# Patient Record
Sex: Male | Born: 1935
Health system: Southern US, Community
[De-identification: ages and names within clinical notes are randomized; demographics above are authoritative.]

## PROBLEM LIST (undated history)

## (undated) DIAGNOSIS — E119 Type 2 diabetes mellitus without complications: Secondary | ICD-10-CM

## (undated) DIAGNOSIS — E785 Hyperlipidemia, unspecified: Secondary | ICD-10-CM

## (undated) DIAGNOSIS — I10 Essential (primary) hypertension: Secondary | ICD-10-CM

## (undated) DIAGNOSIS — R600 Localized edema: Secondary | ICD-10-CM

## (undated) DIAGNOSIS — I2510 Atherosclerotic heart disease of native coronary artery without angina pectoris: Secondary | ICD-10-CM

## (undated) DIAGNOSIS — I5032 Chronic diastolic (congestive) heart failure: Secondary | ICD-10-CM

## (undated) DIAGNOSIS — I639 Cerebral infarction, unspecified: Secondary | ICD-10-CM

## (undated) DIAGNOSIS — M4800 Spinal stenosis, site unspecified: Secondary | ICD-10-CM

## (undated) DIAGNOSIS — E039 Hypothyroidism, unspecified: Secondary | ICD-10-CM

## (undated) HISTORY — PX: HEMORRHOID SURGERY: SHX153

## (undated) HISTORY — PX: APPENDECTOMY: SHX54

## (undated) HISTORY — DX: Hypothyroidism, unspecified: E03.9

## (undated) HISTORY — PX: TOE SURGERY: SHX1073

## (undated) HISTORY — DX: Chronic diastolic (congestive) heart failure: I50.32

## (undated) HISTORY — DX: Type 2 diabetes mellitus without complications: E11.9

## (undated) HISTORY — PX: CHOLECYSTECTOMY: SHX55

## (undated) HISTORY — DX: Essential (primary) hypertension: I10

## (undated) HISTORY — DX: Spinal stenosis, site unspecified: M48.00

## (undated) HISTORY — PX: PROSTATE BIOPSY: SHX241

## (undated) HISTORY — PX: CORNEAL TRANSPLANT: SHX108

## (undated) HISTORY — DX: Localized edema: R60.0

## (undated) HISTORY — DX: Hyperlipidemia, unspecified: E78.5

## (undated) HISTORY — DX: Atherosclerotic heart disease of native coronary artery without angina pectoris: I25.10

---

## 1992-11-21 HISTORY — PX: CORONARY ARTERY BYPASS GRAFT: SHX141

## 2004-10-20 ENCOUNTER — Ambulatory Visit: Payer: Self-pay | Admitting: *Deleted

## 2004-12-29 ENCOUNTER — Ambulatory Visit: Payer: Self-pay

## 2004-12-31 ENCOUNTER — Ambulatory Visit: Payer: Self-pay

## 2005-01-06 ENCOUNTER — Ambulatory Visit: Payer: Self-pay | Admitting: *Deleted

## 2005-03-08 ENCOUNTER — Ambulatory Visit: Payer: Self-pay | Admitting: Gastroenterology

## 2005-05-05 ENCOUNTER — Ambulatory Visit: Payer: Self-pay | Admitting: Gastroenterology

## 2005-10-19 ENCOUNTER — Ambulatory Visit: Payer: Self-pay | Admitting: *Deleted

## 2005-10-26 ENCOUNTER — Ambulatory Visit: Payer: Self-pay | Admitting: *Deleted

## 2005-11-03 ENCOUNTER — Ambulatory Visit: Payer: Self-pay

## 2007-03-05 ENCOUNTER — Ambulatory Visit: Payer: Self-pay | Admitting: Unknown Physician Specialty

## 2007-03-22 ENCOUNTER — Ambulatory Visit: Payer: Self-pay | Admitting: Unknown Physician Specialty

## 2007-04-22 ENCOUNTER — Ambulatory Visit: Payer: Self-pay | Admitting: Unknown Physician Specialty

## 2007-04-25 ENCOUNTER — Ambulatory Visit: Payer: Self-pay | Admitting: *Deleted

## 2007-05-24 ENCOUNTER — Ambulatory Visit: Payer: Self-pay | Admitting: Urology

## 2007-07-16 ENCOUNTER — Ambulatory Visit: Payer: Self-pay | Admitting: Cardiology

## 2007-07-25 ENCOUNTER — Ambulatory Visit: Payer: Self-pay | Admitting: Ophthalmology

## 2007-07-27 ENCOUNTER — Ambulatory Visit: Payer: Self-pay | Admitting: Ophthalmology

## 2007-11-08 ENCOUNTER — Ambulatory Visit: Payer: Self-pay

## 2007-12-24 ENCOUNTER — Ambulatory Visit: Payer: Self-pay | Admitting: Cardiology

## 2008-10-21 ENCOUNTER — Ambulatory Visit: Payer: Self-pay | Admitting: Orthopedic Surgery

## 2008-10-28 ENCOUNTER — Ambulatory Visit: Payer: Self-pay | Admitting: Orthopedic Surgery

## 2009-01-06 ENCOUNTER — Ambulatory Visit: Payer: Self-pay | Admitting: Cardiology

## 2009-04-02 ENCOUNTER — Encounter: Payer: Self-pay | Admitting: Cardiology

## 2009-05-05 ENCOUNTER — Ambulatory Visit: Payer: Self-pay | Admitting: Vascular Surgery

## 2009-05-21 ENCOUNTER — Ambulatory Visit: Payer: Self-pay | Admitting: Family Medicine

## 2009-06-07 DIAGNOSIS — I10 Essential (primary) hypertension: Secondary | ICD-10-CM

## 2009-06-07 DIAGNOSIS — I2510 Atherosclerotic heart disease of native coronary artery without angina pectoris: Secondary | ICD-10-CM | POA: Insufficient documentation

## 2009-06-07 DIAGNOSIS — E785 Hyperlipidemia, unspecified: Secondary | ICD-10-CM

## 2009-06-07 DIAGNOSIS — I25810 Atherosclerosis of coronary artery bypass graft(s) without angina pectoris: Secondary | ICD-10-CM

## 2009-06-09 ENCOUNTER — Ambulatory Visit: Payer: Self-pay | Admitting: Cardiology

## 2009-06-09 DIAGNOSIS — R0602 Shortness of breath: Secondary | ICD-10-CM

## 2009-06-17 ENCOUNTER — Encounter: Payer: Self-pay | Admitting: Cardiology

## 2009-06-17 ENCOUNTER — Ambulatory Visit: Payer: Self-pay | Admitting: Internal Medicine

## 2009-06-17 ENCOUNTER — Ambulatory Visit: Payer: Self-pay | Admitting: Cardiology

## 2009-06-23 ENCOUNTER — Telehealth: Payer: Self-pay | Admitting: Cardiology

## 2010-10-06 ENCOUNTER — Encounter: Payer: Self-pay | Admitting: Cardiology

## 2011-04-05 NOTE — Assessment & Plan Note (Signed)
[facility] OFFICE NOTE   NAME:Boudreau, BILLY TURVEY                    MRN:          [id number]  DATE:01/06/2009                            DOB:          06/19/1925    Mr. Hodgkin comes in today for followup.  He has no complaints  including no angina, ischemic equivalent symptoms, orthopnea, PND, tachy  palpitations, syncope, or presyncope.   PROBLEM LIST:  1. Coronary artery disease.  He is status post coronary artery bypass      grafting by Dr. Particia Lather in 1994.  He has a stress Myoview on      November 08, 2007, which showed a ejection fraction of 60% with no      ischemia.  2. Hypertension.  3. Hyperlipidemia.  4. Type 2 diabetes.  5. Hypothyroidism, followed by Dr. Sullivan Lone.   He has a history of asymptomatic sinus bradycardia.  His last heart rate  is being 48 in February 2009.   CURRENT MEDICATIONS:  1. Metoprolol tartrate 25 mg p.o. b.i.d.  2. Levothyroxine 25 mcg per day.  3. Vitamin D 50,000 units every week.  4. Lisinopril 40 mg a day.  5. Aspirin 81 mg a day.  6. Lantus insulin nightly.  7. Insulin NovoLog t.i.d.  8. Omeprazole 20 mg daily.  9. HCTZ 12.5 mg per day.  10.Zocor 40 mg nightly.   PHYSICAL EXAMINATION:  VITAL SIGNS:  His blood pressure is 162/70.  His  pulse is 53.  His weight is 210 up 10.  HEENT:  Essentially unremarkable.  NECK:  Supple.  Carotid upstrokes were equal bilateral without bruits.  No JVD.  Thyroid is not enlarged.  Trachea is midline.  LUNGS:  Clear to auscultation and percussion.  Thoracotomy is stable.  HEART:  Soft S1 and S2.  No murmur, rub, or gallop.  ABDOMEN:  Soft.  Good bowel sounds.  No midline bruit.  No pulsatile  mass.  EXTREMITIES:  No edema.  Pulses are present.  NEUROLOGIC:  Intact.   He is in a sinus brady.  He has a left anterior fascicular block and  LVH, which is unchanged.   ASSESSMENT AND PLAN:  Mr. Puskas is stable from my  standpoint.  We  have made no change in medical program.  He will need to watch his blood  pressure.  I will see him back in a year at which time we will probably  obtain objective assessment of his coronary artery disease and his  previous surgery.     Thomas C. Daleen Squibb, MD, [facility]  Electronically Signed    TCW/MedQ  DD: 01/06/2009  DT: 01/07/2009  Job #: [id number]   cc:   Julieanne Manson

## 2011-04-05 NOTE — Assessment & Plan Note (Signed)
[facility] OFFICE NOTE   NAME:Rundle, DEMETRICK EICHENBERGER                    MRN:          [id number]  DATE:07/16/2007                            DOB:          1925/08/02    Mr. Hehn comes today, referred by Dr. Sullivan Lone, for leg discomfort  and fatigue.   He has been a former patient of Dr. Glennon Hamilton.  His past medical  history is significant for coronary artery bypass grafting in 1994 by  Dr. Particia Lather.  At that time, he had eight grafts placed.  The  graft placements are outlined on the previous note of December 29, 2004  by [employer], PA-C.   He has type II diabetes and recently went on insulin.  He also has  hyperlipidemia and hypertension.  Blood work was recently checked by Dr.  Sullivan Lone.  He is on an excellent medical program.   His leg discomfort is described as just aching all over.  He has been  walking, which does not exacerbate it.  He recently had an evaluation by  Dr. Jenene Slicker, which did not show any significant circulation or PAD  issues.  Dr. Sullivan Lone has confirmed this by phone today.   He hurts mostly with a burning and aching in the middle part of his  thighs.   CURRENT MEDICATIONS:  1. Toprol XL 50 mg a day.  2. Zocor 40 mg nightly.  3. HCTZ 12.5 mg daily.  4. Omeprazole 20 mg a day.  5. Insulin NovoLog t.i.d.  6. Lantus insulin nightly.  7. Altace 10 mg a day.  8. Aspirin 81 mg a day.   PHYSICAL EXAMINATION:  His blood pressure is 142/60.  His pulse 56 and  regular.  His weight is 200 pounds, up 3.  HEENT:  Normocephalic and atraumatic.  PERRLA.  Extraocular movements  are intact.  Sclerae are clear.  Facial symmetry is normal.  NECK:  Carotid upstrokes are equal bilaterally without bruits.  No JVD.  Thyroid is not enlarged.  Trachea is midline.  HEART:  A nondisplaced PMI.  Normal S1 and S2.  LUNGS:  Clear.  ABDOMEN:  Protuberant with good bowel sounds.  There is no  hepatomegaly.  EXTREMITIES:  No clubbing, cyanosis or edema.  Pulses are present.  NEUROLOGIC:  Intact.   I went over carefully any ischemic symptoms he might be having.  I do  not get the feeling that he is having any of that at present.  His  stress Myoview last was November 03, 2005, which showed an EF of 58%  with no sign of scar or ischemia.   ASSESSMENT/PLAN:  1. Neuralgia paresthetica.  2. No history of or symptoms suggestive of peripheral vascular      disease.  3. Coronary artery disease, currently stable.  4. Insulin-dependent diabetes.  5. Hypertension.  6. Hyperlipidemia.   I have encouraged Mr. Gover to lose weight and to tightly control his  blood sugar.  I have also encouraged regular exercise.  I reassured him  that his peripheral vasculature is in good shape.  Will set  him up for a  stress Myoview late this year for a two-year followup.  I will see him  back in January, 2009.     Thomas C. Daleen Squibb, MD, [facility]  Electronically Signed    TCW/MedQ  DD: 07/16/2007  DT: 07/16/2007  Job #: [id number]   cc:   Julieanne Manson

## 2011-04-05 NOTE — Assessment & Plan Note (Signed)
[facility] OFFICE NOTE   NAME:Vancuren, WHITFIELD DULAY                    MRN:          [id number]  DATE:12/24/2007                            DOB:          Oct 11, 1925    Mr. Crossen returns today for further management of his coronary  disease.   Had a stress Myoview November 08, 2007, which showed an EF of 60% with  no ischemia.  His grafts are now nearly 75 years old.  At that time he  had 8 grafts placed by Dr. Andrey Campanile.   He is having no symptoms of ischemia or angina.  His blood pressure,  lipids, and blood sugar all been followed by Dr. Sullivan Lone.   His blood pressure has been good at home.  Today he is up to 166/70,  pulse is about 60 and regular.  HEENT:  Unchanged.  Carotids are full without bruits.  Thyroid is not enlarged.  Trachea is  midline.  Lungs were clear.  HEART:  Reveals a nondisplaced PMI.  Normal S1, S2.  Slow rate.  Abdominal exam is soft, good bowel sounds.  No midline bruit.  EXTREMITIES:  No edema.  Pulses are intact.   He has EKG showing marked sinus bradycardia, rate of 48 beats per  minute.  Left anterior fascicular block.  He has LVH with strain and  repolarization changes.  This is stable.   Mr. Statz is doing well.  He becomes more bradycardic we could cut  his Toprol back 25 mg a day.  At the present time will leave him where  he is since he is doing so well.  I will see him back again in December  2009.  With his grafts being as old as they are he should probably have  an annual surveillance nuclear study.  We will address this when he  returns.     Thomas C. Daleen Squibb, MD, [facility]  Electronically Signed    TCW/MedQ  DD: 12/24/2007  DT: 12/24/2007  Job #: [id number]   cc:   Julieanne Manson

## 2011-04-05 NOTE — Letter (Signed)
April 25, 2007    Julieanne Manson  [address], [address]  [place],  [place] [address]   RE:  DUELL, HOLDREN  MRN:  [id number]  /  DOB:  13-Nov-1925   Dear Louanne Skye:   It was a pleasure to see your nice patient, Joseph [name], for  followup on April 25, 2007.   As you know, he is a very pleasant 77 year old white male with coronary  artery disease 14 years post coronary artery bypass graft surgery by Dr.  Andrey Campanile. The patient has diabetes, hyperlipidemia, and hypertension. He  has been getting along quite well without cardiac symptoms. Most recent  stress Myoview was November 03, 2005, there was no evidence of scar or  ischemia. He had previous abdominal ultrasound revealing no abdominal  aneurysm.   His present medications include insulin, omeprazole 20, aspirin 325,  hydrochlorothiazide 12.5, Zocor 40, Toprol XL 50, Altace 10 and  metformin.   Blood pressure was 155/67, pulse 62 in normal sinus rhythm.  GENERAL APPEARANCE: Normal. JVP is not elevated. Carotid pulse palpable  and equal without bruits.  LUNGS:  Clear.  CARDIAC: Normal. No murmur, gallop.  ABDOMEN: Normal.  EXTREMITIES: Normal.   I have suggested increasing the Altace to 20 mg daily. We will plan to  follow a BMP and lipid and LFTs and I will have him followup with Dr.  Daleen Squibb in four months in the [place] office. I should note the EKG  reveals sinus bradycardia, left anterior fascicular block. Thank you for  the opportunity to share in this nice pleasant gentleman's care.    Sincerely,      E. Graceann Congress, MD, [facility]  Electronically Signed    EJL/MedQ  DD: 04/25/2007  DT: 04/25/2007  Job #: [id number]

## 2011-05-09 ENCOUNTER — Encounter: Payer: Self-pay | Admitting: Cardiology

## 2011-08-24 ENCOUNTER — Encounter: Payer: Self-pay | Admitting: Cardiovascular Disease

## 2011-09-05 ENCOUNTER — Encounter: Payer: Self-pay | Admitting: Cardiovascular Disease

## 2011-09-07 ENCOUNTER — Institutional Professional Consult (permissible substitution): Payer: Self-pay | Admitting: Cardiovascular Disease

## 2011-09-14 ENCOUNTER — Ambulatory Visit (INDEPENDENT_AMBULATORY_CARE_PROVIDER_SITE_OTHER): Payer: Medicare Other | Admitting: Cardiovascular Disease

## 2011-09-14 ENCOUNTER — Telehealth: Payer: Self-pay | Admitting: *Deleted

## 2011-09-14 ENCOUNTER — Encounter: Payer: Self-pay | Admitting: *Deleted

## 2011-09-14 ENCOUNTER — Encounter: Payer: Self-pay | Admitting: Cardiovascular Disease

## 2011-09-14 DIAGNOSIS — E119 Type 2 diabetes mellitus without complications: Secondary | ICD-10-CM

## 2011-09-14 DIAGNOSIS — I25810 Atherosclerosis of coronary artery bypass graft(s) without angina pectoris: Secondary | ICD-10-CM

## 2011-09-14 DIAGNOSIS — E785 Hyperlipidemia, unspecified: Secondary | ICD-10-CM

## 2011-09-14 DIAGNOSIS — R5383 Other fatigue: Secondary | ICD-10-CM

## 2011-09-14 DIAGNOSIS — I10 Essential (primary) hypertension: Secondary | ICD-10-CM

## 2011-09-14 DIAGNOSIS — R0602 Shortness of breath: Secondary | ICD-10-CM

## 2011-09-14 NOTE — Telephone Encounter (Signed)
Pt's wife stated that he has not been taking either Crestor or Simvastatin. They were told to call back to clarify, so that Dr. Mariah Milling could send in Rx that he recommended to [facility] pharmacy on [address].

## 2011-09-14 NOTE — Patient Instructions (Addendum)
Please decrease the metoprolol to 1/2 twice a day  Your physician has requested that you have a lexiscan myoview. For further information please visit [email or web address] Please follow instruction sheet, as given.  Please call us if you have new issues that need to be addressed before your next appt.  The office will contact you for a follow up Appt. In 1 months

## 2011-09-14 NOTE — Progress Notes (Signed)
   Patient ID: Joseph [name], male    DOB: 09-18-1925, 77 y.o.   MRN: [id number]  HPI Comments: Joseph [name] with a hx of CAD, CABG in 2006, PAD with reported stenosis in the past requiring intervention (details unavailable), obesity, hyperlipidemia, arthritis of the knees, who presents by referral from Dr. Sullivan Lone for increasing fatigue, SOB, leg weakness over the past 6 months.  He reports that he is unable to do anything without having to rest. He can only walk a short distance. Prior to hiss in 2006, he was having heaviness in the chest. He is not having this heaviness currently. His weight has significantly increased. His wife reports that he is unable to do much around the house. He does a small chore  "and then has to sit down". "My legs are very heavy" and breathing is short.  EKG shows sinus bradycardia rate 48 beats per minute with left anterior fascicular block, poor R-wave progression through the anterior precordial leads, nonspecific ST abnormality, possible LVH with repolarization abn     Review of Systems  Constitutional: Positive for fatigue.  HENT: Negative.   Eyes: Negative.   Respiratory: Positive for shortness of breath.   Cardiovascular: Negative.   Gastrointestinal: Negative.   Musculoskeletal: Negative.        Legs are heavy  Skin: Negative.   Neurological: Positive for weakness.  Hematological: Negative.   Psychiatric/Behavioral: Negative.   All other systems reviewed and are negative.      Physical Exam  Nursing note and vitals reviewed. Constitutional: He is oriented to person, place, and time. He appears well-developed and well-nourished.  HENT:  Head: Normocephalic.  Nose: Nose normal.  Mouth/Throat: Oropharynx is clear and moist.  Eyes: Conjunctivae are normal. Pupils are equal, round, and reactive to light.  Neck: Normal range of motion. Neck supple. No JVD present.  Cardiovascular: Normal rate, regular rhythm, S1 normal, S2 normal, normal heart  sounds and intact distal pulses.  Exam reveals decreased pulses. Exam reveals no gallop and no friction rub.   No murmur heard. Pulses:      Carotid pulses are 2+ on the right side, and 2+ on the left side.      Radial pulses are 2+ on the right side, and 2+ on the left side.       Dorsalis pedis pulses are 0 on the right side, and 0 on the left side.       Posterior tibial pulses are 0 on the right side, and 0 on the left side.  Pulmonary/Chest: Effort normal and breath sounds normal. No respiratory distress. He has no wheezes. He has no rales. He exhibits no tenderness.  Abdominal: Soft. Bowel sounds are normal. He exhibits no distension. There is no tenderness.  Musculoskeletal: Normal range of motion. He exhibits no edema and no tenderness.  Lymphadenopathy:    He has no cervical adenopathy.  Neurological: He is alert and oriented to person, place, and time. Coordination normal.  Skin: Skin is warm and dry. No rash noted. No erythema.  Psychiatric: He has a normal mood and affect. His behavior is normal. Judgment and thought content normal.           Assessment and Plan

## 2011-09-15 DIAGNOSIS — E1151 Type 2 diabetes mellitus with diabetic peripheral angiopathy without gangrene: Secondary | ICD-10-CM | POA: Insufficient documentation

## 2011-09-15 DIAGNOSIS — R5383 Other fatigue: Secondary | ICD-10-CM | POA: Insufficient documentation

## 2011-09-15 DIAGNOSIS — R531 Weakness: Secondary | ICD-10-CM | POA: Insufficient documentation

## 2011-09-15 NOTE — Assessment & Plan Note (Addendum)
Uncertain if his fatigue and shortness of breath his underlying cardiac issues. Other options would include his low testosterone, the increase in weight and general deconditioning, Depression or worsening claudication in his legs. We will discuss with him in followup whether he needs a lower extremity Doppler.

## 2011-09-15 NOTE — Telephone Encounter (Signed)
Do we have samples of vytorin 10/40 he could try? With a script

## 2011-09-15 NOTE — Assessment & Plan Note (Signed)
Blood pressure is well controlled on today's visit. No changes made to the medications. 

## 2011-09-15 NOTE — Assessment & Plan Note (Signed)
No recent evaluation. Stress test has been ordered. Would suggest continued aggressive medical management.

## 2011-09-15 NOTE — Assessment & Plan Note (Signed)
Hemoglobin A1c 7.1.  We have encouraged continued exercise, careful diet management in an effort to lose weight.

## 2011-09-15 NOTE — Assessment & Plan Note (Signed)
Given his history of coronary artery disease, I'm concerned about his shortness of breath and fatigue. We will schedule him for a Myoview at [facility]. If this is unrevealing, we could have him complete an echocardiogram to exclude elevated right ventricular systolic pressures and the need for diuresis.

## 2011-09-15 NOTE — Telephone Encounter (Signed)
Notified pt's wife of msg below. Pt will start Vytorin 10/40 samples and call us in about a week with update. If he tolerates, we will send in Rx.

## 2011-09-15 NOTE — Assessment & Plan Note (Signed)
Current cholesterol is elevated and he has stopped taking his statin. He was changed from simvastatin 80 mg daily secondary to knee pain to Crestor. He continued to have knee pain and now does not take any cholesterol medication. One option would be to try Vytorin 10/40 mg daily .

## 2011-09-22 ENCOUNTER — Ambulatory Visit: Payer: Self-pay | Admitting: Cardiovascular Disease

## 2011-09-22 DIAGNOSIS — R072 Precordial pain: Secondary | ICD-10-CM

## 2011-09-27 ENCOUNTER — Telehealth: Payer: Self-pay | Admitting: *Deleted

## 2011-09-27 DIAGNOSIS — I25810 Atherosclerosis of coronary artery bypass graft(s) without angina pectoris: Secondary | ICD-10-CM

## 2011-09-27 DIAGNOSIS — R0602 Shortness of breath: Secondary | ICD-10-CM

## 2011-09-27 NOTE — Telephone Encounter (Signed)
Error

## 2011-10-03 ENCOUNTER — Encounter: Payer: Self-pay | Admitting: Cardiovascular Disease

## 2011-10-06 ENCOUNTER — Encounter: Payer: Self-pay | Admitting: Cardiovascular Disease

## 2011-10-07 ENCOUNTER — Telehealth: Payer: Self-pay | Admitting: *Deleted

## 2011-10-07 NOTE — Telephone Encounter (Signed)
Pt notified of myoview results, low risk scan overall normal. Pt has f/u with Dr. Mariah Milling 10/17/11. After discussing w/ pt, he asks if that f/u is not needed he may want to move back b/c his breathing he thinks has improved. I told him that per note if myoview normal TG recc echo to r/o reason for dyspnea. He says if Dr. Mariah Milling does not think this is urgent since dysp improved, he would like to hold off. Dr. Sullivan Lone is going to supplement testosterone due to low findings, this may help with his fatigue. I told pt I will put him on 6 mo recall and will forward to TG to see if he has further rec's, otherwise pt will call with any changes.

## 2011-10-09 NOTE — Telephone Encounter (Signed)
Can delay echo if SOB improving. Routine follow up 6 months. Testosterone may help

## 2011-10-10 NOTE — Telephone Encounter (Signed)
Spoke to pt's wife, notified of below. Echo and f/u on recall for 6 months. If pt has no SOB, he will not choose to do echo, will still f/u in 6 months.

## 2011-10-17 ENCOUNTER — Ambulatory Visit: Payer: Medicare Other | Admitting: Cardiovascular Disease

## 2012-06-20 ENCOUNTER — Ambulatory Visit: Payer: Self-pay | Admitting: Family Medicine

## 2012-06-21 ENCOUNTER — Encounter: Payer: Self-pay | Admitting: Cardiovascular Disease

## 2012-06-21 ENCOUNTER — Ambulatory Visit (INDEPENDENT_AMBULATORY_CARE_PROVIDER_SITE_OTHER): Payer: Medicare Other | Admitting: Cardiovascular Disease

## 2012-06-21 VITALS — BP 152/68 | HR 49 | Ht 67.0 in | Wt 220.5 lb

## 2012-06-21 DIAGNOSIS — I10 Essential (primary) hypertension: Secondary | ICD-10-CM

## 2012-06-21 DIAGNOSIS — E119 Type 2 diabetes mellitus without complications: Secondary | ICD-10-CM

## 2012-06-21 DIAGNOSIS — R002 Palpitations: Secondary | ICD-10-CM

## 2012-06-21 DIAGNOSIS — R609 Edema, unspecified: Secondary | ICD-10-CM | POA: Insufficient documentation

## 2012-06-21 DIAGNOSIS — E785 Hyperlipidemia, unspecified: Secondary | ICD-10-CM

## 2012-06-21 DIAGNOSIS — R0602 Shortness of breath: Secondary | ICD-10-CM

## 2012-06-21 DIAGNOSIS — I25810 Atherosclerosis of coronary artery bypass graft(s) without angina pectoris: Secondary | ICD-10-CM

## 2012-06-21 DIAGNOSIS — R000 Tachycardia, unspecified: Secondary | ICD-10-CM

## 2012-06-21 MED ORDER — POTASSIUM CHLORIDE ER 10 MEQ PO TBCR: 10.0000 meq | EXTENDED_RELEASE_TABLET | Freq: Every day | ORAL | Status: DC | PRN

## 2012-06-21 NOTE — Patient Instructions (Addendum)
You are doing well.  Please start the lasix one a day Take a potassium pill with the lasix  We will schedule you an echocardiogram to look at the heart and reason for edema  Please call us if you have new issues that need to be addressed before your next appt.  Your physician wants you to follow-up in: 4 to 5 weeks

## 2012-06-21 NOTE — Assessment & Plan Note (Signed)
Pitting edema noted. We have suggested he have an echocardiogram. Uncertain if in the setting of new EKG changes, whether this is systolic or diastolic CHF or a combination of both. We have certainly recommended he continue with Dr. Wonda Olds advice and start Lasix daily. We will add potassium 10 mEq a day, though he could certainly substitute with a banana which he frequently does. We have suggested he decrease the frequency of the Lasix once his edema improves.

## 2012-06-21 NOTE — Assessment & Plan Note (Signed)
Recheck of his blood pressure today was improved. Diuresis should also help with blood pressure.

## 2012-06-21 NOTE — Progress Notes (Signed)
Patient ID: Joseph [name], male    DOB: July 12, 1925, 78 y.o.   MRN: [id number]  HPI Comments: Joseph [name] with a hx of CAD, CABG in 2006, PAD with reported stenosis in the past requiring intervention (details unavailable),  followed by Dr.  Wyn Quaker, obesity, hyperlipidemia, arthritis of the knees, who previously presented with increasing fatigue, SOB, leg weakness, now presenting with lower extremity edema.  He is a patient of Dr. Sullivan Lone.   He reports he was seen yesterday by Dr. Sullivan Lone and was started on Lasix. He has not filled the medication yet. He reports having worsening lower extremity edema over the past month. Chest x-ray did not show CHF or pneumonia. Lab work done showed normal renal function and LFTs. He does have hemoglobin A1c 7.8, total cholesterol 201 His wife reports that he has not been taking his cholesterol medication.  He has noticed it for the putting his shoes on. He has not been eating anything different, no salt or heavy fluid intake. In fact his wife reports that he does not drink very much, only half a glass of ice tea or coffee at a time. He has been having sputum, coughing at nighttime and is using 3 pillows to sleep. He denies any worsening shortness of breath or chest pain. His wife reports that he is not very active at baseline.  EKG shows sinus bradycardia rate 49 beats per minute with left anterior fascicular block, poor R-wave progression through the anterior precordial leads, nonspecific ST abnormality in leads V4 through V6 (more prominent than in October 2012)   Outpatient Encounter Prescriptions as of 06/21/2012  Medication Sig Dispense Refill  . aspirin (ASPIR-81) 81 MG EC tablet Take 81 mg by mouth daily.        . furosemide (LASIX) 20 MG tablet Take 20 mg by mouth daily.      . hydrochlorothiazide (HYDRODIURIL) 50 MG tablet Take 50 mg by mouth daily.        . insulin aspart (NOVOLOG) 100 UNIT/ML injection Inject into the skin 3 (three) times daily before  meals. 26-16-28       . insulin glargine (LANTUS) 100 UNIT/ML injection Inject 60 Units into the skin at bedtime.       . IRON CR PO Take 65 mg by mouth at bedtime.      . Levothyroxine Sodium 25 MCG CAPS Take by mouth daily.        Marland Kitchen lisinopril (PRINIVIL,ZESTRIL) 40 MG tablet Take 40 mg by mouth 2 (two) times daily.        . metoprolol tartrate (LOPRESSOR) 25 MG tablet Take 25 mg by mouth 2 (two) times daily.      Marland Kitchen omeprazole (PRILOSEC) 20 MG capsule Take 20 mg by mouth daily.        Marland Kitchen ezetimibe-simvastatin (VYTORIN) 10-40 MG per tablet Take 1 tablet by mouth at bedtime.  30 tablet  6  . potassium chloride (K-DUR) 10 MEQ tablet Take 1 tablet (10 mEq total) by mouth daily as needed. Take with lasix  30 tablet  6    Review of Systems  HENT: Negative.   Eyes: Negative.   Cardiovascular: Positive for leg swelling.  Gastrointestinal: Negative.   Musculoskeletal: Negative.        Legs are heavy  Skin: Negative.   Hematological: Negative.   Psychiatric/Behavioral: Negative.   All other systems reviewed and are negative.    BP 152/68  Pulse 49  Ht 5\' 7"  (1.702 m)  Wt  220 lb 8 oz (100.018 kg)  BMI 34.54 kg/m2  Physical Exam  Nursing note and vitals reviewed. Constitutional: He is oriented to person, place, and time. He appears well-developed and well-nourished.        obese  HENT:  Head: Normocephalic.  Nose: Nose normal.  Mouth/Throat: Oropharynx is clear and moist.  Eyes: Conjunctivae are normal. Pupils are equal, round, and reactive to light.  Neck: Normal range of motion. Neck supple. No JVD present.  Cardiovascular: Normal rate, regular rhythm, S1 normal, S2 normal, normal heart sounds and intact distal pulses.  Exam reveals decreased pulses. Exam reveals no gallop and no friction rub.   No murmur heard. Pulses:      Carotid pulses are 2+ on the right side, and 2+ on the left side.      Radial pulses are 2+ on the right side, and 2+ on the left side.       Dorsalis pedis  pulses are 0 on the right side, and 0 on the left side.       Posterior tibial pulses are 0 on the right side, and 0 on the left side.       1+ pitting edema to the mid shins bilaterally  Pulmonary/Chest: Effort normal and breath sounds normal. No respiratory distress. He has no wheezes. He has no rales. He exhibits no tenderness.  Abdominal: Soft. Bowel sounds are normal. He exhibits no distension. There is no tenderness.  Musculoskeletal: Normal range of motion. He exhibits no edema and no tenderness.  Lymphadenopathy:    He has no cervical adenopathy.  Neurological: He is alert and oriented to person, place, and time. Coordination normal.  Skin: Skin is warm and dry. No rash noted. No erythema.  Psychiatric: He has a normal mood and affect. His behavior is normal. Judgment and thought content normal.           Assessment and Plan

## 2012-06-21 NOTE — Assessment & Plan Note (Signed)
Hemoglobin A1c 7.8. We have suggested he work on weight loss, exercise. May need additional medication.

## 2012-06-21 NOTE — Assessment & Plan Note (Signed)
CABG in 1994. He reports having 8 bypasses. New EKG changes with lower extremity edema. He does not want a repeat stress test as he did have one at the end of 2012. Echocardiogram pending to look for wall motion abnormality, valvular disease, right ventricular systolic pressures to help guide diuresis.

## 2012-06-21 NOTE — Assessment & Plan Note (Signed)
He is currently not taking a cholesterol medication. He reports that Dr. Sullivan Lone was going to restart his statin.

## 2012-06-22 ENCOUNTER — Encounter: Payer: Self-pay | Admitting: Cardiovascular Disease

## 2012-06-25 ENCOUNTER — Other Ambulatory Visit (INDEPENDENT_AMBULATORY_CARE_PROVIDER_SITE_OTHER): Payer: Medicare Other

## 2012-06-25 ENCOUNTER — Other Ambulatory Visit: Payer: Self-pay

## 2012-06-25 DIAGNOSIS — R0602 Shortness of breath: Secondary | ICD-10-CM

## 2012-06-25 DIAGNOSIS — R000 Tachycardia, unspecified: Secondary | ICD-10-CM

## 2012-06-25 DIAGNOSIS — R0989 Other specified symptoms and signs involving the circulatory and respiratory systems: Secondary | ICD-10-CM

## 2012-06-25 DIAGNOSIS — R609 Edema, unspecified: Secondary | ICD-10-CM

## 2012-06-25 DIAGNOSIS — R002 Palpitations: Secondary | ICD-10-CM

## 2012-07-05 ENCOUNTER — Telehealth: Payer: Self-pay | Admitting: *Deleted

## 2012-07-05 NOTE — Telephone Encounter (Signed)
Anastasia from Dr. Elisabeth Cara office calls today for a sooner appt for pt. Pt is concerned that his rhythm changes were not addressed when he received the results of his ECHO. Discussed with her the complete findings of the ECHO and last office visit. Appt made for 07/13/12 with Dr. Mariah Milling. She will call pt with appt. Mylo Red RN

## 2012-07-12 ENCOUNTER — Telehealth: Payer: Self-pay

## 2012-07-12 NOTE — Telephone Encounter (Signed)
Pt called back. He says he is already taking metoprolol 12.5 mg BID.  His HR today is 46 BPM.  I advised him to go ahead and stop metoprolol and see if this helps bring HR. If not, he should give [place] a call. Otherwise he will keep appt for 9/4.  Understanding verb

## 2012-07-13 ENCOUNTER — Ambulatory Visit: Payer: Medicare Other | Admitting: Cardiovascular Disease

## 2012-07-22 HISTORY — PX: CARDIAC CATHETERIZATION: SHX172

## 2012-07-25 ENCOUNTER — Ambulatory Visit (INDEPENDENT_AMBULATORY_CARE_PROVIDER_SITE_OTHER): Payer: Medicare Other | Admitting: Cardiovascular Disease

## 2012-07-25 ENCOUNTER — Encounter: Payer: Self-pay | Admitting: Cardiovascular Disease

## 2012-07-25 VITALS — BP 140/60 | HR 69 | Ht 66.0 in | Wt 219.5 lb

## 2012-07-25 DIAGNOSIS — E785 Hyperlipidemia, unspecified: Secondary | ICD-10-CM

## 2012-07-25 DIAGNOSIS — E119 Type 2 diabetes mellitus without complications: Secondary | ICD-10-CM

## 2012-07-25 DIAGNOSIS — I10 Essential (primary) hypertension: Secondary | ICD-10-CM

## 2012-07-25 DIAGNOSIS — I25810 Atherosclerosis of coronary artery bypass graft(s) without angina pectoris: Secondary | ICD-10-CM

## 2012-07-25 MED ORDER — POTASSIUM CHLORIDE ER 10 MEQ PO TBCR: 10.0000 meq | EXTENDED_RELEASE_TABLET | Freq: Every day | ORAL | Status: DC | PRN

## 2012-07-25 MED ORDER — FUROSEMIDE 20 MG PO TABS: 20.0000 mg | ORAL_TABLET | Freq: Every day | ORAL | Status: DC

## 2012-07-25 NOTE — Assessment & Plan Note (Signed)
Etiology of his shortness of breath and fatigue is uncertain. Previous stress test again at 2012 did not show ischemia. Given his continued symptoms, we did offer cardiac catheterization. He would like to think about this and when he would like to do it. He is going to [place] in 2 weeks and uncertain if he would like to do this prior to [place] or possibly when he arrives home. His wife has urged him to schedule this ASAP as he has been symptomatic. We will wait to hear from him.

## 2012-07-25 NOTE — Assessment & Plan Note (Signed)
We have recommended he stay on his Vytorin.

## 2012-07-25 NOTE — Assessment & Plan Note (Signed)
Blood pressure is well controlled on today's visit. No changes made to the medications. 

## 2012-07-25 NOTE — Patient Instructions (Addendum)
You are doing well. No medication changes were made.  Call me when you would like to have the catheterization  Please call us if you have new issues that need to be addressed before your next appt.  Your physician wants you to follow-up in: 6 months.  You will receive a reminder letter in the mail two months in advance. If you don't receive a letter, please call our office to schedule the follow-up appointment.

## 2012-07-25 NOTE — Assessment & Plan Note (Signed)
We have encouraged continued exercise, careful diet management in an effort to lose weight. 

## 2012-07-25 NOTE — Progress Notes (Signed)
Patient ID: Joseph [name], male    DOB: Dec 25, 1924, 78 y.o.   MRN: [id number]  HPI Comments: Joseph [name] with a hx of CAD, CABG in 2006, PAD with reported stenosis in the past requiring intervention (details unavailable),  followed by Dr.  Wyn Quaker, obesity, hyperlipidemia, arthritis of the knees, who previously presented with increasing fatigue, SOB, leg weakness,  presenting previously with lower extremity edema, now presenting for routine followup.  He is a patient of Dr. Sullivan Lone.  He reports that his edema has improved with Lasix.  Previous  Chest x-ray did not show CHF or pneumonia. Lab work done showed normal renal function and LFTs. He does have hemoglobin A1c 7.8, total cholesterol 201. He was restarted on his cholesterol medication.  Echocardiogram did not show elevated right-sided pressures and ejection fraction was normal. He is concerned about his lack of energy, shortness of breath with exertion and needing to stop frequently to catch his breath. Also worried about his EKG changes. Previous episode of tachycardia in June 2013 that resolved without intervention  EKG today shows normal sinus rhythm with rate 69 beats per minute, no significant ST or T wave changes. Previous T wave abnormality has resolved in the anterolateral leads and inferior leads    Outpatient Encounter Prescriptions as of 06/21/2012  Medication Sig Dispense Refill  . aspirin (ASPIR-81) 81 MG EC tablet Take 81 mg by mouth daily.        . furosemide (LASIX) 20 MG tablet Take 20 mg by mouth daily.      . hydrochlorothiazide (HYDRODIURIL) 50 MG tablet Take 50 mg by mouth daily.        . insulin aspart (NOVOLOG) 100 UNIT/ML injection Inject into the skin 3 (three) times daily before meals. 26-16-28       . insulin glargine (LANTUS) 100 UNIT/ML injection Inject 60 Units into the skin at bedtime.       . IRON CR PO Take 65 mg by mouth at bedtime.      . Levothyroxine Sodium 25 MCG CAPS Take by mouth daily.        Marland Kitchen  lisinopril (PRINIVIL,ZESTRIL) 40 MG tablet Take 40 mg by mouth 2 (two) times daily.        . metoprolol tartrate (LOPRESSOR) 25 MG tablet Take 25 mg by mouth 2 (two) times daily.      Marland Kitchen omeprazole (PRILOSEC) 20 MG capsule Take 20 mg by mouth daily.        Marland Kitchen ezetimibe-simvastatin (VYTORIN) 10-40 MG per tablet Take 1 tablet by mouth at bedtime.  30 tablet  6  . potassium chloride (K-DUR) 10 MEQ tablet Take 1 tablet (10 mEq total) by mouth daily as needed. Take with lasix  30 tablet  6    Review of Systems  Constitutional: Positive for fatigue.  HENT: Negative.   Eyes: Negative.   Respiratory: Positive for shortness of breath.   Gastrointestinal: Negative.   Musculoskeletal: Negative.        Legs are heavy  Skin: Negative.   Neurological: Positive for weakness.  Hematological: Negative.   Psychiatric/Behavioral: Negative.   All other systems reviewed and are negative.    BP 140/60  Pulse 69  Ht 5\' 6"  (1.676 m)  Wt 219 lb 8 oz (99.565 kg)  BMI 35.43 kg/m2  Physical Exam  Nursing note and vitals reviewed. Constitutional: He is oriented to person, place, and time. He appears well-developed and well-nourished.        obese  HENT:  Head: Normocephalic.  Nose: Nose normal.  Mouth/Throat: Oropharynx is clear and moist.  Eyes: Conjunctivae are normal. Pupils are equal, round, and reactive to light.  Neck: Normal range of motion. Neck supple. No JVD present.  Cardiovascular: Normal rate, regular rhythm, S1 normal, S2 normal, normal heart sounds and intact distal pulses.  Exam reveals decreased pulses. Exam reveals no gallop and no friction rub.   No murmur heard. Pulses:      Carotid pulses are 2+ on the right side, and 2+ on the left side.      Radial pulses are 2+ on the right side, and 2+ on the left side.  Pulmonary/Chest: Effort normal and breath sounds normal. No respiratory distress. He has no wheezes. He has no rales. He exhibits no tenderness.  Abdominal: Soft. Bowel sounds  are normal. He exhibits no distension. There is no tenderness.  Musculoskeletal: Normal range of motion. He exhibits no edema and no tenderness.  Lymphadenopathy:    He has no cervical adenopathy.  Neurological: He is alert and oriented to person, place, and time. Coordination normal.  Skin: Skin is warm and dry. No rash noted. No erythema.  Psychiatric: He has a normal mood and affect. His behavior is normal. Judgment and thought content normal.           Assessment and Plan

## 2012-07-27 ENCOUNTER — Telehealth: Payer: Self-pay | Admitting: Cardiovascular Disease

## 2012-07-27 ENCOUNTER — Other Ambulatory Visit: Payer: Self-pay

## 2012-07-27 DIAGNOSIS — R079 Chest pain, unspecified: Secondary | ICD-10-CM

## 2012-07-27 DIAGNOSIS — I25810 Atherosclerosis of coronary artery bypass graft(s) without angina pectoris: Secondary | ICD-10-CM

## 2012-07-27 NOTE — Telephone Encounter (Signed)
Line busy

## 2012-07-27 NOTE — Telephone Encounter (Signed)
Pt has questions about the cath that [facility] wants pt to have done.

## 2012-07-27 NOTE — Telephone Encounter (Signed)
Pt wishes to proceed with heart cath. Pt requests 9/26, 9/27 or 9/30. I explained Dr. Mariah Milling would be available 08/16/12.  Pt is ok with this date.  We will arrange and call him back with details.  Understanding verb.

## 2012-07-27 NOTE — Telephone Encounter (Signed)
Verbal instructions given to pt re: LHC. Understanding verb. Also placed instructions at [facility] for him to pick up.

## 2012-08-13 ENCOUNTER — Other Ambulatory Visit: Payer: Medicare Other

## 2012-08-14 ENCOUNTER — Other Ambulatory Visit (INDEPENDENT_AMBULATORY_CARE_PROVIDER_SITE_OTHER): Payer: Medicare Other

## 2012-08-14 DIAGNOSIS — R079 Chest pain, unspecified: Secondary | ICD-10-CM

## 2012-08-14 DIAGNOSIS — I2510 Atherosclerotic heart disease of native coronary artery without angina pectoris: Secondary | ICD-10-CM

## 2012-08-14 DIAGNOSIS — I25810 Atherosclerosis of coronary artery bypass graft(s) without angina pectoris: Secondary | ICD-10-CM

## 2012-08-15 ENCOUNTER — Telehealth: Payer: Self-pay

## 2012-08-15 LAB — PROTIME-INR
INR: 1.1 (ref 0.8–1.2)
Prothrombin Time: 11.2 s (ref 9.1–12.0)

## 2012-08-15 LAB — BASIC METABOLIC PANEL
BUN: 21 mg/dL (ref 8–27)
Calcium: 9.2 mg/dL (ref 8.6–10.2)
Chloride: 98 mmol/L (ref 97–108)
GFR calc Af Amer: 80 mL/min/{1.73_m2} (ref >59)
GFR calc non Af Amer: 69 mL/min/{1.73_m2} (ref >59)
Glucose: 225 mg/dL — ABNORMAL HIGH (ref 65–99)

## 2012-08-15 NOTE — Telephone Encounter (Signed)
[facility] called back and says they have no record of CABG in 1994 in this pt. She says they do not have pt in [facility] system at all during that time. I called wife back to confirm, again, that pt had this done at [facility] She confirms [facility] was place of surgery I called pt's PCP, Dr. Sullivan Lone at [facility], who says chart is in storage but they will work on getting this for me and will fax ASAP.

## 2012-08-15 NOTE — Telephone Encounter (Signed)
I spoke with MR at [facility]. They say they will fax me cath from 1994

## 2012-08-15 NOTE — Telephone Encounter (Signed)
Wife says pt had CABG in 1994 at [facility] I will try to get records

## 2012-08-15 NOTE — Telephone Encounter (Signed)
LMTCB re: info I need re: CABG in 1994 and 2006 Need to get records for cath tomm.

## 2012-08-16 ENCOUNTER — Ambulatory Visit: Payer: Self-pay | Admitting: Cardiovascular Disease

## 2012-08-16 DIAGNOSIS — I2510 Atherosclerotic heart disease of native coronary artery without angina pectoris: Secondary | ICD-10-CM

## 2012-08-20 ENCOUNTER — Encounter: Payer: Self-pay | Admitting: *Deleted

## 2012-08-23 ENCOUNTER — Encounter: Payer: Self-pay | Admitting: Cardiovascular Disease

## 2012-08-23 ENCOUNTER — Ambulatory Visit (INDEPENDENT_AMBULATORY_CARE_PROVIDER_SITE_OTHER): Payer: Medicare Other | Admitting: Cardiovascular Disease

## 2012-08-23 VITALS — BP 120/62 | HR 57 | Ht 67.0 in | Wt 218.2 lb

## 2012-08-23 DIAGNOSIS — I10 Essential (primary) hypertension: Secondary | ICD-10-CM

## 2012-08-23 DIAGNOSIS — E119 Type 2 diabetes mellitus without complications: Secondary | ICD-10-CM

## 2012-08-23 DIAGNOSIS — I25810 Atherosclerosis of coronary artery bypass graft(s) without angina pectoris: Secondary | ICD-10-CM

## 2012-08-23 DIAGNOSIS — R5381 Other malaise: Secondary | ICD-10-CM

## 2012-08-23 DIAGNOSIS — E785 Hyperlipidemia, unspecified: Secondary | ICD-10-CM

## 2012-08-23 DIAGNOSIS — R5383 Other fatigue: Secondary | ICD-10-CM

## 2012-08-23 NOTE — Assessment & Plan Note (Signed)
We have encouraged continued exercise, careful diet management in an effort to lose weight. 

## 2012-08-23 NOTE — Assessment & Plan Note (Signed)
Blood pressure is well controlled on today's visit. No changes made to the medications. 

## 2012-08-23 NOTE — Assessment & Plan Note (Signed)
We have recommended he stay on his statin. Goal LDL less than 70. 

## 2012-08-23 NOTE — Assessment & Plan Note (Signed)
Fatigue, weakness, shortness of breath likely secondary to obesity, deconditioning. We have recommended weight loss and exercise.

## 2012-08-23 NOTE — Progress Notes (Signed)
Patient ID: Joseph [name], male    DOB: [birth year], 78 y.o.   MRN: [id number]  HPI Comments: Joseph [name] with a hx of CAD, CABG in 2006, PAD with reported stenosis in the past requiring intervention (details unavailable),  followed by Dr.  Wyn Quaker, obesity, hyperlipidemia, arthritis of the knees, who previously presented with increasing fatigue, SOB, leg weakness,  presenting previously with lower extremity edema, now presenting for routine followup.  He is a patient of Dr. Sullivan Lone.  On his last clinic visit he reported having significant fatigue. He was concerned about underlying coronary artery disease. Cardiac catheterization was performed at [facility] 08/16/2012 Catheterization showed severe three-vessel coronary artery disease with patent grafts x4. There was 80% disease of a small to moderate sized distal left circumflex. Ejection fraction 45-50%. No significant aortic valve stenosis or mitral valve regurgitation. The findings were discussed with interventional cardiology and medical management was recommended for the distal left circumflex disease.  Since his discharge, he reports that he feels well. He is trying to watch his diet. He is considering exercise for weight loss and to strengthen his legs.   previous Echocardiogram did not show elevated right-sided pressures and ejection fraction was normal.  Previous episode of tachycardia in June 2013 that resolved without intervention  EKG today shows normal sinus rhythm with rate 57 beats per minute, no significant ST or T wave changes. Left anterior fascicular block, nonspecific intraventricular conduction delay   Outpatient Encounter Prescriptions as of 08/23/2012  Medication Sig Dispense Refill  . aspirin (ASPIR-81) 81 MG EC tablet Take 81 mg by mouth daily.        Marland Kitchen ezetimibe-simvastatin (VYTORIN) 10-40 MG per tablet Take 1 tablet by mouth at bedtime.  30 tablet  6  . hydrochlorothiazide (HYDRODIURIL) 50 MG tablet Take 50 mg by mouth  daily.        . insulin aspart (NOVOLOG) 100 UNIT/ML injection Inject into the skin 3 (three) times daily before meals. 26-16-28       . insulin glargine (LANTUS) 100 UNIT/ML injection Inject 60 Units into the skin at bedtime.       . IRON CR PO Take 65 mg by mouth at bedtime.      . Levothyroxine Sodium 25 MCG CAPS Take by mouth daily.        Marland Kitchen lisinopril (PRINIVIL,ZESTRIL) 40 MG tablet Take 40 mg by mouth 2 (two) times daily.        Marland Kitchen omeprazole (PRILOSEC) 20 MG capsule Take 20 mg by mouth daily.          Review of Systems  Constitutional: Positive for fatigue.  HENT: Negative.   Eyes: Negative.   Respiratory: Positive for shortness of breath.   Cardiovascular: Negative.   Gastrointestinal: Negative.   Musculoskeletal: Negative.        Legs are heavy  Skin: Negative.   Hematological: Negative.   Psychiatric/Behavioral: Negative.   All other systems reviewed and are negative.    BP 120/62  Pulse 57  Ht 5\' 7"  (1.702 m)  Wt 218 lb 4 oz (98.998 kg)  BMI 34.18 kg/m2  Physical Exam  Nursing note and vitals reviewed. Constitutional: He is oriented to person, place, and time. He appears well-developed and well-nourished.        obese  HENT:  Head: Normocephalic.  Nose: Nose normal.  Mouth/Throat: Oropharynx is clear and moist.  Eyes: Conjunctivae normal are normal. Pupils are equal, round, and reactive to light.  Neck: Normal range of motion.  Neck supple. No JVD present.  Cardiovascular: Normal rate, regular rhythm, S1 normal, S2 normal, normal heart sounds and intact distal pulses.  Exam reveals decreased pulses. Exam reveals no gallop and no friction rub.   No murmur heard. Pulses:      Carotid pulses are 2+ on the right side, and 2+ on the left side.      Radial pulses are 2+ on the right side, and 2+ on the left side.  Pulmonary/Chest: Effort normal and breath sounds normal. No respiratory distress. He has no wheezes. He has no rales. He exhibits no tenderness.    Abdominal: Soft. Bowel sounds are normal. He exhibits no distension. There is no tenderness.  Musculoskeletal: Normal range of motion. He exhibits no edema and no tenderness.  Lymphadenopathy:    He has no cervical adenopathy.  Neurological: He is alert and oriented to person, place, and time. Coordination normal.  Skin: Skin is warm and dry. No rash noted. No erythema.  Psychiatric: He has a normal mood and affect. His behavior is normal. Judgment and thought content normal.           Assessment and Plan

## 2012-08-23 NOTE — Assessment & Plan Note (Signed)
Recent cardiac catheterizations showing patent grafts. Medical management recommended for small distal left circumflex disease. This is likely not the cause of his fatigue. We have recommended aggressive exercise program.

## 2012-08-23 NOTE — Patient Instructions (Addendum)
You are doing well. No medication changes were made.  Please call us if you have new issues that need to be addressed before your next appt.  Your physician wants you to follow-up in: 6 months.  You will receive a reminder letter in the mail two months in advance. If you don't receive a letter, please call our office to schedule the follow-up appointment.   

## 2013-02-14 ENCOUNTER — Encounter: Payer: Self-pay | Admitting: Cardiovascular Disease

## 2013-02-14 ENCOUNTER — Ambulatory Visit (INDEPENDENT_AMBULATORY_CARE_PROVIDER_SITE_OTHER): Payer: Medicare Other | Admitting: Cardiovascular Disease

## 2013-02-14 VITALS — BP 139/74 | HR 63 | Ht 66.5 in | Wt 223.0 lb

## 2013-02-14 DIAGNOSIS — E785 Hyperlipidemia, unspecified: Secondary | ICD-10-CM

## 2013-02-14 DIAGNOSIS — I10 Essential (primary) hypertension: Secondary | ICD-10-CM

## 2013-02-14 DIAGNOSIS — I25810 Atherosclerosis of coronary artery bypass graft(s) without angina pectoris: Secondary | ICD-10-CM

## 2013-02-14 DIAGNOSIS — R609 Edema, unspecified: Secondary | ICD-10-CM

## 2013-02-14 DIAGNOSIS — E119 Type 2 diabetes mellitus without complications: Secondary | ICD-10-CM

## 2013-02-14 DIAGNOSIS — R5381 Other malaise: Secondary | ICD-10-CM

## 2013-02-14 DIAGNOSIS — R5383 Other fatigue: Secondary | ICD-10-CM

## 2013-02-14 NOTE — Progress Notes (Signed)
Patient ID: Joseph [name], male    DOB: [birth year], 79 y.o.   MRN: [id number]  HPI Comments: Joseph [name] is a 79 year old gentleman with a hx of CAD, CABG in 2006, PAD with reported stenosis in the past requiring intervention (details unavailable),  followed by Dr.  Wyn Quaker, obesity, hyperlipidemia, arthritis of the knees, who previously presented with increasing fatigue, SOB, leg weakness,  Prior episodes of lower extremity edema, now presenting for routine followup.  He is a patient of Dr. Sullivan Lone.  Last Cardiac catheterization was performed at [facility] 08/16/2012 Catheterization showed severe three-vessel coronary artery disease with patent grafts x4. There was 80% disease of a small to moderate sized distal left circumflex. Ejection fraction 45-50%. No significant aortic valve stenosis or mitral valve regurgitation. The findings were discussed with interventional cardiology and medical management was recommended for the distal left circumflex disease.  He reports having recent wheezing sensation in his legs when he walks. He is uncertain if it is coming from his hips, lower back or legs. No dramatic change in shortness of breath, no significant chest pain. He is not exercising on a regular basis. Weight has been trending upwards. Hemoglobin A1c 7.8 in 2013, total cholesterol 201, LDL 128, HDL 41 He could not afford vytorin and was changed to generic Lipitor 10 mg daily. No recent lipid panel for review   previous Echocardiogram did not show elevated right-sided pressures and ejection fraction was normal.  Previous episode of tachycardia in June 2013 that resolved without intervention  EKG today shows normal sinus rhythm with rate 63 beats per minute, no significant ST or T wave changes. Left anterior fascicular block, nonspecific intraventricular conduction delay   Outpatient Encounter Prescriptions as of 02/14/2013  Medication Sig Dispense Refill  . aspirin (ASPIR-81) 81 MG EC tablet Take 81  mg by mouth daily.        Marland Kitchen atorvastatin (LIPITOR) 10 MG tablet Take 10 mg by mouth daily.       . hydrochlorothiazide (HYDRODIURIL) 50 MG tablet Take 50 mg by mouth daily.        . insulin aspart (NOVOLOG) 100 UNIT/ML injection Inject into the skin 3 (three) times daily before meals. 26-16-28       . insulin glargine (LANTUS) 100 UNIT/ML injection Inject 60 Units into the skin at bedtime.       . Levothyroxine Sodium 25 MCG CAPS Take by mouth daily.        Marland Kitchen lisinopril (PRINIVIL,ZESTRIL) 40 MG tablet Take 40 mg by mouth 2 (two) times daily.        Marland Kitchen omeprazole (PRILOSEC) 20 MG capsule Take 20 mg by mouth daily.          Review of Systems  HENT: Negative.   Eyes: Negative.   Cardiovascular: Negative.   Gastrointestinal: Negative.   Musculoskeletal: Positive for myalgias and gait problem.       Legs are heavy  Skin: Negative.   Neurological: Negative.   Psychiatric/Behavioral: Negative.   All other systems reviewed and are negative.    BP 139/74  Pulse 63  Ht 5' 6.5" (1.689 m)  Wt 223 lb (101.152 kg)  BMI 35.46 kg/m2  Physical Exam  Nursing note and vitals reviewed. Constitutional: He is oriented to person, place, and time. He appears well-developed and well-nourished.   obese  HENT:  Head: Normocephalic.  Nose: Nose normal.  Mouth/Throat: Oropharynx is clear and moist.  Eyes: Conjunctivae are normal. Pupils are equal, round, and reactive to light.  Neck: Normal range of motion. Neck supple. No JVD present.  Cardiovascular: Normal rate, regular rhythm, S1 normal, S2 normal, normal heart sounds and intact distal pulses.  Exam reveals decreased pulses. Exam reveals no gallop and no friction rub.   No murmur heard. Pulses:      Carotid pulses are 2+ on the right side, and 2+ on the left side.      Radial pulses are 2+ on the right side, and 2+ on the left side.  Pulmonary/Chest: Effort normal and breath sounds normal. No respiratory distress. He has no wheezes. He has no  rales. He exhibits no tenderness.  Abdominal: Soft. Bowel sounds are normal. He exhibits no distension. There is no tenderness.  Musculoskeletal: Normal range of motion. He exhibits no edema and no tenderness.  Lymphadenopathy:    He has no cervical adenopathy.  Neurological: He is alert and oriented to person, place, and time. Coordination normal.  Skin: Skin is warm and dry. No rash noted. No erythema.  Psychiatric: He has a normal mood and affect. His behavior is normal. Judgment and thought content normal.      Assessment and Plan

## 2013-02-14 NOTE — Assessment & Plan Note (Signed)
We have encouraged continued exercise, careful diet management in an effort to lose weight. 

## 2013-02-14 NOTE — Assessment & Plan Note (Signed)
Blood pressure is well controlled on today's visit. No changes made to the medications. 

## 2013-02-14 NOTE — Assessment & Plan Note (Signed)
Currently with no symptoms of angina. No further workup at this time. Continue current medication regimen. 

## 2013-02-14 NOTE — Assessment & Plan Note (Signed)
No significant edema on today's visit 

## 2013-02-14 NOTE — Assessment & Plan Note (Signed)
Leg fatigue, mild shortness of breath. Likely from deconditioning. Unable to rule out some lower back disease process. We have suggested he talk with Dr. Sullivan Lone and if symptoms get worse, consider x-ray of his lower back/lumbar spine. Unable to exclude spinal stenosis.

## 2013-02-14 NOTE — Assessment & Plan Note (Signed)
He will come in next week for fasting lipid panel. If not at goal with LDL less than 70, we'll increase his generic Lipitor to 20 mg daily and titrate up slowly

## 2013-02-14 NOTE — Patient Instructions (Addendum)
You are doing well. No medication changes were made.  Please come in next week fasting for a lab draw for cholesterol  Please call us if you have new issues that need to be addressed before your next appt.  Your physician wants you to follow-up in: 6 months.  You will receive a reminder letter in the mail two months in advance. If you don't receive a letter, please call our office to schedule the follow-up appointment.

## 2013-02-15 ENCOUNTER — Ambulatory Visit (INDEPENDENT_AMBULATORY_CARE_PROVIDER_SITE_OTHER): Payer: Medicare Other

## 2013-02-15 DIAGNOSIS — I25810 Atherosclerosis of coronary artery bypass graft(s) without angina pectoris: Secondary | ICD-10-CM

## 2013-02-15 DIAGNOSIS — E785 Hyperlipidemia, unspecified: Secondary | ICD-10-CM

## 2013-02-16 LAB — LIPID PANEL
Chol/HDL Ratio: 3.7 ratio units (ref 0.0–5.0)
Triglycerides: 109 mg/dL (ref 0–149)

## 2013-02-16 LAB — HEPATIC FUNCTION PANEL
ALT: 20 IU/L (ref 0–44)
Bilirubin, Direct: 0.12 mg/dL (ref 0.00–0.40)
Total Bilirubin: 0.4 mg/dL (ref 0.0–1.2)

## 2013-02-19 ENCOUNTER — Other Ambulatory Visit: Payer: Self-pay

## 2013-02-19 MED ORDER — ATORVASTATIN CALCIUM 10 MG PO TABS: ORAL_TABLET | ORAL | Status: DC

## 2013-02-26 ENCOUNTER — Telehealth: Payer: Self-pay

## 2013-02-26 NOTE — Telephone Encounter (Signed)
Pt states he had labs last month

## 2013-02-27 ENCOUNTER — Ambulatory Visit: Payer: Medicare Other | Admitting: Cardiovascular Disease

## 2013-03-04 ENCOUNTER — Ambulatory Visit: Payer: Medicare Other | Admitting: Cardiovascular Disease

## 2013-04-24 ENCOUNTER — Telehealth: Payer: Self-pay

## 2013-04-24 MED ORDER — ATORVASTATIN CALCIUM 10 MG PO TABS: ORAL_TABLET | ORAL | Status: DC

## 2013-04-24 NOTE — Telephone Encounter (Signed)
Pt has question about cholesterol medication -Lipitor-. Please call.

## 2013-04-24 NOTE — Telephone Encounter (Signed)
Pt just needed RX refill for atorvastatin RX sent to pharm

## 2013-04-25 ENCOUNTER — Telehealth: Payer: Self-pay | Admitting: *Deleted

## 2013-04-25 NOTE — Telephone Encounter (Signed)
Prior Authorization Form Faxed for Approval for Atorvastatin Calcium (Lipitor). 04/25/13 8:38 am Faxed to [facility] [phone]

## 2013-04-30 ENCOUNTER — Ambulatory Visit: Payer: Self-pay | Admitting: Family Medicine

## 2013-05-03 ENCOUNTER — Telehealth: Payer: Self-pay | Admitting: *Deleted

## 2013-05-03 NOTE — Telephone Encounter (Signed)
Pt has been approved for Atorvastatin from 04/25/13-04/25/2014.

## 2013-08-13 ENCOUNTER — Encounter: Payer: Self-pay | Admitting: Cardiovascular Disease

## 2013-08-13 ENCOUNTER — Ambulatory Visit (INDEPENDENT_AMBULATORY_CARE_PROVIDER_SITE_OTHER): Payer: Medicare Other | Admitting: Cardiovascular Disease

## 2013-08-13 VITALS — BP 120/72 | HR 47 | Ht 67.0 in | Wt 219.5 lb

## 2013-08-13 DIAGNOSIS — R5383 Other fatigue: Secondary | ICD-10-CM

## 2013-08-13 DIAGNOSIS — I498 Other specified cardiac arrhythmias: Secondary | ICD-10-CM

## 2013-08-13 DIAGNOSIS — I10 Essential (primary) hypertension: Secondary | ICD-10-CM

## 2013-08-13 DIAGNOSIS — R001 Bradycardia, unspecified: Secondary | ICD-10-CM | POA: Insufficient documentation

## 2013-08-13 DIAGNOSIS — R5381 Other malaise: Secondary | ICD-10-CM

## 2013-08-13 DIAGNOSIS — E785 Hyperlipidemia, unspecified: Secondary | ICD-10-CM

## 2013-08-13 DIAGNOSIS — I25810 Atherosclerosis of coronary artery bypass graft(s) without angina pectoris: Secondary | ICD-10-CM

## 2013-08-13 NOTE — Assessment & Plan Note (Signed)
Blood pressure is well controlled on today's visit. No changes made to the medications. 

## 2013-08-13 NOTE — Progress Notes (Signed)
Patient ID: Joseph [name], male    DOB: 03/01/1925, 79 y.o.   MRN: [id number]  HPI Comments: Joseph [name] is a 79 year old gentleman with a hx of CAD, CABG in 2006, PAD with reported stenosis in the past requiring intervention (details unavailable),  followed by Dr.  Wyn Quaker, obesity, hyperlipidemia, arthritis of the knees, who previously presented with increasing fatigue, SOB, leg weakness,  Prior episodes of lower extremity edema, now presenting for routine followup.  He is a patient of Dr. Sullivan Lone.  Last Cardiac catheterization was performed at [facility] 08/16/2012 Catheterization showed severe three-vessel coronary artery disease with patent grafts x4. There was 80% disease of a small to moderate sized distal left circumflex. Ejection fraction 45-50%. No significant aortic valve stenosis or mitral valve regurgitation. The findings were discussed with interventional cardiology and medical management was recommended for the distal left circumflex disease.  Overall he has been doing well. Reports having significant leg weakness when he walks. Denies having any chest pain, shortness of breath.  He is not exercising on a regular basis.  Recent lab work shows total cholesterol 143, LDL 80, HDL 42, hemoglobin A1c 7.2, normal renal function  He could not afford vytorin and was changed to generic Lipitor    previous Echocardiogram did not show elevated right-sided pressures and ejection fraction was normal.  Previous episode of tachycardia in June 2013 that resolved without intervention  EKG today shows sinus bradycardia with rate 47 beats per minute, left anterior fascicular block, poor R-wave progression through the anterior precordial leads    Outpatient Encounter Prescriptions as of 08/13/2013  Medication Sig Dispense Refill  . aspirin (ASPIR-81) 81 MG EC tablet Take 81 mg by mouth daily.        Marland Kitchen atorvastatin (LIPITOR) 10 MG tablet Take 1 tablet daily alternating with 2 tablets every other day   135 tablet  3  . hydrochlorothiazide (HYDRODIURIL) 50 MG tablet Take 50 mg by mouth daily.        . insulin aspart (NOVOLOG) 100 UNIT/ML injection Inject into the skin 3 (three) times daily before meals. 26-16-28       . insulin glargine (LANTUS) 100 UNIT/ML injection Inject 70 Units into the skin at bedtime.       . Levothyroxine Sodium 25 MCG CAPS Take by mouth daily.        Marland Kitchen lisinopril (PRINIVIL,ZESTRIL) 40 MG tablet Take 40 mg by mouth 2 (two) times daily.        Marland Kitchen omeprazole (PRILOSEC) 20 MG capsule Take 20 mg by mouth daily.        . prednisoLONE acetate (PRED FORTE) 1 % ophthalmic suspension Place 1 drop into the left eye daily.       No facility-administered encounter medications on file as of 08/13/2013.     Review of Systems  Constitutional: Negative.   HENT: Negative.   Eyes: Negative.   Respiratory: Negative.   Cardiovascular: Negative.   Gastrointestinal: Negative.   Endocrine: Negative.   Musculoskeletal: Positive for myalgias and gait problem.       Legs are heavy  Skin: Negative.   Allergic/Immunologic: Negative.   Neurological: Negative.   Hematological: Negative.   Psychiatric/Behavioral: Negative.   All other systems reviewed and are negative.    BP 120/72  Pulse 47  Ht 5\' 7"  (1.702 m)  Wt 219 lb 8 oz (99.565 kg)  BMI 34.37 kg/m2  Physical Exam  Nursing note and vitals reviewed. Constitutional: He is oriented to person, place, and  time. He appears well-developed and well-nourished.   obese  HENT:  Head: Normocephalic.  Nose: Nose normal.  Mouth/Throat: Oropharynx is clear and moist.  Eyes: Conjunctivae are normal. Pupils are equal, round, and reactive to light.  Neck: Normal range of motion. Neck supple. No JVD present.  Cardiovascular: Normal rate, regular rhythm, S1 normal, S2 normal, normal heart sounds and intact distal pulses.  Exam reveals no gallop, no friction rub and no decreased pulses.   No murmur heard. Pulses:      Carotid pulses are  2+ on the right side, and 2+ on the left side.      Radial pulses are 2+ on the right side, and 2+ on the left side.  Pulmonary/Chest: Effort normal and breath sounds normal. No respiratory distress. He has no wheezes. He has no rales. He exhibits no tenderness.  Abdominal: Soft. Bowel sounds are normal. He exhibits no distension. There is no tenderness.  Musculoskeletal: Normal range of motion. He exhibits no edema and no tenderness.  Lymphadenopathy:    He has no cervical adenopathy.  Neurological: He is alert and oriented to person, place, and time. Coordination normal.  Skin: Skin is warm and dry. No rash noted. No erythema.  Psychiatric: He has a normal mood and affect. His behavior is normal. Judgment and thought content normal.      Assessment and Plan

## 2013-08-13 NOTE — Assessment & Plan Note (Signed)
Cholesterol is at goal on the current lipid regimen. No changes to the medications were made.  

## 2013-08-13 NOTE — Patient Instructions (Addendum)
You are doing well. No medication changes were made.  Please call us if you have new issues that need to be addressed before your next appt.  Your physician wants you to follow-up in: 6 months.  You will receive a reminder letter in the mail two months in advance. If you don't receive a letter, please call our office to schedule the follow-up appointment.   

## 2013-08-13 NOTE — Assessment & Plan Note (Signed)
Currently with no symptoms of angina. No further workup at this time. Continue current medication regimen. 

## 2013-08-13 NOTE — Assessment & Plan Note (Signed)
Leg weakness, likely secondary to deconditioning. Encouraged him to start walking more.

## 2013-08-13 NOTE — Assessment & Plan Note (Signed)
Asymptomatic bradycardia. Heart rate previously in the 60s. We have asked him to monitor his pulse at home and call he office for symptoms of dizziness, lightheadedness  or near syncope

## 2013-09-20 ENCOUNTER — Ambulatory Visit (INDEPENDENT_AMBULATORY_CARE_PROVIDER_SITE_OTHER): Payer: Medicare Other | Admitting: Podiatry

## 2013-09-20 ENCOUNTER — Encounter: Payer: Self-pay | Admitting: Podiatry

## 2013-09-20 VITALS — BP 120/74 | HR 62 | Resp 18 | Ht 67.0 in | Wt 219.0 lb

## 2013-09-20 DIAGNOSIS — L03039 Cellulitis of unspecified toe: Secondary | ICD-10-CM

## 2013-09-20 DIAGNOSIS — M79609 Pain in unspecified limb: Secondary | ICD-10-CM

## 2013-09-20 DIAGNOSIS — B351 Tinea unguium: Secondary | ICD-10-CM

## 2013-09-20 NOTE — Patient Instructions (Signed)

## 2013-09-22 NOTE — Progress Notes (Signed)
Subjective:     Patient ID: Joseph [name], male   DOB: [birth year], 79 y.o.   MRN: [id number]  HPI patient is found to have pain in the left hallux medial border with distal redness and irritation of the localized nature and nail disease 1-5 both feet. States been present for several weeks and has not responded trimming or soaking   Review of Systems  All other systems reviewed and are negative.       Objective:   Physical Exam  Nursing note and vitals reviewed. Constitutional: He is oriented to person, place, and time.  Musculoskeletal: Normal range of motion.  Neurological: He is oriented to person, place, and time.  Skin: Skin is warm.   neurovascular status was found to be intact. Patient's left hallux medial border is incurvated and tender when pressed and hard for the patient to wear shoe gear. Remaining nails are thickened dystrophic and painful     Assessment:     Paronychia infection distal left hallux. Nail disease with pain 1-5 both feet    Plan:     Explained paronychia and infiltrated 60 mg Xylocaine Marcaine mixture after sterile prep removed the medial border proud flesh and a small amount of localized abscess tissue and applied sterile dressing. Explained soaks to patient and caregiver and debrided remaining nailbeds 1-5 both feet with no iatrogenic bleeding noted

## 2013-12-20 ENCOUNTER — Ambulatory Visit: Payer: Medicare Other | Admitting: Podiatry

## 2013-12-31 ENCOUNTER — Ambulatory Visit (INDEPENDENT_AMBULATORY_CARE_PROVIDER_SITE_OTHER): Payer: Medicare HMO | Admitting: Podiatry

## 2013-12-31 ENCOUNTER — Encounter: Payer: Self-pay | Admitting: Podiatry

## 2013-12-31 VITALS — BP 146/103 | HR 79 | Resp 16 | Ht 66.0 in | Wt 200.0 lb

## 2013-12-31 DIAGNOSIS — M79609 Pain in unspecified limb: Secondary | ICD-10-CM

## 2013-12-31 DIAGNOSIS — B351 Tinea unguium: Secondary | ICD-10-CM

## 2013-12-31 NOTE — Progress Notes (Signed)
Subjective:     Patient ID: Joseph [name], male   DOB: [birth year], 80 y.o.   MRN: [id number]  HPI patient presents with thick painful toenails 1-5 both feet and he cannot cut himself  Review of Systems     Objective:   Physical Exam Neurovascular status intact with thick painful nailbeds 1-5 both feet    Assessment:     Mycotic nail infection with pain 1-5 both feet    Plan:     Debridement painful nailbeds 1-5 both feet with no bleeding noted

## 2014-02-14 ENCOUNTER — Other Ambulatory Visit: Payer: Self-pay

## 2014-02-27 ENCOUNTER — Other Ambulatory Visit: Payer: Self-pay

## 2014-02-27 MED ORDER — ATORVASTATIN CALCIUM 10 MG PO TABS: ORAL_TABLET | ORAL | Status: DC

## 2014-02-27 NOTE — Telephone Encounter (Signed)
Refill sent for atorvastatin. 

## 2014-03-05 ENCOUNTER — Telehealth: Payer: Self-pay | Admitting: *Deleted

## 2014-03-05 NOTE — Telephone Encounter (Signed)
Patient requiring prior authorization for atorvastatin 10 mg. Pt is overdue for 6 month f/u and is due to schedule a future appointment. Pt insurance information/[employer] ID is not available to start prior authorization. LMTCB

## 2014-03-07 NOTE — Telephone Encounter (Signed)
lmtcb

## 2014-03-10 NOTE — Telephone Encounter (Signed)
Pt is aware that we do not have current insurance info. Available to proceed with Prior authorization for atorvastatin. Pt is aware that he is also due for a f/u appointment. Pt mentioned that he is now with [facility] and is required to go through his PCP for referral to Dr. Rockey Situ. He will contact pcp to schedule appointment and he will bring his insurance information to proceed with prior authorization 03/12/2014.

## 2014-03-21 NOTE — Telephone Encounter (Signed)
Lmtcb; Pt never came in to give insurance information to continue prior authorization. Pt is aware that information is needed for prior authorization due to member id#. Pt has not schedule a future appointment and is aware that he is due for F/u.

## 2014-04-01 ENCOUNTER — Ambulatory Visit (INDEPENDENT_AMBULATORY_CARE_PROVIDER_SITE_OTHER): Payer: Medicare HMO | Admitting: Podiatry

## 2014-04-01 VITALS — BP 148/68 | HR 76 | Resp 16

## 2014-04-01 DIAGNOSIS — B351 Tinea unguium: Secondary | ICD-10-CM

## 2014-04-01 DIAGNOSIS — M79609 Pain in unspecified limb: Secondary | ICD-10-CM

## 2014-04-01 DIAGNOSIS — L03039 Cellulitis of unspecified toe: Secondary | ICD-10-CM

## 2014-04-01 NOTE — Progress Notes (Signed)
Subjective:     Patient ID: Joseph [name], male   DOB: 03/23/1925, 80 y.o.   MRN: [id number]  HPI patient presents with painful medial border of the left big toe that is making it hard for him to sleep and also nail disease 1-5 both feet that are thick and he cannot cut. He is seeing his [place] doctor for possible circulation issues it is due to go back   Review of Systems Neurovascular status is diminished on both feet and I again told him the importance of keeping his appointment at the [place]. The left hallux medial border is incurvated in the corner with redness and localized inflammation of the tissue and all nailbeds are thick and painful with brittle-like disease    Objective:   Physical Exam     Assessment:     Paronychia infection left hallux nail medial border and mycotic painful nailbeds of all feet and also probable circulatory disease of the feet    Plan:     Infiltrated the left hallux 60 mg I can Marcaine mixture and remove the medial border carefully and clean the tissue and applied sterile dressing. Debridement all remaining nailbeds with no iatrogenic bleeding noted and reappoint her recheck

## 2014-04-15 ENCOUNTER — Encounter: Payer: Self-pay | Admitting: Cardiovascular Disease

## 2014-04-15 ENCOUNTER — Ambulatory Visit (INDEPENDENT_AMBULATORY_CARE_PROVIDER_SITE_OTHER): Payer: Medicare PPO | Admitting: Cardiovascular Disease

## 2014-04-15 VITALS — BP 140/62 | HR 64 | Ht 67.0 in | Wt 225.8 lb

## 2014-04-15 DIAGNOSIS — E785 Hyperlipidemia, unspecified: Secondary | ICD-10-CM

## 2014-04-15 DIAGNOSIS — R609 Edema, unspecified: Secondary | ICD-10-CM

## 2014-04-15 DIAGNOSIS — R5383 Other fatigue: Secondary | ICD-10-CM

## 2014-04-15 DIAGNOSIS — I25810 Atherosclerosis of coronary artery bypass graft(s) without angina pectoris: Secondary | ICD-10-CM

## 2014-04-15 DIAGNOSIS — R5381 Other malaise: Secondary | ICD-10-CM

## 2014-04-15 DIAGNOSIS — R600 Localized edema: Secondary | ICD-10-CM | POA: Insufficient documentation

## 2014-04-15 DIAGNOSIS — I10 Essential (primary) hypertension: Secondary | ICD-10-CM

## 2014-04-15 MED ORDER — DOXAZOSIN MESYLATE 4 MG PO TABS: 4.0000 mg | ORAL_TABLET | Freq: Two times a day (BID) | ORAL | Status: DC

## 2014-04-15 NOTE — Assessment & Plan Note (Signed)
Etiology of his fatigue is unclear. Recommended irregular walking program. Suspect he is very deconditioned

## 2014-04-15 NOTE — Assessment & Plan Note (Signed)
Hold his Lipitor for 2 weeks given lower extremity cramping. If no improvement in his symptoms, would restart the Lipitor

## 2014-04-15 NOTE — Assessment & Plan Note (Addendum)
Worsening edema in the past 3 weeks. Suspect this is secondary to amlodipine. Recommended he stop the amlodipine, leg elevation, use Ace wraps or compression hose.

## 2014-04-15 NOTE — Assessment & Plan Note (Signed)
Currently with no symptoms of angina. No further workup at this time. Continue current medication regimen. 

## 2014-04-15 NOTE — Progress Notes (Signed)
Patient ID: Joseph [name], male    DOB: [birth year], 80 y.o.   MRN: [id number]  HPI Comments: Joseph [name] is a 80 year old gentleman with a hx of CAD, CABG in 2006, PAD with reported stenosis in the past requiring intervention (details unavailable),  followed by Dr.  Lucky [name], obesity, hyperlipidemia, arthritis of the knees, who previously presented with increasing fatigue, SOB, leg weakness,  Prior episodes of lower extremity edema, now presenting for routine followup.  He is a patient of [name].  In followup today, he reports that for the past 3 weeks or so his legs have been swelling, skin is very tight, painful. It feels like a sunburn from his knees down. He is noted pitting edema. He has not been drinking excessively, in fact there has been no change to his fluid intake. He has had medication changes, and now on amlodipine 5 mg daily for blood pressure. This was started by the [name].  Reports having significant leg weakness when he walks. Denies having any chest pain, shortness of breath.  He is not exercising on a regular basis.   He does provide recent lab work and testing results. April 2015 total cholesterol 153, LDL 82, hemoglobin A1c improved now down to 7.0, creatinine 1.1 with BUN 15. Ultrasound of the legs showing patent right SFA stent, left side CFA and popliteal artery with good flow, waveform deterioration around the ankle on the left  Last Cardiac catheterization was performed at [name] 08/16/2012 Catheterization showed severe three-vessel coronary artery disease with patent grafts x4. There was 80% disease of a small to moderate sized distal left circumflex. Ejection fraction 45-50%. No significant aortic valve stenosis or mitral valve regurgitation. The findings were discussed with interventional cardiology and medical management was recommended for the distal left circumflex disease.   previous Echocardiogram did not show elevated right-sided pressures and ejection  fraction was normal.  Previous episode of tachycardia in June 2013 that resolved without intervention  EKG today shows normal sinus rhythm with rate 64 beats per minute, left anterior fascicular block, poor R-wave progression through the anterior precordial leads, PVC    Outpatient Encounter Prescriptions as of 04/15/2014  Medication Sig  . amLODipine (NORVASC) 5 MG tablet Take 5 mg by mouth daily.  Marland Kitchen aspirin (ASPIR-81) 81 MG EC tablet Take 81 mg by mouth daily.    Marland Kitchen atorvastatin (LIPITOR) 10 MG tablet Take 1 tablet daily alternating with 2 tablets every other day  . cholecalciferol (VITAMIN D) 1000 UNITS tablet Take 2,000 Units by mouth daily.  . hydrochlorothiazide (HYDRODIURIL) 50 MG tablet Take 50 mg by mouth daily.    . insulin aspart (NOVOLOG) 100 UNIT/ML injection Inject into the skin 3 (three) times daily before meals. 26-16-28  . insulin glargine (LANTUS) 100 UNIT/ML injection Inject 70 Units into the skin at bedtime.   . Levothyroxine Sodium 25 MCG CAPS Take by mouth daily.    Marland Kitchen lisinopril (PRINIVIL,ZESTRIL) 40 MG tablet Take 40 mg by mouth 2 (two) times daily.    Marland Kitchen omeprazole (PRILOSEC) 20 MG capsule Take 20 mg by mouth daily.    . prednisoLONE acetate (PRED FORTE) 1 % ophthalmic suspension Place 1 drop into the left eye daily.     Review of Systems  Constitutional: Negative.   HENT: Negative.   Eyes: Negative.   Respiratory: Negative.   Cardiovascular: Positive for leg swelling.  Gastrointestinal: Negative.   Endocrine: Negative.   Musculoskeletal: Positive for gait problem and myalgias.  Legs are heavy  Skin: Negative.   Allergic/Immunologic: Negative.   Neurological: Negative.   Hematological: Negative.   Psychiatric/Behavioral: Negative.   All other systems reviewed and are negative.   BP 140/62  Ht 5\' 7"  (1.702 m)  Wt 225 lb 12 oz (102.4 kg)  BMI 35.35 kg/m2  Physical Exam  Nursing note and vitals reviewed. Constitutional: He is oriented to person,  place, and time. He appears well-developed and well-nourished.   obese  HENT:  Head: Normocephalic.  Nose: Nose normal.  Mouth/Throat: Oropharynx is clear and moist.  Eyes: Conjunctivae are normal. Pupils are equal, round, and reactive to light.  Neck: Normal range of motion. Neck supple. No JVD present.  Cardiovascular: Normal rate, regular rhythm, S1 normal, S2 normal, normal heart sounds and intact distal pulses.  Exam reveals no gallop, no friction rub and no decreased pulses.   No murmur heard. Pulses:      Carotid pulses are 2+ on the right side, and 2+ on the left side.      Radial pulses are 2+ on the right side, and 2+ on the left side.  1+ pitting edema to below the knees bilaterally  Pulmonary/Chest: Effort normal and breath sounds normal. No respiratory distress. He has no wheezes. He has no rales. He exhibits no tenderness.  Abdominal: Soft. Bowel sounds are normal. He exhibits no distension. There is no tenderness.  Musculoskeletal: Normal range of motion. He exhibits edema. He exhibits no tenderness.  Lymphadenopathy:    He has no cervical adenopathy.  Neurological: He is alert and oriented to person, place, and time. Coordination normal.  Skin: Skin is warm and dry. No rash noted. No erythema.  Psychiatric: He has a normal mood and affect. His behavior is normal. Judgment and thought content normal.      Assessment and Plan

## 2014-04-15 NOTE — Assessment & Plan Note (Signed)
Recommended he stop the amlodipine. We'll start doxazosin 4 mg in the evening. Monitor her his blood pressure at home. Call he office if blood pressure continues to run high. We would change the doxazosin to 4 mg twice a day

## 2014-04-15 NOTE — Patient Instructions (Addendum)
Please stop the amlodipine Leg elevation, ace wrap on the legs to the knees  Please start cardura one at night for blood pressure Monitor your blood pressure If it runs high, call the office We might increase the cardura/doxazosin to two a day  Please call us if you have new issues that need to be addressed before your next appt.  Your physician wants you to follow-up in: 2 months.

## 2014-04-28 ENCOUNTER — Telehealth: Payer: Self-pay | Admitting: *Deleted

## 2014-04-28 NOTE — Telephone Encounter (Signed)
If he is having edema, be sure to have him weigh himself daily and to have a low threshold to call us back if either wt is going up of Ss worsening.  Based on his description of Ss to you, it sounds like he should be seen this week, or at least must sooner than the 23rd.  You could add him to my schedule one day this week (overbook ok) or have Izora Gala open up one slot for me on Thursday afternoon.

## 2014-04-28 NOTE — Telephone Encounter (Signed)
LVM 6/8

## 2014-04-28 NOTE — Telephone Encounter (Signed)
Patient called and his having sob and feeling tired/fatiqued No pain just extremely tired Also has swelling in feet, ankles and legs. He stated the happened before his bypass 20 years ago and is worried that he has more blockages  He requested to see Dr. Rockey Situ earlier than scheduled  Appt changed as requested  Patient stated that he did not feel that his situation was urgent in any way Instructed patient to notify EMS if he felt his situation becomes emergent

## 2014-04-28 NOTE — Telephone Encounter (Signed)
Patient called and his having sob and feeling tired/fatiqued.  No pain just extremely tired. Had bypass 20 years ago and feels the same. Also has swelling in feet, ankles and legs.

## 2014-04-29 ENCOUNTER — Other Ambulatory Visit (INDEPENDENT_AMBULATORY_CARE_PROVIDER_SITE_OTHER): Payer: Medicare HMO

## 2014-04-29 ENCOUNTER — Encounter: Payer: Self-pay | Admitting: Nurse Practitioner

## 2014-04-29 ENCOUNTER — Ambulatory Visit (INDEPENDENT_AMBULATORY_CARE_PROVIDER_SITE_OTHER): Payer: Commercial Managed Care - HMO | Admitting: Nurse Practitioner

## 2014-04-29 ENCOUNTER — Other Ambulatory Visit: Payer: Self-pay

## 2014-04-29 VITALS — BP 130/70 | HR 72 | Ht 67.0 in | Wt 230.2 lb

## 2014-04-29 DIAGNOSIS — R0602 Shortness of breath: Secondary | ICD-10-CM

## 2014-04-29 DIAGNOSIS — I509 Heart failure, unspecified: Secondary | ICD-10-CM

## 2014-04-29 DIAGNOSIS — E785 Hyperlipidemia, unspecified: Secondary | ICD-10-CM

## 2014-04-29 DIAGNOSIS — I10 Essential (primary) hypertension: Secondary | ICD-10-CM

## 2014-04-29 DIAGNOSIS — I2510 Atherosclerotic heart disease of native coronary artery without angina pectoris: Secondary | ICD-10-CM

## 2014-04-29 DIAGNOSIS — I5033 Acute on chronic diastolic (congestive) heart failure: Secondary | ICD-10-CM

## 2014-04-29 MED ORDER — NITROGLYCERIN 0.4 MG SL SUBL
0.4000 mg | SUBLINGUAL_TABLET | SUBLINGUAL | Status: DC | PRN
Start: 2014-04-29 — End: 2019-12-13

## 2014-04-29 MED ORDER — FUROSEMIDE 40 MG PO TABS: 40.0000 mg | ORAL_TABLET | Freq: Every day | ORAL | Status: DC

## 2014-04-29 NOTE — Patient Instructions (Signed)
Your physician has recommended you make the following change in your medication:  Stop Hydrochlorothiazide Start Lasix 80 mg (two 40 mg tablets) twice daily for 3 days  Continue Lasix 40 mg (one tablet) daily after three days   Nitro sublingual 0.4 mg one tablet every 5 minutes x 3 for chest pain   Your physician recommends that you have labs today: CBC  BMP INR  Your physician has requested that you have an echocardiogram before your follow up appt on 5/16. Echocardiography is a painless test that uses sound waves to create images of your heart. It provides your doctor with information about the size and shape of your heart and how well your heart's chambers and valves are working. This procedure takes approximately one hour. There are no restrictions for this procedure.   Your physician recommends that you schedule a follow-up appointment in:  Next Tuesday 5/16 with Ignacia Bayley NP

## 2014-04-29 NOTE — Telephone Encounter (Signed)
Patient scheduled at 1:15 today with C. Sharolyn Douglas

## 2014-04-29 NOTE — Progress Notes (Signed)
Patient Name: Joseph [name] Date of Encounter: 04/29/2014  Primary Care Provider:  Eulas Post, MD Primary Cardiologist:  Johnny Bridge, MD    Patient Profile  80 y/o male with a h/o CAD and diastolic dysfxn, who presents to clinic today as an add-on 2/2 progressive dyspnea and lower ext edema.  Problem List   Past Medical History  Diagnosis Date  . CAD (coronary artery disease)     a. s/p CABG;  b. 07/2012 Cath: 3VD including 80 dLCX (med Rx), 4/4 patent grafts.  . Diabetes mellitus, type 2   . Hyperlipidemia   . HTN (hypertension)   . Hypothyroidism   . Chronic diastolic CHF (congestive heart failure)     a. 07/2012 Echo: EF 55-60%, no rwma, Gr 1 DD, mild MR.  . Lower extremity edema     a. 03/2014 amlodipine d/c'd.   Past Surgical History  Procedure Laterality Date  . Prostate biopsy    . Hemorrhoid surgery    . Appendectomy    . Toe surgery      ingrown toe nail  . Coronary artery bypass graft  1994  . Cardiac catheterization  07/2012    [facility]/no stents  . Corneal transplant      Allergies  No Known Allergies  HPI  80 y/o male with the above problem list.  He was last seen in clinic on 5/26 @ which time he c/o fatigue, lower ext edema, and extremity cramping.  His amlodipine was discontinued as it was thought that it may be contributing to lower ext edema.  Lipitor was held (cramping) and doxasozin was started for BP control in the absence of amlodipine.  Unfortunately, he has had progressive DOE, increasing abd girth, and lower ext edema.  He has had not had c/p, pnd, n, v, dizziness, syncope, or orthopnea.  He called into the office yesterday hoping to move his appt with Dr. Rockey Situ up, and I recommended that he come in today.  His wt is up 5 lbs since his last admission.  Home Medications  Prior to Admission medications   Medication Sig Start Date End Date Taking? Authorizing Provider  aspirin (ASPIR-81) 81 MG EC tablet Take 81 mg by mouth daily.     Yes  Historical Provider, MD  cholecalciferol (VITAMIN D) 1000 UNITS tablet Take 2,000 Units by mouth daily.   Yes Historical Provider, MD  doxazosin (CARDURA) 4 MG tablet Take 1 tablet (4 mg total) by mouth 2 (two) times daily. 04/15/14  Yes Minna Merritts, MD  insulin aspart (NOVOLOG) 100 UNIT/ML injection Inject into the skin 3 (three) times daily before meals. 26-16-28   Yes Historical Provider, MD  insulin glargine (LANTUS) 100 UNIT/ML injection Inject 70 Units into the skin at bedtime.    Yes Historical Provider, MD  Levothyroxine Sodium 25 MCG CAPS Take by mouth daily.     Yes Historical Provider, MD  lisinopril (PRINIVIL,ZESTRIL) 40 MG tablet Take 40 mg by mouth 2 (two) times daily.     Yes Historical Provider, MD  omeprazole (PRILOSEC) 20 MG capsule Take 20 mg by mouth daily.     Yes Historical Provider, MD  prednisoLONE acetate (PRED FORTE) 1 % ophthalmic suspension Place 1 drop into the left eye daily.   Yes Historical Provider, MD  atorvastatin (LIPITOR) 10 MG tablet Take 1 tablet daily alternating with 2 tablets every other day 02/27/14   Minna Merritts, MD  furosemide (LASIX) 40 MG tablet Take 1 tablet (40 mg  total) by mouth daily. 04/29/14   Rogelia Mire, NP  nitroGLYCERIN (NITROSTAT) 0.4 MG SL tablet Place 1 tablet (0.4 mg total) under the tongue every 5 (five) minutes as needed for chest pain. 04/29/14   Rogelia Mire, NP    Review of Systems  As above, progressive DOE, LEE, increasing abd girth.  In that setting, he continues to have fatigue.  No chest pain.  All other systems reviewed and are otherwise negative except as noted above.  Physical Exam  Blood pressure 130/70, pulse 72, height 5\' 7"  (1.702 m), weight 230 lb 4 oz (104.441 kg).  General: Pleasant, NAD Psych: Normal affect. Neuro: Alert and oriented X 3. Moves all extremities spontaneously. HEENT: Normal  Neck: Supple without bruits.  JVP ~ 12. Lungs:  Resp regular and unlabored, CTA. Heart: RRR, soft syst  murmur LUSB.   Abdomen: Soft, non-tender, non-distended, BS + x 4.  Extremities: No clubbing, cyanosis.  1-2+ bilat LE edema to knees.  DP/PT/Radials 2+ and equal bilaterally.  Accessory Clinical Findings  ECG - RSR, pac's, ivcd, lad, lafb, lat st dep - no acute st/t changes.  Assessment & Plan  1.  Acute on chronic diastolic CHF:  Pt presents with a several wk h/o progressive DOE and LEE.  His wt is up 5 lbs from his last clinic visit on 5/26.  He does have evidence of volume overload on exam.  Last echo in 2013 showed nl EF with Gr 1 diastolic dysfxn.  HR/BP stable.  Admits to adding salt to food on a regular basis along with eating out often.  I recommended avoiding fast food and salting food.  Will change him from hctz to lasix 40mg  2 tabs bid x 3 days then 1 tab bid.  Advised that he weigh himself daily and report further wt gain or progression of Ss.  Will check cbc, bmet, Mg today and plan to repeat echo.  Will see him back in clinic in 1 wk to reassess Ss and volume status.  Hopefully echo will be done by then.  He feels that Ss are similar to prior anginal equivalent, with the exception of volume overload.  He may require cath if Ss do not improve with diuresis or if EF now down on echo.  2.  CAD:  See discussion above.  No c/p but progressive dyspnea in the setting of volume overload.  Will check echo to re-eval EF (prev nl).  Cont asa.  I've given him a Rx for sl ntg, as he does not have this @ home.  He may require cath if Ss do not improve with diuresis or if EF now down on echo.  3.  HTN:  Stable.  4.  HL:  Lipitor on hold.  With other Ss, difficult to tell @ this point to what extent statin may have been playing a role in fatigue and cramping.  5.  DM:  Insulin per primary care.  6.  Dispo:  Labs today.  Echo pending. F/U next Tuesday with me or sooner if necessary.    Rogelia Mire, NP 04/29/2014, 3:04 PM

## 2014-04-30 LAB — BASIC METABOLIC PANEL
BUN / CREAT RATIO: 18 (ref 10–22)
BUN: 18 mg/dL (ref 8–27)
CALCIUM: 9 mg/dL (ref 8.6–10.2)
CO2: 24 mmol/L (ref 18–29)
CREATININE: 1 mg/dL (ref 0.76–1.27)
Chloride: 105 mmol/L (ref 97–108)
GFR calc Af Amer: 78 mL/min/{1.73_m2} (ref >59)
GFR, EST NON AFRICAN AMERICAN: 68 mL/min/{1.73_m2} (ref >59)
Glucose: 132 mg/dL — ABNORMAL HIGH (ref 65–99)
Potassium: 4.2 mmol/L (ref 3.5–5.2)
SODIUM: 142 mmol/L (ref 134–144)

## 2014-04-30 LAB — CBC WITH DIFFERENTIAL
BASOS: 2 %
Basophils Absolute: 0.1 10*3/uL (ref 0.0–0.2)
EOS: 11 %
Eosinophils Absolute: 0.6 10*3/uL — ABNORMAL HIGH (ref 0.0–0.4)
HEMATOCRIT: 37.8 % (ref 37.5–51.0)
HEMOGLOBIN: 12.7 g/dL (ref 12.6–17.7)
Immature Grans (Abs): 0 10*3/uL (ref 0.0–0.1)
Immature Granulocytes: 0 %
LYMPHS ABS: 1.8 10*3/uL (ref 0.7–3.1)
Lymphs: 32 %
MCH: 30.8 pg (ref 26.6–33.0)
MCHC: 33.6 g/dL (ref 31.5–35.7)
MCV: 92 fL (ref 79–97)
MONOCYTES: 9 %
MONOS ABS: 0.5 10*3/uL (ref 0.1–0.9)
Neutrophils Absolute: 2.6 10*3/uL (ref 1.4–7.0)
Neutrophils Relative %: 46 %
PLATELETS: 184 10*3/uL (ref 150–379)
RBC: 4.12 x10E6/uL — ABNORMAL LOW (ref 4.14–5.80)
RDW: 13.6 % (ref 12.3–15.4)
WBC: 5.7 10*3/uL (ref 3.4–10.8)

## 2014-04-30 LAB — PROTIME-INR
INR: 1 (ref 0.8–1.2)
Prothrombin Time: 10.4 s (ref 9.1–12.0)

## 2014-05-02 ENCOUNTER — Telehealth: Payer: Self-pay | Admitting: *Deleted

## 2014-05-02 NOTE — Telephone Encounter (Signed)
I spoke with Joseph [name] about echo & lab results. He states that he still feels tired & fatigued but not as much.  States he has lost 12 pounds in the past week ( weight 218 today) He is staying away from salt & is trying to eat more heart healthy  He is aware of upcoming appts with Gerald Stabs & Dr. Rockey Situ   He was concerned that his insurance might not cover his visits & he may need a referral for future visits. Will forward a message to [place].  Horton Chin RN

## 2014-05-06 ENCOUNTER — Encounter: Payer: Self-pay | Admitting: Nurse Practitioner

## 2014-05-06 ENCOUNTER — Ambulatory Visit (INDEPENDENT_AMBULATORY_CARE_PROVIDER_SITE_OTHER): Payer: Commercial Managed Care - HMO | Admitting: Nurse Practitioner

## 2014-05-06 VITALS — BP 158/70 | HR 67 | Ht 66.5 in | Wt 222.8 lb

## 2014-05-06 DIAGNOSIS — I10 Essential (primary) hypertension: Secondary | ICD-10-CM

## 2014-05-06 DIAGNOSIS — I2510 Atherosclerotic heart disease of native coronary artery without angina pectoris: Secondary | ICD-10-CM | POA: Diagnosis not present

## 2014-05-06 DIAGNOSIS — I208 Other forms of angina pectoris: Secondary | ICD-10-CM

## 2014-05-06 DIAGNOSIS — E785 Hyperlipidemia, unspecified: Secondary | ICD-10-CM

## 2014-05-06 DIAGNOSIS — I209 Angina pectoris, unspecified: Secondary | ICD-10-CM | POA: Diagnosis not present

## 2014-05-06 DIAGNOSIS — I509 Heart failure, unspecified: Secondary | ICD-10-CM

## 2014-05-06 DIAGNOSIS — R0602 Shortness of breath: Secondary | ICD-10-CM

## 2014-05-06 DIAGNOSIS — I5033 Acute on chronic diastolic (congestive) heart failure: Secondary | ICD-10-CM

## 2014-05-06 MED ORDER — ISOSORBIDE MONONITRATE ER 30 MG PO TB24: 30.0000 mg | ORAL_TABLET | Freq: Every day | ORAL | Status: DC

## 2014-05-06 NOTE — Progress Notes (Signed)
Patient Name: Joseph [name] Date of Encounter: 05/06/2014  Primary Care Provider:  Eulas Post, MD Primary Cardiologist:  Johnny Bridge, MD   Patient Profile  80 y/o male with a h/o CAD and diast CHF who presents for f/u related to progressive dyspnea and lower ext edema.  Problem List   Past Medical History  Diagnosis Date  . CAD (coronary artery disease)     a. s/p CABG;  b. 07/2012 Cath: 3VD including 80 dLCX (med Rx), 4/4 patent grafts.  . Diabetes mellitus, type 2   . Hyperlipidemia   . HTN (hypertension)   . Hypothyroidism   . Chronic diastolic CHF (congestive heart failure)     a. 07/2012 Echo: EF 55-60%, no rwma, Gr 1 DD, mild MR;  04/2014 Echo: EF 55-60%, no rwma, mild MR, mildly dil LA.  Marland Kitchen Lower extremity edema     a. 03/2014 amlodipine d/c'd.   Past Surgical History  Procedure Laterality Date  . Prostate biopsy    . Hemorrhoid surgery    . Appendectomy    . Toe surgery      ingrown toe nail  . Coronary artery bypass graft  1994  . Cardiac catheterization  07/2012    [facility]/no stents  . Corneal transplant      Allergies  No Known Allergies  HPI  80 y/o male with the above problem list.  He was last seen in clinic on 6/9 @ which time he was c/o progressive DOE and lower ext edema over the course of several wks.  I switched him from HCTZ to lasix 40 bid x 3 days then 40mg  daily.  Since then, he is down 12 lbs and has significantly less LEE.  Echo showed nl LV fxn.  Despite these findings, he continues to have DOE with relatively minimal exertion.  He has not had chest pain.  He denies palpitations, pnd, orthopnea, n, v, dizziness, syncope, or early satiety.   Home Medications  Prior to Admission medications   Medication Sig Start Date End Date Taking? Authorizing Provider  aspirin (ASPIR-81) 81 MG EC tablet Take 81 mg by mouth daily.     Yes Historical Provider, MD  atorvastatin (LIPITOR) 10 MG tablet Take 10 mg by mouth daily.   Yes Historical Provider,  MD  cholecalciferol (VITAMIN D) 1000 UNITS tablet Take 2,000 Units by mouth daily.   Yes Historical Provider, MD  doxazosin (CARDURA) 4 MG tablet Take 1 tablet (4 mg total) by mouth 2 (two) times daily. 04/15/14  Yes Minna Merritts, MD  furosemide (LASIX) 40 MG tablet Take 1 tablet (40 mg total) by mouth daily. 04/29/14  Yes Rogelia Mire, NP  insulin aspart (NOVOLOG) 100 UNIT/ML injection Inject into the skin 3 (three) times daily before meals. 26-16-28   Yes Historical Provider, MD  insulin glargine (LANTUS) 100 UNIT/ML injection Inject 70 Units into the skin at bedtime.    Yes Historical Provider, MD  Levothyroxine Sodium 25 MCG CAPS Take by mouth daily.     Yes Historical Provider, MD  lisinopril (PRINIVIL,ZESTRIL) 40 MG tablet Take 40 mg by mouth daily.   Yes Historical Provider, MD  nitroGLYCERIN (NITROSTAT) 0.4 MG SL tablet Place 1 tablet (0.4 mg total) under the tongue every 5 (five) minutes as needed for chest pain. 04/29/14  Yes Rogelia Mire, NP  omeprazole (PRILOSEC) 20 MG capsule Take 20 mg by mouth daily.     Yes Historical Provider, MD  prednisoLONE acetate (PRED FORTE) 1 %  ophthalmic suspension Place 1 drop into the left eye daily.   Yes Historical Provider, MD  isosorbide mononitrate (IMDUR) 30 MG 24 hr tablet Take 1 tablet (30 mg total) by mouth daily. 05/06/14   Rogelia Mire, NP    Review of Systems  As above, he continues to experience DOE w/o chest pain. All other systems reviewed and are otherwise negative except as noted above.  Physical Exam  Blood pressure 158/70, pulse 67, height 5' 6.5" (1.689 m), weight 222 lb 12 oz (101.039 kg).  General: Pleasant, NAD Psych: Normal affect. Neuro: Alert and oriented X 3. Moves all extremities spontaneously. HEENT: Normal  Neck: Supple without bruits or JVD. Lungs:  Resp regular and unlabored, CTA. Heart: RRR no s3, s4.  2/6 sem @ usb bilat. Abdomen: Soft, non-tender, non-distended, BS + x 4.  Extremities: No  clubbing, cyanosis.  1+ bilat LEE. PT 1+ and equal bilaterally.  Accessory Clinical Findings  ECG - rsr, 67, ivcd, lad, poor r prog -no acute changes.  Assessment & Plan  1.  Acute on chronic diastolic CHF:  Since being placed on lasix, he is down 12 lbs with less edema and some improvement in abd girth.  HR stable.  BP up today.  Despite relative improvement in volume status and nl EF by echo, he continues to have DOE with mild LEE.  Cont current meds and will add imdur as I suspect his DOE is his anginal equivalent, however he would like to avoid cath at this time.  Cont current dose of lasix and f/u bmet today.  2.  Stable angina/CAD:  As above, he continues to have DOE.  He says that he had similar Ss prior to his CABG in the past.  I suspect that DOE is his anginal equivalent.  We discussed pursuing diagnostic catheterization to define his anatomy.  He had 3vd with an 80% LCX and 4/4 patent grafts on cath in 2013.  He would like to avoid cath if possible.  As a result, I have added Imdur 30mg  po daily today.  I asked him to call us on Friday or sooner to report how he is doing on long acting nitrate therapy so that we may adjust his dose if necessary.  I will see him back in clinic in 7-10 days @ which point we can reconsider cath if necessary.  Cont asa, statin.  3.  HTN: BP elevated today. Runs better @ home.  Adding imdur.  He will continue to follow.  4.  HL:  On statin.  5.  Dispo:  Insulin per PCP.  6.  Dispo:  Pt to call w/ Ss and BP's on Friday @ which time we will consider titrating imdur.  F/U 7-10 days or sooner if necessary.  BMET today.    Murray Hodgkins, NP 05/06/2014, 2:30 PM

## 2014-05-06 NOTE — Patient Instructions (Signed)
Your physician recommends that you have labs today:  BMP   Your physician has recommended you make the following change in your medication:  Start Imdur 30 mg daily   Please call us on Friday and let us know how you are feeling   Your physician recommends that you schedule a follow-up appointment in:  7-10 days with Dr. Rockey Situ or Ignacia Bayley NP (on a day Dr. Rockey Situ is here)

## 2014-05-07 LAB — BASIC METABOLIC PANEL
BUN/Creatinine Ratio: 24 — ABNORMAL HIGH (ref 10–22)
BUN: 27 mg/dL (ref 8–27)
CO2: 23 mmol/L (ref 18–29)
Calcium: 8.7 mg/dL (ref 8.6–10.2)
Chloride: 98 mmol/L (ref 97–108)
Creatinine, Ser: 1.13 mg/dL (ref 0.76–1.27)
GFR calc Af Amer: 68 mL/min/{1.73_m2} (ref >59)
GFR, EST NON AFRICAN AMERICAN: 59 mL/min/{1.73_m2} — AB (ref >59)
GLUCOSE: 184 mg/dL — AB (ref 65–99)
Potassium: 3.8 mmol/L (ref 3.5–5.2)
Sodium: 142 mmol/L (ref 134–144)

## 2014-05-09 ENCOUNTER — Telehealth: Payer: Self-pay | Admitting: *Deleted

## 2014-05-09 NOTE — Telephone Encounter (Signed)
Cont imdur.  He has f/u with Dr. Rockey Situ on the 23rd.  I suspect he will need a cath.

## 2014-05-09 NOTE — Telephone Encounter (Signed)
Patient called and doesn't noticed much of a difference in the new medicine (Isosorab 30mg )

## 2014-05-09 NOTE — Telephone Encounter (Signed)
Spoke w/ pt.  He reports that since starting Imdur on 6/16, he does not notice a difference in the way he feels.  Reports BP 140/52 on Wed and 138/57 yesterday.  States that he was told to call if he was still "running out of energy with exertion".  Pt reports that sx have not improved and he would like to know what to do next.  Please advise.   Thank you.

## 2014-05-12 NOTE — Telephone Encounter (Signed)
Spoke w/ pt.  Advised him of Chris's recommendation.  He will keep his appt tomorrow w/ Dr. Rockey Situ.

## 2014-05-13 ENCOUNTER — Encounter: Payer: Self-pay | Admitting: Cardiovascular Disease

## 2014-05-13 ENCOUNTER — Ambulatory Visit (INDEPENDENT_AMBULATORY_CARE_PROVIDER_SITE_OTHER): Payer: Commercial Managed Care - HMO | Admitting: Cardiovascular Disease

## 2014-05-13 VITALS — BP 140/62 | HR 62 | Ht 67.0 in | Wt 229.2 lb

## 2014-05-13 DIAGNOSIS — I25810 Atherosclerosis of coronary artery bypass graft(s) without angina pectoris: Secondary | ICD-10-CM

## 2014-05-13 DIAGNOSIS — I5043 Acute on chronic combined systolic (congestive) and diastolic (congestive) heart failure: Secondary | ICD-10-CM | POA: Insufficient documentation

## 2014-05-13 DIAGNOSIS — I10 Essential (primary) hypertension: Secondary | ICD-10-CM

## 2014-05-13 DIAGNOSIS — R609 Edema, unspecified: Secondary | ICD-10-CM

## 2014-05-13 DIAGNOSIS — E785 Hyperlipidemia, unspecified: Secondary | ICD-10-CM

## 2014-05-13 DIAGNOSIS — I5033 Acute on chronic diastolic (congestive) heart failure: Secondary | ICD-10-CM

## 2014-05-13 DIAGNOSIS — I509 Heart failure, unspecified: Secondary | ICD-10-CM

## 2014-05-13 DIAGNOSIS — I5041 Acute combined systolic (congestive) and diastolic (congestive) heart failure: Secondary | ICD-10-CM | POA: Insufficient documentation

## 2014-05-13 MED ORDER — FUROSEMIDE 40 MG PO TABS: 80.0000 mg | ORAL_TABLET | Freq: Two times a day (BID) | ORAL | Status: DC

## 2014-05-13 NOTE — Assessment & Plan Note (Signed)
Continues to have significant edema, worse from his prior clinic visit, up 7 pounds. CHF management as above

## 2014-05-13 NOTE — Assessment & Plan Note (Signed)
Blood pressure is well controlled on today's visit. No changes made to the medications. 

## 2014-05-13 NOTE — Assessment & Plan Note (Addendum)
We spent most of his visit today talking about his fluid intake. He is not monitoring his fluids, not checking his weight. We tried to do CHF education with him. He denies adding extra salt. He will try to decrease his fluid intake. We have recommended he buy a scale for home. Recommended he follow the algorithm below For weight >224, Please take lasix 80 mg twice a day For weight >219, please take lasix 80 mg in the AM and 40 mg in the afternoon For weight >215, please take lasix 40 mg twice a day

## 2014-05-13 NOTE — Patient Instructions (Addendum)
Please decrease your fluid intake  Please weight daily, you might need a new scale  For weight >224, Please take lasix 80 mg twice a day For weight >219, please take lasix 80 mg in the AM and 40 mg in the afternoon For weight >215, please take lasix 40 mg twice a day  Please call us if you have new issues that need to be addressed before your next appt.  Your physician wants you to follow-up in: 1 month.

## 2014-05-13 NOTE — Assessment & Plan Note (Signed)
Recommended that he stay on his Lipitor

## 2014-05-13 NOTE — Assessment & Plan Note (Signed)
We have encouraged continued exercise, careful diet management in an effort to lose weight. 

## 2014-05-13 NOTE — Assessment & Plan Note (Signed)
We will continue to work on his heart failure management at this time. If he achieves optimal fluid status and continues to have symptoms, should consider general deconditioning and the need for cardiac rehabilitation. Could consider cardiac catheterization to rule out ischemia

## 2014-05-13 NOTE — Progress Notes (Signed)
Patient ID: AMYR SLUDER, male    DOB: Apr 16, 1925, 80 y.o.   MRN: [id number]  HPI Comments: Mr. Silvers is a 80 year old gentleman with a hx of CAD, CABG in 2006, PAD with reported stenosis in the past requiring intervention (details unavailable),  followed by Dr.  Lucky Cowboy, obesity, hyperlipidemia, arthritis of the knees, who previously presented with increasing fatigue, SOB, leg weakness,  Prior episodes of lower extremity edema, now presenting for routine followup.  He is a patient of Dr. Rosanna Randy.  He presents today for followup of his diastolic CHF, acute on chronic Since his prior clinic visit, he is up 7 pounds. He is not weighing himself at home in fact only has an old scale. He is drinking significant fluids. He cannot understand why his legs were feeling tight, very sore. He has not felt any marked difference with the isosorbide 30 mg which she's been taking daily. Edema now extending up into his thighs Reports having significant leg weakness when he walks. Denies having any chest pain, shortness of breath.  He is not exercising on a regular basis.   April 2015 total cholesterol 153, LDL 82, hemoglobin A1c improved now down to 7.0, creatinine 1.1 with BUN 15. Ultrasound of the legs showing patent right SFA stent, left side CFA and popliteal artery with good flow, waveform deterioration around the ankle on the left  Last Cardiac catheterization was performed at [facility] 08/16/2012 Catheterization showed severe three-vessel coronary artery disease with patent grafts x4. There was 80% disease of a small to moderate sized distal left circumflex. Ejection fraction 45-50%. No significant aortic valve stenosis or mitral valve regurgitation. The findings were discussed with interventional cardiology and medical management was recommended for the distal left circumflex disease.   previous Echocardiogram did not show elevated right-sided pressures and ejection fraction was normal.  Previous episode of  tachycardia in June 2013 that resolved without intervention   Outpatient Encounter Prescriptions as of 05/13/2014  Medication Sig  . aspirin (ASPIR-81) 81 MG EC tablet Take 81 mg by mouth daily.    Marland Kitchen atorvastatin (LIPITOR) 10 MG tablet Take 10 mg by mouth daily.  . cholecalciferol (VITAMIN D) 1000 UNITS tablet Take 2,000 Units by mouth daily.  Marland Kitchen doxazosin (CARDURA) 4 MG tablet Take 1 tablet (4 mg total) by mouth 2 (two) times daily.  . furosemide (LASIX) 40 MG tablet Take 1 tablet (40 mg total) by mouth daily.  . insulin aspart (NOVOLOG) 100 UNIT/ML injection Inject into the skin 3 (three) times daily before meals. 26-16-28  . insulin glargine (LANTUS) 100 UNIT/ML injection Inject 70 Units into the skin at bedtime.   . isosorbide mononitrate (IMDUR) 30 MG 24 hr tablet Take 1 tablet (30 mg total) by mouth daily.  . Levothyroxine Sodium 25 MCG CAPS Take by mouth daily.    Marland Kitchen lisinopril (PRINIVIL,ZESTRIL) 40 MG tablet Take 40 mg by mouth daily.  . nitroGLYCERIN (NITROSTAT) 0.4 MG SL tablet Place 1 tablet (0.4 mg total) under the tongue every 5 (five) minutes as needed for chest pain.  Marland Kitchen omeprazole (PRILOSEC) 20 MG capsule Take 20 mg by mouth daily.    . prednisoLONE acetate (PRED FORTE) 1 % ophthalmic suspension Place 1 drop into the left eye daily.   Review of Systems  Constitutional: Negative.   HENT: Negative.   Eyes: Negative.   Respiratory: Negative.   Cardiovascular: Positive for leg swelling.  Gastrointestinal: Negative.   Endocrine: Negative.   Musculoskeletal: Positive for gait problem and myalgias.  Legs are heavy  Skin: Negative.   Allergic/Immunologic: Negative.   Neurological: Negative.   Hematological: Negative.   Psychiatric/Behavioral: Negative.   All other systems reviewed and are negative.   BP 140/62  Pulse 62  Ht 5\' 7"  (1.702 m)  Wt 229 lb 4 oz (103.987 kg)  BMI 35.90 kg/m2  Physical Exam  Nursing note and vitals reviewed. Constitutional: He is  oriented to person, place, and time. He appears well-developed and well-nourished.   obese  HENT:  Head: Normocephalic.  Nose: Nose normal.  Mouth/Throat: Oropharynx is clear and moist.  Eyes: Conjunctivae are normal. Pupils are equal, round, and reactive to light.  Neck: Normal range of motion. Neck supple. No JVD present.  Cardiovascular: Normal rate, regular rhythm, S1 normal, S2 normal, normal heart sounds and intact distal pulses.  Exam reveals no gallop, no friction rub and no decreased pulses.   No murmur heard. Pulses:      Carotid pulses are 2+ on the right side, and 2+ on the left side.      Radial pulses are 2+ on the right side, and 2+ on the left side.  1+ pitting edema to below the knees bilaterally  Pulmonary/Chest: Effort normal and breath sounds normal. No respiratory distress. He has no wheezes. He has no rales. He exhibits no tenderness.  Abdominal: Soft. Bowel sounds are normal. He exhibits no distension. There is no tenderness.  Musculoskeletal: Normal range of motion. He exhibits edema. He exhibits no tenderness.  Lymphadenopathy:    He has no cervical adenopathy.  Neurological: He is alert and oriented to person, place, and time. Coordination normal.  Skin: Skin is warm and dry. No rash noted. No erythema.  Psychiatric: He has a normal mood and affect. His behavior is normal. Judgment and thought content normal.      Assessment and Plan

## 2014-05-16 ENCOUNTER — Telehealth: Payer: Self-pay

## 2014-05-16 NOTE — Telephone Encounter (Signed)
Pt called, to let Dr. Rockey Situ know, states he has lost a couple of pounds, states he doesn't think he is going to the bathroom enough, states he went 8 times yesterday, and so far today went 4 times. Please call.

## 2014-05-16 NOTE — Telephone Encounter (Signed)
Spoke w/ pt.  He reports that he weighs 218 today on his new scales.  He is concerned that he has not been to the restroom as often today as yesterday, has decreased his fluids as instructed.  Advised pt to continue to monitor his wt and follow the directions regarding his lasix that were given to him at last ov and not to base this on the amount of trips he makes to restroom.  He verbalizes understanding and will call w/ further questions or concerns.

## 2014-06-09 ENCOUNTER — Encounter: Payer: Self-pay | Admitting: Cardiovascular Disease

## 2014-06-09 ENCOUNTER — Ambulatory Visit: Payer: Medicare PPO | Admitting: Cardiovascular Disease

## 2014-06-09 ENCOUNTER — Ambulatory Visit (INDEPENDENT_AMBULATORY_CARE_PROVIDER_SITE_OTHER): Payer: Commercial Managed Care - HMO | Admitting: Cardiovascular Disease

## 2014-06-09 VITALS — BP 176/70 | HR 60 | Ht 67.0 in | Wt 222.0 lb

## 2014-06-09 DIAGNOSIS — I509 Heart failure, unspecified: Secondary | ICD-10-CM

## 2014-06-09 DIAGNOSIS — E119 Type 2 diabetes mellitus without complications: Secondary | ICD-10-CM

## 2014-06-09 DIAGNOSIS — E785 Hyperlipidemia, unspecified: Secondary | ICD-10-CM

## 2014-06-09 DIAGNOSIS — R609 Edema, unspecified: Secondary | ICD-10-CM

## 2014-06-09 DIAGNOSIS — I25810 Atherosclerosis of coronary artery bypass graft(s) without angina pectoris: Secondary | ICD-10-CM

## 2014-06-09 DIAGNOSIS — I5033 Acute on chronic diastolic (congestive) heart failure: Secondary | ICD-10-CM

## 2014-06-09 DIAGNOSIS — R600 Localized edema: Secondary | ICD-10-CM

## 2014-06-09 NOTE — Assessment & Plan Note (Signed)
We have encouraged continued exercise, careful diet management in an effort to lose weight. 

## 2014-06-09 NOTE — Progress Notes (Signed)
Patient ID: DRAGO HAMMONDS, male    DOB: 11-20-1925, 80 y.o.   MRN: [id number]  HPI Comments: Mr. Nocera is a 80 year old gentleman with a hx of CAD, CABG in 2006, PAD with reported stenosis in the past requiring intervention (details unavailable),  followed by Dr.  Lucky Cowboy, obesity, hyperlipidemia, arthritis of the knees, who previously presented with increasing fatigue, SOB, leg weakness,  Prior episodes of lower extremity edema, now presenting for routine followup.  He is a patient of Dr. Rosanna Randy.  He presents today for followup of his diastolic CHF, acute on chronic Since his prior clinic visit, he is up 7 pounds. He is not weighing himself at home in fact only has an old scale. He is drinking significant fluids. He cannot understand why his legs were feeling tight, very sore. He has not felt any marked difference with the isosorbide 30 mg which she's been taking daily. Edema now extending up into his thighs Reports having significant leg weakness when he walks. Denies having any chest pain, shortness of breath.  He is not exercising on a regular basis.   April 2015 total cholesterol 153, LDL 82, hemoglobin A1c improved now down to 7.0, creatinine 1.1 with BUN 15. Ultrasound of the legs showing patent right SFA stent, left side CFA and popliteal artery with good flow, waveform deterioration around the ankle on the left  Last Cardiac catheterization was performed at [facility] 08/16/2012 Catheterization showed severe three-vessel coronary artery disease with patent grafts x4. There was 80% disease of a small to moderate sized distal left circumflex. Ejection fraction 45-50%. No significant aortic valve stenosis or mitral valve regurgitation. The findings were discussed with interventional cardiology and medical management was recommended for the distal left circumflex disease.   previous Echocardiogram did not show elevated right-sided pressures and ejection fraction was normal.  Previous episode of  tachycardia in June 2013 that resolved without intervention   Outpatient Encounter Prescriptions as of 06/09/2014  Medication Sig  . aspirin (ASPIR-81) 81 MG EC tablet Take 81 mg by mouth daily.    Marland Kitchen atorvastatin (LIPITOR) 10 MG tablet Take 10 mg by mouth daily.  . cholecalciferol (VITAMIN D) 1000 UNITS tablet Take 2,000 Units by mouth daily.  Marland Kitchen doxazosin (CARDURA) 4 MG tablet Take 1 tablet (4 mg total) by mouth 2 (two) times daily.  . furosemide (LASIX) 40 MG tablet Take 2 tablets (80 mg total) by mouth 2 (two) times daily.  . insulin aspart (NOVOLOG) 100 UNIT/ML injection Inject into the skin 3 (three) times daily before meals. 26-16-28  . insulin glargine (LANTUS) 100 UNIT/ML injection Inject 70 Units into the skin at bedtime.   . isosorbide mononitrate (IMDUR) 30 MG 24 hr tablet Take 1 tablet (30 mg total) by mouth daily.  . Levothyroxine Sodium 25 MCG CAPS Take by mouth daily.    Marland Kitchen lisinopril (PRINIVIL,ZESTRIL) 40 MG tablet Take 40 mg by mouth daily.  . nitroGLYCERIN (NITROSTAT) 0.4 MG SL tablet Place 1 tablet (0.4 mg total) under the tongue every 5 (five) minutes as needed for chest pain.  Marland Kitchen omeprazole (PRILOSEC) 20 MG capsule Take 20 mg by mouth daily.    . prednisoLONE acetate (PRED FORTE) 1 % ophthalmic suspension Place 1 drop into the left eye daily.   Review of Systems  Constitutional: Negative.   HENT: Negative.   Eyes: Negative.   Respiratory: Negative.   Cardiovascular: Positive for leg swelling.  Gastrointestinal: Negative.   Endocrine: Negative.   Musculoskeletal: Positive for gait problem  and myalgias.  Skin: Negative.   Allergic/Immunologic: Negative.   Neurological: Negative.   Hematological: Negative.   Psychiatric/Behavioral: Negative.   All other systems reviewed and are negative.   BP 176/70  Pulse 60  Ht 5\' 7"  (1.702 m)  Wt 222 lb (100.699 kg)  BMI 34.76 kg/m2 Improved on recheck to 921 systolic Physical Exam  Nursing note and vitals  reviewed. Constitutional: He is oriented to person, place, and time. He appears well-developed and well-nourished.   obese  HENT:  Head: Normocephalic.  Nose: Nose normal.  Mouth/Throat: Oropharynx is clear and moist.  Eyes: Conjunctivae are normal. Pupils are equal, round, and reactive to light.  Neck: Normal range of motion. Neck supple. No JVD present.  Cardiovascular: Normal rate, regular rhythm, S1 normal, S2 normal, normal heart sounds and intact distal pulses.  Exam reveals no gallop, no friction rub and no decreased pulses.   No murmur heard. Pulses:      Carotid pulses are 2+ on the right side, and 2+ on the left side.      Radial pulses are 2+ on the right side, and 2+ on the left side.  Trace+ pitting edema to the mid shin bilaterally  Pulmonary/Chest: Effort normal and breath sounds normal. No respiratory distress. He has no wheezes. He has no rales. He exhibits no tenderness.  Abdominal: Soft. Bowel sounds are normal. He exhibits no distension. There is no tenderness.  Musculoskeletal: Normal range of motion. He exhibits edema. He exhibits no tenderness.  Lymphadenopathy:    He has no cervical adenopathy.  Neurological: He is alert and oriented to person, place, and time. Coordination normal.  Skin: Skin is warm and dry. No rash noted. No erythema.  Psychiatric: He has a normal mood and affect. His behavior is normal. Judgment and thought content normal.      Assessment and Plan

## 2014-06-09 NOTE — Assessment & Plan Note (Signed)
Cholesterol is at goal on the current lipid regimen. No changes to the medications were made.  

## 2014-06-09 NOTE — Patient Instructions (Signed)
You are doing well. No medication changes were made.  For weight >222, Please take lasix 80 mg twice a day  For weight >220, please take lasix 80 mg in the AM and 40 mg in the afternoon  For weight >218, please take lasix 40 mg twice a day For weight less than 216, hold the lasix    Please call us if you have new issues that need to be addressed before your next appt.  Your physician wants you to follow-up in: 3 months.  You will receive a reminder letter in the mail two months in advance. If you don't receive a letter, please call our office to schedule the follow-up appointment.

## 2014-06-09 NOTE — Assessment & Plan Note (Addendum)
Weight is down 7 pounds from last month. Leg edema much improved.  still with trace edema. Suspect he still has an additional 3-4 pounds to go. He is still complaining of leg tightness. He does not appreciate a dramatic improvement compared to his prior clinic visit. We discussed his prior weights with him. In September 2014 his weight was 219. Weight was 229 pounds last month, now 222 pounds. We have recommended he follow the Lasix algorithm we have given him. Lasix 80 mg twice a day, down to 40 mg as his weight decreases, holding Lasix for weight close to 216 pounds.

## 2014-06-09 NOTE — Assessment & Plan Note (Signed)
Currently with no symptoms of angina. No further workup at this time. Continue current medication regimen. 

## 2014-06-09 NOTE — Assessment & Plan Note (Signed)
Significantly improved as detailed above

## 2014-06-12 ENCOUNTER — Ambulatory Visit: Payer: Commercial Managed Care - HMO | Admitting: Cardiovascular Disease

## 2014-06-13 ENCOUNTER — Ambulatory Visit (INDEPENDENT_AMBULATORY_CARE_PROVIDER_SITE_OTHER): Payer: Medicare HMO | Admitting: Podiatry

## 2014-06-13 ENCOUNTER — Encounter: Payer: Self-pay | Admitting: Podiatry

## 2014-06-13 DIAGNOSIS — M79676 Pain in unspecified toe(s): Secondary | ICD-10-CM

## 2014-06-13 DIAGNOSIS — B351 Tinea unguium: Secondary | ICD-10-CM

## 2014-06-13 DIAGNOSIS — M79609 Pain in unspecified limb: Secondary | ICD-10-CM

## 2014-06-14 NOTE — Progress Notes (Signed)
Subjective:     Patient ID: Joseph [name], male   DOB: 02-02-1925, 80 y.o.   MRN: [id number]  HPI patient presents with thick yellow nailbeds 1-5 both feet that are painful   Review of Systems     Objective:   Physical Exam Neurovascular status intact with mycotic thick yellow nailbeds 1-5 both feet that are painful    Assessment:     Mycotic nail infection is with pain 1-5 both feet    Plan:     Debris painful nailbeds 1-5 both feet with no iatrogenic bleeding noted

## 2014-06-16 ENCOUNTER — Ambulatory Visit: Payer: Medicare PPO | Admitting: Cardiovascular Disease

## 2014-06-24 ENCOUNTER — Ambulatory Visit: Payer: Medicare HMO | Admitting: Podiatry

## 2014-09-09 ENCOUNTER — Ambulatory Visit (INDEPENDENT_AMBULATORY_CARE_PROVIDER_SITE_OTHER): Payer: Commercial Managed Care - HMO | Admitting: Cardiovascular Disease

## 2014-09-09 ENCOUNTER — Encounter: Payer: Self-pay | Admitting: Cardiovascular Disease

## 2014-09-09 VITALS — BP 110/52 | HR 70 | Ht 67.0 in | Wt 225.8 lb

## 2014-09-09 DIAGNOSIS — R609 Edema, unspecified: Secondary | ICD-10-CM

## 2014-09-09 DIAGNOSIS — R0602 Shortness of breath: Secondary | ICD-10-CM

## 2014-09-09 DIAGNOSIS — E785 Hyperlipidemia, unspecified: Secondary | ICD-10-CM

## 2014-09-09 DIAGNOSIS — I25810 Atherosclerosis of coronary artery bypass graft(s) without angina pectoris: Secondary | ICD-10-CM

## 2014-09-09 DIAGNOSIS — R29898 Other symptoms and signs involving the musculoskeletal system: Secondary | ICD-10-CM

## 2014-09-09 DIAGNOSIS — I5033 Acute on chronic diastolic (congestive) heart failure: Secondary | ICD-10-CM

## 2014-09-09 DIAGNOSIS — E669 Obesity, unspecified: Secondary | ICD-10-CM

## 2014-09-09 DIAGNOSIS — I10 Essential (primary) hypertension: Secondary | ICD-10-CM

## 2014-09-09 NOTE — Patient Instructions (Addendum)
Your next appointment will be scheduled in our new office located at :  [facility]  [address] [place], [place] [address]  You are doing well. No medication changes were made.  Please call us if you have new issues that need to be addressed before your next appt.  Your physician wants you to follow-up in: 6 months.  You will receive a reminder letter in the mail two months in advance. If you don't receive a letter, please call our office to schedule the follow-up appointment.

## 2014-09-09 NOTE — Assessment & Plan Note (Signed)
Cholesterol close to goal. Recommended weight loss to achieve goal LDL less than 70

## 2014-09-09 NOTE — Assessment & Plan Note (Signed)
Blood pressure is well controlled on today's visit. No changes made to the medications. 

## 2014-09-09 NOTE — Assessment & Plan Note (Signed)
We have encouraged continued exercise, careful diet management in an effort to lose weight. 

## 2014-09-09 NOTE — Assessment & Plan Note (Signed)
Currently with no symptoms of angina. No further workup at this time. Continue current medication regimen. 

## 2014-09-09 NOTE — Assessment & Plan Note (Signed)
We spent a long time talking about his need to start a regular walking program. He is unable to walk as they feel tired. Suspect his leg fatigue could be secondary to inactivity

## 2014-09-09 NOTE — Assessment & Plan Note (Signed)
Weight is stable at 225 pounds. Encouraged him to closely monitor his weight, fluid intake, maintain weight at 225 pounds, continue Lasix 80 mg in the morning and 40 mg in the evening with extra Lasix as needed for weight gain. He will need compression hose

## 2014-09-09 NOTE — Assessment & Plan Note (Signed)
He is troubled by the trace edema around his ankles. I suspect he has mild chronic venous insufficiency secondary to vein harvesting for his bypass surgery. Recommended compression hose

## 2014-09-09 NOTE — Progress Notes (Signed)
Patient ID: Joseph [name], male    DOB: 1924-12-02, 80 y.o.   MRN: [id number]  HPI Comments: Joseph [name] is a 80 year old gentleman with a hx of CAD, CABG in 1994 per the wife, PAD with reported stenosis in the past requiring intervention (details unavailable),  followed by Dr.  Lucky Cowboy, obesity, hyperlipidemia, arthritis of the knees, who previously presented with increasing fatigue, SOB, leg weakness,  Prior episodes of lower extremity edema, now presenting for routine followup.  He is a patient of Dr. Rosanna Randy.  He presents today for followup of his diastolic CHF, acute on chronic Weight has been stable at 225 pounds. He is taking Lasix 80 mg in the morning, 40 mg in the evening. He reports that he has changed his diet, no longer uses salt.   He does not do any regular exercise, legs have been getting weaker. Wife has been trying to get him to do more. He complains that his legs get tired when he walks. Continues to complain that his ankles still feel tight. Minimal edema  Denies having any chest pain, shortness of breath.  April 2015 total cholesterol 153, LDL 82, hemoglobin A1c improved now down to 7.0, creatinine 1.1 with BUN 15. Ultrasound of the legs showing patent right SFA stent, left side CFA and popliteal artery with good flow, waveform deterioration around the ankle on the left  Last Cardiac catheterization was performed at [facility] 08/16/2012 Catheterization showed severe three-vessel coronary artery disease with patent grafts x4. There was 80% disease of a small to moderate sized distal left circumflex. Ejection fraction 45-50%. No significant aortic valve stenosis or mitral valve regurgitation. The findings were discussed with interventional cardiology and medical management was recommended for the distal left circumflex disease.   previous Echocardiogram did not show elevated right-sided pressures and ejection fraction was normal.  Previous episode of tachycardia in June 2013 that  resolved without intervention  EKG shows normal sinus rhythm with rate 70 beats per minute, intraventricular conduction delay, left axis deviation, APCs Total cholesterol 143, LDL 82   Outpatient Encounter Prescriptions as of 09/09/2014  Medication Sig  . amLODipine (NORVASC) 10 MG tablet Take 10 mg by mouth daily.  Marland Kitchen aspirin (ASPIR-81) 81 MG EC tablet Take 81 mg by mouth daily.    Marland Kitchen atorvastatin (LIPITOR) 10 MG tablet Take 10 mg by mouth daily.  . cholecalciferol (VITAMIN D) 1000 UNITS tablet Take 2,000 Units by mouth daily.  Marland Kitchen doxazosin (CARDURA) 4 MG tablet Take 1 tablet (4 mg total) by mouth 2 (two) times daily.  . furosemide (LASIX) 40 MG tablet Take two tablets in the am & one tablet in the pm.  . insulin aspart (NOVOLOG) 100 UNIT/ML injection Inject into the skin 3 (three) times daily before meals. 26-16-28  . insulin glargine (LANTUS) 100 UNIT/ML injection Inject 70 Units into the skin at bedtime.   . isosorbide mononitrate (IMDUR) 30 MG 24 hr tablet Take 1 tablet (30 mg total) by mouth daily.  Marland Kitchen levothyroxine (SYNTHROID, LEVOTHROID) 50 MCG tablet Take 50 mcg by mouth daily before breakfast.  . lisinopril (PRINIVIL,ZESTRIL) 40 MG tablet Take 40 mg by mouth daily.  . nitroGLYCERIN (NITROSTAT) 0.4 MG SL tablet Place 1 tablet (0.4 mg total) under the tongue every 5 (five) minutes as needed for chest pain.  Marland Kitchen omeprazole (PRILOSEC) 20 MG capsule Take 20 mg by mouth daily.    . prednisoLONE acetate (PRED FORTE) 1 % ophthalmic suspension Place 1 drop into the left eye daily.  Review of Systems  Constitutional: Negative.   HENT: Negative.   Eyes: Negative.   Respiratory: Negative.   Cardiovascular: Positive for leg swelling.  Gastrointestinal: Negative.   Endocrine: Negative.   Musculoskeletal: Positive for gait problem and myalgias.  Skin: Negative.   Allergic/Immunologic: Negative.   Neurological: Negative.   Hematological: Negative.   Psychiatric/Behavioral: Negative.   All  other systems reviewed and are negative.   BP 110/52  Pulse 70  Ht 5\' 7"  (1.702 m)  Wt 225 lb 12 oz (102.4 kg)  BMI 35.35 kg/m2  Physical Exam  Nursing note and vitals reviewed. Constitutional: He is oriented to person, place, and time. He appears well-developed and well-nourished.   obese  HENT:  Head: Normocephalic.  Nose: Nose normal.  Mouth/Throat: Oropharynx is clear and moist.  Eyes: Conjunctivae are normal. Pupils are equal, round, and reactive to light.  Neck: Normal range of motion. Neck supple. No JVD present.  Cardiovascular: Normal rate, regular rhythm, S1 normal, S2 normal, normal heart sounds and intact distal pulses.  Exam reveals no gallop, no friction rub and no decreased pulses.   No murmur heard. Pulses:      Carotid pulses are 2+ on the right side, and 2+ on the left side.      Radial pulses are 2+ on the right side, and 2+ on the left side.  Trace+ pitting edema around the ankles bilaterally  Pulmonary/Chest: Effort normal and breath sounds normal. No respiratory distress. He has no wheezes. He has no rales. He exhibits no tenderness.  Abdominal: Soft. Bowel sounds are normal. He exhibits no distension. There is no tenderness.  Musculoskeletal: Normal range of motion. He exhibits edema. He exhibits no tenderness.  Lymphadenopathy:    He has no cervical adenopathy.  Neurological: He is alert and oriented to person, place, and time. Coordination normal.  Skin: Skin is warm and dry. No rash noted. No erythema.  Psychiatric: He has a normal mood and affect. His behavior is normal. Judgment and thought content normal.      Assessment and Plan

## 2015-01-09 ENCOUNTER — Other Ambulatory Visit: Payer: Self-pay | Admitting: Cardiovascular Disease

## 2015-03-10 ENCOUNTER — Ambulatory Visit (INDEPENDENT_AMBULATORY_CARE_PROVIDER_SITE_OTHER): Payer: PPO | Admitting: Cardiovascular Disease

## 2015-03-10 ENCOUNTER — Encounter: Payer: Self-pay | Admitting: Cardiovascular Disease

## 2015-03-10 VITALS — BP 128/60 | HR 59 | Ht 67.0 in | Wt 228.0 lb

## 2015-03-10 DIAGNOSIS — R42 Dizziness and giddiness: Secondary | ICD-10-CM

## 2015-03-10 DIAGNOSIS — I5033 Acute on chronic diastolic (congestive) heart failure: Secondary | ICD-10-CM

## 2015-03-10 DIAGNOSIS — I10 Essential (primary) hypertension: Secondary | ICD-10-CM

## 2015-03-10 DIAGNOSIS — I25810 Atherosclerosis of coronary artery bypass graft(s) without angina pectoris: Secondary | ICD-10-CM | POA: Diagnosis not present

## 2015-03-10 DIAGNOSIS — E785 Hyperlipidemia, unspecified: Secondary | ICD-10-CM

## 2015-03-10 DIAGNOSIS — E669 Obesity, unspecified: Secondary | ICD-10-CM

## 2015-03-10 DIAGNOSIS — E119 Type 2 diabetes mellitus without complications: Secondary | ICD-10-CM

## 2015-03-10 DIAGNOSIS — R29898 Other symptoms and signs involving the musculoskeletal system: Secondary | ICD-10-CM

## 2015-03-10 NOTE — Assessment & Plan Note (Signed)
Currently with no symptoms of angina. No further workup at this time. Continue current medication regimen. 

## 2015-03-10 NOTE — Assessment & Plan Note (Signed)
Goal LDL less than 70. No recent lipid panel available, last was 2 years ago at that time LDL was 80. May benefit from Lipitor 20 mg daily

## 2015-03-10 NOTE — Assessment & Plan Note (Signed)
He appears relatively euvolemic on today's visit. Weight relatively stable. Recommended he stay on his Lasix 80 mg in the morning, 40 mg after lunch

## 2015-03-10 NOTE — Assessment & Plan Note (Signed)
Obesity continues to be a major issue. Suspect dietary indiscretion

## 2015-03-10 NOTE — Patient Instructions (Signed)
You are doing well. No medication changes were made.  Please call us if you have new issues that need to be addressed before your next appt.  Your physician wants you to follow-up in: 6 months.  You will receive a reminder letter in the mail two months in advance. If you don't receive a letter, please call our office to schedule the follow-up appointment.   

## 2015-03-10 NOTE — Assessment & Plan Note (Signed)
Again he continues to complain of leg weakness. Recommended a regular walking program

## 2015-03-10 NOTE — Assessment & Plan Note (Signed)
Blood pressure is well controlled on today's visit. No changes made to the medications. 

## 2015-03-10 NOTE — Assessment & Plan Note (Signed)
We have encouraged continued exercise, careful diet management in an effort to lose weight. 

## 2015-03-10 NOTE — Progress Notes (Signed)
Patient ID: Joseph [name], male    DOB: [birth year], 81 y.o.   MRN: [id number]  HPI Comments: Joseph [name] is a 81 year old gentleman with a hx of CAD, CABG in 1994 per the wife, PAD with reported stenosis in the past requiring intervention (details unavailable),  followed by Dr.  Lucky Cowboy, obesity, hyperlipidemia, arthritis of the knees, who previously presented with increasing fatigue, SOB, leg weakness,  Prior episodes of lower extremity edema, now presenting for routine followup of his coronary artery disease and history of acute on chronic diastolic CHF.  He is a patient of Dr. Rosanna Randy.  In follow-up, he reports that he is doing well. No regular exercise program. Very sedentary at home. Legs continue to feel very weak, tired when he walks. Often has to rest for 1 minute for is able to walk on further Unchanged from his prior clinic visit Reports his weight is about the same, continues to take Lasix 80 mg in the morning, 40 mg in the evening (same medication regimen as his last clinic visit) On today's visit, ankles and legs are less swollen, has minimal swelling  Denies having any chest pain, shortness of breath.  EKG on today's visit shows normal sinus rhythm with rate 59 bpm, intraventricular conduction delay, unable to exclude old anterior MI, left anterior fascicular block  Other past medical history April 2015 total cholesterol 153, LDL 82, hemoglobin A1c improved now down to 7.0, creatinine 1.1 with BUN 15. Ultrasound of the legs showing patent right SFA stent, left side CFA and popliteal artery with good flow, waveform deterioration around the ankle on the left  Last Cardiac catheterization was performed at [facility] 08/16/2012 Catheterization showed severe three-vessel coronary artery disease with patent grafts x4. There was 80% disease of a small to moderate sized distal left circumflex. Ejection fraction 45-50%. No significant aortic valve stenosis or mitral valve regurgitation. The  findings were discussed with interventional cardiology and medical management was recommended for the distal left circumflex disease.   previous Echocardiogram did not show elevated right-sided pressures and ejection fraction was normal.  Previous episode of tachycardia in June 2013 that resolved without intervention  Total cholesterol 143, LDL 82   No Known Allergies  Outpatient Encounter Prescriptions as of 03/10/2015  Medication Sig  . amLODipine (NORVASC) 10 MG tablet Take 10 mg by mouth daily.  Marland Kitchen atorvastatin (LIPITOR) 10 MG tablet Take 10 mg by mouth daily.  . cholecalciferol (VITAMIN D) 1000 UNITS tablet Take 2,000 Units by mouth daily.  Marland Kitchen doxazosin (CARDURA) 4 MG tablet TAKE ONE TABLET BY MOUTH TWICE DAILY  . furosemide (LASIX) 40 MG tablet Take two tablets in the am & one tablet in the pm.  . insulin aspart (NOVOLOG) 100 UNIT/ML injection Inject into the skin 3 (three) times daily before meals. 26-16-28  . insulin glargine (LANTUS) 100 UNIT/ML injection Inject 70 Units into the skin at bedtime.   . isosorbide mononitrate (IMDUR) 30 MG 24 hr tablet Take 1 tablet (30 mg total) by mouth daily.  Marland Kitchen levothyroxine (SYNTHROID, LEVOTHROID) 50 MCG tablet Take 50 mcg by mouth daily before breakfast.  . lisinopril (PRINIVIL,ZESTRIL) 40 MG tablet Take 40 mg by mouth daily.  . nitroGLYCERIN (NITROSTAT) 0.4 MG SL tablet Place 1 tablet (0.4 mg total) under the tongue every 5 (five) minutes as needed for chest pain.  Marland Kitchen omeprazole (PRILOSEC) 20 MG capsule Take 20 mg by mouth daily.    . prednisoLONE acetate (PRED FORTE) 1 % ophthalmic suspension Place 1 drop  into the left eye daily.  . [DISCONTINUED] aspirin (ASPIR-81) 81 MG EC tablet Take 81 mg by mouth daily.      Past Medical History  Diagnosis Date  . CAD (coronary artery disease)     a. s/p CABG;  b. 07/2012 Cath: 3VD including 80 dLCX (med Rx), 4/4 patent grafts.  . Diabetes mellitus, type 2   . Hyperlipidemia   . HTN (hypertension)   .  Hypothyroidism   . Chronic diastolic CHF (congestive heart failure)     a. 07/2012 Echo: EF 55-60%, no rwma, Gr 1 DD, mild MR;  04/2014 Echo: EF 55-60%, no rwma, mild MR, mildly dil LA.  Marland Kitchen Lower extremity edema     a. 03/2014 amlodipine d/c'd.    Past Surgical History  Procedure Laterality Date  . Prostate biopsy    . Hemorrhoid surgery    . Appendectomy    . Toe surgery      ingrown toe nail  . Coronary artery bypass graft  1994  . Cardiac catheterization  07/2012    [facility]/no stents  . Corneal transplant      Social History  reports that he quit smoking about 34 years ago. His smoking use included Cigarettes. He smoked 1.00 pack per day. He does not have any smokeless tobacco history on file. He reports that he does not drink alcohol or use illicit drugs.  Family History family history includes Heart attack in his father; Heart failure in his maternal grandfather, maternal uncle, and mother.       Review of Systems  Respiratory: Negative.   Cardiovascular: Negative.   Gastrointestinal: Negative.   Musculoskeletal: Positive for myalgias and gait problem.  Skin: Negative.   Neurological: Positive for weakness.  Hematological: Negative.   Psychiatric/Behavioral: Negative.   All other systems reviewed and are negative.   BP 128/60 mmHg  Pulse 59  Ht 5\' 7"  (1.702 m)  Wt 228 lb (103.42 kg)  BMI 35.70 kg/m2  Physical Exam  Constitutional: He is oriented to person, place, and time. He appears well-developed and well-nourished.   obese  HENT:  Head: Normocephalic.  Nose: Nose normal.  Mouth/Throat: Oropharynx is clear and moist.  Eyes: Conjunctivae are normal. Pupils are equal, round, and reactive to light.  Neck: Normal range of motion. Neck supple. No JVD present.  Cardiovascular: Normal rate, regular rhythm, S1 normal, S2 normal, normal heart sounds and intact distal pulses.  Exam reveals no gallop, no friction rub and no decreased pulses.   No murmur heard. Pulses:       Carotid pulses are 2+ on the right side, and 2+ on the left side.      Radial pulses are 2+ on the right side, and 2+ on the left side.  Trace+ pitting edema around the ankles bilaterally  Pulmonary/Chest: Effort normal and breath sounds normal. No respiratory distress. He has no wheezes. He has no rales. He exhibits no tenderness.  Abdominal: Soft. Bowel sounds are normal. He exhibits no distension. There is no tenderness.  Musculoskeletal: Normal range of motion. He exhibits edema. He exhibits no tenderness.  Lymphadenopathy:    He has no cervical adenopathy.  Neurological: He is alert and oriented to person, place, and time. Coordination normal.  Skin: Skin is warm and dry. No rash noted. No erythema.  Psychiatric: He has a normal mood and affect. His behavior is normal. Judgment and thought content normal.      Assessment and Plan   Nursing note and vitals  reviewed.

## 2015-03-24 ENCOUNTER — Other Ambulatory Visit: Payer: Self-pay | Admitting: Family Medicine

## 2015-03-24 ENCOUNTER — Ambulatory Visit
Admission: RE | Admit: 2015-03-24 | Discharge: 2015-03-24 | Disposition: A | Discharge status: Home / Self Care | Payer: PPO | Source: Ambulatory Visit | Attending: Family Medicine | Admitting: Family Medicine

## 2015-03-24 DIAGNOSIS — M47816 Spondylosis without myelopathy or radiculopathy, lumbar region: Secondary | ICD-10-CM | POA: Insufficient documentation

## 2015-03-24 DIAGNOSIS — M549 Dorsalgia, unspecified: Secondary | ICD-10-CM

## 2015-04-27 ENCOUNTER — Other Ambulatory Visit: Payer: Self-pay | Admitting: Family Medicine

## 2015-05-08 DIAGNOSIS — M47816 Spondylosis without myelopathy or radiculopathy, lumbar region: Secondary | ICD-10-CM | POA: Insufficient documentation

## 2015-05-18 ENCOUNTER — Other Ambulatory Visit: Payer: Self-pay

## 2015-07-06 LAB — BASIC METABOLIC PANEL
BUN: 16 mg/dL (ref 4–21)
CREATININE: 1 mg/dL (ref 0.6–1.3)
Glucose: 156 mg/dL
POTASSIUM: 4.4 mmol/L (ref 3.4–5.3)
SODIUM: 142 mmol/L (ref 137–147)

## 2015-07-06 LAB — CBC AND DIFFERENTIAL
HCT: 40 % — AB (ref 41–53)
Hemoglobin: 13.5 g/dL (ref 13.5–17.5)
Platelets: 176 10*3/uL (ref 150–399)
WBC: 7.9 10*3/mL

## 2015-07-06 LAB — LIPID PANEL
CHOLESTEROL: 148 mg/dL (ref 0–200)
HDL: 36 mg/dL (ref 35–70)
LDL Cholesterol: 91 mg/dL
Triglycerides: 191 mg/dL — AB (ref 40–160)

## 2015-07-06 LAB — HEMOGLOBIN A1C: Hgb A1c MFr Bld: 7.1 % — AB (ref 4.0–6.0)

## 2015-07-28 ENCOUNTER — Encounter: Payer: Self-pay | Admitting: Family Medicine

## 2015-07-28 ENCOUNTER — Ambulatory Visit (INDEPENDENT_AMBULATORY_CARE_PROVIDER_SITE_OTHER): Payer: PPO | Admitting: Family Medicine

## 2015-07-28 VITALS — BP 172/64 | HR 54 | Temp 97.8°F | Resp 14 | Wt 230.0 lb

## 2015-07-28 DIAGNOSIS — N400 Enlarged prostate without lower urinary tract symptoms: Secondary | ICD-10-CM | POA: Insufficient documentation

## 2015-07-28 DIAGNOSIS — I5033 Acute on chronic diastolic (congestive) heart failure: Secondary | ICD-10-CM | POA: Diagnosis not present

## 2015-07-28 DIAGNOSIS — L409 Psoriasis, unspecified: Secondary | ICD-10-CM | POA: Insufficient documentation

## 2015-07-28 DIAGNOSIS — M1990 Unspecified osteoarthritis, unspecified site: Secondary | ICD-10-CM | POA: Insufficient documentation

## 2015-07-28 DIAGNOSIS — I10 Essential (primary) hypertension: Secondary | ICD-10-CM

## 2015-07-28 DIAGNOSIS — M549 Dorsalgia, unspecified: Secondary | ICD-10-CM

## 2015-07-28 DIAGNOSIS — K219 Gastro-esophageal reflux disease without esophagitis: Secondary | ICD-10-CM | POA: Insufficient documentation

## 2015-07-28 DIAGNOSIS — E669 Obesity, unspecified: Secondary | ICD-10-CM

## 2015-07-28 DIAGNOSIS — E039 Hypothyroidism, unspecified: Secondary | ICD-10-CM | POA: Insufficient documentation

## 2015-07-28 DIAGNOSIS — G4452 New daily persistent headache (NDPH): Secondary | ICD-10-CM

## 2015-07-28 DIAGNOSIS — E1151 Type 2 diabetes mellitus with diabetic peripheral angiopathy without gangrene: Secondary | ICD-10-CM

## 2015-07-28 DIAGNOSIS — G629 Polyneuropathy, unspecified: Secondary | ICD-10-CM | POA: Insufficient documentation

## 2015-07-28 DIAGNOSIS — E291 Testicular hypofunction: Secondary | ICD-10-CM | POA: Insufficient documentation

## 2015-07-28 DIAGNOSIS — G8929 Other chronic pain: Secondary | ICD-10-CM | POA: Insufficient documentation

## 2015-07-28 MED ORDER — HYDRALAZINE HCL 25 MG PO TABS: 25.0000 mg | ORAL_TABLET | Freq: Two times a day (BID) | ORAL | Status: DC

## 2015-07-28 NOTE — Progress Notes (Signed)
Patient ID: Joseph [name], male   DOB: 1925-08-13, 81 y.o.   MRN: [id number]    Subjective:  HPI  Diabetes follow up: Patient was last seen in May. Last A1C was 7.1 when he had this checked with [facility] hospital. No changes were made on the last visit. Patient uses Novolog and Lantus   Insulin daily. Last eye exam: in August through [place]. Fasting sugars running around: 140-145. No hypoglycemic episodes  Routine labs with Lipids were done in August 2016 through [place].  Hypertension follow up: Patient is checking his B/P. Readings have been around: 170s/60s Patient is having headaches and gets a tingling sensation on top of his head, Gets SOB with exertion, patient states he does have CHF.  GERD follow up: Patient is taking Omeprazole daily. Symptoms are stable on the medication.  Prior to Admission medications   Medication Sig Start Date End Date Taking? Authorizing Provider  amLODipine (NORVASC) 10 MG tablet Take 10 mg by mouth daily.    Historical Provider, MD  atorvastatin (LIPITOR) 10 MG tablet Take 10 mg by mouth daily.    Historical Provider, MD  cholecalciferol (VITAMIN D) 1000 UNITS tablet Take 2,000 Units by mouth daily.    Historical Provider, MD  doxazosin (CARDURA) 4 MG tablet TAKE ONE TABLET BY MOUTH TWICE DAILY 01/12/15   Minna Merritts, MD  furosemide (LASIX) 40 MG tablet Take two tablets in the am & one tablet in the pm. 05/13/14   Minna Merritts, MD  insulin aspart (NOVOLOG) 100 UNIT/ML injection Inject into the skin 3 (three) times daily before meals. 26-16-28    Historical Provider, MD  insulin glargine (LANTUS) 100 UNIT/ML injection Inject 70 Units into the skin at bedtime.     Historical Provider, MD  isosorbide mononitrate (IMDUR) 30 MG 24 hr tablet Take 1 tablet (30 mg total) by mouth daily. 05/06/14   Rogelia Mire, NP  levothyroxine (SYNTHROID, LEVOTHROID) 50 MCG tablet Take 50 mcg by mouth daily before breakfast.    Historical Provider, MD  lisinopril  (PRINIVIL,ZESTRIL) 40 MG tablet Take 40 mg by mouth daily.    Historical Provider, MD  nitroGLYCERIN (NITROSTAT) 0.4 MG SL tablet Place 1 tablet (0.4 mg total) under the tongue every 5 (five) minutes as needed for chest pain. 04/29/14   Rogelia Mire, NP  omeprazole (PRILOSEC) 20 MG capsule TAKE ONE CAPSULE BY MOUTH ONCE DAILY 04/27/15   [name]., MD  prednisoLONE acetate (PRED FORTE) 1 % ophthalmic suspension Place 1 drop into the left eye daily.    Historical Provider, MD    Patient Active Problem List   Diagnosis Date Noted  . Obesity 09/09/2014  . Bilateral leg weakness 09/09/2014  . Diastolic CHF, acute on chronic 05/13/2014  . Bilateral leg edema 04/15/2014  . Bradycardia 08/13/2013  . Edema 06/21/2012  . Fatigue 09/15/2011  . Diabetes mellitus 09/15/2011  . DYSPNEA 06/09/2009  . Hyperlipidemia 06/07/2009  . HYPERTENSION, BENIGN 06/07/2009  . CAD, ARTERY BYPASS GRAFT 06/07/2009    Past Medical History  Diagnosis Date  . CAD (coronary artery disease)     a. s/p CABG;  b. 07/2012 Cath: 3VD including 80 dLCX (med Rx), 4/4 patent grafts.  . Diabetes mellitus, type 2   . Hyperlipidemia   . HTN (hypertension)   . Hypothyroidism   . Chronic diastolic CHF (congestive heart failure)     a. 07/2012 Echo: EF 55-60%, no rwma, Gr 1 DD, mild MR;  04/2014 Echo: EF  55-60%, no rwma, mild MR, mildly dil LA.  Marland Kitchen Lower extremity edema     a. 03/2014 amlodipine d/c'd.    Social History   Social History  . Marital Status: Married    Spouse Name: N/A  . Number of Children: N/A  . Years of Education: N/A   Occupational History  . Not on file.   Social History Main Topics  . Smoking status: Former Smoker -- 1.00 packs/day    Types: Cigarettes    Quit date: 12/23/1980  . Smokeless tobacco: Never Used  . Alcohol Use: No  . Drug Use: No  . Sexual Activity: No   Other Topics Concern  . Not on file   Social History Narrative   Retired.     No Known Allergies  Review  of Systems  Eyes: Negative.   Respiratory: Positive for shortness of breath.   Cardiovascular: Negative.   Gastrointestinal: Negative.   Musculoskeletal: Positive for myalgias.  Neurological: Negative.   Psychiatric/Behavioral: Negative.      There is no immunization history on file for this patient. Objective:  BP 172/64 mmHg  Pulse 54  Temp(Src) 97.8 F (36.6 C)  Resp 14  Wt 230 lb (104.327 kg)  SpO2 96%  Physical Exam  Constitutional: He is oriented to person, place, and time and well-developed, well-nourished, and in no distress.  HENT:  Head: Normocephalic and atraumatic.  Right Ear: External ear normal.  Left Ear: External ear normal.  Nose: Nose normal.  Eyes: Conjunctivae are normal. Pupils are equal, round, and reactive to light.  Neck: Normal range of motion. Neck supple.  Cardiovascular: Normal rate, regular rhythm, normal heart sounds and intact distal pulses.   No murmur heard. Pulmonary/Chest: Effort normal and breath sounds normal. No respiratory distress. He has no wheezes.  Abdominal: Soft. Bowel sounds are normal.  Musculoskeletal: He exhibits edema. He exhibits no tenderness.  Neurological: He is alert and oriented to person, place, and time. He has normal sensation, normal strength and intact cranial nerves.  Grossly nonfocal exam.  Skin: Skin is warm and dry.  Psychiatric: Mood, memory, affect and judgment normal.    Lab Results  Component Value Date   WBC 5.7 04/29/2014   HGB 12.7 04/29/2014   HCT 37.8 04/29/2014   PLT 184 04/29/2014   GLUCOSE 184* 05/06/2014   CHOL 141 02/15/2013   TRIG 109 02/15/2013   HDL 38* 02/15/2013   LDLCALC 81 02/15/2013   INR 1.0 04/29/2014    CMP     Component Value Date/Time   NA 142 05/06/2014 1430   K 3.8 05/06/2014 1430   CL 98 05/06/2014 1430   CO2 23 05/06/2014 1430   GLUCOSE 184* 05/06/2014 1430   BUN 27 05/06/2014 1430   CREATININE 1.13 05/06/2014 1430   CALCIUM 8.7 05/06/2014 1430   PROT 6.0  02/15/2013 0917   AST 23 02/15/2013 0917   ALT 20 02/15/2013 0917   ALKPHOS 45 02/15/2013 0917   BILITOT 0.4 02/15/2013 0917   GFRNONAA 59* 05/06/2014 1430   GFRAA 68 05/06/2014 1430    Assessment and Plan :  1. HYPERTENSION, BENIGN Elevated today. Will add Hydralazine and continue current medications also. - hydrALAZINE (APRESOLINE) 25 MG tablet; Take 1 tablet (25 mg total) by mouth 2 (two) times daily.  Dispense: 60 tablet; Refill: 12 Return to clinic 1 month.  2. Type 2 diabetes mellitus with cardiovascular complication [facility] Foot exam done today-monofilament normal today bilateral. No ulcers, no  lesions. Pulses are normal.  3. Obesity Work on habits.  4. Diastolic CHF, acute on chronic  5. New daily persistent headache Will refer to Dr. Melrose Nakayama for further plan of care. - Ambulatory referral to Neurology  I have done the exam and reviewed the above chart and it is accurate to the best of my knowledge.    Miguel Aschoff MD [facility] 07/28/2015 11:50 AM

## 2015-08-10 ENCOUNTER — Telehealth: Payer: Self-pay | Admitting: Family Medicine

## 2015-08-10 NOTE — Telephone Encounter (Signed)
Pt's wife Izora Gala called and rescheduled pt's appt and wanted a nurse to return his call. Thanks TNP

## 2015-08-11 NOTE — Telephone Encounter (Signed)
Left message to call back  

## 2015-08-12 MED ORDER — TORSEMIDE 20 MG PO TABS: 20.0000 mg | ORAL_TABLET | Freq: Every day | ORAL | Status: DC | PRN

## 2015-08-12 NOTE — Telephone Encounter (Signed)
Pt advised-aa 

## 2015-08-12 NOTE — Telephone Encounter (Signed)
Can try torsemide 20 mg every morning when necessary. No idea if this will cause cramps or not for this patient. Would use KCl 10 mEq, one by mouth daily each time he takes Torosemide

## 2015-08-12 NOTE — Telephone Encounter (Signed)
Patient's wife reports that patient has had really bad leg cramps while on Lasix. She reports that he stopped taking Lasix about 3 days ago and his legs are really swollen. She is wanting to know if there is another fluid pill that you recommend other than Lasix. Patient uses [place] Thanks!

## 2015-08-15 ENCOUNTER — Other Ambulatory Visit: Payer: Self-pay | Admitting: Family Medicine

## 2015-08-18 ENCOUNTER — Telehealth: Payer: Self-pay

## 2015-08-18 NOTE — Telephone Encounter (Signed)
LMTCB-we received forms for diabetic shoes, need to know if this is something patient requested and get some information. Form on desk [id number]

## 2015-08-19 NOTE — Telephone Encounter (Signed)
Pt did request diabetic shoes, he has had them for years. Form filled out-aa

## 2015-08-19 NOTE — Telephone Encounter (Signed)
Pt id returning call.  FD#[phone]/[id number]

## 2015-08-25 ENCOUNTER — Ambulatory Visit: Payer: PPO | Admitting: Family Medicine

## 2015-08-26 ENCOUNTER — Telehealth: Payer: Self-pay

## 2015-08-26 NOTE — Telephone Encounter (Signed)
Left message for Joseph [name] at [facility] in regards to the form for this patient-aa

## 2015-08-26 NOTE — Telephone Encounter (Signed)
Spoke with [employer] and they did not receive the form filled out the other week so will re fax it-aa

## 2015-09-01 ENCOUNTER — Ambulatory Visit (INDEPENDENT_AMBULATORY_CARE_PROVIDER_SITE_OTHER): Payer: PPO | Admitting: Family Medicine

## 2015-09-01 ENCOUNTER — Telehealth: Payer: Self-pay | Admitting: Family Medicine

## 2015-09-01 VITALS — BP 158/76 | HR 66 | Temp 97.7°F | Resp 16 | Wt 232.0 lb

## 2015-09-01 DIAGNOSIS — Z23 Encounter for immunization: Secondary | ICD-10-CM

## 2015-09-01 DIAGNOSIS — I10 Essential (primary) hypertension: Secondary | ICD-10-CM | POA: Diagnosis not present

## 2015-09-01 NOTE — Progress Notes (Addendum)
Patient ID: Joseph [name], male   DOB: 10-05-1925, 81 y.o.   MRN: [id number]   Neuropathy follow up Patient wears diabetic shoes that help him with his symptoms. Today on the exam he does not have ulcers, pulses are intact. Patient states he has history of ulceration on his feet. He would like to get a new set of diabetic shoes, he has been wearing these and they have helped a lot. His diabetes are stable. Follow  As scheduled.

## 2015-09-01 NOTE — Progress Notes (Signed)
Patient ID: Joseph [name], male   DOB: 11-Jan-1925, 81 y.o.   MRN: [id number]   HAGEN BOHORQUEZ  MRN: [id number] DOB: 1924/12/19  Subjective:  HPI   1. Essential hypertension Patient is an 81 year old male who presents for follow up of his hypertension.  He was last seen on 07/28/15.  During that visit it is noted that the patient was started on Hydralazine.  When discussing this with the patient he had no knowledge of this drug or of being started on anything new.  He did not have it listed on his home med list.  He did however call to the office on 08/10/15 to say he was having leg cramps on Lasix and this was changed to Torsemide.  He did have those changes on his list and was very aware of that information.  The patient also did not have Norvasc listed on his medicine list as we did ours and stated he did not know what that was.    Patient Active Problem List   Diagnosis Date Noted  . Back pain, chronic 07/28/2015  . Benign fibroma of prostate 07/28/2015  . Acid reflux 07/28/2015  . Adult hypothyroidism 07/28/2015  . Eunuchoidism 07/28/2015  . Neuropathy ([place]) 07/28/2015  . Arthritis, degenerative 07/28/2015  . Psoriasis 07/28/2015  . Obesity 09/09/2014  . Bilateral leg weakness 09/09/2014  . Diastolic CHF, acute on chronic ([place]) 05/13/2014  . Bilateral leg edema 04/15/2014  . Bradycardia 08/13/2013  . Edema 06/21/2012  . Fatigue 09/15/2011  . Diabetes mellitus with peripheral vascular disease ([place]) 09/15/2011  . DYSPNEA 06/09/2009  . Hyperlipidemia 06/07/2009  . HYPERTENSION, BENIGN 06/07/2009  . CAD, ARTERY BYPASS GRAFT 06/07/2009    Past Medical History  Diagnosis Date  . CAD (coronary artery disease)     a. s/p CABG;  b. 07/2012 Cath: 3VD including 80 dLCX (med Rx), 4/4 patent grafts.  . Diabetes mellitus, type 2   . Hyperlipidemia   . HTN (hypertension)   . Hypothyroidism   . Chronic diastolic CHF (congestive heart failure)     a. 07/2012 Echo: EF 55-60%, no rwma,  Gr 1 DD, mild MR;  04/2014 Echo: EF 55-60%, no rwma, mild MR, mildly dil LA.  Marland Kitchen Lower extremity edema     a. 03/2014 amlodipine d/c'd.    Social History   Social History  . Marital Status: Married    Spouse Name: N/A  . Number of Children: N/A  . Years of Education: N/A   Occupational History  . Not on file.   Social History Main Topics  . Smoking status: Former Smoker -- 1.00 packs/day    Types: Cigarettes    Quit date: 12/23/1980  . Smokeless tobacco: Never Used  . Alcohol Use: No  . Drug Use: No  . Sexual Activity: No   Other Topics Concern  . Not on file   Social History Narrative   Retired.     Outpatient Prescriptions Prior to Visit  Medication Sig Dispense Refill  . Ascorbic Acid (VITAMIN C) 100 MG tablet Take by mouth.    Marland Kitchen atorvastatin (LIPITOR) 10 MG tablet Take 10 mg by mouth daily.    . cholecalciferol (VITAMIN D) 1000 UNITS tablet Take 2,000 Units by mouth daily.    Marland Kitchen doxazosin (CARDURA) 4 MG tablet TAKE ONE TABLET BY MOUTH TWICE DAILY 60 tablet 3  . insulin aspart (NOVOLOG) 100 UNIT/ML injection Inject into the skin 3 (three) times daily before meals. 26-16-28    .  insulin glargine (LANTUS) 100 UNIT/ML injection Inject 70 Units into the skin at bedtime.     . isosorbide mononitrate (IMDUR) 30 MG 24 hr tablet Take 1 tablet (30 mg total) by mouth daily. 90 tablet 3  . levothyroxine (SYNTHROID, LEVOTHROID) 50 MCG tablet Take 50 mcg by mouth daily before breakfast.    . lisinopril (PRINIVIL,ZESTRIL) 40 MG tablet TAKE ONE TABLET BY MOUTH ONCE DAILY 90 tablet 3  . metolazone (ZAROXOLYN) 2.5 MG tablet Take by mouth.    . nitroGLYCERIN (NITROSTAT) 0.4 MG SL tablet Place 1 tablet (0.4 mg total) under the tongue every 5 (five) minutes as needed for chest pain. 25 tablet 3  . omeprazole (PRILOSEC) 20 MG capsule TAKE ONE CAPSULE BY MOUTH ONCE DAILY 30 capsule 5  . potassium chloride (KLOR-CON 10) 10 MEQ tablet Take by mouth.    . torsemide (DEMADEX) 20 MG tablet Take 1  tablet (20 mg total) by mouth daily as needed. 30 tablet 12  . amLODipine (NORVASC) 10 MG tablet Take 10 mg by mouth daily.    . ciclopirox (PENLAC) 8 % solution     . hydrALAZINE (APRESOLINE) 25 MG tablet Take 1 tablet (25 mg total) by mouth 2 (two) times daily. (Patient not taking: Reported on 09/01/2015) 60 tablet 12  . prednisoLONE acetate (PRED FORTE) 1 % ophthalmic suspension Place 1 drop into the left eye daily.     No facility-administered medications prior to visit.    No Known Allergies  Review of Systems  Constitutional: Negative for fever, chills and malaise/fatigue.  Eyes: Negative.   Respiratory: Negative for cough, shortness of breath and wheezing.   Cardiovascular: Positive for leg swelling (Chronic, improved recently). Negative for chest pain, palpitations and orthopnea.  Gastrointestinal: Negative.   Skin: Negative.   Neurological: Positive for headaches (Patient states he has headache every 2-3 days). Negative for dizziness and weakness.  Psychiatric/Behavioral: Negative.    Objective:  BP 158/76 mmHg  Pulse 66  Temp(Src) 97.7 F (36.5 C) (Oral)  Resp 16  Wt 232 lb (105.235 kg)  Physical Exam  Constitutional: He is oriented to person, place, and time and well-developed, well-nourished, and in no distress.  HENT:  Head: Normocephalic and atraumatic.  Right Ear: External ear normal.  Left Ear: External ear normal.  Nose: Nose normal.  Eyes: Conjunctivae are normal.  Neck: Neck supple.  Cardiovascular: Normal rate, regular rhythm and normal heart sounds.   Pulmonary/Chest: Effort normal and breath sounds normal.  Abdominal: Soft.  Neurological: He is alert and oriented to person, place, and time.  Skin: Skin is warm and dry.  Psychiatric: Mood, memory, affect and judgment normal.    Assessment and Plan :  Essential hypertension 1. Essential hypertension Patient to get new prescription and start hydralazine today. Return to clinic 1 month. Blood  pressure is improving somewhat. 2. Need for influenza vaccination  - Flu vaccine HIGH DOSE PF 3. CAD/ASCVD All risk factors treated I have done the exam and reviewed the above chart and it is accurate to the best of my knowledge.  Miguel Aschoff MD [facility] 09/01/2015 11:47 AM

## 2015-09-04 ENCOUNTER — Encounter: Payer: Self-pay | Admitting: Family Medicine

## 2015-09-07 ENCOUNTER — Ambulatory Visit: Payer: PPO | Admitting: Cardiovascular Disease

## 2015-09-28 ENCOUNTER — Other Ambulatory Visit: Payer: Self-pay | Admitting: Neurology

## 2015-09-28 DIAGNOSIS — R51 Headache: Principal | ICD-10-CM

## 2015-09-28 DIAGNOSIS — R519 Headache, unspecified: Secondary | ICD-10-CM

## 2015-09-29 ENCOUNTER — Encounter: Payer: Self-pay | Admitting: Family Medicine

## 2015-10-06 ENCOUNTER — Ambulatory Visit
Admission: RE | Admit: 2015-10-06 | Discharge: 2015-10-06 | Disposition: A | Discharge status: Home / Self Care | Payer: PPO | Source: Ambulatory Visit | Attending: Neurology | Admitting: Neurology

## 2015-10-06 DIAGNOSIS — R51 Headache: Secondary | ICD-10-CM | POA: Insufficient documentation

## 2015-10-06 DIAGNOSIS — R519 Headache, unspecified: Secondary | ICD-10-CM

## 2015-10-26 ENCOUNTER — Ambulatory Visit (INDEPENDENT_AMBULATORY_CARE_PROVIDER_SITE_OTHER): Payer: PPO | Admitting: Cardiovascular Disease

## 2015-10-26 ENCOUNTER — Encounter: Payer: Self-pay | Admitting: Cardiovascular Disease

## 2015-10-26 VITALS — BP 140/64 | HR 63 | Ht 67.0 in | Wt 230.8 lb

## 2015-10-26 DIAGNOSIS — I25810 Atherosclerosis of coronary artery bypass graft(s) without angina pectoris: Secondary | ICD-10-CM | POA: Diagnosis not present

## 2015-10-26 DIAGNOSIS — R29898 Other symptoms and signs involving the musculoskeletal system: Secondary | ICD-10-CM | POA: Diagnosis not present

## 2015-10-26 DIAGNOSIS — I5033 Acute on chronic diastolic (congestive) heart failure: Secondary | ICD-10-CM | POA: Diagnosis not present

## 2015-10-26 DIAGNOSIS — I10 Essential (primary) hypertension: Secondary | ICD-10-CM

## 2015-10-26 DIAGNOSIS — E669 Obesity, unspecified: Secondary | ICD-10-CM

## 2015-10-26 DIAGNOSIS — E1151 Type 2 diabetes mellitus with diabetic peripheral angiopathy without gangrene: Secondary | ICD-10-CM

## 2015-10-26 DIAGNOSIS — R600 Localized edema: Secondary | ICD-10-CM

## 2015-10-26 DIAGNOSIS — E785 Hyperlipidemia, unspecified: Secondary | ICD-10-CM

## 2015-10-26 MED ORDER — ATORVASTATIN CALCIUM 40 MG PO TABS: 40.0000 mg | ORAL_TABLET | Freq: Every day | ORAL | Status: DC

## 2015-10-26 NOTE — Assessment & Plan Note (Signed)
Recommended he increase the Lipitor up to 40 mg daily, goal LDL less than 70

## 2015-10-26 NOTE — Assessment & Plan Note (Signed)
Blood pressure is well controlled on today's visit. No changes made to the medications. 

## 2015-10-26 NOTE — Patient Instructions (Addendum)
You are doing well.  Please increase the atorvastatin up to 40 mg daily (whole pill)  Please call us if you have new issues that need to be addressed before your next appt.  Your physician wants you to follow-up in: 6 months.  You will receive a reminder letter in the mail two months in advance. If you don't receive a letter, please call our office to schedule the follow-up appointment.

## 2015-10-26 NOTE — Assessment & Plan Note (Signed)
Appears relatively euvolemic on his current regimen, no changes made, normal BMP

## 2015-10-26 NOTE — Progress Notes (Signed)
Patient ID: Joseph [name], male    DOB: [birth year], 81 y.o.   MRN: [id number]  HPI Comments: Joseph [name] is a 81 year old gentleman with a hx of CAD, CABG in 1994 per the wife, PAD with reported stenosis in the past requiring intervention (details unavailable),  followed by Dr.  Lucky Cowboy, obesity, hyperlipidemia, arthritis of the knees, who previously presented with increasing fatigue, SOB, leg weakness,  Prior episodes of lower extremity edema, now presenting for routine followup of his coronary artery disease and history of acute on chronic diastolic CHF.  He is a patient of Dr. Rosanna Randy.  In follow-up today, he reports that he is doing relatively well No regular exercise program,   continues to take diuretic, previously on Lasix, now on metolazone daily Recent lab work shows normal renal function and electrolytes  Taking Lipitor 20 g daily, LDL in the 90s  Denies any shortness of breath or angina on exertion   EKG on today's visit shows normal sinus rhythm , intraventricular conduction delay, unable to exclude old anterior MI, left anterior fascicular block, APCs  Other past medical history Ultrasound of the legs showing patent right SFA stent, left side CFA and popliteal artery with good flow, waveform deterioration around the ankle on the left  Last Cardiac catheterization was [facility] 08/16/2012 Catheterization showed severe three-vessel coronary artery disease with patent grafts x4. There was 80% disease of a small to moderate sized distal left circumflex. Ejection fraction 45-50%. No significant aortic valve stenosis or mitral valve regurgitation. The findings were discussed with interventional cardiology and medical management was recommended for the distal left circumflex disease.   previous Echocardiogram did not show elevated right-sided pressures and ejection fraction was normal.  Previous episode of tachycardia in June 2013 that resolved without intervention  Total  cholesterol 143, LDL 82   No Known Allergies  Outpatient Encounter Prescriptions as of 10/26/2015  Medication Sig  . amLODipine (NORVASC) 10 MG tablet Take 10 mg by mouth daily.  . Ascorbic Acid (VITAMIN C) 100 MG tablet Take by mouth.  Marland Kitchen atorvastatin (LIPITOR) 40 MG tablet Take 1 tablet (40 mg total) by mouth daily.  . cholecalciferol (VITAMIN D) 1000 UNITS tablet Take 2,000 Units by mouth daily.  . ciclopirox (PENLAC) 8 % solution   . doxazosin (CARDURA) 4 MG tablet TAKE ONE TABLET BY MOUTH TWICE DAILY  . hydrALAZINE (APRESOLINE) 25 MG tablet Take 1 tablet (25 mg total) by mouth 2 (two) times daily.  . insulin aspart (NOVOLOG) 100 UNIT/ML injection Inject into the skin 3 (three) times daily before meals. 26-16-28  . insulin glargine (LANTUS) 100 UNIT/ML injection Inject 70 Units into the skin at bedtime.   . isosorbide mononitrate (IMDUR) 30 MG 24 hr tablet Take 1 tablet (30 mg total) by mouth daily.  Marland Kitchen levothyroxine (SYNTHROID, LEVOTHROID) 50 MCG tablet Take 50 mcg by mouth daily before breakfast.  . lisinopril (PRINIVIL,ZESTRIL) 40 MG tablet TAKE ONE TABLET BY MOUTH ONCE DAILY  . metolazone (ZAROXOLYN) 2.5 MG tablet Take by mouth.  . nitroGLYCERIN (NITROSTAT) 0.4 MG SL tablet Place 1 tablet (0.4 mg total) under the tongue every 5 (five) minutes as needed for chest pain.  Marland Kitchen omeprazole (PRILOSEC) 20 MG capsule TAKE ONE CAPSULE BY MOUTH ONCE DAILY  . potassium chloride (KLOR-CON 10) 10 MEQ tablet Take by mouth.  . prednisoLONE acetate (PRED FORTE) 1 % ophthalmic suspension Place 1 drop into the left eye daily.  Marland Kitchen torsemide (DEMADEX) 20 MG tablet Take 1 tablet (  20 mg total) by mouth daily as needed.  . [DISCONTINUED] atorvastatin (LIPITOR) 40 MG tablet Take 20 mg by mouth daily.   . [DISCONTINUED] atorvastatin (LIPITOR) 10 MG tablet Take 10 mg by mouth daily.   No facility-administered encounter medications on file as of 10/26/2015.    Past Medical History  Diagnosis Date  . CAD  (coronary artery disease)     a. s/p CABG;  b. 07/2012 Cath: 3VD including 80 dLCX (med Rx), 4/4 patent grafts.  . Diabetes mellitus, type 2 ([place])   . Hyperlipidemia   . HTN (hypertension)   . Hypothyroidism   . Chronic diastolic CHF (congestive heart failure) ([place])     a. 07/2012 Echo: EF 55-60%, no rwma, Gr 1 DD, mild MR;  04/2014 Echo: EF 55-60%, no rwma, mild MR, mildly dil LA.  Marland Kitchen Lower extremity edema     a. 03/2014 amlodipine d/c'd.    Past Surgical History  Procedure Laterality Date  . Prostate biopsy    . Hemorrhoid surgery    . Appendectomy    . Toe surgery      ingrown toe nail  . Coronary artery bypass graft  1994  . Cardiac catheterization  07/2012    [facility]/no stents  . Corneal transplant      Social History  reports that he quit smoking about 34 years ago. His smoking use included Cigarettes. He smoked 1.00 pack per day. He has never used smokeless tobacco. He reports that he does not drink alcohol or use illicit drugs.  Family History family history includes Heart attack in his father; Heart failure in his maternal grandfather, maternal uncle, and mother.       Review of Systems  Respiratory: Negative.   Cardiovascular: Negative.   Gastrointestinal: Negative.   Musculoskeletal: Positive for gait problem.  Neurological: Positive for weakness.  Hematological: Negative.   Psychiatric/Behavioral: Negative.   All other systems reviewed and are negative.   BP 140/64 mmHg  Pulse 63  Ht 5\' 7"  (1.702 m)  Wt 230 lb 12 oz (104.668 kg)  BMI 36.13 kg/m2  Physical Exam  Constitutional: He is oriented to person, place, and time. He appears well-developed and well-nourished.   obese  HENT:  Head: Normocephalic.  Nose: Nose normal.  Mouth/Throat: Oropharynx is clear and moist.  Eyes: Conjunctivae are normal. Pupils are equal, round, and reactive to light.  Neck: Normal range of motion. Neck supple. No JVD present.  Cardiovascular: Normal rate, regular rhythm, S1  normal, S2 normal, normal heart sounds and intact distal pulses.  Exam reveals no gallop, no friction rub and no decreased pulses.   No murmur heard. Pulses:      Carotid pulses are 2+ on the right side, and 2+ on the left side.      Radial pulses are 2+ on the right side, and 2+ on the left side.  Trace+ pitting edema around the ankles bilaterally  Pulmonary/Chest: Effort normal and breath sounds normal. No respiratory distress. He has no wheezes. He has no rales. He exhibits no tenderness.  Abdominal: Soft. Bowel sounds are normal. He exhibits no distension. There is no tenderness.  Musculoskeletal: Normal range of motion. He exhibits no tenderness.  Lymphadenopathy:    He has no cervical adenopathy.  Neurological: He is alert and oriented to person, place, and time. Coordination normal.  Skin: Skin is warm and dry. No rash noted. No erythema.  Psychiatric: He has a normal mood and affect. His behavior is normal. Judgment and  thought content normal.      Assessment and Plan   Nursing note and vitals reviewed.

## 2015-10-26 NOTE — Assessment & Plan Note (Signed)
Recommended a regular exercise program.

## 2015-10-26 NOTE — Assessment & Plan Note (Signed)
Recommended a low carbohydrate diet for weight loss

## 2015-10-26 NOTE — Assessment & Plan Note (Signed)
We have encouraged continued exercise, careful diet management in an effort to lose weight. 

## 2015-10-26 NOTE — Assessment & Plan Note (Signed)
Currently with no symptoms of angina. No further workup at this time. Continue current medication regimen. 

## 2015-10-26 NOTE — Assessment & Plan Note (Signed)
Leg edema has resolved on his diuretic regimen. Stable lab work No medication changes made

## 2015-11-04 ENCOUNTER — Other Ambulatory Visit: Payer: Self-pay | Admitting: Family Medicine

## 2015-11-13 ENCOUNTER — Other Ambulatory Visit: Payer: Self-pay | Admitting: Family Medicine

## 2015-11-13 DIAGNOSIS — I5033 Acute on chronic diastolic (congestive) heart failure: Secondary | ICD-10-CM

## 2015-11-24 ENCOUNTER — Ambulatory Visit (INDEPENDENT_AMBULATORY_CARE_PROVIDER_SITE_OTHER): Payer: PPO | Admitting: Family Medicine

## 2015-11-24 ENCOUNTER — Encounter: Payer: Self-pay | Admitting: Family Medicine

## 2015-11-24 VITALS — BP 128/56 | HR 60 | Temp 97.9°F | Resp 12 | Wt 232.0 lb

## 2015-11-24 DIAGNOSIS — E1151 Type 2 diabetes mellitus with diabetic peripheral angiopathy without gangrene: Secondary | ICD-10-CM

## 2015-11-24 DIAGNOSIS — E785 Hyperlipidemia, unspecified: Secondary | ICD-10-CM

## 2015-11-24 DIAGNOSIS — R600 Localized edema: Secondary | ICD-10-CM

## 2015-11-24 DIAGNOSIS — L408 Other psoriasis: Secondary | ICD-10-CM | POA: Diagnosis not present

## 2015-11-24 DIAGNOSIS — I10 Essential (primary) hypertension: Secondary | ICD-10-CM | POA: Diagnosis not present

## 2015-11-24 DIAGNOSIS — E669 Obesity, unspecified: Secondary | ICD-10-CM

## 2015-11-24 NOTE — Progress Notes (Signed)
Patient ID: Joseph [name], male   DOB: 11-27-1924, 82 y.o.   MRN: [id number]    Subjective:  HPI  Patient is here for follow up. He was last seen in October and we started patient on Hydralazine 25 mg twice daily. He saw Dr. Rockey Situ in December and it was noted that B/P was good. His Lipitor was increased to 40 mg for better control.  Patient checks his B/P occasionally and states top number is around 160-130/50s. BP Readings from Last 3 Encounters:  11/24/15 128/56  10/26/15 140/64  09/01/15 158/76   He does have a concern today-he has had severe muscle cramps since taking Torsemide. He tries to stop taking that medication for 2 to 3 days and muscle cramps will stop. Cramps are severe.He says he must at times take torosemide for swelling. No recent CHF issues or angina.  Prior to Admission medications   Medication Sig Start Date End Date Taking? Authorizing Provider  amLODipine (NORVASC) 10 MG tablet Take 10 mg by mouth daily.   Yes Historical Provider, MD  Ascorbic Acid (VITAMIN C) 100 MG tablet Take by mouth.   Yes Historical Provider, MD  atorvastatin (LIPITOR) 40 MG tablet Take 1 tablet (40 mg total) by mouth daily. 10/26/15  Yes Minna Merritts, MD  cholecalciferol (VITAMIN D) 1000 UNITS tablet Take 2,000 Units by mouth daily.   Yes Historical Provider, MD  ciclopirox (PENLAC) 8 % solution    Yes Historical Provider, MD  doxazosin (CARDURA) 4 MG tablet TAKE ONE TABLET BY MOUTH TWICE DAILY 01/12/15  Yes Minna Merritts, MD  hydrALAZINE (APRESOLINE) 25 MG tablet Take 1 tablet (25 mg total) by mouth 2 (two) times daily. 07/28/15  Yes Richard Maceo Pro., MD  insulin aspart (NOVOLOG) 100 UNIT/ML injection Inject into the skin 3 (three) times daily before meals. 26-16-28   Yes Historical Provider, MD  insulin glargine (LANTUS) 100 UNIT/ML injection Inject 70 Units into the skin at bedtime.    Yes Historical Provider, MD  isosorbide mononitrate (IMDUR) 30 MG 24 hr tablet Take 1 tablet  (30 mg total) by mouth daily. 05/06/14  Yes Rogelia Mire, NP  levothyroxine (SYNTHROID, LEVOTHROID) 50 MCG tablet Take 50 mcg by mouth daily before breakfast.   Yes Historical Provider, MD  lisinopril (PRINIVIL,ZESTRIL) 40 MG tablet TAKE ONE TABLET BY MOUTH ONCE DAILY 08/17/15  Yes [name]., MD  Magnesium 250 MG TABS Take by mouth.   Yes Historical Provider, MD  metolazone (ZAROXOLYN) 2.5 MG tablet Take by mouth. 11/03/14  Yes Historical Provider, MD  nitroGLYCERIN (NITROSTAT) 0.4 MG SL tablet Place 1 tablet (0.4 mg total) under the tongue every 5 (five) minutes as needed for chest pain. 04/29/14  Yes Rogelia Mire, NP  omeprazole (PRILOSEC) 20 MG capsule TAKE ONE CAPSULE BY MOUTH ONCE DAILY 11/04/15  Yes [name]., MD  potassium chloride (K-DUR) 10 MEQ tablet TAKE TWO TABLETS BY MOUTH ONCE DAILY 11/18/15  Yes [name]., MD  prednisoLONE acetate (PRED FORTE) 1 % ophthalmic suspension Place 1 drop into the left eye daily.   Yes Historical Provider, MD  torsemide (DEMADEX) 20 MG tablet Take 1 tablet (20 mg total) by mouth daily as needed. 08/12/15  Yes Richard Maceo Pro., MD    Patient Active Problem List   Diagnosis Date Noted  . Back pain, chronic 07/28/2015  . Benign fibroma of prostate 07/28/2015  . Acid reflux 07/28/2015  . Adult hypothyroidism 07/28/2015  .  Eunuchoidism 07/28/2015  . Neuropathy ([place]) 07/28/2015  . Arthritis, degenerative 07/28/2015  . Psoriasis 07/28/2015  . Obesity 09/09/2014  . Bilateral leg weakness 09/09/2014  . Diastolic CHF, acute on chronic ([place]) 05/13/2014  . Bilateral leg edema 04/15/2014  . Bradycardia 08/13/2013  . Edema 06/21/2012  . Fatigue 09/15/2011  . Diabetes mellitus with peripheral vascular disease ([place]) 09/15/2011  . DYSPNEA 06/09/2009  . Hyperlipidemia 06/07/2009  . HYPERTENSION, BENIGN 06/07/2009  . CAD, ARTERY BYPASS GRAFT 06/07/2009    Past Medical History  Diagnosis Date  . CAD  (coronary artery disease)     a. s/p CABG;  b. 07/2012 Cath: 3VD including 80 dLCX (med Rx), 4/4 patent grafts.  . Diabetes mellitus, type 2 ([place])   . Hyperlipidemia   . HTN (hypertension)   . Hypothyroidism   . Chronic diastolic CHF (congestive heart failure) ([place])     a. 07/2012 Echo: EF 55-60%, no rwma, Gr 1 DD, mild MR;  04/2014 Echo: EF 55-60%, no rwma, mild MR, mildly dil LA.  Marland Kitchen Lower extremity edema     a. 03/2014 amlodipine d/c'd.    Social History   Social History  . Marital Status: Married    Spouse Name: N/A  . Number of Children: N/A  . Years of Education: N/A   Occupational History  . Not on file.   Social History Main Topics  . Smoking status: Former Smoker -- 1.00 packs/day    Types: Cigarettes    Quit date: 12/23/1980  . Smokeless tobacco: Never Used  . Alcohol Use: No  . Drug Use: No  . Sexual Activity: No   Other Topics Concern  . Not on file   Social History Narrative   Retired.     No Known Allergies  Review of Systems  Constitutional: Negative.   Eyes: Negative.   Respiratory: Negative.   Cardiovascular: Negative.   Gastrointestinal: Negative.   Musculoskeletal: Positive for myalgias and joint pain.  Skin: Negative.   Neurological: Negative.   Psychiatric/Behavioral: Negative.     Immunization History  Administered Date(s) Administered  . Influenza, High Dose Seasonal PF 09/01/2015   Objective:  BP 128/56 mmHg  Pulse 60  Temp(Src) 97.9 F (36.6 C)  Resp 12  Wt 232 lb (105.235 kg)  Physical Exam  Constitutional: He is oriented to person, place, and time and well-developed, well-nourished, and in no distress.  HENT:  Head: Normocephalic and atraumatic.  Right Ear: External ear normal.  Left Ear: External ear normal.  Nose: Nose normal.  Eyes: Conjunctivae are normal. Pupils are equal, round, and reactive to light.  Neck: Neck supple.  Cardiovascular: Normal rate, regular rhythm, normal heart sounds and intact distal pulses.     No murmur heard. Pulmonary/Chest: Effort normal and breath sounds normal. No respiratory distress. He has no wheezes.  Abdominal: Soft.  Musculoskeletal: He exhibits no edema or tenderness.  Neurological: He is alert and oriented to person, place, and time.  Skin: Skin is warm and dry.  Psychiatric: Mood, memory, affect and judgment normal.    Lab Results  Component Value Date   WBC 7.9 07/06/2015   HGB 13.5 07/06/2015   HCT 40* 07/06/2015   PLT 176 07/06/2015   GLUCOSE 184* 05/06/2014   CHOL 148 07/06/2015   TRIG 191* 07/06/2015   HDL 36 07/06/2015   LDLCALC 91 07/06/2015   INR 1.0 04/29/2014   HGBA1C 7.1* 07/06/2015    CMP     Component Value Date/Time   NA 142  07/06/2015   K 4.4 07/06/2015   CL 98 05/06/2014 1430   CO2 23 05/06/2014 1430   GLUCOSE 184* 05/06/2014 1430   BUN 16 07/06/2015   CREATININE 1.0 07/06/2015   CREATININE 1.13 05/06/2014 1430   CALCIUM 8.7 05/06/2014 1430   PROT 6.0 02/15/2013 0917   ALBUMIN 3.6 02/15/2013 0917   AST 23 02/15/2013 0917   ALT 20 02/15/2013 0917   ALKPHOS 45 02/15/2013 0917   BILITOT 0.4 02/15/2013 0917   GFRNONAA 59* 05/06/2014 1430   GFRAA 68 05/06/2014 1430    Assessment and Plan :  1. HYPERTENSION, BENIGN Stable. Continue current medication.  2. Diabetes mellitus with peripheral vascular disease ([place]) [facility]  3. Bilateral leg edema Stable today. Dicussed with patient that he can stay off Torsemide if swelling in his legs is controlled. Patient discussed this issue with Dr. Rockey Situ in December and he was advised to take medication still. Advised patient to take Magnesium and Potassium twice daily instead of 1 tablets daily.   4. Hyperlipidemia Dr. Rockey Situ increased Lipitor to 40 mg recently. Follow as needed.  5. Obesity 6.CAD All risk factors treated. 7.Myalgias Secondary to diuretic--pt on Magnesium and K supplementation. Advised pt to try to avoid diuretics if possible.  I have done the exam and  reviewed the above chart and it is accurate to the best of my knowledge.  Patient was seen and examined by Dr. Eulas Post and note was scribed by Theressa Millard, RMA.    Miguel Aschoff MD [facility] 11/24/2015 11:38 AM

## 2015-12-02 ENCOUNTER — Other Ambulatory Visit: Payer: Self-pay | Admitting: Cardiovascular Disease

## 2015-12-09 DIAGNOSIS — R040 Epistaxis: Secondary | ICD-10-CM | POA: Diagnosis not present

## 2015-12-09 DIAGNOSIS — H6063 Unspecified chronic otitis externa, bilateral: Secondary | ICD-10-CM | POA: Diagnosis not present

## 2016-01-15 ENCOUNTER — Other Ambulatory Visit: Payer: Self-pay | Admitting: Family Medicine

## 2016-02-16 DIAGNOSIS — B351 Tinea unguium: Secondary | ICD-10-CM | POA: Diagnosis not present

## 2016-02-16 DIAGNOSIS — M79671 Pain in right foot: Secondary | ICD-10-CM | POA: Diagnosis not present

## 2016-02-16 DIAGNOSIS — Z794 Long term (current) use of insulin: Secondary | ICD-10-CM | POA: Diagnosis not present

## 2016-02-16 DIAGNOSIS — E119 Type 2 diabetes mellitus without complications: Secondary | ICD-10-CM | POA: Diagnosis not present

## 2016-02-16 DIAGNOSIS — M79672 Pain in left foot: Secondary | ICD-10-CM | POA: Diagnosis not present

## 2016-03-17 ENCOUNTER — Encounter: Payer: Self-pay | Admitting: Physician Assistant

## 2016-03-17 ENCOUNTER — Telehealth: Payer: Self-pay | Admitting: Cardiovascular Disease

## 2016-03-17 ENCOUNTER — Ambulatory Visit (INDEPENDENT_AMBULATORY_CARE_PROVIDER_SITE_OTHER): Payer: PPO | Admitting: Physician Assistant

## 2016-03-17 VITALS — BP 120/50 | HR 86 | Ht 67.0 in | Wt 233.4 lb

## 2016-03-17 DIAGNOSIS — Z01818 Encounter for other preprocedural examination: Secondary | ICD-10-CM | POA: Diagnosis not present

## 2016-03-17 DIAGNOSIS — I10 Essential (primary) hypertension: Secondary | ICD-10-CM

## 2016-03-17 DIAGNOSIS — I5032 Chronic diastolic (congestive) heart failure: Secondary | ICD-10-CM

## 2016-03-17 DIAGNOSIS — R0602 Shortness of breath: Secondary | ICD-10-CM | POA: Diagnosis not present

## 2016-03-17 DIAGNOSIS — I2510 Atherosclerotic heart disease of native coronary artery without angina pectoris: Secondary | ICD-10-CM | POA: Diagnosis not present

## 2016-03-17 DIAGNOSIS — E785 Hyperlipidemia, unspecified: Secondary | ICD-10-CM

## 2016-03-17 MED ORDER — ASPIRIN EC 81 MG PO TBEC: 81.0000 mg | DELAYED_RELEASE_TABLET | Freq: Every day | ORAL | Status: DC

## 2016-03-17 NOTE — Patient Instructions (Addendum)
Medication Instructions:  Please increase your torsemide to 20 mg twice daily for 3 days,  Then go back to your regular dose of 20 mg once daily  Labwork: BMET, CBC, PT/INR  Testing/Procedures: Chest x-ray  Follow-Up: 3-4 weeks  Date & time: __________________  If you need a refill on your cardiac medications before your next appointment, please call your pharmacy.   [facility] Cardiac Cath Instructions   You are scheduled for a Cardiac Cath on:___Thursday, May 11_____  Please arrive at 7:30 am on the day of your procedure  You will need to pre-register prior to the day of your procedure.  Enter through the [employer] at [facility].  Registration is the first desk on your right.  Please take the procedure order we have given you in order to be registered appropriately  Do not eat/drink anything after midnight  Someone will need to drive you home  It is recommended someone be with you for the first 24 hours after your procedure  Wear clothes that are easy to get on/off and wear slip on shoes if possible  Medications bring a current list of all medications with you  _X_ Do not take these medications the morning of your procedure -NO diabetes meds -NO torsemide  -NO lisinopril   Day of your procedure: Arrive at the [place] entrance.  Free valet service is available.  After entering the [facility] please check-in at the registration desk (1st desk on your right) to receive your armband. After receiving your armband someone will escort you to the cardiac cath/special procedures waiting area.  The usual length of stay after your procedure is about 2 to 3 hours.  This can vary.  If you have any questions, please call our office at [phone], or you may call the cardiac cath lab at [facility] directly at [phone]  Angiogram An angiogram, also called angiography, is a procedure used to look at the blood vessels. In this procedure, dye is injected through a long, thin tube  (catheter) into an artery. X-rays are then taken. The X-rays will show if there is a blockage or problem in a blood vessel.  LET [facility] CARE PROVIDER KNOW ABOUT: 8. Any allergies you have, including allergies to shellfish or contrast dye.  9. All medicines you are taking, including vitamins, herbs, eye drops, creams, and over-the-counter medicines.  10. Previous problems you or members of your family have had with the use of anesthetics.  11. Any blood disorders you have.  12. Previous surgeries you have had. 13. Any previous kidney problems or failure you have had. 14. Medical conditions you have.  15. Possibility of pregnancy, if this applies. RISKS AND COMPLICATIONS Generally, an angiogram is a safe procedure. However, as with any procedure, problems can occur. Possible problems include:  Injury to the blood vessels, including rupture or bleeding.  Infection or bruising at the catheter site.  Allergic reaction to the dye or contrast used.  Kidney damage from the dye or contrast used.  Blood clots that can lead to a stroke or heart attack. BEFORE THE PROCEDURE  Do not eat or drink after midnight on the night before the procedure, or as directed by your health care provider.   Ask your health care provider if you may drink enough water to take any needed medicines the morning of the procedure.  PROCEDURE  You may be given a medicine to help you relax (sedative) before and during the procedure. This medicine is given through an  IV access tube that is inserted into one of your veins.   The area where the catheter will be inserted will be washed and shaved. This is usually done in the groin but may be done in the fold of your arm (near your elbow) or in the wrist.  A medicine will be given to numb the area where the catheter will be inserted (local anesthetic).  The catheter will be inserted with a guide wire into an artery. The catheter is guided by using a type of  X-ray (fluoroscopy) to the blood vessel being examined.   Dye is then injected into the catheter, and X-rays are taken. The dye helps to show where any narrowing or blockages are located.  AFTER THE PROCEDURE   If the procedure is done through the leg, you will be kept in bed lying flat for several hours. You will be instructed to not bend or cross your legs.  The insertion site will be checked frequently.  The pulse in your feet or wrist will be checked frequently.  Additional blood tests, X-rays, and electrocardiography may be done.   You may need to stay in the hospital overnight for observation.    This information is not intended to replace advice given to you by your health care provider. Make sure you discuss any questions you have with your health care provider.   Document Released: 08/17/2005 Document Revised: 11/28/2014 Document Reviewed: 04/10/2013 Elsevier Interactive Patient Education 2016 [place] After Refer to this sheet in the next few weeks. These instructions provide you with information about caring for yourself after your procedure. Your health care provider may also give you more specific instructions. Your treatment has been planned according to current medical practices, but problems sometimes occur. Call your health care provider if you have any problems or questions after your procedure. WHAT TO EXPECT AFTER THE PROCEDURE After your procedure, it is typical to have the following: 16. Bruising at the catheter insertion site that usually fades within 1-2 weeks. 17. Blood collecting in the tissue (hematoma) that may be painful to the touch. It should usually decrease in size and tenderness within 1-2 weeks. HOME CARE INSTRUCTIONS  Take medicines only as directed by your health care provider.  You may shower 24-48 hours after the procedure or as directed by your health care provider. Remove the bandage (dressing) and gently wash the site  with plain soap and water. Pat the area dry with a clean towel. Do not rub the site, because this may cause bleeding.  Do not take baths, swim, or use a hot tub until your health care provider approves.  Check your insertion site every day for redness, swelling, or drainage.  Do not apply powder or lotion to the site.  Do not lift over 10 lb (4.5 kg) for 5 days after your procedure or as directed by your health care provider.  Ask your health care provider when it is okay to:  Return to work or school.  Resume usual physical activities or sports.  Resume sexual activity.  Do not drive home if you are discharged the same day as the procedure. Have someone else drive you.  You may drive 24 hours after the procedure unless otherwise instructed by your health care provider.  Do not operate machinery or power tools for 24 hours after the procedure or as directed by your health care provider.  If your procedure was done as an outpatient procedure, which means that you went  home the same day as your procedure, a responsible adult should be with you for the first 24 hours after you arrive home.  Keep all follow-up visits as directed by your health care provider. This is important. SEEK MEDICAL CARE IF:  You have a fever.  You have chills.  You have increased bleeding from the catheter insertion site. Hold pressure on the site. SEEK IMMEDIATE MEDICAL CARE IF:  You have unusual pain at the catheter insertion site.  You have redness, warmth, or swelling at the catheter insertion site.  You have drainage (other than a small amount of blood on the dressing) from the catheter insertion site.  The catheter insertion site is bleeding, and the bleeding does not stop after 30 minutes of holding steady pressure on the site.  The area near or just beyond the catheter insertion site becomes pale, cool, tingly, or numb.   This information is not intended to replace advice given to you by your  health care provider. Make sure you discuss any questions you have with your health care provider.   Document Released: 05/26/2005 Document Revised: 11/28/2014 Document Reviewed: 04/10/2013 Elsevier Interactive Patient Education [employer].

## 2016-03-17 NOTE — Telephone Encounter (Signed)
Pt wife calling stating pt is having more SOB  Not having it at the moment  Would like for [place] to take a look at him Pt is coming today at 130 to see Christell Faith,

## 2016-03-17 NOTE — Progress Notes (Signed)
Cardiology Office Note Date:  03/17/2016  Patient ID:  [name], [name] [birth year], MRN [id number] PCP:  Wilhemena Durie, MD  Cardiologist:  Dr. Rockey Situ, MD    Chief Complaint: SOB  History of Present Illness: Joseph [name] is a 82 y.o. male with history of CAD s/p 4 vessel CABG in 1994 per wife, chronic combined CHF, PAD with reported right SFA stenting, HLD, poorly controlled DM2, history of tobacco abuse via pipe smoking for 15-20 years quitting in 1982, arthritis of the knees, history of fatigue, SOB, and generalized weakness, which have been felt to be his anginal equivalent who presents today for evaluation of increased SOB.   Last cardiac catheterization from 08/16/2012, in the setting of progressive SOB and fatigue felt to possibly be his anginal equivalant, showed severe 3 vesel disease with patent grafts x 4. There was 80% disease of a small to moderate sized distal left circumflex. Ejection fraction 45-50%. No significant aortic valve stenosis or mitral valve regurgitation. The findings were discussed with interventional cardiology and medical management was recommended for the distal left circumflex disease. Echo from 06/2012 showed EF 55-60%, normal WM, GR1DD, mild MR. Patient was seen in June 2015 with progressive DOE. He was changed from HCTZ to Lasix. He diuresed 12 pounds. Echo from 04/2014 showed EF 55-60%, no RWMA, LV diastolci function normal, mild MR, LA mildly dilated, PASP normal. Despite the above diuresis and echo he continued to be SOB. It was suspected this may be his anginal equalivent, however the patient did not want to move forward with cardiac cath at that time. Imdur was added. In subsequent follow ups he was doing well with a goal weight of 225 pounds.  Over the past several months he has been noticing a steady downward decline in his functional capacity including unable to walk > 100 feet without having to stop and take a break, unable to take the trash  out, unable to climb stairs without having to stop for breaks. His weight has been approximately stable with him weight 230 pounds in December 2016 with no medication changes at that time and him weighing 233 pounds today. He was seen in January by his PCP for LE cramps felt to be in the setting of his diuretic and electrolyte abnormalities. Unfortunately, there were no labs checked at that time to assess for such. He had been skipping his torsemide dose every coupe of days at that time in the setting of his cramps. His PCP wanted him to avoid diuretics, though cardiology has advised him this could not be done given his persistent volume overload issues 2/2 poor diet/CHF. Since January he has been taking torsemide 20 mg daily without missing any doses or having any cramps. Also, upon reviewing his medications it was seen that metolazone was on his medication list today, though not on his medication list at his last cardiology or PCP visits. He was last prescribed metolazone in 2015. He reports not taking this medication. He has stable 3-pillow orthopnea in the setting of GERD per his PCP. He denies any chest pain, early satiety, diaphoresis, nausea, vomiting, or worsening LE edema. He was previously off Norvasc given LE swelling, at some point this was restarted. Since restarting Norvasc he does note trace LE swelling.    Past Medical History  Diagnosis Date  . CAD (coronary artery disease)     a. s/p CABG;  b. 07/2012 Cath: 3VD including 80 dLCX (med Rx), 4/4 patent grafts.  . Diabetes  mellitus, type 2 ([place])   . Hyperlipidemia   . HTN (hypertension)   . Hypothyroidism   . Chronic diastolic CHF (congestive heart failure) ([place])     a. 07/2012 Echo: EF 55-60%, no rwma, Gr 1 DD, mild MR;  04/2014 Echo: EF 55-60%, no rwma, mild MR, mildly dil LA.  Marland Kitchen Lower extremity edema     a. 03/2014 amlodipine d/c'd.    Past Surgical History  Procedure Laterality Date  . Prostate biopsy    . Hemorrhoid surgery    .  Appendectomy    . Toe surgery      ingrown toe nail  . Coronary artery bypass graft  1994  . Cardiac catheterization  07/2012    [facility]/no stents  . Corneal transplant      Current Outpatient Prescriptions  Medication Sig Dispense Refill  . amLODipine (NORVASC) 10 MG tablet Take 10 mg by mouth daily.    . Ascorbic Acid (VITAMIN C) 100 MG tablet Take by mouth.    Marland Kitchen atorvastatin (LIPITOR) 40 MG tablet Take 1 tablet (40 mg total) by mouth daily. 90 tablet 3  . cholecalciferol (VITAMIN D) 1000 UNITS tablet Take 2,000 Units by mouth daily.    . ciclopirox (PENLAC) 8 % solution     . doxazosin (CARDURA) 4 MG tablet TAKE ONE TABLET BY MOUTH TWICE DAILY 60 tablet 3  . doxazosin (CARDURA) 4 MG tablet TAKE ONE TABLET BY MOUTH TWICE DAILY 60 tablet 6  . hydrALAZINE (APRESOLINE) 25 MG tablet Take 1 tablet (25 mg total) by mouth 2 (two) times daily. 60 tablet 12  . insulin aspart (NOVOLOG) 100 UNIT/ML injection Inject into the skin 3 (three) times daily before meals. 26-16-28    . insulin glargine (LANTUS) 100 UNIT/ML injection Inject 70 Units into the skin at bedtime.     . isosorbide mononitrate (IMDUR) 30 MG 24 hr tablet Take 1 tablet (30 mg total) by mouth daily. 90 tablet 3  . levothyroxine (SYNTHROID, LEVOTHROID) 50 MCG tablet Take 50 mcg by mouth daily before breakfast.    . lisinopril (PRINIVIL,ZESTRIL) 40 MG tablet TAKE ONE TABLET BY MOUTH ONCE DAILY 90 tablet 3  . Magnesium 250 MG TABS Take by mouth.    . nitroGLYCERIN (NITROSTAT) 0.4 MG SL tablet Place 1 tablet (0.4 mg total) under the tongue every 5 (five) minutes as needed for chest pain. 25 tablet 3  . omeprazole (PRILOSEC) 20 MG capsule TAKE ONE CAPSULE BY MOUTH ONCE DAILY 30 capsule 0  . potassium chloride (K-DUR) 10 MEQ tablet TAKE TWO TABLETS BY MOUTH ONCE DAILY 60 tablet 5  . prednisoLONE acetate (PRED FORTE) 1 % ophthalmic suspension Place 1 drop into the left eye daily.    Marland Kitchen torsemide (DEMADEX) 20 MG tablet Take 1 tablet (20 mg  total) by mouth daily as needed. 30 tablet 12  . aspirin EC 81 MG tablet Take 1 tablet (81 mg total) by mouth daily. 90 tablet 3   No current facility-administered medications for this visit.    Allergies:   Review of patient's allergies indicates no known allergies.   Social History:  The patient  reports that he quit smoking about 35 years ago. His smoking use included Cigarettes. He smoked 1.00 pack per day. He has never used smokeless tobacco. He reports that he does not drink alcohol or use illicit drugs.   Family History:  The patient's family history includes Heart attack in his father; Heart failure in his maternal grandfather, maternal uncle, and  mother.  ROS:   Review of Systems  Constitutional: Positive for malaise/fatigue. Negative for fever, chills, weight loss and diaphoresis.  HENT: Negative for congestion.   Eyes: Negative for discharge and redness.  Respiratory: Positive for shortness of breath. Negative for cough, hemoptysis, sputum production and wheezing.   Cardiovascular: Positive for orthopnea and leg swelling. Negative for chest pain, palpitations, claudication and PND.  Gastrointestinal: Positive for heartburn. Negative for nausea, vomiting and abdominal pain.  Musculoskeletal: Negative for myalgias and falls.  Skin: Negative for rash.  Neurological: Positive for weakness. Negative for dizziness, tingling, tremors, sensory change, speech change, focal weakness and loss of consciousness.  Endo/Heme/Allergies: Does not bruise/bleed easily.  Psychiatric/Behavioral: Negative for substance abuse. The patient is not nervous/anxious.       PHYSICAL EXAM:  VS:  BP 120/50 mmHg  Pulse 86  Ht 5\' 7"  (1.702 m)  Wt 233 lb 6.4 oz (105.87 kg)  BMI 36.55 kg/m2  SpO2 95% BMI: Body mass index is 36.55 kg/(m^2). Well nourished, well developed, in no acute distress HEENT: normocephalic, atraumatic Neck: no JVD, carotid bruits or masses Cardiac:  normal S1, S2; RRR; no  murmurs, rubs, or gallops Lungs:  clear to auscultation bilaterally, no wheezing, rhonchi or rales Abd: obese, soft, nontender, no hepatomegaly, + BS MS: no deformity or atrophy Ext: trace bilateral pre-tibial edema Skin: warm and dry, no rash Neuro:  moves all extremities spontaneously, no focal abnormalities noted, follows commands Psych: euthymic mood, full affect   EKG:  Was ordered today. Shows NSR, 76 bpm, left axis deviation, delayed R wave progression, low voltage QRS, no acute st/t changes  Recent Labs: 07/06/2015: BUN 16; Creatinine 1.0; Hemoglobin 13.5; Platelets 176; Potassium 4.4; Sodium 142  07/06/2015: Cholesterol 148; HDL 36; LDL Cholesterol 91; Triglycerides 191*   CrCl cannot be calculated (Patient has no serum creatinine result on file.).   Wt Readings from Last 3 Encounters:  03/17/16 233 lb 6.4 oz (105.87 kg)  11/24/15 232 lb (105.235 kg)  10/26/15 230 lb 12 oz (104.668 kg)     Other studies reviewed: Additional studies/records reviewed today include: summarized above  ASSESSMENT AND PLAN:  1. SOB/generalized fatigue/maliase: Concern this is his anginal equivalent as below. Weight has been stable for the most pasrt as above and he does not appear to be volume overloaded at this time. Goal weight of 225 pounds. Will have him double his torsemide to 20 mg bid x 3 days with added KCl repletion then go back down to his usual dosing. Schedule cardiac cath as below as he wishes to avoid nuclear stress testing at this time. Metolazone was on his medication list today, which was not on his medication list at his last PCP or cardiology visit nor was this medication added back to his regimen during those visits. It was last prescribed in 2015. He has not been taking this medication. I have taken it off his medication list.   2. CAD s/p CABG as above: See above. No chest pain, but progressive dyspnea which is similar to his symptoms prior to his CABG. There is concern this  progressive DOE may be his anginal equivalent. Cardiac catheterization was previously recommended in 04/2014; however the patient declined and there was no further mention as his symptoms improved. At this time he is concern for possible ischemia. He would like to proceed with cardiac cath over nuclear stress testing at this time. He will be scheduled for cardiac cath 03/31/2016 with Dr. Rockey Situ, MD. Risks and benefits  of cardiac catheterization have been discussed with the patient including risks of bleeding, bruising, infection, kidney damage, stroke, heart attack, and death. The patient understands these risks and is willing to proceed with the procedure. All questions have been answered and concerns listened to. Continue aspirin 81 mg and Imdur 30 mg daily. Would consider changing out Norvasc for more heart healthy medication such as Coreg or Toprol XL. Continue Lipitor 40 mg, defer FLP/LFTs to PCP.   3. Chronic diastolic CHF: As above. Would not hold diuretics at this time unless dictated by patient's volume status/renal function. Regarding history of cramps, would treat electrolytes. CHF education provided. Lifestyle changes encouraged.   4. HTN: Well controlled. Continue current medications as above.   5. HLD: Lipitor.  6. IDDM: Insulin per PCP.  Disposition: F/u with Dr. Rockey Situ, MD s/p cardiac cath.  Current medicines are reviewed at length with the patient today.  The patient did not have any concerns regarding medicines.  Melvern Banker PA-C 03/17/2016 3:12 PM     [address], [place] [address] [phone]

## 2016-03-18 ENCOUNTER — Other Ambulatory Visit
Admission: RE | Admit: 2016-03-18 | Discharge: 2016-03-18 | Disposition: A | Discharge status: Home / Self Care | Payer: PPO | Source: Ambulatory Visit | Attending: Physician Assistant | Admitting: Physician Assistant

## 2016-03-18 ENCOUNTER — Ambulatory Visit
Admission: RE | Admit: 2016-03-18 | Discharge: 2016-03-18 | Disposition: A | Discharge status: Home / Self Care | Payer: PPO | Source: Ambulatory Visit | Attending: Physician Assistant | Admitting: Physician Assistant

## 2016-03-18 ENCOUNTER — Other Ambulatory Visit: Payer: Self-pay

## 2016-03-18 DIAGNOSIS — R001 Bradycardia, unspecified: Secondary | ICD-10-CM | POA: Diagnosis not present

## 2016-03-18 DIAGNOSIS — Z01818 Encounter for other preprocedural examination: Secondary | ICD-10-CM | POA: Insufficient documentation

## 2016-03-18 DIAGNOSIS — Z8249 Family history of ischemic heart disease and other diseases of the circulatory system: Secondary | ICD-10-CM | POA: Diagnosis not present

## 2016-03-18 DIAGNOSIS — K5732 Diverticulitis of large intestine without perforation or abscess without bleeding: Secondary | ICD-10-CM | POA: Diagnosis not present

## 2016-03-18 DIAGNOSIS — I2510 Atherosclerotic heart disease of native coronary artery without angina pectoris: Secondary | ICD-10-CM

## 2016-03-18 DIAGNOSIS — Z7982 Long term (current) use of aspirin: Secondary | ICD-10-CM

## 2016-03-18 DIAGNOSIS — I5022 Chronic systolic (congestive) heart failure: Secondary | ICD-10-CM | POA: Diagnosis not present

## 2016-03-18 DIAGNOSIS — R0600 Dyspnea, unspecified: Secondary | ICD-10-CM | POA: Diagnosis not present

## 2016-03-18 DIAGNOSIS — E038 Other specified hypothyroidism: Secondary | ICD-10-CM | POA: Diagnosis present

## 2016-03-18 DIAGNOSIS — K219 Gastro-esophageal reflux disease without esophagitis: Secondary | ICD-10-CM | POA: Diagnosis present

## 2016-03-18 DIAGNOSIS — I5032 Chronic diastolic (congestive) heart failure: Secondary | ICD-10-CM | POA: Diagnosis present

## 2016-03-18 DIAGNOSIS — K810 Acute cholecystitis: Secondary | ICD-10-CM | POA: Diagnosis not present

## 2016-03-18 DIAGNOSIS — Z794 Long term (current) use of insulin: Secondary | ICD-10-CM | POA: Diagnosis not present

## 2016-03-18 DIAGNOSIS — K8000 Calculus of gallbladder with acute cholecystitis without obstruction: Principal | ICD-10-CM | POA: Diagnosis present

## 2016-03-18 DIAGNOSIS — R0602 Shortness of breath: Secondary | ICD-10-CM | POA: Insufficient documentation

## 2016-03-18 DIAGNOSIS — R1010 Upper abdominal pain, unspecified: Secondary | ICD-10-CM | POA: Diagnosis not present

## 2016-03-18 DIAGNOSIS — N182 Chronic kidney disease, stage 2 (mild): Secondary | ICD-10-CM | POA: Diagnosis present

## 2016-03-18 DIAGNOSIS — I130 Hypertensive heart and chronic kidney disease with heart failure and stage 1 through stage 4 chronic kidney disease, or unspecified chronic kidney disease: Secondary | ICD-10-CM | POA: Diagnosis present

## 2016-03-18 DIAGNOSIS — J9601 Acute respiratory failure with hypoxia: Secondary | ICD-10-CM | POA: Diagnosis not present

## 2016-03-18 DIAGNOSIS — R0789 Other chest pain: Secondary | ICD-10-CM | POA: Diagnosis not present

## 2016-03-18 DIAGNOSIS — R7989 Other specified abnormal findings of blood chemistry: Secondary | ICD-10-CM

## 2016-03-18 DIAGNOSIS — Z6835 Body mass index (BMI) 35.0-35.9, adult: Secondary | ICD-10-CM

## 2016-03-18 DIAGNOSIS — I25709 Atherosclerosis of coronary artery bypass graft(s), unspecified, with unspecified angina pectoris: Secondary | ICD-10-CM | POA: Diagnosis not present

## 2016-03-18 DIAGNOSIS — Z87891 Personal history of nicotine dependence: Secondary | ICD-10-CM

## 2016-03-18 DIAGNOSIS — K8020 Calculus of gallbladder without cholecystitis without obstruction: Secondary | ICD-10-CM | POA: Diagnosis not present

## 2016-03-18 DIAGNOSIS — E1122 Type 2 diabetes mellitus with diabetic chronic kidney disease: Secondary | ICD-10-CM | POA: Diagnosis present

## 2016-03-18 DIAGNOSIS — Z951 Presence of aortocoronary bypass graft: Secondary | ICD-10-CM | POA: Diagnosis not present

## 2016-03-18 DIAGNOSIS — N170 Acute kidney failure with tubular necrosis: Secondary | ICD-10-CM | POA: Diagnosis present

## 2016-03-18 DIAGNOSIS — R1012 Left upper quadrant pain: Secondary | ICD-10-CM | POA: Diagnosis not present

## 2016-03-18 DIAGNOSIS — E785 Hyperlipidemia, unspecified: Secondary | ICD-10-CM | POA: Diagnosis present

## 2016-03-18 DIAGNOSIS — R079 Chest pain, unspecified: Secondary | ICD-10-CM | POA: Diagnosis not present

## 2016-03-18 LAB — CBC WITH DIFFERENTIAL/PLATELET
BAND NEUTROPHILS: 0 %
BLASTS: 0 %
Basophils Absolute: 0.1 10*3/uL (ref 0–0.1)
Basophils Relative: 1 %
EOS ABS: 0.3 10*3/uL (ref 0–0.7)
Eosinophils Relative: 4 %
HEMATOCRIT: 35.8 % — AB (ref 40.0–52.0)
HEMOGLOBIN: 12.1 g/dL — AB (ref 13.0–18.0)
LYMPHS PCT: 22 %
Lymphs Abs: 1.9 10*3/uL (ref 1.0–3.6)
MCH: 30.6 pg (ref 26.0–34.0)
MCHC: 33.7 g/dL (ref 32.0–36.0)
MCV: 90.8 fL (ref 80.0–100.0)
MYELOCYTES: 0 %
Metamyelocytes Relative: 0 %
Monocytes Absolute: 0.1 10*3/uL — ABNORMAL LOW (ref 0.2–1.0)
Monocytes Relative: 1 %
NEUTROS PCT: 72 %
Neutro Abs: 6.2 10*3/uL (ref 1.4–6.5)
OTHER: 0 %
PROMYELOCYTES ABS: 0 %
Platelets: 187 10*3/uL (ref 150–440)
RBC: 3.94 MIL/uL — ABNORMAL LOW (ref 4.40–5.90)
RDW: 14.2 % (ref 11.5–14.5)
WBC: 8.6 10*3/uL (ref 3.8–10.6)
nRBC: 0 /100 WBC

## 2016-03-18 LAB — BASIC METABOLIC PANEL
ANION GAP: 8 (ref 5–15)
BUN: 29 mg/dL — ABNORMAL HIGH (ref 6–20)
CALCIUM: 8.8 mg/dL — AB (ref 8.9–10.3)
CO2: 26 mmol/L (ref 22–32)
Chloride: 105 mmol/L (ref 101–111)
Creatinine, Ser: 1.41 mg/dL — ABNORMAL HIGH (ref 0.61–1.24)
GFR, EST AFRICAN AMERICAN: 50 mL/min — AB (ref >60)
GFR, EST NON AFRICAN AMERICAN: 43 mL/min — AB (ref >60)
GLUCOSE: 92 mg/dL (ref 65–99)
POTASSIUM: 3.7 mmol/L (ref 3.5–5.1)
SODIUM: 139 mmol/L (ref 135–145)

## 2016-03-18 LAB — PROTIME-INR
INR: 1.09
Prothrombin Time: 14.3 seconds (ref 11.4–15.0)

## 2016-03-21 ENCOUNTER — Inpatient Hospital Stay
Admission: EM | Admit: 2016-03-21 | Discharge: 2016-03-28 | DRG: 444 | Disposition: A | Discharge status: Home Health | Payer: PPO | Attending: Internal Medicine | Admitting: Internal Medicine

## 2016-03-21 ENCOUNTER — Emergency Department: Payer: PPO

## 2016-03-21 ENCOUNTER — Encounter: Payer: Self-pay | Admitting: Emergency Medicine

## 2016-03-21 DIAGNOSIS — Z01818 Encounter for other preprocedural examination: Secondary | ICD-10-CM | POA: Diagnosis not present

## 2016-03-21 DIAGNOSIS — K8020 Calculus of gallbladder without cholecystitis without obstruction: Secondary | ICD-10-CM | POA: Diagnosis not present

## 2016-03-21 DIAGNOSIS — R0602 Shortness of breath: Secondary | ICD-10-CM | POA: Insufficient documentation

## 2016-03-21 DIAGNOSIS — J189 Pneumonia, unspecified organism: Secondary | ICD-10-CM

## 2016-03-21 DIAGNOSIS — K810 Acute cholecystitis: Secondary | ICD-10-CM | POA: Diagnosis not present

## 2016-03-21 DIAGNOSIS — E1122 Type 2 diabetes mellitus with diabetic chronic kidney disease: Secondary | ICD-10-CM | POA: Diagnosis not present

## 2016-03-21 DIAGNOSIS — E785 Hyperlipidemia, unspecified: Secondary | ICD-10-CM | POA: Diagnosis not present

## 2016-03-21 DIAGNOSIS — R0600 Dyspnea, unspecified: Secondary | ICD-10-CM | POA: Diagnosis not present

## 2016-03-21 DIAGNOSIS — N182 Chronic kidney disease, stage 2 (mild): Secondary | ICD-10-CM | POA: Diagnosis not present

## 2016-03-21 DIAGNOSIS — K819 Cholecystitis, unspecified: Secondary | ICD-10-CM

## 2016-03-21 DIAGNOSIS — N170 Acute kidney failure with tubular necrosis: Secondary | ICD-10-CM | POA: Diagnosis not present

## 2016-03-21 DIAGNOSIS — Z951 Presence of aortocoronary bypass graft: Secondary | ICD-10-CM | POA: Diagnosis not present

## 2016-03-21 DIAGNOSIS — R001 Bradycardia, unspecified: Secondary | ICD-10-CM | POA: Diagnosis not present

## 2016-03-21 DIAGNOSIS — Z8249 Family history of ischemic heart disease and other diseases of the circulatory system: Secondary | ICD-10-CM | POA: Diagnosis not present

## 2016-03-21 DIAGNOSIS — R0789 Other chest pain: Secondary | ICD-10-CM | POA: Diagnosis not present

## 2016-03-21 DIAGNOSIS — J9601 Acute respiratory failure with hypoxia: Secondary | ICD-10-CM | POA: Diagnosis not present

## 2016-03-21 DIAGNOSIS — R1010 Upper abdominal pain, unspecified: Secondary | ICD-10-CM | POA: Diagnosis not present

## 2016-03-21 DIAGNOSIS — I5022 Chronic systolic (congestive) heart failure: Secondary | ICD-10-CM | POA: Diagnosis not present

## 2016-03-21 DIAGNOSIS — R079 Chest pain, unspecified: Secondary | ICD-10-CM | POA: Diagnosis not present

## 2016-03-21 DIAGNOSIS — R109 Unspecified abdominal pain: Secondary | ICD-10-CM | POA: Insufficient documentation

## 2016-03-21 DIAGNOSIS — K8000 Calculus of gallbladder with acute cholecystitis without obstruction: Secondary | ICD-10-CM | POA: Diagnosis not present

## 2016-03-21 DIAGNOSIS — K5732 Diverticulitis of large intestine without perforation or abscess without bleeding: Secondary | ICD-10-CM | POA: Diagnosis not present

## 2016-03-21 DIAGNOSIS — Z7982 Long term (current) use of aspirin: Secondary | ICD-10-CM | POA: Diagnosis not present

## 2016-03-21 DIAGNOSIS — I2510 Atherosclerotic heart disease of native coronary artery without angina pectoris: Secondary | ICD-10-CM | POA: Diagnosis not present

## 2016-03-21 DIAGNOSIS — Z6835 Body mass index (BMI) 35.0-35.9, adult: Secondary | ICD-10-CM | POA: Diagnosis not present

## 2016-03-21 DIAGNOSIS — R1012 Left upper quadrant pain: Secondary | ICD-10-CM | POA: Diagnosis not present

## 2016-03-21 DIAGNOSIS — K219 Gastro-esophageal reflux disease without esophagitis: Secondary | ICD-10-CM | POA: Diagnosis not present

## 2016-03-21 DIAGNOSIS — E038 Other specified hypothyroidism: Secondary | ICD-10-CM | POA: Diagnosis not present

## 2016-03-21 DIAGNOSIS — Z87891 Personal history of nicotine dependence: Secondary | ICD-10-CM | POA: Diagnosis not present

## 2016-03-21 DIAGNOSIS — R1084 Generalized abdominal pain: Secondary | ICD-10-CM

## 2016-03-21 DIAGNOSIS — I130 Hypertensive heart and chronic kidney disease with heart failure and stage 1 through stage 4 chronic kidney disease, or unspecified chronic kidney disease: Secondary | ICD-10-CM | POA: Diagnosis not present

## 2016-03-21 DIAGNOSIS — I25709 Atherosclerosis of coronary artery bypass graft(s), unspecified, with unspecified angina pectoris: Secondary | ICD-10-CM | POA: Diagnosis not present

## 2016-03-21 DIAGNOSIS — Z794 Long term (current) use of insulin: Secondary | ICD-10-CM | POA: Diagnosis not present

## 2016-03-21 DIAGNOSIS — I5032 Chronic diastolic (congestive) heart failure: Secondary | ICD-10-CM | POA: Diagnosis not present

## 2016-03-21 HISTORY — DX: Acute respiratory failure with hypoxia: J96.01

## 2016-03-21 LAB — LIPASE, BLOOD: LIPASE: 21 U/L (ref 11–51)

## 2016-03-21 LAB — GLUCOSE, CAPILLARY
GLUCOSE-CAPILLARY: 155 mg/dL — AB (ref 65–99)
GLUCOSE-CAPILLARY: 233 mg/dL — AB (ref 65–99)
Glucose-Capillary: 185 mg/dL — ABNORMAL HIGH (ref 65–99)
Glucose-Capillary: 180 mg/dL — ABNORMAL HIGH (ref 65–99)

## 2016-03-21 LAB — BASIC METABOLIC PANEL
ANION GAP: 8 (ref 5–15)
BUN: 32 mg/dL — ABNORMAL HIGH (ref 6–20)
CALCIUM: 8.8 mg/dL — AB (ref 8.9–10.3)
CO2: 26 mmol/L (ref 22–32)
Chloride: 105 mmol/L (ref 101–111)
Creatinine, Ser: 1.63 mg/dL — ABNORMAL HIGH (ref 0.61–1.24)
GFR, EST AFRICAN AMERICAN: 42 mL/min — AB (ref >60)
GFR, EST NON AFRICAN AMERICAN: 36 mL/min — AB (ref >60)
GLUCOSE: 199 mg/dL — AB (ref 65–99)
POTASSIUM: 4.5 mmol/L (ref 3.5–5.1)
SODIUM: 139 mmol/L (ref 135–145)

## 2016-03-21 LAB — CBC
HCT: 37.5 % — ABNORMAL LOW (ref 40.0–52.0)
HEMOGLOBIN: 12.5 g/dL — AB (ref 13.0–18.0)
MCH: 30.9 pg (ref 26.0–34.0)
MCHC: 33.3 g/dL (ref 32.0–36.0)
MCV: 92.6 fL (ref 80.0–100.0)
Platelets: 197 10*3/uL (ref 150–440)
RBC: 4.05 MIL/uL — AB (ref 4.40–5.90)
RDW: 13.8 % (ref 11.5–14.5)
WBC: 7.4 10*3/uL (ref 3.8–10.6)

## 2016-03-21 LAB — HEPATIC FUNCTION PANEL
ALBUMIN: 3.3 g/dL — AB (ref 3.5–5.0)
ALT: 25 U/L (ref 17–63)
AST: 38 U/L (ref 15–41)
Alkaline Phosphatase: 43 U/L (ref 38–126)
Bilirubin, Direct: <0.1 mg/dL — ABNORMAL LOW (ref 0.1–0.5)
TOTAL PROTEIN: 7.1 g/dL (ref 6.5–8.1)
Total Bilirubin: 0.4 mg/dL (ref 0.3–1.2)

## 2016-03-21 LAB — TROPONIN I

## 2016-03-21 MED ORDER — ASPIRIN EC 81 MG PO TBEC: 81.0000 mg | DELAYED_RELEASE_TABLET | Freq: Every day | ORAL | Status: DC

## 2016-03-21 MED ORDER — HYDROCODONE-ACETAMINOPHEN 5-325 MG PO TABS
1.0000 | ORAL_TABLET | ORAL | Status: DC | PRN
  Administered 2016-03-22 – 2016-03-23 (×4): 2 via ORAL
  Administered 2016-03-25 – 2016-03-27 (×7): 1 via ORAL
  Administered 2016-03-28 (×2): 2 via ORAL
  Filled 2016-03-21 (×3): qty 1
  Filled 2016-03-21: qty 2
  Filled 2016-03-21: qty 1
  Filled 2016-03-21 (×2): qty 2
  Filled 2016-03-21: qty 1
  Filled 2016-03-21 (×3): qty 2
  Filled 2016-03-21 (×2): qty 1

## 2016-03-21 MED ORDER — METRONIDAZOLE IN NACL 5-0.79 MG/ML-% IV SOLN
500.0000 mg | Freq: Two times a day (BID) | INTRAVENOUS | Status: DC
  Administered 2016-03-21 – 2016-03-23 (×4): 500 mg via INTRAVENOUS
  Filled 2016-03-21 (×5): qty 100

## 2016-03-21 MED ORDER — HYDROMORPHONE HCL 1 MG/ML IJ SOLN
1.0000 mg | Freq: Once | INTRAMUSCULAR | Status: AC
  Administered 2016-03-21: 1 mg via INTRAVENOUS
  Filled 2016-03-21: qty 1

## 2016-03-21 MED ORDER — CARVEDILOL 3.125 MG PO TABS
3.1250 mg | ORAL_TABLET | Freq: Two times a day (BID) | ORAL | Status: DC
  Administered 2016-03-21 – 2016-03-28 (×14): 3.125 mg via ORAL
  Filled 2016-03-21 (×13): qty 1

## 2016-03-21 MED ORDER — ASPIRIN EC 81 MG PO TBEC
81.0000 mg | DELAYED_RELEASE_TABLET | Freq: Every day | ORAL | Status: DC
  Administered 2016-03-22 – 2016-03-28 (×7): 81 mg via ORAL
  Filled 2016-03-21 (×7): qty 1

## 2016-03-21 MED ORDER — PANTOPRAZOLE SODIUM 40 MG PO TBEC: 40.0000 mg | DELAYED_RELEASE_TABLET | Freq: Every day | ORAL | Status: DC

## 2016-03-21 MED ORDER — ONDANSETRON HCL 4 MG/2ML IJ SOLN
4.0000 mg | Freq: Once | INTRAMUSCULAR | Status: AC
  Administered 2016-03-21: 4 mg via INTRAVENOUS
  Filled 2016-03-21: qty 2

## 2016-03-21 MED ORDER — DOXAZOSIN MESYLATE 4 MG PO TABS
4.0000 mg | ORAL_TABLET | Freq: Two times a day (BID) | ORAL | Status: DC
  Administered 2016-03-21 – 2016-03-28 (×13): 4 mg via ORAL
  Filled 2016-03-21 (×13): qty 1

## 2016-03-21 MED ORDER — PIPERACILLIN-TAZOBACTAM 3.375 G IVPB 30 MIN
3.3750 g | Freq: Once | INTRAVENOUS | Status: AC
  Administered 2016-03-21: 3.375 g via INTRAVENOUS
  Filled 2016-03-21: qty 50

## 2016-03-21 MED ORDER — ACETAMINOPHEN 325 MG PO TABS
650.0000 mg | ORAL_TABLET | Freq: Four times a day (QID) | ORAL | Status: DC | PRN
  Administered 2016-03-23 – 2016-03-24 (×3): 650 mg via ORAL
  Filled 2016-03-21 (×3): qty 2

## 2016-03-21 MED ORDER — INSULIN GLARGINE 100 UNIT/ML ~~LOC~~ SOLN
70.0000 [IU] | Freq: Every day | SUBCUTANEOUS | Status: DC
  Administered 2016-03-21 – 2016-03-27 (×7): 70 [IU] via SUBCUTANEOUS
  Filled 2016-03-21 (×8): qty 0.7

## 2016-03-21 MED ORDER — DOCUSATE SODIUM 100 MG PO CAPS
100.0000 mg | ORAL_CAPSULE | Freq: Two times a day (BID) | ORAL | Status: DC
  Administered 2016-03-21 – 2016-03-23 (×5): 100 mg via ORAL
  Filled 2016-03-21 (×5): qty 1

## 2016-03-21 MED ORDER — METRONIDAZOLE IN NACL 5-0.79 MG/ML-% IV SOLN
500.0000 mg | Freq: Two times a day (BID) | INTRAVENOUS | Status: DC
  Filled 2016-03-21: qty 100

## 2016-03-21 MED ORDER — HYDROMORPHONE HCL 1 MG/ML IJ SOLN
1.0000 mg | INTRAMUSCULAR | Status: DC | PRN
  Administered 2016-03-22: 1 mg via INTRAVENOUS
  Filled 2016-03-21 (×2): qty 1

## 2016-03-21 MED ORDER — ACETAMINOPHEN 650 MG RE SUPP: 650.0000 mg | Freq: Four times a day (QID) | RECTAL | Status: DC | PRN

## 2016-03-21 MED ORDER — TORSEMIDE 20 MG PO TABS
20.0000 mg | ORAL_TABLET | Freq: Every day | ORAL | Status: DC
  Administered 2016-03-21 – 2016-03-23 (×3): 20 mg via ORAL
  Filled 2016-03-21 (×4): qty 1

## 2016-03-21 MED ORDER — ATORVASTATIN CALCIUM 20 MG PO TABS: 40.0000 mg | ORAL_TABLET | Freq: Every day | ORAL | Status: DC

## 2016-03-21 MED ORDER — MORPHINE SULFATE (PF) 4 MG/ML IV SOLN
4.0000 mg | Freq: Once | INTRAVENOUS | Status: AC
  Administered 2016-03-21: 4 mg via INTRAVENOUS
  Filled 2016-03-21: qty 1

## 2016-03-21 MED ORDER — SODIUM CHLORIDE 0.9 % IV BOLUS (SEPSIS)
500.0000 mL | Freq: Once | INTRAVENOUS | Status: AC
  Administered 2016-03-21: 500 mL via INTRAVENOUS

## 2016-03-21 MED ORDER — POTASSIUM CHLORIDE CRYS ER 20 MEQ PO TBCR
20.0000 meq | EXTENDED_RELEASE_TABLET | Freq: Every day | ORAL | Status: DC
  Administered 2016-03-21 – 2016-03-23 (×3): 20 meq via ORAL
  Filled 2016-03-21 (×3): qty 1

## 2016-03-21 MED ORDER — PENTAFLUOROPROP-TETRAFLUOROETH EX AERO
INHALATION_SPRAY | CUTANEOUS | Status: AC
  Filled 2016-03-21: qty 30

## 2016-03-21 MED ORDER — NITROGLYCERIN 0.4 MG SL SUBL: 0.4000 mg | SUBLINGUAL_TABLET | SUBLINGUAL | Status: DC | PRN

## 2016-03-21 MED ORDER — SODIUM CHLORIDE 0.9 % IV BOLUS (SEPSIS): 1000.0000 mL | Freq: Once | INTRAVENOUS | Status: DC

## 2016-03-21 MED ORDER — INSULIN ASPART 100 UNIT/ML ~~LOC~~ SOLN
7.0000 [IU] | Freq: Once | SUBCUTANEOUS | Status: AC
  Administered 2016-03-21: 7 [IU] via SUBCUTANEOUS
  Filled 2016-03-21: qty 7

## 2016-03-21 MED ORDER — LEVOTHYROXINE SODIUM 50 MCG PO TABS
50.0000 ug | ORAL_TABLET | Freq: Every day | ORAL | Status: DC
  Administered 2016-03-22 – 2016-03-28 (×7): 50 ug via ORAL
  Filled 2016-03-21 (×7): qty 1

## 2016-03-21 MED ORDER — VITAMIN D 1000 UNITS PO TABS
2000.0000 [IU] | ORAL_TABLET | Freq: Every day | ORAL | Status: DC
  Administered 2016-03-21 – 2016-03-28 (×8): 2000 [IU] via ORAL
  Filled 2016-03-21 (×9): qty 2

## 2016-03-21 MED ORDER — CIPROFLOXACIN IN D5W 400 MG/200ML IV SOLN
400.0000 mg | Freq: Two times a day (BID) | INTRAVENOUS | Status: DC
  Administered 2016-03-21 – 2016-03-23 (×4): 400 mg via INTRAVENOUS
  Filled 2016-03-21 (×6): qty 200

## 2016-03-21 MED ORDER — ONDANSETRON HCL 4 MG/2ML IJ SOLN: 4.0000 mg | Freq: Four times a day (QID) | INTRAMUSCULAR | Status: DC | PRN

## 2016-03-21 MED ORDER — IOPAMIDOL (ISOVUE-370) INJECTION 76%
100.0000 mL | Freq: Once | INTRAVENOUS | Status: AC | PRN
  Administered 2016-03-21: 100 mL via INTRAVENOUS

## 2016-03-21 MED ORDER — ONDANSETRON HCL 4 MG PO TABS: 4.0000 mg | ORAL_TABLET | Freq: Four times a day (QID) | ORAL | Status: DC | PRN

## 2016-03-21 MED ORDER — HYDRALAZINE HCL 25 MG PO TABS
25.0000 mg | ORAL_TABLET | Freq: Two times a day (BID) | ORAL | Status: DC
  Administered 2016-03-21 – 2016-03-28 (×13): 25 mg via ORAL
  Filled 2016-03-21 (×13): qty 1

## 2016-03-21 MED ORDER — BISACODYL 5 MG PO TBEC: 5.0000 mg | DELAYED_RELEASE_TABLET | Freq: Every day | ORAL | Status: DC | PRN

## 2016-03-21 MED ORDER — ATORVASTATIN CALCIUM 20 MG PO TABS
40.0000 mg | ORAL_TABLET | Freq: Every day | ORAL | Status: DC
  Administered 2016-03-21 – 2016-03-28 (×8): 40 mg via ORAL
  Filled 2016-03-21 (×8): qty 2

## 2016-03-21 MED ORDER — PANTOPRAZOLE SODIUM 40 MG PO TBEC
40.0000 mg | DELAYED_RELEASE_TABLET | Freq: Every day | ORAL | Status: DC
  Administered 2016-03-22 – 2016-03-28 (×7): 40 mg via ORAL
  Filled 2016-03-21 (×7): qty 1

## 2016-03-21 MED ORDER — CIPROFLOXACIN IN D5W 400 MG/200ML IV SOLN
400.0000 mg | Freq: Two times a day (BID) | INTRAVENOUS | Status: DC
  Filled 2016-03-21: qty 200

## 2016-03-21 NOTE — Progress Notes (Signed)
Patient admitted to unit from ED, alert and oriented, no c/o pain at this time. Family at  Bedside, fall precaution in place, pt oriented to room and call light.Marland Kitchen

## 2016-03-21 NOTE — ED Notes (Signed)
Pt here with centralized chest pain that radiates down into abdomen and around both flanks; reports pain started 2 hours PTA. Pt reports burning sensation and pressure; states "I feel like I need to belch". Pt denies shob; reports he's had 8 bypasses and a cardiac cath scheduled with Dr Rockey Situ on may 11.

## 2016-03-21 NOTE — ED Provider Notes (Signed)
[facility] Emergency Department Provider Note  ____________________________________________  Time seen: 7:25 AM  I have reviewed the triage vital signs and the nursing notes.   HISTORY  Chief Complaint Chest Pain    HPI Joseph [name] is a 82 y.o. male who complains of central chest pain radiating down to the abdomen and into his bilateral flanks. It started last night, with pressure. It's been constant since then. He feels very full and occasionally nauseated. No exertional symptoms. Not pleuritic. Denies shortness of breath or cough or fever. No vomiting or diarrhea. Pain is severe in intensity, waxing and waning but constant. No identifiable aggravating or alleviating factors. Not positional. Not orthostatic.     Past Medical History  Diagnosis Date  . CAD (coronary artery disease)     a. s/p CABG;  b. 07/2012 Cath: 3VD including 80 dLCX (med Rx), 4/4 patent grafts.  . Diabetes mellitus, type 2 ([place])   . Hyperlipidemia   . HTN (hypertension)   . Hypothyroidism   . Chronic diastolic CHF (congestive heart failure) ([place])     a. 07/2012 Echo: EF 55-60%, no rwma, Gr 1 DD, mild MR;  04/2014 Echo: EF 55-60%, no rwma, mild MR, mildly dil LA.  Marland Kitchen Lower extremity edema     a. 03/2014 amlodipine d/c'd.     Patient Active Problem List   Diagnosis Date Noted  . Back pain, chronic 07/28/2015  . Benign fibroma of prostate 07/28/2015  . Acid reflux 07/28/2015  . Adult hypothyroidism 07/28/2015  . Eunuchoidism 07/28/2015  . Neuropathy ([place]) 07/28/2015  . Arthritis, degenerative 07/28/2015  . Psoriasis 07/28/2015  . Obesity 09/09/2014  . Bilateral leg weakness 09/09/2014  . Diastolic CHF, acute on chronic ([place]) 05/13/2014  . Bilateral leg edema 04/15/2014  . Bradycardia 08/13/2013  . Edema 06/21/2012  . Fatigue 09/15/2011  . Diabetes mellitus with peripheral vascular disease ([place]) 09/15/2011  . DYSPNEA 06/09/2009  . Hyperlipidemia 06/07/2009  .  HYPERTENSION, BENIGN 06/07/2009  . CAD, ARTERY BYPASS GRAFT 06/07/2009     Past Surgical History  Procedure Laterality Date  . Prostate biopsy    . Hemorrhoid surgery    . Appendectomy    . Toe surgery      ingrown toe nail  . Coronary artery bypass graft  1994  . Cardiac catheterization  07/2012    [facility]/no stents  . Corneal transplant       Current Outpatient Rx  Name  Route  Sig  Dispense  Refill  . Ascorbic Acid (VITAMIN C) 100 MG tablet   Oral   Take by mouth.         Marland Kitchen aspirin EC 81 MG tablet   Oral   Take 1 tablet (81 mg total) by mouth daily.   90 tablet   3   . atorvastatin (LIPITOR) 40 MG tablet   Oral   Take 1 tablet (40 mg total) by mouth daily.   90 tablet   3   . cholecalciferol (VITAMIN D) 1000 UNITS tablet   Oral   Take 2,000 Units by mouth daily.         . ciclopirox (PENLAC) 8 % solution               . doxazosin (CARDURA) 4 MG tablet   Oral   Take 4 mg by mouth 2 (two) times daily.         . hydrALAZINE (APRESOLINE) 25 MG tablet   Oral   Take 1 tablet (  25 mg total) by mouth 2 (two) times daily.   60 tablet   12   . insulin aspart (NOVOLOG) 100 UNIT/ML injection   Subcutaneous   Inject into the skin 3 (three) times daily before meals. 26-16-28         . insulin glargine (LANTUS) 100 UNIT/ML injection   Subcutaneous   Inject 70 Units into the skin at bedtime.          Marland Kitchen levothyroxine (SYNTHROID, LEVOTHROID) 50 MCG tablet   Oral   Take 50 mcg by mouth daily before breakfast.         . lisinopril (PRINIVIL,ZESTRIL) 40 MG tablet   Oral   Take 40 mg by mouth daily.         . Magnesium 250 MG TABS   Oral   Take by mouth.         . nitroGLYCERIN (NITROSTAT) 0.4 MG SL tablet   Sublingual   Place 1 tablet (0.4 mg total) under the tongue every 5 (five) minutes as needed for chest pain.   25 tablet   3   . omeprazole (PRILOSEC) 20 MG capsule   Oral   Take 20 mg by mouth daily.         . potassium chloride  (K-DUR) 10 MEQ tablet   Oral   Take 20 mEq by mouth daily.         Marland Kitchen torsemide (DEMADEX) 20 MG tablet   Oral   Take 1 tablet (20 mg total) by mouth daily as needed.   30 tablet   12      Allergies Review of patient's allergies indicates no known allergies.   Family History  Problem Relation Age of Onset  . Heart failure Mother   . Heart attack Father   . Heart failure Maternal Uncle   . Heart failure Maternal Grandfather     Social History Social History  Substance Use Topics  . Smoking status: Former Smoker -- 1.00 packs/day    Types: Cigarettes    Quit date: 12/23/1980  . Smokeless tobacco: Never Used  . Alcohol Use: No    Review of Systems  Constitutional:   No fever or chills.  Eyes:   No vision changes.  ENT:   No sore throat. No rhinorrhea. Cardiovascular:   Positive as above chest pain. Respiratory:   No dyspnea or cough. Gastrointestinal:   Positive as above abdominal pain without vomiting and diarrhea.  Genitourinary:   Negative for dysuria or difficulty urinating. Musculoskeletal:   Negative for focal pain or swelling Neurological:   Negative for headaches 10-point ROS otherwise negative.  ____________________________________________   PHYSICAL EXAM:  VITAL SIGNS: ED Triage Vitals  Enc Vitals Group     BP 03/21/16 0707 173/54 mmHg     Pulse Rate 03/21/16 0707 55     Resp 03/21/16 0707 20     Temp 03/21/16 0707 97.7 F (36.5 C)     Temp Source 03/21/16 0707 Oral     SpO2 03/21/16 0707 94 %     Weight 03/21/16 0707 225 lb (102.059 kg)     Height 03/21/16 0707 5\' 7"  (1.702 m)     Head Cir --      Peak Flow --      Pain Score 03/21/16 0708 6     Pain Loc --      Pain Edu? --      Excl. in [place]? --     Vital signs reviewed, nursing  assessments reviewed.   Constitutional:   Alert and oriented. Uncomfortable but not in distress. Eyes:   No scleral icterus. No conjunctival pallor. PERRL. EOMI.  No nystagmus. ENT   Head:    Normocephalic and atraumatic.   Nose:   No congestion/rhinnorhea. No septal hematoma   Mouth/Throat:   MMM, no pharyngeal erythema. No peritonsillar mass.    Neck:   No stridor. No SubQ emphysema. No meningismus. Hematological/Lymphatic/Immunilogical:   No cervical lymphadenopathy. Cardiovascular:   RRR. Symmetric bilateral radial and DP pulses.  No murmurs.  Respiratory:   Normal respiratory effort without tachypnea nor retractions. Breath sounds are clear and equal bilaterally. No wheezes/rales/rhonchi. Gastrointestinal:   Soft with right upper quadrant epigastric and left lower quadrant tenderness.. Non distended. There is no CVA tenderness.  No rebound, rigidity, or guarding. Genitourinary:   deferred Musculoskeletal:   Nontender with normal range of motion in all extremities. No joint effusions.  No lower extremity tenderness.  No edema. Neurologic:   Normal speech and language.  CN 2-10 normal. Motor grossly intact. No gross focal neurologic deficits are appreciated.  Skin:    Skin is warm, dry and intact. No rash noted.  No petechiae, purpura, or bullae.  ____________________________________________    LABS (pertinent positives/negatives) (all labs ordered are listed, but only abnormal results are displayed) Labs Reviewed  BASIC METABOLIC PANEL - Abnormal; Notable for the following:    Glucose, Bld 199 (*)    BUN 32 (*)    Creatinine, Ser 1.63 (*)    Calcium 8.8 (*)    GFR calc non Af Amer 36 (*)    GFR calc Af Amer 42 (*)    All other components within normal limits  CBC - Abnormal; Notable for the following:    RBC 4.05 (*)    Hemoglobin 12.5 (*)    HCT 37.5 (*)    All other components within normal limits  HEPATIC FUNCTION PANEL - Abnormal; Notable for the following:    Albumin 3.3 (*)    Bilirubin, Direct <0.1 (*)    All other components within normal limits  GLUCOSE, CAPILLARY - Abnormal; Notable for the following:    Glucose-Capillary 185 (*)    All  other components within normal limits  GLUCOSE, CAPILLARY - Abnormal; Notable for the following:    Glucose-Capillary 155 (*)    All other components within normal limits  TROPONIN I  LIPASE, BLOOD   ____________________________________________   EKG  Interpreted by me Sinus bradycardia rate of 55, left axis, normal intervals. Poor R-wave progression in anterior precordial leads. Normal ST segments and T waves.  ____________________________________________    RADIOLOGY  CT angiogram of the chest abdomen pelvis reveals cholelithiasis without cholecystitis and sigmoid diverticulitis without perforation or abscess. No aortic dissection or aneurysm. Ultrasound abdomen reveals cholelithiasis with multiple gallstones, no cholecystitis.   ____________________________________________   PROCEDURES   ____________________________________________   INITIAL IMPRESSION / ASSESSMENT AND PLAN / ED COURSE  Pertinent labs & imaging results that were available during my care of the patient were reviewed by me and considered in my medical decision making (see chart for details).  Patient presents with chest abdomen pain concerning for possible dissection. However CT was negative for this but does reveal diverticulitis and cholelithiasis which does correspond to his areas of tenderness. He is given repeat doses of Dilaudid to attempt pain control. Ultrasound of the abdomen was performed to further evaluate for cholecystitis, which was unremarkable other than cholelithiasis.  On reassessment 3:00 PM,  the patient has persistent abdominal tenderness. He now has chills and is shaking with what appears to be Reiger's. I rechecked the temperature myself which was 98.1. Vital signs are otherwise unremarkable, but on room air his oxygen saturation still 85%, which increases into the 90s on 2 L nasal cannula. At this point clinically he is not improving and in fact appears be getting a little bit worse. I'm  unsure how to relate the hypoxia to his diverticulitis. He does not appear to have a surgical abdomen at this time. I discussed the case with the hospitalist for admission. He is given IV Zosyn for the intra-abdominal process.     ____________________________________________   FINAL CLINICAL IMPRESSION(S) / ED DIAGNOSES  Final diagnoses:  Diverticulitis of large intestine without perforation or abscess without bleeding  Acute respiratory failure with hypoxia ([place])  Pain of upper abdomen       Portions of this note were generated with dragon dictation software. Dictation errors may occur despite best attempts at proofreading.   Carrie Mew, MD 03/21/16 [phone]

## 2016-03-21 NOTE — H&P (Addendum)
[place] [facility] [place] NAME: Joseph [name]    MR#:  [id number]  DATE OF BIRTH:  1925/02/27  DATE OF ADMISSION:  03/21/2016  PRIMARY CARE PHYSICIAN: Wilhemena Durie, MD   REQUESTING/REFERRING PHYSICIAN: Dr. Joni Fears  CHIEF COMPLAINT: Pain, abdominal pain    Chief Complaint  Patient presents with  . Chest Pain    HISTORY OF PRESENT ILLNESS:  Joseph [name]  is a 82 y.o. male with a known history of Chronic diastolic heart failure came in because of chest pain radiating down the abdomen and also to the flanks bilaterally. This describes the pain as pressure-like pain mainly in the epigastric area and going down the abdomen. Had some nausea this morning but no vomiting, no diarrhea. No chest pain. Has some cough. Patient is hypoxic on room air with saturations 85% here. Now on 2 L saturations up to 95%. Patient had a CT and U of chest negative for pulmonary emboli, no pneumonia. Abdominal CAT scan showed mild diverticulitis. Patient also has some chills. Patient was seen by cardiology Dr. Darlin Drop  recently,, has been having sob and had history of CABG  so he is scheduled to have a coronary  angiogram on 11 to May. And was supposed to take double dose of torsemide for 3 days at 20 mg twice a day but it was not started because of renal insufficiency. Past Medical History  Diagnosis Date  . CAD (coronary artery disease)     a. s/p CABG;  b. 07/2012 Cath: 3VD including 80 dLCX (med Rx), 4/4 patent grafts.  . Diabetes mellitus, type 2 ([place])   . Hyperlipidemia   . HTN (hypertension)   . Hypothyroidism   . Chronic diastolic CHF (congestive heart failure) ([place])     a. 07/2012 Echo: EF 55-60%, no rwma, Gr 1 DD, mild MR;  04/2014 Echo: EF 55-60%, no rwma, mild MR, mildly dil LA.  Marland Kitchen Lower extremity edema     a. 03/2014 amlodipine d/c'd.    PAST SURGICAL HISTOIRY:   Past Surgical History  Procedure Laterality Date  . Prostate biopsy    .  Hemorrhoid surgery    . Appendectomy    . Toe surgery      ingrown toe nail  . Coronary artery bypass graft  1994  . Cardiac catheterization  07/2012    [facility]/no stents  . Corneal transplant      SOCIAL HISTORY:   Social History  Substance Use Topics  . Smoking status: Former Smoker -- 1.00 packs/day    Types: Cigarettes    Quit date: 12/23/1980  . Smokeless tobacco: Never Used  . Alcohol Use: No    FAMILY HISTORY:   Family History  Problem Relation Age of Onset  . Heart failure Mother   . Heart attack Father   . Heart failure Maternal Uncle   . Heart failure Maternal Grandfather     DRUG ALLERGIES:  No Known Allergies  REVIEW OF SYSTEMS:  CONSTITUTIONAL: No fever, fatigue or weakness.  EYES: No blurred or double vision.  EARS, NOSE, AND THROAT: No tinnitus or ear pain.  RESPIRATORY:has some cough/sob. CARDIOVASCULAR: No chest pain, orthopnea, edema.  GASTROINTESTINAL: Nausea, mild abdominal pain.  GENITOURINARY: No dysuria, hematuria.  ENDOCRINE: No polyuria, nocturia,  HEMATOLOGY: No anemia, easy bruising or bleeding SKIN: No rash or lesion. MUSCULOSKELETAL: No joint pain or arthritis.   NEUROLOGIC: No tingling, numbness, weakness.  PSYCHIATRY: No anxiety or depression.   MEDICATIONS  AT HOME:   Prior to Admission medications   Medication Sig Start Date End Date Taking? Authorizing Provider  Ascorbic Acid (VITAMIN C) 100 MG tablet Take by mouth.   Yes Historical Provider, MD  aspirin EC 81 MG tablet Take 1 tablet (81 mg total) by mouth daily. 03/17/16  Yes Ryan M Dunn, PA-C  atorvastatin (LIPITOR) 40 MG tablet Take 1 tablet (40 mg total) by mouth daily. 10/26/15  Yes Minna Merritts, MD  cholecalciferol (VITAMIN D) 1000 UNITS tablet Take 2,000 Units by mouth daily.   Yes Historical Provider, MD  ciclopirox (PENLAC) 8 % solution    Yes Historical Provider, MD  doxazosin (CARDURA) 4 MG tablet Take 4 mg by mouth 2 (two) times daily.   Yes Historical Provider, MD   hydrALAZINE (APRESOLINE) 25 MG tablet Take 1 tablet (25 mg total) by mouth 2 (two) times daily. 07/28/15  Yes Richard Maceo Pro., MD  insulin aspart (NOVOLOG) 100 UNIT/ML injection Inject into the skin 3 (three) times daily before meals. 26-16-28   Yes Historical Provider, MD  insulin glargine (LANTUS) 100 UNIT/ML injection Inject 70 Units into the skin at bedtime.    Yes Historical Provider, MD  levothyroxine (SYNTHROID, LEVOTHROID) 50 MCG tablet Take 50 mcg by mouth daily before breakfast.   Yes Historical Provider, MD  lisinopril (PRINIVIL,ZESTRIL) 40 MG tablet Take 40 mg by mouth daily.   Yes Historical Provider, MD  Magnesium 250 MG TABS Take by mouth.   Yes Historical Provider, MD  nitroGLYCERIN (NITROSTAT) 0.4 MG SL tablet Place 1 tablet (0.4 mg total) under the tongue every 5 (five) minutes as needed for chest pain. 04/29/14  Yes Rogelia Mire, NP  omeprazole (PRILOSEC) 20 MG capsule Take 20 mg by mouth daily.   Yes Historical Provider, MD  potassium chloride (K-DUR) 10 MEQ tablet Take 20 mEq by mouth daily.   Yes Historical Provider, MD  torsemide (DEMADEX) 20 MG tablet Take 1 tablet (20 mg total) by mouth daily as needed. 08/12/15  Yes Richard Maceo Pro., MD      VITAL SIGNS:  Blood pressure 132/46, pulse 88, temperature 97.7 F (36.5 C), temperature source Oral, resp. rate 20, height 5\' 7"  (1.702 m), weight 102.059 kg (225 lb), SpO2 92 %.  PHYSICAL EXAMINATION:  GENERAL:  82 y.o.-year-old patient lying in the bed with no acute distress. Obese  Male not in distress,  EYES; Pupils equal, round, reactive to light and accommodation. No scleral icterus. Extraocular muscles intact.  HEENT: Head atraumatic, normocephalic. Oropharynx and nasopharynx clear.  NECK:  Supple, no jugular venous distention. No thyroid enlargement, no tenderness.  LUNGS: Normal breath sounds bilaterally, no wheezing, rales,rhonchi or crepitation. No use of accessory muscles of respiration.   CARDIOVASCULAR: S1, S2 normal. No murmurs, rubs, or gallops.  ABDOMEN: Soft, nontender, nondistended. Bowel sounds present. No organomegaly or mass.  EXTREMITIES: No pedal edema, cyanosis, or clubbing.  NEUROLOGIC: Cranial nerves II through XII are intact. Muscle strength 5/5 in all extremities. Sensation intact. Gait not checked.  PSYCHIATRIC: The patient is alert and oriented x 3.  SKIN: No obvious rash, lesion, or ulcer.   LABORATORY PANEL:   CBC  Recent Labs Lab 03/21/16 0730  WBC 7.4  HGB 12.5*  HCT 37.5*  PLT 197   ------------------------------------------------------------------------------------------------------------------  Chemistries   Recent Labs Lab 03/21/16 0730  NA 139  K 4.5  CL 105  CO2 26  GLUCOSE 199*  BUN 32*  CREATININE 1.63*  CALCIUM 8.8*  AST 38  ALT 25  ALKPHOS 43  BILITOT 0.4   ------------------------------------------------------------------------------------------------------------------  Cardiac Enzymes  Recent Labs Lab 03/21/16 0730  TROPONINI <0.03   ------------------------------------------------------------------------------------------------------------------  RADIOLOGY:  Dg Chest 2 View  03/21/2016  CLINICAL DATA:  Chest pain radiating into the abdomen and both flanks beginning 2 hours ago. Initial encounter. EXAM: CHEST  2 VIEW COMPARISON:  PA and lateral chest 03/18/2016 and 06/20/2012. FINDINGS: The lungs are clear. Heart size is normal. The patient is status post CABG. No pneumothorax or pleural effusion. Spondylosis is noted. IMPRESSION: No acute disease. Electronically Signed   By: Inge Rise M.D.   On: 03/21/2016 07:24   Ct Angio Chest Aorta W/cm &/or Wo/cm  03/21/2016  CLINICAL DATA:  Centralized chest pain radiating into the abdomen in both flanks beginning at 4:30 a.m. Chest pressure and burning. No known injury. Initial encounter. EXAM: CT ANGIOGRAPHY CHEST, ABDOMEN AND PELVIS TECHNIQUE: Multidetector CT  imaging through the chest, abdomen and pelvis was performed using the standard protocol during bolus administration of intravenous contrast. Multiplanar reconstructed images and MIPs were obtained and reviewed to evaluate the vascular anatomy. CONTRAST:  100 cc Isovue 370. COMPARISON:  None. FINDINGS: CTA CHEST FINDINGS There is no aortic dissection or aneurysm. No pulmonary embolus is seen. The patient has calcific aortic and coronary atherosclerosis. Postoperative change of CABG is noted. There is no pleural or pericardial effusion. No axillary, hilar or mediastinal lymphadenopathy. The lungs demonstrate some dependent atelectasis. No focal bony abnormality is identified. Review of the MIP images confirms the above findings. CTA ABDOMEN AND PELVIS FINDINGS No aortic dissection or aneurysm is identified with extensive calcific atherosclerotic vascular disease. No significant branch vessel stenosis is seen. Multiple small stones are seen layering dependently in the gallbladder. No evidence of cholecystitis is identified. The liver, kidneys, spleen, adrenal glands and pancreas appear normal. There is sigmoid diverticular disease with mild stranding seen about the mid sigmoid colon. The colon is otherwise unremarkable. The stomach, small bowel and appendix appear normal. Small fat containing umbilical hernia is noted. No focal bony abnormalities identified. Review of the MIP images confirms the above findings. IMPRESSION: Negative for aortic dissection or aneurysm. Sigmoid diverticulosis with stranding about the mid sigmoid colon compatible with mild diverticulitis without abscess or perforation. Extensive atherosclerotic vascular disease without aneurysm. The patient is status post CABG. Gallstones without evidence of cholecystitis. Small fat containing umbilical hernia. Electronically Signed   By: Inge Rise M.D.   On: 03/21/2016 09:54   Ct Cta Abd/pel W/cm &/or W/o Cm  03/21/2016  CLINICAL DATA:   Centralized chest pain radiating into the abdomen in both flanks beginning at 4:30 a.m. Chest pressure and burning. No known injury. Initial encounter. EXAM: CT ANGIOGRAPHY CHEST, ABDOMEN AND PELVIS TECHNIQUE: Multidetector CT imaging through the chest, abdomen and pelvis was performed using the standard protocol during bolus administration of intravenous contrast. Multiplanar reconstructed images and MIPs were obtained and reviewed to evaluate the vascular anatomy. CONTRAST:  100 cc Isovue 370. COMPARISON:  None. FINDINGS: CTA CHEST FINDINGS There is no aortic dissection or aneurysm. No pulmonary embolus is seen. The patient has calcific aortic and coronary atherosclerosis. Postoperative change of CABG is noted. There is no pleural or pericardial effusion. No axillary, hilar or mediastinal lymphadenopathy. The lungs demonstrate some dependent atelectasis. No focal bony abnormality is identified. Review of the MIP images confirms the above findings. CTA ABDOMEN AND PELVIS FINDINGS No aortic dissection or aneurysm is identified with extensive calcific atherosclerotic vascular disease. No  significant branch vessel stenosis is seen. Multiple small stones are seen layering dependently in the gallbladder. No evidence of cholecystitis is identified. The liver, kidneys, spleen, adrenal glands and pancreas appear normal. There is sigmoid diverticular disease with mild stranding seen about the mid sigmoid colon. The colon is otherwise unremarkable. The stomach, small bowel and appendix appear normal. Small fat containing umbilical hernia is noted. No focal bony abnormalities identified. Review of the MIP images confirms the above findings. IMPRESSION: Negative for aortic dissection or aneurysm. Sigmoid diverticulosis with stranding about the mid sigmoid colon compatible with mild diverticulitis without abscess or perforation. Extensive atherosclerotic vascular disease without aneurysm. The patient is status post CABG.  Gallstones without evidence of cholecystitis. Small fat containing umbilical hernia. Electronically Signed   By: Inge Rise M.D.   On: 03/21/2016 09:54   US Abdomen Limited Ruq  03/21/2016  CLINICAL DATA:  Right upper quadrant tenderness for 1 day. EXAM: US ABDOMEN LIMITED - RIGHT UPPER QUADRANT COMPARISON:  None. FINDINGS: Gallbladder: Innumerable small stones are identified measuring up to 0.6 cm. There is no gallbladder wall thickening or pericholecystic fluid. Sonographer reports negative Murphy's sign. Common bile duct: Diameter: 0.4 cm. Liver: No focal lesion identified. Within normal limits in parenchymal echogenicity. IMPRESSION: Multiple small gallstones without evidence of cholecystitis. Electronically Signed   By: Inge Rise M.D.   On: 03/21/2016 13:35    EKG:   Orders placed or performed during the hospital encounter of 03/21/16  . EKG 12-Lead  . EKG 12-Lead  . ED EKG within 10 minutes  . ED EKG within 10 minutes  Sinus bradycardia 55 bpm  IMPRESSION AND PLAN:   #1 abdominal pain likely secondary to mild sigmoid diverticulitis: Started on Cipro, Flagyl. No white count. Monitor closely and de-escalate antibiotics. #2, shortness of breath, generalized fatigue, malaise; seen by cardiology and according to them patient is scheduled to have angiogram this month and concern for angina equivalent./Patient is on diuretics, KCl   Continue aspirin, Imdur, start the patient on Coreg, discontinue Norvasc, continue Lipitor. #4 chronic diastolic heart failure: Stable at this time. Consult cardiology with Dr. Esmond Plants, #5. acute respiratory failure with hypoxia likely secondary to obesity and hypoventilation patient has no pneumonia or CHF, no wheezing also on the examination. We will use the incentive spirometry, continue the oxygen for now to keep sats more than 90%. #6. Insulin Dependent diabetes mellitus: Continue the patient on Levemir, start the patient on insulin sliding scale  coverage as well.  7 acute on chronic renal failure::monitor kidney function closley while on diuretics.  All the records are reviewed and case discussed with ED provider. Management plans discussed with the patient, family and they are in agreement.  CODE STATUS:full  TOTAL TIME TAKING CARE OF THIS PATIENT: 77minutes.    Epifanio Lesches M.D on 03/21/2016 at 3:54 PM  Between 7am to 6pm - Pager - [phone]  After 6pm go to [email or web address] - password [email or web address] [facility]  Office  [phone]  CC: Primary care physician; Wilhemena Durie, MD  Note: This dictation was prepared with Dragon dictation along with smaller phrase technology. Any transcriptional errors that result from this process are unintentional.

## 2016-03-22 ENCOUNTER — Inpatient Hospital Stay (HOSPITAL_COMMUNITY)
Admit: 2016-03-22 | Discharge: 2016-03-22 | Disposition: A | Discharge status: Home / Self Care | Payer: PPO | Attending: Physician Assistant | Admitting: Physician Assistant

## 2016-03-22 ENCOUNTER — Inpatient Hospital Stay: Payer: PPO

## 2016-03-22 ENCOUNTER — Telehealth: Payer: Self-pay | Admitting: Family Medicine

## 2016-03-22 DIAGNOSIS — K5732 Diverticulitis of large intestine without perforation or abscess without bleeding: Secondary | ICD-10-CM | POA: Insufficient documentation

## 2016-03-22 DIAGNOSIS — R1010 Upper abdominal pain, unspecified: Secondary | ICD-10-CM | POA: Insufficient documentation

## 2016-03-22 DIAGNOSIS — R0602 Shortness of breath: Secondary | ICD-10-CM | POA: Insufficient documentation

## 2016-03-22 DIAGNOSIS — Z951 Presence of aortocoronary bypass graft: Secondary | ICD-10-CM | POA: Insufficient documentation

## 2016-03-22 DIAGNOSIS — R0600 Dyspnea, unspecified: Secondary | ICD-10-CM

## 2016-03-22 LAB — BASIC METABOLIC PANEL
Anion gap: 8 (ref 5–15)
BUN: 33 mg/dL — AB (ref 6–20)
CALCIUM: 8.6 mg/dL — AB (ref 8.9–10.3)
CO2: 28 mmol/L (ref 22–32)
CREATININE: 1.6 mg/dL — AB (ref 0.61–1.24)
Chloride: 105 mmol/L (ref 101–111)
GFR calc non Af Amer: 37 mL/min — ABNORMAL LOW (ref >60)
GFR, EST AFRICAN AMERICAN: 43 mL/min — AB (ref >60)
GLUCOSE: 106 mg/dL — AB (ref 65–99)
Potassium: 5 mmol/L (ref 3.5–5.1)
Sodium: 141 mmol/L (ref 135–145)

## 2016-03-22 LAB — ECHOCARDIOGRAM COMPLETE
Height: 67 in
Weight: 3651.2 oz

## 2016-03-22 LAB — GLUCOSE, CAPILLARY
GLUCOSE-CAPILLARY: 90 mg/dL (ref 65–99)
GLUCOSE-CAPILLARY: 140 mg/dL — AB (ref 65–99)
GLUCOSE-CAPILLARY: 180 mg/dL — AB (ref 65–99)
GLUCOSE-CAPILLARY: 173 mg/dL — AB (ref 65–99)

## 2016-03-22 LAB — CBC
HCT: 34.7 % — ABNORMAL LOW (ref 40.0–52.0)
Hemoglobin: 11.5 g/dL — ABNORMAL LOW (ref 13.0–18.0)
MCH: 30.3 pg (ref 26.0–34.0)
MCHC: 33.1 g/dL (ref 32.0–36.0)
MCV: 91.5 fL (ref 80.0–100.0)
PLATELETS: 187 10*3/uL (ref 150–440)
RBC: 3.79 MIL/uL — ABNORMAL LOW (ref 4.40–5.90)
RDW: 14.2 % (ref 11.5–14.5)
WBC: 15.3 10*3/uL — ABNORMAL HIGH (ref 3.8–10.6)

## 2016-03-22 MED ORDER — METRONIDAZOLE 500 MG PO TABS: 500.0000 mg | ORAL_TABLET | Freq: Three times a day (TID) | ORAL | Status: DC

## 2016-03-22 MED ORDER — POLYETHYLENE GLYCOL 3350 17 G PO PACK
17.0000 g | PACK | Freq: Every day | ORAL | Status: DC
  Administered 2016-03-22 – 2016-03-26 (×4): 17 g via ORAL
  Filled 2016-03-22 (×5): qty 1

## 2016-03-22 MED ORDER — INSULIN ASPART 100 UNIT/ML ~~LOC~~ SOLN
0.0000 [IU] | Freq: Three times a day (TID) | SUBCUTANEOUS | Status: DC
  Administered 2016-03-22: 1 [IU] via SUBCUTANEOUS
  Administered 2016-03-22: 2 [IU] via SUBCUTANEOUS
  Administered 2016-03-23: 3 [IU] via SUBCUTANEOUS
  Administered 2016-03-23 – 2016-03-25 (×6): 1 [IU] via SUBCUTANEOUS
  Administered 2016-03-25: 5 [IU] via SUBCUTANEOUS
  Administered 2016-03-25: 3 [IU] via SUBCUTANEOUS
  Administered 2016-03-26: 5 [IU] via SUBCUTANEOUS
  Administered 2016-03-26: 1 [IU] via SUBCUTANEOUS
  Administered 2016-03-26: 3 [IU] via SUBCUTANEOUS
  Administered 2016-03-27: 2 [IU] via SUBCUTANEOUS
  Administered 2016-03-27: 5 [IU] via SUBCUTANEOUS
  Administered 2016-03-28: 1 [IU] via SUBCUTANEOUS
  Filled 2016-03-22 (×2): qty 5
  Filled 2016-03-22: qty 1
  Filled 2016-03-22: qty 2
  Filled 2016-03-22 (×2): qty 1
  Filled 2016-03-22: qty 5
  Filled 2016-03-22: qty 1
  Filled 2016-03-22: qty 3
  Filled 2016-03-22: qty 1
  Filled 2016-03-22: qty 3
  Filled 2016-03-22: qty 1
  Filled 2016-03-22: qty 2
  Filled 2016-03-22: qty 3
  Filled 2016-03-22: qty 1
  Filled 2016-03-22: qty 2

## 2016-03-22 MED ORDER — CIPROFLOXACIN HCL 500 MG PO TABS: 500.0000 mg | ORAL_TABLET | Freq: Two times a day (BID) | ORAL | Status: DC

## 2016-03-22 MED ORDER — INSULIN ASPART 100 UNIT/ML ~~LOC~~ SOLN
0.0000 [IU] | Freq: Every day | SUBCUTANEOUS | Status: DC
  Administered 2016-03-24 – 2016-03-27 (×3): 2 [IU] via SUBCUTANEOUS
  Filled 2016-03-22 (×3): qty 2

## 2016-03-22 MED ORDER — CARVEDILOL 3.125 MG PO TABS: 3.1250 mg | ORAL_TABLET | Freq: Two times a day (BID) | ORAL | Status: DC

## 2016-03-22 NOTE — Telephone Encounter (Signed)
Please call tomorrow for transition care call.

## 2016-03-22 NOTE — Progress Notes (Signed)
SATURATION QUALIFICATIONS: (This note is used to comply with regulatory documentation for home oxygen)  Patient Saturations on Room Air at Rest = 95%  Patient Saturations on Room Air while Ambulating = 93%  Patient Saturations on 0 Liters of oxygen while Ambulating = 93%  Please briefly explain why patient needs home oxygen: 

## 2016-03-22 NOTE — Care Management Note (Signed)
Case Management Note  Patient Details  Name: Joseph [name] MRN: [id number] Date of Birth: 11-12-1925  Subjective/Objective:   CM assessment for discharge planning. Met with patient at bedside. He is agreeable to home health with no agency preference. Spoke with wife by phone and she is also agreeable with no agency preference.  [facility]erral to [facility] for [facility]. Wife requesting a walker with a seat. Ordered for delivery to room from [facility]. He lives at home with his wife and at baseline does not use a cane or walker. PCP is Dr. Rosanna Randy.                 Action/Plan: [facility] for Sn and PT. Rolling walker  Expected Discharge Date:   03/22/2016               Expected Discharge Plan:  Joseph [name]  In-House Referral:     Discharge planning Services  CM Consult  Post Acute Care Choice:  Home Health Choice offered to:  Patient, Spouse  DME Arranged:  Walker rolling with seat DME Agency:  Joseph [name] Arranged:  RN, PT [facility] Agency:  Joseph [name]  Status of Service:  Completed, signed off  Medicare Important Message Given:    Date Medicare IM Given:    Medicare IM give by:    Date Additional Medicare IM Given:    Additional Medicare Important Message give by:     If discussed at [place] of Stay Meetings, dates discussed:    Additional Comments:  Joseph Mango, RN 03/22/2016, 9:46 AM

## 2016-03-22 NOTE — Progress Notes (Signed)
Pt remains alert and oriented, complaints of right abd. Pain, controlled with prn pain medication. Pt kept another night 2/2 to complaints of pain, possible discharge tomorrow.

## 2016-03-22 NOTE — Discharge Summary (Deleted)
[facility] NAME: Joseph [name]    MR#:  [id number]  DATE OF BIRTH:  10/29/1925  DATE OF ADMISSION:  03/21/2016 ADMITTING PHYSICIAN: Epifanio Lesches, MD  DATE OF DISCHARGE: 03/22/2016  PRIMARY CARE PHYSICIAN: Wilhemena Durie, MD    ADMISSION DIAGNOSIS:  Acute respiratory failure with hypoxia ([place]) [J96.01] Diverticulitis of large intestine without perforation or abscess without bleeding [K57.32] Pain of upper abdomen [R10.10]  DISCHARGE DIAGNOSIS:  Active Problems:   Acute respiratory failure with hypoxia (H[facility])   Diverticulitis of large intestine without perforation or abscess without bleeding   Pain of upper abdomen   SOB (shortness of breath)   S/P CABG (coronary artery bypass graft)   Coronary artery disease involving coronary bypass graft of native heart with unspecified angina pectoris   Morbid obesity due to excess calories ([place])   SECONDARY DIAGNOSIS:   Past Medical History  Diagnosis Date  . CAD (coronary artery disease)     a. s/p CABG;  b. 07/2012 Cath: 3VD including 80 dLCX (med Rx), 4/4 patent grafts.  . Diabetes mellitus, type 2 ([place])   . Hyperlipidemia   . HTN (hypertension)   . Hypothyroidism   . Chronic diastolic CHF (congestive heart failure) ([place])     a. 07/2012 Echo: EF 55-60%, no rwma, Gr 1 DD, mild MR;  04/2014 Echo: EF 55-60%, no rwma, mild MR, mildly dil LA.  Marland Kitchen Lower extremity edema     a. 03/2014 amlodipine d/c'd.    HOSPITAL COURSE:   82 year old male with a history of CAD/4 vessel CABG, diastolic heart failure and hyperlipidemia who presented with chest pain and abdominal pain and found to have diverticulitis. For further details please refer the H&P.  1. Acute sigmoid diverticulitis: Patient was treated with ciprofloxacin and Flagyl. Blood cell count was elevated however this is likely leukemoid reaction from renal failure/dehydration. Patient was afebrile. Abdominal pain and  improved. Patient will continue treatment for total 4 days.  2. Acute respiratory distress with hypoxia: Repeat chest x-ray showed no evidence of pneumonia or CHF. Oxygen has been weaned off.  3. CAD status post CABG: EKG was negative. Patient will continue aspirin, Coreg and Lipitor. I's and approval and torsemide were discontinued due to acute kidney injury. Next line patient was seen and evaluated by cardiology. Patient will follow-up with cardiology with planned cardiac catheterization in early May.  4. Chronic diastolic heart failure: Torsemide has been discontinued due to acute kidney injury. Continue Coreg. Echocardiogram will be followed up as an outpatient by cardiology.  5. Acute on chronic kidney disease stage II: Creatinine has improved with gentle fluid and holding nephrotoxic agents. Next line patient's torsemide and lisinopril have been discontinued for now. Patient will follow-up with cardiology for repeat BMP.  6. Diabetes: Patient will continue outpatient regimen and follow up with his PCP. DISCHARGE CONDITIONS AND DIET:  Patient is stable for discharge on diabetic diet  CONSULTS OBTAINED:  Treatment Team:  Minna Merritts, MD  DRUG ALLERGIES:  No Known Allergies  DISCHARGE MEDICATIONS:   Current Discharge Medication List    START taking these medications   Details  carvedilol (COREG) 3.125 MG tablet Take 1 tablet (3.125 mg total) by mouth 2 (two) times daily with a meal. Qty: 60 tablet, Refills: 0    ciprofloxacin (CIPRO) 500 MG tablet Take 1 tablet (500 mg total) by mouth 2 (two) times daily. Qty: 20 tablet, Refills: 0    metroNIDAZOLE (FLAGYL) 500 MG  tablet Take 1 tablet (500 mg total) by mouth 3 (three) times daily. Qty: 30 tablet, Refills: 0      CONTINUE these medications which have NOT CHANGED   Details  Ascorbic Acid (VITAMIN C) 100 MG tablet Take by mouth.    aspirin EC 81 MG tablet Take 1 tablet (81 mg total) by mouth daily. Qty: 90  tablet, Refills: 3    atorvastatin (LIPITOR) 40 MG tablet Take 1 tablet (40 mg total) by mouth daily. Qty: 90 tablet, Refills: 3    cholecalciferol (VITAMIN D) 1000 UNITS tablet Take 2,000 Units by mouth daily.    ciclopirox (PENLAC) 8 % solution     doxazosin (CARDURA) 4 MG tablet Take 4 mg by mouth 2 (two) times daily.    hydrALAZINE (APRESOLINE) 25 MG tablet Take 1 tablet (25 mg total) by mouth 2 (two) times daily. Qty: 60 tablet, Refills: 12   Associated Diagnoses: Essential hypertension, benign    insulin aspart (NOVOLOG) 100 UNIT/ML injection Inject into the skin 3 (three) times daily before meals. 26-16-28    insulin glargine (LANTUS) 100 UNIT/ML injection Inject 70 Units into the skin at bedtime.     levothyroxine (SYNTHROID, LEVOTHROID) 50 MCG tablet Take 50 mcg by mouth daily before breakfast.    Magnesium 250 MG TABS Take by mouth.    nitroGLYCERIN (NITROSTAT) 0.4 MG SL tablet Place 1 tablet (0.4 mg total) under the tongue every 5 (five) minutes as needed for chest pain. Qty: 25 tablet, Refills: 3    omeprazole (PRILOSEC) 20 MG capsule Take 20 mg by mouth daily.      STOP taking these medications     lisinopril (PRINIVIL,ZESTRIL) 40 MG tablet      potassium chloride (K-DUR) 10 MEQ tablet      torsemide (DEMADEX) 20 MG tablet               Today   CHIEF COMPLAINT:  Doing well this point. No abdominal pain. No fevers overnight. Patient wants to go home. Patient denies chest pain or shortness of breath.   VITAL SIGNS:  Blood pressure 127/62, pulse 75, temperature 99.2 F (37.3 C), temperature source Oral, resp. rate 20, height 5\' 7"  (1.702 m), weight 103.511 kg (228 lb 3.2 oz), SpO2 94 %.   REVIEW OF SYSTEMS:  Review of Systems  Constitutional: Negative for fever, chills and malaise/fatigue.  HENT: Negative for ear discharge, ear pain, hearing loss, nosebleeds and sore throat.   Eyes: Negative for blurred vision and pain.  Respiratory: Negative  for cough, hemoptysis, shortness of breath and wheezing.   Cardiovascular: Negative for chest pain, palpitations and leg swelling.  Gastrointestinal: Negative for nausea, vomiting, abdominal pain, diarrhea and blood in stool.  Genitourinary: Negative for dysuria.  Musculoskeletal: Negative for back pain.  Neurological: Negative for dizziness, tremors, speech change, focal weakness, seizures and headaches.  Endo/Heme/Allergies: Does not bruise/bleed easily.  Psychiatric/Behavioral: Negative for depression, suicidal ideas and hallucinations.     PHYSICAL EXAMINATION:  GENERAL:  82 y.o.-year-old patient lying in the bed with no acute distress.  NECK:  Supple, no jugular venous distention. No thyroid enlargement, no tenderness.  LUNGS: Normal breath sounds bilaterally, no wheezing, rales,rhonchi  No use of accessory muscles of respiration.  CARDIOVASCULAR: S1, S2 normal. No murmurs, rubs, or gallops.  ABDOMEN: Soft, non-tender, non-distended. Bowel sounds present. No organomegaly or mass.  EXTREMITIES: No pedal edema, cyanosis, or clubbing.  PSYCHIATRIC: The patient is alert and oriented x 3.  SKIN: No obvious  rash, lesion, or ulcer.   DATA REVIEW:   CBC  Recent Labs Lab 03/22/16 0426  WBC 15.3*  HGB 11.5*  HCT 34.7*  PLT 187    Chemistries   Recent Labs Lab 03/21/16 0730 03/22/16 0426  NA 139 141  K 4.5 5.0  CL 105 105  CO2 26 28  GLUCOSE 199* 106*  BUN 32* 33*  CREATININE 1.63* 1.60*  CALCIUM 8.8* 8.6*  AST 38  --   ALT 25  --   ALKPHOS 43  --   BILITOT 0.4  --     Cardiac Enzymes  Recent Labs Lab 03/21/16 0730  TROPONINI <0.03    Microbiology Results  @[email or web address] @  RADIOLOGY:  Dg Chest 1 View  03/22/2016  CLINICAL DATA:  Chronic diastolic heart failure EXAM: CHEST 1 VIEW COMPARISON:  03/21/2016 FINDINGS: Previous median sternotomy and CABG procedure. Mild cardiac enlargement. No pleural effusion or edema. The visualized osseous structures appear  intact. IMPRESSION: 1. No evidence for CHF. Electronically Signed   By: Kerby Moors M.D.   On: 03/22/2016 09:06   Dg Chest 2 View  03/21/2016  CLINICAL DATA:  Chest pain radiating into the abdomen and both flanks beginning 2 hours ago. Initial encounter. EXAM: CHEST  2 VIEW COMPARISON:  PA and lateral chest 03/18/2016 and 06/20/2012. FINDINGS: The lungs are clear. Heart size is normal. The patient is status post CABG. No pneumothorax or pleural effusion. Spondylosis is noted. IMPRESSION: No acute disease. Electronically Signed   By: Inge Rise M.D.   On: 03/21/2016 07:24   Ct Angio Chest Aorta W/cm &/or Wo/cm  03/21/2016  CLINICAL DATA:  Centralized chest pain radiating into the abdomen in both flanks beginning at 4:30 a.m. Chest pressure and burning. No known injury. Initial encounter. EXAM: CT ANGIOGRAPHY CHEST, ABDOMEN AND PELVIS TECHNIQUE: Multidetector CT imaging through the chest, abdomen and pelvis was performed using the standard protocol during bolus administration of intravenous contrast. Multiplanar reconstructed images and MIPs were obtained and reviewed to evaluate the vascular anatomy. CONTRAST:  100 cc Isovue 370. COMPARISON:  None. FINDINGS: CTA CHEST FINDINGS There is no aortic dissection or aneurysm. No pulmonary embolus is seen. The patient has calcific aortic and coronary atherosclerosis. Postoperative change of CABG is noted. There is no pleural or pericardial effusion. No axillary, hilar or mediastinal lymphadenopathy. The lungs demonstrate some dependent atelectasis. No focal bony abnormality is identified. Review of the MIP images confirms the above findings. CTA ABDOMEN AND PELVIS FINDINGS No aortic dissection or aneurysm is identified with extensive calcific atherosclerotic vascular disease. No significant branch vessel stenosis is seen. Multiple small stones are seen layering dependently in the gallbladder. No evidence of cholecystitis is identified. The liver, kidneys,  spleen, adrenal glands and pancreas appear normal. There is sigmoid diverticular disease with mild stranding seen about the mid sigmoid colon. The colon is otherwise unremarkable. The stomach, small bowel and appendix appear normal. Small fat containing umbilical hernia is noted. No focal bony abnormalities identified. Review of the MIP images confirms the above findings. IMPRESSION: Negative for aortic dissection or aneurysm. Sigmoid diverticulosis with stranding about the mid sigmoid colon compatible with mild diverticulitis without abscess or perforation. Extensive atherosclerotic vascular disease without aneurysm. The patient is status post CABG. Gallstones without evidence of cholecystitis. Small fat containing umbilical hernia. Electronically Signed   By: Inge Rise M.D.   On: 03/21/2016 09:54   Ct Cta Abd/pel W/cm &/or W/o Cm  03/21/2016  CLINICAL DATA:  Centralized chest pain radiating  into the abdomen in both flanks beginning at 4:30 a.m. Chest pressure and burning. No known injury. Initial encounter. EXAM: CT ANGIOGRAPHY CHEST, ABDOMEN AND PELVIS TECHNIQUE: Multidetector CT imaging through the chest, abdomen and pelvis was performed using the standard protocol during bolus administration of intravenous contrast. Multiplanar reconstructed images and MIPs were obtained and reviewed to evaluate the vascular anatomy. CONTRAST:  100 cc Isovue 370. COMPARISON:  None. FINDINGS: CTA CHEST FINDINGS There is no aortic dissection or aneurysm. No pulmonary embolus is seen. The patient has calcific aortic and coronary atherosclerosis. Postoperative change of CABG is noted. There is no pleural or pericardial effusion. No axillary, hilar or mediastinal lymphadenopathy. The lungs demonstrate some dependent atelectasis. No focal bony abnormality is identified. Review of the MIP images confirms the above findings. CTA ABDOMEN AND PELVIS FINDINGS No aortic dissection or aneurysm is identified with extensive calcific  atherosclerotic vascular disease. No significant branch vessel stenosis is seen. Multiple small stones are seen layering dependently in the gallbladder. No evidence of cholecystitis is identified. The liver, kidneys, spleen, adrenal glands and pancreas appear normal. There is sigmoid diverticular disease with mild stranding seen about the mid sigmoid colon. The colon is otherwise unremarkable. The stomach, small bowel and appendix appear normal. Small fat containing umbilical hernia is noted. No focal bony abnormalities identified. Review of the MIP images confirms the above findings. IMPRESSION: Negative for aortic dissection or aneurysm. Sigmoid diverticulosis with stranding about the mid sigmoid colon compatible with mild diverticulitis without abscess or perforation. Extensive atherosclerotic vascular disease without aneurysm. The patient is status post CABG. Gallstones without evidence of cholecystitis. Small fat containing umbilical hernia. Electronically Signed   By: Inge Rise M.D.   On: 03/21/2016 09:54   US Abdomen Limited Ruq  03/21/2016  CLINICAL DATA:  Right upper quadrant tenderness for 1 day. EXAM: US ABDOMEN LIMITED - RIGHT UPPER QUADRANT COMPARISON:  None. FINDINGS: Gallbladder: Innumerable small stones are identified measuring up to 0.6 cm. There is no gallbladder wall thickening or pericholecystic fluid. Sonographer reports negative Murphy's sign. Common bile duct: Diameter: 0.4 cm. Liver: No focal lesion identified. Within normal limits in parenchymal echogenicity. IMPRESSION: Multiple small gallstones without evidence of cholecystitis. Electronically Signed   By: Inge Rise M.D.   On: 03/21/2016 13:35      Management plans discussed with the patient and he is in agreement. Stable for discharge home  Patient should follow up with PCP in 3-4 days for CBC and BMP And Dr Rockey Situ  CODE STATUS:     Code Status Orders        Start     Ordered   03/21/16 1542  Full code    Continuous     03/21/16 1547    Code Status History    Date Active Date Inactive Code Status Order ID Comments User Context   This patient has a current code status but no historical code status.    Advance Directive Documentation        Most Recent Value   Type of Advance Directive  Healthcare Power of Attorney   Pre-existing out of facility DNR order (yellow form or pink MOST form)     "MOST" Form in Place?        TOTAL TIME TAKING CARE OF THIS PATIENT: 36 minutes.    Note: This dictation was prepared with Dragon dictation along with smaller phrase technology. Any transcriptional errors that result from this process are unintentional.  Stachia Slutsky M.D on 03/22/2016 at 10:57  AM  Between 7am to 6pm - Pager - [phone] After 6pm go to [email or web address] - password [email or web address]  Office  [phone]  CC: Primary care physician; Wilhemena Durie, MD

## 2016-03-22 NOTE — Telephone Encounter (Signed)
See below-aa 

## 2016-03-22 NOTE — Telephone Encounter (Signed)
Pt is being discharged from [facility] today for Acute respiratory failure .  I have scheduled a follow up appointment/MW

## 2016-03-22 NOTE — Consult Note (Signed)
Cardiology Consultation Note  Patient ID: Joseph [name], MRN: [id number], DOB/AGE: 1925-09-24 82 y.o. Admit date: 03/21/2016   Date of Consult: 03/22/2016 Primary Physician: Wilhemena Durie, MD Primary Cardiologist: Dr. Rockey Situ, MD Requesting Physician: Governor Specking, MD  Chief Complaint: Chest pain Reason for Consult: SOB  HPI: 82 y.o. male with h/o CAD s/p 4 vessel CABG in 1994 per wife, chronic combined CHF, PAD with reported right SFA stenting, HLD, poorly controlled DM2, history of tobacco abuse via pipe smoking for 15-20 years quitting in 1982, arthritis of the knees, history of fatigue, SOB, and generalized weakness, which have been felt to be his anginal equivalent who was recently seen in the office on 4/27 with increased SOB presented to [facility] on 5/1 with chest pain, acute respiratory distress with hypoxia requiring nasal cannula, and acute sigmoid diverticulitis.  Last cardiac catheterization from 08/16/2012, in the setting of progressive SOB and fatigue felt to possibly be his anginal equivalant, showed severe 3 vesel disease with patent grafts x 4. There was 80% disease of a small to moderate sized distal left circumflex. Ejection fraction 45-50%. No significant aortic valve stenosis or mitral valve regurgitation. The findings were discussed with interventional cardiology and medical management was recommended for the distal left circumflex disease. Echo from 06/2012 showed EF 55-60%, normal WM, GR1DD, mild MR. Patient was seen in June 2015 with progressive DOE. He was changed from HCTZ to Lasix. He diuresed 12 pounds. Echo from 04/2014 showed EF 55-60%, no RWMA, LV diastolic function normal, mild MR, LA mildly dilated, PASP normal. Despite the above diuresis and echo he continued to be SOB. It was suspected this may be his anginal equalivent, however the patient did not want to move forward with cardiac cath at that time. Imdur was added. In subsequent follow ups he was doing well with a  goal weight of 225 pounds.  He was seen 03/17/16 with complaints of over the past several months he has been noticing a steady downward decline in his functional capacity including unable to walk > 100 feet without having to stop and take a break, unable to take the trash out, unable to climb stairs without having to stop for breaks. His weight has been approximately stable with him weight 230 pounds. He has stable 3-pillow orthopnea in the setting of GERD per his PCP. Given his decline and SOB he preferred to be evaluated with a cardiac cath, which was scheduled for 5/11.   He presented to [facility] on 5/1 with epigastric pain that radiated into his lower abdomen and bilateral flanks. He also noted some RUQ pain. He denies any chest pain. Some nausea. No exertional symptoms. He was concerned he was having an MI, thus he presented to the ED. No diarrhea.   Upon the patient's arrival to [facility] they were found to have troponin negative x 1, WBC 7.4-->15.3, SCr 1.63-->1.61, lipase 21, albumin 3.3. ECG showed sinus bradycardia with sinus arrhythmia, 55 bpm, left axis, nonspecific IVCD, no acute st/t changes, CXR showed no acute disease. CTA chest PE protocol/CTA abdomen/pelvis was negative for PE, aortic dissection or aneurysm. There was sigmoid diverticulitis with stranding along the mid sigmoid colon c/w mild diverticulitis without abscess or perforation. RUQ ultrasound showed gallstones without cholecystitis. He has been weaned down to 1 L via nasal cannula, though continues to get hypoxia on room air with exertion.     Past Medical History  Diagnosis Date  . CAD (coronary artery disease)     a. s/p  CABG;  b. 07/2012 Cath: 3VD including 80 dLCX (med Rx), 4/4 patent grafts.  . Diabetes mellitus, type 2 ([place])   . Hyperlipidemia   . HTN (hypertension)   . Hypothyroidism   . Chronic diastolic CHF (congestive heart failure) ([place])     a. 07/2012 Echo: EF 55-60%, no rwma, Gr 1 DD, mild MR;  04/2014 Echo: EF 55-60%,  no rwma, mild MR, mildly dil LA.  Marland Kitchen Lower extremity edema     a. 03/2014 amlodipine d/c'd.      Most Recent Cardiac Studies: As above.   Surgical History:  Past Surgical History  Procedure Laterality Date  . Prostate biopsy    . Hemorrhoid surgery    . Appendectomy    . Toe surgery      ingrown toe nail  . Coronary artery bypass graft  1994  . Cardiac catheterization  07/2012    [facility]/no stents  . Corneal transplant       Home Meds: Prior to Admission medications   Medication Sig Start Date End Date Taking? Authorizing Provider  Ascorbic Acid (VITAMIN C) 100 MG tablet Take by mouth.   Yes Historical Provider, MD  aspirin EC 81 MG tablet Take 1 tablet (81 mg total) by mouth daily. 03/17/16  Yes Jaskirat Zertuche M Rolene Andrades, PA-C  atorvastatin (LIPITOR) 40 MG tablet Take 1 tablet (40 mg total) by mouth daily. 10/26/15  Yes Minna Merritts, MD  cholecalciferol (VITAMIN D) 1000 UNITS tablet Take 2,000 Units by mouth daily.   Yes Historical Provider, MD  ciclopirox (PENLAC) 8 % solution    Yes Historical Provider, MD  doxazosin (CARDURA) 4 MG tablet Take 4 mg by mouth 2 (two) times daily.   Yes Historical Provider, MD  hydrALAZINE (APRESOLINE) 25 MG tablet Take 1 tablet (25 mg total) by mouth 2 (two) times daily. 07/28/15  Yes Richard Maceo Pro., MD  insulin aspart (NOVOLOG) 100 UNIT/ML injection Inject into the skin 3 (three) times daily before meals. 26-16-28   Yes Historical Provider, MD  insulin glargine (LANTUS) 100 UNIT/ML injection Inject 70 Units into the skin at bedtime.    Yes Historical Provider, MD  levothyroxine (SYNTHROID, LEVOTHROID) 50 MCG tablet Take 50 mcg by mouth daily before breakfast.   Yes Historical Provider, MD  lisinopril (PRINIVIL,ZESTRIL) 40 MG tablet Take 40 mg by mouth daily.   Yes Historical Provider, MD  Magnesium 250 MG TABS Take by mouth.   Yes Historical Provider, MD  nitroGLYCERIN (NITROSTAT) 0.4 MG SL tablet Place 1 tablet (0.4 mg total) under the tongue every 5  (five) minutes as needed for chest pain. 04/29/14  Yes Rogelia Mire, NP  omeprazole (PRILOSEC) 20 MG capsule Take 20 mg by mouth daily.   Yes Historical Provider, MD  potassium chloride (K-DUR) 10 MEQ tablet Take 20 mEq by mouth daily.   Yes Historical Provider, MD  torsemide (DEMADEX) 20 MG tablet Take 1 tablet (20 mg total) by mouth daily as needed. 08/12/15  Yes Richard Maceo Pro., MD    Inpatient Medications:  . aspirin EC  81 mg Oral Daily  . atorvastatin  40 mg Oral Daily  . carvedilol  3.125 mg Oral BID WC  . cholecalciferol  2,000 Units Oral Daily  . ciprofloxacin  400 mg Intravenous Q12H  . docusate sodium  100 mg Oral BID  . doxazosin  4 mg Oral BID  . hydrALAZINE  25 mg Oral BID  . insulin aspart  0-5 Units Subcutaneous QHS  .  insulin aspart  0-9 Units Subcutaneous TID WC  . insulin glargine  70 Units Subcutaneous QHS  . levothyroxine  50 mcg Oral Q0600  . metronidazole  500 mg Intravenous Q12H  . pantoprazole  40 mg Oral Daily  . potassium chloride SA  20 mEq Oral Daily  . torsemide  20 mg Oral Daily      Allergies: No Known Allergies  Social History   Social History  . Marital Status: Married    Spouse Name: N/A  . Number of Children: N/A  . Years of Education: N/A   Occupational History  . Not on file.   Social History Main Topics  . Smoking status: Former Smoker -- 1.00 packs/day    Types: Cigarettes    Quit date: 12/23/1980  . Smokeless tobacco: Never Used  . Alcohol Use: No  . Drug Use: No  . Sexual Activity: No   Other Topics Concern  . Not on file   Social History Narrative   Retired.      Family History  Problem Relation Age of Onset  . Heart failure Mother   . Heart attack Father   . Heart failure Maternal Uncle   . Heart failure Maternal Grandfather      Review of Systems: Review of Systems  Constitutional: Positive for malaise/fatigue. Negative for fever, chills, weight loss and diaphoresis.  HENT: Negative for  congestion.   Eyes: Negative for discharge and redness.  Respiratory: Positive for cough and shortness of breath. Negative for hemoptysis, sputum production and wheezing.   Cardiovascular: Negative for chest pain, palpitations, orthopnea, claudication, leg swelling and PND.  Gastrointestinal: Positive for abdominal pain. Negative for heartburn, nausea, vomiting, diarrhea and constipation.  Musculoskeletal: Negative for myalgias and falls.  Skin: Negative for rash.  Neurological: Positive for weakness. Negative for dizziness, tingling, tremors, sensory change, speech change, focal weakness and loss of consciousness.  Endo/Heme/Allergies: Does not bruise/bleed easily.  Psychiatric/Behavioral: The patient is not nervous/anxious.   All other systems reviewed and are negative.   Labs:  Recent Labs  03/21/16 0730  TROPONINI <0.03   Lab Results  Component Value Date   WBC 15.3* 03/22/2016   HGB 11.5* 03/22/2016   HCT 34.7* 03/22/2016   MCV 91.5 03/22/2016   PLT 187 03/22/2016    Recent Labs Lab 03/21/16 0730 03/22/16 0426  NA 139 141  K 4.5 5.0  CL 105 105  CO2 26 28  BUN 32* 33*  CREATININE 1.63* 1.60*  CALCIUM 8.8* 8.6*  PROT 7.1  --   BILITOT 0.4  --   ALKPHOS 43  --   ALT 25  --   AST 38  --   GLUCOSE 199* 106*   Lab Results  Component Value Date   CHOL 148 07/06/2015   HDL 36 07/06/2015   LDLCALC 91 07/06/2015   TRIG 191* 07/06/2015   No results found for: DDIMER  Radiology/Studies:  Dg Chest 2 View  03/21/2016  CLINICAL DATA:  Chest pain radiating into the abdomen and both flanks beginning 2 hours ago. Initial encounter. EXAM: CHEST  2 VIEW COMPARISON:  PA and lateral chest 03/18/2016 and 06/20/2012. FINDINGS: The lungs are clear. Heart size is normal. The patient is status post CABG. No pneumothorax or pleural effusion. Spondylosis is noted. IMPRESSION: No acute disease. Electronically Signed   By: Inge Rise M.D.   On: 03/21/2016 07:24   Dg Chest 2  View  03/18/2016  CLINICAL DATA:  Preoperative evaluation for upcoming cardiac catheterization  EXAM: CHEST  2 VIEW COMPARISON:  06/20/2012 FINDINGS: Cardiac shadow is within normal limits. Postsurgical changes are again seen. Lungs are well aerated bilaterally without focal infiltrate or sizable effusion. Degenerative changes of the thoracic spine are noted. IMPRESSION: No active cardiopulmonary disease. Electronically Signed   By: Inez Catalina M.D.   On: 03/18/2016 11:21   Ct Angio Chest Aorta W/cm &/or Wo/cm  03/21/2016  CLINICAL DATA:  Centralized chest pain radiating into the abdomen in both flanks beginning at 4:30 a.m. Chest pressure and burning. No known injury. Initial encounter. EXAM: CT ANGIOGRAPHY CHEST, ABDOMEN AND PELVIS TECHNIQUE: Multidetector CT imaging through the chest, abdomen and pelvis was performed using the standard protocol during bolus administration of intravenous contrast. Multiplanar reconstructed images and MIPs were obtained and reviewed to evaluate the vascular anatomy. CONTRAST:  100 cc Isovue 370. COMPARISON:  None. FINDINGS: CTA CHEST FINDINGS There is no aortic dissection or aneurysm. No pulmonary embolus is seen. The patient has calcific aortic and coronary atherosclerosis. Postoperative change of CABG is noted. There is no pleural or pericardial effusion. No axillary, hilar or mediastinal lymphadenopathy. The lungs demonstrate some dependent atelectasis. No focal bony abnormality is identified. Review of the MIP images confirms the above findings. CTA ABDOMEN AND PELVIS FINDINGS No aortic dissection or aneurysm is identified with extensive calcific atherosclerotic vascular disease. No significant branch vessel stenosis is seen. Multiple small stones are seen layering dependently in the gallbladder. No evidence of cholecystitis is identified. The liver, kidneys, spleen, adrenal glands and pancreas appear normal. There is sigmoid diverticular disease with mild stranding seen  about the mid sigmoid colon. The colon is otherwise unremarkable. The stomach, small bowel and appendix appear normal. Small fat containing umbilical hernia is noted. No focal bony abnormalities identified. Review of the MIP images confirms the above findings. IMPRESSION: Negative for aortic dissection or aneurysm. Sigmoid diverticulosis with stranding about the mid sigmoid colon compatible with mild diverticulitis without abscess or perforation. Extensive atherosclerotic vascular disease without aneurysm. The patient is status post CABG. Gallstones without evidence of cholecystitis. Small fat containing umbilical hernia. Electronically Signed   By: Inge Rise M.D.   On: 03/21/2016 09:54   Ct Cta Abd/pel W/cm &/or W/o Cm  03/21/2016  CLINICAL DATA:  Centralized chest pain radiating into the abdomen in both flanks beginning at 4:30 a.m. Chest pressure and burning. No known injury. Initial encounter. EXAM: CT ANGIOGRAPHY CHEST, ABDOMEN AND PELVIS TECHNIQUE: Multidetector CT imaging through the chest, abdomen and pelvis was performed using the standard protocol during bolus administration of intravenous contrast. Multiplanar reconstructed images and MIPs were obtained and reviewed to evaluate the vascular anatomy. CONTRAST:  100 cc Isovue 370. COMPARISON:  None. FINDINGS: CTA CHEST FINDINGS There is no aortic dissection or aneurysm. No pulmonary embolus is seen. The patient has calcific aortic and coronary atherosclerosis. Postoperative change of CABG is noted. There is no pleural or pericardial effusion. No axillary, hilar or mediastinal lymphadenopathy. The lungs demonstrate some dependent atelectasis. No focal bony abnormality is identified. Review of the MIP images confirms the above findings. CTA ABDOMEN AND PELVIS FINDINGS No aortic dissection or aneurysm is identified with extensive calcific atherosclerotic vascular disease. No significant branch vessel stenosis is seen. Multiple small stones are seen  layering dependently in the gallbladder. No evidence of cholecystitis is identified. The liver, kidneys, spleen, adrenal glands and pancreas appear normal. There is sigmoid diverticular disease with mild stranding seen about the mid sigmoid colon. The colon is otherwise unremarkable. The stomach, small  bowel and appendix appear normal. Small fat containing umbilical hernia is noted. No focal bony abnormalities identified. Review of the MIP images confirms the above findings. IMPRESSION: Negative for aortic dissection or aneurysm. Sigmoid diverticulosis with stranding about the mid sigmoid colon compatible with mild diverticulitis without abscess or perforation. Extensive atherosclerotic vascular disease without aneurysm. The patient is status post CABG. Gallstones without evidence of cholecystitis. Small fat containing umbilical hernia. Electronically Signed   By: Inge Rise M.D.   On: 03/21/2016 09:54   US Abdomen Limited Ruq  03/21/2016  CLINICAL DATA:  Right upper quadrant tenderness for 1 day. EXAM: US ABDOMEN LIMITED - RIGHT UPPER QUADRANT COMPARISON:  None. FINDINGS: Gallbladder: Innumerable small stones are identified measuring up to 0.6 cm. There is no gallbladder wall thickening or pericholecystic fluid. Sonographer reports negative Murphy's sign. Common bile duct: Diameter: 0.4 cm. Liver: No focal lesion identified. Within normal limits in parenchymal echogenicity. IMPRESSION: Multiple small gallstones without evidence of cholecystitis. Electronically Signed   By: Inge Rise M.D.   On: 03/21/2016 13:35    EKG: sinus bradycardia with sinus arrhythmia, 55 bpm, left axis, nonspecific IVCD, no acute st/t changes  Weights: Filed Weights   03/21/16 0707 03/21/16 1737 03/22/16 0414  Weight: 225 lb (102.059 kg) 228 lb 4.8 oz (103.556 kg) 228 lb 3.2 oz (103.511 kg)     Physical Exam: Blood pressure 127/62, pulse 75, temperature 99.2 F (37.3 C), temperature source Oral, resp. rate 20,  height 5\' 7"  (1.702 m), weight 228 lb 3.2 oz (103.511 kg), SpO2 96 %. Body mass index is 35.73 kg/(m^2). General: Well developed, well nourished, in no mild distress 2/2 RUQ pain. Head: Normocephalic, atraumatic, sclera non-icteric, no xanthomas, nares are without discharge.  Neck: Negative for carotid bruits. JVD not elevated. Lungs: Decreased breath sounds bilaterally with course breath sounds. Breathing is unlabored on 2 L via nasal cannula. Heart: RRR with S1 S2. No murmurs, rubs, or gallops appreciated. Abdomen: Obese, soft, non-tender, non-distended with normoactive bowel sounds. No hepatomegaly. No rebound/guarding. No obvious abdominal masses. Msk:  Strength and tone appear normal for age. Extremities: No clubbing or cyanosis. No edema.  Distal pedal pulses are 2+ and equal bilaterally. Neuro: Alert and oriented X 3. No facial asymmetry. No focal deficit. Moves all extremities spontaneously. Psych:  Responds to questions appropriately with a normal affect.    Assessment and Plan:   1. Acute respiratory distress with hypoxia: -Repeat CXR is pending for this morning -Oxygen has been weaned to 2 L via nasal cannula, continue to wean as able -CTA negative for PE -Repeat CXR scheduled for today showed no CHF or infiltrate  Possibly in the setting of infection   2. Acute sigmoid diverticulitis: -WBC has trended up to 15,000 this morning -On Cipro/Flagyl per IM -Perhaps this was playing a role in the patient's presenting symptoms/complaints of abdominal pain radiating to the flanks  3. CAD s/p CABG as above: -Patient never with chest pain -Troponin negative -EKG nonacute -Continue aspirin, Coreg, Lipitor -Continue with outpatient cardiac work up  4. Chronic diastolic CHF: -Check echo to evaluate LV systolic function, wall motion, and right-sided pressure -Continue Coreg at this time -Hold torsemide given his acute on CKD as below  5. IDDM: -Per IM  6. Acute on CKD stage  II: -Limit nephrotoxic agents   Signed, Christell Faith, PA-C Pager: [phone] 03/22/2016, 8:29 AM

## 2016-03-22 NOTE — Evaluation (Signed)
Physical Therapy Evaluation Patient Details Name: Joseph [name] MRN: [id number] DOB: [birth year] Today's Date: 03/22/2016   History of Present Illness  Pt admitted for acute respiratory failure with hypoxia. Pt with history of DM, HTN, CAD and has cardiac cath schedule for 03/31/16. Pt with complaints of chest pain upon admission and today complains of abdominal pain, rating 10/10 with movement.   Clinical Impression  Pt is a pleasant 82 year old male who was admitted for acute respiratory failure with hypoxia. Pt slightly drowsy upon entering room secondary to just receiving pain meds. Pt easily aroused and agreeable to participate in PT. Pt performs transfers and ambulation with rw and cga. Safe technique performed with no LOB. O2 sats WNL on room air, slightly complaints of SOB symptoms with exertion. Pt demonstrates deficits with pain/endurance/strength/mobility. Would benefit from skilled PT to address above deficits and promote optimal return to PLOF. Recommend transition to [place] upon discharge from acute hospitalization.       Follow Up Recommendations Home health PT    Equipment Recommendations   (rollater)    Recommendations for Other Services       Precautions / Restrictions Precautions Precautions: Fall Restrictions Weight Bearing Restrictions: No      Mobility  Bed Mobility               General bed mobility comments: not performed as pt received in recliner   Transfers Overall transfer level: Needs assistance Equipment used: Rolling walker (2 wheeled) Transfers: Sit to/from Stand Sit to Stand: Min guard         General transfer comment: Pt required momentum prior to performing transfer. Once standing, pt with good balance holding onto rw. Safe technique performed  Ambulation/Gait Ambulation/Gait assistance: Min guard Ambulation Distance (Feet): 50 Feet Assistive device: Rolling walker (2 wheeled) Gait Pattern/deviations: Step-through pattern    Gait velocity interpretation: at or above normal speed for age/gender General Gait Details: ambulated using rw on room air with reciprocal gait pattern. Safe technique performed. Pt with slight complaints of SOB, however abdominal pain limited further distance. Once seated, O2 sats WNL.   Stairs            Wheelchair Mobility    Modified Rankin (Stroke Patients Only)       Balance Overall balance assessment: Modified Independent                                           Pertinent Vitals/Pain Pain Assessment: 0-10 Pain Score: 10-Worst pain ever Pain Location: abodminal area Pain Descriptors / Indicators: Constant Pain Intervention(s): Limited activity within patient's tolerance;Premedicated before session    Home Living Family/patient expects to be discharged to:: Private residence Living Arrangements: Spouse/significant other Available Help at Discharge: Family Type of Home: House Home Access: Stairs to enter Entrance Stairs-Rails: Right Entrance Stairs-Number of Steps: 2 Home Layout: One level Home Equipment: Cane - single point      Prior Function Level of Independence: Independent         Comments: pt comments he walked short distances prior to fatigue and would sit down     Hand Dominance        Extremity/Trunk Assessment   Upper Extremity Assessment: Overall WFL for tasks assessed           Lower Extremity Assessment: Generalized weakness (grossly 4/5)         Communication  Communication: No difficulties  Cognition Arousal/Alertness: Awake/alert Behavior During Therapy:  (sleepy) Overall Cognitive Status: Within Functional Limits for tasks assessed                      General Comments      Exercises Other Exercises Other Exercises: Seated ther-ex performed x 10 reps on B LE including ankle pumps, LAQ, and hip add squeezes. All ther-ex performed with supervision and cues for correct technique.       Assessment/Plan    PT Assessment Patient needs continued PT services  PT Diagnosis Difficulty walking;Generalized weakness   PT Problem List Decreased strength;Decreased mobility;Cardiopulmonary status limiting activity;Pain  PT Treatment Interventions Gait training;DME instruction;Therapeutic exercise   PT Goals (Current goals can be found in the Care Plan section) Acute Rehab PT Goals Patient Stated Goal: to get stronger PT Goal Formulation: With patient Time For Goal Achievement: 04/05/16 Potential to Achieve Goals: Good    Frequency Min 2X/week   Barriers to discharge        Co-evaluation               End of Session Equipment Utilized During Treatment: Gait belt Activity Tolerance: Patient tolerated treatment well Patient left: in chair;with chair alarm set;with family/visitor present Nurse Communication: Mobility status         Time: [id number] PT Time Calculation (min) (ACUTE ONLY): 18 min   Charges:   PT Evaluation $PT Eval Moderate Complexity: 1 Procedure PT Treatments $Therapeutic Exercise: 8-22 mins   PT G Codes:        Taeya Theall 2016-03-26, 11:54 AM Greggory Stallion, PT, DPT [phone]

## 2016-03-22 NOTE — Progress Notes (Signed)
A&O. Up with one assist, very unsteady. No complaints through the night. Slept well. IV antibiotics given. On O2 at 2L.

## 2016-03-22 NOTE — Progress Notes (Signed)
*  PRELIMINARY RESULTS* Echocardiogram 2D Echocardiogram has been performed.  Joseph [name] 03/22/2016, 2:30 PM

## 2016-03-22 NOTE — Progress Notes (Signed)
Pt is complaining of pain 10/10 in RUQ that goes around to his back. Pt has been treated x2 this shift for pain. MD notified. Orders to discontinue discharge and monitor patient one more day. I will continue to monitor.

## 2016-03-23 ENCOUNTER — Inpatient Hospital Stay: Payer: PPO

## 2016-03-23 DIAGNOSIS — J9601 Acute respiratory failure with hypoxia: Secondary | ICD-10-CM

## 2016-03-23 DIAGNOSIS — R109 Unspecified abdominal pain: Secondary | ICD-10-CM | POA: Insufficient documentation

## 2016-03-23 DIAGNOSIS — K5732 Diverticulitis of large intestine without perforation or abscess without bleeding: Secondary | ICD-10-CM

## 2016-03-23 DIAGNOSIS — I25709 Atherosclerosis of coronary artery bypass graft(s), unspecified, with unspecified angina pectoris: Secondary | ICD-10-CM

## 2016-03-23 DIAGNOSIS — K8000 Calculus of gallbladder with acute cholecystitis without obstruction: Secondary | ICD-10-CM | POA: Insufficient documentation

## 2016-03-23 DIAGNOSIS — R0602 Shortness of breath: Secondary | ICD-10-CM

## 2016-03-23 DIAGNOSIS — R1012 Left upper quadrant pain: Secondary | ICD-10-CM

## 2016-03-23 LAB — COMPREHENSIVE METABOLIC PANEL
ALK PHOS: 47 U/L (ref 38–126)
ALT: 22 U/L (ref 17–63)
AST: 28 U/L (ref 15–41)
Albumin: 2.7 g/dL — ABNORMAL LOW (ref 3.5–5.0)
Anion gap: 8 (ref 5–15)
BUN: 44 mg/dL — ABNORMAL HIGH (ref 6–20)
CALCIUM: 8.3 mg/dL — AB (ref 8.9–10.3)
CHLORIDE: 100 mmol/L — AB (ref 101–111)
CO2: 25 mmol/L (ref 22–32)
Creatinine, Ser: 2.02 mg/dL — ABNORMAL HIGH (ref 0.61–1.24)
GFR calc non Af Amer: 28 mL/min — ABNORMAL LOW (ref >60)
GFR, EST AFRICAN AMERICAN: 32 mL/min — AB (ref >60)
GLUCOSE: 140 mg/dL — AB (ref 65–99)
POTASSIUM: 5.3 mmol/L — AB (ref 3.5–5.1)
SODIUM: 133 mmol/L — AB (ref 135–145)
Total Bilirubin: 1 mg/dL (ref 0.3–1.2)
Total Protein: 5.6 g/dL — ABNORMAL LOW (ref 6.5–8.1)

## 2016-03-23 LAB — CBC
HCT: 32.7 % — ABNORMAL LOW (ref 40.0–52.0)
HEMOGLOBIN: 11.1 g/dL — AB (ref 13.0–18.0)
MCH: 30.8 pg (ref 26.0–34.0)
MCHC: 33.8 g/dL (ref 32.0–36.0)
MCV: 91.2 fL (ref 80.0–100.0)
Platelets: 188 10*3/uL (ref 150–440)
RBC: 3.58 MIL/uL — AB (ref 4.40–5.90)
RDW: 14.2 % (ref 11.5–14.5)
WBC: 25.5 10*3/uL — ABNORMAL HIGH (ref 3.8–10.6)

## 2016-03-23 LAB — GLUCOSE, CAPILLARY
GLUCOSE-CAPILLARY: 149 mg/dL — AB (ref 65–99)
GLUCOSE-CAPILLARY: 140 mg/dL — AB (ref 65–99)
GLUCOSE-CAPILLARY: 156 mg/dL — AB (ref 65–99)
Glucose-Capillary: 203 mg/dL — ABNORMAL HIGH (ref 65–99)

## 2016-03-23 MED ORDER — TECHNETIUM TC 99M MEBROFENIN IV KIT
5.0000 | PACK | Freq: Once | INTRAVENOUS | Status: AC | PRN
  Administered 2016-03-23: 5.34 via INTRAVENOUS

## 2016-03-23 MED ORDER — METRONIDAZOLE IN NACL 5-0.79 MG/ML-% IV SOLN
500.0000 mg | Freq: Three times a day (TID) | INTRAVENOUS | Status: DC
  Administered 2016-03-23: 500 mg via INTRAVENOUS
  Filled 2016-03-23 (×4): qty 100

## 2016-03-23 MED ORDER — MORPHINE SULFATE (PF) 4 MG/ML IV SOLN
4.0000 mg | Freq: Once | INTRAVENOUS | Status: AC
  Administered 2016-03-23: 4 mg via INTRAVENOUS

## 2016-03-23 MED ORDER — ENOXAPARIN SODIUM 40 MG/0.4ML ~~LOC~~ SOLN: 40.0000 mg | SUBCUTANEOUS | Status: DC

## 2016-03-23 MED ORDER — ENOXAPARIN SODIUM 30 MG/0.3ML ~~LOC~~ SOLN
30.0000 mg | SUBCUTANEOUS | Status: DC
  Administered 2016-03-23: 30 mg via SUBCUTANEOUS
  Filled 2016-03-23: qty 0.3

## 2016-03-23 MED ORDER — TECHNETIUM TC 99M MEBROFENIN IV KIT
1.0000 | PACK | Freq: Once | INTRAVENOUS | Status: AC | PRN
  Administered 2016-03-23: 1.25 via INTRAVENOUS

## 2016-03-23 MED ORDER — FUROSEMIDE 10 MG/ML IJ SOLN
INTRAMUSCULAR | Status: AC
  Administered 2016-03-23: 20 mg
  Filled 2016-03-23: qty 2

## 2016-03-23 MED ORDER — PENTAFLUOROPROP-TETRAFLUOROETH EX AERO
INHALATION_SPRAY | CUTANEOUS | Status: DC | PRN
  Administered 2016-03-24: 30 via TOPICAL
  Filled 2016-03-23: qty 103.5

## 2016-03-23 MED ORDER — PIPERACILLIN-TAZOBACTAM 3.375 G IVPB
3.3750 g | Freq: Two times a day (BID) | INTRAVENOUS | Status: DC
  Filled 2016-03-23 (×2): qty 50

## 2016-03-23 MED ORDER — FUROSEMIDE 10 MG/ML IJ SOLN: 20.0000 mg | Freq: Once | INTRAMUSCULAR | Status: AC

## 2016-03-23 MED ORDER — MORPHINE SULFATE (PF) 4 MG/ML IV SOLN
INTRAVENOUS | Status: AC
  Filled 2016-03-23: qty 1

## 2016-03-23 MED ORDER — PIPERACILLIN-TAZOBACTAM 3.375 G IVPB
3.3750 g | Freq: Two times a day (BID) | INTRAVENOUS | Status: DC
Start: 2016-03-23 — End: 2016-03-24
  Administered 2016-03-23 – 2016-03-24 (×2): 3.375 g via INTRAVENOUS
  Filled 2016-03-23 (×3): qty 50

## 2016-03-23 MED ORDER — SODIUM CHLORIDE 0.9 % IV SOLN
INTRAVENOUS | Status: DC
  Administered 2016-03-23: 12:00:00 via INTRAVENOUS

## 2016-03-23 NOTE — Progress Notes (Signed)
[place] at [place] NAME: Joseph [name]    MR#:  [id number]  DATE OF BIRTH:  [birth year]  SUBJECTIVE:  Patient with some abdominal pain  REVIEW OF SYSTEMS:    Review of Systems  Constitutional: Negative for fever, chills and malaise/fatigue.  HENT: Negative for ear discharge, ear pain, hearing loss, nosebleeds and sore throat.   Eyes: Negative for blurred vision and pain.  Respiratory: Negative for cough, hemoptysis, shortness of breath and wheezing.   Cardiovascular: Negative for chest pain, palpitations and leg swelling.  Gastrointestinal: Positive for abdominal pain. Negative for nausea, vomiting, diarrhea and blood in stool.  Genitourinary: Negative for dysuria.  Musculoskeletal: Negative for back pain.  Neurological: Negative for dizziness, tremors, speech change, focal weakness, seizures and headaches.  Endo/Heme/Allergies: Does not bruise/bleed easily.  Psychiatric/Behavioral: Negative for depression, suicidal ideas and hallucinations.    Tolerating Diet:yes      DRUG ALLERGIES:  No Known Allergies  VITALS:  Blood pressure 153/56, pulse 77, temperature 97.8 F (36.6 C), temperature source Oral, resp. rate 20, height 5\' 7"  (1.702 m), weight 104.463 kg (230 lb 4.8 oz), SpO2 91 %.  PHYSICAL EXAMINATION:   Physical Exam  Constitutional: He is oriented to person, place, and time and well-developed, well-nourished, and in no distress. No distress.  HENT:  Head: Normocephalic.  Eyes: No scleral icterus.  Neck: Normal range of motion. Neck supple. No JVD present. No tracheal deviation present.  Cardiovascular: Normal rate, regular rhythm and normal heart sounds.  Exam reveals no gallop and no friction rub.   No murmur heard. Pulmonary/Chest: Effort normal and breath sounds normal. No respiratory distress. He has no wheezes. He has no rales. He exhibits no tenderness.  Abdominal: Soft. Bowel sounds are normal. He exhibits no  distension and no mass. There is tenderness. There is no rebound and no guarding.  Musculoskeletal: Normal range of motion. He exhibits no edema.  Neurological: He is alert and oriented to person, place, and time.  Skin: Skin is warm. No rash noted. No erythema.  Psychiatric: Affect and judgment normal.      LABORATORY PANEL:   CBC  Recent Labs Lab 03/23/16 0824  WBC 25.5*  HGB 11.1*  HCT 32.7*  PLT 188   ------------------------------------------------------------------------------------------------------------------  Chemistries   Recent Labs Lab 03/23/16 0824  NA 133*  K 5.3*  CL 100*  CO2 25  GLUCOSE 140*  BUN 44*  CREATININE 2.02*  CALCIUM 8.3*  AST 28  ALT 22  ALKPHOS 47  BILITOT 1.0   ------------------------------------------------------------------------------------------------------------------  Cardiac Enzymes  Recent Labs Lab 03/21/16 0730  TROPONINI <0.03   ------------------------------------------------------------------------------------------------------------------  RADIOLOGY:  Dg Chest 1 View  03/22/2016  CLINICAL DATA:  Chronic diastolic heart failure EXAM: CHEST 1 VIEW COMPARISON:  03/21/2016 FINDINGS: Previous median sternotomy and CABG procedure. Mild cardiac enlargement. No pleural effusion or edema. The visualized osseous structures appear intact. IMPRESSION: 1. No evidence for CHF. Electronically Signed   By: Kerby Moors M.D.   On: 03/22/2016 09:06   US Abdomen Limited Ruq  03/21/2016  CLINICAL DATA:  Right upper quadrant tenderness for 1 day. EXAM: US ABDOMEN LIMITED - RIGHT UPPER QUADRANT COMPARISON:  None. FINDINGS: Gallbladder: Innumerable small stones are identified measuring up to 0.6 cm. There is no gallbladder wall thickening or pericholecystic fluid. Sonographer reports negative Murphy's sign. Common bile duct: Diameter: 0.4 cm. Liver: No focal lesion identified. Within normal limits in parenchymal echogenicity. IMPRESSION:  Multiple small gallstones without evidence  of cholecystitis. Electronically Signed   By: Inge Rise M.D.   On: 03/21/2016 13:35     ASSESSMENT AND PLAN:   82 year old male with a history of CAD/4 vessel CABG, diastolic heart failure and hyperlipidemia who presented with chest pain and abdominal pain and found to have diverticulitis. For further details please refer the H&P.  1. Acute sigmoid diverticulitis:Continue Cipro and Flagyl.   2. Acute respiratory distress with hypoxia: Repeat chest x-ray showed no evidence of pneumonia or CHF. Oxygen has been weaned off.  3. CAD status post CABG: EKG was negative. Patient will continue aspirin, Coreg and Lipitor.  torsemide is discontinued due to acute kidney injury.  Patient will follow-up with cardiology with planned cardiac catheterization in early May.  4. Chronic diastolic heart failure: Torsemide has been discontinued due to acute kidney injury. Continue Coreg.  5. Acute on chronic kidney disease stage II: Creatinine has improved with gentle fluid and holding nephrotoxic agents.   patient's torsemide and lisinopril have been discontinued for now.  6. Diabetes: Patient will continue outpatient regimen and ADA diet    Management plans discussed with the patient and he is in agreement.  CODE STATUS: FULL   TOTAL TIME TAKING CARE OF THIS PATIENT: 30 minutes.     POSSIBLE D/C 1-2 days, DEPENDING ON CLINICAL CONDITION.   Chabely Norby M.D on 03/22/2016 at 11:46 AM  Between 7am to 6pm - Pager - [phone] After 6pm go to [email or web address] - password [email or web address] [facility]  Office  [phone]  CC: Primary care physician; Wilhemena Durie, MD  Note: This dictation was prepared with Dragon dictation along with smaller phrase technology. Any transcriptional errors that result from this process are unintentional.

## 2016-03-23 NOTE — Progress Notes (Signed)
Notified by Dr. Leonia Reeves that patient has Acute Cholecystitis Dr. Benjie Karvonen notified.

## 2016-03-23 NOTE — Progress Notes (Signed)
ANTIBIOTIC CONSULT NOTE - INITIAL  Pharmacy Consult for Zosyn  Indication: acute sigmoid diverticulitis  No Known Allergies  Patient Measurements: Height: 5\' 7"  (170.2 cm) Weight: 230 lb 4.8 oz (104.463 kg) IBW/kg (Calculated) : 66.1 Adjusted Body Weight:   Vital Signs: Temp: 97.5 F (36.4 C) (05/03 1154) BP: 128/43 mmHg (05/03 1154) Pulse Rate: 68 (05/03 1154) Intake/Output from previous day: 05/02 0701 - 05/03 0700 In: 1640 [P.O.:840; IV Piggyback:800] Out: -  Intake/Output from this shift: Total I/O In: 120 [P.O.:120] Out: -   Labs:  Recent Labs  03/21/16 0730 03/22/16 0426 03/23/16 0824  WBC 7.4 15.3* 25.5*  HGB 12.5* 11.5* 11.1*  PLT 197 187 188  CREATININE 1.63* 1.60* 2.02*   Estimated Creatinine Clearance: 29.1 mL/min (by C-G formula based on Cr of 2.02). No results for input(s): VANCOTROUGH, VANCOPEAK, VANCORANDOM, GENTTROUGH, GENTPEAK, GENTRANDOM, TOBRATROUGH, TOBRAPEAK, TOBRARND, AMIKACINPEAK, AMIKACINTROU, AMIKACIN in the last 72 hours.   Microbiology: No results found for this or any previous visit (from the past 720 hour(s)).  Medical History: Past Medical History  Diagnosis Date  . CAD (coronary artery disease)     a. s/p CABG;  b. 07/2012 Cath: 3VD including 80 dLCX (med Rx), 4/4 patent grafts.  . Diabetes mellitus, type 2 ([place])   . Hyperlipidemia   . HTN (hypertension)   . Hypothyroidism   . Chronic diastolic CHF (congestive heart failure) ([place])     a. 07/2012 Echo: EF 55-60%, no rwma, Gr 1 DD, mild MR;  04/2014 Echo: EF 55-60%, no rwma, mild MR, mildly dil LA.  Marland Kitchen Lower extremity edema     a. 03/2014 amlodipine d/c'd.    Medications:  Prescriptions prior to admission  Medication Sig Dispense Refill Last Dose  . Ascorbic Acid (VITAMIN C) 100 MG tablet Take by mouth.   unknown at unknown  . aspirin EC 81 MG tablet Take 1 tablet (81 mg total) by mouth daily. 90 tablet 3 03/21/2016 at 0600  . atorvastatin (LIPITOR) 40 MG tablet Take 1 tablet (40  mg total) by mouth daily. 90 tablet 3 03/20/2016 at Unknown time  . cholecalciferol (VITAMIN D) 1000 UNITS tablet Take 2,000 Units by mouth daily.   unknown at unknown  . ciclopirox (PENLAC) 8 % solution    prn at prn  . doxazosin (CARDURA) 4 MG tablet Take 4 mg by mouth 2 (two) times daily.   unknown at unknown  . hydrALAZINE (APRESOLINE) 25 MG tablet Take 1 tablet (25 mg total) by mouth 2 (two) times daily. 60 tablet 12 03/20/2016 at Unknown time  . insulin aspart (NOVOLOG) 100 UNIT/ML injection Inject into the skin 3 (three) times daily before meals. 26-16-28   unknown at unknown  . insulin glargine (LANTUS) 100 UNIT/ML injection Inject 70 Units into the skin at bedtime.    unknown at unknown  . levothyroxine (SYNTHROID, LEVOTHROID) 50 MCG tablet Take 50 mcg by mouth daily before breakfast.   unknown at unknown  . lisinopril (PRINIVIL,ZESTRIL) 40 MG tablet Take 40 mg by mouth daily.   03/20/2016 at Unknown time  . Magnesium 250 MG TABS Take by mouth.   unknown at unknown  . nitroGLYCERIN (NITROSTAT) 0.4 MG SL tablet Place 1 tablet (0.4 mg total) under the tongue every 5 (five) minutes as needed for chest pain. 25 tablet 3 prn at prn  . omeprazole (PRILOSEC) 20 MG capsule Take 20 mg by mouth daily.   unknown at unknown  . potassium chloride (K-DUR) 10 MEQ tablet Take 20  mEq by mouth daily.   unknown at unknown  . torsemide (DEMADEX) 20 MG tablet Take 1 tablet (20 mg total) by mouth daily as needed. 30 tablet 12 unknown at unknown   Assessment: CrCl = 29.1 ml/min   Goal of Therapy:  resolution of infection   Plan:  Expected duration 7 days with resolution of temperature and/or normalization of WBC   Zosyn 3.375 gm IV Q12H ordered to start 5/3 @ 17:30.   Olanrewaju Osborn D 03/23/2016,5:30 PM

## 2016-03-23 NOTE — Progress Notes (Signed)
Physical Therapy Treatment Patient Details Name: Joseph [name] MRN: [id number] DOB: [birth year] Today's Date: 03/23/2016    History of Present Illness Pt admitted for acute respiratory failure with hypoxia. Pt with history of DM, HTN, CAD and has cardiac cath schedule for 03/31/16. Pt with complaints of chest pain upon admission and today complains of abdominal pain, rating 10/10 with movement.     PT Comments    Pt is making limited progression towards goals due to abdominal pain. Pt required 2+ mod assist to perform bed mobility due to increase in pain. Pt performed transfer from EOB with RW and min assist. Pt ambulated approx 3 ft with RW and min assist. Ambulation limited by pts pain and O2 sats. Pt performed seated and supine there-ex on B LE. Pts O2 sat taken during there-ex at 88% on room air. O2 sat at 90% after ambulation on room air. Pt stated unable to breathe deeply d/t abdominal pain. Pt demonstrates deficits in strength and mobility. Pt would benefit from further skilled PT to address deficits; recommend pt receive home health PT after discharge from acute hospitalization.   Follow Up Recommendations  Home health PT     Equipment Recommendations       Recommendations for Other Services       Precautions / Restrictions Precautions Precautions: Fall Restrictions Weight Bearing Restrictions: No    Mobility  Bed Mobility Overal bed mobility: Needs Assistance;+2 for physical assistance Bed Mobility: Supine to Sit     Supine to sit: Mod assist;+2 for physical assistance     General bed mobility comments: Pt required 2+ mod assist and use of bed rails for bed mobility. Pt unable to lift trunk w/o assist d/t abdominal pain. Once sitting, pt required assist with scooting to EOB.   Transfers Overall transfer level: Needs assistance Equipment used: Rolling walker (2 wheeled) Transfers: Sit to/from Stand Sit to Stand: Min assist         General transfer comment: Pt  able to transfer from EOB with RW and min assist. Pt required increased time to rise from EOB and upright posture. Pt demonstrated good balance while standing w/RW.   Ambulation/Gait Ambulation/Gait assistance: Min assist Ambulation Distance (Feet): 3 Feet Assistive device: Rolling walker (2 wheeled) Gait Pattern/deviations: Step-to pattern Gait velocity: slow   General Gait Details: Pt able to ambulate using RW and min assist with step-to gait slow gait pattern approx 3 ft. Pt provided cues regarding foot and walker placement. Ambulation limited by pts abdominal pain.    Stairs            Wheelchair Mobility    Modified Rankin (Stroke Patients Only)       Balance                                    Cognition Arousal/Alertness: Awake/alert Behavior During Therapy: WFL for tasks assessed/performed Overall Cognitive Status: Within Functional Limits for tasks assessed                      Exercises Other Exercises Other Exercises: Pt performed supine ther-ex on B LE including ankle pumps, SAQ, and quad sets with no assist. Pt performed 4 SLR on L LE and had to stop d/t abdominal pain. Pt performed seated ther-ex including LAQ and marching with no assist. All ther-ex performed x12 reps.     General Comments  Pertinent Vitals/Pain Pain Assessment: 0-10 Pain Score: 10-Worst pain ever Pain Location: abdominal area Pain Descriptors / Indicators: Constant Pain Intervention(s): Limited activity within patient's tolerance    Home Living                      Prior Function            PT Goals (current goals can now be found in the care plan section) Acute Rehab PT Goals Patient Stated Goal: to get stronger PT Goal Formulation: With patient Time For Goal Achievement: 04/05/16 Potential to Achieve Goals: Good Progress towards PT goals: Not progressing toward goals - comment (Limited by abdominal pain)    Frequency  Min  2X/week    PT Plan Current plan remains appropriate    Co-evaluation             End of Session Equipment Utilized During Treatment: Gait belt Activity Tolerance: Patient limited by pain Patient left: in chair;with call bell/phone within reach;with family/visitor present;with chair alarm set     Time: [id number] PT Time Calculation (min) (ACUTE ONLY): 23 min  Charges:                       G Codes:      Sherral Hammers Mar 26, 2016, 12:47 PM M. Barnett Abu, SPT

## 2016-03-23 NOTE — Progress Notes (Signed)
Patient states he does not feel as short of breath. Respirations are now 20. SPO2 95% on 2 LNC. He is resting quietly with bed in lowest position and bed alarm on with call light in reach. Will continue to monitor

## 2016-03-23 NOTE — Progress Notes (Signed)
Revisited patient with wife present to discuss findings of HIDA scan.  Patient has been placed on oxygen for elevated respiratory rate he appears to be somewhat more comfortable but remains tachypneic  HIDA scan shows obstructed cystic duct suggestive of acute cholecystitis. This with the findings of elevated white blood cell count is likely confirmatory of acute cholecystitis in the presence of a tender right upper quadrant.  This patient with acute cholecystitis and significant comorbidities including CHF and respiratory distress. This patient would not be a candidate for general anesthetic today but may require cholecystectomy at some point. To temporize this I would suggest a cholecystostomy tube placed under ultrasound guidance and I will place that order. I discussed this with the patient his wife and Dr. Bridgett Larsson who is in agreement.

## 2016-03-23 NOTE — Progress Notes (Signed)
Patient respirations 28 hr 82. Tachypnea and shortness of breath noted. Lung sounds bilateral wheezing noted. SPO2 87 on room air 2 LNC applied SPO2 up to 94. Patient complains of pain at an 8. Dr. Benjie Karvonen notified stated that she would come to see the patient.

## 2016-03-23 NOTE — Progress Notes (Signed)
[place] at [place] NAME: Joseph [name]    MR#:  [id number]  DATE OF BIRTH:  1925/01/02  SUBJECTIVE:  Patient Still complaining of right upper quadrant abdominal pain. Patient is tolerating diet. REVIEW OF SYSTEMS:    Review of Systems  Constitutional: Negative for fever, chills and malaise/fatigue.  HENT: Negative for ear discharge, ear pain, hearing loss, nosebleeds and sore throat.   Eyes: Negative for blurred vision and pain.  Respiratory: Negative for cough, hemoptysis, shortness of breath and wheezing.   Cardiovascular: Negative for chest pain, palpitations and leg swelling.  Gastrointestinal: Positive for abdominal pain. Negative for nausea, vomiting, diarrhea and blood in stool.  Genitourinary: Negative for dysuria.  Musculoskeletal: Negative for back pain.  Neurological: Negative for dizziness, tremors, speech change, focal weakness, seizures and headaches.  Endo/Heme/Allergies: Does not bruise/bleed easily.  Psychiatric/Behavioral: Negative for depression, suicidal ideas and hallucinations.    Tolerating Diet:yes      DRUG ALLERGIES:  No Known Allergies  VITALS:  Blood pressure 153/56, pulse 77, temperature 97.8 F (36.6 C), temperature source Oral, resp. rate 20, height 5\' 7"  (1.702 m), weight 104.463 kg (230 lb 4.8 oz), SpO2 91 %.  PHYSICAL EXAMINATION:   Physical Exam  Constitutional: He is oriented to person, place, and time and well-developed, well-nourished, and in no distress. No distress.  HENT:  Head: Normocephalic.  Eyes: No scleral icterus.  Neck: Normal range of motion. Neck supple. No JVD present. No tracheal deviation present.  Cardiovascular: Normal rate, regular rhythm and normal heart sounds.  Exam reveals no gallop and no friction rub.   No murmur heard. Pulmonary/Chest: Effort normal and breath sounds normal. No respiratory distress. He has no wheezes. He has no rales. He exhibits no tenderness.   Abdominal: Soft. Bowel sounds are normal. He exhibits no distension and no mass. There is tenderness. There is no rebound and no guarding.  Musculoskeletal: Normal range of motion. He exhibits no edema.  Neurological: He is alert and oriented to person, place, and time.  Skin: Skin is warm. No rash noted. No erythema.  Psychiatric: Affect and judgment normal.      LABORATORY PANEL:   CBC  Recent Labs Lab 03/23/16 0824  WBC 25.5*  HGB 11.1*  HCT 32.7*  PLT 188   ------------------------------------------------------------------------------------------------------------------  Chemistries   Recent Labs Lab 03/23/16 0824  NA 133*  K 5.3*  CL 100*  CO2 25  GLUCOSE 140*  BUN 44*  CREATININE 2.02*  CALCIUM 8.3*  AST 28  ALT 22  ALKPHOS 47  BILITOT 1.0   ------------------------------------------------------------------------------------------------------------------  Cardiac Enzymes  Recent Labs Lab 03/21/16 0730  TROPONINI <0.03   ------------------------------------------------------------------------------------------------------------------  RADIOLOGY:  Dg Chest 1 View  03/22/2016  CLINICAL DATA:  Chronic diastolic heart failure EXAM: CHEST 1 VIEW COMPARISON:  03/21/2016 FINDINGS: Previous median sternotomy and CABG procedure. Mild cardiac enlargement. No pleural effusion or edema. The visualized osseous structures appear intact. IMPRESSION: 1. No evidence for CHF. Electronically Signed   By: Kerby Moors M.D.   On: 03/22/2016 09:06   US Abdomen Limited Ruq  03/21/2016  CLINICAL DATA:  Right upper quadrant tenderness for 1 day. EXAM: US ABDOMEN LIMITED - RIGHT UPPER QUADRANT COMPARISON:  None. FINDINGS: Gallbladder: Innumerable small stones are identified measuring up to 0.6 cm. There is no gallbladder wall thickening or pericholecystic fluid. Sonographer reports negative Murphy's sign. Common bile duct: Diameter: 0.4 cm. Liver: No focal lesion identified.  Within normal limits in parenchymal  echogenicity. IMPRESSION: Multiple small gallstones without evidence of cholecystitis. Electronically Signed   By: Inge Rise M.D.   On: 03/21/2016 13:35     ASSESSMENT AND PLAN:   82 year old male with a history of CAD/4 vessel CABG, diastolic heart failure and hyperlipidemia who presented with chest pain and abdominal pain and found to have diverticulitis. For further details please refer the H&P.  1. Acute sigmoid diverticulitis: Now with right upper quadrant abdominal pain: Initial CT shows mild acute sigmoid diverticulitis with right upper chronic ultrasound showing gallstones but no acute cholecystitis. Continue Cipro and Flagyl for now. HIDA scan to evaluate for acute cholecystitis. White blood cell count is elevated which may be due to infection or leukemoid reaction. Follow CBC in a.m.Marland Kitchen Case discussed with Dr. Burt Knack who will see patient in consultation. Keep patient nothing by mouth Consider changing antibiotics to Zosyn. Discussed with Dr. Burt Knack who states that at this time continue current antibiotics and if HIDA scan shows acute cholecystitis then consider changing antibiotics.   2. Acute respiratory distress with hypoxia: Repeat chest x-ray showed no evidence of pneumonia or CHF. Oxygen has been weaned off.  3. CAD status post CABG: EKG was negative for acute change. Patient will continue aspirin, Coreg and Lipitor.  torsemide is discontinued due to acute kidney injury.  Patient will follow-up with cardiology with planned cardiac catheterization in early May.  4. Chronic diastolic heart failure: Torsemide has been discontinued due to acute kidney injury. Continue Coreg.  5. Acute on chronic kidney disease stage II: Creatinine initially improved and now has worsened due to ATN from problem #1. Restart IV fluids. Hold nephrotoxic agents. Repeat BMP in a.m. Nephrology consult if needed   6. Diabetes: Patient will continue  outpatient regimen and ADA diet. Appreciate diabetes coordinator consultation    Management plans discussed with the patient and he is in agreement.  CODE STATUS: FULL   TOTAL TIME TAKING CARE OF THIS PATIENT: 33 minutes.  D/w dr cooper   POSSIBLE D/C 1-2 days, DEPENDING ON CLINICAL CONDITION.   Airlie Blumenberg M.D on 03/22/2016 at 11:48 AM  Between 7am to 6pm - Pager - [phone] After 6pm go to [email or web address] - password [email or web address] [facility]  Office  [phone]  CC: Primary care physician; Wilhemena Durie, MD  Note: This dictation was prepared with Dragon dictation along with smaller phrase technology. Any transcriptional errors that result from this process are unintentional.

## 2016-03-23 NOTE — Progress Notes (Signed)
Inpatient Diabetes Program Recommendations  AACE/ADA: New Consensus Statement on Inpatient Glycemic Control (2015)  Target Ranges:  Prepandial:   less than 140 mg/dL      Peak postprandial:   less than 180 mg/dL (1-2 hours)      Critically ill patients:  140 - 180 mg/dL   Review of Glycemic Control:  Results for ORBAN, MCCRUDDEN (MRN [id number]) as of 03/23/2016 10:17  Ref. Range 03/22/2016 07:37 03/22/2016 11:34 03/22/2016 16:31 03/22/2016 20:48 03/23/2016 07:26  Glucose-Capillary Latest Ref Range: 65-99 mg/dL 90 140 (H) 180 (H) 173 (H) 149 (H)   Diabetes history: Type 2 diabetes-insulin dependent Outpatient Diabetes medications:  Lantus 70 units q HS, Novolog 26 units breakfast/16 units lunch/28 units supper  Current orders for Inpatient glycemic control:  Lantus 70 units q HS, Novolog sensitive tid with meals and HS  Inpatient Diabetes Program Recommendations:    Referral received for education.  Saw patient who is currently having significant abdominal pain.  He states that he thinks everything is "fine" with his diabetes and he doesn't have any questions at this time. No family in the room.  Will follow.  Thanks, Adah Perl, RN, BC-ADM Inpatient Diabetes Coordinator Pager [phone] (8a-5p)

## 2016-03-23 NOTE — Progress Notes (Signed)
Dr. Bridgett Larsson in room to assess patient. Ordered to stop NS and give 20 mg IV furosemide. Will continue to monitor patient.

## 2016-03-23 NOTE — Care Management Important Message (Signed)
Important Message  Patient Details  Name: Joseph [name] MRN: [id number] Date of Birth: 06-14-1925   Medicare Important Message Given:  Yes    [name] 03/23/2016, 2:15 PM

## 2016-03-23 NOTE — Consult Note (Signed)
Surgical Consultation  03/23/2016  Joseph [name] is an 82 y.o. male.   CC: Upper abdominal pain  HPI: Is patient with several days of upper abdominal pain which was constant and is now episodic he points to both upper quadrants and epigastrium and somewhat describes a belt-like sensation. He's had  Nausea but no recent emesis and no fevers or chills.Marland Kitchen He was admitted with a diagnosis of diverticulitis seen on CT scan and a recent workup and shown gallstones. I was asked to see the patient for gallbladder disease in a patient admitted for diverticulitis with significant medical problems including CAD and CHF.  Past Medical History  Diagnosis Date  . CAD (coronary artery disease)     a. s/p CABG;  b. 07/2012 Cath: 3VD including 80 dLCX (med Rx), 4/4 patent grafts.  . Diabetes mellitus, type 2 ([place])   . Hyperlipidemia   . HTN (hypertension)   . Hypothyroidism   . Chronic diastolic CHF (congestive heart failure) ([place])     a. 07/2012 Echo: EF 55-60%, no rwma, Gr 1 DD, mild MR;  04/2014 Echo: EF 55-60%, no rwma, mild MR, mildly dil LA.  Marland Kitchen Lower extremity edema     a. 03/2014 amlodipine d/c'd.    Past Surgical History  Procedure Laterality Date  . Prostate biopsy    . Hemorrhoid surgery    . Appendectomy    . Toe surgery      ingrown toe nail  . Coronary artery bypass graft  1994  . Cardiac catheterization  07/2012    [facility]/no stents  . Corneal transplant      Family History  Problem Relation Age of Onset  . Heart failure Mother   . Heart attack Father   . Heart failure Maternal Uncle   . Heart failure Maternal Grandfather     Social History:  reports that he quit smoking about 35 years ago. His smoking use included Cigarettes. He smoked 1.00 pack per day. He has never used smokeless tobacco. He reports that he does not drink alcohol or use illicit drugs.  Allergies: No Known Allergies  Medications reviewed.   Review of Systems:   Review of Systems  Constitutional:  Negative for fever and chills.  HENT: Negative.   Eyes: Negative.   Respiratory: Positive for shortness of breath. Negative for cough and hemoptysis.   Cardiovascular: Positive for chest pain, orthopnea and leg swelling. Negative for palpitations and claudication.  Gastrointestinal: Positive for nausea and abdominal pain. Negative for heartburn, vomiting, diarrhea, constipation, blood in stool and melena.  Genitourinary: Negative.   Musculoskeletal: Negative.   Skin: Negative.   Neurological: Negative.   Endo/Heme/Allergies: Negative.   Psychiatric/Behavioral: Negative.      Physical Exam:  BP 128/43 mmHg  Pulse 68  Temp(Src) 97.5 F (36.4 C) (Oral)  Resp 22  Ht 5' 7"  (1.702 m)  Wt 230 lb 4.8 oz (104.463 kg)  BMI 36.06 kg/m2  SpO2 91%  Physical Exam  Constitutional: He is oriented to person, place, and time. He appears distressed.  Patient appears clinically short of breath and unable to complete sentences without taking a breath.  HENT:  Head: Normocephalic and atraumatic.  Eyes: Pupils are equal, round, and reactive to light. Right eye exhibits no discharge. Left eye exhibits no discharge. No scleral icterus.  Neck: Normal range of motion. JVD present.  Cardiovascular: Normal rate and regular rhythm.   Pulmonary/Chest: Effort normal. No respiratory distress. He has no wheezes. He has rales.  Abdominal: Soft.  He exhibits no distension. There is tenderness. There is no rebound and no guarding.  Tender in both upper quadrants negative Murphy sign  Musculoskeletal: He exhibits edema. He exhibits no tenderness.  Lymphadenopathy:    He has no cervical adenopathy.  Neurological: He is alert and oriented to person, place, and time.  Skin: Skin is warm and dry. He is not diaphoretic. No erythema.  Psychiatric: Mood and affect normal.  Vitals reviewed.     Results for orders placed or performed during the hospital encounter of 03/21/16 (from the past 48 hour(s))  Glucose,  capillary     Status: Abnormal   Collection Time: 03/21/16  5:47 PM  Result Value Ref Range   Glucose-Capillary 180 (H) 65 - 99 mg/dL   Comment 1 Notify RN   Glucose, capillary     Status: Abnormal   Collection Time: 03/21/16  8:34 PM  Result Value Ref Range   Glucose-Capillary 233 (H) 65 - 99 mg/dL   Comment 1 Notify RN   Basic metabolic panel     Status: Abnormal   Collection Time: 03/22/16  4:26 AM  Result Value Ref Range   Sodium 141 135 - 145 mmol/L   Potassium 5.0 3.5 - 5.1 mmol/L   Chloride 105 101 - 111 mmol/L   CO2 28 22 - 32 mmol/L   Glucose, Bld 106 (H) 65 - 99 mg/dL   BUN 33 (H) 6 - 20 mg/dL   Creatinine, Ser 1.60 (H) 0.61 - 1.24 mg/dL   Calcium 8.6 (L) 8.9 - 10.3 mg/dL   GFR calc non Af Amer 37 (L) >60 mL/min   GFR calc Af Amer 43 (L) >60 mL/min    Comment: (NOTE) The eGFR has been calculated using the CKD EPI equation. This calculation has not been validated in all clinical situations. eGFR's persistently <60 mL/min signify possible Chronic Kidney Disease.    Anion gap 8 5 - 15  CBC     Status: Abnormal   Collection Time: 03/22/16  4:26 AM  Result Value Ref Range   WBC 15.3 (H) 3.8 - 10.6 K/uL   RBC 3.79 (L) 4.40 - 5.90 MIL/uL   Hemoglobin 11.5 (L) 13.0 - 18.0 g/dL   HCT 34.7 (L) 40.0 - 52.0 %   MCV 91.5 80.0 - 100.0 fL   MCH 30.3 26.0 - 34.0 pg   MCHC 33.1 32.0 - 36.0 g/dL   RDW 14.2 11.5 - 14.5 %   Platelets 187 150 - 440 K/uL  Glucose, capillary     Status: None   Collection Time: 03/22/16  7:37 AM  Result Value Ref Range   Glucose-Capillary 90 65 - 99 mg/dL   Comment 1 Notify RN   Glucose, capillary     Status: Abnormal   Collection Time: 03/22/16 11:34 AM  Result Value Ref Range   Glucose-Capillary 140 (H) 65 - 99 mg/dL   Comment 1 Notify RN   Glucose, capillary     Status: Abnormal   Collection Time: 03/22/16  4:31 PM  Result Value Ref Range   Glucose-Capillary 180 (H) 65 - 99 mg/dL   Comment 1 Notify RN   Glucose, capillary     Status:  Abnormal   Collection Time: 03/22/16  8:48 PM  Result Value Ref Range   Glucose-Capillary 173 (H) 65 - 99 mg/dL   Comment 1 Notify RN   Glucose, capillary     Status: Abnormal   Collection Time: 03/23/16  7:26 AM  Result Value Ref  Range   Glucose-Capillary 149 (H) 65 - 99 mg/dL   Comment 1 Notify RN   CBC     Status: Abnormal   Collection Time: 03/23/16  8:24 AM  Result Value Ref Range   WBC 25.5 (H) 3.8 - 10.6 K/uL   RBC 3.58 (L) 4.40 - 5.90 MIL/uL   Hemoglobin 11.1 (L) 13.0 - 18.0 g/dL   HCT 32.7 (L) 40.0 - 52.0 %   MCV 91.2 80.0 - 100.0 fL   MCH 30.8 26.0 - 34.0 pg   MCHC 33.8 32.0 - 36.0 g/dL   RDW 14.2 11.5 - 14.5 %   Platelets 188 150 - 440 K/uL    Comment: COUNT MAY BE INACCURATE DUE TO FIBRIN CLUMPS.  Comprehensive metabolic panel     Status: Abnormal   Collection Time: 03/23/16  8:24 AM  Result Value Ref Range   Sodium 133 (L) 135 - 145 mmol/L   Potassium 5.3 (H) 3.5 - 5.1 mmol/L   Chloride 100 (L) 101 - 111 mmol/L   CO2 25 22 - 32 mmol/L   Glucose, Bld 140 (H) 65 - 99 mg/dL   BUN 44 (H) 6 - 20 mg/dL   Creatinine, Ser 2.02 (H) 0.61 - 1.24 mg/dL   Calcium 8.3 (L) 8.9 - 10.3 mg/dL   Total Protein 5.6 (L) 6.5 - 8.1 g/dL   Albumin 2.7 (L) 3.5 - 5.0 g/dL   AST 28 15 - 41 U/L   ALT 22 17 - 63 U/L   Alkaline Phosphatase 47 38 - 126 U/L   Total Bilirubin 1.0 0.3 - 1.2 mg/dL   GFR calc non Af Amer 28 (L) >60 mL/min   GFR calc Af Amer 32 (L) >60 mL/min    Comment: (NOTE) The eGFR has been calculated using the CKD EPI equation. This calculation has not been validated in all clinical situations. eGFR's persistently <60 mL/min signify possible Chronic Kidney Disease.    Anion gap 8 5 - 15  Glucose, capillary     Status: Abnormal   Collection Time: 03/23/16 11:53 AM  Result Value Ref Range   Glucose-Capillary 203 (H) 65 - 99 mg/dL   Comment 1 Notify RN   Glucose, capillary     Status: Abnormal   Collection Time: 03/23/16  4:35 PM  Result Value Ref Range    Glucose-Capillary 140 (H) 65 - 99 mg/dL   Dg Chest 1 View  03/22/2016  CLINICAL DATA:  Chronic diastolic heart failure EXAM: CHEST 1 VIEW COMPARISON:  03/21/2016 FINDINGS: Previous median sternotomy and CABG procedure. Mild cardiac enlargement. No pleural effusion or edema. The visualized osseous structures appear intact. IMPRESSION: 1. No evidence for CHF. Electronically Signed   By: Kerby Moors M.D.   On: 03/22/2016 09:06   Nm Hepatobiliary Liver Func  03/23/2016  CLINICAL DATA:  Abdominal pain with gallstones. Elevated white blood cell count. EXAM: NUCLEAR MEDICINE HEPATOBILIARY IMAGING TECHNIQUE: Sequential images of the abdomen were obtained out to 60 minutes following intravenous administration of radiopharmaceutical. 4.0 mg IV morphine was administered IV after 60 minutes of imaging. RADIOPHARMACEUTICALS:  5.4 mCi Tc-69mCholetec IV additional 1.3 millicuries Choletec given as booster dose. COMPARISON:  CT 03/21/2016, ultrasound 03/21/2016 FINDINGS: There is prompt clearance of radiotracer from the blood pool and homogeneous uptake in the liver. Counts are evident insmall bowel by 20 minutes. The gallbladder does not fill by 1 hour. IV morphine was administered to constricts the sphincter of Odi. No filling defect of the gallbladder was evident following  IV morphine administration. IMPRESSION: 1. Non filling of the gallbladder after 90 minutes of imaging and administration of IV morphine is consistent with obstruction of the cystic duct and ACUTE CHOLECYSTITIS. 2. Patent common bile duct. Findings conveyed [name], RN on 03/23/2016  at16:50. Electronically Signed   By: [name] M.D.   On: 03/23/2016 16:50    Assessment/Plan:  HIDA scan is personally reviewed suggestive of acute cholecystitis. The patient appears short of breath and his respiratory rate is elevated. I am not sure this patient would tolerate a general anesthetic at this point with his history of CHF and CAD. With  that in mind: The gallbladder could be drained percutaneously as this may improve his overall performance prior to offering surgery. I will try to discuss this with Dr. Genia Harold and proceed with cholecystostomy tube. Family members were present but I did not have the results of the HIDA scan at that time to discuss this with them and I will discuss later.  Florene Glen, MD, FACS

## 2016-03-23 NOTE — Telephone Encounter (Signed)
Tried to call pt. Per pt wife, pt is still in the hospital. They did not discharge him yesterday as planned because he was still in pain. Will call when he does get discharged.

## 2016-03-24 ENCOUNTER — Inpatient Hospital Stay: Payer: PPO

## 2016-03-24 ENCOUNTER — Inpatient Hospital Stay: Payer: PPO | Admitting: Family Medicine

## 2016-03-24 ENCOUNTER — Telehealth: Payer: Self-pay | Admitting: Cardiovascular Disease

## 2016-03-24 DIAGNOSIS — K810 Acute cholecystitis: Secondary | ICD-10-CM | POA: Insufficient documentation

## 2016-03-24 LAB — COMPREHENSIVE METABOLIC PANEL
ALBUMIN: 2.8 g/dL — AB (ref 3.5–5.0)
ALT: 20 U/L (ref 17–63)
AST: 24 U/L (ref 15–41)
Alkaline Phosphatase: 56 U/L (ref 38–126)
Anion gap: 7 (ref 5–15)
BUN: 46 mg/dL — AB (ref 6–20)
CHLORIDE: 101 mmol/L (ref 101–111)
CO2: 26 mmol/L (ref 22–32)
Calcium: 8.3 mg/dL — ABNORMAL LOW (ref 8.9–10.3)
Creatinine, Ser: 1.96 mg/dL — ABNORMAL HIGH (ref 0.61–1.24)
GFR calc Af Amer: 33 mL/min — ABNORMAL LOW (ref >60)
GFR calc non Af Amer: 29 mL/min — ABNORMAL LOW (ref >60)
GLUCOSE: 136 mg/dL — AB (ref 65–99)
POTASSIUM: 4.9 mmol/L (ref 3.5–5.1)
SODIUM: 134 mmol/L — AB (ref 135–145)
Total Bilirubin: 1 mg/dL (ref 0.3–1.2)
Total Protein: 6.4 g/dL — ABNORMAL LOW (ref 6.5–8.1)

## 2016-03-24 LAB — GLUCOSE, CAPILLARY
GLUCOSE-CAPILLARY: 122 mg/dL — AB (ref 65–99)
GLUCOSE-CAPILLARY: 144 mg/dL — AB (ref 65–99)
Glucose-Capillary: 124 mg/dL — ABNORMAL HIGH (ref 65–99)
Glucose-Capillary: 125 mg/dL — ABNORMAL HIGH (ref 65–99)
Glucose-Capillary: 211 mg/dL — ABNORMAL HIGH (ref 65–99)

## 2016-03-24 LAB — PROTIME-INR
INR: 1.37
PROTHROMBIN TIME: 17 s — AB (ref 11.4–15.0)

## 2016-03-24 LAB — CBC
HEMATOCRIT: 32.7 % — AB (ref 40.0–52.0)
Hemoglobin: 11 g/dL — ABNORMAL LOW (ref 13.0–18.0)
MCH: 30.7 pg (ref 26.0–34.0)
MCHC: 33.6 g/dL (ref 32.0–36.0)
MCV: 91.3 fL (ref 80.0–100.0)
PLATELETS: 176 10*3/uL (ref 150–440)
RBC: 3.58 MIL/uL — ABNORMAL LOW (ref 4.40–5.90)
RDW: 14.4 % (ref 11.5–14.5)
WBC: 19.8 10*3/uL — AB (ref 3.8–10.6)

## 2016-03-24 LAB — APTT: aPTT: 32 seconds (ref 24–36)

## 2016-03-24 MED ORDER — IOPAMIDOL (ISOVUE-300) INJECTION 61%
30.0000 mL | Freq: Once | INTRAVENOUS | Status: AC | PRN
  Administered 2016-03-24: 10 mL

## 2016-03-24 MED ORDER — ONDANSETRON HCL 4 MG/2ML IJ SOLN
INTRAMUSCULAR | Status: AC | PRN
  Administered 2016-03-24: 4 mg via INTRAVENOUS

## 2016-03-24 MED ORDER — LEVOFLOXACIN IN D5W 250 MG/50ML IV SOLN
250.0000 mg | Freq: Once | INTRAVENOUS | Status: AC
  Administered 2016-03-24: 250 mg via INTRAVENOUS
  Filled 2016-03-24: qty 50

## 2016-03-24 MED ORDER — MEPERIDINE HCL 25 MG/ML IJ SOLN
25.0000 mg | Freq: Once | INTRAMUSCULAR | Status: AC
  Administered 2016-03-24: 25 mg via INTRAVENOUS

## 2016-03-24 MED ORDER — FENTANYL CITRATE (PF) 100 MCG/2ML IJ SOLN
INTRAMUSCULAR | Status: AC
  Filled 2016-03-24: qty 2

## 2016-03-24 MED ORDER — LIDOCAINE HCL (PF) 1 % IJ SOLN
INTRAMUSCULAR | Status: AC
  Filled 2016-03-24: qty 30

## 2016-03-24 MED ORDER — FENTANYL CITRATE (PF) 100 MCG/2ML IJ SOLN
INTRAMUSCULAR | Status: AC | PRN
  Administered 2016-03-24 (×2): 50 ug via INTRAVENOUS

## 2016-03-24 MED ORDER — HEPARIN (PORCINE) IN NACL 2-0.9 UNIT/ML-% IJ SOLN
INTRAMUSCULAR | Status: AC
  Filled 2016-03-24: qty 1000

## 2016-03-24 MED ORDER — SENNOSIDES-DOCUSATE SODIUM 8.6-50 MG PO TABS
1.0000 | ORAL_TABLET | Freq: Two times a day (BID) | ORAL | Status: DC
  Administered 2016-03-24 – 2016-03-26 (×4): 1 via ORAL
  Filled 2016-03-24 (×4): qty 1

## 2016-03-24 MED ORDER — PIPERACILLIN-TAZOBACTAM 3.375 G IVPB
3.3750 g | Freq: Three times a day (TID) | INTRAVENOUS | Status: DC
  Administered 2016-03-24 – 2016-03-28 (×12): 3.375 g via INTRAVENOUS
  Filled 2016-03-24 (×15): qty 50

## 2016-03-24 MED ORDER — MIDAZOLAM HCL 2 MG/2ML IJ SOLN
INTRAMUSCULAR | Status: AC | PRN
  Administered 2016-03-24 (×2): 1 mg via INTRAVENOUS

## 2016-03-24 MED ORDER — IPRATROPIUM-ALBUTEROL 0.5-2.5 (3) MG/3ML IN SOLN
RESPIRATORY_TRACT | Status: AC
  Administered 2016-03-24: 3 mL via RESPIRATORY_TRACT
  Filled 2016-03-24: qty 3

## 2016-03-24 MED ORDER — MEPERIDINE HCL 25 MG/ML IJ SOLN
INTRAMUSCULAR | Status: AC
  Administered 2016-03-24: 25 mg via INTRAVENOUS
  Filled 2016-03-24: qty 1

## 2016-03-24 MED ORDER — ENOXAPARIN SODIUM 40 MG/0.4ML ~~LOC~~ SOLN
40.0000 mg | SUBCUTANEOUS | Status: DC
  Administered 2016-03-24 – 2016-03-27 (×4): 40 mg via SUBCUTANEOUS
  Filled 2016-03-24 (×4): qty 0.4

## 2016-03-24 MED ORDER — BISACODYL 10 MG RE SUPP
10.0000 mg | Freq: Once | RECTAL | Status: DC
  Filled 2016-03-24: qty 1

## 2016-03-24 MED ORDER — MEPERIDINE HCL 25 MG/ML IJ SOLN
INTRAMUSCULAR | Status: AC
  Filled 2016-03-24: qty 1

## 2016-03-24 MED ORDER — MIDAZOLAM HCL 5 MG/5ML IJ SOLN
INTRAMUSCULAR | Status: AC
  Filled 2016-03-24: qty 5

## 2016-03-24 MED ORDER — IPRATROPIUM-ALBUTEROL 0.5-2.5 (3) MG/3ML IN SOLN
3.0000 mL | RESPIRATORY_TRACT | Status: DC | PRN
  Administered 2016-03-24: 3 mL via RESPIRATORY_TRACT

## 2016-03-24 NOTE — Telephone Encounter (Signed)
Pt wife calling stating patient is in hospital and she is not sure if patient can do cath  or do any upcoming appointments  Please advise

## 2016-03-24 NOTE — Progress Notes (Signed)
Unable to administer am insulin at appropriate time.  FSBS at this time is 122, am insulin given

## 2016-03-24 NOTE — OR Nursing (Signed)
poct 145

## 2016-03-24 NOTE — Progress Notes (Signed)
To Specials via bed 

## 2016-03-24 NOTE — Plan of Care (Signed)
Problem: Safety: Goal: Ability to remain free from injury will improve Outcome: Progressing Fall precautions in place  Problem: Tissue Perfusion: Goal: Risk factors for ineffective tissue perfusion will decrease Outcome: Progressing SQ Lovenox

## 2016-03-24 NOTE — Progress Notes (Signed)
Pt more restful post iv fluids as well as gall bladder drg bag, demoral iv, still st per monitor, somewhat confused post procedure with md aware of Dr Jacqualyn Posey has seen pt with Dr Posey Pronto called for consult as well, no visible resp. Distress at this time with sats at 97%, family at bedside, Dr Jacqualyn Posey out to speak with family regarding pt's status.

## 2016-03-24 NOTE — Progress Notes (Signed)
Anticoagulation Monitoring:  Patient is an 82 yo male with orders for Lovenox 30 mg subq q24h for DVT prophylaxis. Renal function is improving and est CrCl today is ~30 mL/min.  Therefore, given patient's weight and CrCl, will transition to Lovenox 40 mg subq q24h per anticoagulation policy.  Pharmacy will continue to follow per policy.  Murrell Converse, PharmD Clinical Pharmacist 03/24/2016

## 2016-03-24 NOTE — Progress Notes (Signed)
Spoke with Dr Posey Pronto regarding pt's improving status, ok'd pt to go to 241, thus called report to [facility] with details of past 4 hours discussed, family with pt, and aware of pt's status.

## 2016-03-24 NOTE — OR Nursing (Signed)
Pt groaning, get to answer questions, respirations 40's/ Dr Shanda Bumps to bedside to assess pt.

## 2016-03-24 NOTE — Procedures (Signed)
Interventional Radiology Procedure Note  Procedure: Image guided perc cholecystostomy.  Pigtail drain placed to gravity drain. Sample sent to lab.   Complications: None Recommendations:  - Maintain to gravity - Do not aspirate drain. - Record daily output - Do not submerge  - Routine drain care   Signed,  Dulcy Fanny. Earleen Newport, DO

## 2016-03-24 NOTE — Progress Notes (Signed)
Patient ID: Joseph [name], male   DOB: 06-10-1925, 82 y.o.   MRN: [id number] [facility] NAME: Joseph [name]    MR#:  [id number]  DATE OF BIRTH:  21-Aug-1925  SUBJECTIVE:  Complains of right upper quadrant pain. No fever.  REVIEW OF SYSTEMS:   Review of Systems  Constitutional: Negative for fever, chills and weight loss.  HENT: Negative for ear discharge, ear pain and nosebleeds.   Eyes: Negative for blurred vision, pain and discharge.  Respiratory: Negative for sputum production, shortness of breath, wheezing and stridor.   Cardiovascular: Negative for chest pain, palpitations, orthopnea and PND.  Gastrointestinal: Positive for abdominal pain. Negative for nausea, vomiting and diarrhea.  Genitourinary: Negative for urgency and frequency.  Musculoskeletal: Negative for back pain and joint pain.  Neurological: Negative for sensory change, speech change, focal weakness and weakness.  Psychiatric/Behavioral: Negative for depression and hallucinations. The patient is not nervous/anxious.   All other systems reviewed and are negative.  Tolerating Diet: Nothing by mouth Tolerating PT: Pending  DRUG ALLERGIES:  No Known Allergies  VITALS:  Blood pressure 138/60, pulse 85, temperature 99.7 F (37.6 C), temperature source Oral, resp. rate 20, height 5\' 7"  (1.702 m), weight 103.964 kg (229 lb 3.2 oz), SpO2 95 %.  PHYSICAL EXAMINATION:   Physical Exam  GENERAL:  82 y.o.-year-old patient lying in the bed with no acute distress.  EYES: Pupils equal, round, reactive to light and accommodation. No scleral icterus. Extraocular muscles intact.  HEENT: Head atraumatic, normocephalic. Oropharynx and nasopharynx clear.  NECK:  Supple, no jugular venous distention. No thyroid enlargement, no tenderness.  LUNGS: Normal breath sounds bilaterally, no wheezing, rales, rhonchi. No use of accessory muscles of respiration.   CARDIOVASCULAR: S1, S2 normal. No murmurs, rubs, or gallops.  ABDOMEN: Soft, Right upper quadrant tender, nondistended. Bowel sounds present. No organomegaly or mass.  EXTREMITIES: No cyanosis, clubbing or edema b/l.    NEUROLOGIC: Cranial nerves II through XII are intact. No focal Motor or sensory deficits b/l.   PSYCHIATRIC:  patient is alert and oriented x 3.  SKIN: No obvious rash, lesion, or ulcer.   LABORATORY PANEL:  CBC  Recent Labs Lab 03/24/16 0424  WBC 19.8*  HGB 11.0*  HCT 32.7*  PLT 176    Chemistries   Recent Labs Lab 03/24/16 0424  NA 134*  K 4.9  CL 101  CO2 26  GLUCOSE 136*  BUN 46*  CREATININE 1.96*  CALCIUM 8.3*  AST 24  ALT 20  ALKPHOS 56  BILITOT 1.0   Cardiac Enzymes  Recent Labs Lab 03/21/16 0730  TROPONINI <0.03   RADIOLOGY:  Nm Hepatobiliary Liver Func  03/23/2016  CLINICAL DATA:  Abdominal pain with gallstones. Elevated white blood cell count. EXAM: NUCLEAR MEDICINE HEPATOBILIARY IMAGING TECHNIQUE: Sequential images of the abdomen were obtained out to 60 minutes following intravenous administration of radiopharmaceutical. 4.0 mg IV morphine was administered IV after 60 minutes of imaging. RADIOPHARMACEUTICALS:  5.4 mCi Tc-72m Choletec IV additional 1.3 millicuries Choletec given as booster dose. COMPARISON:  CT 03/21/2016, ultrasound 03/21/2016 FINDINGS: There is prompt clearance of radiotracer from the blood pool and homogeneous uptake in the liver. Counts are evident insmall bowel by 20 minutes. The gallbladder does not fill by 1 hour. IV morphine was administered to constricts the sphincter of Odi. No filling defect of the gallbladder was evident following IV morphine administration. IMPRESSION: 1. Non filling of the gallbladder after 90 minutes  of imaging and administration of IV morphine is consistent with obstruction of the cystic duct and ACUTE CHOLECYSTITIS. 2. Patent common bile duct. Findings conveyed Joseph Savoy, RN on 03/23/2016   at16:50. Electronically Signed   By: [name] M.D.   On: 03/23/2016 16:50   ASSESSMENT AND PLAN:  82 year old male with a history of CAD/4 vessel CABG, diastolic heart failure and hyperlipidemia who presented with chest pain and abdominal pain and found to have diverticulitis. For further details please refer the H&P.  1. Acute sigmoid diverticulitis: And acute cholecystitis Initial CT shows mild acute sigmoid diverticulitis with right upper chronic ultrasound showing gallstones but no acute cholecystitis. Continue IV Zosyn  HIDA scan to evaluate for acute cholecystitis. Case discussed with Dr. Burt Knack who will see patient in consultation. Patient to get cholecystostomy tube placed in through interventional radiology today he is not a candidate for general anesthesia and surgery  2. Acute respiratory distress with hypoxia: Repeat chest x-ray showed no evidence of pneumonia or CHF. Oxygen has been weaned off.  3. CAD status post CABG: EKG was negative for acute change. Patient will continue aspirin, Coreg and Lipitor. torsemide is discontinued due to acute kidney injury.  Patient will follow-up with cardiology with planned cardiac catheterization in early May.  4. Chronic diastolic heart failure: Torsemide has been discontinued due to acute kidney injury. Continue Coreg.  5. Acute on chronic kidney disease stage II: Creatinine initially improved and now has worsened due to ATN from problem #1. Restart IV fluids. Hold nephrotoxic agents.  6. Diabetes: Patient will continue outpatient regimen and ADA diet. Appreciate diabetes coordinator consultation   Case discussed with Care Management/Social Worker. Management plans discussed with the patient, family and they are in agreement.  CODE STATUS: Full  DVT Prophylaxis: Lovenox TOTAL TIME TAKING CARE OF THIS PATIENT 30 minutes.  >50% time spent on counselling and coordination of care patient and family  POSSIBLE D/C IN 2-3  DAYS, DEPENDING ON CLINICAL CONDITION.  Note: This dictation was prepared with Dragon dictation along with smaller phrase technology. Any transcriptional errors that result from this process are unintentional.  [name] M.D on 03/24/2016 at 3:03 PM  Between 7am to 6pm - Pager - [phone]  After 6pm go to [email or web address] - password [email or web address] [facility]  Office  [phone]  CC: Primary care physician; Wilhemena Durie, MD

## 2016-03-24 NOTE — Telephone Encounter (Signed)
Pt is on our consult list.

## 2016-03-24 NOTE — Progress Notes (Signed)
Inpatient Diabetes Program Recommendations  AACE/ADA: New Consensus Statement on Inpatient Glycemic Control (2015)  Target Ranges:  Prepandial:   less than 140 mg/dL      Peak postprandial:   less than 180 mg/dL (1-2 hours)      Critically ill patients:  140 - 180 mg/dL   Diabetes history: Type 2 diabetes-insulin dependent Outpatient Diabetes medications: Lantus 70 units q HS, Novolog 26 units breakfast/16 units lunch/28 units supper  Current orders for Inpatient glycemic control: Lantus 70 units q HS, Novolog sensitive 0-9 units tid with meals and 0-5 units QHS  Inpatient Diabetes Program Recommendations:  If the patient is going to remain NPO for an extended period of time, please consider changing the Novolog correction to q4h.   Gentry Fitz, RN, BA, MHA, CDE Diabetes Coordinator Inpatient Diabetes Program  [phone] (Team Pager) [phone] ([place]) 03/24/2016 9:38 AM

## 2016-03-24 NOTE — Progress Notes (Signed)
CC: Acute cholecystitis Subjective:  With acute cholecystitis CHF CAD and respiratory failure who seems to be improved today but is still very tender. He is scheduled for ultrasound-guided cholecystostomy tube placement today. I discussed the patient's care with the radiologist.  Objective: Vital signs in last 24 hours: Temp:  [97.5 F (36.4 C)-101.1 F (38.4 C)] 99.6 F (37.6 C) (05/04 1122) Pulse Rate:  [64-82] 81 (05/04 1122) Resp:  [17-28] 18 (05/04 1122) BP: (123-156)/(43-56) 156/56 mmHg (05/04 1122) SpO2:  [91 %-95 %] 95 % (05/04 1122) Weight:  [229 lb 3.2 oz (103.964 kg)] 229 lb 3.2 oz (103.964 kg) (05/04 0344) Last BM Date: 03/21/16  Intake/Output from previous day: 05/03 0701 - 05/04 0700 In: 120 [P.O.:120] Out: -  Intake/Output this shift:    Physical exam:  Abdomen is tender in the right upper quadrant with a positive Murphy sign otherwise distended and slightly tympanitic area calves are nontender. Respiratory rate is still elevated but appears more comfortable today  Lab Results: CBC   Recent Labs  03/23/16 0824 03/24/16 0424  WBC 25.5* 19.8*  HGB 11.1* 11.0*  HCT 32.7* 32.7*  PLT 188 176   BMET  Recent Labs  03/23/16 0824 03/24/16 0424  NA 133* 134*  K 5.3* 4.9  CL 100* 101  CO2 25 26  GLUCOSE 140* 136*  BUN 44* 46*  CREATININE 2.02* 1.96*  CALCIUM 8.3* 8.3*   PT/INR  Recent Labs  03/24/16 1015  LABPROT 17.0*  INR 1.37   ABG No results for input(s): PHART, HCO3 in the last 72 hours.  Invalid input(s): PCO2, PO2  Studies/Results: Nm Hepatobiliary Liver Func  03/23/2016  CLINICAL DATA:  Abdominal pain with gallstones. Elevated white blood cell count. EXAM: NUCLEAR MEDICINE HEPATOBILIARY IMAGING TECHNIQUE: Sequential images of the abdomen were obtained out to 60 minutes following intravenous administration of radiopharmaceutical. 4.0 mg IV morphine was administered IV after 60 minutes of imaging. RADIOPHARMACEUTICALS:  5.4 mCi Tc-61m  Choletec IV additional 1.3 millicuries Choletec given as booster dose. COMPARISON:  CT 03/21/2016, ultrasound 03/21/2016 FINDINGS: There is prompt clearance of radiotracer from the blood pool and homogeneous uptake in the liver. Counts are evident insmall bowel by 20 minutes. The gallbladder does not fill by 1 hour. IV morphine was administered to constricts the sphincter of Odi. No filling defect of the gallbladder was evident following IV morphine administration. IMPRESSION: 1. Non filling of the gallbladder after 90 minutes of imaging and administration of IV morphine is consistent with obstruction of the cystic duct and ACUTE CHOLECYSTITIS. 2. Patent common bile duct. Findings conveyed [name], RN on 03/23/2016  at16:50. Electronically Signed   By: [name] M.D.   On: 03/23/2016 16:50    Anti-infectives: Anti-infectives    Start     Dose/Rate Route Frequency Ordered Stop   03/23/16 1730  piperacillin-tazobactam (ZOSYN) IVPB 3.375 g  Status:  Discontinued     3.375 g 12.5 mL/hr over 240 Minutes Intravenous Every 12 hours 03/23/16 1725 03/23/16 1727   03/23/16 1730  piperacillin-tazobactam (ZOSYN) IVPB 3.375 g     3.375 g 12.5 mL/hr over 240 Minutes Intravenous Every 12 hours 03/23/16 1727     03/23/16 1421  metroNIDAZOLE (FLAGYL) IVPB 500 mg  Status:  Discontinued     500 mg 100 mL/hr over 60 Minutes Intravenous Every 8 hours 03/23/16 0951 03/23/16 1723   03/22/16 0000  ciprofloxacin (CIPRO) 500 MG tablet     500 mg Oral 2 times daily 03/22/16 1056  03/22/16 0000  metroNIDAZOLE (FLAGYL) 500 MG tablet     500 mg Oral 3 times daily 03/22/16 1056     03/21/16 2000  ciprofloxacin (CIPRO) IVPB 400 mg  Status:  Discontinued     400 mg 200 mL/hr over 60 Minutes Intravenous Every 12 hours 03/21/16 1745 03/23/16 1723   03/21/16 1830  metroNIDAZOLE (FLAGYL) IVPB 500 mg  Status:  Discontinued     500 mg 100 mL/hr over 60 Minutes Intravenous Every 12 hours 03/21/16 1747 03/23/16  0951   03/21/16 1600  ciprofloxacin (CIPRO) IVPB 400 mg  Status:  Discontinued     400 mg 200 mL/hr over 60 Minutes Intravenous Every 12 hours 03/21/16 1547 03/21/16 1745   03/21/16 1600  metroNIDAZOLE (FLAGYL) IVPB 500 mg  Status:  Discontinued     500 mg 100 mL/hr over 60 Minutes Intravenous Every 12 hours 03/21/16 1547 03/21/16 1746   03/21/16 1315  piperacillin-tazobactam (ZOSYN) IVPB 3.375 g     3.375 g 100 mL/hr over 30 Minutes Intravenous  Once 03/21/16 1303 03/21/16 1435      Assessment/Plan:  choleCystostomy tube today. Continue IV antibiotics afterwards and advance diet and likely will be able to go home in the next 3 or 4 days.  Florene Glen, MD, FACS  03/24/2016

## 2016-03-24 NOTE — OR Nursing (Signed)
Dr patel notified of change of status by Dr Shanda Bumps, continue to monitor in specials, and assess bed status one hour after antibiotic and time.

## 2016-03-24 NOTE — Progress Notes (Signed)
Pt more stablilized post demoral/fluid bolus, along with levaquin iv given,earilier, Dr Jacqualyn Posey back to recheck  Who has improved and now is talking  With family members. Also paged and spoke with Dr Posey Pronto regarding pt status, ok'd to go back to room , report called to care nurse with plan reviewed, st per monitor, stable vitals at this time.

## 2016-03-24 NOTE — Progress Notes (Signed)
Pharmacy Antibiotic Note  Joseph [name] is a 82 y.o. male admitted on 03/21/2016 with cholecystitis.  Pharmacy has been consulted for Zosyn dosing.  Plan: The dose of Zosyn will be adjusted to 3.375 gm IV q8h based on renal function.  Height: 5\' 7"  (170.2 cm) Weight: 229 lb 3.2 oz (103.964 kg) IBW/kg (Calculated) : 66.1  Temp (24hrs), Avg:99.7 F (37.6 C), Min:98.8 F (37.1 C), Max:101.1 F (38.4 C)   Recent Labs Lab 03/18/16 1051 03/21/16 0730 03/22/16 0426 03/23/16 0824 03/24/16 0424  WBC 8.6 7.4 15.3* 25.5* 19.8*  CREATININE 1.41* 1.63* 1.60* 2.02* 1.96*    Estimated Creatinine Clearance: 30 mL/min (by C-G formula based on Cr of 1.96).    No Known Allergies  Antimicrobials this admission: Anti-infectives    Start     Dose/Rate Route Frequency Ordered Stop   03/24/16 2200  piperacillin-tazobactam (ZOSYN) IVPB 3.375 g     3.375 g 12.5 mL/hr over 240 Minutes Intravenous Every 8 hours 03/24/16 1305     03/23/16 1730  piperacillin-tazobactam (ZOSYN) IVPB 3.375 g  Status:  Discontinued     3.375 g 12.5 mL/hr over 240 Minutes Intravenous Every 12 hours 03/23/16 1725 03/23/16 1727   03/23/16 1730  piperacillin-tazobactam (ZOSYN) IVPB 3.375 g  Status:  Discontinued     3.375 g 12.5 mL/hr over 240 Minutes Intravenous Every 12 hours 03/23/16 1727 03/24/16 1305   03/23/16 1421  metroNIDAZOLE (FLAGYL) IVPB 500 mg  Status:  Discontinued     500 mg 100 mL/hr over 60 Minutes Intravenous Every 8 hours 03/23/16 0951 03/23/16 1723   03/22/16 0000  ciprofloxacin (CIPRO) 500 MG tablet     500 mg Oral 2 times daily 03/22/16 1056     03/22/16 0000  metroNIDAZOLE (FLAGYL) 500 MG tablet     500 mg Oral 3 times daily 03/22/16 1056     03/21/16 2000  ciprofloxacin (CIPRO) IVPB 400 mg  Status:  Discontinued     400 mg 200 mL/hr over 60 Minutes Intravenous Every 12 hours 03/21/16 1745 03/23/16 1723   03/21/16 1830  metroNIDAZOLE (FLAGYL) IVPB 500 mg  Status:  Discontinued     500  mg 100 mL/hr over 60 Minutes Intravenous Every 12 hours 03/21/16 1747 03/23/16 0951   03/21/16 1600  ciprofloxacin (CIPRO) IVPB 400 mg  Status:  Discontinued     400 mg 200 mL/hr over 60 Minutes Intravenous Every 12 hours 03/21/16 1547 03/21/16 1745   03/21/16 1600  metroNIDAZOLE (FLAGYL) IVPB 500 mg  Status:  Discontinued     500 mg 100 mL/hr over 60 Minutes Intravenous Every 12 hours 03/21/16 1547 03/21/16 1746   03/21/16 1315  piperacillin-tazobactam (ZOSYN) IVPB 3.375 g     3.375 g 100 mL/hr over 30 Minutes Intravenous  Once 03/21/16 1303 03/21/16 1435       Dose adjustments this admission: Est CrCl~30 mL/min.  Will transition patient to Zosyn 3.375 gm IV q8h per EI protocol  Microbiology results: No results found for this or any previous visit.  Thank you for allowing pharmacy to be a part of this patient's care.  Deolinda Frid G 03/24/2016 1:36 PM

## 2016-03-25 ENCOUNTER — Other Ambulatory Visit: Payer: Self-pay | Admitting: Diagnostic Radiology

## 2016-03-25 DIAGNOSIS — K8020 Calculus of gallbladder without cholecystitis without obstruction: Secondary | ICD-10-CM

## 2016-03-25 LAB — GLUCOSE, CAPILLARY
GLUCOSE-CAPILLARY: 277 mg/dL — AB (ref 65–99)
GLUCOSE-CAPILLARY: 188 mg/dL — AB (ref 65–99)
Glucose-Capillary: 135 mg/dL — ABNORMAL HIGH (ref 65–99)
Glucose-Capillary: 159 mg/dL — ABNORMAL HIGH (ref 65–99)
Glucose-Capillary: 134 mg/dL — ABNORMAL HIGH (ref 65–99)
Glucose-Capillary: 232 mg/dL — ABNORMAL HIGH (ref 65–99)

## 2016-03-25 MED ORDER — SODIUM CHLORIDE FLUSH 0.9 % IV SOLN
INTRAVENOUS | Status: AC
  Administered 2016-03-25: 16:00:00
  Filled 2016-03-25: qty 20

## 2016-03-25 NOTE — Progress Notes (Signed)
Patient ID: Joseph [name], male   DOB: November 21, 1925, 82 y.o.   MRN: [id number] [facility] NAME: Joseph [name]    MR#:  [id number]  DATE OF BIRTH:  1925/05/30  SUBJECTIVE:  S/p cholecystostomy drain placement POD#1 Doing well Fever last nite Wants to eat  REVIEW OF SYSTEMS:   Review of Systems  Constitutional: Negative for fever, chills and weight loss.  HENT: Negative for ear discharge, ear pain and nosebleeds.   Eyes: Negative for blurred vision, pain and discharge.  Respiratory: Negative for sputum production, shortness of breath, wheezing and stridor.   Cardiovascular: Negative for chest pain, palpitations, orthopnea and PND.  Gastrointestinal: Positive for abdominal pain. Negative for nausea, vomiting and diarrhea.  Genitourinary: Negative for urgency and frequency.  Musculoskeletal: Negative for back pain and joint pain.  Neurological: Negative for sensory change, speech change, focal weakness and weakness.  Psychiatric/Behavioral: Negative for depression and hallucinations. The patient is not nervous/anxious.   All other systems reviewed and are negative.  Tolerating Diet:soft diet Tolerating PT: Pending  DRUG ALLERGIES:  No Known Allergies  VITALS:  Blood pressure 122/46, pulse 66, temperature 98.3 F (36.8 C), temperature source Oral, resp. rate 18, height 5\' 7"  (1.702 m), weight 103.329 kg (227 lb 12.8 oz), SpO2 97 %.  PHYSICAL EXAMINATION:   Physical Exam  GENERAL:  82 y.o.-year-old patient lying in the bed with no acute distress.  EYES: Pupils equal, round, reactive to light and accommodation. No scleral icterus. Extraocular muscles intact.  HEENT: Head atraumatic, normocephalic. Oropharynx and nasopharynx clear.  NECK:  Supple, no jugular venous distention. No thyroid enlargement, no tenderness.  LUNGS: Normal breath sounds bilaterally, no wheezing, rales, rhonchi. No use of accessory muscles of  respiration.  CARDIOVASCULAR: S1, S2 normal. No murmurs, rubs, or gallops.  ABDOMEN: Soft, Right upper quadrant tender, nondistended. Bowel sounds present. No organomegaly or mass. Drain + EXTREMITIES: No cyanosis, clubbing or edema b/l.    NEUROLOGIC: Cranial nerves II through XII are intact. No focal Motor or sensory deficits b/l.   PSYCHIATRIC:  patient is alert and oriented x 3.  SKIN: No obvious rash, lesion, or ulcer.   LABORATORY PANEL:  CBC  Recent Labs Lab 03/24/16 0424  WBC 19.8*  HGB 11.0*  HCT 32.7*  PLT 176    Chemistries   Recent Labs Lab 03/24/16 0424  NA 134*  K 4.9  CL 101  CO2 26  GLUCOSE 136*  BUN 46*  CREATININE 1.96*  CALCIUM 8.3*  AST 24  ALT 20  ALKPHOS 56  BILITOT 1.0   Cardiac Enzymes  Recent Labs Lab 03/21/16 0730  TROPONINI <0.03   RADIOLOGY:  Nm Hepatobiliary Liver Func  03/23/2016  CLINICAL DATA:  Abdominal pain with gallstones. Elevated white blood cell count. EXAM: NUCLEAR MEDICINE HEPATOBILIARY IMAGING TECHNIQUE: Sequential images of the abdomen were obtained out to 60 minutes following intravenous administration of radiopharmaceutical. 4.0 mg IV morphine was administered IV after 60 minutes of imaging. RADIOPHARMACEUTICALS:  5.4 mCi Tc-50m Choletec IV additional 1.3 millicuries Choletec given as booster dose. COMPARISON:  CT 03/21/2016, ultrasound 03/21/2016 FINDINGS: There is prompt clearance of radiotracer from the blood pool and homogeneous uptake in the liver. Counts are evident insmall bowel by 20 minutes. The gallbladder does not fill by 1 hour. IV morphine was administered to constricts the sphincter of Odi. No filling defect of the gallbladder was evident following IV morphine administration. IMPRESSION: 1. Non filling of the  gallbladder after 90 minutes of imaging and administration of IV morphine is consistent with obstruction of the cystic duct and ACUTE CHOLECYSTITIS. 2. Patent common bile duct. Findings conveyed [name], RN on 03/23/2016  at16:50. Electronically Signed   By: [name] M.D.   On: 03/23/2016 16:50   [place] Intraoperative  03/24/2016  CLINICAL DATA:  Ultrasound was provided for use by the ordering physician, and a technical charge was applied by the performing facility.  No radiologist interpretation/professional services rendered.   Ir Perc Cholecystostomy  03/24/2016  INDICATION: 82 year old male with a history of abdominal pain, developing acute cholecystitis during admission. He has severe lung disease and cardiac disease. He has been referred for evaluation for percutaneous cholecystostomy. Before our procedure I have had a lengthy discussion with the family, including the possibility of indefinite drainage catheter. Percutaneous cholecystostomy is indicated EXAM: CHOLECYSTOSTOMY MEDICATIONS: Zosyn; The antibiotic was administered within an appropriate time frame prior to the initiation of the procedure. 4 mg IV Zofran. 25 mg IV Demerol ANESTHESIA/SEDATION: Moderate (conscious) sedation was employed during this procedure. A total of Versed 2.0 mg and Fentanyl 100 mcg was administered intravenously. Moderate Sedation Time: 25 minutes. The patient's level of consciousness and vital signs were monitored continuously by radiology nursing throughout the procedure under my direct supervision. FLUOROSCOPY TIME:  Fluoroscopy Time: 0 minutes 24 seconds COMPLICATIONS: None PROCEDURE: Informed written consent was obtained from the patient after a discussion of the risks, benefits and alternatives to treatment. Questions regarding the procedure were encouraged and answered. A timeout was performed prior to the initiation of the procedure. The right upper abdominal quadrant was prepped and draped in the usual sterile fashion, and a sterile drape was applied covering the operative field. Maximum barrier sterile technique with sterile gowns and gloves were used for the procedure. A timeout was performed prior to  the initiation of the procedure. Local anesthesia was provided with 1% lidocaine with epinephrine. Ultrasound scanning of the right upper quadrant demonstrates a markedly dilated gallbladder. Of note, the patient reported pain with ultrasound imaging over the gallbladder. Utilizing a transhepatic approach, a 22 gauge needle was advanced into the gallbladder under direct ultrasound guidance. An ultrasound image was saved for documentation purposes. Appropriate intraluminal puncture was confirmed with the efflux of bile and advancement of an 0.018 wire into the gallbladder lumen. The needle was exchanged for an [place] set. A small amount of contrast was injected to confirm appropriate intraluminal positioning. Over a Amplatz wire, a 10.2-French Cook cholecystomy tube was advanced into the gallbladder fossa, coiled and locked. Bile was aspirated and a small amount of contrast was injected as several post procedural spot radiographic images were obtained. The catheter was secured to the skin with suture, connected to a drainage bag and a dressing was placed. The patient tolerated the procedure well without immediate post procedural complication. No significant blood loss. IMPRESSION: Status post percutaneous cholecystostomy. Signed, Dulcy Fanny. Earleen Newport, DO [facility] Electronically Signed   By: Corrie Mckusick D.O.   On: 03/24/2016 15:32   ASSESSMENT AND PLAN:  82 year old male with a history of CAD/4 vessel CABG, diastolic heart failure and hyperlipidemia who presented with chest pain and abdominal pain and found to have diverticulitis. For further details please refer the H&P.  1. Acute cholecystitis Initial CT shows mild acute sigmoid diverticulitis with right upper chronic ultrasound showing gallstones but no acute cholecystitis. Continue IV Zosyn  HIDA scan positive for acute cholecystitis. s/p cholecystostomy tube  placed in through interventional  radiology 03/24/16 -he is not a candidate for general anesthesia and surgery -GB fluid culture pending -start po diet  2. Acute respiratory distress with hypoxia: Repeat chest x-ray showed no evidence of pneumonia or CHF. Oxygen has been weaned off.  3. CAD status post CABG: EKG was negative for acute change. Patient will continue aspirin, Coreg and Lipitor. torsemide is discontinued due to acute kidney injury.  Patient will follow-up with cardiology with planned cardiac catheterization in early May.  4. Chronic diastolic heart failure: - Torsemide has been discontinued due to acute kidney injury. -Continue Coreg.  5. Acute on chronic kidney disease stage II: - Creatinine initially improved and now has worsened due to ATN from problem #1. Restart IV fluids. Hold nephrotoxic agents.  6. Diabetes: Patient will continue outpatient regimen and ADA diet. Appreciate diabetes coordinator consultation  PT to see pt Case discussed with Care Management/Social Worker. Management plans discussed with the patient, family and they are in agreement.  CODE STATUS: Full  DVT Prophylaxis: Lovenox TOTAL TIME TAKING CARE OF THIS PATIENT 30 minutes.  >50% time spent on counselling and coordination of care patient and family  POSSIBLE D/C IN 2-3 DAYS, DEPENDING ON CLINICAL CONDITION.  Note: This dictation was prepared with Dragon dictation along with smaller phrase technology. Any transcriptional errors that result from this process are unintentional.  Abenezer Odonell M.D on 03/25/2016 at 8:36 AM  Between 7am to 6pm - Pager - [phone]  After 6pm go to [email or web address] - password [email or web address] [facility]  Office  [phone]  CC: Primary care physician; Wilhemena Durie, MD

## 2016-03-25 NOTE — Care Management Important Message (Signed)
Important Message  Patient Details  Name: Joseph [name] MRN: [id number] Date of Birth: September 06, 1925   Medicare Important Message Given:  Yes    Jolly Mango, RN 03/25/2016, 3:20 PM

## 2016-03-25 NOTE — Progress Notes (Signed)
Subjective: Complains of mild abdominal pain and pain at drain site.  Objective: Vital signs in last 24 hours: Temp:  [98.2 F (36.8 C)-101.1 F (38.4 C)] 98.4 F (36.9 C) (05/05 1243) Pulse Rate:  [66-121] 82 (05/05 1243) Resp:  [18-38] 18 (05/05 1243) BP: (105-175)/(46-120) 141/55 mmHg (05/05 1243) SpO2:  [91 %-99 %] 91 % (05/05 1243) Weight:  [227 lb 12.8 oz (103.329 kg)] 227 lb 12.8 oz (103.329 kg) (05/05 0509) Last BM Date: 03/21/16  Intake/Output from previous day: 05/04 0701 - 05/05 0700 In: -  Out: 415 [Urine:325; Drains:90] Intake/Output this shift:   PE: Abdomen: soft , mild tenderness. Drain:  Intact with bloody drainage.  Lab Results:   Recent Labs  03/23/16 0824 03/24/16 0424  WBC 25.5* 19.8*  HGB 11.1* 11.0*  HCT 32.7* 32.7*  PLT 188 176   BMET  Recent Labs  03/23/16 0824 03/24/16 0424  NA 133* 134*  K 5.3* 4.9  CL 100* 101  CO2 25 26  GLUCOSE 140* 136*  BUN 44* 46*  CREATININE 2.02* 1.96*  CALCIUM 8.3* 8.3*   PT/INR  Recent Labs  03/24/16 1015  LABPROT 17.0*  INR 1.37   ABG No results for input(s): PHART, HCO3 in the last 72 hours.  Invalid input(s): PCO2, PO2  Studies/Results: Nm Hepatobiliary Liver Func  03/23/2016  CLINICAL DATA:  Abdominal pain with gallstones. Elevated white blood cell count. EXAM: NUCLEAR MEDICINE HEPATOBILIARY IMAGING TECHNIQUE: Sequential images of the abdomen were obtained out to 60 minutes following intravenous administration of radiopharmaceutical. 4.0 mg IV morphine was administered IV after 60 minutes of imaging. RADIOPHARMACEUTICALS:  5.4 mCi Tc-31m Choletec IV additional 1.3 millicuries Choletec given as booster dose. COMPARISON:  CT 03/21/2016, ultrasound 03/21/2016 FINDINGS: There is prompt clearance of radiotracer from the blood pool and homogeneous uptake in the liver. Counts are evident insmall bowel by 20 minutes. The gallbladder does not fill by 1 hour. IV morphine was administered to  constricts the sphincter of Odi. No filling defect of the gallbladder was evident following IV morphine administration. IMPRESSION: 1. Non filling of the gallbladder after 90 minutes of imaging and administration of IV morphine is consistent with obstruction of the cystic duct and ACUTE CHOLECYSTITIS. 2. Patent common bile duct. Findings conveyed [name], RN on 03/23/2016  at16:50. Electronically Signed   By: [name] M.D.   On: 03/23/2016 16:50   [place] Intraoperative  03/24/2016  CLINICAL DATA:  Ultrasound was provided for use by the ordering physician, and a technical charge was applied by the performing facility.  No radiologist interpretation/professional services rendered.   Ir Perc Cholecystostomy  03/24/2016  INDICATION: 82 year old male with a history of abdominal pain, developing acute cholecystitis during admission. He has severe lung disease and cardiac disease. He has been referred for evaluation for percutaneous cholecystostomy. Before our procedure I have had a lengthy discussion with the family, including the possibility of indefinite drainage catheter. Percutaneous cholecystostomy is indicated EXAM: CHOLECYSTOSTOMY MEDICATIONS: Zosyn; The antibiotic was administered within an appropriate time frame prior to the initiation of the procedure. 4 mg IV Zofran. 25 mg IV Demerol ANESTHESIA/SEDATION: Moderate (conscious) sedation was employed during this procedure. A total of Versed 2.0 mg and Fentanyl 100 mcg was administered intravenously. Moderate Sedation Time: 25 minutes. The patient's level of consciousness and vital signs were monitored continuously by radiology nursing throughout the procedure under my direct supervision. FLUOROSCOPY TIME:  Fluoroscopy Time: 0 minutes 24 seconds COMPLICATIONS: None PROCEDURE: Informed written consent was obtained from  the patient after a discussion of the risks, benefits and alternatives to treatment. Questions regarding the procedure were encouraged  and answered. A timeout was performed prior to the initiation of the procedure. The right upper abdominal quadrant was prepped and draped in the usual sterile fashion, and a sterile drape was applied covering the operative field. Maximum barrier sterile technique with sterile gowns and gloves were used for the procedure. A timeout was performed prior to the initiation of the procedure. Local anesthesia was provided with 1% lidocaine with epinephrine. Ultrasound scanning of the right upper quadrant demonstrates a markedly dilated gallbladder. Of note, the patient reported pain with ultrasound imaging over the gallbladder. Utilizing a transhepatic approach, a 22 gauge needle was advanced into the gallbladder under direct ultrasound guidance. An ultrasound image was saved for documentation purposes. Appropriate intraluminal puncture was confirmed with the efflux of bile and advancement of an 0.018 wire into the gallbladder lumen. The needle was exchanged for an [place] set. A small amount of contrast was injected to confirm appropriate intraluminal positioning. Over a Amplatz wire, a 10.2-French Cook cholecystomy tube was advanced into the gallbladder fossa, coiled and locked. Bile was aspirated and a small amount of contrast was injected as several post procedural spot radiographic images were obtained. The catheter was secured to the skin with suture, connected to a drainage bag and a dressing was placed. The patient tolerated the procedure well without immediate post procedural complication. No significant blood loss. IMPRESSION: Status post percutaneous cholecystostomy. Signed, Dulcy Fanny. Earleen Newport, DO [facility] Electronically Signed   By: Corrie Mckusick D.O.   On: 03/24/2016 15:32    Anti-infectives: Anti-infectives    Start     Dose/Rate Route Frequency Ordered Stop   03/24/16 2200  piperacillin-tazobactam (ZOSYN) IVPB 3.375 g     3.375 g 12.5 mL/hr  over 240 Minutes Intravenous Every 8 hours 03/24/16 1305     03/24/16 1545  Levofloxacin (LEVAQUIN) IVPB 250 mg     250 mg 50 mL/hr over 60 Minutes Intravenous  Once 03/24/16 1543 03/24/16 1659   03/23/16 1730  piperacillin-tazobactam (ZOSYN) IVPB 3.375 g  Status:  Discontinued     3.375 g 12.5 mL/hr over 240 Minutes Intravenous Every 12 hours 03/23/16 1725 03/23/16 1727   03/23/16 1730  piperacillin-tazobactam (ZOSYN) IVPB 3.375 g  Status:  Discontinued     3.375 g 12.5 mL/hr over 240 Minutes Intravenous Every 12 hours 03/23/16 1727 03/24/16 1305   03/23/16 1421  metroNIDAZOLE (FLAGYL) IVPB 500 mg  Status:  Discontinued     500 mg 100 mL/hr over 60 Minutes Intravenous Every 8 hours 03/23/16 0951 03/23/16 1723   03/22/16 0000  ciprofloxacin (CIPRO) 500 MG tablet     500 mg Oral 2 times daily 03/22/16 1056     03/22/16 0000  metroNIDAZOLE (FLAGYL) 500 MG tablet     500 mg Oral 3 times daily 03/22/16 1056     03/21/16 2000  ciprofloxacin (CIPRO) IVPB 400 mg  Status:  Discontinued     400 mg 200 mL/hr over 60 Minutes Intravenous Every 12 hours 03/21/16 1745 03/23/16 1723   03/21/16 1830  metroNIDAZOLE (FLAGYL) IVPB 500 mg  Status:  Discontinued     500 mg 100 mL/hr over 60 Minutes Intravenous Every 12 hours 03/21/16 1747 03/23/16 0951   03/21/16 1600  ciprofloxacin (CIPRO) IVPB 400 mg  Status:  Discontinued     400 mg 200 mL/hr over 60 Minutes Intravenous Every 12  hours 03/21/16 1547 03/21/16 1745   03/21/16 1600  metroNIDAZOLE (FLAGYL) IVPB 500 mg  Status:  Discontinued     500 mg 100 mL/hr over 60 Minutes Intravenous Every 12 hours 03/21/16 1547 03/21/16 1746   03/21/16 1315  piperacillin-tazobactam (ZOSYN) IVPB 3.375 g     3.375 g 100 mL/hr over 30 Minutes Intravenous  Once 03/21/16 1303 03/21/16 1435      Assessment/Plan: s/p Percutaneous cholecystostomy tube placement.  Had a fever after tube placement which has resolved.  Bloody drainage from chole tube.  No growth on cultured  bile. Patient will need a drain injection in 6 weeks.  Will decide as outpatient if patient will keep a chole tube indefinitely or try to remove at a later date.  Had long discussion with family about the pros and cons of removing a chole tube with known gallstones. Will flush tube today due to bloody output.   LOS: 4 days    Joseph [name] RYAN 03/25/2016

## 2016-03-25 NOTE — Progress Notes (Signed)
CC: Acute calculus cholecystitis Subjective: Issue and feels a little bit better but he still has persistent weakness. Some abdominal pain. 90 cc out from the cholecystostomy tube and he did have 101.1 temperature last night  Objective: Vital signs in last 24 hours: Temp:  [98.2 F (36.8 C)-101.1 F (38.4 C)] 98.4 F (36.9 C) (05/05 1243) Pulse Rate:  [66-121] 82 (05/05 1243) Resp:  [18-38] 18 (05/05 1243) BP: (105-175)/(46-120) 141/55 mmHg (05/05 1243) SpO2:  [91 %-99 %] 91 % (05/05 1243) Weight:  [103.329 kg (227 lb 12.8 oz)] 103.329 kg (227 lb 12.8 oz) (05/05 0509) Last BM Date: 03/21/16  Intake/Output from previous day: 05/04 0701 - 05/05 0700 In: -  Out: 415 [Urine:325; Drains:90] Intake/Output this shift:    Physical exam: No acute distress, laying in bed. Abd: soft, mild TTP RUP, no peritonitis, drain in place w bile and serosanguineous dc  Ext: edema, well perfused  lab Results: CBC   Recent Labs  03/23/16 0824 03/24/16 0424  WBC 25.5* 19.8*  HGB 11.1* 11.0*  HCT 32.7* 32.7*  PLT 188 176   BMET  Recent Labs  03/23/16 0824 03/24/16 0424  NA 133* 134*  K 5.3* 4.9  CL 100* 101  CO2 25 26  GLUCOSE 140* 136*  BUN 44* 46*  CREATININE 2.02* 1.96*  CALCIUM 8.3* 8.3*   PT/INR  Recent Labs  03/24/16 1015  LABPROT 17.0*  INR 1.37   ABG No results for input(s): PHART, HCO3 in the last 72 hours.  Invalid input(s): PCO2, PO2  Studies/Results: Nm Hepatobiliary Liver Func  03/23/2016  CLINICAL DATA:  Abdominal pain with gallstones. Elevated white blood cell count. EXAM: NUCLEAR MEDICINE HEPATOBILIARY IMAGING TECHNIQUE: Sequential images of the abdomen were obtained out to 60 minutes following intravenous administration of radiopharmaceutical. 4.0 mg IV morphine was administered IV after 60 minutes of imaging. RADIOPHARMACEUTICALS:  5.4 mCi Tc-77m Choletec IV additional 1.3 millicuries Choletec given as booster dose. COMPARISON:  CT 03/21/2016, ultrasound  03/21/2016 FINDINGS: There is prompt clearance of radiotracer from the blood pool and homogeneous uptake in the liver. Counts are evident insmall bowel by 20 minutes. The gallbladder does not fill by 1 hour. IV morphine was administered to constricts the sphincter of Odi. No filling defect of the gallbladder was evident following IV morphine administration. IMPRESSION: 1. Non filling of the gallbladder after 90 minutes of imaging and administration of IV morphine is consistent with obstruction of the cystic duct and ACUTE CHOLECYSTITIS. 2. Patent common bile duct. Findings conveyed [name], RN on 03/23/2016  at16:50. Electronically Signed   By: [name] M.D.   On: 03/23/2016 16:50   [place] Intraoperative  03/24/2016  CLINICAL DATA:  Ultrasound was provided for use by the ordering physician, and a technical charge was applied by the performing facility.  No radiologist interpretation/professional services rendered.   Ir Perc Cholecystostomy  03/24/2016  INDICATION: 82 year old male with a history of abdominal pain, developing acute cholecystitis during admission. He has severe lung disease and cardiac disease. He has been referred for evaluation for percutaneous cholecystostomy. Before our procedure I have had a lengthy discussion with the family, including the possibility of indefinite drainage catheter. Percutaneous cholecystostomy is indicated EXAM: CHOLECYSTOSTOMY MEDICATIONS: Zosyn; The antibiotic was administered within an appropriate time frame prior to the initiation of the procedure. 4 mg IV Zofran. 25 mg IV Demerol ANESTHESIA/SEDATION: Moderate (conscious) sedation was employed during this procedure. A total of Versed 2.0 mg and Fentanyl 100 mcg was administered intravenously. Moderate  Sedation Time: 25 minutes. The patient's level of consciousness and vital signs were monitored continuously by radiology nursing throughout the procedure under my direct supervision. FLUOROSCOPY TIME:   Fluoroscopy Time: 0 minutes 24 seconds COMPLICATIONS: None PROCEDURE: Informed written consent was obtained from the patient after a discussion of the risks, benefits and alternatives to treatment. Questions regarding the procedure were encouraged and answered. A timeout was performed prior to the initiation of the procedure. The right upper abdominal quadrant was prepped and draped in the usual sterile fashion, and a sterile drape was applied covering the operative field. Maximum barrier sterile technique with sterile gowns and gloves were used for the procedure. A timeout was performed prior to the initiation of the procedure. Local anesthesia was provided with 1% lidocaine with epinephrine. Ultrasound scanning of the right upper quadrant demonstrates a markedly dilated gallbladder. Of note, the patient reported pain with ultrasound imaging over the gallbladder. Utilizing a transhepatic approach, a 22 gauge needle was advanced into the gallbladder under direct ultrasound guidance. An ultrasound image was saved for documentation purposes. Appropriate intraluminal puncture was confirmed with the efflux of bile and advancement of an 0.018 wire into the gallbladder lumen. The needle was exchanged for an [place] set. A small amount of contrast was injected to confirm appropriate intraluminal positioning. Over a Amplatz wire, a 10.2-French Cook cholecystomy tube was advanced into the gallbladder fossa, coiled and locked. Bile was aspirated and a small amount of contrast was injected as several post procedural spot radiographic images were obtained. The catheter was secured to the skin with suture, connected to a drainage bag and a dressing was placed. The patient tolerated the procedure well without immediate post procedural complication. No significant blood loss. IMPRESSION: Status post percutaneous cholecystostomy. Signed, Dulcy Fanny. Earleen Newport, DO [facility]  Electronically Signed   By: Corrie Mckusick D.O.   On: 03/24/2016 15:32    Anti-infectives: Anti-infectives    Start     Dose/Rate Route Frequency Ordered Stop   03/24/16 2200  piperacillin-tazobactam (ZOSYN) IVPB 3.375 g     3.375 g 12.5 mL/hr over 240 Minutes Intravenous Every 8 hours 03/24/16 1305     03/24/16 1545  Levofloxacin (LEVAQUIN) IVPB 250 mg     250 mg 50 mL/hr over 60 Minutes Intravenous  Once 03/24/16 1543 03/24/16 1659   03/23/16 1730  piperacillin-tazobactam (ZOSYN) IVPB 3.375 g  Status:  Discontinued     3.375 g 12.5 mL/hr over 240 Minutes Intravenous Every 12 hours 03/23/16 1725 03/23/16 1727   03/23/16 1730  piperacillin-tazobactam (ZOSYN) IVPB 3.375 g  Status:  Discontinued     3.375 g 12.5 mL/hr over 240 Minutes Intravenous Every 12 hours 03/23/16 1727 03/24/16 1305   03/23/16 1421  metroNIDAZOLE (FLAGYL) IVPB 500 mg  Status:  Discontinued     500 mg 100 mL/hr over 60 Minutes Intravenous Every 8 hours 03/23/16 0951 03/23/16 1723   03/22/16 0000  ciprofloxacin (CIPRO) 500 MG tablet     500 mg Oral 2 times daily 03/22/16 1056     03/22/16 0000  metroNIDAZOLE (FLAGYL) 500 MG tablet     500 mg Oral 3 times daily 03/22/16 1056     03/21/16 2000  ciprofloxacin (CIPRO) IVPB 400 mg  Status:  Discontinued     400 mg 200 mL/hr over 60 Minutes Intravenous Every 12 hours 03/21/16 1745 03/23/16 1723   03/21/16 1830  metroNIDAZOLE (FLAGYL) IVPB 500 mg  Status:  Discontinued  500 mg 100 mL/hr over 60 Minutes Intravenous Every 12 hours 03/21/16 1747 03/23/16 0951   03/21/16 1600  ciprofloxacin (CIPRO) IVPB 400 mg  Status:  Discontinued     400 mg 200 mL/hr over 60 Minutes Intravenous Every 12 hours 03/21/16 1547 03/21/16 1745   03/21/16 1600  metroNIDAZOLE (FLAGYL) IVPB 500 mg  Status:  Discontinued     500 mg 100 mL/hr over 60 Minutes Intravenous Every 12 hours 03/21/16 1547 03/21/16 1746   03/21/16 1315  piperacillin-tazobactam (ZOSYN) IVPB 3.375 g     3.375 g 100  mL/hr over 30 Minutes Intravenous  Once 03/21/16 1303 03/21/16 1435      Assessment/Plan:Cholecystitis and an 82 year old with multiple comorbidities including coronary artery disease, heart failure, morbid obesity now status post cholecystostomy tube. Recommend to continue antibiotics, continue drain placement and advance diet. I Surgical indication at this time. I do not think that he might be appropriate surgical candidate at any time. Discussed this with the family and they do understand. We'll continue to follow him  Caroleen Hamman, MD, [facility]  03/25/2016

## 2016-03-25 NOTE — Progress Notes (Signed)
Physical Therapy Treatment Patient Details Name: GIACOMO ILSLEY MRN: [id number] DOB: 02/14/1925 Today's Date: 03/25/2016    History of Present Illness Pt admitted for acute respiratory failure with hypoxia. While in hospital, pt received a cholecystotomy drain placement on 5/4. Pt with history of DM, HTN, CAD and has cardiac cath schedule for 03/31/16.    PT Comments    Pt is progressing towards goals. Since last PT session, pt received cholecystostomy drain placement on 5/4. Pt stated experiencing no pain, just soreness from the surgery. Pt able to perform bed mobility with mod assist and use of bed rails. Pt able to transfer from EOB using RW and mod assist. Pt able to ambulate approx 350 ft using RW and CGA. After ambulation, pts O2 sat at 86% on room air. O2 went up to 91% after 1 min rest break. Pt performed supine there-ex on B LE with no assist. Pt demonstrates deficits in strength, mobility and endurance. Pt would benefit from further PT to address deficits; recommend pt receive home health PT after discharge from acute hospitalization.   Follow Up Recommendations  Home health PT     Equipment Recommendations       Recommendations for Other Services       Precautions / Restrictions Precautions Precautions: Fall Restrictions Weight Bearing Restrictions: No    Mobility  Bed Mobility Overal bed mobility: Needs Assistance Bed Mobility: Supine to Sit     Supine to sit: Mod assist     General bed mobility comments: Pt required max assist and use of bed rails to sit on EOB. Pt required increased time and assist with B LE and to upright trunk. Pt required cues regarding hand placement during transfer.   Transfers Overall transfer level: Needs assistance Equipment used: Rolling walker (2 wheeled) Transfers: Sit to/from Stand Sit to Stand: Mod assist         General transfer comment: Pt able to transfer from EOB using RW and mod assist. Pt provided cues regarding hand  placement during transfer. Pt required increased time to upright posture. Pt able to maintain good standing balance once upright.   Ambulation/Gait Ambulation/Gait assistance: Min guard Ambulation Distance (Feet): 350 Feet Assistive device: Rolling walker (2 wheeled) Gait Pattern/deviations: Step-through pattern;Trunk flexed Gait velocity: improved   General Gait Details: Pt able to ambulate approx 360 ft using RW and CGA for safety. Pt demonstrated fairly good gait speed with step-through gait pattern. Pt cued on walker placement during ambulation. Pt fatigued after ambulation. O2 sat measured at 86% on room air after ambulation, increased to 91% after 1 min of rest. RN notified about O2 sats.    Stairs            Wheelchair Mobility    Modified Rankin (Stroke Patients Only)       Balance                                    Cognition Arousal/Alertness: Awake/alert Behavior During Therapy: WFL for tasks assessed/performed Overall Cognitive Status: Within Functional Limits for tasks assessed                      Exercises Other Exercises Other Exercises: Pt performed supine ther-ex on B LE including ankle pumps, SAQ, quad sets, and hip ab/ad with no assist. All ther-ex performed x12 reps.     General Comments  Pertinent Vitals/Pain Pain Assessment: No/denies pain Pain Descriptors / Indicators: Sore    Home Living                      Prior Function            PT Goals (current goals can now be found in the care plan section) Acute Rehab PT Goals Patient Stated Goal: to get stronger PT Goal Formulation: With patient Time For Goal Achievement: 04/05/16 Potential to Achieve Goals: Good Progress towards PT goals: Progressing toward goals    Frequency  Min 2X/week    PT Plan Current plan remains appropriate    Co-evaluation             End of Session Equipment Utilized During Treatment: Gait belt Activity  Tolerance: Patient tolerated treatment well Patient left: in chair;with call bell/phone within reach;with chair alarm set;with family/visitor present     Time: 1407-1430 PT Time Calculation (min) (ACUTE ONLY): 23 min  Charges:                       G Codes:      Sherral Hammers Apr 07, 2016, 3:40 PM M. Barnett Abu, SPT

## 2016-03-25 NOTE — Progress Notes (Signed)
Pharmacy Antibiotic Note  Joseph [name] is a 82 y.o. male admitted on 03/21/2016 with cholecystitis.  Pharmacy has been consulted for Zosyn dosing.  Plan: Continue Zosyn 3.375 gm IV q8h per EI protocol based on est CrCl~29.8 mL/min  Height: 5\' 7"  (170.2 cm) Weight: 227 lb 12.8 oz (103.329 kg) IBW/kg (Calculated) : 66.1  Temp (24hrs), Avg:99.3 F (37.4 C), Min:98.2 F (36.8 C), Max:101.1 F (38.4 C)   Recent Labs Lab 03/18/16 1051 03/21/16 0730 03/22/16 0426 03/23/16 0824 03/24/16 0424  WBC 8.6 7.4 15.3* 25.5* 19.8*  CREATININE 1.41* 1.63* 1.60* 2.02* 1.96*    Estimated Creatinine Clearance: 29.8 mL/min (by C-G formula based on Cr of 1.96).    No Known Allergies  Antimicrobials this admission: Anti-infectives    Start     Dose/Rate Route Frequency Ordered Stop   03/24/16 2200  piperacillin-tazobactam (ZOSYN) IVPB 3.375 g     3.375 g 12.5 mL/hr over 240 Minutes Intravenous Every 8 hours 03/24/16 1305     03/24/16 1545  Levofloxacin (LEVAQUIN) IVPB 250 mg     250 mg 50 mL/hr over 60 Minutes Intravenous  Once 03/24/16 1543 03/24/16 1659   03/23/16 1730  piperacillin-tazobactam (ZOSYN) IVPB 3.375 g  Status:  Discontinued     3.375 g 12.5 mL/hr over 240 Minutes Intravenous Every 12 hours 03/23/16 1725 03/23/16 1727   03/23/16 1730  piperacillin-tazobactam (ZOSYN) IVPB 3.375 g  Status:  Discontinued     3.375 g 12.5 mL/hr over 240 Minutes Intravenous Every 12 hours 03/23/16 1727 03/24/16 1305   03/23/16 1421  metroNIDAZOLE (FLAGYL) IVPB 500 mg  Status:  Discontinued     500 mg 100 mL/hr over 60 Minutes Intravenous Every 8 hours 03/23/16 0951 03/23/16 1723   03/22/16 0000  ciprofloxacin (CIPRO) 500 MG tablet     500 mg Oral 2 times daily 03/22/16 1056     03/22/16 0000  metroNIDAZOLE (FLAGYL) 500 MG tablet     500 mg Oral 3 times daily 03/22/16 1056     03/21/16 2000  ciprofloxacin (CIPRO) IVPB 400 mg  Status:  Discontinued     400 mg 200 mL/hr over 60 Minutes  Intravenous Every 12 hours 03/21/16 1745 03/23/16 1723   03/21/16 1830  metroNIDAZOLE (FLAGYL) IVPB 500 mg  Status:  Discontinued     500 mg 100 mL/hr over 60 Minutes Intravenous Every 12 hours 03/21/16 1747 03/23/16 0951   03/21/16 1600  ciprofloxacin (CIPRO) IVPB 400 mg  Status:  Discontinued     400 mg 200 mL/hr over 60 Minutes Intravenous Every 12 hours 03/21/16 1547 03/21/16 1745   03/21/16 1600  metroNIDAZOLE (FLAGYL) IVPB 500 mg  Status:  Discontinued     500 mg 100 mL/hr over 60 Minutes Intravenous Every 12 hours 03/21/16 1547 03/21/16 1746   03/21/16 1315  piperacillin-tazobactam (ZOSYN) IVPB 3.375 g     3.375 g 100 mL/hr over 30 Minutes Intravenous  Once 03/21/16 1303 03/21/16 1435       Microbiology results: Results for orders placed or performed during the hospital encounter of 03/21/16  Culture, body fluid-bottle     Status: None (Preliminary result)   Collection Time: 03/24/16  3:12 PM  Result Value Ref Range Status   Specimen Description DRAINAGE CHOLE  Final   Special Requests NONE  Final   Culture NO GROWTH < 24 HOURS  Final   Report Status PENDING  Incomplete    Thank you for allowing pharmacy to be a part of this patient's  care.  Adyn Hoes G 03/25/2016 10:15 AM

## 2016-03-26 LAB — MAGNESIUM: Magnesium: 2.5 mg/dL — ABNORMAL HIGH (ref 1.7–2.4)

## 2016-03-26 LAB — BASIC METABOLIC PANEL
ANION GAP: 10 (ref 5–15)
BUN: 57 mg/dL — ABNORMAL HIGH (ref 6–20)
CO2: 25 mmol/L (ref 22–32)
Calcium: 8.3 mg/dL — ABNORMAL LOW (ref 8.9–10.3)
Chloride: 103 mmol/L (ref 101–111)
Creatinine, Ser: 1.56 mg/dL — ABNORMAL HIGH (ref 0.61–1.24)
GFR calc Af Amer: 44 mL/min — ABNORMAL LOW (ref >60)
GFR, EST NON AFRICAN AMERICAN: 38 mL/min — AB (ref >60)
Glucose, Bld: 143 mg/dL — ABNORMAL HIGH (ref 65–99)
POTASSIUM: 4.1 mmol/L (ref 3.5–5.1)
SODIUM: 138 mmol/L (ref 135–145)

## 2016-03-26 LAB — GLUCOSE, CAPILLARY
GLUCOSE-CAPILLARY: 260 mg/dL — AB (ref 65–99)
Glucose-Capillary: 131 mg/dL — ABNORMAL HIGH (ref 65–99)
Glucose-Capillary: 220 mg/dL — ABNORMAL HIGH (ref 65–99)
Glucose-Capillary: 233 mg/dL — ABNORMAL HIGH (ref 65–99)

## 2016-03-26 LAB — CBC
HCT: 31.8 % — ABNORMAL LOW (ref 40.0–52.0)
Hemoglobin: 10.6 g/dL — ABNORMAL LOW (ref 13.0–18.0)
MCH: 30.4 pg (ref 26.0–34.0)
MCHC: 33.2 g/dL (ref 32.0–36.0)
MCV: 91.3 fL (ref 80.0–100.0)
PLATELETS: 181 10*3/uL (ref 150–440)
RBC: 3.49 MIL/uL — AB (ref 4.40–5.90)
RDW: 14.4 % (ref 11.5–14.5)
WBC: 12.1 10*3/uL — AB (ref 3.8–10.6)

## 2016-03-26 NOTE — Plan of Care (Signed)
Problem: Activity: Goal: Risk for activity intolerance will decrease Outcome: Progressing Patient up in chair

## 2016-03-26 NOTE — Progress Notes (Signed)
CC: Acute calculus cholecystitis Subjective: Issue and feels a little bit better but he still has persistent weakness. Some abdominal pain around drain site, improving.  Denies N/V, denies F/C. 80 cc bilious output out from the cholecystostomy tube  Objective: Vital signs in last 24 hours: Temp:  [98.4 F (36.9 C)-98.6 F (37 C)] 98.6 F (37 C) (05/06 0430) Pulse Rate:  [70-82] 70 (05/06 0758) Resp:  [18] 18 (05/06 0430) BP: (132-141)/(54-60) 134/58 mmHg (05/06 0758) SpO2:  [90 %-91 %] 90 % (05/06 0430) Weight:  [230 lb 6.4 oz (104.509 kg)] 230 lb 6.4 oz (104.509 kg) (05/06 0500) Last BM Date: 03/25/16  Intake/Output from previous day: 05/05 0701 - 05/06 0700 In: 440 [P.O.:340; IV Piggyback:100] Out: 80 [Drains:80] Intake/Output this shift:    Physical exam: No acute distress, laying in bed. Abd: soft, mild TTP RUP, no peritonitis, drain in place w bile and serosanguineous dc  Ext: edema, well perfused  lab Results: CBC   Recent Labs  03/24/16 0424 03/26/16 0453  WBC 19.8* 12.1*  HGB 11.0* 10.6*  HCT 32.7* 31.8*  PLT 176 181   BMET  Recent Labs  03/24/16 0424 03/26/16 0453  NA 134* 138  K 4.9 4.1  CL 101 103  CO2 26 25  GLUCOSE 136* 143*  BUN 46* 57*  CREATININE 1.96* 1.56*  CALCIUM 8.3* 8.3*   PT/INR  Recent Labs  03/24/16 1015  LABPROT 17.0*  INR 1.37   ABG No results for input(s): PHART, HCO3 in the last 72 hours.  Invalid input(s): PCO2, PO2  Studies/Results: [place] Intraoperative  03/24/2016  CLINICAL DATA:  Ultrasound was provided for use by the ordering physician, and a technical charge was applied by the performing facility.  No radiologist interpretation/professional services rendered.   Ir Perc Cholecystostomy  03/24/2016  INDICATION: 82 year old male with a history of abdominal pain, developing acute cholecystitis during admission. He has severe lung disease and cardiac disease. He has been referred for evaluation for percutaneous  cholecystostomy. Before our procedure I have had a lengthy discussion with the family, including the possibility of indefinite drainage catheter. Percutaneous cholecystostomy is indicated EXAM: CHOLECYSTOSTOMY MEDICATIONS: Zosyn; The antibiotic was administered within an appropriate time frame prior to the initiation of the procedure. 4 mg IV Zofran. 25 mg IV Demerol ANESTHESIA/SEDATION: Moderate (conscious) sedation was employed during this procedure. A total of Versed 2.0 mg and Fentanyl 100 mcg was administered intravenously. Moderate Sedation Time: 25 minutes. The patient's level of consciousness and vital signs were monitored continuously by radiology nursing throughout the procedure under my direct supervision. FLUOROSCOPY TIME:  Fluoroscopy Time: 0 minutes 24 seconds COMPLICATIONS: None PROCEDURE: Informed written consent was obtained from the patient after a discussion of the risks, benefits and alternatives to treatment. Questions regarding the procedure were encouraged and answered. A timeout was performed prior to the initiation of the procedure. The right upper abdominal quadrant was prepped and draped in the usual sterile fashion, and a sterile drape was applied covering the operative field. Maximum barrier sterile technique with sterile gowns and gloves were used for the procedure. A timeout was performed prior to the initiation of the procedure. Local anesthesia was provided with 1% lidocaine with epinephrine. Ultrasound scanning of the right upper quadrant demonstrates a markedly dilated gallbladder. Of note, the patient reported pain with ultrasound imaging over the gallbladder. Utilizing a transhepatic approach, a 22 gauge needle was advanced into the gallbladder under direct ultrasound guidance. An ultrasound image was saved for documentation purposes. Appropriate  intraluminal puncture was confirmed with the efflux of bile and advancement of an 0.018 wire into the gallbladder lumen. The needle was  exchanged for an [place] set. A small amount of contrast was injected to confirm appropriate intraluminal positioning. Over a Amplatz wire, a 10.2-French Cook cholecystomy tube was advanced into the gallbladder fossa, coiled and locked. Bile was aspirated and a small amount of contrast was injected as several post procedural spot radiographic images were obtained. The catheter was secured to the skin with suture, connected to a drainage bag and a dressing was placed. The patient tolerated the procedure well without immediate post procedural complication. No significant blood loss. IMPRESSION: Status post percutaneous cholecystostomy. Signed, Dulcy Fanny. Earleen Newport, DO [facility] Radiology Electronically Signed   By: Corrie Mckusick D.O.   On: 03/24/2016 15:32    Anti-infectives: Anti-infectives    Start     Dose/Rate Route Frequency Ordered Stop   03/24/16 2200  piperacillin-tazobactam (ZOSYN) IVPB 3.375 g     3.375 g 12.5 mL/hr over 240 Minutes Intravenous Every 8 hours 03/24/16 1305     03/24/16 1545  Levofloxacin (LEVAQUIN) IVPB 250 mg     250 mg 50 mL/hr over 60 Minutes Intravenous  Once 03/24/16 1543 03/24/16 1659   03/23/16 1730  piperacillin-tazobactam (ZOSYN) IVPB 3.375 g  Status:  Discontinued     3.375 g 12.5 mL/hr over 240 Minutes Intravenous Every 12 hours 03/23/16 1725 03/23/16 1727   03/23/16 1730  piperacillin-tazobactam (ZOSYN) IVPB 3.375 g  Status:  Discontinued     3.375 g 12.5 mL/hr over 240 Minutes Intravenous Every 12 hours 03/23/16 1727 03/24/16 1305   03/23/16 1421  metroNIDAZOLE (FLAGYL) IVPB 500 mg  Status:  Discontinued     500 mg 100 mL/hr over 60 Minutes Intravenous Every 8 hours 03/23/16 0951 03/23/16 1723   03/22/16 0000  ciprofloxacin (CIPRO) 500 MG tablet     500 mg Oral 2 times daily 03/22/16 1056     03/22/16 0000  metroNIDAZOLE (FLAGYL) 500 MG tablet     500 mg Oral 3 times daily 03/22/16 1056     03/21/16 2000   ciprofloxacin (CIPRO) IVPB 400 mg  Status:  Discontinued     400 mg 200 mL/hr over 60 Minutes Intravenous Every 12 hours 03/21/16 1745 03/23/16 1723   03/21/16 1830  metroNIDAZOLE (FLAGYL) IVPB 500 mg  Status:  Discontinued     500 mg 100 mL/hr over 60 Minutes Intravenous Every 12 hours 03/21/16 1747 03/23/16 0951   03/21/16 1600  ciprofloxacin (CIPRO) IVPB 400 mg  Status:  Discontinued     400 mg 200 mL/hr over 60 Minutes Intravenous Every 12 hours 03/21/16 1547 03/21/16 1745   03/21/16 1600  metroNIDAZOLE (FLAGYL) IVPB 500 mg  Status:  Discontinued     500 mg 100 mL/hr over 60 Minutes Intravenous Every 12 hours 03/21/16 1547 03/21/16 1746   03/21/16 1315  piperacillin-tazobactam (ZOSYN) IVPB 3.375 g     3.375 g 100 mL/hr over 30 Minutes Intravenous  Once 03/21/16 1303 03/21/16 1435      Assessment/Plan:Cholecystitis and an 82 year old with multiple comorbidities including coronary artery disease, heart failure, morbid obesity now status post cholecystostomy tube.   Recommend to continue antibiotics, continue drain placement and diet. Ok to d/c home with drain in place once tolerating diet, pain controlled, WBC resolved    03/26/2016

## 2016-03-26 NOTE — Progress Notes (Signed)
Patient ID: OWAIS ARMADA, male   DOB: 03-Jun-1925, 82 y.o.   MRN: [id number] [facility] NAME: Joseph [name]    MR#:  [id number]  DATE OF BIRTH:  22-Oct-1925  SUBJECTIVE:  S/p cholecystostomy drain placement POD#2 He tolerated diet. No complaint.  REVIEW OF SYSTEMS:   Review of Systems  Constitutional: Negative for fever, chills and weight loss.  HENT: Negative for ear discharge, ear pain and nosebleeds.   Eyes: Negative for blurred vision, pain and discharge.  Respiratory: Negative for sputum production, shortness of breath, wheezing and stridor.   Cardiovascular: Negative for chest pain, palpitations, orthopnea and PND.  Gastrointestinal: No abdominal pain. Negative for nausea, vomiting and diarrhea.  Genitourinary: Negative for urgency and frequency.  Musculoskeletal: Negative for back pain and joint pain.  Neurological: Negative for sensory change, speech change, focal weakness and weakness.  Psychiatric/Behavioral: Negative for depression and hallucinations. The patient is not nervous/anxious.   All other systems reviewed and are negative.  Tolerating Diet:soft diet Tolerating PT: Pending  DRUG ALLERGIES:  No Known Allergies  VITALS:  Blood pressure 121/50, pulse 66, temperature 98.4 F (36.9 C), temperature source Oral, resp. rate 18, height 5\' 7"  (1.702 m), weight 104.509 kg (230 lb 6.4 oz), SpO2 94 %.  PHYSICAL EXAMINATION:   Physical Exam  GENERAL:  82 y.o.-year-old patient lying in the bed with no acute distress.  EYES: Pupils equal, round, reactive to light and accommodation. No scleral icterus. Extraocular muscles intact.  HEENT: Head atraumatic, normocephalic. Oropharynx and nasopharynx clear.  NECK:  Supple, no jugular venous distention. No thyroid enlargement, no tenderness.  LUNGS: Normal breath sounds bilaterally, no wheezing, rales, rhonchi. No use of accessory muscles of respiration.   CARDIOVASCULAR: S1, S2 normal. No murmurs, rubs, or gallops.  ABDOMEN: Soft, no tenderness, nondistended. Bowel sounds present. No organomegaly or mass. Drain + EXTREMITIES: No cyanosis, clubbing or edema b/l.    NEUROLOGIC: Cranial nerves II through XII are intact. No focal Motor or sensory deficits b/l.   PSYCHIATRIC:  patient is alert and oriented x 3.  SKIN: No obvious rash, lesion, or ulcer.   LABORATORY PANEL:  CBC  Recent Labs Lab 03/26/16 0453  WBC 12.1*  HGB 10.6*  HCT 31.8*  PLT 181    Chemistries   Recent Labs Lab 03/24/16 0424 03/26/16 0453  NA 134* 138  K 4.9 4.1  CL 101 103  CO2 26 25  GLUCOSE 136* 143*  BUN 46* 57*  CREATININE 1.96* 1.56*  CALCIUM 8.3* 8.3*  MG  --  2.5*  AST 24  --   ALT 20  --   ALKPHOS 56  --   BILITOT 1.0  --    Cardiac Enzymes  Recent Labs Lab 03/21/16 0730  TROPONINI <0.03   RADIOLOGY:  [place] Intraoperative  03/24/2016  CLINICAL DATA:  Ultrasound was provided for use by the ordering physician, and a technical charge was applied by the performing facility.  No radiologist interpretation/professional services rendered.   Ir Perc Cholecystostomy  03/24/2016  INDICATION: 82 year old male with a history of abdominal pain, developing acute cholecystitis during admission. He has severe lung disease and cardiac disease. He has been referred for evaluation for percutaneous cholecystostomy. Before our procedure I have had a lengthy discussion with the family, including the possibility of indefinite drainage catheter. Percutaneous cholecystostomy is indicated EXAM: CHOLECYSTOSTOMY MEDICATIONS: Zosyn; The antibiotic was administered within an appropriate time frame prior to the initiation of  the procedure. 4 mg IV Zofran. 25 mg IV Demerol ANESTHESIA/SEDATION: Moderate (conscious) sedation was employed during this procedure. A total of Versed 2.0 mg and Fentanyl 100 mcg was administered intravenously. Moderate Sedation Time: 25 minutes. The  patient's level of consciousness and vital signs were monitored continuously by radiology nursing throughout the procedure under my direct supervision. FLUOROSCOPY TIME:  Fluoroscopy Time: 0 minutes 24 seconds COMPLICATIONS: None PROCEDURE: Informed written consent was obtained from the patient after a discussion of the risks, benefits and alternatives to treatment. Questions regarding the procedure were encouraged and answered. A timeout was performed prior to the initiation of the procedure. The right upper abdominal quadrant was prepped and draped in the usual sterile fashion, and a sterile drape was applied covering the operative field. Maximum barrier sterile technique with sterile gowns and gloves were used for the procedure. A timeout was performed prior to the initiation of the procedure. Local anesthesia was provided with 1% lidocaine with epinephrine. Ultrasound scanning of the right upper quadrant demonstrates a markedly dilated gallbladder. Of note, the patient reported pain with ultrasound imaging over the gallbladder. Utilizing a transhepatic approach, a 22 gauge needle was advanced into the gallbladder under direct ultrasound guidance. An ultrasound image was saved for documentation purposes. Appropriate intraluminal puncture was confirmed with the efflux of bile and advancement of an 0.018 wire into the gallbladder lumen. The needle was exchanged for an [place] set. A small amount of contrast was injected to confirm appropriate intraluminal positioning. Over a Amplatz wire, a 10.2-French Cook cholecystomy tube was advanced into the gallbladder fossa, coiled and locked. Bile was aspirated and a small amount of contrast was injected as several post procedural spot radiographic images were obtained. The catheter was secured to the skin with suture, connected to a drainage bag and a dressing was placed. The patient tolerated the procedure well without immediate post procedural complication. No  significant blood loss. IMPRESSION: Status post percutaneous cholecystostomy. Signed, Dulcy Fanny. Earleen Newport, DO [facility] Radiology Electronically Signed   By: Corrie Mckusick D.O.   On: 03/24/2016 15:32   ASSESSMENT AND PLAN:  82 year old male with a history of CAD/4 vessel CABG, diastolic heart failure and hyperlipidemia who presented with chest pain and abdominal pain and found to have diverticulitis. For further details please refer the H&P.  1. Acute cholecystitis Initial CT shows mild acute sigmoid diverticulitis with right upper chronic ultrasound showing gallstones but no acute cholecystitis. Continue IV Zosyn  HIDA scan positive for acute cholecystitis. s/p cholecystostomy tube placed in through interventional radiology 03/24/16 -he is not a candidate for general anesthesia and surgery -GB fluid culture pending. Leukocytosis is improving. Follow-up CBC. Tolerated diet.  2. Acute respiratory distress with hypoxia: Repeat chest x-ray showed no evidence of pneumonia or CHF. Oxygen was weaned off.  3. CAD status post CABG: EKG was negative for acute change. Patient will continue aspirin, Coreg and Lipitor. torsemide is discontinued due to acute kidney injury.  Patient will follow-up with cardiology with planned cardiac catheterization in early May.  4. Chronic diastolic heart failure: - Torsemide has been discontinued due to acute kidney injury. -Continue Coreg.  5. Acute on chronic kidney disease stage II: - Creatinine initially improved and now has worsened due to ATN from problem #1. Improving with IV fluids. Follow up BMP. Hold nephrotoxic agents.  6. Diabetes: Patient will continue outpatient regimen and ADA diet. Appreciate diabetes coordinator consultation  Her PT evaluation, the patient need home health and PT.  Case discussed with Care Management/Social Worker. Management plans discussed with the patient, his wife and  daughter and they are in agreement.  CODE STATUS: Full code  DVT Prophylaxis: Lovenox TOTAL TIME TAKING CARE OF THIS PATIENT 36 minutes.  >50% time spent on counselling and coordination of care patient and family  POSSIBLE D/C IN 2 DAYS, DEPENDING ON CLINICAL CONDITION.  Note: This dictation was prepared with Dragon dictation along with smaller phrase technology. Any transcriptional errors that result from this process are unintentional.  Demetrios Loll M.D on 03/26/2016 at 2:08 PM  Between 7am to 6pm - Pager - [phone]  After 6pm go to [email or web address] - password [email or web address] [facility]  Office  [phone]  CC: Primary care physician; Wilhemena Durie, MD

## 2016-03-27 LAB — BASIC METABOLIC PANEL
Anion gap: 9 (ref 5–15)
BUN: 42 mg/dL — AB (ref 6–20)
CALCIUM: 8.2 mg/dL — AB (ref 8.9–10.3)
CHLORIDE: 102 mmol/L (ref 101–111)
CO2: 27 mmol/L (ref 22–32)
Creatinine, Ser: 1.3 mg/dL — ABNORMAL HIGH (ref 0.61–1.24)
GFR calc Af Amer: 55 mL/min — ABNORMAL LOW (ref >60)
GFR calc non Af Amer: 47 mL/min — ABNORMAL LOW (ref >60)
Glucose, Bld: 80 mg/dL (ref 65–99)
POTASSIUM: 3.8 mmol/L (ref 3.5–5.1)
SODIUM: 138 mmol/L (ref 135–145)

## 2016-03-27 LAB — CBC
HEMATOCRIT: 32 % — AB (ref 40.0–52.0)
HEMOGLOBIN: 10.9 g/dL — AB (ref 13.0–18.0)
MCH: 30.6 pg (ref 26.0–34.0)
MCHC: 34.2 g/dL (ref 32.0–36.0)
MCV: 89.3 fL (ref 80.0–100.0)
Platelets: 202 10*3/uL (ref 150–440)
RBC: 3.58 MIL/uL — ABNORMAL LOW (ref 4.40–5.90)
RDW: 14.1 % (ref 11.5–14.5)
WBC: 11 10*3/uL — ABNORMAL HIGH (ref 3.8–10.6)

## 2016-03-27 LAB — GLUCOSE, CAPILLARY
GLUCOSE-CAPILLARY: 72 mg/dL (ref 65–99)
GLUCOSE-CAPILLARY: 188 mg/dL — AB (ref 65–99)
GLUCOSE-CAPILLARY: 203 mg/dL — AB (ref 65–99)
Glucose-Capillary: 269 mg/dL — ABNORMAL HIGH (ref 65–99)
Glucose-Capillary: 273 mg/dL — ABNORMAL HIGH (ref 65–99)

## 2016-03-27 NOTE — Progress Notes (Signed)
CC: Cholecystitis Subjective: Cholecystostomy drain in place and functioning well. Patient tolerating a diet and only complains of soreness. No complaints of abdominal pain. He is having bowel function.  Objective: Vital signs in last 24 hours: Temp:  [97.4 F (36.3 C)-98.3 F (36.8 C)] 97.4 F (36.3 C) (05/07 1110) Pulse Rate:  [66-85] 81 (05/07 1110) Resp:  [18-20] 18 (05/07 1110) BP: (139-158)/(52-81) 142/52 mmHg (05/07 1110) SpO2:  [92 %-95 %] 93 % (05/07 1110) Weight:  [103.619 kg (228 lb 7 oz)] 103.619 kg (228 lb 7 oz) (05/07 0439) Last BM Date: 03/27/16  Intake/Output from previous day: 05/06 0701 - 05/07 0700 In: 1010 [P.O.:960; IV Piggyback:50] Out: 91 [Urine:1; Drains:90] Intake/Output this shift: Total I/O In: 360 [P.O.:360] Out: -   Physical exam:  Gen.: No acute distress Chest: Clear to auscultation Heart: Regular rate and rhythm Abdomen: Soft, nontender, nondistended. Cholecystostomy drain in place in the right upper quadrant draining bilious fluid.  Lab Results: CBC   Recent Labs  03/26/16 0453 03/27/16 0454  WBC 12.1* 11.0*  HGB 10.6* 10.9*  HCT 31.8* 32.0*  PLT 181 202   BMET  Recent Labs  03/26/16 0453 03/27/16 0454  NA 138 138  K 4.1 3.8  CL 103 102  CO2 25 27  GLUCOSE 143* 80  BUN 57* 42*  CREATININE 1.56* 1.30*  CALCIUM 8.3* 8.2*   PT/INR No results for input(s): LABPROT, INR in the last 72 hours. ABG No results for input(s): PHART, HCO3 in the last 72 hours.  Invalid input(s): PCO2, PO2  Studies/Results: No results found.  Anti-infectives: Anti-infectives    Start     Dose/Rate Route Frequency Ordered Stop   03/24/16 2200  piperacillin-tazobactam (ZOSYN) IVPB 3.375 g     3.375 g 12.5 mL/hr over 240 Minutes Intravenous Every 8 hours 03/24/16 1305     03/24/16 1545  Levofloxacin (LEVAQUIN) IVPB 250 mg     250 mg 50 mL/hr over 60 Minutes Intravenous  Once 03/24/16 1543 03/24/16 1659   03/23/16 1730   piperacillin-tazobactam (ZOSYN) IVPB 3.375 g  Status:  Discontinued     3.375 g 12.5 mL/hr over 240 Minutes Intravenous Every 12 hours 03/23/16 1725 03/23/16 1727   03/23/16 1730  piperacillin-tazobactam (ZOSYN) IVPB 3.375 g  Status:  Discontinued     3.375 g 12.5 mL/hr over 240 Minutes Intravenous Every 12 hours 03/23/16 1727 03/24/16 1305   03/23/16 1421  metroNIDAZOLE (FLAGYL) IVPB 500 mg  Status:  Discontinued     500 mg 100 mL/hr over 60 Minutes Intravenous Every 8 hours 03/23/16 0951 03/23/16 1723   03/22/16 0000  ciprofloxacin (CIPRO) 500 MG tablet     500 mg Oral 2 times daily 03/22/16 1056     03/22/16 0000  metroNIDAZOLE (FLAGYL) 500 MG tablet     500 mg Oral 3 times daily 03/22/16 1056     03/21/16 2000  ciprofloxacin (CIPRO) IVPB 400 mg  Status:  Discontinued     400 mg 200 mL/hr over 60 Minutes Intravenous Every 12 hours 03/21/16 1745 03/23/16 1723   03/21/16 1830  metroNIDAZOLE (FLAGYL) IVPB 500 mg  Status:  Discontinued     500 mg 100 mL/hr over 60 Minutes Intravenous Every 12 hours 03/21/16 1747 03/23/16 0951   03/21/16 1600  ciprofloxacin (CIPRO) IVPB 400 mg  Status:  Discontinued     400 mg 200 mL/hr over 60 Minutes Intravenous Every 12 hours 03/21/16 1547 03/21/16 1745   03/21/16 1600  metroNIDAZOLE (FLAGYL) IVPB 500  mg  Status:  Discontinued     500 mg 100 mL/hr over 60 Minutes Intravenous Every 12 hours 03/21/16 1547 03/21/16 1746   03/21/16 1315  piperacillin-tazobactam (ZOSYN) IVPB 3.375 g     3.375 g 100 mL/hr over 30 Minutes Intravenous  Once 03/21/16 1303 03/21/16 1435      Assessment/Plan:  82 year old male status post percutaneous cholecystostomy drain placed for acute cholecystitis. He will need to continue with the straight for multiple weeks. No plans for surgical intervention at this time. Patient may be discharged with the drain in place per his primary team at their discretion.  Ogle Hoeffner T. Adonis Huguenin, MD, FACS  03/27/2016

## 2016-03-27 NOTE — Progress Notes (Signed)
Pt. Walked to elevator and back with front wheeled walker, gait belt and stand by assist. Pt. Tolerated well, denied pain when asked. No SOB or acute distress noted. Will continue to monitor pt.

## 2016-03-27 NOTE — Progress Notes (Signed)
Patient ID: Joseph [name], male   DOB: [birth year], 82 y.o.   MRN: [id number] [place] [facility] [place] NAME: Joseph [name]    MR#:  [id number]  DATE OF BIRTH:  1925/04/08  SUBJECTIVE:  S/p cholecystostomy drain placement POD#3 He tolerated diet. No complaint except weakness and body sore.  REVIEW OF SYSTEMS:   Review of Systems  Constitutional: Negative for fever, chills and weight loss.  HENT: Negative for ear discharge, ear pain and nosebleeds.   Eyes: Negative for blurred vision, pain and discharge.  Respiratory: Negative for sputum production, shortness of breath, wheezing and stridor.   Cardiovascular: Negative for chest pain, palpitations, orthopnea and PND.  Gastrointestinal: No abdominal pain. Negative for nausea, vomiting and diarrhea.  Genitourinary: Negative for urgency and frequency.  Musculoskeletal: Negative for back pain and joint pain.  Neurological: Negative for sensory change, speech change, focal weakness and weakness.  Psychiatric/Behavioral: Negative for depression and hallucinations. The patient is not nervous/anxious.   All other systems reviewed and are negative.  Tolerating Diet:soft diet Tolerating PT: Pending  DRUG ALLERGIES:  No Known Allergies  VITALS:  Blood pressure 142/52, pulse 81, temperature 97.4 F (36.3 C), temperature source Oral, resp. rate 18, height 5\' 7"  (1.702 m), weight 103.619 kg (228 lb 7 oz), SpO2 93 %.  PHYSICAL EXAMINATION:   Physical Exam  GENERAL:  82 y.o.-year-old patient lying in the bed with no acute distress.  EYES: Pupils equal, round, reactive to light and accommodation. No scleral icterus. Extraocular muscles intact.  HEENT: Head atraumatic, normocephalic. Oropharynx and nasopharynx clear.  NECK:  Supple, no jugular venous distention. No thyroid enlargement, no tenderness.  LUNGS: Normal breath sounds bilaterally, no wheezing, rales, rhonchi. No use of accessory  muscles of respiration.  CARDIOVASCULAR: S1, S2 normal. No murmurs, rubs, or gallops.  ABDOMEN: Soft, no tenderness, nondistended. Bowel sounds present. No organomegaly or mass. Drain + EXTREMITIES: No cyanosis, clubbing or edema b/l.    NEUROLOGIC: Cranial nerves II through XII are intact. No focal Motor or sensory deficits b/l.   PSYCHIATRIC:  patient is alert and oriented x 3.  SKIN: No obvious rash, lesion, or ulcer.   LABORATORY PANEL:  CBC  Recent Labs Lab 03/27/16 0454  WBC 11.0*  HGB 10.9*  HCT 32.0*  PLT 202    Chemistries   Recent Labs Lab 03/24/16 0424 03/26/16 0453 03/27/16 0454  NA 134* 138 138  K 4.9 4.1 3.8  CL 101 103 102  CO2 26 25 27   GLUCOSE 136* 143* 80  BUN 46* 57* 42*  CREATININE 1.96* 1.56* 1.30*  CALCIUM 8.3* 8.3* 8.2*  MG  --  2.5*  --   AST 24  --   --   ALT 20  --   --   ALKPHOS 56  --   --   BILITOT 1.0  --   --    Cardiac Enzymes  Recent Labs Lab 03/21/16 0730  TROPONINI <0.03   RADIOLOGY:  No results found. ASSESSMENT AND PLAN:  82 year old male with a history of CAD/4 vessel CABG, diastolic heart failure and hyperlipidemia who presented with chest pain and abdominal pain and found to have diverticulitis. For further details please refer the H&P.  1. Acute cholecystitis Initial CT shows mild acute sigmoid diverticulitis with right upper chronic ultrasound showing gallstones but no acute cholecystitis. Continue IV Zosyn  HIDA scan positive for acute cholecystitis. s/p cholecystostomy tube placed in through interventional radiology 03/24/16 -he  is not a candidate for general anesthesia and surgery -GB fluid culture pending. Leukocytosis is improved. Tolerated diet.  2. Acute respiratory distress with hypoxia: Repeat chest x-ray showed no evidence of pneumonia or CHF. Oxygen was weaned off.  3. CAD status post CABG: EKG was negative for acute change. Patient will continue aspirin, Coreg and Lipitor. torsemide is discontinued  due to acute kidney injury.  Patient will follow-up with cardiology with planned cardiac catheterization in early May.  4. Chronic diastolic heart failure: - Torsemide has been discontinued due to acute kidney injury. -Continue Coreg.  5. Acute on chronic kidney disease stage II: - Creatinine initially improved and now has worsened due to ATN from problem #1. Improved with IV fluids. Discontinue IVF. Hold nephrotoxic agents.  6. Diabetes: Patient will continue outpatient regimen and ADA diet. Waiting for diabetes coordinator education.  Her PT evaluation, the patient need home health and PT. Case discussed with Care Management/Social Worker. Management plans discussed with the patient, his wife and daughter for a long time, they have concerns about diabetes education and home health, all questions were answered, and they are in agreement.  CODE STATUS: Full code  DVT Prophylaxis: Lovenox TOTAL TIME TAKING CARE OF THIS PATIENT 46 minutes.  >50% time spent on counselling and coordination of care patient and family  POSSIBLE D/C IN 1 DAYS, DEPENDING ON CLINICAL CONDITION.  Note: This dictation was prepared with Dragon dictation along with smaller phrase technology. Any transcriptional errors that result from this process are unintentional.  Demetrios Loll M.D on 03/27/2016 at 12:12 PM  Between 7am to 6pm - Pager - [phone]  After 6pm go to [email or web address] - password [email or web address] [facility]  Office  [phone]  CC: Primary care physician; Wilhemena Durie, MD

## 2016-03-27 NOTE — Progress Notes (Signed)
Patient is c/o of right sided pain with breathing. O2 sats 93% on room air. Lung sounds are diminished in bilateral bases. Notified Dr. Bridgett Larsson, no new orders at this time. Vicodin 1 tab given. Will continue to monitor Horton Finer

## 2016-03-27 NOTE — Discharge Instructions (Signed)
Heart healthy and ADA diet. °Activity as tolerated. °Fall precaution. °HHPT. °

## 2016-03-28 ENCOUNTER — Other Ambulatory Visit: Payer: PPO

## 2016-03-28 LAB — GLUCOSE, CAPILLARY
GLUCOSE-CAPILLARY: 148 mg/dL — AB (ref 65–99)
Glucose-Capillary: 147 mg/dL — ABNORMAL HIGH (ref 65–99)
Glucose-Capillary: 64 mg/dL — ABNORMAL LOW (ref 65–99)
Glucose-Capillary: 110 mg/dL — ABNORMAL HIGH (ref 65–99)

## 2016-03-28 LAB — CULTURE, BODY FLUID-BOTTLE: GRAM STAIN: NONE SEEN

## 2016-03-28 LAB — CULTURE, BODY FLUID W GRAM STAIN -BOTTLE: Culture: NO GROWTH

## 2016-03-28 MED ORDER — BLOOD GLUCOSE MONITOR KIT: PACK | Status: DC

## 2016-03-28 MED ORDER — CEPHALEXIN 500 MG PO CAPS: 500.0000 mg | ORAL_CAPSULE | Freq: Two times a day (BID) | ORAL | Status: DC

## 2016-03-28 MED ORDER — INSULIN ASPART 100 UNIT/ML ~~LOC~~ SOLN
0.0000 [IU] | Freq: Three times a day (TID) | SUBCUTANEOUS | Status: DC
Start: 2016-03-28 — End: 2020-06-08

## 2016-03-28 MED ORDER — INSULIN GLARGINE 100 UNIT/ML ~~LOC~~ SOLN: 68.0000 [IU] | Freq: Every day | SUBCUTANEOUS | Status: DC

## 2016-03-28 MED ORDER — HYDROCODONE-ACETAMINOPHEN 5-325 MG PO TABS: 1.0000 | ORAL_TABLET | Freq: Four times a day (QID) | ORAL | Status: DC | PRN

## 2016-03-28 MED ORDER — INSULIN ASPART 100 UNIT/ML ~~LOC~~ SOLN: 4.0000 [IU] | Freq: Three times a day (TID) | SUBCUTANEOUS | Status: DC

## 2016-03-28 NOTE — Care Management Note (Signed)
Case Management Note  Patient Details  Name: Joseph [name] MRN: [id number] Date of Birth: [birth year]  Subjective/Objective:        [facility]-PT re-evaluated [name] this afternoon per family insistence. PT is now recommending SNF. CSW was updated per PT recommendation.             Action/Plan:   Expected Discharge Date:                  Expected Discharge Plan:  [place]  In-House Referral:     Discharge planning Services  CM Consult  Post Acute Care Choice:  Home Health Choice offered to:  Patient, Spouse  DME Arranged:  Walker rolling with seat DME Agency:  [place] Arranged:  RN, PT [facility] Agency:  [place]  Status of Service:  Completed, signed off  Medicare Important Message Given:  Yes Date Medicare IM Given:    Medicare IM give by:    Date Additional Medicare IM Given:    Additional Medicare Important Message give by:     If discussed at [place] of Stay Meetings, dates discussed:    Additional Comments:  Betsie Peckman A, RN 03/28/2016, 3:27 PM

## 2016-03-28 NOTE — Progress Notes (Signed)
Physical Therapy Treatment Patient Details Name: Joseph [name] MRN: [id number] DOB: 29-Apr-1925 Today's Date: 03/28/2016    History of Present Illness Pt admitted for acute respiratory failure with hypoxia. While in hospital, pt received a cholecystotomy drain placement on 5/4. Pt with history of DM, HTN, CAD and has cardiac cath schedule for 03/31/16.    PT Comments    Pt demonstrated great difficulty with mobility and dyspnea with exertion. He required mod A for supine to sit and scooting to EOB. Pt needs mod A to perform STS from an elevated surface with multiple attempts required. He ambulated 125 ft with FWW and min A and frequent cues for staying close to [facility] and postural correction for safety. After ambulation SpO2 93% on RA. Stairs no attempted for safety reasons. Due to significant difficulty with mobility, discharge recommendation has been updated to SNF to address deficits of strength, balance, endurance, transfers and gait. Pt would be unsafe to return home with elderly/frail wife at this time. Pt will benefit from continued skilled PT services to increase functional I and mobility for safe discharge.   Follow Up Recommendations  SNF     Equipment Recommendations  Rolling walker with 5" wheels    Recommendations for Other Services       Precautions / Restrictions Precautions Precautions: Fall Restrictions Weight Bearing Restrictions: No    Mobility  Bed Mobility Overal bed mobility: Needs Assistance Bed Mobility: Supine to Sit     Supine to sit: Mod assist     General bed mobility comments: Extreme difficulty getting legs off EOB and getting trunk upright. Uses rail. Mod A to scoot to EOB also. Fatigues quickly.  Transfers Overall transfer level: Needs assistance Equipment used: Rolling walker (2 wheeled) Transfers: Sit to/from [facility] Sit to Stand: Mod assist Stand pivot transfers: Mod assist       General transfer comment: Pt  demonstrated extreme difficulty going from sit to stand with difficulty straightening knees and back for upright position. Cues for hand placement, anterior weight shifting and postural correction.  Ambulation/Gait Ambulation/Gait assistance: Min assist Ambulation Distance (Feet): 125 Feet Assistive device: Rolling walker (2 wheeled) Gait Pattern/deviations: Decreased stride length;Drifts right/left;Trunk flexed;Narrow base of support Gait velocity: reduced Gait velocity interpretation: <1.8 ft/sec, indicative of risk for recurrent falls General Gait Details: Pt demonstrated poor posture with moderate forward flexed trunk, causing him to push the FWW away from him. Cues for upright posture and staying close to the walker. Pt fatigues quickly and becomes SOB. Cues for pursed lip breathing. Pt demonstrated unsteadiness and wouldn't be safe walking without physical assistance.   Stairs            Wheelchair Mobility    Modified Rankin (Stroke Patients Only)       Balance Overall balance assessment: Needs assistance Sitting-balance support: Bilateral upper extremity supported;Feet supported Sitting balance-Leahy Scale: Fair   Postural control: Posterior lean Standing balance support: Bilateral upper extremity supported Standing balance-Leahy Scale: Fair Standing balance comment: poor posture                    Cognition Arousal/Alertness: Awake/alert Behavior During Therapy: WFL for tasks assessed/performed Overall Cognitive Status: Within Functional Limits for tasks assessed                      Exercises Other Exercises Other Exercises: Ambulated up to 125 ft. See ambulation section for details.    General Comments General comments (skin integrity, edema,  etc.): RUQ billiary tube      Pertinent Vitals/Pain Pain Assessment: 0-10 Pain Score: 8  Pain Location: abdominal Pain Descriptors / Indicators: Aching Pain Intervention(s): Limited activity  within patient's tolerance;Monitored during session;Patient requesting pain meds-RN notified    Home Living                      Prior Function            PT Goals (current goals can now be found in the care plan section) Acute Rehab PT Goals Patient Stated Goal: to get stronger PT Goal Formulation: With patient Time For Goal Achievement: 04/05/16 Potential to Achieve Goals: Good Progress towards PT goals: Not progressing toward goals - comment    Frequency  Min 2X/week    PT Plan Discharge plan needs to be updated    Co-evaluation             End of Session Equipment Utilized During Treatment: Gait belt Activity Tolerance: Patient limited by fatigue Patient left: in chair;with call bell/phone within reach;with chair alarm set;with family/visitor present     Time: 1345-1413 PT Time Calculation (min) (ACUTE ONLY): 28 min  Charges:  $Gait Training: 8-22 mins $Therapeutic Activity: 8-22 mins                    G Codes:      Neoma Laming, PT, DPT  03/28/2016, 2:34 PM [phone]

## 2016-03-28 NOTE — Care Management Note (Signed)
Case Management Note  Patient Details  Name: Joseph [name] MRN: [id number] Date of Birth: 16-May-1925  Subjective/Objective:        Referral for home health RN, PT, Aid called to Floydene Flock at [facility].  Corene Cornea was asked to discuss other DME supplies requested by family such as Chux, shower chair, etc.with daughter. Daughter has a prescription in hand for a glucometer and supplies.  Corene Cornea delivered a rollator and a RW to Mr [place] room. Wife does not want a BSC.           Action/Plan:   Expected Discharge Date:                  Expected Discharge Plan:  [place]  In-House Referral:     Discharge planning Services  CM Consult  Post Acute Care Choice:  Home Health Choice offered to:  Patient, Spouse  DME Arranged:  Walker rolling with seat DME Agency:  [place] Arranged:  RN, PT [facility] Agency:  [place]  Status of Service:  Completed, signed off  Medicare Important Message Given:  Yes Date Medicare IM Given:    Medicare IM give by:    Date Additional Medicare IM Given:    Additional Medicare Important Message give by:     If discussed at [place] of Stay Meetings, dates discussed:    Additional Comments:  Kasper Mudrick A, RN 03/28/2016, 10:53 AM

## 2016-03-28 NOTE — Care Management Important Message (Signed)
Important Message  Patient Details  Name: Joseph [name] MRN: [id number] Date of Birth: 19-Aug-1925   Medicare Important Message Given:  Yes    Manar Smalling A, RN 03/28/2016, 7:12 AM

## 2016-03-28 NOTE — Clinical Social Work Note (Signed)
Clinical Social Work Assessment  Patient Details  Name: Joseph [name] MRN: [id number] Date of Birth: [birth year]  Date of referral:  03/28/16               Reason for consult:  Facility Placement                Permission sought to share information with:  Facility Sport and exercise psychologist, Family Supports Permission granted to share information::  Yes, Verbal Permission Granted  Name::        Agency::     Relationship::     Contact Information:     Housing/Transportation Living arrangements for the past 2 months:  Single Family Home Source of Information:  Spouse, Adult Children Patient Interpreter Needed:  None Criminal Activity/Legal Involvement Pertinent to Current Situation/Hospitalization:  No - Comment as needed Significant Relationships:  Adult Children, Spouse Lives with:  Adult Children, Spouse Do you feel safe going back to the place where you live?  No Need for family participation in patient care:  Yes (Comment)  Care giving concerns:  Patient resides with his wife alone.   Social Worker assessment / plan:  Patient's daughter and wife were insistent this morning that patient go to STR and that PT reassess patient. Patient's wife and daughter were expressing concern over patient's ability to stand from a seated position and in ambulation. PT reassessed patient this afternoon and they did change their recommendation. Patient and family chose [facility] as the facility for patient to go to. CSW contacted Ailene Ravel at [facility] and was told that because patient declined from just 3 days ago and his sats dropped, that she knew her medical director would question the discharge from the hospital and so she was going to have to send the request to her Market researcher. Ailene Ravel stated that she would not hear back from her medical director until in the morning. After much advocating, Ailene Ravel was able to get in touch with her Medical Director and a decision was made  at 4:55 to deny patient STR. CSW has relayed this to the family. The family was very appreciative for my efforts and will take patient home with home health this evening.  Employment status:  Retired Nurse, adult PT Recommendations:  [place] / Referral to community resources:  [place]  Patient/Family's Response to care:  Patient and family very appreciative for CSW assistance.  Patient/Family's Understanding of and Emotional Response to Diagnosis, Current Treatment, and Prognosis:  Patient and family discouraged at outcome but were very thankful for CSW efforts.  Emotional Assessment Appearance:  Appears stated age Attitude/Demeanor/Rapport:  Apprehensive Affect (typically observed):  Frustrated Orientation:  Oriented to Self, Oriented to Situation, Oriented to  Time Alcohol / Substance use:  Not Applicable Psych involvement (Current and /or in the community):  No (Comment)  Discharge Needs  Concerns to be addressed:  Care Coordination Readmission within the last 30 days:  No Current discharge risk:  None Barriers to Discharge:  No Barriers Identified   Shela Leff, LCSW 03/28/2016, 3:16 PM

## 2016-03-28 NOTE — Progress Notes (Signed)
CC: cholecystitis s/p per drain Subjective: Feeling weak, apparently some abdominal pain but this am feels better. + BM, taking po. WBC and creat improving. AVSS No output recorded per nurses notes  Objective: Vital signs in last 24 hours: Temp:  [97.4 F (36.3 C)-98.6 F (37 C)] 98.4 F (36.9 C) (05/08 0420) Pulse Rate:  [57-81] 64 (05/08 0725) Resp:  [18-20] 20 (05/08 0725) BP: (138-151)/(51-55) 151/55 mmHg (05/08 0725) SpO2:  [93 %-97 %] 97 % (05/08 0725) Weight:  [104.463 kg (230 lb 4.8 oz)] 104.463 kg (230 lb 4.8 oz) (05/08 0420) Last BM Date: 03/27/16  Intake/Output from previous day: 05/07 0701 - 05/08 0700 In: 940 [P.O.:840; IV Piggyback:100] Out: 300 [Urine:300] Intake/Output this shift: Total I/O In: -  Out: 100 [Drains:100]  Physical exam: NAD, Obese, awake alert Abd: soft, minimal discomofrt around drain, drain w bile. No peritonitis  Lab Results: CBC   Recent Labs  03/26/16 0453 03/27/16 0454  WBC 12.1* 11.0*  HGB 10.6* 10.9*  HCT 31.8* 32.0*  PLT 181 202   BMET  Recent Labs  03/26/16 0453 03/27/16 0454  NA 138 138  K 4.1 3.8  CL 103 102  CO2 25 27  GLUCOSE 143* 80  BUN 57* 42*  CREATININE 1.56* 1.30*  CALCIUM 8.3* 8.2*   PT/INR No results for input(s): LABPROT, INR in the last 72 hours. ABG No results for input(s): PHART, HCO3 in the last 72 hours.  Invalid input(s): PCO2, PO2  Studies/Results: No results found.  Anti-infectives: Anti-infectives    Start     Dose/Rate Route Frequency Ordered Stop   03/28/16 0000  cephALEXin (KEFLEX) 500 MG capsule     500 mg Oral 2 times daily 03/28/16 0847     03/24/16 2200  piperacillin-tazobactam (ZOSYN) IVPB 3.375 g     3.375 g 12.5 mL/hr over 240 Minutes Intravenous Every 8 hours 03/24/16 1305     03/24/16 1545  Levofloxacin (LEVAQUIN) IVPB 250 mg     250 mg 50 mL/hr over 60 Minutes Intravenous  Once 03/24/16 1543 03/24/16 1659   03/23/16 1730  piperacillin-tazobactam (ZOSYN) IVPB 3.375  g  Status:  Discontinued     3.375 g 12.5 mL/hr over 240 Minutes Intravenous Every 12 hours 03/23/16 1725 03/23/16 1727   03/23/16 1730  piperacillin-tazobactam (ZOSYN) IVPB 3.375 g  Status:  Discontinued     3.375 g 12.5 mL/hr over 240 Minutes Intravenous Every 12 hours 03/23/16 1727 03/24/16 1305   03/23/16 1421  metroNIDAZOLE (FLAGYL) IVPB 500 mg  Status:  Discontinued     500 mg 100 mL/hr over 60 Minutes Intravenous Every 8 hours 03/23/16 0951 03/23/16 1723   03/22/16 0000  ciprofloxacin (CIPRO) 500 MG tablet  Status:  Discontinued     500 mg Oral 2 times daily 03/22/16 1056 03/28/16    03/22/16 0000  metroNIDAZOLE (FLAGYL) 500 MG tablet     500 mg Oral 3 times daily 03/22/16 1056     03/21/16 2000  ciprofloxacin (CIPRO) IVPB 400 mg  Status:  Discontinued     400 mg 200 mL/hr over 60 Minutes Intravenous Every 12 hours 03/21/16 1745 03/23/16 1723   03/21/16 1830  metroNIDAZOLE (FLAGYL) IVPB 500 mg  Status:  Discontinued     500 mg 100 mL/hr over 60 Minutes Intravenous Every 12 hours 03/21/16 1747 03/23/16 0951   03/21/16 1600  ciprofloxacin (CIPRO) IVPB 400 mg  Status:  Discontinued     400 mg 200 mL/hr over 60 Minutes Intravenous Every  12 hours 03/21/16 1547 03/21/16 1745   03/21/16 1600  metroNIDAZOLE (FLAGYL) IVPB 500 mg  Status:  Discontinued     500 mg 100 mL/hr over 60 Minutes Intravenous Every 12 hours 03/21/16 1547 03/21/16 1746   03/21/16 1315  piperacillin-tazobactam (ZOSYN) IVPB 3.375 g     3.375 g 100 mL/hr over 30 Minutes Intravenous  Once 03/21/16 1303 03/21/16 1435      Assessment/Plan: 82 year old male status post percutaneous cholecystostomy drain placed for acute cholecystitis. He will need to continue with the drain for multiple weeks. No  surgical intervention at this time.  Rest of issues per hospitalist.   Caroleen Hamman, MD, [facility]  03/28/2016

## 2016-03-28 NOTE — Progress Notes (Signed)
Discharge instructions explained in detail to pt/ pts wife/ and pts daughter/ verbalized an understanding/ educated on care of drain/ pts daughter demonstrated how to drain tube / iv and tele removed/ rx given to pt/ home health set up/ will transport off unit via wheelchair.

## 2016-03-28 NOTE — NC FL2 (Signed)
[place] LEVEL OF CARE SCREENING TOOL     IDENTIFICATION  Patient Name: Joseph [name] Birthdate: [birth year] Sex: male Admission Date (Current Location): 03/21/2016  [facility] and [place] Number:  Engineering geologist and Address:  [facility], [address] [place], [place] [address]      Provider Number: [id number]  Attending Physician Name and Address:  Demetrios Loll, MD  Relative Name and Phone Number:       Current Level of Care: Hospital Recommended Level of Care: [place] Prior Approval Number:    Date Approved/Denied:   PASRR Number:    Discharge Plan: SNF    Current Diagnoses: Patient Active Problem List   Diagnosis Date Noted  . Acute cholecystitis   . Abdominal pain   . Calculus of gallbladder with acute cholecystitis without obstruction   . Diverticulitis of large intestine without perforation or abscess without bleeding   . Pain of upper abdomen   . SOB (shortness of breath)   . S/P CABG (coronary artery bypass graft)   . Coronary artery disease involving coronary bypass graft of native heart with unspecified angina pectoris   . Morbid obesity due to excess calories ([place])   . Acute respiratory failure with hypoxia ([place]) 03/21/2016  . Back pain, chronic 07/28/2015  . Benign fibroma of prostate 07/28/2015  . Acid reflux 07/28/2015  . Adult hypothyroidism 07/28/2015  . Eunuchoidism 07/28/2015  . Neuropathy ([place]) 07/28/2015  . Arthritis, degenerative 07/28/2015  . Psoriasis 07/28/2015  . Obesity 09/09/2014  . Bilateral leg weakness 09/09/2014  . Diastolic CHF, acute on chronic ([place]) 05/13/2014  . Bilateral leg edema 04/15/2014  . Bradycardia 08/13/2013  . Edema 06/21/2012  . Fatigue 09/15/2011  . Diabetes mellitus with peripheral vascular disease ([place]) 09/15/2011  . DYSPNEA 06/09/2009  . Hyperlipidemia 06/07/2009  . HYPERTENSION, BENIGN 06/07/2009  . CAD, ARTERY BYPASS GRAFT 06/07/2009     Orientation RESPIRATION BLADDER Height & Weight     Self, Situation, Place  Normal Continent Weight: 230 lb 4.8 oz (104.463 kg) Height:  5\' 7"  (170.2 cm)  BEHAVIORAL SYMPTOMS/MOOD NEUROLOGICAL BOWEL NUTRITION STATUS   (none)  (none) Continent Diet (heart healthy/carb modified)  AMBULATORY STATUS COMMUNICATION OF NEEDS Skin   Limited Assist Verbally Normal                       Personal Care Assistance Level of Assistance  Bathing, Dressing Bathing Assistance: Limited assistance   Dressing Assistance: Limited assistance     Functional Limitations Info  Hearing   Hearing Info: Impaired      SPECIAL CARE FACTORS FREQUENCY  PT (By licensed PT)                    Contractures Contractures Info: Not present    Additional Factors Info  Code Status, Allergies Code Status Info: Full Allergies Info: nka           Current Medications (03/28/2016):  This is the current hospital active medication list Current Facility-Administered Medications  Medication Dose Route Frequency Provider Last Rate Last Dose  . acetaminophen (TYLENOL) tablet 650 mg  650 mg Oral Q6H PRN Epifanio Lesches, MD   650 mg at 03/24/16 1747   Or  . acetaminophen (TYLENOL) suppository 650 mg  650 mg Rectal Q6H PRN Epifanio Lesches, MD      . aspirin EC tablet 81 mg  81 mg Oral Daily Lenis Noon, [facility]  81 mg at 03/28/16 0852  . atorvastatin (LIPITOR) tablet 40 mg  40 mg Oral Daily Lenis Noon, RPH   40 mg at 03/28/16 [id number]  . bisacodyl (DULCOLAX) EC tablet 5 mg  5 mg Oral Daily PRN Epifanio Lesches, MD      . bisacodyl (DULCOLAX) suppository 10 mg  10 mg Rectal Once Florene Glen, MD   10 mg at 03/24/16 1600  . carvedilol (COREG) tablet 3.125 mg  3.125 mg Oral BID WC Epifanio Lesches, MD   3.125 mg at 03/28/16 0852  . cholecalciferol (VITAMIN D) tablet 2,000 Units  2,000 Units Oral Daily Epifanio Lesches, MD   2,000 Units at 03/28/16 [phone]  . doxazosin (CARDURA) tablet 4 mg   4 mg Oral BID Epifanio Lesches, MD   4 mg at 03/28/16 0852  . enoxaparin (LOVENOX) injection 40 mg  40 mg Subcutaneous Q24H Crystal Jennefer Bravo, RPH   40 mg at 03/27/16 2053  . hydrALAZINE (APRESOLINE) tablet 25 mg  25 mg Oral BID Epifanio Lesches, MD   25 mg at 03/28/16 0851  . HYDROcodone-acetaminophen (NORCO/VICODIN) 5-325 MG per tablet 1-2 tablet  1-2 tablet Oral Q4H PRN [name], MD   2 tablet at 03/28/16 [id number]  . insulin aspart (novoLOG) injection 0-5 Units  0-5 Units Subcutaneous QHS Bettey Costa, MD   2 Units at 03/27/16 2055  . insulin aspart (novoLOG) injection 0-9 Units  0-9 Units Subcutaneous TID WC Bettey Costa, MD   2 Units at 03/27/16 1709  . insulin glargine (LANTUS) injection 70 Units  70 Units Subcutaneous QHS Epifanio Lesches, MD   70 Units at 03/27/16 2053  . ipratropium-albuterol (DUONEB) 0.5-2.5 (3) MG/3ML nebulizer solution 3 mL  3 mL Nebulization Q4H PRN [name], MD   3 mL at 03/24/16 1559  . levothyroxine (SYNTHROID, LEVOTHROID) tablet 50 mcg  50 mcg Oral Q0600 Epifanio Lesches, MD   50 mcg at 03/28/16 0424  . nitroGLYCERIN (NITROSTAT) SL tablet 0.4 mg  0.4 mg Sublingual Q5 min PRN Epifanio Lesches, MD      . ondansetron (ZOFRAN) tablet 4 mg  4 mg Oral Q6H PRN Epifanio Lesches, MD       Or  . ondansetron (ZOFRAN) injection 4 mg  4 mg Intravenous Q6H PRN Epifanio Lesches, MD      . pantoprazole (PROTONIX) EC tablet 40 mg  40 mg Oral Daily Lenis Noon, RPH   40 mg at 03/28/16 [id number]  . pentafluoroprop-tetrafluoroeth (GEBAUERS) aerosol   Topical PRN Bettey Costa, MD   30 application at [id number]  . piperacillin-tazobactam (ZOSYN) IVPB 3.375 g  3.375 g Intravenous Q8H Crystal Jennefer Bravo, RPH   3.375 g at 03/28/16 0424     Discharge Medications: Please see discharge summary for a list of discharge medications.  Relevant Imaging Results:  Relevant Lab Results:   Additional Information SS: [id number]  Shela Leff, LCSW

## 2016-03-28 NOTE — Progress Notes (Signed)
Dr. Bridgett Larsson paged / family and pt requesting rx for pain medications/ Dr. Bridgett Larsson states " he is driving and its too late..call the oncall MD"/ on call MD paged.

## 2016-03-28 NOTE — Care Management Note (Addendum)
Case Management Note  Patient Details  Name: Joseph [name] MRN: [id number] Date of Birth: [birth year]  Subjective/Objective:       4 family members in [name] room and they are continuing to insist that [name] be discharged to Rehab instead of to home. Discussed option of private pay nursing assistants with daughter who declined offer of a list of local providers. This Probation officer called [name], Acting PT Supervisor at 3600 to request a PT re-eval today in front of family per PT is recommending home health PT.  [name] walked 84' with assistance and a rolling walker with PT.  [name] stated that she would have a PT therapist re-evaluate [name] today sometime after lunch.            Action/Plan:   Expected Discharge Date:                  Expected Discharge Plan:  [place]  In-House Referral:     Discharge planning Services  CM Consult  Post Acute Care Choice:  Home Health Choice offered to:  Patient, Spouse  DME Arranged:  Walker rolling with seat DME Agency:  [place] Arranged:  RN, PT [facility] Agency:  [place]  Status of Service:  Completed, signed off  Medicare Important Message Given:  Yes Date Medicare IM Given:    Medicare IM give by:    Date Additional Medicare IM Given:    Additional Medicare Important Message give by:     If discussed at [place] of Stay Meetings, dates discussed:    Additional Comments:  [name] A, RN 03/28/2016, 11:51 AM

## 2016-03-28 NOTE — Progress Notes (Signed)
Pharmacy Antibiotic Note  Joseph [name] is a 82 y.o. male admitted on 03/21/2016 with cholecystitis.  Pharmacy has been consulted for Zosyn dosing.  Patient currently on Day 5 of Zosyn therapy.  Plan: Continue Zosyn 3.375 gm IV q8h per EI protocol based on est CrCl~45.3 mL/min  Height: 5\' 7"  (170.2 cm) Weight: 230 lb 4.8 oz (104.463 kg) IBW/kg (Calculated) : 66.1  Temp (24hrs), Avg:98.1 F (36.7 C), Min:97.4 F (36.3 C), Max:98.6 F (37 C)   Recent Labs Lab 03/22/16 0426 03/23/16 0824 03/24/16 0424 03/26/16 0453 03/27/16 0454  WBC 15.3* 25.5* 19.8* 12.1* 11.0*  CREATININE 1.60* 2.02* 1.96* 1.56* 1.30*    Estimated Creatinine Clearance: 45.3 mL/min (by C-G formula based on Cr of 1.3).    No Known Allergies  Antimicrobials this admission: Anti-infectives    Start     Dose/Rate Route Frequency Ordered Stop   03/28/16 0000  cephALEXin (KEFLEX) 500 MG capsule     500 mg Oral 2 times daily 03/28/16 0847     03/24/16 2200  piperacillin-tazobactam (ZOSYN) IVPB 3.375 g     3.375 g 12.5 mL/hr over 240 Minutes Intravenous Every 8 hours 03/24/16 1305     03/24/16 1545  Levofloxacin (LEVAQUIN) IVPB 250 mg     250 mg 50 mL/hr over 60 Minutes Intravenous  Once 03/24/16 1543 03/24/16 1659   03/23/16 1730  piperacillin-tazobactam (ZOSYN) IVPB 3.375 g  Status:  Discontinued     3.375 g 12.5 mL/hr over 240 Minutes Intravenous Every 12 hours 03/23/16 1725 03/23/16 1727   03/23/16 1730  piperacillin-tazobactam (ZOSYN) IVPB 3.375 g  Status:  Discontinued     3.375 g 12.5 mL/hr over 240 Minutes Intravenous Every 12 hours 03/23/16 1727 03/24/16 1305   03/23/16 1421  metroNIDAZOLE (FLAGYL) IVPB 500 mg  Status:  Discontinued     500 mg 100 mL/hr over 60 Minutes Intravenous Every 8 hours 03/23/16 0951 03/23/16 1723   03/22/16 0000  ciprofloxacin (CIPRO) 500 MG tablet  Status:  Discontinued     500 mg Oral 2 times daily 03/22/16 1056 03/28/16    03/22/16 0000  metroNIDAZOLE (FLAGYL) 500 MG  tablet     500 mg Oral 3 times daily 03/22/16 1056     03/21/16 2000  ciprofloxacin (CIPRO) IVPB 400 mg  Status:  Discontinued     400 mg 200 mL/hr over 60 Minutes Intravenous Every 12 hours 03/21/16 1745 03/23/16 1723   03/21/16 1830  metroNIDAZOLE (FLAGYL) IVPB 500 mg  Status:  Discontinued     500 mg 100 mL/hr over 60 Minutes Intravenous Every 12 hours 03/21/16 1747 03/23/16 0951   03/21/16 1600  ciprofloxacin (CIPRO) IVPB 400 mg  Status:  Discontinued     400 mg 200 mL/hr over 60 Minutes Intravenous Every 12 hours 03/21/16 1547 03/21/16 1745   03/21/16 1600  metroNIDAZOLE (FLAGYL) IVPB 500 mg  Status:  Discontinued     500 mg 100 mL/hr over 60 Minutes Intravenous Every 12 hours 03/21/16 1547 03/21/16 1746   03/21/16 1315  piperacillin-tazobactam (ZOSYN) IVPB 3.375 g     3.375 g 100 mL/hr over 30 Minutes Intravenous  Once 03/21/16 1303 03/21/16 1435       Microbiology results: Results for orders placed or performed during the hospital encounter of 03/21/16  Culture, body fluid-bottle     Status: None (Preliminary result)   Collection Time: 03/24/16  3:12 PM  Result Value Ref Range Status   Specimen Description DRAINAGE CHOLE  Final  Special Requests NONE  Final   Gram Stain NO ORGANISMS SEEN NO WBC SEEN   Final   Culture NO GROWTH 3 DAYS  Final   Report Status PENDING  Incomplete    Thank you for allowing pharmacy to be a part of this patient's care.  Ved Martos G 03/28/2016 10:01 AM

## 2016-03-28 NOTE — Progress Notes (Signed)
Note Consult for Diabetes Coordinator regarding DM education. Patient was seen by Adah Perl, RN, BC-ADM on 03/23/16 (please see note for details). Called unit and spoke with Janett Billow, RN and she reports that patient's wife and daughter have questions and request to speak with Diabetes Coordinator. Spoke with patient, his wife, and his daughter about diabetes and home regimen for diabetes control. Patient reports that he is followed by [facility] for diabetes management and currently he takes Lantus 15 units QAM, Lantus 70 units QHS, Novolog 26 units with breakfast, Novolog 16 units with lunch, and Novolog 28 units with supper as an outpatient for diabetes control. Patient reports that he is taking insulin as prescribed. Patient states that the last visit he had at the [place] he was instructed to start taking Lantus 15 units in the morning with breakfast. Patient states that he is not checking his glucose at all at home and that he having frequent symptoms of hypoglycemia. In looking at glucose trends over the past 2 days and noted fasting glucose 72 mg/dl on 03/27/16 and 64 mg/dl this morning. Asked patient if he felt his glucose was low either time and patient states that he did not feel any symptoms of hypoglycemia in the past 2 mornings.  Inquired about why his doctor at the [place] added the morning dose of Lantus and he states that the [place] doctor said his Lantus may not be lasting long enough. However, patient did not provide any data on glucose trends since he is not checking his glucose at home.  Inquired about prior A1C and patient reports that he does not recall his last A1C value. Patient's family reports that patient has frequent episodes of hypoglycemia (by the way he feels indicating hypoglycemia).  Discussed glucose and A1C goals. Discussed importance of checking CBGs and maintaining good CBG control to prevent long-term and short-term complications. Explained how hyperglycemia leads to damage within blood vessels which  lead to the common complications seen with uncontrolled diabetes. Also discussed acute complications of hypoglycemia and stressed to the patient the importance of improving glycemic control to prevent further complications from uncontrolled diabetes. Discussed impact of nutrition, exercise, stress, sickness, and medications on diabetes control.Discussed proper hypoglycemia treatment with 15 grams of carbohydrates. Patient's family has already been provided with a prescription for a glucometer and testing supplies. Encouraged patient to check his glucose 4 times per day (before meals and at bedtime) and any other time he feels like his blood glucose may be low or high. Asked that he keep a log book of glucose readings and insulin taken which he will need to take to doctor appointments. Explained how the doctor he follows up with can use the log book to continue to make insulin adjustments if needed.Talked with patient and his family about current insulin regimen being used as an inpatient and patient is willing to use basal, meal coverage, and correction insulin as an outpatient.  Encouraged patient to talk with his PCP about being referred to a local Endocrinologist in order to see a specialist to assist him with improving glycemic control. Patient verbalized understanding of information discussed and he states that he has no further questions at this time related to diabetes.  Anticipate patient is taking too much insulin as an outpatient especially in reviewing glucose trends as an inpatient and also since patient is reporting frequent hypoglycemia episodes as an outpatient. Recommend patient being discharged on Lantus 68 units QHS, Novolog 4 units TID with meals for meal coverage, and  Novolog 0-9 units TID with meals (correction scale).   Thanks, Barnie Alderman, RN, MSN, CDE Diabetes Coordinator Inpatient Diabetes Program [phone] (Team Pager) [phone] ([place] office) [phone] [facility]  office) [phone] [facility] office)

## 2016-03-28 NOTE — Discharge Summary (Signed)
[facility] [place] NAME: Joseph [name]    MR#:  [id number]  DATE OF BIRTH:  [birth year]  DATE OF ADMISSION:  03/21/2016 ADMITTING PHYSICIAN: Epifanio Lesches, MD  DATE OF DISCHARGE: 03/28/2016  PRIMARY CARE PHYSICIAN: Wilhemena Durie, MD    ADMISSION DIAGNOSIS:  Acute respiratory failure with hypoxia ([place]) [J96.01] Diverticulitis of large intestine without perforation or abscess without bleeding [K57.32] Pain of upper abdomen [R10.10]   DISCHARGE DIAGNOSIS:  Acute cholecystitis Acute respiratory distress with hypoxia Acute on chronic kidney disease stage II:  SECONDARY DIAGNOSIS:   Past Medical History  Diagnosis Date  . CAD (coronary artery disease)     a. s/p CABG;  b. 07/2012 Cath: 3VD including 80 dLCX (med Rx), 4/4 patent grafts.  . Diabetes mellitus, type 2 ([place])   . Hyperlipidemia   . HTN (hypertension)   . Hypothyroidism   . Chronic diastolic CHF (congestive heart failure) ([place])     a. 07/2012 Echo: EF 55-60%, no rwma, Gr 1 DD, mild MR;  04/2014 Echo: EF 55-60%, no rwma, mild MR, mildly dil LA.  Marland Kitchen Lower extremity edema     a. 03/2014 amlodipine d/c'd.    HOSPITAL COURSE:  82 year old male with a history of CAD/4 vessel CABG, diastolic heart failure and hyperlipidemia who presented with chest pain and abdominal pain and found to have diverticulitis. For further details please refer the H&P.  1. Acute cholecystitis Initial CT shows mild acute sigmoid diverticulitis with right upper chronic ultrasound showing gallstones but no acute cholecystitis. He has been treated with IV Zosyn, changed to by mouth Keflex. HIDA scan positive for acute cholecystitis. s/p cholecystostomy tube placed in through interventional radiology 03/24/16 -he is not a candidate for general anesthesia and surgery -GB fluid culture: No growth. Leukocytosis is improved. Tolerated diet.   2. Acute respiratory distress with hypoxia: Repeat  chest x-ray showed no evidence of pneumonia or CHF. Oxygen was weaned off.  3. CAD status post CABG: EKG was negative for acute change. Patient will continue aspirin, Coreg and Lipitor. torsemide is discontinued due to acute kidney injury.  Patient will follow-up with cardiology with planned cardiac catheterization in early May.  4. Chronic diastolic heart failure: - Torsemide was discontinued due to acute kidney injury. -Continue Coreg.  5. Acute on chronic kidney disease stage II: - Creatinine initially improved and now has worsened due to ATN from problem #1. Improved with IV fluids. Discontinued IVF. Hold nephrotoxic agents.  6. Diabetes:  lantus 68 units HS and novolog 4 unit AC tid with [facility] perdiabetes coordinator.  Per PT reevaluation, the patient needs SNF.   DISCHARGE CONDITIONS:   Stable, discharge to skilled nursing facility today.  CONSULTS OBTAINED:  Treatment Team:  Maryruth Hancock, MD Florene Glen, MD  DRUG ALLERGIES:  No Known Allergies  DISCHARGE MEDICATIONS:   Current Discharge Medication List    START taking these medications   Details  carvedilol (COREG) 3.125 MG tablet Take 1 tablet (3.125 mg total) by mouth 2 (two) times daily with a meal. Qty: 60 tablet, Refills: 0    cephALEXin (KEFLEX) 500 MG capsule Take 1 capsule (500 mg total) by mouth 2 (two) times daily. Qty: 14 capsule, Refills: 0      CONTINUE these medications which have CHANGED   Details  !! insulin aspart (NOVOLOG) 100 UNIT/ML injection Inject 4 Units into the skin 3 (three) times daily before meals. Qty: 10 mL    !! insulin  aspart (NOVOLOG) 100 UNIT/ML injection Inject 0-9 Units into the skin 3 (three) times daily with meals. Qty: 10 mL, Refills: 0    insulin glargine (LANTUS) 100 UNIT/ML injection Inject 0.68 mLs (68 Units total) into the skin at bedtime. Qty: 10 mL, Refills: 0     !! - Potential duplicate medications found. Please discuss with provider.    CONTINUE  these medications which have NOT CHANGED   Details  Ascorbic Acid (VITAMIN C) 100 MG tablet Take by mouth.    aspirin EC 81 MG tablet Take 1 tablet (81 mg total) by mouth daily. Qty: 90 tablet, Refills: 3    atorvastatin (LIPITOR) 40 MG tablet Take 1 tablet (40 mg total) by mouth daily. Qty: 90 tablet, Refills: 3    cholecalciferol (VITAMIN D) 1000 UNITS tablet Take 2,000 Units by mouth daily.    ciclopirox (PENLAC) 8 % solution     doxazosin (CARDURA) 4 MG tablet Take 4 mg by mouth 2 (two) times daily.    hydrALAZINE (APRESOLINE) 25 MG tablet Take 1 tablet (25 mg total) by mouth 2 (two) times daily. Qty: 60 tablet, Refills: 12   Associated Diagnoses: Essential hypertension, benign    levothyroxine (SYNTHROID, LEVOTHROID) 50 MCG tablet Take 50 mcg by mouth daily before breakfast.    Magnesium 250 MG TABS Take by mouth.    nitroGLYCERIN (NITROSTAT) 0.4 MG SL tablet Place 1 tablet (0.4 mg total) under the tongue every 5 (five) minutes as needed for chest pain. Qty: 25 tablet, Refills: 3    omeprazole (PRILOSEC) 20 MG capsule Take 20 mg by mouth daily.      STOP taking these medications     lisinopril (PRINIVIL,ZESTRIL) 40 MG tablet      potassium chloride (K-DUR) 10 MEQ tablet      torsemide (DEMADEX) 20 MG tablet          DISCHARGE INSTRUCTIONS:   If you experience worsening of your admission symptoms, develop shortness of breath, life threatening emergency, suicidal or homicidal thoughts you must seek medical attention immediately by calling 911 or calling your MD immediately  if symptoms less severe.  You Must read complete instructions/literature along with all the possible adverse reactions/side effects for all the Medicines you take and that have been prescribed to you. Take any new Medicines after you have completely understood and accept all the possible adverse reactions/side effects.   Please note  You were cared for by a hospitalist during your hospital  stay. If you have any questions about your discharge medications or the care you received while you were in the hospital after you are discharged, you can call the unit and asked to speak with the hospitalist on call if the hospitalist that took care of you is not available. Once you are discharged, your primary care physician will handle any further medical issues. Please note that NO REFILLS for any discharge medications will be authorized once you are discharged, as it is imperative that you return to your primary care physician (or establish a relationship with a primary care physician if you do not have one) for your aftercare needs so that they can reassess your need for medications and monitor your lab values.    Today   SUBJECTIVE   No complaint.   VITAL SIGNS:  Blood pressure 135/52, pulse 59, temperature 98.4 F (36.9 C), temperature source Oral, resp. rate 16, height 5\' 7"  (1.702 m), weight 104.463 kg (230 lb 4.8 oz), SpO2 95 %.  I/O:  Intake/Output Summary (Last 24 hours) at 03/28/16 1415 Last data filed at 03/28/16 1355  Gross per 24 hour  Intake    340 ml  Output    400 ml  Net    -60 ml    PHYSICAL EXAMINATION:  GENERAL:  82 y.o.-year-old patient lying in the bed with no acute distress. Morbidly obese. EYES: Pupils equal, round, reactive to light and accommodation. No scleral icterus. Extraocular muscles intact.  HEENT: Head atraumatic, normocephalic. Oropharynx and nasopharynx clear.  NECK:  Supple, no jugular venous distention. No thyroid enlargement, no tenderness.  LUNGS: Normal breath sounds bilaterally, no wheezing, rales,rhonchi or crepitation. No use of accessory muscles of respiration.  CARDIOVASCULAR: S1, S2 normal. No murmurs, rubs, or gallops.  ABDOMEN: Soft, non-tender, non-distended. Bowel sounds present. No organomegaly or mass. Cholecystectomy drainage to in situ. EXTREMITIES: No pedal edema, cyanosis, or clubbing.  NEUROLOGIC: Cranial nerves II  through XII are intact. Muscle strength 5/5 in all extremities. Sensation intact. Gait not checked.  PSYCHIATRIC: The patient is alert and oriented x 3.  SKIN: No obvious rash, lesion, or ulcer.   DATA REVIEW:   CBC  Recent Labs Lab 03/27/16 0454  WBC 11.0*  HGB 10.9*  HCT 32.0*  PLT 202    Chemistries   Recent Labs Lab 03/24/16 0424 03/26/16 0453 03/27/16 0454  NA 134* 138 138  K 4.9 4.1 3.8  CL 101 103 102  CO2 26 25 27   GLUCOSE 136* 143* 80  BUN 46* 57* 42*  CREATININE 1.96* 1.56* 1.30*  CALCIUM 8.3* 8.3* 8.2*  MG  --  2.5*  --   AST 24  --   --   ALT 20  --   --   ALKPHOS 56  --   --   BILITOT 1.0  --   --     Cardiac Enzymes No results for input(s): TROPONINI in the last 168 hours.  Microbiology Results  Results for orders placed or performed during the hospital encounter of 03/21/16  Culture, body fluid-bottle     Status: None   Collection Time: 03/24/16  3:12 PM  Result Value Ref Range Status   Specimen Description DRAINAGE CHOLE  Final   Special Requests NONE  Final   Gram Stain NO ORGANISMS SEEN NO WBC SEEN   Final   Culture NO GROWTH 4 DAYS  Final   Report Status 03/28/2016 FINAL  Final    RADIOLOGY:  No results found.      Management plans discussed with the patient,His wife and daughter and they are in agreement.  CODE STATUS:     Code Status Orders        Start     Ordered   03/21/16 1542  Full code   Continuous     03/21/16 1547    Code Status History    Date Active Date Inactive Code Status Order ID Comments User Context   This patient has a current code status but no historical code status.    Advance Directive Documentation        Most Recent Value   Type of Advance Directive  Healthcare Power of Attorney   Pre-existing out of facility DNR order (yellow form or pink MOST form)     "MOST" Form in Place?        TOTAL TIME TAKING CARE OF THIS PATIENT: 46 minutes.    Demetrios Loll M.D on 03/28/2016 at 2:15  PM  Between 7am to 6pm - Pager - [phone]  After 6pm go to [email or web address] - password [email or web address] [place] [facility]  Office  [phone]  CC: Primary care physician; Wilhemena Durie, MD

## 2016-03-28 NOTE — Care Management Note (Signed)
Case Management Note  Patient Details  Name: Joseph [name] MRN: [id number] Date of Birth: October 26, 1925  Subjective/Objective:      [facility]-PT recommended SNF but [name] insurance denied. [name] and family are now being discharged home with home health. Jason at [facility] was updated. Daughter verbalized understanding of how to empty [name] biliary drain.               Action/Plan:   Expected Discharge Date:                  Expected Discharge Plan:  [place]  In-House Referral:     Discharge planning Services  CM Consult  Post Acute Care Choice:  Home Health Choice offered to:  Patient, Spouse  DME Arranged:  Walker rolling with seat DME Agency:  [place] Arranged:  RN, PT [facility] Agency:  [place]  Status of Service:  Completed, signed off  Medicare Important Message Given:  Yes Date Medicare IM Given:    Medicare IM give by:    Date Additional Medicare IM Given:    Additional Medicare Important Message give by:     If discussed at [place] of Stay Meetings, dates discussed:    Additional Comments:  Gareth Fitzner A, RN 03/28/2016, 5:03 PM

## 2016-03-29 ENCOUNTER — Telehealth: Payer: Self-pay | Admitting: Family Medicine

## 2016-03-29 DIAGNOSIS — Z951 Presence of aortocoronary bypass graft: Secondary | ICD-10-CM | POA: Diagnosis not present

## 2016-03-29 DIAGNOSIS — K810 Acute cholecystitis: Secondary | ICD-10-CM | POA: Diagnosis not present

## 2016-03-29 DIAGNOSIS — L409 Psoriasis, unspecified: Secondary | ICD-10-CM | POA: Diagnosis not present

## 2016-03-29 DIAGNOSIS — Z87891 Personal history of nicotine dependence: Secondary | ICD-10-CM | POA: Diagnosis not present

## 2016-03-29 DIAGNOSIS — Z7982 Long term (current) use of aspirin: Secondary | ICD-10-CM | POA: Diagnosis not present

## 2016-03-29 DIAGNOSIS — I110 Hypertensive heart disease with heart failure: Secondary | ICD-10-CM | POA: Diagnosis not present

## 2016-03-29 DIAGNOSIS — R21 Rash and other nonspecific skin eruption: Secondary | ICD-10-CM | POA: Diagnosis not present

## 2016-03-29 DIAGNOSIS — K819 Cholecystitis, unspecified: Secondary | ICD-10-CM | POA: Diagnosis not present

## 2016-03-29 DIAGNOSIS — J9811 Atelectasis: Secondary | ICD-10-CM | POA: Diagnosis not present

## 2016-03-29 DIAGNOSIS — E785 Hyperlipidemia, unspecified: Secondary | ICD-10-CM | POA: Diagnosis not present

## 2016-03-29 DIAGNOSIS — I2510 Atherosclerotic heart disease of native coronary artery without angina pectoris: Secondary | ICD-10-CM | POA: Diagnosis not present

## 2016-03-29 DIAGNOSIS — J90 Pleural effusion, not elsewhere classified: Secondary | ICD-10-CM | POA: Diagnosis not present

## 2016-03-29 DIAGNOSIS — I5033 Acute on chronic diastolic (congestive) heart failure: Secondary | ICD-10-CM | POA: Diagnosis not present

## 2016-03-29 DIAGNOSIS — R918 Other nonspecific abnormal finding of lung field: Secondary | ICD-10-CM | POA: Diagnosis not present

## 2016-03-29 DIAGNOSIS — Z794 Long term (current) use of insulin: Secondary | ICD-10-CM | POA: Diagnosis not present

## 2016-03-29 DIAGNOSIS — D72829 Elevated white blood cell count, unspecified: Secondary | ICD-10-CM | POA: Diagnosis not present

## 2016-03-29 DIAGNOSIS — I10 Essential (primary) hypertension: Secondary | ICD-10-CM | POA: Diagnosis not present

## 2016-03-29 DIAGNOSIS — R5383 Other fatigue: Secondary | ICD-10-CM | POA: Diagnosis not present

## 2016-03-29 DIAGNOSIS — E119 Type 2 diabetes mellitus without complications: Secondary | ICD-10-CM | POA: Diagnosis not present

## 2016-03-29 LAB — HEMOGLOBIN A1C: Hemoglobin A1C: 6.4

## 2016-03-29 MED ORDER — CARVEDILOL 3.125 MG PO TABS: 3.1250 mg | ORAL_TABLET | Freq: Two times a day (BID) | ORAL | Status: DC

## 2016-03-29 NOTE — Progress Notes (Addendum)
03/29/2016 1524  Family calling about prescription for carvedilol which they report they did not receive. When RN looked it up, found that it was sent to pharmacy and it could have been sent to [facility] or [facility]. Family member updated and reports understanding, states she will call these pharmacies to find carvedilol.

## 2016-03-29 NOTE — Telephone Encounter (Signed)
Pt has follow up appointment with [place] on 5/16 for hospital follow up. Per notes from hospital does he need to take all the medications as per their list for now?-aa

## 2016-03-29 NOTE — Telephone Encounter (Signed)
Daughter called needing to claify if needs needs to be taking two of his medications that they did not have on his list from when he was discahrged from [facility] yesterday.  They are

## 2016-03-29 NOTE — Progress Notes (Addendum)
03/29/2016 1530 Per family, neither pharmacy has carvedilol prescription. Dr Bridgett Larsson wrote d/c summary, paged and asked to re-send prescription to [facility] on [address].

## 2016-03-29 NOTE — Telephone Encounter (Signed)
The medication they are questioning is Carvedilol.  Please advise. [phone]  Daughter is at their home.  Please ask for her Olegario Shearer)  Thanks Con Memos

## 2016-03-30 ENCOUNTER — Telehealth: Payer: Self-pay

## 2016-03-30 ENCOUNTER — Other Ambulatory Visit: Payer: Self-pay | Admitting: Cardiovascular Disease

## 2016-03-30 DIAGNOSIS — I10 Essential (primary) hypertension: Secondary | ICD-10-CM | POA: Diagnosis not present

## 2016-03-30 DIAGNOSIS — I2510 Atherosclerotic heart disease of native coronary artery without angina pectoris: Secondary | ICD-10-CM | POA: Diagnosis not present

## 2016-03-30 DIAGNOSIS — D72829 Elevated white blood cell count, unspecified: Secondary | ICD-10-CM | POA: Diagnosis not present

## 2016-03-30 DIAGNOSIS — I209 Angina pectoris, unspecified: Secondary | ICD-10-CM

## 2016-03-30 DIAGNOSIS — K810 Acute cholecystitis: Secondary | ICD-10-CM | POA: Diagnosis not present

## 2016-03-30 NOTE — Telephone Encounter (Signed)
Left detailed message on pt's vm reminding him of cardiac cath tomorrow am.  Left all instructions and asked him to call back w/ any questions or concerns.

## 2016-03-30 NOTE — Telephone Encounter (Signed)
Yes to twice a day carvedilol

## 2016-03-30 NOTE — Telephone Encounter (Signed)
lmtcb for Vicky to call back-aa

## 2016-03-31 ENCOUNTER — Telehealth: Payer: Self-pay | Admitting: Cardiovascular Disease

## 2016-03-31 ENCOUNTER — Ambulatory Visit: Admission: RE | Admit: 2016-03-31 | Payer: PPO | Source: Ambulatory Visit | Admitting: Cardiovascular Disease

## 2016-03-31 ENCOUNTER — Encounter: Admission: RE | Payer: Self-pay | Source: Ambulatory Visit

## 2016-03-31 DIAGNOSIS — I509 Heart failure, unspecified: Secondary | ICD-10-CM | POA: Diagnosis not present

## 2016-03-31 DIAGNOSIS — Z9889 Other specified postprocedural states: Secondary | ICD-10-CM | POA: Diagnosis not present

## 2016-03-31 DIAGNOSIS — K810 Acute cholecystitis: Secondary | ICD-10-CM | POA: Diagnosis not present

## 2016-03-31 DIAGNOSIS — I2510 Atherosclerotic heart disease of native coronary artery without angina pectoris: Secondary | ICD-10-CM | POA: Diagnosis not present

## 2016-03-31 DIAGNOSIS — E039 Hypothyroidism, unspecified: Secondary | ICD-10-CM | POA: Diagnosis not present

## 2016-03-31 DIAGNOSIS — K819 Cholecystitis, unspecified: Secondary | ICD-10-CM | POA: Diagnosis not present

## 2016-03-31 DIAGNOSIS — R1011 Right upper quadrant pain: Secondary | ICD-10-CM | POA: Diagnosis not present

## 2016-03-31 DIAGNOSIS — E785 Hyperlipidemia, unspecified: Secondary | ICD-10-CM | POA: Diagnosis not present

## 2016-03-31 DIAGNOSIS — I110 Hypertensive heart disease with heart failure: Secondary | ICD-10-CM | POA: Diagnosis not present

## 2016-03-31 DIAGNOSIS — I10 Essential (primary) hypertension: Secondary | ICD-10-CM | POA: Diagnosis not present

## 2016-03-31 DIAGNOSIS — Z794 Long term (current) use of insulin: Secondary | ICD-10-CM | POA: Diagnosis not present

## 2016-03-31 DIAGNOSIS — Z951 Presence of aortocoronary bypass graft: Secondary | ICD-10-CM | POA: Diagnosis not present

## 2016-03-31 DIAGNOSIS — Z7982 Long term (current) use of aspirin: Secondary | ICD-10-CM | POA: Diagnosis not present

## 2016-03-31 DIAGNOSIS — Z87891 Personal history of nicotine dependence: Secondary | ICD-10-CM | POA: Diagnosis not present

## 2016-03-31 DIAGNOSIS — K219 Gastro-esophageal reflux disease without esophagitis: Secondary | ICD-10-CM | POA: Diagnosis not present

## 2016-03-31 DIAGNOSIS — E119 Type 2 diabetes mellitus without complications: Secondary | ICD-10-CM | POA: Diagnosis not present

## 2016-03-31 DIAGNOSIS — L409 Psoriasis, unspecified: Secondary | ICD-10-CM | POA: Diagnosis not present

## 2016-03-31 SURGERY — LEFT HEART CATH AND CORONARY ANGIOGRAPHY
Anesthesia: Moderate Sedation | Laterality: Left

## 2016-03-31 NOTE — Telephone Encounter (Signed)
Pt did not show for cardiac cath today. S/w pt wife, Izora Gala, (on [place]) who reports pt currently admitted to [facility] for gallbladder issues. Izora Gala states she thinks "it was his gallbladder all along and not his heart". She asks to cancel all upcoming appointments with Dr. Rockey Situ and states they will call back if pt would like to reschedule. Dr. Rockey Situ is aware.

## 2016-03-31 NOTE — Telephone Encounter (Signed)
Joseph [name] name is not on DPR to release information, patient needs to add her, if daughter calls back we can listen to what she has to say but not able to give out information without patient's consent. Thanks-aa

## 2016-03-31 NOTE — Telephone Encounter (Signed)
Noted-aa 

## 2016-03-31 NOTE — Telephone Encounter (Signed)
Daughter called back saying her dad is back in the hospital at [facility].  I told her about the DPR situation.  She said she will try to get a DPR signed and witnessed while he is [facility] and fax it to [place] so she can talk to [place].  She ask to cancel his appt for FU on 5/16 with Dr. Rosanna Randy.  I will cancel that appt.  Thanks, [employer]

## 2016-04-04 ENCOUNTER — Telehealth: Payer: Self-pay | Admitting: Family Medicine

## 2016-04-04 DIAGNOSIS — Z7982 Long term (current) use of aspirin: Secondary | ICD-10-CM | POA: Diagnosis not present

## 2016-04-04 DIAGNOSIS — Z9889 Other specified postprocedural states: Secondary | ICD-10-CM | POA: Diagnosis not present

## 2016-04-04 DIAGNOSIS — I110 Hypertensive heart disease with heart failure: Secondary | ICD-10-CM | POA: Diagnosis not present

## 2016-04-04 DIAGNOSIS — K810 Acute cholecystitis: Secondary | ICD-10-CM | POA: Diagnosis not present

## 2016-04-04 DIAGNOSIS — Z951 Presence of aortocoronary bypass graft: Secondary | ICD-10-CM | POA: Diagnosis not present

## 2016-04-04 DIAGNOSIS — Z87891 Personal history of nicotine dependence: Secondary | ICD-10-CM | POA: Diagnosis not present

## 2016-04-04 DIAGNOSIS — R1011 Right upper quadrant pain: Secondary | ICD-10-CM | POA: Diagnosis not present

## 2016-04-04 DIAGNOSIS — E785 Hyperlipidemia, unspecified: Secondary | ICD-10-CM | POA: Diagnosis not present

## 2016-04-04 DIAGNOSIS — R0603 Acute respiratory distress: Secondary | ICD-10-CM

## 2016-04-04 DIAGNOSIS — I2510 Atherosclerotic heart disease of native coronary artery without angina pectoris: Secondary | ICD-10-CM | POA: Diagnosis not present

## 2016-04-04 DIAGNOSIS — Z794 Long term (current) use of insulin: Secondary | ICD-10-CM | POA: Diagnosis not present

## 2016-04-04 DIAGNOSIS — E119 Type 2 diabetes mellitus without complications: Secondary | ICD-10-CM | POA: Diagnosis not present

## 2016-04-04 DIAGNOSIS — N189 Chronic kidney disease, unspecified: Secondary | ICD-10-CM

## 2016-04-04 DIAGNOSIS — I509 Heart failure, unspecified: Secondary | ICD-10-CM | POA: Diagnosis not present

## 2016-04-04 NOTE — Telephone Encounter (Signed)
Is it ok to get this process started for the patient? And what symptoms would be for the referral-aa

## 2016-04-04 NOTE — Telephone Encounter (Signed)
i pulled down the order for this in meds and orders but I need you fill out the red spots in order to be able to sign off on the order please-aa

## 2016-04-04 NOTE — Telephone Encounter (Signed)
Pt's daughter Loletha Carrow, stated that she brought pt to [facility] ER today b/c pt's gallbladder tube was stopped up. Vickie doesn't think that pt is getting the care he needs at [facility]. She would like to get [place] back involved to receive home health care and get him out of [facility]. Vickie stated that they were in the process of this before but pt was admitted to the hospital and then to [facility]. Vickie stated that she isn't sure if pt is going to be admitted to [facility] today but she really doesn't want pt to go back to [facility] and she needs help to care for pt at home. Please advise. Thanks TNP

## 2016-04-04 NOTE — Telephone Encounter (Signed)
I am willing to put in referral but round-the-clock care is very complicated and involved in this unlikely that this can be done at home.

## 2016-04-05 ENCOUNTER — Inpatient Hospital Stay: Payer: PPO | Admitting: Family Medicine

## 2016-04-05 ENCOUNTER — Ambulatory Visit: Payer: PPO | Admitting: Nurse Practitioner

## 2016-04-05 DIAGNOSIS — L0390 Cellulitis, unspecified: Secondary | ICD-10-CM | POA: Diagnosis not present

## 2016-04-05 DIAGNOSIS — I509 Heart failure, unspecified: Secondary | ICD-10-CM | POA: Diagnosis not present

## 2016-04-05 DIAGNOSIS — Z9049 Acquired absence of other specified parts of digestive tract: Secondary | ICD-10-CM | POA: Diagnosis not present

## 2016-04-05 DIAGNOSIS — R1084 Generalized abdominal pain: Secondary | ICD-10-CM | POA: Diagnosis not present

## 2016-04-05 DIAGNOSIS — E119 Type 2 diabetes mellitus without complications: Secondary | ICD-10-CM | POA: Diagnosis not present

## 2016-04-05 DIAGNOSIS — G4733 Obstructive sleep apnea (adult) (pediatric): Secondary | ICD-10-CM | POA: Diagnosis not present

## 2016-04-06 ENCOUNTER — Other Ambulatory Visit: Payer: Self-pay | Admitting: Family Medicine

## 2016-04-06 MED ORDER — ONETOUCH DELICA LANCETS 33G MISC: Status: DC

## 2016-04-06 MED ORDER — ONETOUCH ULTRASOFT LANCETS MISC: Status: DC

## 2016-04-06 NOTE — Telephone Encounter (Signed)
Done-aa 

## 2016-04-06 NOTE — Telephone Encounter (Signed)
Patient's daughter, Jocelyn Lamer, states that patient needs a refill on One Touch Delica lancets 33 gauge, four times a day as directed and One Touch Ultra Blue test strips, 4 times daily with meals and bedtime.  Patient uses [facility].  He was prescribed these while in the hospital and they are not sure that he has enough to last until he sees Dr. Rosanna Randy.

## 2016-04-07 ENCOUNTER — Encounter: Payer: Self-pay | Admitting: Internal Medicine

## 2016-04-07 ENCOUNTER — Telehealth: Payer: Self-pay | Admitting: Internal Medicine

## 2016-04-07 DIAGNOSIS — Z9049 Acquired absence of other specified parts of digestive tract: Secondary | ICD-10-CM | POA: Diagnosis not present

## 2016-04-07 DIAGNOSIS — Z4682 Encounter for fitting and adjustment of non-vascular catheter: Secondary | ICD-10-CM | POA: Diagnosis not present

## 2016-04-07 DIAGNOSIS — E119 Type 2 diabetes mellitus without complications: Secondary | ICD-10-CM | POA: Diagnosis not present

## 2016-04-07 DIAGNOSIS — Z7982 Long term (current) use of aspirin: Secondary | ICD-10-CM | POA: Diagnosis not present

## 2016-04-07 DIAGNOSIS — D649 Anemia, unspecified: Secondary | ICD-10-CM | POA: Diagnosis not present

## 2016-04-07 DIAGNOSIS — L308 Other specified dermatitis: Secondary | ICD-10-CM | POA: Diagnosis not present

## 2016-04-07 DIAGNOSIS — I2510 Atherosclerotic heart disease of native coronary artery without angina pectoris: Secondary | ICD-10-CM | POA: Diagnosis not present

## 2016-04-07 DIAGNOSIS — E785 Hyperlipidemia, unspecified: Secondary | ICD-10-CM | POA: Diagnosis not present

## 2016-04-07 DIAGNOSIS — Z794 Long term (current) use of insulin: Secondary | ICD-10-CM | POA: Diagnosis not present

## 2016-04-07 DIAGNOSIS — I509 Heart failure, unspecified: Secondary | ICD-10-CM | POA: Diagnosis not present

## 2016-04-07 DIAGNOSIS — L408 Other psoriasis: Secondary | ICD-10-CM | POA: Diagnosis not present

## 2016-04-07 DIAGNOSIS — Z48815 Encounter for surgical aftercare following surgery on the digestive system: Secondary | ICD-10-CM | POA: Diagnosis not present

## 2016-04-07 DIAGNOSIS — I110 Hypertensive heart disease with heart failure: Secondary | ICD-10-CM | POA: Diagnosis not present

## 2016-04-07 NOTE — Telephone Encounter (Signed)
Spoke with Wife. She is aware .

## 2016-04-07 NOTE — Telephone Encounter (Signed)
Patient having trouble breathing and wants sooner appt wants Dr. Juanell Fairly per Daughter as patient may have sleep apnea.  Is there anywhere we can add them into the schedule ?

## 2016-04-07 NOTE — Telephone Encounter (Signed)
There is nothing sooner that Tuesday. If pt gets in distress or if daughter gets concerned he can go to ER or call PCP. Sorry.

## 2016-04-07 NOTE — Telephone Encounter (Signed)
err

## 2016-04-08 ENCOUNTER — Telehealth: Payer: Self-pay | Admitting: Family Medicine

## 2016-04-08 ENCOUNTER — Inpatient Hospital Stay: Payer: Self-pay | Admitting: Surgery

## 2016-04-08 NOTE — Telephone Encounter (Signed)
lmtcb for Joseph [name] to see what we need to do to get this done for the patient-aa

## 2016-04-08 NOTE — Telephone Encounter (Signed)
Please review, ok for order?-aa

## 2016-04-08 NOTE — Telephone Encounter (Signed)
Ok to send order to restart oxygen. Thanks.

## 2016-04-08 NOTE — Telephone Encounter (Signed)
Sharee Pimple with [facility] called back regards to message below.  Please call back [phone]  Rozelle Logan

## 2016-04-08 NOTE — Telephone Encounter (Signed)
Amy from [facility] called saying she is taking care of Mr. Joseph [name] whom just left [facility].    She said he has been on oxygen at night sent home with out any.  She wants to get an order to restart oxygen.  His stats are 88 standing , sitting 94  Her number is [phone] ask for cindy, Amy is out today.  This is a Surveyor, quantity pt.  Thanks, [employer]

## 2016-04-11 ENCOUNTER — Telehealth: Payer: Self-pay | Admitting: Family Medicine

## 2016-04-11 ENCOUNTER — Ambulatory Visit: Payer: PPO | Admitting: Physician Assistant

## 2016-04-11 MED ORDER — GLUCOSE BLOOD VI STRP: ORAL_STRIP | Status: DC

## 2016-04-11 MED ORDER — ONETOUCH DELICA LANCETS 33G MISC: Status: DC

## 2016-04-11 NOTE — Telephone Encounter (Signed)
Pt's daughter Jocelyn Lamer stated [facility] advised her that we needed to send in RX for both [facility] DELICA LANCETS [id number] MISC & One Touch Ultra Strips. She stated she was told they got the RX for lancets but it didn't have One Touch Delica on the RX so they couldn't fill it and they didn't receive an RX for testing strips. Vickie would like [place] to contact [facility] on [address] to see about get both items. RX for both was sent to [facility] on [address] on 04/06/16 but they were both listed as lancets. Please advise. Thanks TNP

## 2016-04-11 NOTE — Telephone Encounter (Signed)
Re sent RX-one touch delica lancets were sent in the right way first time not sure what happened, sent in strips also. Hopefully this will go through this time, per our records they received lancets last time the right ones-aa

## 2016-04-12 ENCOUNTER — Ambulatory Visit (INDEPENDENT_AMBULATORY_CARE_PROVIDER_SITE_OTHER): Payer: PPO | Admitting: Internal Medicine

## 2016-04-12 ENCOUNTER — Encounter: Payer: Self-pay | Admitting: Internal Medicine

## 2016-04-12 VITALS — BP 158/62 | HR 95 | Ht 67.0 in | Wt 219.0 lb

## 2016-04-12 DIAGNOSIS — I110 Hypertensive heart disease with heart failure: Secondary | ICD-10-CM | POA: Diagnosis not present

## 2016-04-12 DIAGNOSIS — I2510 Atherosclerotic heart disease of native coronary artery without angina pectoris: Secondary | ICD-10-CM | POA: Diagnosis not present

## 2016-04-12 DIAGNOSIS — Z7982 Long term (current) use of aspirin: Secondary | ICD-10-CM | POA: Diagnosis not present

## 2016-04-12 DIAGNOSIS — L308 Other specified dermatitis: Secondary | ICD-10-CM | POA: Diagnosis not present

## 2016-04-12 DIAGNOSIS — E119 Type 2 diabetes mellitus without complications: Secondary | ICD-10-CM | POA: Diagnosis not present

## 2016-04-12 DIAGNOSIS — D649 Anemia, unspecified: Secondary | ICD-10-CM | POA: Diagnosis not present

## 2016-04-12 DIAGNOSIS — Z4682 Encounter for fitting and adjustment of non-vascular catheter: Secondary | ICD-10-CM | POA: Diagnosis not present

## 2016-04-12 DIAGNOSIS — G4719 Other hypersomnia: Secondary | ICD-10-CM | POA: Diagnosis not present

## 2016-04-12 DIAGNOSIS — Z794 Long term (current) use of insulin: Secondary | ICD-10-CM | POA: Diagnosis not present

## 2016-04-12 DIAGNOSIS — L408 Other psoriasis: Secondary | ICD-10-CM | POA: Diagnosis not present

## 2016-04-12 DIAGNOSIS — I509 Heart failure, unspecified: Secondary | ICD-10-CM | POA: Diagnosis not present

## 2016-04-12 DIAGNOSIS — E785 Hyperlipidemia, unspecified: Secondary | ICD-10-CM | POA: Diagnosis not present

## 2016-04-12 DIAGNOSIS — Z48815 Encounter for surgical aftercare following surgery on the digestive system: Secondary | ICD-10-CM | POA: Diagnosis not present

## 2016-04-12 DIAGNOSIS — Z9049 Acquired absence of other specified parts of digestive tract: Secondary | ICD-10-CM | POA: Diagnosis not present

## 2016-04-12 NOTE — Patient Instructions (Addendum)
Will send for a sleep study-- in lab--split night.    Sleep Apnea Sleep apnea is disorder that affects a person's sleep. A person with sleep apnea has abnormal pauses in their breathing when they sleep. It is hard for them to get a good sleep. This makes a person tired during the day. It also can lead to other physical problems. There are three types of sleep apnea. One type is when breathing stops for a short time because your airway is blocked (obstructive sleep apnea). Another type is when the brain sometimes fails to give the normal signal to breathe to the muscles that control your breathing (central sleep apnea). The third type is a combination of the other two types. HOME CARE   Take all medicine as told by your doctor.  Avoid alcohol, calming medicines (sedatives), and depressant drugs.  Try to lose weight if you are overweight. Talk to your doctor about a healthy weight goal.  Your doctor may have you use a device that helps to open your airway. It can help you get the air that you need. It is called a positive airway pressure (PAP) device.   MAKE SURE YOU:   Understand these instructions.  Will watch your condition.  Will get help right away if you are not doing well or get worse.  It may take approximately 1 month for you to get used to wearing her CPAP every night.

## 2016-04-12 NOTE — Progress Notes (Addendum)
[facility] Consultation      Assessment and Plan:   Dyspnea. -We'll likely multifactorial due to deconditioning/debility, peripheral vascular disease, obesity. -The patient is currently enrolled in home physical therapy, recommended that he continue. -I also recommended that patient try to increase his activity level, try to get in 20-30 minutes of paced walking every day.  Congestive heart failure. -History of systolic congestive heart failure, currently compensated, may be contributing to dyspnea and respiratory failure.  Excessive daytime sleepiness. -Symptoms and signs of obstructive sleep apnea, including snoring and witnessed apneas. -Given the presence of hypnagogic jerks, possible restless leg syndrome, possible periodic limb movements in sleep, we will request an in lab study.  Hypnagogic jerks. -Frequent, may be related to sleep apnea and excessive daytime sleepiness.  Diabetes mellitus. -OSA can worsen diabetes control, therefore would be important to treat OSA to help with diabetic control.   Peripheral arterial disease, coronary artery disease. -OSA may be contributory to arterial disease.    Date: 04/12/2016  MRN# [id number] ADRION [name] 25-Feb-1925  Referring Physician: self referred.   Joseph [name] is a 82 y.o. old male seen in consultation for chief complaint of:    Chief Complaint  Patient presents with  . sleep consult    self ref     HPI:   Patient is a 82 year old male with a history significant for congestive heart failure, PAD, diabetes mellitus. She has severe triple-vessel coronary artery disease which is being managed medically, EF 45%. He had 4V CABG in '94.  He was admitted to [facility] with gallstones, he had a chole tube placed on 5/4 because his breathing was shallow and his oxygen was in the 80's. He was discharged after 5 days, then got worse within 1 day, he was taken back to [facility] because he could not  breathe well. He was discharged after 2 days to rehab. They took him out of there early after 5 days because they did not feel that he was being taken care of.   Since then he has been doing better, he has been sleeping with 3 pillows.  They note that he has been having jerking in his legs and his body as he is nodding off to sleep. Wife also notes that he stops breathing in his sleep. He is sleepy during the day, naps often during the day. After waking from bed, he walks to his recliner and falls asleep.   He has been winded with activity, before this happened he could go to [employer] but would get a cart because his legs would get heavy and he would get winded. He was a smoker, last smoked in 1982. He could last walk a [employer] around 2014. He was diagnosed with childhood asthma.  His wife and daughter are present and note that he is getting home PT.   His oxygen level has been checked at home and he ranges from 90 to 93%. Review of CXR 03/22/16 showed baseline atelectasis; there is elevated right hemidiaphragm.   Patient usually goes to be around 11 pm, falls asleep quickly. Usually gets out of bed around 8 am. Epworth score is 17.   Baseline sat on RA was 99% and HR 58. He walked at a moderate pace with moderate dyspnea; his sat was 97% and HR 96. He had a long recovery time.   Most recent chest x-ray film was reviewed today, this showed elevated right-sided diaphragm, mild bibasilar atelectasis, otherwise unremarkable.  PMHX:  Past Medical History  Diagnosis Date  . CAD (coronary artery disease)     a. s/p CABG;  b. 07/2012 Cath: 3VD including 80 dLCX (med Rx), 4/4 patent grafts.  . Diabetes mellitus, type 2 ([place])   . Hyperlipidemia   . HTN (hypertension)   . Hypothyroidism   . Chronic diastolic CHF (congestive heart failure) ([place])     a. 07/2012 Echo: EF 55-60%, no rwma, Gr 1 DD, mild MR;  04/2014 Echo: EF 55-60%, no rwma, mild MR, mildly dil LA.  Marland Kitchen Lower extremity edema     a. 03/2014  amlodipine d/c'd.   Surgical Hx:  Past Surgical History  Procedure Laterality Date  . Prostate biopsy    . Hemorrhoid surgery    . Appendectomy    . Toe surgery      ingrown toe nail  . Coronary artery bypass graft  1994  . Cardiac catheterization  07/2012    [facility]/no stents  . Corneal transplant     Family Hx:  Family History  Problem Relation Age of Onset  . Heart failure Mother   . Heart attack Father   . Heart failure Maternal Uncle   . Heart failure Maternal Grandfather    Social Hx:   Social History  Substance Use Topics  . Smoking status: Former Smoker -- 1.00 packs/day    Types: Cigarettes    Quit date: 12/23/1980  . Smokeless tobacco: Never Used  . Alcohol Use: No   Medication:   Current Outpatient Rx  Name  Route  Sig  Dispense  Refill  . Ascorbic Acid (VITAMIN C) 100 MG tablet   Oral   Take by mouth.         Marland Kitchen aspirin EC 81 MG tablet   Oral   Take 1 tablet (81 mg total) by mouth daily.   90 tablet   3   . atorvastatin (LIPITOR) 40 MG tablet   Oral   Take 1 tablet (40 mg total) by mouth daily.   90 tablet   3   . blood glucose meter kit and supplies KIT      Dispense Accuchek Aviva glucometer with 120 test strips.  Test glucose level 3 times per day with meals and at bedtime.  Dispense based on patient and insurance preference. Use up to four times daily as directed. (FOR ICD-9 250.00, 250.01, ICD 10 E11.9).   1 each   0   . carvedilol (COREG) 3.125 MG tablet   Oral   Take 1 tablet (3.125 mg total) by mouth 2 (two) times daily with a meal.   60 tablet   0   . cephALEXin (KEFLEX) 500 MG capsule   Oral   Take 1 capsule (500 mg total) by mouth 2 (two) times daily.   14 capsule   0   . cholecalciferol (VITAMIN D) 1000 UNITS tablet   Oral   Take 2,000 Units by mouth daily.         . ciclopirox (PENLAC) 8 % solution               . doxazosin (CARDURA) 4 MG tablet   Oral   Take 4 mg by mouth 2 (two) times daily.         Marland Kitchen  glucose blood (ONE TOUCH ULTRA TEST) test strip      Check sugar 4 times daily DX:E 11.51   100 each   12   . hydrALAZINE (APRESOLINE) 25 MG tablet   Oral  Take 1 tablet (25 mg total) by mouth 2 (two) times daily.   60 tablet   12   . HYDROcodone-acetaminophen (NORCO) 5-325 MG tablet   Oral   Take 1-2 tablets by mouth every 6 (six) hours as needed for moderate pain or severe pain.   30 tablet   0   . insulin aspart (NOVOLOG) 100 UNIT/ML injection   Subcutaneous   Inject 4 Units into the skin 3 (three) times daily before meals.   10 mL      . insulin aspart (NOVOLOG) 100 UNIT/ML injection   Subcutaneous   Inject 0-9 Units into the skin 3 (three) times daily with meals.   10 mL   0   . insulin glargine (LANTUS) 100 UNIT/ML injection   Subcutaneous   Inject 0.68 mLs (68 Units total) into the skin at bedtime.   10 mL   0   . levothyroxine (SYNTHROID, LEVOTHROID) 50 MCG tablet   Oral   Take 50 mcg by mouth daily before breakfast.         . Magnesium 250 MG TABS   Oral   Take by mouth.         . nitroGLYCERIN (NITROSTAT) 0.4 MG SL tablet   Sublingual   Place 1 tablet (0.4 mg total) under the tongue every 5 (five) minutes as needed for chest pain.   25 tablet   3   . omeprazole (PRILOSEC) 20 MG capsule   Oral   Take 20 mg by mouth daily.         Glory Rosebush DELICA LANCETS 09U MISC      Check sugar 4 times daily. DX E11.51   100 each   12       Allergies:  Review of patient's allergies indicates no known allergies.  Review of Systems: Gen:  Denies  fever, sweats, chills HEENT: Denies blurred vision, double vision.  Cvc:  No dizziness, chest pain. Resp:   Denies cough or sputum production,  Gi: Denies swallowing difficulty, stomach pain. Gu:  Denies bladder incontinence, burning urine Ext:   No Joint pain, stiffness. Skin: No skin rash,  hives  Endoc:  No polyuria, polydipsia. Psych: No depression, insomnia. Other:  All other systems were  reviewed with the patient and were negative other that what is mentioned in the HPI.   Physical Examination:   VS: BP 158/62 mmHg  Pulse 95  Ht 5' 7"  (1.702 m)  Wt 219 lb (99.338 kg)  BMI 34.29 kg/m2  SpO2 94%  General Appearance: No distress  Neuro:without focal findings,  speech normal,  HEENT: PERRLA, EOM intact.  Mallampati 2. Pulmonary: normal breath sounds, No wheezing.  CardiovascularNormal S1,S2.  No m/r/g.   Abdomen: Benign, Soft, non-tender.Cholecystostomy tube in place. Renal:  No costovertebral tenderness  GU:  No performed at this time. Endoc: No evident thyromegaly, no signs of acromegaly. Skin:   warm, no rashes, no ecchymosis  Extremities: normal, no cyanosis, clubbing.  Other findings:    LABORATORY PANEL:   CBC No results for input(s): WBC, HGB, HCT, PLT in the last 168 hours. ------------------------------------------------------------------------------------------------------------------  Chemistries  No results for input(s): NA, K, CL, CO2, GLUCOSE, BUN, CREATININE, CALCIUM, MG, AST, ALT, ALKPHOS, BILITOT in the last 168 hours.  Invalid input(s): GFRCGP ------------------------------------------------------------------------------------------------------------------  Cardiac Enzymes No results for input(s): TROPONINI in the last 168 hours. ------------------------------------------------------------  RADIOLOGY:  No results found.     Thank  you for the consultation and for allowing [facility] to assist in the care of your patient. Our recommendations are noted above.  Please contact us if we can be of further service.   Marda Stalker, MD.  Board Certified in Internal Medicine, Pulmonary Medicine, [place], and Sleep Medicine.  [facility] Office Number: [phone]  Patricia Pesa, M.D.  Vilinda Boehringer, M.D.  Merton Border, M.D  04/12/2016

## 2016-04-14 DIAGNOSIS — I110 Hypertensive heart disease with heart failure: Secondary | ICD-10-CM | POA: Diagnosis not present

## 2016-04-14 DIAGNOSIS — I509 Heart failure, unspecified: Secondary | ICD-10-CM | POA: Diagnosis not present

## 2016-04-14 DIAGNOSIS — I2510 Atherosclerotic heart disease of native coronary artery without angina pectoris: Secondary | ICD-10-CM | POA: Diagnosis not present

## 2016-04-14 DIAGNOSIS — D649 Anemia, unspecified: Secondary | ICD-10-CM | POA: Diagnosis not present

## 2016-04-14 DIAGNOSIS — L408 Other psoriasis: Secondary | ICD-10-CM | POA: Diagnosis not present

## 2016-04-14 DIAGNOSIS — L308 Other specified dermatitis: Secondary | ICD-10-CM | POA: Diagnosis not present

## 2016-04-14 DIAGNOSIS — Z7982 Long term (current) use of aspirin: Secondary | ICD-10-CM | POA: Diagnosis not present

## 2016-04-14 DIAGNOSIS — Z794 Long term (current) use of insulin: Secondary | ICD-10-CM | POA: Diagnosis not present

## 2016-04-14 DIAGNOSIS — E785 Hyperlipidemia, unspecified: Secondary | ICD-10-CM | POA: Diagnosis not present

## 2016-04-14 DIAGNOSIS — E119 Type 2 diabetes mellitus without complications: Secondary | ICD-10-CM | POA: Diagnosis not present

## 2016-04-14 DIAGNOSIS — Z4682 Encounter for fitting and adjustment of non-vascular catheter: Secondary | ICD-10-CM | POA: Diagnosis not present

## 2016-04-14 DIAGNOSIS — Z48815 Encounter for surgical aftercare following surgery on the digestive system: Secondary | ICD-10-CM | POA: Diagnosis not present

## 2016-04-14 DIAGNOSIS — Z9049 Acquired absence of other specified parts of digestive tract: Secondary | ICD-10-CM | POA: Diagnosis not present

## 2016-04-15 ENCOUNTER — Other Ambulatory Visit: Payer: Self-pay | Admitting: Family Medicine

## 2016-04-15 ENCOUNTER — Telehealth: Payer: Self-pay | Admitting: Family Medicine

## 2016-04-15 DIAGNOSIS — B354 Tinea corporis: Secondary | ICD-10-CM | POA: Diagnosis not present

## 2016-04-15 DIAGNOSIS — E1151 Type 2 diabetes mellitus with diabetic peripheral angiopathy without gangrene: Secondary | ICD-10-CM

## 2016-04-15 DIAGNOSIS — L400 Psoriasis vulgaris: Secondary | ICD-10-CM | POA: Diagnosis not present

## 2016-04-15 DIAGNOSIS — L538 Other specified erythematous conditions: Secondary | ICD-10-CM | POA: Diagnosis not present

## 2016-04-15 DIAGNOSIS — L820 Inflamed seborrheic keratosis: Secondary | ICD-10-CM | POA: Diagnosis not present

## 2016-04-15 DIAGNOSIS — L298 Other pruritus: Secondary | ICD-10-CM | POA: Diagnosis not present

## 2016-04-15 MED ORDER — ONETOUCH DELICA LANCETS 33G MISC: Status: DC

## 2016-04-15 MED ORDER — GLUCOSE BLOOD VI STRP: ORAL_STRIP | Status: DC

## 2016-04-15 NOTE — Telephone Encounter (Signed)
Pt's daughter Joseph [name] called requesting refills on lancets and testing strips sent into [facility] on [address].Pt is completing out  of testing strips and having to guess how much insulin to give.Vickie's call back is [phone]

## 2016-04-19 ENCOUNTER — Telehealth: Payer: Self-pay | Admitting: *Deleted

## 2016-04-19 DIAGNOSIS — L408 Other psoriasis: Secondary | ICD-10-CM | POA: Diagnosis not present

## 2016-04-19 DIAGNOSIS — E785 Hyperlipidemia, unspecified: Secondary | ICD-10-CM | POA: Diagnosis not present

## 2016-04-19 DIAGNOSIS — L308 Other specified dermatitis: Secondary | ICD-10-CM | POA: Diagnosis not present

## 2016-04-19 DIAGNOSIS — I509 Heart failure, unspecified: Secondary | ICD-10-CM | POA: Diagnosis not present

## 2016-04-19 DIAGNOSIS — Z9049 Acquired absence of other specified parts of digestive tract: Secondary | ICD-10-CM | POA: Diagnosis not present

## 2016-04-19 DIAGNOSIS — Z7982 Long term (current) use of aspirin: Secondary | ICD-10-CM | POA: Diagnosis not present

## 2016-04-19 DIAGNOSIS — I110 Hypertensive heart disease with heart failure: Secondary | ICD-10-CM | POA: Diagnosis not present

## 2016-04-19 DIAGNOSIS — Z4682 Encounter for fitting and adjustment of non-vascular catheter: Secondary | ICD-10-CM | POA: Diagnosis not present

## 2016-04-19 DIAGNOSIS — Z48815 Encounter for surgical aftercare following surgery on the digestive system: Secondary | ICD-10-CM | POA: Diagnosis not present

## 2016-04-19 DIAGNOSIS — Z794 Long term (current) use of insulin: Secondary | ICD-10-CM | POA: Diagnosis not present

## 2016-04-19 DIAGNOSIS — I2510 Atherosclerotic heart disease of native coronary artery without angina pectoris: Secondary | ICD-10-CM | POA: Diagnosis not present

## 2016-04-19 DIAGNOSIS — E119 Type 2 diabetes mellitus without complications: Secondary | ICD-10-CM | POA: Diagnosis not present

## 2016-04-19 DIAGNOSIS — D649 Anemia, unspecified: Secondary | ICD-10-CM | POA: Diagnosis not present

## 2016-04-20 ENCOUNTER — Telehealth: Payer: Self-pay | Admitting: Family Medicine

## 2016-04-20 NOTE — Telephone Encounter (Signed)
Esther with [facility] states pt wife declined an OT evaluation, stating pt did not need this service.  [id number]

## 2016-04-20 NOTE — Telephone Encounter (Signed)
FYI

## 2016-04-21 ENCOUNTER — Telehealth: Payer: Self-pay | Admitting: Family Medicine

## 2016-04-21 ENCOUNTER — Ambulatory Visit (INDEPENDENT_AMBULATORY_CARE_PROVIDER_SITE_OTHER): Payer: PPO | Admitting: Family Medicine

## 2016-04-21 ENCOUNTER — Institutional Professional Consult (permissible substitution): Payer: PPO | Admitting: Pulmonary Disease

## 2016-04-21 VITALS — BP 102/50 | HR 64 | Temp 98.3°F | Resp 14 | Wt 221.0 lb

## 2016-04-21 DIAGNOSIS — I110 Hypertensive heart disease with heart failure: Secondary | ICD-10-CM | POA: Diagnosis not present

## 2016-04-21 DIAGNOSIS — L409 Psoriasis, unspecified: Secondary | ICD-10-CM | POA: Diagnosis not present

## 2016-04-21 DIAGNOSIS — N189 Chronic kidney disease, unspecified: Secondary | ICD-10-CM | POA: Diagnosis not present

## 2016-04-21 DIAGNOSIS — I509 Heart failure, unspecified: Secondary | ICD-10-CM | POA: Diagnosis not present

## 2016-04-21 DIAGNOSIS — Z794 Long term (current) use of insulin: Secondary | ICD-10-CM | POA: Diagnosis not present

## 2016-04-21 DIAGNOSIS — Z9049 Acquired absence of other specified parts of digestive tract: Secondary | ICD-10-CM | POA: Diagnosis not present

## 2016-04-21 DIAGNOSIS — L308 Other specified dermatitis: Secondary | ICD-10-CM | POA: Diagnosis not present

## 2016-04-21 DIAGNOSIS — E669 Obesity, unspecified: Secondary | ICD-10-CM

## 2016-04-21 DIAGNOSIS — L408 Other psoriasis: Secondary | ICD-10-CM | POA: Diagnosis not present

## 2016-04-21 DIAGNOSIS — K810 Acute cholecystitis: Secondary | ICD-10-CM

## 2016-04-21 DIAGNOSIS — Z09 Encounter for follow-up examination after completed treatment for conditions other than malignant neoplasm: Secondary | ICD-10-CM

## 2016-04-21 DIAGNOSIS — Z4682 Encounter for fitting and adjustment of non-vascular catheter: Secondary | ICD-10-CM | POA: Diagnosis not present

## 2016-04-21 DIAGNOSIS — I10 Essential (primary) hypertension: Secondary | ICD-10-CM

## 2016-04-21 DIAGNOSIS — K5732 Diverticulitis of large intestine without perforation or abscess without bleeding: Secondary | ICD-10-CM

## 2016-04-21 DIAGNOSIS — I2510 Atherosclerotic heart disease of native coronary artery without angina pectoris: Secondary | ICD-10-CM | POA: Diagnosis not present

## 2016-04-21 DIAGNOSIS — E1151 Type 2 diabetes mellitus with diabetic peripheral angiopathy without gangrene: Secondary | ICD-10-CM

## 2016-04-21 DIAGNOSIS — E119 Type 2 diabetes mellitus without complications: Secondary | ICD-10-CM | POA: Diagnosis not present

## 2016-04-21 DIAGNOSIS — Z7982 Long term (current) use of aspirin: Secondary | ICD-10-CM | POA: Diagnosis not present

## 2016-04-21 DIAGNOSIS — E785 Hyperlipidemia, unspecified: Secondary | ICD-10-CM | POA: Diagnosis not present

## 2016-04-21 DIAGNOSIS — D649 Anemia, unspecified: Secondary | ICD-10-CM | POA: Diagnosis not present

## 2016-04-21 DIAGNOSIS — Z48815 Encounter for surgical aftercare following surgery on the digestive system: Secondary | ICD-10-CM | POA: Diagnosis not present

## 2016-04-21 NOTE — Telephone Encounter (Signed)
Please review-aa 

## 2016-04-21 NOTE — Progress Notes (Signed)
Patient ID: Joseph [name], male   DOB: 07-08-1925, 82 y.o.   MRN: [id number]    Subjective:  HPI  Patient is here for hospital follow up. Patient stayed at [facility] dates are 5/1-03/28/16. Diagnoses at discharge:  Acute cholecystitis. Did CT showed mild acute sigmoid diverticulitis, he was treated with IV Zosyn and changed to by mouth Keflex, HIDA scan was positive for acute cholecystitis.     Acute respiratory distress with hypoxia-repeat chest xray showed no evidence of pneumonia or CHF, oxygen was weaned off.     Acute on chronic kidney disease stage II His Torsemide was stopped due to acute kidney injury. Also stopped Lisinopril and Potassium chloride per ER notes but patient and his daughter state they were not told to do this. Per ER notes patient presented with chest and abdominal pain and found to have diverticulitis, he had cholecystomy done there too.  Patient ended up going back to the hospital but in [facility] and dates of been there are 5/9-5/11. He hd a beginning f pneumonia and was treated for that. From there he was discharged to [facility] where he stayed for 4 nights and had terrible service there per patient and daughter. His draining tube was not checked and dressings were not replaced. Patient ended up going to [facility] after 4 nights due to his draining tube was not draining. This was taking care of there.  Patient feels better now. He is no longer on any antibiotics. He has an appointment to get tube taking out on June 12th with specialist.  Patient's daughter has questions about his diabetes and management for that.  Prior to Admission medications   Medication Sig Start Date End Date Taking? Authorizing Provider  Ascorbic Acid (VITAMIN C) 100 MG tablet Take by mouth.    Historical Provider, MD  aspirin EC 81 MG tablet Take 1 tablet (81 mg total) by mouth daily. 03/17/16   Areta Haber Dunn, PA-C  atorvastatin (LIPITOR) 40 MG tablet Take 1 tablet (40 mg total) by  mouth daily. 10/26/15   Minna Merritts, MD  blood glucose meter kit and supplies KIT Dispense Accuchek Aviva glucometer with 120 test strips.  Test glucose level 3 times per day with meals and at bedtime.  Dispense based on patient and insurance preference. Use up to four times daily as directed. (FOR ICD-9 250.00, 250.01, ICD 10 E11.9). 03/28/16   Lance Coon, MD  carvedilol (COREG) 3.125 MG tablet Take 1 tablet (3.125 mg total) by mouth 2 (two) times daily with a meal. 03/29/16   Demetrios Loll, MD  cholecalciferol (VITAMIN D) 1000 UNITS tablet Take 2,000 Units by mouth daily.    Historical Provider, MD  ciclopirox (PENLAC) 8 % solution     Historical Provider, MD  doxazosin (CARDURA) 4 MG tablet Take 4 mg by mouth 2 (two) times daily.    Historical Provider, MD  glucose blood (ONE TOUCH ULTRA TEST) test strip Check sugar 4 times daily DX:E 11.51 04/15/16   Margarita Rana, MD  hydrALAZINE (APRESOLINE) 25 MG tablet Take 1 tablet (25 mg total) by mouth 2 (two) times daily. 07/28/15   Senaya Dicenso Maceo Pro., MD  HYDROcodone-acetaminophen (NORCO) 5-325 MG tablet Take 1-2 tablets by mouth every 6 (six) hours as needed for moderate pain or severe pain. 03/28/16   Gladstone Lighter, MD  insulin aspart (NOVOLOG) 100 UNIT/ML injection Inject 4 Units into the skin 3 (three) times daily before meals. 03/28/16   Demetrios Loll, MD  insulin aspart (NOVOLOG) 100 UNIT/ML injection Inject 0-9 Units into the skin 3 (three) times daily with meals. 03/28/16   Demetrios Loll, MD  insulin glargine (LANTUS) 100 UNIT/ML injection Inject 0.68 mLs (68 Units total) into the skin at bedtime. 03/28/16   Demetrios Loll, MD  levothyroxine (SYNTHROID, LEVOTHROID) 50 MCG tablet Take 25 mcg by mouth daily before breakfast.     Historical Provider, MD  Magnesium 250 MG TABS Take by mouth.    Historical Provider, MD  nitroGLYCERIN (NITROSTAT) 0.4 MG SL tablet Place 1 tablet (0.4 mg total) under the tongue every 5 (five) minutes as needed for chest pain. 04/29/14    Rogelia Mire, NP  omeprazole (PRILOSEC) 20 MG capsule Take 20 mg by mouth daily.    Historical Provider, MD  [facility] DELICA LANCETS 94W MISC Check sugar 4 times daily. DX E11.51 04/15/16   Margarita Rana, MD    Patient Active Problem List   Diagnosis Date Noted  . Acute cholecystitis   . Abdominal pain   . Calculus of gallbladder with acute cholecystitis without obstruction   . Diverticulitis of large intestine without perforation or abscess without bleeding   . Pain of upper abdomen   . SOB (shortness of breath)   . S/P CABG (coronary artery bypass graft)   . Coronary artery disease involving coronary bypass graft of native heart with unspecified angina pectoris   . Morbid obesity due to excess calories ([place])   . Acute respiratory failure with hypoxia ([place]) 03/21/2016  . Back pain, chronic 07/28/2015  . Benign fibroma of prostate 07/28/2015  . Acid reflux 07/28/2015  . Adult hypothyroidism 07/28/2015  . Eunuchoidism 07/28/2015  . Neuropathy ([place]) 07/28/2015  . Arthritis, degenerative 07/28/2015  . Psoriasis 07/28/2015  . Obesity 09/09/2014  . Bilateral leg weakness 09/09/2014  . Diastolic CHF, acute on chronic ([place]) 05/13/2014  . Bilateral leg edema 04/15/2014  . Bradycardia 08/13/2013  . Edema 06/21/2012  . Fatigue 09/15/2011  . Diabetes mellitus with peripheral vascular disease ([place]) 09/15/2011  . DYSPNEA 06/09/2009  . Hyperlipidemia 06/07/2009  . HYPERTENSION, BENIGN 06/07/2009  . CAD, ARTERY BYPASS GRAFT 06/07/2009    Past Medical History  Diagnosis Date  . CAD (coronary artery disease)     a. s/p CABG;  b. 07/2012 Cath: 3VD including 80 dLCX (med Rx), 4/4 patent grafts.  . Diabetes mellitus, type 2 ([place])   . Hyperlipidemia   . HTN (hypertension)   . Hypothyroidism   . Chronic diastolic CHF (congestive heart failure) ([place])     a. 07/2012 Echo: EF 55-60%, no rwma, Gr 1 DD, mild MR;  04/2014 Echo: EF 55-60%, no rwma, mild MR, mildly dil LA.  Marland Kitchen Lower extremity  edema     a. 03/2014 amlodipine d/c'd.    Social History   Social History  . Marital Status: Married    Spouse Name: N/A  . Number of Children: N/A  . Years of Education: N/A   Occupational History  . Not on file.   Social History Main Topics  . Smoking status: Former Smoker -- 0.00 packs/day for 6 years    Types: Pipe, Cigars    Quit date: 12/23/1980  . Smokeless tobacco: Never Used  . Alcohol Use: No  . Drug Use: No  . Sexual Activity: No   Other Topics Concern  . Not on file   Social History Narrative   Retired.     No Known Allergies  Review of Systems  Constitutional: Positive for malaise/fatigue.  Negative for fever and chills.  Respiratory: Negative.   Cardiovascular: Negative.   Gastrointestinal: Negative.        Has draining tube placed in the right abdomin area, incision present  Musculoskeletal: Negative.   Skin: Negative.   Endo/Heme/Allergies:       Cold intolerance    Immunization History  Administered Date(s) Administered  . Influenza, High Dose Seasonal PF 09/01/2015   Objective:  BP 102/50 mmHg  Pulse 64  Temp(Src) 98.3 F (36.8 C)  Resp 14  Wt 221 lb (100.245 kg)  SpO2 97%  Physical Exam  Constitutional: He is oriented to person, place, and time and well-developed, well-nourished, and in no distress.  Eyes: Conjunctivae are normal. Pupils are equal, round, and reactive to light.  Neck: Normal range of motion. Neck supple.  Cardiovascular: Normal rate, regular rhythm, normal heart sounds and intact distal pulses.   No murmur heard. Pulmonary/Chest: Effort normal and breath sounds normal. No respiratory distress. He has no wheezes.  Abdominal: There is no tenderness.  incision looks good where the tube is placed.  Neurological: He is alert and oriented to person, place, and time.  Skin: No rash noted. No erythema.  Psychiatric: Mood, memory, affect and judgment normal.    Lab Results  Component Value Date   WBC 11.0* 03/27/2016     HGB 10.9* 03/27/2016   HCT 32.0* 03/27/2016   PLT 202 03/27/2016   GLUCOSE 80 03/27/2016   CHOL 148 07/06/2015   TRIG 191* 07/06/2015   HDL 36 07/06/2015   LDLCALC 91 07/06/2015   INR 1.37 03/24/2016   HGBA1C 7.1* 07/06/2015    CMP     Component Value Date/Time   NA 138 03/27/2016 0454   NA 142 07/06/2015   K 3.8 03/27/2016 0454   CL 102 03/27/2016 0454   CO2 27 03/27/2016 0454   GLUCOSE 80 03/27/2016 0454   GLUCOSE 184* 05/06/2014 1430   BUN 42* 03/27/2016 0454   BUN 16 07/06/2015   CREATININE 1.30* 03/27/2016 0454   CREATININE 1.0 07/06/2015   CALCIUM 8.2* 03/27/2016 0454   PROT 6.4* 03/24/2016 0424   PROT 6.0 02/15/2013 0917   ALBUMIN 2.8* 03/24/2016 0424   ALBUMIN 3.6 02/15/2013 0917   AST 24 03/24/2016 0424   ALT 20 03/24/2016 0424   ALKPHOS 56 03/24/2016 0424   BILITOT 1.0 03/24/2016 0424   GFRNONAA 47* 03/27/2016 0454   GFRAA 55* 03/27/2016 0454    Assessment and Plan :  1. Hospital discharge follow-up Reviewed records.  2. Diverticulitis of large intestine without perforation or abscess without bleeding  3. Acute cholecystitis Had cholecystomy.  4. Psoriasis  5. Obesity  6. Status post cholecystectomy Seen specialist on June 12th and is having draining tube taking out.  7. Diabetes mellitus with peripheral vascular disease ([place]) Patient is having hypoglycemic episodes-advised patient and daughter to decrease Lantus in the evening to 60 units. Will refer to endocrinologist.  8. CKD (chronic kidney disease), unspecified stage Will check labs and may need to stop Torsemide-advised patient and daughter patient should only be taking it if has edema but will check labs and decide from there. 9.CAD Per cardiology. Patient was seen and examined by Dr. Eulas Post and note was scribed by Theressa Millard, RMA.    Miguel Aschoff MD [facility] 04/21/2016 2:14 PM

## 2016-04-21 NOTE — Telephone Encounter (Signed)
Pt's daughter called in saying that they were just in the office and Dr. Rosanna Randy told Mr. Spilsbury to stop taking the Demadex table.  They want to know does he need to stop taking the potassium and magnesium since he was taking these because of side effects from the Demadex.  Please advise.  [phone].  Thanks [employer]

## 2016-04-22 ENCOUNTER — Other Ambulatory Visit: Payer: Self-pay

## 2016-04-22 LAB — COMPREHENSIVE METABOLIC PANEL
A/G RATIO: 1.1 — AB (ref 1.2–2.2)
ALBUMIN: 3.5 g/dL (ref 3.5–4.7)
ALT: 15 IU/L (ref 0–44)
AST: 16 IU/L (ref 0–40)
Alkaline Phosphatase: 62 IU/L (ref 39–117)
BUN / CREAT RATIO: 19 (ref 10–24)
BUN: 19 mg/dL (ref 8–27)
Bilirubin Total: 0.3 mg/dL (ref 0.0–1.2)
CALCIUM: 9.1 mg/dL (ref 8.6–10.2)
CO2: 24 mmol/L (ref 18–29)
CREATININE: 1.01 mg/dL (ref 0.76–1.27)
Chloride: 101 mmol/L (ref 96–106)
GFR, EST AFRICAN AMERICAN: 76 mL/min/{1.73_m2} (ref >59)
GFR, EST NON AFRICAN AMERICAN: 66 mL/min/{1.73_m2} (ref >59)
GLOBULIN, TOTAL: 3.1 g/dL (ref 1.5–4.5)
Glucose: 121 mg/dL — ABNORMAL HIGH (ref 65–99)
POTASSIUM: 4.5 mmol/L (ref 3.5–5.2)
SODIUM: 140 mmol/L (ref 134–144)
TOTAL PROTEIN: 6.6 g/dL (ref 6.0–8.5)

## 2016-04-22 LAB — TSH: TSH: 1.92 u[IU]/mL (ref 0.450–4.500)

## 2016-04-22 LAB — CBC WITH DIFFERENTIAL/PLATELET
BASOS: 1 %
Basophils Absolute: 0.1 10*3/uL (ref 0.0–0.2)
EOS (ABSOLUTE): 0.4 10*3/uL (ref 0.0–0.4)
EOS: 7 %
HEMATOCRIT: 35.7 % — AB (ref 37.5–51.0)
HEMOGLOBIN: 11.3 g/dL — AB (ref 12.6–17.7)
IMMATURE GRANS (ABS): 0 10*3/uL (ref 0.0–0.1)
IMMATURE GRANULOCYTES: 0 %
Lymphocytes Absolute: 2.5 10*3/uL (ref 0.7–3.1)
Lymphs: 38 %
MCH: 29.3 pg (ref 26.6–33.0)
MCHC: 31.7 g/dL (ref 31.5–35.7)
MCV: 93 fL (ref 79–97)
MONOCYTES: 10 %
MONOS ABS: 0.7 10*3/uL (ref 0.1–0.9)
NEUTROS PCT: 44 %
Neutrophils Absolute: 2.8 10*3/uL (ref 1.4–7.0)
Platelets: 174 10*3/uL (ref 150–379)
RBC: 3.86 x10E6/uL — AB (ref 4.14–5.80)
RDW: 14.3 % (ref 12.3–15.4)
WBC: 6.5 10*3/uL (ref 3.4–10.8)

## 2016-04-22 NOTE — Telephone Encounter (Signed)
Patient's daughter called again regarding below. She also reports that the patient is no longer taking metolazone. Should he be taking this? Please advise. Thanks!

## 2016-04-22 NOTE — Telephone Encounter (Signed)
Joseph [name] advised and medication list updated-aa

## 2016-04-22 NOTE — Telephone Encounter (Signed)
Stop potassium. Continue magnesium.

## 2016-04-22 NOTE — Telephone Encounter (Signed)
Do not take metolazone.

## 2016-04-22 NOTE — Telephone Encounter (Signed)
What about potassium and magnesium?

## 2016-04-26 DIAGNOSIS — E119 Type 2 diabetes mellitus without complications: Secondary | ICD-10-CM | POA: Diagnosis not present

## 2016-04-26 DIAGNOSIS — Z794 Long term (current) use of insulin: Secondary | ICD-10-CM | POA: Diagnosis not present

## 2016-04-28 ENCOUNTER — Telehealth: Payer: Self-pay | Admitting: Family Medicine

## 2016-04-28 NOTE — Telephone Encounter (Signed)
Juanita with [facility] request a call back.  CB#[phone]/MW

## 2016-05-02 DIAGNOSIS — K819 Cholecystitis, unspecified: Secondary | ICD-10-CM | POA: Diagnosis not present

## 2016-05-02 DIAGNOSIS — Z434 Encounter for attention to other artificial openings of digestive tract: Secondary | ICD-10-CM | POA: Diagnosis not present

## 2016-05-03 ENCOUNTER — Other Ambulatory Visit: Payer: Self-pay | Admitting: Family Medicine

## 2016-05-04 ENCOUNTER — Ambulatory Visit: Payer: PPO | Admitting: Cardiovascular Disease

## 2016-05-06 ENCOUNTER — Ambulatory Visit: Payer: PPO

## 2016-05-09 DIAGNOSIS — K8020 Calculus of gallbladder without cholecystitis without obstruction: Secondary | ICD-10-CM | POA: Diagnosis not present

## 2016-05-10 ENCOUNTER — Telehealth: Payer: Self-pay | Admitting: Family Medicine

## 2016-05-10 DIAGNOSIS — E039 Hypothyroidism, unspecified: Secondary | ICD-10-CM | POA: Diagnosis not present

## 2016-05-10 DIAGNOSIS — I25810 Atherosclerosis of coronary artery bypass graft(s) without angina pectoris: Secondary | ICD-10-CM | POA: Diagnosis not present

## 2016-05-10 DIAGNOSIS — Z794 Long term (current) use of insulin: Secondary | ICD-10-CM | POA: Diagnosis not present

## 2016-05-10 DIAGNOSIS — I10 Essential (primary) hypertension: Secondary | ICD-10-CM | POA: Diagnosis not present

## 2016-05-10 DIAGNOSIS — E118 Type 2 diabetes mellitus with unspecified complications: Secondary | ICD-10-CM | POA: Diagnosis not present

## 2016-05-10 DIAGNOSIS — Z01818 Encounter for other preprocedural examination: Secondary | ICD-10-CM | POA: Diagnosis not present

## 2016-05-10 MED ORDER — CARVEDILOL 3.125 MG PO TABS: 3.1250 mg | ORAL_TABLET | Freq: Two times a day (BID) | ORAL | Status: DC

## 2016-05-10 NOTE — Telephone Encounter (Signed)
Medication sent to pharmacy. Pt wife informed.

## 2016-05-10 NOTE — Telephone Encounter (Signed)
Refill medication?-aa

## 2016-05-10 NOTE — Telephone Encounter (Signed)
Ok to rf for 1 year. 

## 2016-05-10 NOTE — Telephone Encounter (Signed)
Daughter called saying Joseph [name] is having gallbladder surgery this Friday the 23rd at [facility].  She also stated he needs a refill carvedilol (COREG) 3.125 MG tablet if you feel he needs to continue taking this medication.  He uses [facility]  Daughters call back is [phone]  Please let her know if you want him to continue on this medication.  Thanks [employer]

## 2016-05-11 ENCOUNTER — Ambulatory Visit (INDEPENDENT_AMBULATORY_CARE_PROVIDER_SITE_OTHER): Payer: PPO | Admitting: Family Medicine

## 2016-05-11 ENCOUNTER — Encounter: Payer: Self-pay | Admitting: Family Medicine

## 2016-05-11 ENCOUNTER — Telehealth: Payer: Self-pay | Admitting: Family Medicine

## 2016-05-11 VITALS — BP 122/58 | Temp 98.1°F | Wt 219.0 lb

## 2016-05-11 DIAGNOSIS — I2510 Atherosclerotic heart disease of native coronary artery without angina pectoris: Secondary | ICD-10-CM

## 2016-05-11 DIAGNOSIS — Z794 Long term (current) use of insulin: Secondary | ICD-10-CM | POA: Diagnosis not present

## 2016-05-11 DIAGNOSIS — F419 Anxiety disorder, unspecified: Secondary | ICD-10-CM | POA: Diagnosis not present

## 2016-05-11 DIAGNOSIS — E1122 Type 2 diabetes mellitus with diabetic chronic kidney disease: Secondary | ICD-10-CM | POA: Diagnosis not present

## 2016-05-11 DIAGNOSIS — L918 Other hypertrophic disorders of the skin: Secondary | ICD-10-CM | POA: Diagnosis not present

## 2016-05-11 DIAGNOSIS — I5032 Chronic diastolic (congestive) heart failure: Secondary | ICD-10-CM | POA: Diagnosis not present

## 2016-05-11 NOTE — Patient Instructions (Addendum)
May take Torsemide as needed for swelling.

## 2016-05-11 NOTE — Progress Notes (Signed)
Patient ID: Joseph [name], male   DOB: [birth year], 82 y.o.   MRN: [id number]       Patient: Joseph [name] Male    DOB: [birth year]   82 y.o.   MRN: [id number] Visit Date: 05/11/2016  Today's Provider: Wilhemena Durie, MD   Chief Complaint  Patient presents with  . Tick Removal   Subjective:    HPI Patient is here for evaluation of a tick bite. He reports that he first noticed it last night, and it is located in his groin area. Patient denies any rash, fever, or joint pain.     No Known Allergies No outpatient prescriptions have been marked as taking for the 05/11/16 encounter (Office Visit) with [name]., MD.    Review of Systems  Constitutional: Negative for fever, chills, diaphoresis, activity change, appetite change and unexpected weight change.  Eyes: Negative.   Respiratory: Negative.   Cardiovascular: Negative.   Endocrine: Negative.   Musculoskeletal: Negative for myalgias, back pain, joint swelling, arthralgias, gait problem, neck pain and neck stiffness.  Allergic/Immunologic: Negative.   Hematological: Negative.   Psychiatric/Behavioral: Negative.     Social History  Substance Use Topics  . Smoking status: Former Smoker -- 0.00 packs/day for 6 years    Types: Pipe, Cigars    Quit date: 12/23/1980  . Smokeless tobacco: Never Used  . Alcohol Use: No   Objective:   BP 122/58 mmHg  Temp(Src) 98.1 F (36.7 C)  Wt 219 lb (99.338 kg)  Physical Exam  Constitutional: He is oriented to person, place, and time. He appears well-developed and well-nourished.  HENT:  Head: Normocephalic and atraumatic.  Cardiovascular: Normal rate, regular rhythm and normal heart sounds.   Pulmonary/Chest: Effort normal and breath sounds normal.  Abdominal: Soft.  Neurological: He is alert and oriented to person, place, and time. Abnormal muscle tone: Skin TAG.  Skin: Skin is warm and dry.  Visual inspection of groin penis and scrotal sac reveals normal  exam. No tic, bug, or bug bite noted. Hemangioma on left side of scrotal sac with small cysts noted. Skin tag of the right upper inner thigh.  Psychiatric: He has a normal mood and affect. Thought content normal.        Assessment & Plan:     Skin Tag CAD Patient for surgical clearance by Dr. Rockey Situ  later today from cardiology [place] [facility] Patient for cholecystectomy in [facility] when cleared by cardiology. TIIDM   Per Endocrinology. I have done the exam and reviewed the above chart and it is accurate to the best of my knowledge.    Richard Cranford Mon, MD  [facility]

## 2016-05-12 DIAGNOSIS — Z87891 Personal history of nicotine dependence: Secondary | ICD-10-CM | POA: Diagnosis not present

## 2016-05-12 DIAGNOSIS — Y838 Other surgical procedures as the cause of abnormal reaction of the patient, or of later complication, without mention of misadventure at the time of the procedure: Secondary | ICD-10-CM | POA: Diagnosis not present

## 2016-05-12 DIAGNOSIS — T85898A Other specified complication of other internal prosthetic devices, implants and grafts, initial encounter: Secondary | ICD-10-CM | POA: Diagnosis not present

## 2016-05-12 DIAGNOSIS — E785 Hyperlipidemia, unspecified: Secondary | ICD-10-CM | POA: Diagnosis not present

## 2016-05-12 DIAGNOSIS — E119 Type 2 diabetes mellitus without complications: Secondary | ICD-10-CM | POA: Diagnosis not present

## 2016-05-12 DIAGNOSIS — Z951 Presence of aortocoronary bypass graft: Secondary | ICD-10-CM | POA: Diagnosis not present

## 2016-05-12 DIAGNOSIS — I110 Hypertensive heart disease with heart failure: Secondary | ICD-10-CM | POA: Diagnosis not present

## 2016-05-12 DIAGNOSIS — I509 Heart failure, unspecified: Secondary | ICD-10-CM | POA: Diagnosis not present

## 2016-05-12 DIAGNOSIS — I2510 Atherosclerotic heart disease of native coronary artery without angina pectoris: Secondary | ICD-10-CM | POA: Diagnosis not present

## 2016-05-12 DIAGNOSIS — L539 Erythematous condition, unspecified: Secondary | ICD-10-CM | POA: Diagnosis not present

## 2016-05-12 DIAGNOSIS — T85510A Breakdown (mechanical) of bile duct prosthesis, initial encounter: Secondary | ICD-10-CM | POA: Diagnosis not present

## 2016-05-12 DIAGNOSIS — R109 Unspecified abdominal pain: Secondary | ICD-10-CM | POA: Diagnosis not present

## 2016-05-16 ENCOUNTER — Ambulatory Visit (INDEPENDENT_AMBULATORY_CARE_PROVIDER_SITE_OTHER): Payer: PPO | Admitting: Cardiovascular Disease

## 2016-05-16 ENCOUNTER — Encounter: Payer: Self-pay | Admitting: Cardiovascular Disease

## 2016-05-16 VITALS — BP 116/58 | HR 53 | Ht 67.0 in | Wt 221.2 lb

## 2016-05-16 DIAGNOSIS — I5033 Acute on chronic diastolic (congestive) heart failure: Secondary | ICD-10-CM

## 2016-05-16 DIAGNOSIS — I10 Essential (primary) hypertension: Secondary | ICD-10-CM

## 2016-05-16 DIAGNOSIS — I25810 Atherosclerosis of coronary artery bypass graft(s) without angina pectoris: Secondary | ICD-10-CM

## 2016-05-16 DIAGNOSIS — E1151 Type 2 diabetes mellitus with diabetic peripheral angiopathy without gangrene: Secondary | ICD-10-CM

## 2016-05-16 DIAGNOSIS — R600 Localized edema: Secondary | ICD-10-CM

## 2016-05-16 DIAGNOSIS — K810 Acute cholecystitis: Secondary | ICD-10-CM

## 2016-05-16 MED ORDER — POTASSIUM CHLORIDE ER 10 MEQ PO TBCR: 10.0000 meq | EXTENDED_RELEASE_TABLET | Freq: Two times a day (BID) | ORAL | Status: DC

## 2016-05-16 MED ORDER — TORSEMIDE 20 MG PO TABS: 20.0000 mg | ORAL_TABLET | Freq: Every day | ORAL | Status: DC

## 2016-05-16 NOTE — Progress Notes (Signed)
Patient ID: Joseph [name], male   DOB: Feb 15, 1925, 82 y.o.   MRN: [id number] Cardiology Office Note  Date:  05/16/2016   ID:  Perley, Arthurs [birth year], MRN [id number]  PCP:  Wilhemena Durie, MD   Chief Complaint  Patient presents with  . other    Cardiac clearance. Meds reviewed verbally with pt.    HPI:  Joseph [name] is a 82 year old gentleman with a hx of CAD, CABG in 1994 per the wife, PAD with reported stenosis in the past requiring intervention (details unavailable), followed by Dr. Lucky Cowboy, obesity, hyperlipidemia, arthritis of the knees, who previously presented with increasing fatigue, SOB, leg weakness, Prior episodes of lower extremity edema, now presenting for routine followup of his coronary artery disease and history of acute on chronic diastolic CHF. He is a patient of Dr. Rosanna Randy.  Recent hospital admission May 2017 for acute cholecystitis, developing acute respiratory distress with hypoxia and acute renal disease, peak creatinine of 2. Notes indicating HIDA scan positive for acute cholecystitis. s/p cholecystostomy tube placed in through interventional radiology 03/24/16 Diuretic was discontinued in the setting of acute renal failure He does report greater than 20 pound weight loss  In follow-up today, reports that he feels better, has difficulty with his biliary drain, continues to get clogged Been to the emergency room twice in the past several days, family member has learned how to flush the drain Renal function back to normal 11/22/2015, restarted on ACE inhibitor at that time by primary care Family reports he has worsening lower extremity edema, currently not taking his torsemide He reports diabetes numbers are dramatically improved, managed by Dr. Gabriel Carina  Denies any significant chest pain concerning for angina. Reports shortness of breath back at the end of April 2017 has resolved. He feels this was secondary to fluid retention. Currently ambulating  without difficulty breathing  Echocardiogram 03/22/2016 reviewed with him, ejection fraction 50-55% Chest x-ray 03/29/2016 with small right pleural effusion  EKG on today's visit shows normal sinus rhythm rate 53 bpm, rare PVC , intraventricular conduction delay,  left anterior fascicular block  Other past medical history Ultrasound of the legs showing patent right SFA stent, left side CFA and popliteal artery with good flow, waveform deterioration around the ankle on the left  Last Cardiac catheterization was performed at [facility] 08/16/2012 Catheterization showed severe three-vessel coronary artery disease with patent grafts x4. There was 80% disease of a small to moderate sized distal left circumflex. Ejection fraction 45-50%. No significant aortic valve stenosis or mitral valve regurgitation. The findings were discussed with interventional cardiology and medical management was recommended for the distal left circumflex disease.  previous Echocardiogram did not show elevated right-sided pressures and ejection fraction was normal. Previous episode of tachycardia in June 2013 that resolved without intervention  Total cholesterol 143, LDL 82  PMH:   has a past medical history of CAD (coronary artery disease); Diabetes mellitus, type 2 ([place]); Hyperlipidemia; HTN (hypertension); Hypothyroidism; Chronic diastolic CHF (congestive heart failure) ([place]); and Lower extremity edema.  PSH:    Past Surgical History  Procedure Laterality Date  . Prostate biopsy    . Hemorrhoid surgery    . Appendectomy    . Toe surgery      ingrown toe nail  . Coronary artery bypass graft  1994  . Cardiac catheterization  07/2012    [facility]/no stents  . Corneal transplant      Current Outpatient Prescriptions  Medication Sig Dispense Refill  . aspirin EC 81  MG tablet Take 1 tablet (81 mg total) by mouth daily. 90 tablet 3  . atorvastatin (LIPITOR) 40 MG tablet Take 1 tablet (40 mg total) by mouth daily. 90 tablet  3  . blood glucose meter kit and supplies KIT Dispense Accuchek Aviva glucometer with 120 test strips.  Test glucose level 3 times per day with meals and at bedtime.  Dispense based on patient and insurance preference. Use up to four times daily as directed. (FOR ICD-9 250.00, 250.01, ICD 10 E11.9). 1 each 0  . carvedilol (COREG) 3.125 MG tablet Take 1 tablet (3.125 mg total) by mouth 2 (two) times daily with a meal. 60 tablet 12  . doxazosin (CARDURA) 4 MG tablet Take 4 mg by mouth 2 (two) times daily.    Marland Kitchen glucose blood (ONE TOUCH ULTRA TEST) test strip Check sugar 4 times daily DX:E 11.51 100 each 12  . insulin aspart (NOVOLOG) 100 UNIT/ML injection Inject 0-9 Units into the skin 3 (three) times daily with meals. (Patient taking differently: Inject 0-9 Units into the skin 3 (three) times daily with meals. 26 am, 16 lunch, 28 pm) 10 mL 0  . insulin glargine (LANTUS) 100 UNIT/ML injection Inject 0.68 mLs (68 Units total) into the skin at bedtime. (Patient taking differently: Inject 68 Units into the skin at bedtime. 16 am and 65 at bedtime) 10 mL 0  . levothyroxine (SYNTHROID, LEVOTHROID) 50 MCG tablet Take 25 mcg by mouth daily before breakfast.     . lisinopril (PRINIVIL,ZESTRIL) 40 MG tablet Take 40 mg by mouth daily.    . Magnesium 250 MG TABS Take by mouth.    . nitroGLYCERIN (NITROSTAT) 0.4 MG SL tablet Place 1 tablet (0.4 mg total) under the tongue every 5 (five) minutes as needed for chest pain. 25 tablet 3  . ONETOUCH DELICA LANCETS 40N MISC Check sugar 4 times daily. DX E11.51 100 each 12  . vitamin C (ASCORBIC ACID) 500 MG tablet Take 500 mg by mouth daily.    . potassium chloride (K-DUR) 10 MEQ tablet Take 1 tablet (10 mEq total) by mouth 2 (two) times daily. 180 tablet 3  . torsemide (DEMADEX) 20 MG tablet Take 1 tablet (20 mg total) by mouth daily. 90 tablet 4   No current facility-administered medications for this visit.     Allergies:   Review of patient's allergies indicates no  known allergies.   Social History:  The patient  reports that he quit smoking about 35 years ago. His smoking use included Pipe and Cigars. He has never used smokeless tobacco. He reports that he does not drink alcohol or use illicit drugs.   Family History:   family history includes Heart attack in his father; Heart failure in his maternal grandfather, maternal uncle, and mother.    Review of Systems: Review of Systems  Constitutional: Negative.   Respiratory: Negative.   Cardiovascular: Positive for leg swelling.  Gastrointestinal:       Mild abdominal bloating  Musculoskeletal: Negative.   Neurological: Negative.   Psychiatric/Behavioral: Negative.   All other systems reviewed and are negative.    PHYSICAL EXAM: VS:  BP 116/58 mmHg  Pulse 53  Ht 5' 7"  (1.702 m)  Wt 221 lb 4 oz (100.358 kg)  BMI 34.64 kg/m2 , BMI Body mass index is 34.64 kg/(m^2). GEN: Well nourished, well developed, in no acute distress, obese HEENT: normal Neck: no JVD, carotid bruits, or masses Cardiac: RRR; no murmurs, rubs, or gallops, trace pitting edema to  the mid shins  Respiratory:  clear to auscultation bilaterally, normal work of breathing GI: soft, nontender, nondistended, + BS MS: no deformity or atrophy Skin: warm and dry, no rash Neuro:  Strength and sensation are intact Psych: euthymic mood, full affect    Recent Labs: 03/26/2016: Magnesium 2.5* 03/27/2016: Hemoglobin 10.9* 04/21/2016: ALT 15; BUN 19; Creatinine, Ser 1.01; Platelets 174; Potassium 4.5; Sodium 140; TSH 1.920    Lipid Panel Lab Results  Component Value Date   CHOL 148 07/06/2015   HDL 36 07/06/2015   LDLCALC 91 07/06/2015   TRIG 191* 07/06/2015      Wt Readings from Last 3 Encounters:  05/16/16 221 lb 4 oz (100.358 kg)  05/11/16 219 lb (99.338 kg)  04/21/16 221 lb (100.245 kg)      ASSESSMENT AND PLAN:  Atherosclerosis of coronary artery bypass graft of native heart without angina pectoris - Plan: EKG  12-Lead Currently with no symptoms of angina. No further workup at this time. Continue current medication regimen. Previously seen by our office and of May 2017, prior symptoms of shortness of breath resolved. Likely secondary to acute on chronic diastolic CHF.  Currently with no plan for catheterization given no anginal symptoms. Catheterization leading to possible stent placement would dramatically delay laparoscopic cholecystectomy possibly for 6 months or more. This will likely not be prudent given the urgent nature of his surgery  Diastolic CHF, acute on chronic ([place]) - Plan: EKG 12-Lead Recommended he he restart torsemide 20 mg daily given lower extremity edema We'll also restart potassium supplement Wife reports he is not drinking much, recommended if leg edema resolves that he change torsemide to every other day  Diabetes mellitus with peripheral vascular disease ([place]) Family reports diabetes control is dramatically improved Recent 20 pound weight loss in the hospital  HYPERTENSION, BENIGN Blood pressure is well controlled on today's visit. No changes made to the medications. Recently started back on ACE inhibitor  Acute cholecystitis Acceptable risk for upcoming laparoscopic cholecystectomy,  No further testing needed Would recommend minimizing IV fluids given high risk of acute on chronic diastolic CHF  Bilateral leg edema As detailed above, recommended he restart his torsemide daily with potassium supplement  Acute renal insufficiency/ATN In the setting of sepsis May 2017, now renal function back to normal    Total encounter time more than 25 minutes  Greater than 50% was spent in counseling and coordination of care with the patient   Disposition:   F/U  3 months   Orders Placed This Encounter  Procedures  . EKG 12-Lead     Signed, Esmond Plants, M.D., Ph.D. 05/16/2016  [place], [place]

## 2016-05-16 NOTE — Patient Instructions (Addendum)
Medication Instructions:   Please stop hydralazine Monitor blood pressure at home  Please start torsemide 20 mg daily Take with potassium 2 pills a day Ok to restart magnesium  Hold the torsemide if no leg swelling Take two torsemide for severe leg swelling   Follow-Up: It was a pleasure seeing you in the office today. Please call us if you have new issues that need to be addressed before your next appt.  [phone]  Your physician wants you to follow-up in: 3 months.  You will receive a reminder letter in the mail two months in advance. If you don't receive a letter, please call our office to schedule the follow-up appointment.  If you need a refill on your cardiac medications before your next appointment, please call your pharmacy.

## 2016-05-20 DIAGNOSIS — E669 Obesity, unspecified: Secondary | ICD-10-CM | POA: Diagnosis not present

## 2016-05-20 DIAGNOSIS — E785 Hyperlipidemia, unspecified: Secondary | ICD-10-CM | POA: Diagnosis not present

## 2016-05-20 DIAGNOSIS — I493 Ventricular premature depolarization: Secondary | ICD-10-CM | POA: Diagnosis not present

## 2016-05-20 DIAGNOSIS — K660 Peritoneal adhesions (postprocedural) (postinfection): Secondary | ICD-10-CM | POA: Diagnosis not present

## 2016-05-20 DIAGNOSIS — I2510 Atherosclerotic heart disease of native coronary artery without angina pectoris: Secondary | ICD-10-CM | POA: Diagnosis not present

## 2016-05-20 DIAGNOSIS — K811 Chronic cholecystitis: Secondary | ICD-10-CM | POA: Diagnosis not present

## 2016-05-20 DIAGNOSIS — Z6835 Body mass index (BMI) 35.0-35.9, adult: Secondary | ICD-10-CM | POA: Diagnosis not present

## 2016-05-20 DIAGNOSIS — K810 Acute cholecystitis: Secondary | ICD-10-CM | POA: Diagnosis not present

## 2016-05-20 DIAGNOSIS — K8000 Calculus of gallbladder with acute cholecystitis without obstruction: Secondary | ICD-10-CM | POA: Diagnosis not present

## 2016-05-20 DIAGNOSIS — Z9049 Acquired absence of other specified parts of digestive tract: Secondary | ICD-10-CM | POA: Diagnosis not present

## 2016-05-20 DIAGNOSIS — K219 Gastro-esophageal reflux disease without esophagitis: Secondary | ICD-10-CM | POA: Diagnosis not present

## 2016-05-20 DIAGNOSIS — Z79899 Other long term (current) drug therapy: Secondary | ICD-10-CM | POA: Diagnosis not present

## 2016-05-20 DIAGNOSIS — I110 Hypertensive heart disease with heart failure: Secondary | ICD-10-CM | POA: Diagnosis not present

## 2016-05-20 DIAGNOSIS — Z7982 Long term (current) use of aspirin: Secondary | ICD-10-CM | POA: Diagnosis not present

## 2016-05-20 DIAGNOSIS — E039 Hypothyroidism, unspecified: Secondary | ICD-10-CM | POA: Diagnosis not present

## 2016-05-20 DIAGNOSIS — Z87891 Personal history of nicotine dependence: Secondary | ICD-10-CM | POA: Diagnosis not present

## 2016-05-20 DIAGNOSIS — D649 Anemia, unspecified: Secondary | ICD-10-CM | POA: Diagnosis not present

## 2016-05-20 DIAGNOSIS — Z794 Long term (current) use of insulin: Secondary | ICD-10-CM | POA: Diagnosis not present

## 2016-05-20 DIAGNOSIS — Z9889 Other specified postprocedural states: Secondary | ICD-10-CM | POA: Diagnosis not present

## 2016-05-20 DIAGNOSIS — Z951 Presence of aortocoronary bypass graft: Secondary | ICD-10-CM | POA: Diagnosis not present

## 2016-05-20 DIAGNOSIS — I5032 Chronic diastolic (congestive) heart failure: Secondary | ICD-10-CM | POA: Diagnosis not present

## 2016-05-20 DIAGNOSIS — K8010 Calculus of gallbladder with chronic cholecystitis without obstruction: Secondary | ICD-10-CM | POA: Diagnosis not present

## 2016-05-20 DIAGNOSIS — R339 Retention of urine, unspecified: Secondary | ICD-10-CM | POA: Diagnosis not present

## 2016-05-20 DIAGNOSIS — E1151 Type 2 diabetes mellitus with diabetic peripheral angiopathy without gangrene: Secondary | ICD-10-CM | POA: Diagnosis not present

## 2016-05-21 DIAGNOSIS — I10 Essential (primary) hypertension: Secondary | ICD-10-CM | POA: Diagnosis not present

## 2016-05-21 DIAGNOSIS — E119 Type 2 diabetes mellitus without complications: Secondary | ICD-10-CM | POA: Diagnosis not present

## 2016-05-21 DIAGNOSIS — I5032 Chronic diastolic (congestive) heart failure: Secondary | ICD-10-CM | POA: Diagnosis not present

## 2016-05-21 DIAGNOSIS — I2510 Atherosclerotic heart disease of native coronary artery without angina pectoris: Secondary | ICD-10-CM | POA: Diagnosis not present

## 2016-05-22 DIAGNOSIS — I2510 Atherosclerotic heart disease of native coronary artery without angina pectoris: Secondary | ICD-10-CM | POA: Diagnosis not present

## 2016-05-22 DIAGNOSIS — I10 Essential (primary) hypertension: Secondary | ICD-10-CM | POA: Diagnosis not present

## 2016-05-22 DIAGNOSIS — E119 Type 2 diabetes mellitus without complications: Secondary | ICD-10-CM | POA: Diagnosis not present

## 2016-05-22 DIAGNOSIS — I5032 Chronic diastolic (congestive) heart failure: Secondary | ICD-10-CM | POA: Diagnosis not present

## 2016-05-23 ENCOUNTER — Ambulatory Visit: Payer: PPO | Admitting: Family Medicine

## 2016-05-23 ENCOUNTER — Telehealth: Payer: Self-pay | Admitting: Cardiovascular Disease

## 2016-05-23 NOTE — Telephone Encounter (Signed)
Daughter Olegario Shearer calling in regards of her father's surgery. He did fine with his gallbladder surgery but that it was a difficult procedure.

## 2016-06-01 ENCOUNTER — Other Ambulatory Visit: Payer: Self-pay | Admitting: Emergency Medicine

## 2016-06-01 ENCOUNTER — Telehealth: Payer: Self-pay | Admitting: Family Medicine

## 2016-06-01 DIAGNOSIS — Z794 Long term (current) use of insulin: Secondary | ICD-10-CM | POA: Diagnosis not present

## 2016-06-01 DIAGNOSIS — E119 Type 2 diabetes mellitus without complications: Secondary | ICD-10-CM | POA: Diagnosis not present

## 2016-06-01 MED ORDER — LEVOTHYROXINE SODIUM 50 MCG PO TABS: 50.0000 ug | ORAL_TABLET | Freq: Every day | ORAL | Status: DC

## 2016-06-01 NOTE — Telephone Encounter (Signed)
error 

## 2016-06-10 ENCOUNTER — Telehealth: Payer: Self-pay | Admitting: Cardiovascular Disease

## 2016-06-10 NOTE — Telephone Encounter (Signed)
Spoke w/ pt.  He reports that dizziness is new sx, been going on for little over a week.  He recently has cholecystectomy.   Reports that he is dizzy after changing positions and is concerned that his HR is so low; usually runs consistently in the 60s. Advised pt to change positions slowly and to monitor his BP while sitting and then standing up.  He reports that he can drive w/ no problem, but becomes dizzy after getting out of the car.  Pt would like to stop one of his meds, if possible due to sx. Advised him that I will make Dr. Rockey Situ aware of his concerns. He is appreciative and will record readings until I call him back.

## 2016-06-10 NOTE — Telephone Encounter (Signed)
Spoke w/ pt's wife.  Advised her of Dr. Donivan Scull recommendation. She is agreeable and will call back if sx do not resolve.

## 2016-06-10 NOTE — Telephone Encounter (Signed)
Pt is on 3 medications and pt daughter thinks that he may be on to many. Would like some advise on that.. They won't let patient drive for he gets dizzy easy. Usually happens when he gets out of a car not during the ride but as he tries to get out.   06/03/16:  HR 61  139/55  06/04/16  HR49  159/63 06/05/16: HR 47  146/59 06/06/16 HR 52    162/57 06/07/16: HR 62  156/49 06/08/16: HR 58  140/61  06/09/16: HR 48 152/62

## 2016-06-10 NOTE — Telephone Encounter (Signed)
Would stop the carvedilol for now This can slow heart rate Give it 5 days to wash out Call back if he continues to have symptoms

## 2016-06-14 ENCOUNTER — Telehealth: Payer: Self-pay | Admitting: Cardiovascular Disease

## 2016-06-14 DIAGNOSIS — B354 Tinea corporis: Secondary | ICD-10-CM | POA: Diagnosis not present

## 2016-06-14 DIAGNOSIS — L400 Psoriasis vulgaris: Secondary | ICD-10-CM | POA: Diagnosis not present

## 2016-06-14 NOTE — Telephone Encounter (Signed)
Pt wife calling stating he is having dizzy spells Pt is not drinking water like he is suppose to She would like [place] to just tell him to drink his water.  Medication is causing some issues.  Not sure which one could be causing these spells  Please advise.

## 2016-06-14 NOTE — Telephone Encounter (Signed)
Spoke w/ pt's wife.  She states that pt is still having dizzy spells, yesterday was a bad day.  Pt's carvedilol was d/c'd on 7/21 2/2 low HR.  Date BP   HR SpO2 7/23 152/57   53 94%  152/57  56 94%  157/56  53 7/24 161/60   56 92% 7/25 160/64  60 92%  Pt's wife requests that Dr. Rockey Situ review pt's chart and see if any other meds can be adjusted.   Pt has another doctor appt this afternoon and his wife states that she would like a call back before 3:30.   Advised her that Dr. Rockey Situ is in clinic and I will most likely call her after we finish seeing pts.

## 2016-06-15 NOTE — Telephone Encounter (Signed)
Pt's daughter, Jocelyn Lamer, (on [place]) presented to the office.  I was advised by the front office staff that she was quite upset and requesting a "face-to-face meeting w/ Dr. Rockey Situ" before Friday.  Spoke w/ her and advised that Dr. Rockey Situ reviewed pt's phone note from yesterday and advised that pt's BP #s are high and would most likely not be the cause of pt's dizziness. She states that pt and his wife feel that sx are being caused by one of his meds and that we need to figure out which one it is.  Pt decided to "experiment", only took his synthroid this am, held all other meds and feels "better than he has in ages". Advised her that if pt is going to hold his BP meds, he needs to closely monitor his BP. She does not have any readings to report in addition to those provided yesterday.  She reports that pt becomes dizzy on standing (has not checked orthostatic BPs) and that the room spins.  Pt has not spoken w/ PCP regarding this, unsure if pt has vertigo.  Jocelyn Lamer lives in [place], is only visiting the area for 3 days and is trying to get pt seen before she leaves this weekend.  Advised her that pt is on our waiting list in the event of a cancellation, but recommended that she contact pt's PCP, as they can test for a broader range of possible causes of his sx.  She is appreciative and will proceed to Dr. Alben Spittle office now to see if he will speak w/ her (pt is not present - she is trying to handle is issues w/o taking him out of his home in the heat).

## 2016-06-16 ENCOUNTER — Ambulatory Visit (INDEPENDENT_AMBULATORY_CARE_PROVIDER_SITE_OTHER): Payer: PPO | Admitting: Family Medicine

## 2016-06-16 VITALS — BP 142/54 | HR 58 | Temp 98.5°F | Resp 14 | Wt 213.0 lb

## 2016-06-16 DIAGNOSIS — E1122 Type 2 diabetes mellitus with diabetic chronic kidney disease: Secondary | ICD-10-CM

## 2016-06-16 DIAGNOSIS — Z794 Long term (current) use of insulin: Secondary | ICD-10-CM

## 2016-06-16 DIAGNOSIS — I10 Essential (primary) hypertension: Secondary | ICD-10-CM

## 2016-06-16 DIAGNOSIS — M2550 Pain in unspecified joint: Secondary | ICD-10-CM | POA: Diagnosis not present

## 2016-06-16 DIAGNOSIS — Z9049 Acquired absence of other specified parts of digestive tract: Secondary | ICD-10-CM | POA: Diagnosis not present

## 2016-06-16 DIAGNOSIS — R42 Dizziness and giddiness: Secondary | ICD-10-CM | POA: Diagnosis not present

## 2016-06-16 DIAGNOSIS — E669 Obesity, unspecified: Secondary | ICD-10-CM | POA: Diagnosis not present

## 2016-06-16 DIAGNOSIS — R001 Bradycardia, unspecified: Secondary | ICD-10-CM

## 2016-06-16 NOTE — Progress Notes (Signed)
Subjective:  HPI  Patient states he has had issues with dizziness since July 12th. Then he woke up 1 day he noticed and did a few things and then took LIsinopril and Cardura and became very dizzy after that. Cardiologist stopped Carvedilol but patient did not feel any better so the past 3 days he did not take Cardura and Lisinopril and feels great now. He was concerned if he is dehydrated but has been drinking a lot water during the day.  He has been taking his b/p since not taking Lisinopril 148/64 at 8:15 am, at 2:15 158/59. Before making those changed b/p was 150s-160s/50s and pulse has been in 79s and cardiologist thought maybe his symptoms are coming from heart rate and to follow up with his PCP. He does have a complaint of cut chronic weakness in his lower back and upper legs when he walks. This is been going on for years. Prior to Admission medications   Medication Sig Start Date End Date Taking? Authorizing Provider  aspirin EC 81 MG tablet Take 1 tablet (81 mg total) by mouth daily. 03/17/16   Areta Haber Dunn, PA-C  atorvastatin (LIPITOR) 40 MG tablet Take 1 tablet (40 mg total) by mouth daily. 10/26/15   Minna Merritts, MD  blood glucose meter kit and supplies KIT Dispense Accuchek Aviva glucometer with 120 test strips.  Test glucose level 3 times per day with meals and at bedtime.  Dispense based on patient and insurance preference. Use up to four times daily as directed. (FOR ICD-9 250.00, 250.01, ICD 10 E11.9). 03/28/16   Lance Coon, MD  carvedilol (COREG) 3.125 MG tablet Take 1 tablet (3.125 mg total) by mouth 2 (two) times daily with a meal. Patient not taking: Reported on 06/10/2016 05/10/16   [name]., MD  doxazosin (CARDURA) 4 MG tablet Take 4 mg by mouth 2 (two) times daily.    Historical Provider, MD  glucose blood (ONE TOUCH ULTRA TEST) test strip Check sugar 4 times daily DX:E 11.51 04/15/16   Margarita Rana, MD  insulin aspart (NOVOLOG) 100 UNIT/ML injection Inject  0-9 Units into the skin 3 (three) times daily with meals. Patient taking differently: Inject 0-9 Units into the skin 3 (three) times daily with meals. 26 am, 16 lunch, 28 pm 03/28/16   Demetrios Loll, MD  insulin glargine (LANTUS) 100 UNIT/ML injection Inject 0.68 mLs (68 Units total) into the skin at bedtime. Patient taking differently: Inject 68 Units into the skin at bedtime. 16 am and 65 at bedtime 03/28/16   Demetrios Loll, MD  levothyroxine (SYNTHROID, LEVOTHROID) 50 MCG tablet Take 1 tablet (50 mcg total) by mouth daily before breakfast. 06/01/16   [name]., MD  lisinopril (PRINIVIL,ZESTRIL) 40 MG tablet Take 40 mg by mouth daily.    Historical Provider, MD  Magnesium 250 MG TABS Take by mouth.    Historical Provider, MD  nitroGLYCERIN (NITROSTAT) 0.4 MG SL tablet Place 1 tablet (0.4 mg total) under the tongue every 5 (five) minutes as needed for chest pain. 04/29/14   Rogelia Mire, NP  [facility] DELICA LANCETS 11B MISC Check sugar 4 times daily. DX E11.51 04/15/16   Margarita Rana, MD  potassium chloride (K-DUR) 10 MEQ tablet Take 1 tablet (10 mEq total) by mouth 2 (two) times daily. 05/16/16   Minna Merritts, MD  torsemide (DEMADEX) 20 MG tablet Take 1 tablet (20 mg total) by mouth daily. 05/16/16   Minna Merritts, MD  vitamin C (ASCORBIC ACID) 500 MG tablet Take 500 mg by mouth daily.    Historical Provider, MD    Patient Active Problem List   Diagnosis Date Noted  . Acute cholecystitis   . Abdominal pain   . Calculus of gallbladder with acute cholecystitis without obstruction   . Diverticulitis of large intestine without perforation or abscess without bleeding   . Pain of upper abdomen   . SOB (shortness of breath)   . S/P CABG (coronary artery bypass graft)   . Coronary artery disease involving coronary bypass graft of native heart with unspecified angina pectoris   . Morbid obesity due to excess calories ([place])   . Acute respiratory failure with hypoxia ([place]) 03/21/2016  .  Back pain, chronic 07/28/2015  . Benign fibroma of prostate 07/28/2015  . Acid reflux 07/28/2015  . Adult hypothyroidism 07/28/2015  . Eunuchoidism 07/28/2015  . Neuropathy ([place]) 07/28/2015  . Arthritis, degenerative 07/28/2015  . Psoriasis 07/28/2015  . Obesity 09/09/2014  . Bilateral leg weakness 09/09/2014  . Diastolic CHF, acute on chronic ([place]) 05/13/2014  . Bilateral leg edema 04/15/2014  . Bradycardia 08/13/2013  . Edema 06/21/2012  . Fatigue 09/15/2011  . Diabetes mellitus with peripheral vascular disease ([place]) 09/15/2011  . DYSPNEA 06/09/2009  . Hyperlipidemia 06/07/2009  . HYPERTENSION, BENIGN 06/07/2009  . CAD, ARTERY BYPASS GRAFT 06/07/2009    Past Medical History:  Diagnosis Date  . CAD (coronary artery disease)    a. s/p CABG;  b. 07/2012 Cath: 3VD including 80 dLCX (med Rx), 4/4 patent grafts.  . Chronic diastolic CHF (congestive heart failure) ([place])    a. 07/2012 Echo: EF 55-60%, no rwma, Gr 1 DD, mild MR;  04/2014 Echo: EF 55-60%, no rwma, mild MR, mildly dil LA.  . Diabetes mellitus, type 2 ([place])   . HTN (hypertension)   . Hyperlipidemia   . Hypothyroidism   . Lower extremity edema    a. 03/2014 amlodipine d/c'd.    Social History   Social History  . Marital status: Married    Spouse name: N/A  . Number of children: N/A  . Years of education: N/A   Occupational History  . Not on file.   Social History Main Topics  . Smoking status: Former Smoker    Packs/day: 0.00    Years: 6.00    Types: Pipe, Cigars    Quit date: 12/23/1980  . Smokeless tobacco: Never Used  . Alcohol use No  . Drug use: No  . Sexual activity: No   Other Topics Concern  . Not on file   Social History Narrative   Retired.     No Known Allergies  Review of Systems  Constitutional: Negative.   HENT: Negative.   Eyes: Negative.   Respiratory: Negative.   Cardiovascular: Negative.        Bradycardia  Gastrointestinal: Negative.   Musculoskeletal: Positive for joint  pain and myalgias.  Skin: Negative.   Neurological: Negative.   Endo/Heme/Allergies: Negative.   Psychiatric/Behavioral: Negative.     Immunization History  Administered Date(s) Administered  . Influenza, High Dose Seasonal PF 09/01/2015   Objective:  BP (!) 142/54   Pulse (!) 58   Temp 98.5 F (36.9 C)   Resp 14   Wt 213 lb (96.6 kg)   BMI 33.36 kg/m   Physical Exam  Constitutional: He is oriented to person, place, and time and well-developed, well-nourished, and in no distress. No distress.  HENT:  Head: Normocephalic and atraumatic.  Right Ear: External ear normal.  Left Ear: External ear normal.  Nose: Nose normal.  Eyes: Conjunctivae are normal. Pupils are equal, round, and reactive to light.  Neck: Normal range of motion. Neck supple.  Cardiovascular: Normal rate, regular rhythm, normal heart sounds and intact distal pulses.   No murmur heard. Pulmonary/Chest: Effort normal and breath sounds normal. No respiratory distress. He has no wheezes.  Abdominal: Soft.  Musculoskeletal: He exhibits no edema or tenderness.  Neurological: He is alert and oriented to person, place, and time.  Skin: Skin is warm and dry.  Psychiatric: Mood, memory, affect and judgment normal.    Lab Results  Component Value Date   WBC 6.5 04/21/2016   HGB 10.9 (L) 03/27/2016   HCT 35.7 (L) 04/21/2016   PLT 174 04/21/2016   GLUCOSE 121 (H) 04/21/2016   CHOL 148 07/06/2015   TRIG 191 (A) 07/06/2015   HDL 36 07/06/2015   LDLCALC 91 07/06/2015   TSH 1.920 04/21/2016   INR 1.37 03/24/2016   HGBA1C 6.4 03/29/2016    CMP     Component Value Date/Time   NA 140 04/21/2016 1521   K 4.5 04/21/2016 1521   CL 101 04/21/2016 1521   CO2 24 04/21/2016 1521   GLUCOSE 121 (H) 04/21/2016 1521   GLUCOSE 80 03/27/2016 0454   BUN 19 04/21/2016 1521   CREATININE 1.01 04/21/2016 1521   CALCIUM 9.1 04/21/2016 1521   PROT 6.6 04/21/2016 1521   ALBUMIN 3.5 04/21/2016 1521   AST 16 04/21/2016 1521     ALT 15 04/21/2016 1521   ALKPHOS 62 04/21/2016 1521   BILITOT 0.3 04/21/2016 1521   GFRNONAA 66 04/21/2016 1521   GFRAA 76 04/21/2016 1521    Assessment and Plan :  1. Essential hypertension Stable. Can stay off Carvedilol and Lisinopril and we will follow. More than 50% of visit is spent in counseling regarding these multiple issues. 2. Type 2 diabetes mellitus with chronic kidney disease, with long-term current use of insulin, unspecified CKD stage ([place]) Per Dr. Gabriel Carina 3. Obesity Has lost weight, encouraged patient to continue keeping weight off.  4. Status post cholecystectomy  5. Dizziness EKG stable/unchanged. Advised patient not to take Torsemide unless edema comes back. - EKG 12-Lead Return to clinic 1 month. 6. Bradycardia May need to see Dr Rockey Situ to re address this. Follow for now. - EKG 12-Lead 7. Chronic low back weakness This could represent deconditioning versus degenerative disc disease versus possible polymyalgia rheumatica. Obtain sedimentation rate today. No x-rays at this time is he is not describing pain. 8.CAD Patient was seen and examined by Dr. Eulas Post and note was scribed by Theressa Millard, RMA.  Miguel Aschoff MD [facility] 06/16/2016 2:22 PM

## 2016-06-16 NOTE — Patient Instructions (Signed)
Stay off Lisinopril, Cardura, Torsemide (unless needed) and Carvedilol.

## 2016-06-17 LAB — CBC WITH DIFFERENTIAL/PLATELET
BASOS ABS: 0.1 10*3/uL (ref 0.0–0.2)
Basos: 1 %
EOS (ABSOLUTE): 0.6 10*3/uL — ABNORMAL HIGH (ref 0.0–0.4)
Eos: 8 %
HEMOGLOBIN: 10.7 g/dL — AB (ref 12.6–17.7)
Hematocrit: 33.2 % — ABNORMAL LOW (ref 37.5–51.0)
IMMATURE GRANS (ABS): 0 10*3/uL (ref 0.0–0.1)
IMMATURE GRANULOCYTES: 0 %
LYMPHS: 48 %
Lymphocytes Absolute: 3.3 10*3/uL — ABNORMAL HIGH (ref 0.7–3.1)
MCH: 29.1 pg (ref 26.6–33.0)
MCHC: 32.2 g/dL (ref 31.5–35.7)
MCV: 90 fL (ref 79–97)
Monocytes Absolute: 0.5 10*3/uL (ref 0.1–0.9)
Monocytes: 8 %
Neutrophils Absolute: 2.4 10*3/uL (ref 1.4–7.0)
Neutrophils: 35 %
Platelets: 211 10*3/uL (ref 150–379)
RBC: 3.68 x10E6/uL — AB (ref 4.14–5.80)
RDW: 14.9 % (ref 12.3–15.4)
WBC: 6.8 10*3/uL (ref 3.4–10.8)

## 2016-06-17 LAB — COMPREHENSIVE METABOLIC PANEL
ALBUMIN: 3.4 g/dL — AB (ref 3.5–4.7)
ALT: 11 IU/L (ref 0–44)
AST: 19 IU/L (ref 0–40)
Albumin/Globulin Ratio: 1.1 — ABNORMAL LOW (ref 1.2–2.2)
Alkaline Phosphatase: 62 IU/L (ref 39–117)
BUN / CREAT RATIO: 16 (ref 10–24)
BUN: 15 mg/dL (ref 8–27)
Bilirubin Total: 0.3 mg/dL (ref 0.0–1.2)
CALCIUM: 8.9 mg/dL (ref 8.6–10.2)
CO2: 23 mmol/L (ref 18–29)
CREATININE: 0.93 mg/dL (ref 0.76–1.27)
Chloride: 102 mmol/L (ref 96–106)
GFR calc non Af Amer: 73 mL/min/{1.73_m2} (ref >59)
GFR, EST AFRICAN AMERICAN: 84 mL/min/{1.73_m2} (ref >59)
GLUCOSE: 113 mg/dL — AB (ref 65–99)
Globulin, Total: 3.1 g/dL (ref 1.5–4.5)
Potassium: 4.6 mmol/L (ref 3.5–5.2)
Sodium: 140 mmol/L (ref 134–144)
TOTAL PROTEIN: 6.5 g/dL (ref 6.0–8.5)

## 2016-06-17 LAB — SEDIMENTATION RATE: Sed Rate: 41 mm/hr — ABNORMAL HIGH (ref 0–30)

## 2016-06-17 LAB — TSH: TSH: 1.63 u[IU]/mL (ref 0.450–4.500)

## 2016-06-20 ENCOUNTER — Telehealth: Payer: Self-pay

## 2016-06-20 NOTE — Telephone Encounter (Signed)
Advised patient as below. Patient was scheduled for an appt this week to discuss.   Notes Recorded by [name]., MD on 06/17/2016 at 2:04 PM EDT Labs okay except for mildly elevated sedimentation rate. Appt next Tuesday or Wednesday to discuss what this might mean. I do not think that this is a bad issue but the patient might have something called polymyalgia rheumatica and he might benefit from treatment but there are side effects to the treatment. We need to discuss with patient if he wishes to pursue this.

## 2016-06-22 ENCOUNTER — Ambulatory Visit (INDEPENDENT_AMBULATORY_CARE_PROVIDER_SITE_OTHER): Payer: PPO | Admitting: Family Medicine

## 2016-06-22 VITALS — BP 148/60 | HR 54 | Temp 98.4°F | Resp 12 | Wt 212.0 lb

## 2016-06-22 DIAGNOSIS — R700 Elevated erythrocyte sedimentation rate: Secondary | ICD-10-CM | POA: Diagnosis not present

## 2016-06-22 DIAGNOSIS — E1122 Type 2 diabetes mellitus with diabetic chronic kidney disease: Secondary | ICD-10-CM | POA: Diagnosis not present

## 2016-06-22 DIAGNOSIS — M2550 Pain in unspecified joint: Secondary | ICD-10-CM | POA: Diagnosis not present

## 2016-06-22 DIAGNOSIS — Z794 Long term (current) use of insulin: Secondary | ICD-10-CM | POA: Diagnosis not present

## 2016-06-22 DIAGNOSIS — M545 Low back pain: Secondary | ICD-10-CM | POA: Diagnosis not present

## 2016-06-22 NOTE — Progress Notes (Signed)
Subjective:  HPI  Patient is here to follow up on Sed Rate level which was abnormal and discuss further plan. Overall he feels about the same, maybe a little better. Chronic back weakness continues to be the same Prior to Admission medications   Medication Sig Start Date End Date Taking? Authorizing Provider  aspirin EC 81 MG tablet Take 1 tablet (81 mg total) by mouth daily. 03/17/16   Areta Haber Dunn, PA-C  atorvastatin (LIPITOR) 40 MG tablet Take 1 tablet (40 mg total) by mouth daily. 10/26/15   Minna Merritts, MD  blood glucose meter kit and supplies KIT Dispense Accuchek Aviva glucometer with 120 test strips.  Test glucose level 3 times per day with meals and at bedtime.  Dispense based on patient and insurance preference. Use up to four times daily as directed. (FOR ICD-9 250.00, 250.01, ICD 10 E11.9). 03/28/16   Lance Coon, MD  glucose blood (ONE TOUCH ULTRA TEST) test strip Check sugar 4 times daily DX:E 11.51 04/15/16   Margarita Rana, MD  insulin aspart (NOVOLOG) 100 UNIT/ML injection Inject 0-9 Units into the skin 3 (three) times daily with meals. Patient taking differently: Inject 0-9 Units into the skin 3 (three) times daily with meals. 26 am, 16 lunch, 28 pm 03/28/16   Demetrios Loll, MD  insulin glargine (LANTUS) 100 UNIT/ML injection Inject 0.68 mLs (68 Units total) into the skin at bedtime. Patient taking differently: Inject 68 Units into the skin at bedtime. 16 am and 65 at bedtime 03/28/16   Demetrios Loll, MD  levothyroxine (SYNTHROID, LEVOTHROID) 50 MCG tablet Take 1 tablet (50 mcg total) by mouth daily before breakfast. 06/01/16   [name]., MD  Magnesium 250 MG TABS Take by mouth.    Historical Provider, MD  nitroGLYCERIN (NITROSTAT) 0.4 MG SL tablet Place 1 tablet (0.4 mg total) under the tongue every 5 (five) minutes as needed for chest pain. 04/29/14   Rogelia Mire, NP  [facility] DELICA LANCETS 76H MISC Check sugar 4 times daily. DX E11.51 04/15/16   Margarita Rana, MD    potassium chloride (K-DUR) 10 MEQ tablet Take 1 tablet (10 mEq total) by mouth 2 (two) times daily. 05/16/16   Minna Merritts, MD  vitamin C (ASCORBIC ACID) 500 MG tablet Take 500 mg by mouth daily.    Historical Provider, MD    Patient Active Problem List   Diagnosis Date Noted  . Acute cholecystitis   . Abdominal pain   . Calculus of gallbladder with acute cholecystitis without obstruction   . Diverticulitis of large intestine without perforation or abscess without bleeding   . Pain of upper abdomen   . SOB (shortness of breath)   . S/P CABG (coronary artery bypass graft)   . Coronary artery disease involving coronary bypass graft of native heart with unspecified angina pectoris   . Morbid obesity due to excess calories ([place])   . Acute respiratory failure with hypoxia ([place]) 03/21/2016  . Back pain, chronic 07/28/2015  . Benign fibroma of prostate 07/28/2015  . Acid reflux 07/28/2015  . Adult hypothyroidism 07/28/2015  . Eunuchoidism 07/28/2015  . Neuropathy ([place]) 07/28/2015  . Arthritis, degenerative 07/28/2015  . Psoriasis 07/28/2015  . Obesity 09/09/2014  . Bilateral leg weakness 09/09/2014  . Diastolic CHF, acute on chronic ([place]) 05/13/2014  . Bilateral leg edema 04/15/2014  . Bradycardia 08/13/2013  . Edema 06/21/2012  . Fatigue 09/15/2011  . Diabetes mellitus with peripheral vascular disease ([place]) 09/15/2011  .  DYSPNEA 06/09/2009  . Hyperlipidemia 06/07/2009  . HYPERTENSION, BENIGN 06/07/2009  . CAD, ARTERY BYPASS GRAFT 06/07/2009    Past Medical History:  Diagnosis Date  . CAD (coronary artery disease)    a. s/p CABG;  b. 07/2012 Cath: 3VD including 80 dLCX (med Rx), 4/4 patent grafts.  . Chronic diastolic CHF (congestive heart failure) ([place])    a. 07/2012 Echo: EF 55-60%, no rwma, Gr 1 DD, mild MR;  04/2014 Echo: EF 55-60%, no rwma, mild MR, mildly dil LA.  . Diabetes mellitus, type 2 ([place])   . HTN (hypertension)   . Hyperlipidemia   . Hypothyroidism   . Lower  extremity edema    a. 03/2014 amlodipine d/c'd.    Social History   Social History  . Marital status: Married    Spouse name: N/A  . Number of children: N/A  . Years of education: N/A   Occupational History  . Not on file.   Social History Main Topics  . Smoking status: Former Smoker    Packs/day: 0.00    Years: 6.00    Types: Pipe, Cigars    Quit date: 12/23/1980  . Smokeless tobacco: Never Used  . Alcohol use No  . Drug use: No  . Sexual activity: No   Other Topics Concern  . Not on file   Social History Narrative   Retired.     No Known Allergies  Review of Systems  Constitutional: Negative.   Cardiovascular: Negative.   Musculoskeletal: Positive for joint pain (hip pain).    Immunization History  Administered Date(s) Administered  . Influenza, High Dose Seasonal PF 09/01/2015   Objective:  BP (!) 148/60   Pulse (!) 54   Temp 98.4 F (36.9 C)   Resp 12   Wt 212 lb (96.2 kg)   BMI 33.20 kg/m   Physical Exam  Constitutional: He is oriented to person, place, and time and well-developed, well-nourished, and in no distress.  HENT:  Head: Normocephalic and atraumatic.  Eyes: Conjunctivae are normal. Pupils are equal, round, and reactive to light.  Neck: Normal range of motion. Neck supple.  Cardiovascular: Normal rate, regular rhythm, normal heart sounds and intact distal pulses.   No murmur heard. Pulmonary/Chest: Effort normal and breath sounds normal. No respiratory distress. He has no wheezes.  Musculoskeletal: He exhibits no edema or tenderness.  Neurological: He is alert and oriented to person, place, and time.  Psychiatric: Mood, memory, affect and judgment normal.    Lab Results  Component Value Date   WBC 6.8 06/16/2016   HGB 10.9 (L) 03/27/2016   HCT 33.2 (L) 06/16/2016   PLT 211 06/16/2016   GLUCOSE 113 (H) 06/16/2016   CHOL 148 07/06/2015   TRIG 191 (A) 07/06/2015   HDL 36 07/06/2015   LDLCALC 91 07/06/2015   TSH 1.630 06/16/2016     INR 1.37 03/24/2016   HGBA1C 6.4 03/29/2016    CMP     Component Value Date/Time   NA 140 06/16/2016 1514   K 4.6 06/16/2016 1514   CL 102 06/16/2016 1514   CO2 23 06/16/2016 1514   GLUCOSE 113 (H) 06/16/2016 1514   GLUCOSE 80 03/27/2016 0454   BUN 15 06/16/2016 1514   CREATININE 0.93 06/16/2016 1514   CALCIUM 8.9 06/16/2016 1514   PROT 6.5 06/16/2016 1514   ALBUMIN 3.4 (L) 06/16/2016 1514   AST 19 06/16/2016 1514   ALT 11 06/16/2016 1514   ALKPHOS 62 06/16/2016 1514   BILITOT 0.3  06/16/2016 1514   GFRNONAA 73 06/16/2016 1514   GFRAA 84 06/16/2016 1514    Assessment and Plan :  1. Elevated sed rate Possible polymyalgia rheumatica. Discussed options with patient including referral to specialist or taking Prednisone or following for now with no changes. Patient does not want to take Prednisone.  Will refer to orthopedic for the back pain.  2. Type 2 diabetes mellitus with chronic kidney disease, with long-term current use of insulin, unspecified CKD stage ([facility])  3. Joint pain  4. Bilateral low back pain, with sciatica presence unspecified Chronic, could be a nerve issue or auto immune issue with sed rate level. - Ambulatory referral to Orthopedic Surgery/rheumatology 5. CAD 6. Morbid obesity 7. Status post cholecystectomy Patient recovering nicely. Patient was seen and examined by Dr. Eulas Post and note was scribed by Theressa Millard, RMA.    Miguel Aschoff MD [facility] 06/22/2016 3:02 PM

## 2016-06-23 DIAGNOSIS — Z794 Long term (current) use of insulin: Secondary | ICD-10-CM | POA: Diagnosis not present

## 2016-06-23 DIAGNOSIS — B351 Tinea unguium: Secondary | ICD-10-CM | POA: Diagnosis not present

## 2016-06-23 DIAGNOSIS — M79671 Pain in right foot: Secondary | ICD-10-CM | POA: Diagnosis not present

## 2016-06-23 DIAGNOSIS — M79672 Pain in left foot: Secondary | ICD-10-CM | POA: Diagnosis not present

## 2016-06-23 DIAGNOSIS — E119 Type 2 diabetes mellitus without complications: Secondary | ICD-10-CM | POA: Diagnosis not present

## 2016-06-27 NOTE — Telephone Encounter (Signed)
error 

## 2016-06-28 ENCOUNTER — Telehealth: Payer: Self-pay | Admitting: Family Medicine

## 2016-06-28 MED ORDER — AMLODIPINE BESYLATE 2.5 MG PO TABS: 2.5000 mg | ORAL_TABLET | Freq: Every day | ORAL | 5 refills | Status: DC

## 2016-06-28 NOTE — Telephone Encounter (Signed)
Unlikely but we can Try a baby dose of amlodipine at 2.5 mg daily

## 2016-06-28 NOTE — Telephone Encounter (Signed)
Advised patient. Sent in amlodipine 2.5mg  into the pharmacy.

## 2016-06-28 NOTE — Telephone Encounter (Signed)
Please review. Thanks!  

## 2016-06-28 NOTE — Telephone Encounter (Signed)
Pt states that his blood pressure has been running in the 160's sometimes up to 170.He wants to know if there is a medication he can take for his blood pressure that will not make him dizzy

## 2016-07-04 ENCOUNTER — Ambulatory Visit: Payer: PPO | Admitting: Internal Medicine

## 2016-07-18 ENCOUNTER — Ambulatory Visit: Payer: PPO | Admitting: Family Medicine

## 2016-07-19 DIAGNOSIS — D1801 Hemangioma of skin and subcutaneous tissue: Secondary | ICD-10-CM | POA: Diagnosis not present

## 2016-07-19 DIAGNOSIS — L400 Psoriasis vulgaris: Secondary | ICD-10-CM | POA: Diagnosis not present

## 2016-07-21 DIAGNOSIS — M545 Low back pain: Secondary | ICD-10-CM | POA: Diagnosis not present

## 2016-07-21 DIAGNOSIS — M4806 Spinal stenosis, lumbar region: Secondary | ICD-10-CM | POA: Diagnosis not present

## 2016-07-22 DIAGNOSIS — E119 Type 2 diabetes mellitus without complications: Secondary | ICD-10-CM | POA: Diagnosis not present

## 2016-07-22 DIAGNOSIS — Z794 Long term (current) use of insulin: Secondary | ICD-10-CM | POA: Diagnosis not present

## 2016-08-18 ENCOUNTER — Ambulatory Visit: Payer: PPO | Admitting: Cardiovascular Disease

## 2016-08-18 ENCOUNTER — Encounter: Payer: Self-pay | Admitting: Cardiovascular Disease

## 2016-08-18 ENCOUNTER — Ambulatory Visit (INDEPENDENT_AMBULATORY_CARE_PROVIDER_SITE_OTHER): Payer: PPO | Admitting: Cardiovascular Disease

## 2016-08-18 VITALS — BP 160/58 | HR 57 | Ht 67.0 in | Wt 208.0 lb

## 2016-08-18 DIAGNOSIS — I25810 Atherosclerosis of coronary artery bypass graft(s) without angina pectoris: Secondary | ICD-10-CM

## 2016-08-18 DIAGNOSIS — E1151 Type 2 diabetes mellitus with diabetic peripheral angiopathy without gangrene: Secondary | ICD-10-CM | POA: Diagnosis not present

## 2016-08-18 DIAGNOSIS — Z23 Encounter for immunization: Secondary | ICD-10-CM

## 2016-08-18 DIAGNOSIS — I5033 Acute on chronic diastolic (congestive) heart failure: Secondary | ICD-10-CM

## 2016-08-18 DIAGNOSIS — I10 Essential (primary) hypertension: Secondary | ICD-10-CM

## 2016-08-18 DIAGNOSIS — Z951 Presence of aortocoronary bypass graft: Secondary | ICD-10-CM

## 2016-08-18 DIAGNOSIS — R0602 Shortness of breath: Secondary | ICD-10-CM

## 2016-08-18 DIAGNOSIS — E785 Hyperlipidemia, unspecified: Secondary | ICD-10-CM

## 2016-08-18 MED ORDER — LOSARTAN POTASSIUM 50 MG PO TABS: 50.0000 mg | ORAL_TABLET | Freq: Every day | ORAL | 3 refills | Status: DC

## 2016-08-18 NOTE — Progress Notes (Signed)
Cardiology Office Note  Date:  08/18/2016   ID:  Joseph [name], DOB 1925/01/13, MRN [id number]  PCP:  Joseph Durie, MD   Chief Complaint  Patient presents with  . other    3 month f/u pt wants to discuss shots for spinal stenosis. Meds reviewed verbally with pt.    HPI:  Mr. Joseph [name] is a 82 year old gentleman with a hx of CAD, CABG in 1994 per the wife, PAD with reported stenosis in the past requiring intervention (details unavailable), followed by Dr. Lucky Cowboy, obesity, hyperlipidemia, arthritis of the knees, who previously presented with increasing fatigue, SOB, leg weakness, Prior episodes of lower extremity edema, now presenting for routine followup of his coronary artery disease and history of acute on chronic diastolic CHF. He is a patient of Dr. Rosanna Randy.  In follow-up today he reports that he feels well Joseph [name] out 05/20/2016 Since then has felt better  Also reports that Dr. Gabriel Carina "took me off some pills" Weight dropped, 202 Trying to watch his diet Denies any chest pain concerning for angina  Lab work reviewed with him HCT 30, creatinine 0.8 Climb in LFTs AST 104, ALT 83  On 05/21/2016 HBA1C 6.4 in 03/29/2016  Was told by orthopedics he had Spinal stenosis? Recommended to have cortisone injections Legs get "heavy" with walking, sits 2 minutes  Does not feel that he needs injections  Home blood pressure: 140s at home  Other past medical history admission May 2017 for acute cholecystitis, developing acute respiratory distress with hypoxia and acute renal disease, peak creatinine of 2. Notes indicating HIDA scan positive for acute cholecystitis. s/p cholecystostomy tube placed in through interventional radiology 03/24/16 Diuretic was discontinued in the setting of acute renal failure He does report greater than 20 pound weight loss  Echocardiogram 03/22/2016 reviewed with him, ejection fraction 50-55% Chest x-ray 03/29/2016 with small right pleural  effusion  Ultrasound of the legs showing patent right SFA stent, left side CFA and popliteal artery with good flow, waveform deterioration around the ankle on the left  Last Cardiac catheterization was performed at [facility] 08/16/2012 Catheterization showed severe three-vessel coronary artery disease with patent grafts x4. There was 80% disease of a small to moderate sized distal left circumflex. Ejection fraction 45-50%. No significant aortic valve stenosis or mitral valve regurgitation. The findings were discussed with interventional cardiology and medical management was recommended for the distal left circumflex disease.  previous Echocardiogram did not show elevated right-sided pressures and ejection fraction was normal. Previous episode of tachycardia in June 2013 that resolved without intervention  Total cholesterol 143, LDL 82  PMH:   has a past medical history of CAD (coronary artery disease); Chronic diastolic CHF (congestive heart failure) ([place]); Diabetes mellitus, type 2 ([place]); HTN (hypertension); Hyperlipidemia; Hypothyroidism; Lower extremity edema; and Spinal stenosis.  PSH:    Past Surgical History:  Procedure Laterality Date  . APPENDECTOMY    . CARDIAC CATHETERIZATION  07/2012   [facility]/no stents  . CHOLECYSTECTOMY    . CORNEAL TRANSPLANT    . CORONARY ARTERY BYPASS GRAFT  1994  . HEMORRHOID SURGERY    . PROSTATE BIOPSY    . TOE SURGERY     ingrown toe nail    Current Outpatient Prescriptions  Medication Sig Dispense Refill  . amLODipine (NORVASC) 2.5 MG tablet Take 1 tablet (2.5 mg total) by mouth daily. 30 tablet 5  . aspirin EC 81 MG tablet Take 1 tablet (81 mg total) by mouth daily. 90 tablet 3  .  atorvastatin (LIPITOR) 40 MG tablet Take 1 tablet (40 mg total) by mouth daily. 90 tablet 3  . blood glucose meter kit and supplies KIT Dispense Accuchek Aviva glucometer with 120 test strips.  Test glucose level 3 times per day with meals and at bedtime.  Dispense based  on patient and insurance preference. Use up to four times daily as directed. (FOR ICD-9 250.00, 250.01, ICD 10 E11.9). 1 each 0  . cholecalciferol (VITAMIN D) 1000 units tablet Take 1,000 Units by mouth daily.    Marland Kitchen glucose blood (ONE TOUCH ULTRA TEST) test strip Check sugar 4 times daily DX:E 11.51 100 each 12  . insulin aspart (NOVOLOG) 100 UNIT/ML injection Inject 0-9 Units into the skin 3 (three) times daily with meals. (Patient taking differently: Inject 0-9 Units into the skin as needed. 26 am, 16 lunch, 28 pm) 10 mL 0  . insulin glargine (LANTUS) 100 UNIT/ML injection Inject 0.68 mLs (68 Units total) into the skin at bedtime. (Patient taking differently: Inject 68 Units into the skin at bedtime. 16 am and 65 at bedtime) 10 mL 0  . levothyroxine (SYNTHROID, LEVOTHROID) 50 MCG tablet Take 1 tablet (50 mcg total) by mouth daily before breakfast. 30 tablet 12  . Magnesium 250 MG TABS Take by mouth daily as needed.    . nitroGLYCERIN (NITROSTAT) 0.4 MG SL tablet Place 1 tablet (0.4 mg total) under the tongue every 5 (five) minutes as needed for chest pain. 25 tablet 3  . omeprazole (PRILOSEC) 20 MG capsule Take 20 mg by mouth daily.    Glory Rosebush DELICA LANCETS 44Y MISC Check sugar 4 times daily. DX E11.51 100 each 12  . potassium chloride (K-DUR,KLOR-CON) 10 MEQ tablet Take 10 mEq by mouth daily as needed.    . torsemide (DEMADEX) 20 MG tablet Take 20 mg by mouth daily as needed.    . vitamin C (ASCORBIC ACID) 500 MG tablet Take 500 mg by mouth daily.    Marland Kitchen losartan (COZAAR) 50 MG tablet Take 1 tablet (50 mg total) by mouth daily. 90 tablet 3   No current facility-administered medications for this visit.      Allergies:   Problem with lisinopril   Social History:  The patient  reports that he quit smoking about 35 years ago. His smoking use included Pipe and Cigars. He smoked 0.00 packs per day for 6.00 years. He has never used smokeless tobacco. He reports that he does not drink alcohol or  use drugs.   Family History:   family history includes Heart attack in his father; Heart failure in his maternal grandfather, maternal uncle, and mother.    Review of Systems: Review of Systems  Constitutional: Negative.   Respiratory: Negative.   Cardiovascular: Positive for leg swelling.  Gastrointestinal: Negative.   Musculoskeletal: Negative.   Neurological: Negative.   Psychiatric/Behavioral: Negative.   All other systems reviewed and are negative.    PHYSICAL EXAM: VS:  BP (!) 160/58 (BP Location: Left Arm, Patient Position: Sitting, Cuff Size: Normal)   Pulse (!) 57   Ht 5' 7"  (1.702 m)   Wt 208 lb (94.3 kg)   BMI 32.58 kg/m  , BMI Body mass index is 32.58 kg/m. GEN: Well nourished, well developed, in no acute distress , obese HEENT: normal  Neck: no JVD, carotid bruits, or masses Cardiac: RRR; no murmurs, rubs, or gallops, trace edema on the left LE Respiratory:  clear to auscultation bilaterally, normal work of breathing GI: soft, nontender, nondistended, +  BS MS: no deformity or atrophy  Skin: warm and dry, no rash Neuro:  Strength and sensation are intact Psych: euthymic mood, full affect    Recent Labs: 03/26/2016: Magnesium 2.5 03/27/2016: Hemoglobin 10.9 06/16/2016: ALT 11; BUN 15; Creatinine, Ser 0.93; Platelets 211; Potassium 4.6; Sodium 140; TSH 1.630    Lipid Panel Lab Results  Component Value Date   CHOL 148 07/06/2015   HDL 36 07/06/2015   LDLCALC 91 07/06/2015   TRIG 191 (A) 07/06/2015      Wt Readings from Last 3 Encounters:  08/18/16 208 lb (94.3 kg)  06/22/16 212 lb (96.2 kg)  06/16/16 213 lb (96.6 kg)       ASSESSMENT AND PLAN:  HYPERTENSION, BENIGN Blood pressure elevated on today's visit. We'll not increase amlodipine given leg swelling Recommended he start losartan 50 mg daily Dose could be increased if needed  Atherosclerosis of coronary artery bypass graft of native heart without angina pectoris Currently with no  symptoms of angina. No further workup at this time. Continue current medication regimen.  Encounter for immunization - Plan: Flu Vaccine QUAD 36+ mos IM Had flu shot today  Diabetes mellitus with peripheral vascular disease ([place]) We have encouraged continued exercise, careful diet management in an effort to lose weight.  Hyperlipidemia Cholesterol close to goal, recommended continued weight loss  Diastolic CHF, acute on chronic (HCC) Appears relatively euvolemic. Trace leg edema Recommended he take extra torsemide for any worsening swelling  S/P CABG (coronary artery bypass graft) Currently with no symptoms of angina, no plan for further testing at this time  SOB (shortness of breath) Mild shortness of breath, recommended exercise program, continued weight loss   Total encounter time more than 25 minutes  Greater than 50% was spent in counseling and coordination of care with the patient   Disposition:   F/U  6 months   Orders Placed This Encounter  Procedures  . Flu Vaccine QUAD 36+ mos IM     Signed, Esmond Plants, M.D., Ph.D. 08/18/2016  [place], [place]

## 2016-08-18 NOTE — Patient Instructions (Signed)
Medication Instructions:   Please start losartan one a day for blood rpessure Helps kidney and heart  Labwork:  No new labs needed  Testing/Procedures:  No further testing at this time   Follow-Up: It was a pleasure seeing you in the office today. Please call us if you have new issues that need to be addressed before your next appt.  [phone]  Your physician wants you to follow-up in: 6 months.  You will receive a reminder letter in the mail two months in advance. If you don't receive a letter, please call our office to schedule the follow-up appointment.  If you need a refill on your cardiac medications before your next appointment, please call your pharmacy.

## 2016-09-28 ENCOUNTER — Ambulatory Visit (INDEPENDENT_AMBULATORY_CARE_PROVIDER_SITE_OTHER): Payer: PPO | Admitting: Family Medicine

## 2016-09-28 ENCOUNTER — Encounter: Payer: Self-pay | Admitting: Family Medicine

## 2016-09-28 VITALS — BP 136/60 | HR 58 | Temp 97.5°F | Resp 16 | Wt 206.0 lb

## 2016-09-28 DIAGNOSIS — E785 Hyperlipidemia, unspecified: Secondary | ICD-10-CM

## 2016-09-28 DIAGNOSIS — Z794 Long term (current) use of insulin: Secondary | ICD-10-CM | POA: Diagnosis not present

## 2016-09-28 DIAGNOSIS — I10 Essential (primary) hypertension: Secondary | ICD-10-CM

## 2016-09-28 DIAGNOSIS — E1122 Type 2 diabetes mellitus with diabetic chronic kidney disease: Secondary | ICD-10-CM | POA: Diagnosis not present

## 2016-09-28 NOTE — Progress Notes (Signed)
Subjective:  HPI  Hypertension, follow-up:  BP Readings from Last 3 Encounters:  09/28/16 136/60  08/18/16 (!) 160/58  06/22/16 (!) 148/60    He was last seen for hypertension 3 months ago.  BP at that visit was 160/58. Management since that visit includes none. He reports good compliance with treatment. He is not having side effects.  He is not exercising. He is adherent to low salt diet.   Outside blood pressures are "normal" per pt . He is experiencing none.  Patient denies chest pain, chest pressure/discomfort, claudication, dyspnea, exertional chest pressure/discomfort, fatigue, irregular heart beat, lower extremity edema, near-syncope, orthopnea, palpitations, paroxysmal nocturnal dyspnea, syncope and tachypnea.   Cardiovascular risk factors include advanced age (older than 20 for men, 45 for women), diabetes mellitus, dyslipidemia, hypertension and male gender.   Wt Readings from Last 3 Encounters:  09/28/16 206 lb (93.4 kg)  08/18/16 208 lb (94.3 kg)  06/22/16 212 lb (96.2 kg)   ------------------------------------------------------------------------   Diabetes Mellitus Type II, Follow-up:  Followed by Dr. Gabriel Carina  Lab Results  Component Value Date   HGBA1C 6.4 03/29/2016   HGBA1C 7.1 (A) 07/06/2015    Last seen for diabetes 3 months ago.  Management since then includes none. He reports good compliance with treatment. He is not having side effects.  Home blood sugar records: 110-140 about 175 at night  Episodes of hypoglycemia? no   Current Insulin Regimen: sliding scale  Pertinent Labs:    Component Value Date/Time   CHOL 148 07/06/2015   CHOL 141 02/15/2013 0917   TRIG 191 (A) 07/06/2015   HDL 36 07/06/2015   HDL 38 (L) 02/15/2013 0917   LDLCALC 91 07/06/2015   LDLCALC 81 02/15/2013 0917   CREATININE 0.93 06/16/2016 1514    Wt Readings from Last 3 Encounters:  09/28/16 206 lb (93.4 kg)  08/18/16 208 lb (94.3 kg)  06/22/16 212 lb (96.2 kg)     ------------------------------------------------------------------------   Lipid/Cholesterol, Follow-up:   Last seen for this3 months ago.  Management changes since that visit include none. . Last Lipid Panel:    Component Value Date/Time   CHOL 148 07/06/2015   CHOL 141 02/15/2013 0917   TRIG 191 (A) 07/06/2015   HDL 36 07/06/2015   HDL 38 (L) 02/15/2013 0917   CHOLHDL 3.7 02/15/2013 0917   LDLCALC 91 07/06/2015   LDLCALC 81 02/15/2013 0917    He is having side effects.   Wt Readings from Last 3 Encounters:  09/28/16 206 lb (93.4 kg)  08/18/16 208 lb (94.3 kg)  06/22/16 212 lb (96.2 kg)    -------------------------------------------------------------------    Prior to Admission medications   Medication Sig Start Date End Date Taking? Authorizing Provider  amLODipine (NORVASC) 2.5 MG tablet Take 1 tablet (2.5 mg total) by mouth daily. 06/28/16   [name]., MD  aspirin EC 81 MG tablet Take 1 tablet (81 mg total) by mouth daily. 03/17/16   Areta Haber Dunn, PA-C  atorvastatin (LIPITOR) 40 MG tablet Take 1 tablet (40 mg total) by mouth daily. 10/26/15   Minna Merritts, MD  blood glucose meter kit and supplies KIT Dispense Accuchek Aviva glucometer with 120 test strips.  Test glucose level 3 times per day with meals and at bedtime.  Dispense based on patient and insurance preference. Use up to four times daily as directed. (FOR ICD-9 250.00, 250.01, ICD 10 E11.9). 03/28/16   Lance Coon, MD  cholecalciferol (VITAMIN D) 1000 units tablet  Take 1,000 Units by mouth daily.    Historical Provider, MD  glucose blood (ONE TOUCH ULTRA TEST) test strip Check sugar 4 times daily DX:E 11.51 04/15/16   Margarita Rana, MD  insulin aspart (NOVOLOG) 100 UNIT/ML injection Inject 0-9 Units into the skin 3 (three) times daily with meals. Patient taking differently: Inject 0-9 Units into the skin as needed. 26 am, 16 lunch, 28 pm 03/28/16   Demetrios Loll, MD  insulin glargine (LANTUS) 100  UNIT/ML injection Inject 0.68 mLs (68 Units total) into the skin at bedtime. Patient taking differently: Inject 68 Units into the skin at bedtime. 16 am and 65 at bedtime 03/28/16   Demetrios Loll, MD  levothyroxine (SYNTHROID, LEVOTHROID) 50 MCG tablet Take 1 tablet (50 mcg total) by mouth daily before breakfast. 06/01/16   [name]., MD  losartan (COZAAR) 50 MG tablet Take 1 tablet (50 mg total) by mouth daily. 08/18/16 08/18/17  Minna Merritts, MD  Magnesium 250 MG TABS Take by mouth daily as needed.    Historical Provider, MD  nitroGLYCERIN (NITROSTAT) 0.4 MG SL tablet Place 1 tablet (0.4 mg total) under the tongue every 5 (five) minutes as needed for chest pain. 04/29/14   Rogelia Mire, NP  omeprazole (PRILOSEC) 20 MG capsule Take 20 mg by mouth daily.    Historical Provider, MD  [facility] DELICA LANCETS 70Y MISC Check sugar 4 times daily. DX E11.51 04/15/16   Margarita Rana, MD  potassium chloride (K-DUR,KLOR-CON) 10 MEQ tablet Take 10 mEq by mouth daily as needed.    Historical Provider, MD  torsemide (DEMADEX) 20 MG tablet Take 20 mg by mouth daily as needed.    Historical Provider, MD  vitamin C (ASCORBIC ACID) 500 MG tablet Take 500 mg by mouth daily.    Historical Provider, MD    Patient Active Problem List   Diagnosis Date Noted  . Acute cholecystitis   . Abdominal pain   . Calculus of gallbladder with acute cholecystitis without obstruction   . Diverticulitis of large intestine without perforation or abscess without bleeding   . Pain of upper abdomen   . SOB (shortness of breath)   . S/P CABG (coronary artery bypass graft)   . Coronary artery disease involving coronary bypass graft of native heart with unspecified angina pectoris   . Morbid obesity due to excess calories ([place])   . Acute respiratory failure with hypoxia ([place]) 03/21/2016  . Back pain, chronic 07/28/2015  . Benign fibroma of prostate 07/28/2015  . Acid reflux 07/28/2015  . Adult hypothyroidism 07/28/2015    . Eunuchoidism 07/28/2015  . Neuropathy ([place]) 07/28/2015  . Arthritis, degenerative 07/28/2015  . Psoriasis 07/28/2015  . Obesity 09/09/2014  . Bilateral leg weakness 09/09/2014  . Diastolic CHF, acute on chronic ([place]) 05/13/2014  . Bilateral leg edema 04/15/2014  . Bradycardia 08/13/2013  . Edema 06/21/2012  . Fatigue 09/15/2011  . Diabetes mellitus with peripheral vascular disease ([place]) 09/15/2011  . DYSPNEA 06/09/2009  . Hyperlipidemia 06/07/2009  . HYPERTENSION, BENIGN 06/07/2009  . CAD, ARTERY BYPASS GRAFT 06/07/2009    Past Medical History:  Diagnosis Date  . CAD (coronary artery disease)    a. s/p CABG;  b. 07/2012 Cath: 3VD including 80 dLCX (med Rx), 4/4 patent grafts.  . Chronic diastolic CHF (congestive heart failure) ([place])    a. 07/2012 Echo: EF 55-60%, no rwma, Gr 1 DD, mild MR;  04/2014 Echo: EF 55-60%, no rwma, mild MR, mildly dil LA.  Marland Kitchen  Diabetes mellitus, type 2 ([place])   . HTN (hypertension)   . Hyperlipidemia   . Hypothyroidism   . Lower extremity edema    a. 03/2014 amlodipine d/c'd.  Marland Kitchen Spinal stenosis     Social History   Social History  . Marital status: Married    Spouse name: N/A  . Number of children: N/A  . Years of education: N/A   Occupational History  . Not on file.   Social History Main Topics  . Smoking status: Former Smoker    Packs/day: 0.00    Years: 6.00    Types: Pipe, Cigars    Quit date: 12/23/1980  . Smokeless tobacco: Never Used  . Alcohol use No  . Drug use: No  . Sexual activity: No   Other Topics Concern  . Not on file   Social History Narrative   Retired.     No Known Allergies  Review of Systems  Constitutional: Negative.   HENT: Negative.   Eyes: Negative.   Respiratory: Negative.   Cardiovascular: Negative.   Gastrointestinal: Negative.   Genitourinary: Negative.   Musculoskeletal: Negative.   Skin: Negative.   Neurological: Negative.   Endo/Heme/Allergies: Negative.   Psychiatric/Behavioral: Negative.      Immunization History  Administered Date(s) Administered  . Influenza, High Dose Seasonal PF 09/01/2015  . Influenza,inj,Quad PF,36+ Mos 08/18/2016    Objective:  BP 136/60 (BP Location: Left Arm, Patient Position: Sitting, Cuff Size: Normal)   Pulse (!) 58   Temp 97.5 F (36.4 C) (Oral)   Resp 16   Wt 206 lb (93.4 kg)   BMI 32.26 kg/m   Physical Exam  Constitutional: He is oriented to person, place, and time and well-developed, well-nourished, and in no distress.  Pleasant, morbidly obese white male in no acute distress  HENT:  Head: Normocephalic and atraumatic.  Right Ear: External ear normal.  Left Ear: External ear normal.  Nose: Nose normal.  Eyes: Conjunctivae and EOM are normal. Pupils are equal, round, and reactive to light.  Neck: Normal range of motion. Neck supple.  Cardiovascular: Normal rate, regular rhythm, normal heart sounds and intact distal pulses.   Pulmonary/Chest: Effort normal and breath sounds normal.  Abdominal: Soft.  Musculoskeletal: Normal range of motion.  Neurological: He is alert and oriented to person, place, and time. He has normal reflexes. Gait normal. GCS score is 15.  Skin: Skin is warm and dry.  Psychiatric: Mood, memory, affect and judgment normal.    Lab Results  Component Value Date   WBC 6.8 06/16/2016   HGB 10.9 (L) 03/27/2016   HCT 33.2 (L) 06/16/2016   PLT 211 06/16/2016   GLUCOSE 113 (H) 06/16/2016   CHOL 148 07/06/2015   TRIG 191 (A) 07/06/2015   HDL 36 07/06/2015   LDLCALC 91 07/06/2015   TSH 1.630 06/16/2016   INR 1.37 03/24/2016   HGBA1C 6.4 03/29/2016    CMP     Component Value Date/Time   NA 140 06/16/2016 1514   K 4.6 06/16/2016 1514   CL 102 06/16/2016 1514   CO2 23 06/16/2016 1514   GLUCOSE 113 (H) 06/16/2016 1514   GLUCOSE 80 03/27/2016 0454   BUN 15 06/16/2016 1514   CREATININE 0.93 06/16/2016 1514   CALCIUM 8.9 06/16/2016 1514   PROT 6.5 06/16/2016 1514   ALBUMIN 3.4 (L) 06/16/2016 1514    AST 19 06/16/2016 1514   ALT 11 06/16/2016 1514   ALKPHOS 62 06/16/2016 1514   BILITOT 0.3 06/16/2016  [place] 06/16/2016 1514   GFRAA 84 06/16/2016 1514    Assessment and Plan :  1. Essential hypertension Stable.   2. Hyperlipidemia, unspecified hyperlipidemia type  - Lipid Panel With LDL/HDL Ratio - Comprehensive metabolic panel  3. Type 2 diabetes mellitus with chronic kidney disease, with long-term current use of insulin, unspecified CKD stage ([place]) Followed by Dr. Gabriel Carina 4.CAD All risk factors treated. 5. Chronic fatigue/chronic back pain/stiffness HPI, Exam, and A&P Transcribed under the direction and in the presence of Amaan Meyer L. Cranford Mon, MD  Electronically Signed: Webb Laws, CMA I have done the exam and reviewed the above chart and it is accurate to the best of my knowledge. Development worker, community has been used in this note in any air is in the dictation or transcription are unintentional.  [facility] 09/28/2016 1:57 PM

## 2016-09-29 DIAGNOSIS — B351 Tinea unguium: Secondary | ICD-10-CM | POA: Diagnosis not present

## 2016-09-29 DIAGNOSIS — Z794 Long term (current) use of insulin: Secondary | ICD-10-CM | POA: Diagnosis not present

## 2016-09-29 DIAGNOSIS — E119 Type 2 diabetes mellitus without complications: Secondary | ICD-10-CM | POA: Diagnosis not present

## 2016-09-29 DIAGNOSIS — M722 Plantar fascial fibromatosis: Secondary | ICD-10-CM | POA: Diagnosis not present

## 2016-10-07 ENCOUNTER — Telehealth: Payer: Self-pay | Admitting: Family Medicine

## 2016-10-07 NOTE — Telephone Encounter (Signed)
Called Pt to schedule AWV with [facility] 5/8- knb

## 2016-10-21 DIAGNOSIS — Z794 Long term (current) use of insulin: Secondary | ICD-10-CM | POA: Diagnosis not present

## 2016-10-21 DIAGNOSIS — E119 Type 2 diabetes mellitus without complications: Secondary | ICD-10-CM | POA: Diagnosis not present

## 2016-12-01 ENCOUNTER — Other Ambulatory Visit: Payer: Self-pay | Admitting: *Deleted

## 2016-12-01 MED ORDER — ATORVASTATIN CALCIUM 40 MG PO TABS: 40.0000 mg | ORAL_TABLET | Freq: Every day | ORAL | 3 refills | Status: DC

## 2016-12-06 DIAGNOSIS — E785 Hyperlipidemia, unspecified: Secondary | ICD-10-CM | POA: Diagnosis not present

## 2016-12-07 LAB — COMPREHENSIVE METABOLIC PANEL
ALBUMIN: 3.4 g/dL — AB (ref 3.5–4.7)
ALT: 15 IU/L (ref 0–44)
AST: 14 IU/L (ref 0–40)
Albumin/Globulin Ratio: 1.3 (ref 1.2–2.2)
Alkaline Phosphatase: 58 IU/L (ref 39–117)
BUN / CREAT RATIO: 15 (ref 10–24)
BUN: 13 mg/dL (ref 8–27)
Bilirubin Total: 0.6 mg/dL (ref 0.0–1.2)
CALCIUM: 9.1 mg/dL (ref 8.6–10.2)
CO2: 29 mmol/L (ref 18–29)
CREATININE: 0.85 mg/dL (ref 0.76–1.27)
Chloride: 105 mmol/L (ref 96–106)
GFR calc Af Amer: 89 mL/min/{1.73_m2} (ref >59)
GFR, EST NON AFRICAN AMERICAN: 77 mL/min/{1.73_m2} (ref >59)
GLOBULIN, TOTAL: 2.7 g/dL (ref 1.5–4.5)
Glucose: 109 mg/dL — ABNORMAL HIGH (ref 65–99)
Potassium: 4.7 mmol/L (ref 3.5–5.2)
SODIUM: 145 mmol/L — AB (ref 134–144)
Total Protein: 6.1 g/dL (ref 6.0–8.5)

## 2016-12-07 LAB — LIPID PANEL WITH LDL/HDL RATIO
CHOLESTEROL TOTAL: 138 mg/dL (ref 100–199)
HDL: 44 mg/dL (ref >39)
LDL Calculated: 77 mg/dL (ref 0–99)
LDl/HDL Ratio: 1.8 ratio units (ref 0.0–3.6)
Triglycerides: 85 mg/dL (ref 0–149)
VLDL Cholesterol Cal: 17 mg/dL (ref 5–40)

## 2016-12-09 ENCOUNTER — Ambulatory Visit: Payer: PPO

## 2016-12-09 NOTE — Progress Notes (Signed)
Patient advised  ED 

## 2016-12-23 ENCOUNTER — Ambulatory Visit (INDEPENDENT_AMBULATORY_CARE_PROVIDER_SITE_OTHER): Payer: PPO

## 2016-12-23 VITALS — BP 138/70 | HR 60 | Temp 97.8°F | Ht 67.0 in | Wt 207.2 lb

## 2016-12-23 DIAGNOSIS — Z0000 Encounter for general adult medical examination without abnormal findings: Secondary | ICD-10-CM | POA: Diagnosis not present

## 2016-12-23 NOTE — Progress Notes (Signed)
Subjective:   Joseph [name] is a 83 y.o. male who presents for Medicare Annual/Subsequent preventive examination.  Review of Systems:  N/A  Cardiac Risk Factors include: advanced age (>63mn, >>81women);diabetes mellitus;dyslipidemia;hypertension;male gender;obesity (BMI >30kg/m2)     Objective:    Vitals: BP 138/70 (BP Location: Left Arm)   Pulse 60   Temp 97.8 F (36.6 C) (Oral)   Ht 5' 7"  (1.702 m)   Wt 207 lb 3.2 oz (94 kg)   BMI 32.45 kg/m   Body mass index is 32.45 kg/m.  Tobacco History  Smoking Status  . Former Smoker  . Packs/day: 0.00  . Years: 6.00  . Types: Pipe, Cigars  . Quit date: 12/23/1980  Smokeless Tobacco  . Never Used     Counseling given: Not Answered   Past Medical History:  Diagnosis Date  . CAD (coronary artery disease)    a. s/p CABG;  b. 07/2012 Cath: 3VD including 80 dLCX (med Rx), 4/4 patent grafts.  . Chronic diastolic CHF (congestive [place]) [place]    a. 07/2012 Echo: EF 55-60%, no rwma, Gr 1 DD, mild MR;  04/2014 Echo: EF 55-60%, no rwma, mild MR, mildly dil LA.  . Diabetes mellitus, type 2 ([place]   . HTN (hypertension)   . Hyperlipidemia   . Hypothyroidism   . Lower extremity edema    a. 03/2014 amlodipine d/c'd.  .Marland KitchenSpinal stenosis    Past Surgical History:  Procedure Laterality Date  . APPENDECTOMY    . CARDIAC CATHETERIZATION  07/2012   [facility]/no stents  . CHOLECYSTECTOMY    . CORNEAL TRANSPLANT    . CORONARY ARTERY BYPASS GRAFT  1994  . HEMORRHOID SURGERY    . PROSTATE BIOPSY    . TOE SURGERY     ingrown toe nail   Family History  Problem Relation Age of Onset  . Heart failure Mother   . Heart attack Father   . Stroke Brother   . Heart failure Maternal Uncle   . Heart failure Maternal Grandfather    History  Sexual Activity  . Sexual activity: No    Outpatient Encounter Prescriptions as of 12/23/2016  Medication Sig  . aspirin EC 81 MG tablet Take 1 tablet (81 mg total) by mouth daily.  .Marland Kitchenatorvastatin  (LIPITOR) 40 MG tablet Take 1 tablet (40 mg total) by mouth daily.  . blood glucose meter kit and supplies KIT Dispense Accuchek Aviva glucometer with 120 test strips.  Test glucose level 3 times per day with meals and at bedtime.  Dispense based on patient and insurance preference. Use up to four times daily as directed. (FOR ICD-9 250.00, 250.01, ICD 10 E11.9).  . calcipotriene (DOVONOX) 0.005 % cream Apply topically 2 (two) times daily.  . Cholecalciferol (VITAMIN D3) 2000 units TABS Take 1 capsule by mouth daily.  . clobetasol cream (TEMOVATE) 07.51% Apply 1 application topically 2 (two) times daily.  .Marland Kitchenglucose blood (ONE TOUCH ULTRA TEST) test strip Check sugar 4 times daily DX:E 11.51  . hydrocortisone 2.5 % cream Apply topically 2 (two) times daily.  . insulin aspart (NOVOLOG) 100 UNIT/ML injection Inject 0-9 Units into the skin 3 (three) times daily with meals. (Patient taking differently: Inject 0-9 Units into the skin as needed. 26 am, 16 lunch, 28 pm)  . insulin glargine (LANTUS) 100 UNIT/ML injection Inject 0.68 mLs (68 Units total) into the skin at bedtime. (Patient taking differently: Inject 55 Units into the skin at bedtime. 1[place]  am and 65 at bedtime)  . ketoconazole (NIZORAL) 2 % cream Apply 1 application topically 2 (two) times daily as needed for irritation.  Marland Kitchen levothyroxine (SYNTHROID, LEVOTHROID) 50 MCG tablet Take 1 tablet (50 mcg total) by mouth daily before breakfast. (Patient taking differently: Take 50 mcg by mouth daily before breakfast. )  . losartan (COZAAR) 50 MG tablet Take 1 tablet (50 mg total) by mouth daily.  . Magnesium 250 MG TABS Take by mouth daily as needed.  . mometasone (ELOCON) 0.1 % ointment Apply topically daily.  . nitroGLYCERIN (NITROSTAT) 0.4 MG SL tablet Place 1 tablet (0.4 mg total) under the tongue every 5 (five) minutes as needed for chest pain.  Marland Kitchen omeprazole (PRILOSEC) 20 MG capsule Take 20 mg by mouth daily.  Glory Rosebush DELICA LANCETS 12R MISC Check  sugar 4 times daily. DX E11.51  . potassium chloride (K-DUR,KLOR-CON) 10 MEQ tablet Take 10 mEq by mouth daily as needed.  . torsemide (DEMADEX) 20 MG tablet Take 20 mg by mouth daily as needed.  . vitamin C (ASCORBIC ACID) 500 MG tablet Take 500 mg by mouth daily.  Marland Kitchen amLODipine (NORVASC) 2.5 MG tablet Take 1 tablet (2.5 mg total) by mouth daily. (Patient not taking: Reported on 12/23/2016)  . cholecalciferol (VITAMIN D) 1000 units tablet Take 1,000 Units by mouth daily.   No facility-administered encounter medications on file as of 12/23/2016.     Activities of Daily Living In your present state of health, do you have any difficulty performing the following activities: 12/23/2016 03/21/2016  Hearing? N N  Vision? N N  Difficulty concentrating or making decisions? Y N  Walking or climbing stairs? N N  Dressing or bathing? N N  Doing errands, shopping? N N  Preparing Food and eating ? N -  Using the Toilet? N -  In the past six months, have you accidently leaked urine? N -  Do you have problems with loss of bowel control? N -  Managing your Medications? N -  Managing your Finances? N -  Housekeeping or managing your Housekeeping? N -  Some recent data might be hidden    Patient Care Team: [name]., MD as PCP - General (Unknown Physician Specialty) Minna Merritts, MD as Consulting Physician (Cardiology) Samara Deist, DPM as Referring Physician (Podiatry) Judi Cong, MD as Physician Assistant (Endocrinology)   Assessment:     Exercise Activities and Dietary recommendations Current Exercise Habits: Structured exercise class, Type of exercise: stretching;strength training/weights, Time (Minutes): 60, Frequency (Times/Week): 2, Weekly Exercise (Minutes/Week): 120, Intensity: Mild, Exercise limited by: None identified  Goals    . Increase water intake          Starting 12/23/16, I will starting drinking 4 glasses of water a day.      Fall Risk Fall Risk  12/23/2016  09/28/2016 07/28/2015  Falls in the past year? No No No   Depression Screen PHQ 2/9 Scores 12/23/2016 09/28/2016 07/28/2015  PHQ - 2 Score 0 0 0    Cognitive Function     6CIT Screen 12/23/2016  What Year? 0 points  What month? 0 points  What time? 0 points  Count back from 20 0 points  Months in reverse 0 points  Repeat phrase 2 points  Total Score 2    Immunization History  Administered Date(s) Administered  . Influenza, High Dose Seasonal PF 09/01/2015  . Influenza,inj,Quad PF,36+ Mos 08/18/2016  . Pneumococcal Conjugate-13 03/24/2015  . Pneumococcal Polysaccharide-23 10/18/2014  .  Tdap 08/04/2014  . Zoster 08/04/2014   Screening Tests Health Maintenance  Topic Date Due  . HEMOGLOBIN A1C  03/21/2017  . OPHTHALMOLOGY EXAM  09/21/2017  . FOOT EXAM  09/29/2017  . TETANUS/TDAP  08/04/2024  . INFLUENZA VACCINE  Completed  . ZOSTAVAX  Completed  . PNA vac Low Risk Adult  Completed      Plan:  I have personally reviewed and addressed the Medicare Annual Wellness questionnaire and have noted the following in the patient's chart:  A. Medical and social history B. Use of alcohol, tobacco or illicit drugs  C. Current medications and supplements D. Functional ability and status E.  Nutritional status F.  Physical activity G. Advance directives H. List of other physicians I.  Hospitalizations, surgeries, and ER visits in previous 12 months [place] such as hearing and vision if needed, cognitive and depression L. Referrals and appointments - none  In addition, I have reviewed and discussed with patient certain preventive protocols, quality metrics, and best practice recommendations. A written personalized care plan for preventive services as well as general preventive health recommendations were provided to patient.  See attached scanned questionnaire for additional information.   Signed,  Fabio Neighbors, LPN Nurse Health Advisor   MD Recommendations:  None. I have reviewed the health advisors note, was  available for consultation and I agree with documentation and plan. Miguel Aschoff MD [facility]

## 2016-12-23 NOTE — Patient Instructions (Signed)

## 2016-12-29 ENCOUNTER — Encounter: Payer: Self-pay | Admitting: Family Medicine

## 2016-12-30 IMAGING — US US ABDOMEN LIMITED
1 series · 14 of 25 positions shown · non-contrast
Comparison: None.

CLINICAL DATA: Right upper quadrant tenderness for 1 day.

EXAM:
US ABDOMEN LIMITED - RIGHT UPPER QUADRANT

[Series 1: us abdomen limited · 0.22mm/px · 14 of 38 slices shown]
[im 1/38]
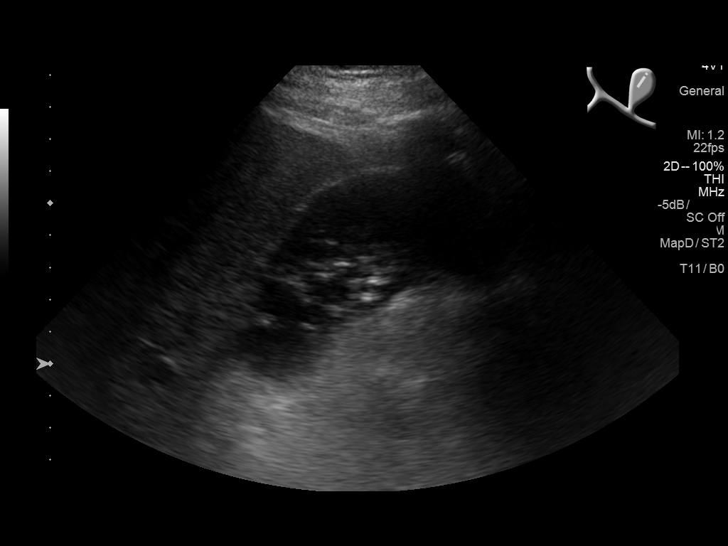
[im 4/38]
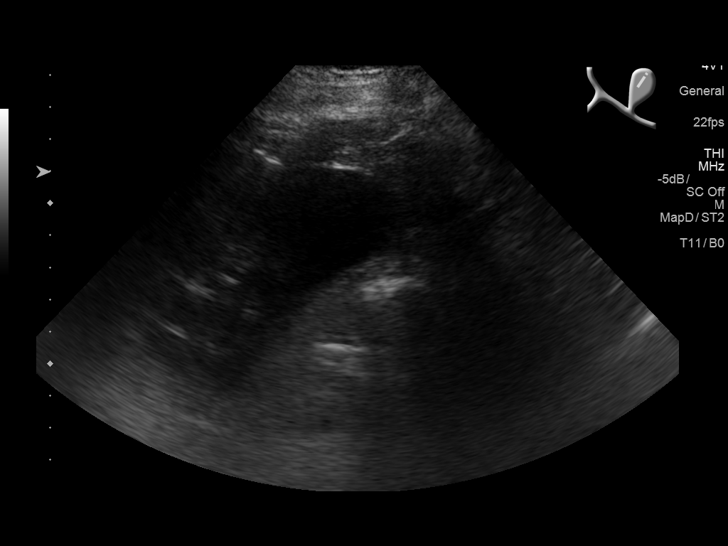
[im 7/38]
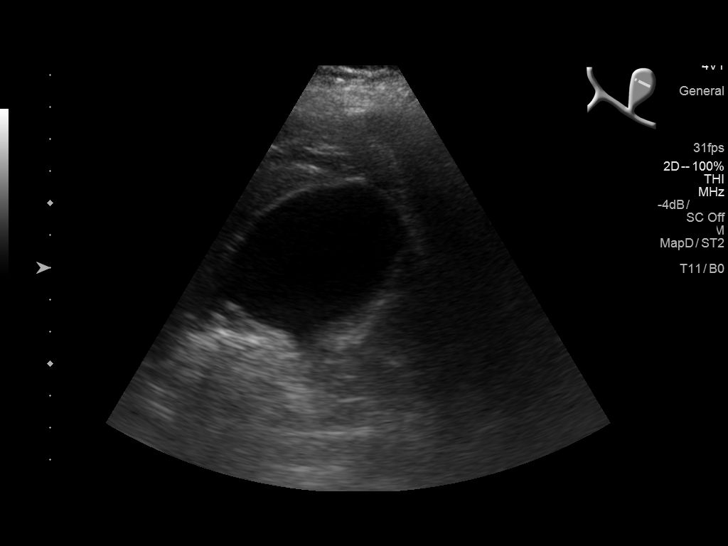
[im 10/38]
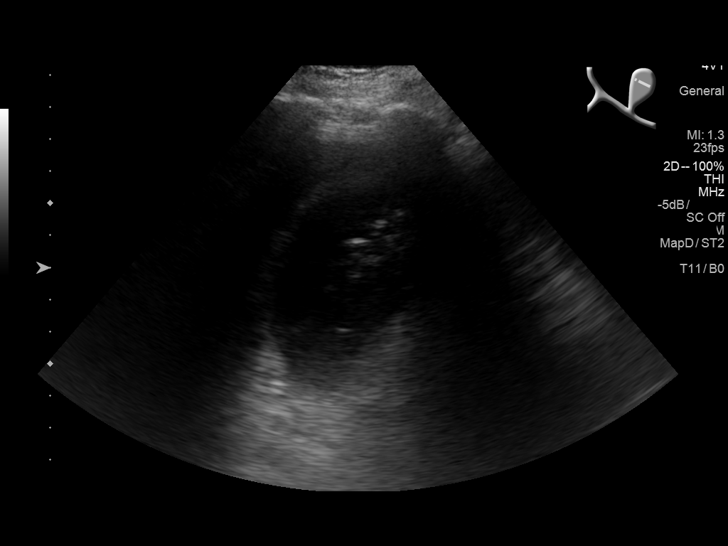
[im 13/38]
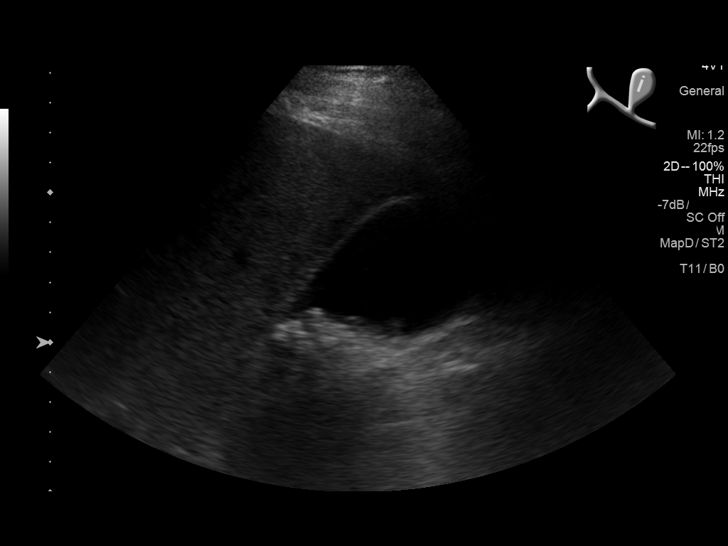
[im 14/38]
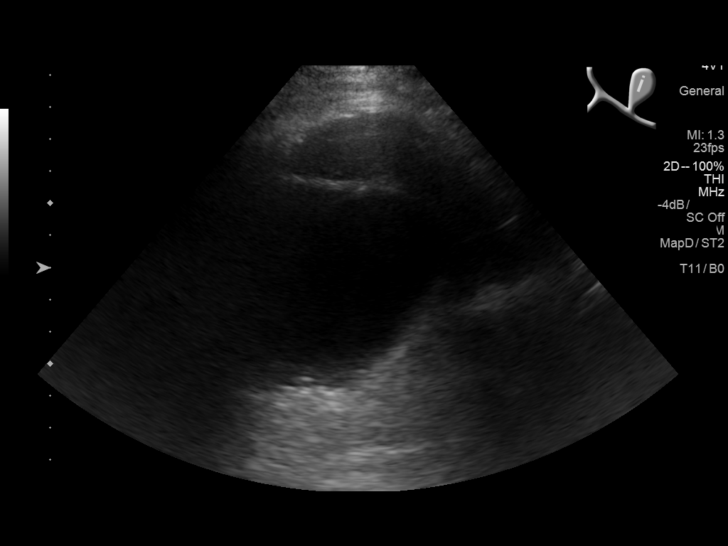
[im 17/38]
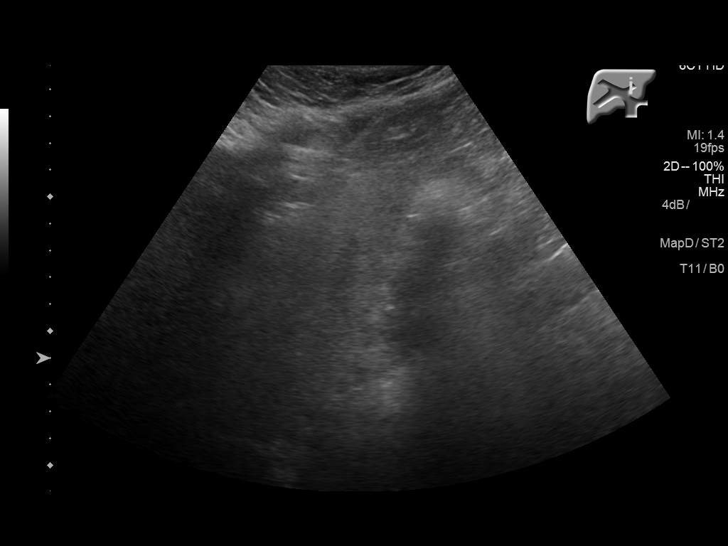
[im 21/38]
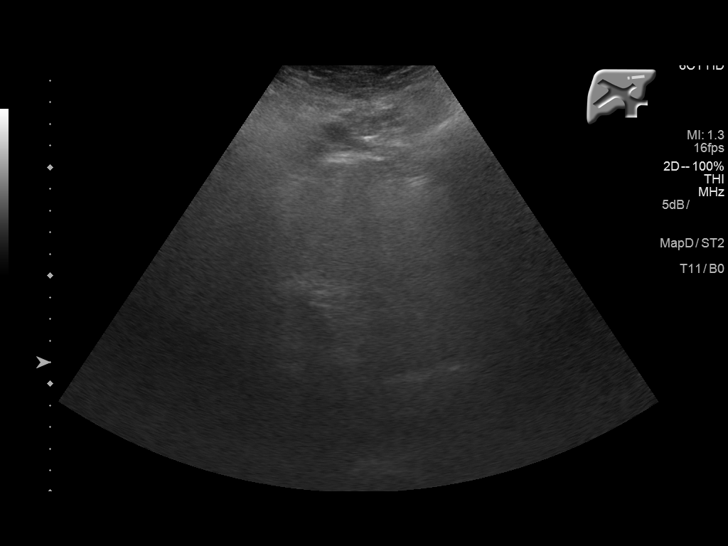
[im 24/38]
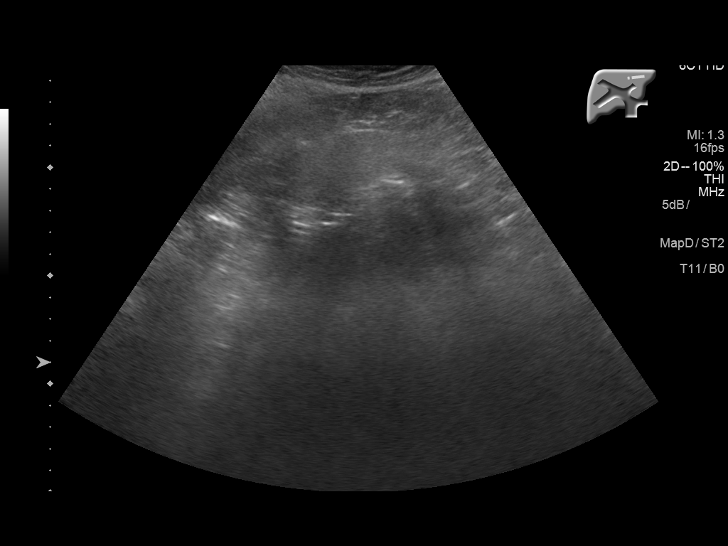
[im 25/38]
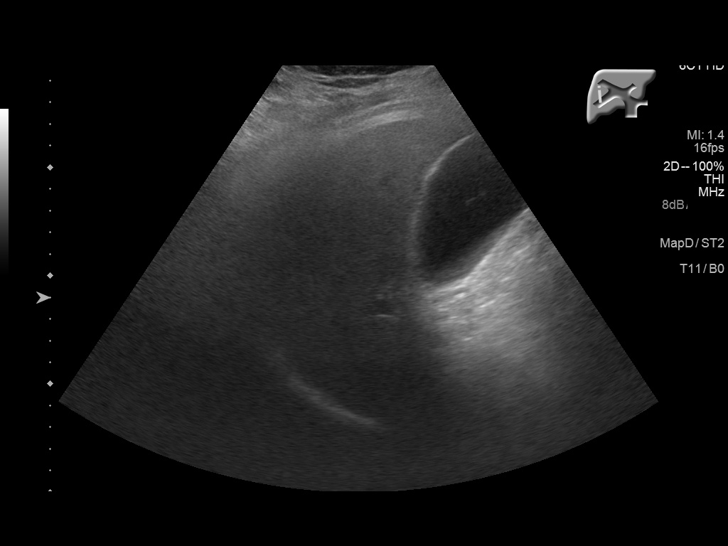
[im 28/38]
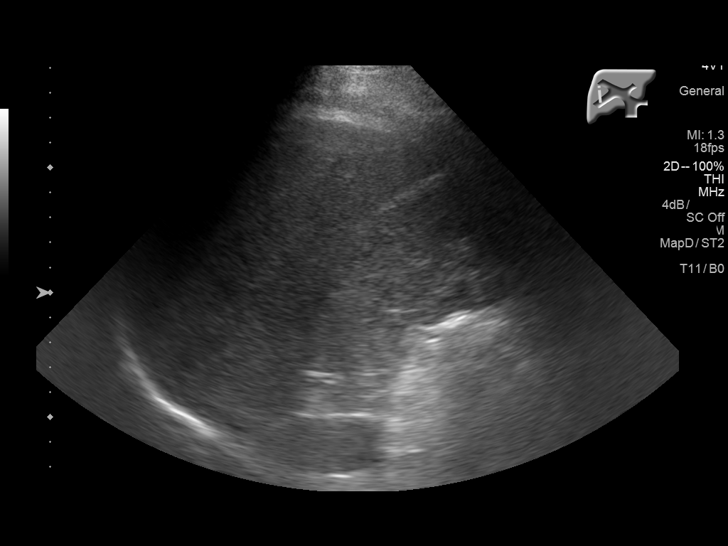
[im 31/38]
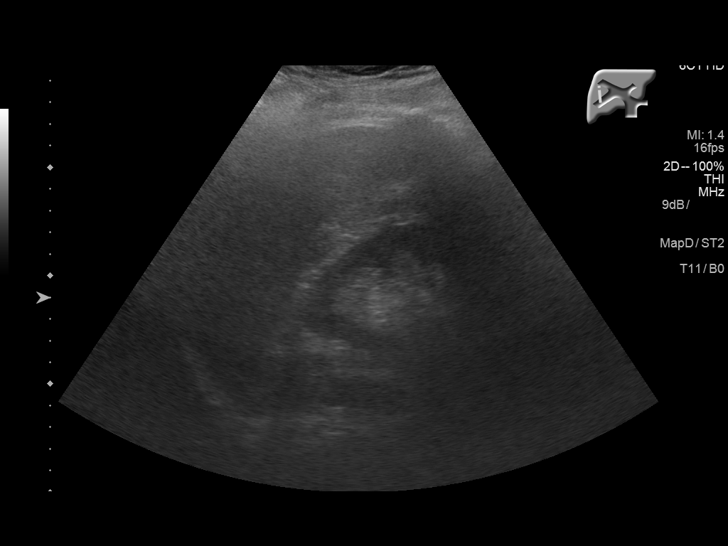
[im 34/38]
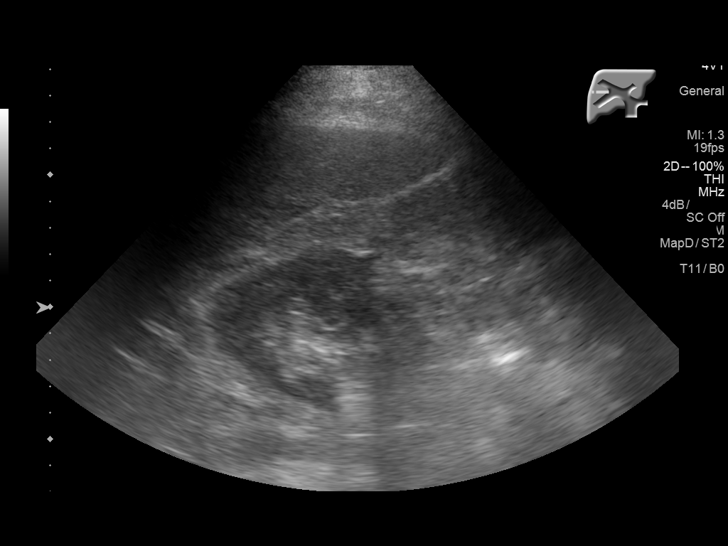
[im 38/38]
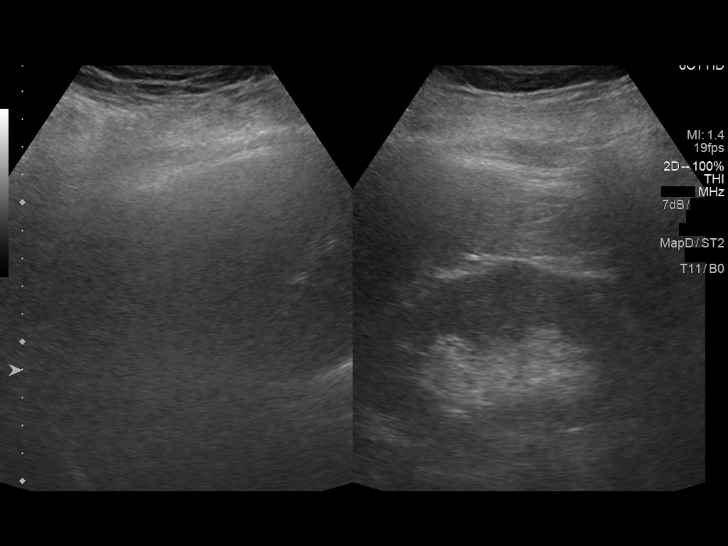

[14 of 25 positions shown; findings below may reference images not displayed]

FINDINGS: Gallbladder:

Innumerable small stones are identified measuring up to 0.6 cm.
There is no gallbladder wall thickening or pericholecystic fluid.
Sonographer reports negative Murphy's sign.

Common bile duct:

Diameter: 0.4 cm.

Liver:

No focal lesion identified. Within normal limits in parenchymal
echogenicity.
IMPRESSION: Multiple small gallstones without evidence of cholecystitis.

## 2017-01-03 DIAGNOSIS — B351 Tinea unguium: Secondary | ICD-10-CM | POA: Diagnosis not present

## 2017-01-03 DIAGNOSIS — E119 Type 2 diabetes mellitus without complications: Secondary | ICD-10-CM | POA: Diagnosis not present

## 2017-01-03 DIAGNOSIS — Z794 Long term (current) use of insulin: Secondary | ICD-10-CM | POA: Diagnosis not present

## 2017-01-20 DIAGNOSIS — E119 Type 2 diabetes mellitus without complications: Secondary | ICD-10-CM | POA: Diagnosis not present

## 2017-01-20 DIAGNOSIS — Z794 Long term (current) use of insulin: Secondary | ICD-10-CM | POA: Diagnosis not present

## 2017-01-22 LAB — HEMOGLOBIN A1C: HEMOGLOBIN A1C: 7

## 2017-02-03 ENCOUNTER — Emergency Department
Admission: EM | Admit: 2017-02-03 | Discharge: 2017-02-03 | Disposition: A | Discharge status: Home / Self Care | Payer: PPO | Attending: Emergency Medicine | Admitting: Emergency Medicine

## 2017-02-03 ENCOUNTER — Encounter: Payer: Self-pay | Admitting: Emergency Medicine

## 2017-02-03 ENCOUNTER — Emergency Department: Payer: PPO

## 2017-02-03 DIAGNOSIS — Z87891 Personal history of nicotine dependence: Secondary | ICD-10-CM | POA: Diagnosis not present

## 2017-02-03 DIAGNOSIS — R519 Headache, unspecified: Secondary | ICD-10-CM

## 2017-02-03 DIAGNOSIS — I110 Hypertensive heart disease with heart failure: Secondary | ICD-10-CM | POA: Diagnosis not present

## 2017-02-03 DIAGNOSIS — Z79899 Other long term (current) drug therapy: Secondary | ICD-10-CM | POA: Insufficient documentation

## 2017-02-03 DIAGNOSIS — E039 Hypothyroidism, unspecified: Secondary | ICD-10-CM | POA: Insufficient documentation

## 2017-02-03 DIAGNOSIS — R413 Other amnesia: Secondary | ICD-10-CM | POA: Insufficient documentation

## 2017-02-03 DIAGNOSIS — E119 Type 2 diabetes mellitus without complications: Secondary | ICD-10-CM | POA: Diagnosis not present

## 2017-02-03 DIAGNOSIS — Z7982 Long term (current) use of aspirin: Secondary | ICD-10-CM | POA: Diagnosis not present

## 2017-02-03 DIAGNOSIS — I2510 Atherosclerotic heart disease of native coronary artery without angina pectoris: Secondary | ICD-10-CM | POA: Insufficient documentation

## 2017-02-03 DIAGNOSIS — F05 Delirium due to known physiological condition: Secondary | ICD-10-CM | POA: Diagnosis not present

## 2017-02-03 DIAGNOSIS — R4182 Altered mental status, unspecified: Secondary | ICD-10-CM | POA: Diagnosis not present

## 2017-02-03 DIAGNOSIS — R51 Headache: Secondary | ICD-10-CM | POA: Diagnosis not present

## 2017-02-03 DIAGNOSIS — Z794 Long term (current) use of insulin: Secondary | ICD-10-CM | POA: Insufficient documentation

## 2017-02-03 DIAGNOSIS — I5033 Acute on chronic diastolic (congestive) heart failure: Secondary | ICD-10-CM | POA: Diagnosis not present

## 2017-02-03 LAB — CBC WITH DIFFERENTIAL/PLATELET
Basophils Absolute: 0.1 10*3/uL (ref 0–0.1)
Basophils Relative: 1 %
EOS ABS: 0 10*3/uL (ref 0–0.7)
EOS PCT: 0 %
HEMATOCRIT: 39.2 % — AB (ref 40.0–52.0)
Hemoglobin: 13.6 g/dL (ref 13.0–18.0)
Lymphocytes Relative: 24 %
Lymphs Abs: 2.2 10*3/uL (ref 1.0–3.6)
MCH: 31.6 pg (ref 26.0–34.0)
MCHC: 34.8 g/dL (ref 32.0–36.0)
MCV: 90.8 fL (ref 80.0–100.0)
MONO ABS: 0.6 10*3/uL (ref 0.2–1.0)
MONOS PCT: 6 %
NEUTROS ABS: 6.3 10*3/uL (ref 1.4–6.5)
Neutrophils Relative %: 69 %
PLATELETS: 180 10*3/uL (ref 150–440)
RBC: 4.31 MIL/uL — ABNORMAL LOW (ref 4.40–5.90)
RDW: 14.1 % (ref 11.5–14.5)
WBC: 9.1 10*3/uL (ref 3.8–10.6)

## 2017-02-03 LAB — URINALYSIS, COMPLETE (UACMP) WITH MICROSCOPIC
BILIRUBIN URINE: NEGATIVE
Bacteria, UA: NONE SEEN
Glucose, UA: NEGATIVE mg/dL
HGB URINE DIPSTICK: NEGATIVE
Ketones, ur: NEGATIVE mg/dL
LEUKOCYTES UA: NEGATIVE
NITRITE: NEGATIVE
PH: 7 (ref 5.0–8.0)
Protein, ur: 100 mg/dL — AB
SPECIFIC GRAVITY, URINE: 1.025 (ref 1.005–1.030)

## 2017-02-03 LAB — COMPREHENSIVE METABOLIC PANEL
ALBUMIN: 3.2 g/dL — AB (ref 3.5–5.0)
ALT: 18 U/L (ref 17–63)
ANION GAP: 5 (ref 5–15)
AST: 25 U/L (ref 15–41)
Alkaline Phosphatase: 47 U/L (ref 38–126)
BILIRUBIN TOTAL: 1 mg/dL (ref 0.3–1.2)
BUN: 14 mg/dL (ref 6–20)
CHLORIDE: 104 mmol/L (ref 101–111)
CO2: 25 mmol/L (ref 22–32)
Calcium: 8.5 mg/dL — ABNORMAL LOW (ref 8.9–10.3)
Creatinine, Ser: 0.73 mg/dL (ref 0.61–1.24)
GFR calc Af Amer: >60 mL/min (ref >60)
GFR calc non Af Amer: >60 mL/min (ref >60)
Glucose, Bld: 107 mg/dL — ABNORMAL HIGH (ref 65–99)
POTASSIUM: 4.1 mmol/L (ref 3.5–5.1)
Sodium: 134 mmol/L — ABNORMAL LOW (ref 135–145)
TOTAL PROTEIN: 6.5 g/dL (ref 6.5–8.1)

## 2017-02-03 LAB — TROPONIN I

## 2017-02-03 MED ORDER — SODIUM CHLORIDE 0.9 % IV SOLN
1000.0000 mL | Freq: Once | INTRAVENOUS | Status: AC
  Administered 2017-02-03: 1000 mL via INTRAVENOUS

## 2017-02-03 MED ORDER — KETOROLAC TROMETHAMINE 30 MG/ML IJ SOLN
15.0000 mg | Freq: Once | INTRAMUSCULAR | Status: AC
  Administered 2017-02-03: 15 mg via INTRAVENOUS
  Filled 2017-02-03: qty 1

## 2017-02-03 MED ORDER — METOCLOPRAMIDE HCL 5 MG/ML IJ SOLN
10.0000 mg | Freq: Once | INTRAMUSCULAR | Status: AC
  Administered 2017-02-03: 10 mg via INTRAVENOUS
  Filled 2017-02-03: qty 2

## 2017-02-03 NOTE — ED Triage Notes (Signed)
Pt to ED from home via EMS c/o headache and confusion since last night.  Patient states headache to forehead as well as trouble remembering names.  Presents A&Ox4, denies chest pain or SOB, denies fevers or cough at home.  Patient having trouble finding some words.  EMS vitals 163 CBG, 160/68 BP.

## 2017-02-03 NOTE — ED Provider Notes (Signed)
[facility] Emergency Department Provider Note        Time seen: ----------------------------------------- 12:45 PM on 02/03/2017 -----------------------------------------    I have reviewed the triage vital signs and the nursing notes.   HISTORY  Chief Complaint No chief complaint on file.    HPI Joseph [name] is a 83 y.o. male who presents to the ER with headache and memory trouble. Patient reports he went to bed last night with a headache and woke up with a headache. Pain is in the frontal part of his scalp. Patient states he also has not been able to remember the names of things. He states this is new for him, he has generalized weakness nothing focal. He denies fevers, chills, chest pain, shortness of breath, vomiting or other complaints.   Past Medical History:  Diagnosis Date  . CAD (coronary artery disease)    a. s/p CABG;  b. 07/2012 Cath: 3VD including 80 dLCX (med Rx), 4/4 patent grafts.  . Chronic diastolic CHF (congestive heart failure) ([place])    a. 07/2012 Echo: EF 55-60%, no rwma, Gr 1 DD, mild MR;  04/2014 Echo: EF 55-60%, no rwma, mild MR, mildly dil LA.  . Diabetes mellitus, type 2 ([place])   . HTN (hypertension)   . Hyperlipidemia   . Hypothyroidism   . Lower extremity edema    a. 03/2014 amlodipine d/c'd.  Marland Kitchen Spinal stenosis     Patient Active Problem List   Diagnosis Date Noted  . Acute cholecystitis   . Abdominal pain   . Calculus of gallbladder with acute cholecystitis without obstruction   . Diverticulitis of large intestine without perforation or abscess without bleeding   . Pain of upper abdomen   . SOB (shortness of breath)   . S/P CABG (coronary artery bypass graft)   . Coronary artery disease involving coronary bypass graft of native heart with unspecified angina pectoris   . Morbid obesity due to excess calories ([place])   . Acute respiratory failure with hypoxia ([place]) 03/21/2016  . Back pain, chronic 07/28/2015  .  Benign fibroma of prostate 07/28/2015  . Acid reflux 07/28/2015  . Adult hypothyroidism 07/28/2015  . Eunuchoidism 07/28/2015  . Neuropathy ([place] 07/28/2015  . Arthritis, degenerative 07/28/2015  . Psoriasis 07/28/2015  . Obesity 09/09/2014  . Bilateral leg weakness 09/09/2014  . Diastolic CHF, acute on chronic ([place]) 05/13/2014  . Bilateral leg edema 04/15/2014  . Bradycardia 08/13/2013  . Edema 06/21/2012  . Fatigue 09/15/2011  . Diabetes mellitus with peripheral vascular disease ([place]) 09/15/2011  . DYSPNEA 06/09/2009  . Hyperlipidemia 06/07/2009  . HYPERTENSION, BENIGN 06/07/2009  . CAD, ARTERY BYPASS GRAFT 06/07/2009    Past Surgical History:  Procedure Laterality Date  . APPENDECTOMY    . CARDIAC CATHETERIZATION  07/2012   [facility]/no stents  . CHOLECYSTECTOMY    . CORNEAL TRANSPLANT    . CORONARY ARTERY BYPASS GRAFT  1994  . HEMORRHOID SURGERY    . PROSTATE BIOPSY    . TOE SURGERY     ingrown toe nail    Allergies Patient has no known allergies.  Social History Social History  Substance Use Topics  . Smoking status: Former Smoker    Packs/day: 0.00    Years: 6.00    Types: Pipe, Cigars    Quit date: 12/23/1980  . Smokeless tobacco: Never Used  . Alcohol use No    Review of Systems Constitutional: Negative for fever. Cardiovascular: Negative for chest pain. Respiratory: Negative for shortness  of breath. Gastrointestinal: Negative for abdominal pain, vomiting and diarrhea. Genitourinary: Negative for dysuria. Musculoskeletal: Negative for back pain. Skin: Negative for rash. Neurological: Positive for headache, memory disturbance  10-point ROS otherwise negative.  ____________________________________________   PHYSICAL EXAM:  VITAL SIGNS: ED Triage Vitals  Enc Vitals Group     BP      Pulse      Resp      Temp      Temp src      SpO2      Weight      Height      Head Circumference      Peak Flow      Pain Score      Pain Loc      Pain Edu?       Excl. in [place]?     Constitutional: Alert and oriented. Well appearing and in no distress. Eyes: Conjunctivae are normal. PERRL. Normal extraocular movements. ENT   Head: Normocephalic and atraumatic.   Nose: No congestion/rhinnorhea.   Mouth/Throat: Mucous membranes are moist.   Neck: No stridor. Cardiovascular: Normal rate, regular rhythm. No murmurs, rubs, or gallops. Respiratory: Normal respiratory effort without tachypnea nor retractions. Breath sounds are clear and equal bilaterally. No wheezes/rales/rhonchi. Gastrointestinal: Soft and nontender. Normal bowel sounds Musculoskeletal: Nontender with normal range of motion in all extremities. No lower extremity tenderness nor edema. Neurologic:  Normal speech and language. No gross focal neurologic deficits are appreciated.  Skin:  Skin is warm, dry and intact. No rash noted. Psychiatric: Mood and affect are normal. Speech and behavior are normal.  ____________________________________________  EKG: Interpreted by me. Sinus bradycardia with a rate of 56 bpm, normal PR interval, wide QRS, normal QT, left anterior fascicular block, LVH with repolarization abnormality  ____________________________________________  ED COURSE:  Pertinent labs & imaging results that were available during my care of the patient were reviewed by me and considered in my medical decision making (see chart for details). Patient presents to ER with headache and memory disturbance. We will assess with labs and CT imaging. Clinical Course as of Feb 04 1552  Fri Feb 03, 2017  1413 Patient states he does not want to be admitted and wants to go home.  [JW]    Clinical Course User Index [JW] Earleen Newport, MD   Procedures ____________________________________________   LABS (pertinent positives/negatives)  Labs Reviewed  CBC WITH DIFFERENTIAL/PLATELET - Abnormal; Notable for the following:       Result Value   RBC 4.31 (*)    HCT 39.2  (*)    All other components within normal limits  COMPREHENSIVE METABOLIC PANEL - Abnormal; Notable for the following:    Sodium 134 (*)    Glucose, Bld 107 (*)    Calcium 8.5 (*)    Albumin 3.2 (*)    All other components within normal limits  URINALYSIS, COMPLETE (UACMP) WITH MICROSCOPIC - Abnormal; Notable for the following:    Color, Urine YELLOW (*)    APPearance CLEAR (*)    Protein, ur 100 (*)    Squamous Epithelial / LPF 0-5 (*)    All other components within normal limits  TROPONIN I  CBG MONITORING, ED    RADIOLOGY Images were viewed by me  CT head IMPRESSION: 1. No acute intracranial findings. 2. There is atherosclerotic calcification of the cavernous carotid arteries bilaterally. 3. Minimal chronic left ethmoid sinusitis. ____________________________________________  FINAL ASSESSMENT AND PLAN  Headache, memory disturbance  Plan: Patient with labs  and imaging as dictated above. No clear etiology for his symptoms. Currently he is neuro intact and his NIH stroke scale of 0. He is currently taking a baby aspirin and I'll encourage same. I have encouraged him to be hospitalized but he has declined. He is encouraged to see his doctor as soon as possible for recheck. Currently headache is gone.   Earleen Newport, MD   Note: This note was generated in part or whole with voice recognition software. Voice recognition is usually quite accurate but there are transcription errors that can and very often do occur. I apologize for any typographical errors that were not detected and corrected.     Earleen Newport, MD 02/03/17 [phone]

## 2017-02-03 NOTE — ED Notes (Signed)
Patient ambulated down hall per MD request without difficulty.

## 2017-02-03 NOTE — ED Notes (Signed)
Patient transported to CT 

## 2017-02-06 ENCOUNTER — Telehealth: Payer: Self-pay

## 2017-02-06 NOTE — Telephone Encounter (Signed)
Patient scheduled Wednesday afternoon.

## 2017-02-06 NOTE — Telephone Encounter (Signed)
Spoke to the patient. Advised him of below. patient refuses to see any other doctor. Appointment made for 02/08/17. Pt advised if he starts to feel bad or need to be seen he needs to call and see another provider if Dr Rosanna Randy can not. Patient understood and states he feels better now-aa

## 2017-02-06 NOTE — Telephone Encounter (Signed)
Patient calling that want to come in to see Dr. Rosanna Randy today for headache.He reports his headache is mild today. Patient was seen at the ED on 03/16 for a headache and having memory problem-per patient he was having slurred speech and thought that he was having a stroke. He reports he is feeling better from when he went to the ED and is drinking a lot of fluids. He denies SOB, chest pain or any discomfort, discomfort in either arm, neck, jaw,lightheadedness, weakness or numbness in face,trouble speaking, confusion, or dizziness. Only a slight headache and he wants to see the Dr. Today. Patient BP this morning is 182/68.  Patient was offered and appointment Wednesday but didn't take it.  Please Review.

## 2017-02-06 NOTE — Telephone Encounter (Signed)
Ok to work him in today?-aa

## 2017-02-06 NOTE — Telephone Encounter (Signed)
I have no appointments available. Almira Coaster, and Adriana all have appointments. I am available to consult if they need me.

## 2017-02-08 ENCOUNTER — Ambulatory Visit (INDEPENDENT_AMBULATORY_CARE_PROVIDER_SITE_OTHER): Payer: PPO | Admitting: Family Medicine

## 2017-02-08 ENCOUNTER — Encounter: Payer: Self-pay | Admitting: Family Medicine

## 2017-02-08 VITALS — BP 184/70 | HR 60 | Temp 97.8°F | Resp 16 | Wt 203.0 lb

## 2017-02-08 DIAGNOSIS — I10 Essential (primary) hypertension: Secondary | ICD-10-CM

## 2017-02-08 DIAGNOSIS — R51 Headache: Secondary | ICD-10-CM | POA: Diagnosis not present

## 2017-02-08 DIAGNOSIS — R519 Headache, unspecified: Secondary | ICD-10-CM

## 2017-02-08 MED ORDER — LOSARTAN POTASSIUM 100 MG PO TABS: 100.0000 mg | ORAL_TABLET | Freq: Every day | ORAL | 3 refills | Status: DC

## 2017-02-08 MED ORDER — AMLODIPINE BESYLATE 2.5 MG PO TABS: 2.5000 mg | ORAL_TABLET | Freq: Every day | ORAL | 3 refills | Status: DC

## 2017-02-08 NOTE — Progress Notes (Signed)
Patient: Joseph [name] Male    DOB: February 17, 1925   83 y.o.   MRN: [id number] Visit Date: 02/08/2017  Today's Provider: Wilhemena Durie, MD   Chief Complaint  Patient presents with  . ER Follow Up   Subjective:    HPI     Follow up ER visit  Patient was seen in ER for headache and memory loss on 02/03/2017. He was treated for headache and memory disturbance. Treatment for this included advising hospitalization, but pt declined. Head CT was negative. Stroke scale was 0.  He reports this condition is Improved. Headache is currently a 4/10, where it was an 8-9/10 prior to ER visit. Pt "disorientation" is improved.  ------------------------------------------------------------------------------------ Hypertension Pt's BP is elevated today. 184/70 right arm, 170/78 left arm. Pt is taking Losartan 50 mg this problem. Pt does have amlodipine 2.5 mg on his medications list, but pt reports he is not taking this. He believes the amlodipine was D/C by either Dr. Rockey Situ or Dr. Rosanna Randy.   No Known Allergies   Current Outpatient Prescriptions:  .  aspirin EC 81 MG tablet, Take 1 tablet (81 mg total) by mouth daily., Disp: 90 tablet, Rfl: 3 .  atorvastatin (LIPITOR) 40 MG tablet, Take 1 tablet (40 mg total) by mouth daily., Disp: 90 tablet, Rfl: 3 .  blood glucose meter kit and supplies KIT, Dispense Accuchek Aviva glucometer with 120 test strips.  Test glucose level 3 times per day with meals and at bedtime.  Dispense based on patient and insurance preference. Use up to four times daily as directed. (FOR ICD-9 250.00, 250.01, ICD 10 E11.9)., Disp: 1 each, Rfl: 0 .  calcipotriene (DOVONOX) 0.005 % cream, Apply topically 2 (two) times daily., Disp: , Rfl:  .  Cholecalciferol (VITAMIN D3) 2000 units TABS, Take 1 capsule by mouth daily., Disp: , Rfl:  .  clobetasol cream (TEMOVATE) 4.38 %, Apply 1 application topically 2 (two) times daily., Disp: , Rfl:  .  glucose blood (ONE TOUCH  ULTRA TEST) test strip, Check sugar 4 times daily DX:E 11.51, Disp: 100 each, Rfl: 12 .  hydrocortisone 2.5 % cream, Apply topically 2 (two) times daily., Disp: , Rfl:  .  insulin aspart (NOVOLOG) 100 UNIT/ML injection, Inject 0-9 Units into the skin 3 (three) times daily with meals. (Patient taking differently: Inject 0-9 Units into the skin as needed. 26 am, 16 lunch, 28 pm), Disp: 10 mL, Rfl: 0 .  insulin glargine (LANTUS) 100 UNIT/ML injection, Inject 0.68 mLs (68 Units total) into the skin at bedtime. (Patient taking differently: Inject 55 Units into the skin at bedtime. 16 am and 65 at bedtime), Disp: 10 mL, Rfl: 0 .  ketoconazole (NIZORAL) 2 % cream, Apply 1 application topically 2 (two) times daily as needed for irritation., Disp: , Rfl:  .  levothyroxine (SYNTHROID, LEVOTHROID) 50 MCG tablet, Take 1 tablet (50 mcg total) by mouth daily before breakfast., Disp: 30 tablet, Rfl: 12 .  losartan (COZAAR) 50 MG tablet, Take 1 tablet (50 mg total) by mouth daily., Disp: 90 tablet, Rfl: 3 .  Magnesium 250 MG TABS, Take by mouth daily as needed., Disp: , Rfl:  .  mometasone (ELOCON) 0.1 % ointment, Apply topically daily., Disp: , Rfl:  .  omeprazole (PRILOSEC) 20 MG capsule, Take 20 mg by mouth daily., Disp: , Rfl:  .  ONETOUCH DELICA LANCETS 38F MISC, Check sugar 4 times daily. DX E11.51, Disp: 100 each, Rfl: 12 .  potassium chloride (K-DUR,KLOR-CON) 10 MEQ tablet, Take 10 mEq by mouth daily as needed., Disp: , Rfl:  .  torsemide (DEMADEX) 20 MG tablet, Take 20 mg by mouth daily as needed., Disp: , Rfl:  .  vitamin C (ASCORBIC ACID) 500 MG tablet, Take 500 mg by mouth daily., Disp: , Rfl:  .  amLODipine (NORVASC) 2.5 MG tablet, Take 1 tablet (2.5 mg total) by mouth daily. (Patient not taking: Reported on 12/23/2016), Disp: 30 tablet, Rfl: 5 .  nitroGLYCERIN (NITROSTAT) 0.4 MG SL tablet, Place 1 tablet (0.4 mg total) under the tongue every 5 (five) minutes as needed for chest pain. (Patient not taking:  Reported on 02/08/2017), Disp: 25 tablet, Rfl: 3  Review of Systems  Constitutional: Negative for activity change, appetite change, chills, diaphoresis, fatigue, fever and unexpected weight change.  HENT: Negative.   Eyes: Negative.   Respiratory: Negative.   Cardiovascular: Negative for chest pain, palpitations and leg swelling.  Gastrointestinal: Negative.   Endocrine: Negative.   Allergic/Immunologic: Negative.   Neurological: Positive for headaches. Negative for facial asymmetry, speech difficulty and weakness.  Hematological: Negative.   Psychiatric/Behavioral: Negative.  Negative for confusion.    Social History  Substance Use Topics  . Smoking status: Former Smoker    Packs/day: 0.00    Years: 6.00    Types: Pipe, Cigars    Quit date: 12/23/1980  . Smokeless tobacco: Never Used  . Alcohol use No   Objective:   BP (!) 184/70 (BP Location: Right Arm, Patient Position: Sitting, Cuff Size: Large)   Pulse 60   Temp 97.8 F (36.6 C) (Oral)   Resp 16   Wt 203 lb (92.1 kg)   BMI 31.79 kg/m  Vitals:   02/08/17 1602  BP: (!) 184/70  Pulse: 60  Resp: 16  Temp: 97.8 F (36.6 C)  TempSrc: Oral  Weight: 203 lb (92.1 kg)     Physical Exam  Constitutional: He is oriented to person, place, and time. He appears well-developed and well-nourished.  HENT:  Head: Normocephalic and atraumatic.  Right Ear: External ear normal.  Left Ear: External ear normal.  Nose: Nose normal.  Eyes: No scleral icterus.  Cardiovascular: Normal rate, regular rhythm and normal heart sounds.   Pulmonary/Chest: Effort normal and breath sounds normal. No respiratory distress.  Musculoskeletal: He exhibits no edema.  Neurological: He is alert and oriented to person, place, and time. He has normal reflexes.  Grossly nonfocal.  Skin: Skin is warm and dry.  Psychiatric: He has a normal mood and affect. His behavior is normal. Judgment and thought content normal.        Assessment & Plan:       1. HYPERTENSION, BENIGN Not to goal. Restart amlodipine as below. Advised pt this can cause some swelling. Increase losartan to 100 mg. Recheck 4 weeks. - amLODipine (NORVASC) 2.5 MG tablet; Take 1 tablet (2.5 mg total) by mouth daily.  Dispense: 90 tablet; Refill: 3 - losartan (COZAAR) 100 MG tablet; Take 1 tablet (100 mg total) by mouth daily.  Dispense: 90 tablet; Refill: 3  2. Acute nonintractable headache, unspecified headache type Unclear etiology. Question HTN cause vs TIA. Other sx improving. Pt reports he is still feeling "sluggish". Will make changes to HTN medications as above. Hopefully pt will feel better. Recheck 4 weeks.   3.CAD/s/p CABG times 8 Patient seen and examined by Miguel Aschoff, MD, and note scribed by Renaldo Fiddler, CMA. I have done the exam and reviewed the above chart  and it is accurate to the best of my knowledge. Development worker, community has been used in this note in any air is in the dictation or transcription are unintentional.  Wilhemena Durie, MD  [place]

## 2017-02-10 ENCOUNTER — Telehealth: Payer: Self-pay | Admitting: Cardiovascular Disease

## 2017-02-10 NOTE — Telephone Encounter (Signed)
Called patient and he states that he had a TIA and Dr. Rosanna Randy made some medication changes yesterday to help with his blood pressure. Instructed him to continue monitoring his blood pressure and keep a log of those readings. He states that he feels good today and not having any symptoms at this time. He wanted to come in and see Dr. Rockey Situ to see if there is something we can do for him. Had him take his blood pressure while he was on the phone with me and it was 184/68 and he took medications about an hour before. Instructed him to continue monitoring his blood pressures and if after an hour if they remain elevated to call back. Scheduled him to come in on 02/24/17 at 10:20AM to see Dr. Rockey Situ and instructed him on symptoms to watch for potential stroke and importance of going to the ED if those happen. He verbalized understanding of our conversation, agreement with plan, and had no further questions.

## 2017-02-10 NOTE — Telephone Encounter (Signed)
Would recommend if blood pressure continues to run more than 150 that he double his amlodipine

## 2017-02-10 NOTE — Telephone Encounter (Signed)
Pt calling stating that last friday he was told by PCP (Dr Rosanna Randy)  that he had a TIA  And was sent to ED they did not seen anything wrong That morning he had a headache and was not able to really talk  He then called PCP and that's where he was told that he may have had a TIA   PCP changed around his BP medication yesterday for it was a bit high yesterday  He doubled the dose, he is now taking  Losartan 100 mg one a day  Along with that he Placed patient on  Amlodipine 2.5 mg 1 a day He just took them a little bit ago.    02/10/17 182/72 about an hour ago  02/09/17 186/69  Last Friday was 199/89 (he thinks)   States he is doing really well right now But would like to know if Dr Rockey Situ need to see him or change anything  Please advise.

## 2017-02-13 NOTE — Telephone Encounter (Signed)
Spoke w/ pt.   Advised him of Dr. Gollan's recommendation.  He is agreeable and will call back w/ any further questions or concerns.  

## 2017-02-23 NOTE — Progress Notes (Signed)
Cardiology Office Note  Date:  02/24/2017   ID:  Joseph [name], DOB 1925-03-15, MRN [id number]  PCP:  Joseph Durie, MD   Chief Complaint  Patient presents with  . other    patient c/o high blood pressure, nose bleeds, headaches and  would like discuss the TIA that Joseph [name] said he had. Meds reviewed verbally with patient.     HPI:  Joseph [name] is a 83 year old gentleman with a hx of  CAD, CABG in 1994,  PAD with reported stenosis in the past requiring intervention (details unavailable),followed by Joseph [name],  obesity,  hyperlipidemia,  arthritis of the knees,   fatigue, SOB, leg weakness,  lower extremity edema, presenting for routine followup of his coronary artery disease and history of acute on chronic diastolic CHF. He is a patient of Joseph [name].  In follow-up today he presents with family They're concerned about recent headache, concerned he may have had a TIA Seen in th ER for H/A 02/03/17 Confused,  In the emergency room had CT head, Showing atherosclerotic calcification of the cavernous carotid arteries bilaterally. Lab work within normal limits No other focal neurologic deficits  At home no confusion, been having headaches afternoon Reports having arthritis in his neck  Baseline weight typically 193 up to 195 Weight today 198 at home In the office is 205. Prior office visit was 200 Does not take torsemide, only for leg swelling (He does have 1+ pitting edema today but does not realize it)  Lab work reviewed with him HBA1C 7.0 Total chol 138, LDL 77  Denies any chest pain concerning for angina  Was told by orthopedics he had Spinal stenosis? Recommended to have cortisone injections Legs get "heavy" with walking, sits 2 minutes  Does not feel that he needs injections  EKG Personally reviewed by myself on today's visit Shows sinus bradycardia rate 48 bpm left anterior fascicular block, intraventricular conduction delay, no change from previous  EKG 1 year ago  Other past medical history admission May 2017 for acute cholecystitis, developing acute respiratory distress with hypoxia and acute renal disease, peak creatinine of 2. Notes indicating HIDA scan positive for acute cholecystitis. s/p cholecystostomy tube placed in through interventional radiology 03/24/16 Diuretic was discontinued in the setting of acute renal failure He does report greater than 20 pound weight loss  Echocardiogram 03/22/2016 reviewed with him, ejection fraction 50-55% Chest x-ray 03/29/2016 with small right pleural effusion  Ultrasound of the legs showing patent right SFA stent, left side CFA and popliteal artery with good flow, waveform deterioration around the ankle on the left  Last Cardiac catheterization was performed at [facility] 08/16/2012 Catheterization showed severe three-vessel coronary artery disease with patent grafts x4. There was 80% disease of a small to moderate sized distal left circumflex. Ejection fraction 45-50%. No significant aortic valve stenosis or mitral valve regurgitation. The findings were discussed with interventional cardiology and medical management was recommended for the distal left circumflex disease.  previous Echocardiogram did not show elevated right-sided pressures and ejection fraction was normal. Previous episode of tachycardia in June 2013 that resolved without intervention  Total cholesterol 143, LDL 82  PMH:   has a past medical history of CAD (coronary artery disease); Chronic diastolic CHF (congestive heart failure) ([place]); Diabetes mellitus, type 2 ([place]); HTN (hypertension); Hyperlipidemia; Hypothyroidism; Lower extremity edema; and Spinal stenosis.  PSH:    Past Surgical History:  Procedure Laterality Date  . APPENDECTOMY    . CARDIAC CATHETERIZATION  07/2012   [facility]/no  stents  . CHOLECYSTECTOMY    . CORNEAL TRANSPLANT    . CORONARY ARTERY BYPASS GRAFT  1994  . HEMORRHOID SURGERY    . PROSTATE BIOPSY     . TOE SURGERY     ingrown toe nail    Current Outpatient Prescriptions  Medication Sig Dispense Refill  . amLODipine (NORVASC) 5 MG tablet Take 1 tablet (5 mg total) by mouth daily. 90 tablet 3  . aspirin EC 81 MG tablet Take 1 tablet (81 mg total) by mouth daily. 90 tablet 3  . atorvastatin (LIPITOR) 40 MG tablet Take 1 tablet (40 mg total) by mouth daily. 90 tablet 3  . blood glucose meter kit and supplies KIT Dispense Accuchek Aviva glucometer with 120 test strips.  Test glucose level 3 times per day with meals and at bedtime.  Dispense based on patient and insurance preference. Use up to four times daily as directed. (FOR ICD-9 250.00, 250.01, ICD 10 E11.9). 1 each 0  . calcipotriene (DOVONOX) 0.005 % cream Apply topically 2 (two) times daily.    . Cholecalciferol (VITAMIN D3) 2000 units TABS Take 1 capsule by mouth daily.    . clobetasol cream (TEMOVATE) 2.83 % Apply 1 application topically 2 (two) times daily.    Marland Kitchen glucose blood (ONE TOUCH ULTRA TEST) test strip Check sugar 4 times daily DX:E 11.51 100 each 12  . hydrocortisone 2.5 % cream Apply topically 2 (two) times daily.    . insulin aspart (NOVOLOG) 100 UNIT/ML injection Inject 0-9 Units into the skin 3 (three) times daily with meals. (Patient taking differently: Inject 0-9 Units into the skin as needed. 26 am, 16 lunch, 28 pm) 10 mL 0  . insulin glargine (LANTUS) 100 UNIT/ML injection Inject 0.68 mLs (68 Units total) into the skin at bedtime. (Patient taking differently: Inject 55 Units into the skin at bedtime. 16 am and 65 at bedtime) 10 mL 0  . ketoconazole (NIZORAL) 2 % cream Apply 1 application topically 2 (two) times daily as needed for irritation.    Marland Kitchen levothyroxine (SYNTHROID, LEVOTHROID) 50 MCG tablet Take 1 tablet (50 mcg total) by mouth daily before breakfast. 30 tablet 12  . losartan (COZAAR) 100 MG tablet Take 1 tablet (100 mg total) by mouth daily. 90 tablet 3  . Magnesium 250 MG TABS Take by mouth daily as needed.     . mometasone (ELOCON) 0.1 % ointment Apply topically daily.    . nitroGLYCERIN (NITROSTAT) 0.4 MG SL tablet Place 1 tablet (0.4 mg total) under the tongue every 5 (five) minutes as needed for chest pain. 25 tablet 3  . omeprazole (PRILOSEC) 20 MG capsule Take 20 mg by mouth daily.    Glory Rosebush DELICA LANCETS 15V MISC Check sugar 4 times daily. DX E11.51 100 each 12  . potassium chloride (K-DUR,KLOR-CON) 10 MEQ tablet Take 10 mEq by mouth daily as needed.    . torsemide (DEMADEX) 20 MG tablet Take 20 mg by mouth daily as needed.    . vitamin C (ASCORBIC ACID) 500 MG tablet Take 500 mg by mouth daily.    . isosorbide mononitrate (IMDUR) 30 MG 24 hr tablet Take 1 tablet (30 mg total) by mouth daily. 30 tablet 6   No current facility-administered medications for this visit.      Allergies:   Patient has no known allergies.   Social History:  The patient  reports that he quit smoking about 36 years ago. His smoking use included Pipe and Cigars. He  smoked 0.00 packs per day for 6.00 years. He has never used smokeless tobacco. He reports that he does not drink alcohol or use drugs.   Family History:   family history includes Heart attack in his father; Heart failure in his maternal grandfather, maternal uncle, and mother; Stroke in his brother.    Review of Systems: Review of Systems  Constitutional: Negative.        Previous episode of headache with confusion, confusion now resolved  Respiratory: Negative.   Cardiovascular: Negative.   Gastrointestinal: Negative.   Musculoskeletal: Negative.   Neurological: Positive for headaches.  Psychiatric/Behavioral: Negative.   All other systems reviewed and are negative.    PHYSICAL EXAM: VS:  BP (!) 160/52 (BP Location: Left Arm, Patient Position: Sitting, Cuff Size: Normal)   Pulse (!) 48   Ht 5' 6"  (1.676 m)   Wt 205 lb 8 oz (93.2 kg)   BMI 33.17 kg/m  , BMI Body mass index is 33.17 kg/m. GEN: Well nourished, well developed, in no  acute distress,  obese HEENT: normal  Neck: no JVD, carotid bruits, or masses Cardiac: RRR; no murmurs, rubs, or gallops,no edema  Respiratory:  clear to auscultation bilaterally, normal work of breathing GI: soft, nontender, nondistended, + BS MS: no deformity or atrophy  Skin: warm and dry, no rash Neuro:  Strength and sensation are intact Psych: euthymic mood, full affect    Recent Labs: 03/26/2016: Magnesium 2.5 06/16/2016: TSH 1.630 02/03/2017: ALT 18; BUN 14; Creatinine, Ser 0.73; Hemoglobin 13.6; Platelets 180; Potassium 4.1; Sodium 134    Lipid Panel Lab Results  Component Value Date   CHOL 138 12/06/2016   HDL 44 12/06/2016   LDLCALC 77 12/06/2016   TRIG 85 12/06/2016      Wt Readings from Last 3 Encounters:  02/24/17 205 lb 8 oz (93.2 kg)  02/08/17 203 lb (92.1 kg)  02/03/17 200 lb (90.7 kg)       ASSESSMENT AND PLAN:  Mixed hyperlipidemia -  LDL slightly above goal, we'll continue current regimen for now  HYPERTENSION, BENIGN -  Recommended he stay on amlodipine 5 mg daily We will add isosorbide 30 mg daily Also We will add torsemide 3 days per week  Atherosclerosis of coronary artery bypass graft of native heart with stable angina pectoris (HCC) -  Currently with no symptoms of angina. No further workup at this time. Continue current medication regimen.  Diabetes mellitus with peripheral vascular disease ([place]) -  We have encouraged continued exercise, careful diet management in an effort to lose weight.  Diastolic CHF, acute on chronic (HCC) -  Torsemide 3 days per week with potassium Recommended he moderate his fluid intake. he reports that he drinks lots of water daily.   SOB (shortness of breath) -  Symptoms likely exacerbated by obesity, deconditioning, sedentary lifestyle, Possible chronic diastolic CHF We'll start the torsemide 3 times a week  Morbid obesity due to excess calories (HCC) -   Bilateral leg edema -  Weight is up at least 5  pounds. Recommended he take torsemide with potassium every other day on Monday Wednesday Friday. Basic metabolic panel in 3 weeks' time Unable to exclude side effect of the amlodipine  Nonintractable headache, unspecified chronicity pattern, unspecified headache type - Recent hospital records reviewed from the emergency room, long discussion with patient and family concerning onset of symptoms Etiology of his headache is unclear Family concerned about TIA as he was confused They are requesting MRI of the brain Recommended  family talk with Joseph [name] about further neurologic testing. He does have chronic headaches in the afternoon.If symptoms recur, may need follow-up with neurology If there is concern of TIA, we could add Plavix to his aspirin Carotid ultrasound has been ordered  PAD (peripheral artery disease) ([place]) -  Stressed importance of aggressive diabetes control, low cholesterol   Total encounter time more than 45 minutes  Greater than 50% was spent in counseling and coordination of care with the patient   Disposition:   F/U  6 months   Orders Placed This Encounter  Procedures  . Basic Metabolic Panel (BMET)  . EKG 12-Lead     Signed, Esmond Plants, M.D., Ph.D. 02/24/2017  [place], [place]

## 2017-02-24 ENCOUNTER — Encounter: Payer: Self-pay | Admitting: Cardiovascular Disease

## 2017-02-24 ENCOUNTER — Ambulatory Visit (INDEPENDENT_AMBULATORY_CARE_PROVIDER_SITE_OTHER): Payer: PPO | Admitting: Cardiovascular Disease

## 2017-02-24 ENCOUNTER — Other Ambulatory Visit: Payer: Self-pay | Admitting: Cardiovascular Disease

## 2017-02-24 VITALS — BP 160/52 | HR 48 | Ht 66.0 in | Wt 205.5 lb

## 2017-02-24 DIAGNOSIS — R51 Headache: Secondary | ICD-10-CM | POA: Diagnosis not present

## 2017-02-24 DIAGNOSIS — I5033 Acute on chronic diastolic (congestive) heart failure: Secondary | ICD-10-CM | POA: Diagnosis not present

## 2017-02-24 DIAGNOSIS — R600 Localized edema: Secondary | ICD-10-CM

## 2017-02-24 DIAGNOSIS — I25708 Atherosclerosis of coronary artery bypass graft(s), unspecified, with other forms of angina pectoris: Secondary | ICD-10-CM | POA: Diagnosis not present

## 2017-02-24 DIAGNOSIS — E782 Mixed hyperlipidemia: Secondary | ICD-10-CM

## 2017-02-24 DIAGNOSIS — R519 Headache, unspecified: Secondary | ICD-10-CM

## 2017-02-24 DIAGNOSIS — I10 Essential (primary) hypertension: Secondary | ICD-10-CM

## 2017-02-24 DIAGNOSIS — J9601 Acute respiratory failure with hypoxia: Secondary | ICD-10-CM

## 2017-02-24 DIAGNOSIS — R0602 Shortness of breath: Secondary | ICD-10-CM | POA: Diagnosis not present

## 2017-02-24 DIAGNOSIS — E1151 Type 2 diabetes mellitus with diabetic peripheral angiopathy without gangrene: Secondary | ICD-10-CM

## 2017-02-24 DIAGNOSIS — I739 Peripheral vascular disease, unspecified: Secondary | ICD-10-CM | POA: Diagnosis not present

## 2017-02-24 DIAGNOSIS — I6523 Occlusion and stenosis of bilateral carotid arteries: Secondary | ICD-10-CM

## 2017-02-24 DIAGNOSIS — G459 Transient cerebral ischemic attack, unspecified: Secondary | ICD-10-CM

## 2017-02-24 MED ORDER — ISOSORBIDE MONONITRATE ER 30 MG PO TB24: 30.0000 mg | ORAL_TABLET | Freq: Every day | ORAL | 6 refills | Status: DC

## 2017-02-24 MED ORDER — AMLODIPINE BESYLATE 5 MG PO TABS: 5.0000 mg | ORAL_TABLET | Freq: Every day | ORAL | 3 refills | Status: DC

## 2017-02-24 NOTE — Patient Instructions (Addendum)
Medication Instructions:   Please take torsemide and potassium three days a week, Mon/Wed/Friday  Start isosorbide one a day for blood pressure  Labwork:  BMP in 3 weeks  Testing/Procedures:  Carotid u/s for headache, PAD   I recommend watching educational videos on topics of interest to you at:       [email or web address].goemmi.com  Enter code: HEARTCARE    Follow-Up: It was a pleasure seeing you in the office today. Please call us if you have new issues that need to be addressed before your next appt.  [phone]  Your physician wants you to follow-up in: 1 month.   If you need a refill on your cardiac medications before your next appointment, please call your pharmacy.

## 2017-03-02 ENCOUNTER — Ambulatory Visit (INDEPENDENT_AMBULATORY_CARE_PROVIDER_SITE_OTHER): Payer: PPO | Admitting: Family Medicine

## 2017-03-02 ENCOUNTER — Telehealth: Payer: Self-pay | Admitting: Cardiovascular Disease

## 2017-03-02 ENCOUNTER — Encounter: Payer: Self-pay | Admitting: Family Medicine

## 2017-03-02 VITALS — BP 130/60 | HR 68 | Temp 97.5°F | Resp 16 | Wt 204.0 lb

## 2017-03-02 DIAGNOSIS — I10 Essential (primary) hypertension: Secondary | ICD-10-CM

## 2017-03-02 DIAGNOSIS — R51 Headache: Secondary | ICD-10-CM | POA: Diagnosis not present

## 2017-03-02 DIAGNOSIS — R519 Headache, unspecified: Secondary | ICD-10-CM

## 2017-03-02 NOTE — Telephone Encounter (Signed)
Patient was seen 4/6 by Gollan and c/o headaches.  Per Dr. Rosanna Randy patient headaches are resolved and he wants to know if gollan wants to continue with plans for MRA    Please call Dr. Alfredo Batty tomorrow

## 2017-03-02 NOTE — Progress Notes (Signed)
Patient: Joseph [name] Male    DOB: June 15, 1925   83 y.o.   MRN: [id number] Visit Date: 03/02/2017  Today's Provider: Wilhemena Durie, MD   Chief Complaint  Patient presents with  . Hypertension  . Headache   Subjective:    HPI      Hypertension, follow-up:  BP Readings from Last 3 Encounters:  03/02/17 130/60  02/24/17 (!) 160/52  02/08/17 (!) 184/70    He was last seen for hypertension 3 weeks ago.  BP at that visit was 184/70. Pt saw Dr. Rockey Situ on 02/24/2017, and Dr. Rockey Situ changed pt's Amlodipine to 5 mg, added isosorbide 30 mg daily, as well as torsemide 3 days per week. Management since that visit includes restarting Amlodipine 2.5 mg and increasing Losartan to 100 mg. He reports excellent compliance with treatment. He is not having side effects.  He is exercising. 2 days per week at [facility]. He is adherent to low salt diet.   Outside blood pressures are 140's-160's/50's-70's. Pt's HR has been as low as 48. He is experiencing irregular heart beat and lower extremity edema.  Patient denies chest pain, chest pressure/discomfort, claudication, dyspnea, exertional chest pressure/discomfort, fatigue, near-syncope, orthopnea, palpitations and syncope.   Cardiovascular risk factors include advanced age (older than 99 for men, 99 for women), dyslipidemia, hypertension, male gender and obesity (BMI >= 30 kg/m2).      Weight trend: stable Wt Readings from Last 3 Encounters:  03/02/17 204 lb (92.5 kg)  02/24/17 205 lb 8 oz (93.2 kg)  02/08/17 203 lb (92.1 kg)   ------------------------------------------------------------------------  Follow up for Headaches  The patient was last seen for this 3 weeks ago. Changes made at last visit include restarting BP medications.  He reports excellent compliance with treatment. He feels that condition is Improved. Pt reports he has not had a headache in about 1  week.  ------------------------------------------------------------------------------------    No Known Allergies   Current Outpatient Prescriptions:  .  amLODipine (NORVASC) 5 MG tablet, Take 1 tablet (5 mg total) by mouth daily., Disp: 90 tablet, Rfl: 3 .  aspirin EC 81 MG tablet, Take 1 tablet (81 mg total) by mouth daily., Disp: 90 tablet, Rfl: 3 .  atorvastatin (LIPITOR) 40 MG tablet, Take 1 tablet (40 mg total) by mouth daily., Disp: 90 tablet, Rfl: 3 .  blood glucose meter kit and supplies KIT, Dispense Accuchek Aviva glucometer with 120 test strips.  Test glucose level 3 times per day with meals and at bedtime.  Dispense based on patient and insurance preference. Use up to four times daily as directed. (FOR ICD-9 250.00, 250.01, ICD 10 E11.9)., Disp: 1 each, Rfl: 0 .  calcipotriene (DOVONOX) 0.005 % cream, Apply topically 2 (two) times daily., Disp: , Rfl:  .  Cholecalciferol (VITAMIN D3) 2000 units TABS, Take 1 capsule by mouth daily., Disp: , Rfl:  .  clobetasol cream (TEMOVATE) 9.93 %, Apply 1 application topically 2 (two) times daily., Disp: , Rfl:  .  glucose blood (ONE TOUCH ULTRA TEST) test strip, Check sugar 4 times daily DX:E 11.51, Disp: 100 each, Rfl: 12 .  hydrocortisone 2.5 % cream, Apply topically 2 (two) times daily., Disp: , Rfl:  .  insulin aspart (NOVOLOG) 100 UNIT/ML injection, Inject 0-9 Units into the skin 3 (three) times daily with meals. (Patient taking differently: Inject 0-9 Units into the skin as needed. 26 am, 16 lunch, 28 pm), Disp: 10 mL, Rfl: 0 .  insulin glargine (  LANTUS) 100 UNIT/ML injection, Inject 0.68 mLs (68 Units total) into the skin at bedtime. (Patient taking differently: Inject 55 Units into the skin at bedtime. 16 am and 65 at bedtime), Disp: 10 mL, Rfl: 0 .  isosorbide mononitrate (IMDUR) 30 MG 24 hr tablet, Take 1 tablet (30 mg total) by mouth daily., Disp: 30 tablet, Rfl: 6 .  ketoconazole (NIZORAL) 2 % cream, Apply 1 application topically 2  (two) times daily as needed for irritation., Disp: , Rfl:  .  levothyroxine (SYNTHROID, LEVOTHROID) 50 MCG tablet, Take 1 tablet (50 mcg total) by mouth daily before breakfast., Disp: 30 tablet, Rfl: 12 .  losartan (COZAAR) 100 MG tablet, Take 1 tablet (100 mg total) by mouth daily., Disp: 90 tablet, Rfl: 3 .  Magnesium 250 MG TABS, Take by mouth daily as needed., Disp: , Rfl:  .  mometasone (ELOCON) 0.1 % ointment, Apply topically daily., Disp: , Rfl:  .  nitroGLYCERIN (NITROSTAT) 0.4 MG SL tablet, Place 1 tablet (0.4 mg total) under the tongue every 5 (five) minutes as needed for chest pain., Disp: 25 tablet, Rfl: 3 .  omeprazole (PRILOSEC) 20 MG capsule, Take 20 mg by mouth daily., Disp: , Rfl:  .  ONETOUCH DELICA LANCETS 58N MISC, Check sugar 4 times daily. DX E11.51, Disp: 100 each, Rfl: 12 .  potassium chloride (K-DUR,KLOR-CON) 10 MEQ tablet, Take 10 mEq by mouth daily as needed., Disp: , Rfl:  .  torsemide (DEMADEX) 20 MG tablet, Take 20 mg by mouth daily as needed., Disp: , Rfl:  .  vitamin C (ASCORBIC ACID) 500 MG tablet, Take 500 mg by mouth daily., Disp: , Rfl:   Review of Systems  Constitutional: Negative for activity change, appetite change, chills, diaphoresis, fatigue, fever and unexpected weight change.  HENT: Negative.   Eyes: Negative.   Respiratory: Negative for shortness of breath.   Cardiovascular: Negative for chest pain, palpitations and leg swelling.  Endocrine: Negative.   Skin: Negative.   Allergic/Immunologic: Negative.   Neurological: Negative.  Negative for headaches.  Psychiatric/Behavioral: Negative.     Social History  Substance Use Topics  . Smoking status: Former Smoker    Packs/day: 0.00    Years: 6.00    Types: Pipe, Cigars    Quit date: 12/23/1980  . Smokeless tobacco: Never Used  . Alcohol use No   Objective:   BP 130/60 (BP Location: Right Arm, Patient Position: Sitting, Cuff Size: Large)   Pulse 68   Temp 97.5 F (36.4 C) (Oral)   Resp 16    Wt 204 lb (92.5 kg)   BMI 32.93 kg/m  Vitals:   03/02/17 1546  BP: 130/60  Pulse: 68  Resp: 16  Temp: 97.5 F (36.4 C)  TempSrc: Oral  Weight: 204 lb (92.5 kg)     Physical Exam  Constitutional: He is oriented to person, place, and time. He appears well-developed and well-nourished.  HENT:  Head: Normocephalic and atraumatic.  Right Ear: External ear normal.  Left Ear: External ear normal.  Nose: Nose normal.  Eyes: Conjunctivae are normal.  Neck: Normal range of motion. Neck supple. No thyromegaly present.  Cardiovascular: Normal rate, regular rhythm and normal heart sounds.   No carotid bruit  Pulmonary/Chest: Effort normal and breath sounds normal. No respiratory distress.  Musculoskeletal: He exhibits edema (trace).  Neurological: He is alert and oriented to person, place, and time.  Skin: Skin is warm and dry.  Psychiatric: He has a normal mood and affect. His behavior  is normal. Judgment and thought content normal.        Assessment & Plan:     1. HYPERTENSION, BENIGN Improving. Continue medications. Monitor BP. FU with cardiology as scheduled. Will FU in about 1 month to ensure pt will still be doing well. Pt will also bring in home cuff to ensure calibration.  2. Nonintractable episodic headache, unspecified headache type Resolved. Will call Dr. Rockey Situ to discuss MRA that he wants to order. Am unsure if pt needs this worked up since the headache is resolved. Nor MRI needed. 3.CAD Stable.    Patient seen and examined by Miguel Aschoff, MD, and note scribed by Renaldo Fiddler, CMA. I have done the exam and reviewed the above chart and it is accurate to the best of my knowledge. Development worker, community has been used in this note in any air is in the dictation or transcription are unintentional.  Wilhemena Durie, MD  [place]

## 2017-03-06 NOTE — Telephone Encounter (Signed)
Can we send a message to Dr. Alben Spittle office that I do not think MRI is needed if headache is gone This was something that family had requested, I think it was the daughter She was concerned about TIA as a cause of the headache

## 2017-03-08 ENCOUNTER — Ambulatory Visit: Payer: PPO

## 2017-03-08 DIAGNOSIS — I6523 Occlusion and stenosis of bilateral carotid arteries: Secondary | ICD-10-CM | POA: Diagnosis not present

## 2017-03-08 DIAGNOSIS — G459 Transient cerebral ischemic attack, unspecified: Secondary | ICD-10-CM

## 2017-03-08 LAB — VAS US CAROTID
LCCAPDIAS: 0 cm/s
LEFT ECA DIAS: 0 cm/s
LEFT VERTEBRAL DIAS: -7 cm/s
LICADDIAS: -9 cm/s
LICAPDIAS: -9 cm/s
LICAPSYS: -69 cm/s
Left CCA dist dias: 12 cm/s
Left CCA dist sys: 91 cm/s
Left CCA prox sys: 91 cm/s
Left ICA dist sys: -47 cm/s
RIGHT ECA DIAS: 0 cm/s
RIGHT VERTEBRAL DIAS: -11 cm/s
Right CCA prox dias: 4 cm/s
Right CCA prox sys: 47 cm/s
Right cca dist sys: -58 cm/s

## 2017-03-17 ENCOUNTER — Other Ambulatory Visit (INDEPENDENT_AMBULATORY_CARE_PROVIDER_SITE_OTHER): Payer: PPO

## 2017-03-17 DIAGNOSIS — R51 Headache: Secondary | ICD-10-CM | POA: Diagnosis not present

## 2017-03-17 DIAGNOSIS — R519 Headache, unspecified: Secondary | ICD-10-CM

## 2017-03-17 DIAGNOSIS — I10 Essential (primary) hypertension: Secondary | ICD-10-CM | POA: Diagnosis not present

## 2017-03-17 DIAGNOSIS — I25708 Atherosclerosis of coronary artery bypass graft(s), unspecified, with other forms of angina pectoris: Secondary | ICD-10-CM

## 2017-03-17 DIAGNOSIS — I739 Peripheral vascular disease, unspecified: Secondary | ICD-10-CM | POA: Diagnosis not present

## 2017-03-17 DIAGNOSIS — J9601 Acute respiratory failure with hypoxia: Secondary | ICD-10-CM

## 2017-03-17 DIAGNOSIS — E782 Mixed hyperlipidemia: Secondary | ICD-10-CM

## 2017-03-17 DIAGNOSIS — E1151 Type 2 diabetes mellitus with diabetic peripheral angiopathy without gangrene: Secondary | ICD-10-CM | POA: Diagnosis not present

## 2017-03-17 DIAGNOSIS — R0602 Shortness of breath: Secondary | ICD-10-CM

## 2017-03-17 DIAGNOSIS — R600 Localized edema: Secondary | ICD-10-CM | POA: Diagnosis not present

## 2017-03-17 DIAGNOSIS — I5033 Acute on chronic diastolic (congestive) heart failure: Secondary | ICD-10-CM

## 2017-03-18 LAB — BASIC METABOLIC PANEL
BUN/Creatinine Ratio: 20 (ref 10–24)
BUN: 21 mg/dL (ref 8–27)
CHLORIDE: 101 mmol/L (ref 96–106)
CO2: 27 mmol/L (ref 18–29)
Calcium: 9.1 mg/dL (ref 8.6–10.2)
Creatinine, Ser: 1.07 mg/dL (ref 0.76–1.27)
GFR, EST AFRICAN AMERICAN: 71 mL/min/{1.73_m2} (ref >59)
GFR, EST NON AFRICAN AMERICAN: 61 mL/min/{1.73_m2} (ref >59)
Glucose: 202 mg/dL — ABNORMAL HIGH (ref 65–99)
POTASSIUM: 4.2 mmol/L (ref 3.5–5.2)
SODIUM: 143 mmol/L (ref 134–144)

## 2017-03-25 NOTE — Progress Notes (Signed)
Cardiology Office Note  Date:  03/27/2017   ID:  Joseph [name], DOB 1925-05-17, MRN [id number]  PCP:  Joseph Banana., MD   Chief Complaint  Patient presents with  . other    3 week follow up as well as discuss labs and carotid ultrasound. Patient c/o blood pressure being high. Meds reviewed verbally with patient.     HPI:  Joseph [name] is a 83 year old gentleman with a hx of  CAD, CABG in 1994,  PAD ,followed by Dr. Lucky Cowboy,  obesity,  hyperlipidemia,  arthritis of the knees,   fatigue, SOB, leg weakness,  lower extremity edema, presenting for routine followup of his coronary artery disease and history of acute on chronic diastolic CHF. He is a patient of Dr. Rosanna Randy.  In follow-up today he reports that his blood pressures running high He provides numbers from home typically 130s up to 160s Numbers are higher in the morning, lower in the afternoon Takes all 3 of his blood pressure medications in the morning He takes amlodipine 5 mg, losartan 100, Imdur 30 in the morning  Recent Carotid u/s reviewed with him Less than 39% stenosis bilaterally  Denies having any headaches   headache 02/03/2017,  Seen in th ER for H/A 02/03/17 Confused,  In the emergency room had CT head, Showing atherosclerotic calcification of the cavernous carotid arteries bilaterally. Lab work within normal limits No other focal neurologic deficits Patient and family thinks he had a TIA  In follow up visit was still having  headaches in the afternoon Reports having arthritis in his neck  Takes torsemide as needed for leg swelling Took some recently  Lab work reviewed with him HBA1C 7.0 Total chol 138, LDL 77  Denies any chest pain concerning for angina  Was told by orthopedics he had Spinal stenosis? Recommended to have cortisone injections Legs get "heavy" with walking, sits 2 minutes  Does not feel that he needs injections  EKG Personally reviewed by myself on today's visit   Shows sinus bradycardia rate 47 bpm left anterior fascicular block, intraventricular conduction delay  Other past medical history admission May 2017 for acute cholecystitis, developing acute respiratory distress with hypoxia and acute renal disease, peak creatinine of 2. Notes indicating HIDA scan positive for acute cholecystitis. s/p cholecystostomy tube placed in through interventional radiology 03/24/16 Diuretic was discontinued in the setting of acute renal failure He does report greater than 20 pound weight loss  Echocardiogram 03/22/2016 reviewed with him, ejection fraction 50-55% Chest x-ray 03/29/2016 with small right pleural effusion  Ultrasound of the legs showing patent right SFA stent, left side CFA and popliteal artery with good flow, waveform deterioration around the ankle on the left  Last Cardiac catheterization was performed at [facility] 08/16/2012 Catheterization showed severe three-vessel coronary artery disease with patent grafts x4. There was 80% disease of a small to moderate sized distal left circumflex. Ejection fraction 45-50%. No significant aortic valve stenosis or mitral valve regurgitation. The findings were discussed with interventional cardiology and medical management was recommended for the distal left circumflex disease.  previous Echocardiogram did not show elevated right-sided pressures and ejection fraction was normal. Previous episode of tachycardia in June 2013 that resolved without intervention  Total cholesterol 143, LDL 82  PMH:   has a past medical history of CAD (coronary artery disease); Chronic diastolic CHF (congestive heart failure) ([place]); Diabetes mellitus, type 2 ([place]); HTN (hypertension); Hyperlipidemia; Hypothyroidism; Lower extremity edema; and Spinal stenosis.  PSH:    Past Surgical  History:  Procedure Laterality Date  . APPENDECTOMY    . CARDIAC CATHETERIZATION  07/2012   [facility]/no stents  . CHOLECYSTECTOMY    . CORNEAL TRANSPLANT     . CORONARY ARTERY BYPASS GRAFT  1994  . HEMORRHOID SURGERY    . PROSTATE BIOPSY    . TOE SURGERY     ingrown toe nail    Current Outpatient Prescriptions  Medication Sig Dispense Refill  . amLODipine (NORVASC) 10 MG tablet Take 1 tablet (10 mg total) by mouth daily. 90 tablet 3  . aspirin EC 81 MG tablet Take 1 tablet (81 mg total) by mouth daily. 90 tablet 3  . atorvastatin (LIPITOR) 40 MG tablet Take 1 tablet (40 mg total) by mouth daily. 90 tablet 3  . blood glucose meter kit and supplies KIT Dispense Accuchek Aviva glucometer with 120 test strips.  Test glucose level 3 times per day with meals and at bedtime.  Dispense based on patient and insurance preference. Use up to four times daily as directed. (FOR ICD-9 250.00, 250.01, ICD 10 E11.9). 1 each 0  . calcipotriene (DOVONOX) 0.005 % cream Apply topically 2 (two) times daily.    . Cholecalciferol (VITAMIN D3) 2000 units TABS Take 1 capsule by mouth daily.    . clobetasol cream (TEMOVATE) 9.35 % Apply 1 application topically 2 (two) times daily.    Marland Kitchen glucose blood (ONE TOUCH ULTRA TEST) test strip Check sugar 4 times daily DX:E 11.51 100 each 12  . hydrocortisone 2.5 % cream Apply topically 2 (two) times daily.    . insulin aspart (NOVOLOG) 100 UNIT/ML injection Inject 0-9 Units into the skin 3 (three) times daily with meals. (Patient taking differently: Inject 0-9 Units into the skin as needed. 26 am, 16 lunch, 28 pm) 10 mL 0  . insulin glargine (LANTUS) 100 UNIT/ML injection Inject 0.68 mLs (68 Units total) into the skin at bedtime. (Patient taking differently: Inject 55 Units into the skin at bedtime. 16 am and 65 at bedtime) 10 mL 0  . isosorbide mononitrate (IMDUR) 30 MG 24 hr tablet Take 1 tablet (30 mg total) by mouth daily. 30 tablet 6  . ketoconazole (NIZORAL) 2 % cream Apply 1 application topically 2 (two) times daily as needed for irritation.    Marland Kitchen levothyroxine (SYNTHROID, LEVOTHROID) 50 MCG tablet Take 1 tablet (50 mcg  total) by mouth daily before breakfast. 30 tablet 12  . losartan (COZAAR) 100 MG tablet Take 1 tablet (100 mg total) by mouth daily. 90 tablet 3  . Magnesium 250 MG TABS Take by mouth daily as needed.    . mometasone (ELOCON) 0.1 % ointment Apply topically daily.    . nitroGLYCERIN (NITROSTAT) 0.4 MG SL tablet Place 1 tablet (0.4 mg total) under the tongue every 5 (five) minutes as needed for chest pain. 25 tablet 3  . omeprazole (PRILOSEC) 20 MG capsule Take 20 mg by mouth daily.    Glory Rosebush DELICA LANCETS 70V MISC Check sugar 4 times daily. DX E11.51 100 each 12  . potassium chloride (K-DUR,KLOR-CON) 10 MEQ tablet Take 10 mEq by mouth daily as needed.    . torsemide (DEMADEX) 20 MG tablet Take 20 mg by mouth daily as needed.    . vitamin C (ASCORBIC ACID) 500 MG tablet Take 500 mg by mouth daily.     No current facility-administered medications for this visit.      Allergies:   Patient has no known allergies.   Social History:  The  patient  reports that he quit smoking about 36 years ago. His smoking use included Pipe and Cigars. He smoked 0.00 packs per day for 6.00 years. He has never used smokeless tobacco. He reports that he does not drink alcohol or use drugs.   Family History:   family history includes Heart attack in his father; Heart failure in his maternal grandfather, maternal uncle, and mother; Stroke in his brother.    Review of Systems: Review of Systems  Constitutional: Negative.        Previous episode of headache with confusion, confusion now resolved  Respiratory: Negative.   Cardiovascular: Negative.   Gastrointestinal: Negative.   Musculoskeletal: Negative.   Neurological: Negative.   Psychiatric/Behavioral: Negative.   All other systems reviewed and are negative.    PHYSICAL EXAM: VS:  BP (!) 150/58 (BP Location: Left Arm, Patient Position: Sitting, Cuff Size: Normal)   Pulse (!) 47   Ht 5' 6" (1.676 m)   Wt 204 lb 8 oz (92.8 kg)   BMI 33.01 kg/m  ,  BMI Body mass index is 33.01 kg/m. GEN: Well nourished, well developed, in no acute distress,  obese HEENT: normal  Neck: no JVD, carotid bruits, or masses Cardiac: RRR; no murmurs, rubs, or gallops,no edema  Respiratory:  clear to auscultation bilaterally, normal work of breathing GI: soft, nontender, nondistended, + BS MS: no deformity or atrophy  Skin: warm and dry, no rash Neuro:  Strength and sensation are intact Psych: euthymic mood, full affect    Recent Labs: 06/16/2016: TSH 1.630 02/03/2017: ALT 18; Hemoglobin 13.6; Platelets 180 03/17/2017: BUN 21; Creatinine, Ser 1.07; Potassium 4.2; Sodium 143    Lipid Panel Lab Results  Component Value Date   CHOL 138 12/06/2016   HDL 44 12/06/2016   LDLCALC 77 12/06/2016   TRIG 85 12/06/2016      Wt Readings from Last 3 Encounters:  03/27/17 204 lb 8 oz (92.8 kg)  03/02/17 204 lb (92.5 kg)  02/24/17 205 lb 8 oz (93.2 kg)       ASSESSMENT AND PLAN:  Mixed hyperlipidemia -  LDL slightly above goal,  continue current regimen for now  HYPERTENSION, BENIGN -  Recommended he increase amlodipine up to 10 mg, take this in the p.m. Continue isosorbide 30 mg daily in the morning  Continue losartan 100 mg in the morning  torsemide 3 days per week, as needed for leg swelling  Atherosclerosis of coronary artery bypass graft of native heart with stable angina pectoris (HCC) -  Currently with no symptoms of angina. No further workup at this time. Continue current medication regimen.  Diabetes mellitus with peripheral vascular disease ([place]) -  We have encouraged continued exercise, careful diet management in an effort to lose weight.  Diastolic CHF, acute on chronic (HCC) -  Torsemide 3 days per week with potassium Recommended he moderate his fluid intake.   SOB (shortness of breath) -  Symptoms likely exacerbated by obesity, deconditioning, sedentary lifestyle, Possible chronic diastolic CHF We'll start the torsemide 3 times  a week Improved leg swelling on today's visit  Morbid obesity due to excess calories ([place]) -  We have encouraged continued exercise, careful diet management in an effort to lose weight.  Bilateral leg edema -  Leg swelling has resolved on torsemide, when necessary   Nonintractable headache, unspecified chronicity pattern, unspecified headache type - Headache resolved  unclear if this was secondary to neck arthritis or high blood pressure Family concerned about TIA  PAD (  peripheral artery disease) ([place]) -  Stressed importance of aggressive diabetes control, low cholesterol   Total encounter time more than 25 minutes  Greater than 50% was spent in counseling and coordination of care with the patient   Disposition:   F/U  6 months   Orders Placed This Encounter  Procedures  . EKG 12-Lead     Signed, Esmond Plants, M.D., Ph.D. 03/27/2017  [facility] [place], [place] [phone]

## 2017-03-27 ENCOUNTER — Encounter: Payer: Self-pay | Admitting: Cardiovascular Disease

## 2017-03-27 ENCOUNTER — Ambulatory Visit (INDEPENDENT_AMBULATORY_CARE_PROVIDER_SITE_OTHER): Payer: PPO | Admitting: Cardiovascular Disease

## 2017-03-27 VITALS — BP 150/58 | HR 47 | Ht 66.0 in | Wt 204.5 lb

## 2017-03-27 DIAGNOSIS — I25708 Atherosclerosis of coronary artery bypass graft(s), unspecified, with other forms of angina pectoris: Secondary | ICD-10-CM | POA: Diagnosis not present

## 2017-03-27 DIAGNOSIS — E1151 Type 2 diabetes mellitus with diabetic peripheral angiopathy without gangrene: Secondary | ICD-10-CM | POA: Diagnosis not present

## 2017-03-27 DIAGNOSIS — Z951 Presence of aortocoronary bypass graft: Secondary | ICD-10-CM

## 2017-03-27 DIAGNOSIS — I10 Essential (primary) hypertension: Secondary | ICD-10-CM | POA: Diagnosis not present

## 2017-03-27 DIAGNOSIS — I5033 Acute on chronic diastolic (congestive) heart failure: Secondary | ICD-10-CM

## 2017-03-27 DIAGNOSIS — E782 Mixed hyperlipidemia: Secondary | ICD-10-CM | POA: Diagnosis not present

## 2017-03-27 DIAGNOSIS — R600 Localized edema: Secondary | ICD-10-CM | POA: Diagnosis not present

## 2017-03-27 DIAGNOSIS — G4459 Other complicated headache syndrome: Secondary | ICD-10-CM | POA: Diagnosis not present

## 2017-03-27 MED ORDER — AMLODIPINE BESYLATE 10 MG PO TABS: 10.0000 mg | ORAL_TABLET | Freq: Every day | ORAL | 3 refills | Status: DC

## 2017-03-27 NOTE — Patient Instructions (Addendum)
Medication Instructions:   Please increase the amlodipine up to 10 mg daily, Take at night  Monitor blood pressure Goal blood pressure number on the top is <145 Bottom number <90  Labwork:  No new labs needed  Testing/Procedures:  No further testing at this time   I recommend watching educational videos on topics of interest to you at:       [email or web address]  Enter code: [email or web address]    Follow-Up: It was a pleasure seeing you in the office today. Please call us if you have new issues that need to be addressed before your next appt.  [phone]  Your physician wants you to follow-up in: 6 months.  You will receive a reminder letter in the mail two months in advance. If you don't receive a letter, please call our office to schedule the follow-up appointment.  If you need a refill on your cardiac medications before your next appointment, please call your pharmacy.

## 2017-03-28 ENCOUNTER — Ambulatory Visit: Payer: PPO | Admitting: Family Medicine

## 2017-04-06 ENCOUNTER — Ambulatory Visit (INDEPENDENT_AMBULATORY_CARE_PROVIDER_SITE_OTHER): Payer: PPO | Admitting: Family Medicine

## 2017-04-06 ENCOUNTER — Encounter: Payer: Self-pay | Admitting: Family Medicine

## 2017-04-06 VITALS — BP 140/58 | HR 56 | Temp 97.4°F | Resp 16 | Wt 203.0 lb

## 2017-04-06 DIAGNOSIS — R51 Headache: Secondary | ICD-10-CM

## 2017-04-06 DIAGNOSIS — R519 Headache, unspecified: Secondary | ICD-10-CM

## 2017-04-06 DIAGNOSIS — L03115 Cellulitis of right lower limb: Secondary | ICD-10-CM

## 2017-04-06 DIAGNOSIS — I10 Essential (primary) hypertension: Secondary | ICD-10-CM

## 2017-04-06 MED ORDER — CEPHALEXIN 500 MG PO CAPS: 500.0000 mg | ORAL_CAPSULE | Freq: Two times a day (BID) | ORAL | 0 refills | Status: DC

## 2017-04-06 NOTE — Patient Instructions (Signed)
Pick up antibiotic from pharmacy. Call if leg does not improved.

## 2017-04-06 NOTE — Progress Notes (Signed)
Subjective:  HPI Pt reports that about a week ago he tripped over a wire and cut his right leg. He thought it was getting better then at the beginning of this week he started noticing it getting red on the areas around the wound and some swelling. The area feel warmer than the rest of his skin. He also says that he has been having swelling in his ankles prior to this injury. Dr. Rockey Situ also increased his Amlodipine to 10 mg daily about the same time that the swelling started, but pt noticed it and started taking his fluid pill again. He noticed some improvement. He is also due for a follow up for BP and headache next week and wants to combine the visit. He reports that he has not had any headaches since prior to last OV.  Headache has not recurred. Tolerating meds.  Prior to Admission medications   Medication Sig Start Date End Date Taking? Authorizing Provider  amLODipine (NORVASC) 10 MG tablet Take 1 tablet (10 mg total) by mouth daily. 03/27/17   Minna Merritts, MD  aspirin EC 81 MG tablet Take 1 tablet (81 mg total) by mouth daily. 03/17/16   Dunn, Areta Haber, PA-C  atorvastatin (LIPITOR) 40 MG tablet Take 1 tablet (40 mg total) by mouth daily. 12/01/16   Minna Merritts, MD  blood glucose meter kit and supplies KIT Dispense Accuchek Aviva glucometer with 120 test strips.  Test glucose level 3 times per day with meals and at bedtime.  Dispense based on patient and insurance preference. Use up to four times daily as directed. (FOR ICD-9 250.00, 250.01, ICD 10 E11.9). 03/28/16   Lance Coon, MD  calcipotriene (DOVONOX) 0.005 % cream Apply topically 2 (two) times daily.    [provider]  Cholecalciferol (VITAMIN D3) 2000 units TABS Take 1 capsule by mouth daily.    [provider]  clobetasol cream (TEMOVATE) 8.41 % Apply 1 application topically 2 (two) times daily.    [provider]  glucose blood (ONE TOUCH ULTRA TEST) test strip Check sugar 4 times daily DX:E 11.51  04/15/16   Margarita Rana, MD  hydrocortisone 2.5 % cream Apply topically 2 (two) times daily.    [provider]  insulin aspart (NOVOLOG) 100 UNIT/ML injection Inject 0-9 Units into the skin 3 (three) times daily with meals. Patient taking differently: Inject 0-9 Units into the skin as needed. 26 am, 16 lunch, 28 pm 03/28/16   Demetrios Loll, MD  insulin glargine (LANTUS) 100 UNIT/ML injection Inject 0.68 mLs (68 Units total) into the skin at bedtime. Patient taking differently: Inject 55 Units into the skin at bedtime. 16 am and 65 at bedtime 03/28/16   Demetrios Loll, MD  isosorbide mononitrate (IMDUR) 30 MG 24 hr tablet Take 1 tablet (30 mg total) by mouth daily. 02/24/17   Minna Merritts, MD  ketoconazole (NIZORAL) 2 % cream Apply 1 application topically 2 (two) times daily as needed for irritation.    [provider]  levothyroxine (SYNTHROID, LEVOTHROID) 50 MCG tablet Take 1 tablet (50 mcg total) by mouth daily before breakfast. 06/01/16   [name]., MD  losartan (COZAAR) 100 MG tablet Take 1 tablet (100 mg total) by mouth daily. 02/08/17 02/08/18  [name]., MD  Magnesium 250 MG TABS Take by mouth daily as needed.    [provider]  mometasone (ELOCON) 0.1 % ointment Apply topically daily.    [provider]  nitroGLYCERIN (NITROSTAT) 0.4 MG SL tablet Place 1 tablet (0.4 mg total) under the tongue every 5 (five) minutes as needed for chest pain. 04/29/14   Rogelia Mire, NP  omeprazole (PRILOSEC) 20 MG capsule Take 20 mg by mouth daily.    [provider]  [facility] DELICA LANCETS 93Z MISC Check sugar 4 times daily. DX E11.51 04/15/16   Margarita Rana, MD  potassium chloride (K-DUR,KLOR-CON) 10 MEQ tablet Take 10 mEq by mouth daily as needed.    [provider]  torsemide (DEMADEX) 20 MG tablet Take 20 mg by mouth daily as needed.    [provider]  vitamin C (ASCORBIC ACID) 500 MG tablet Take 500 mg by mouth  daily.    [provider]    Patient Active Problem List   Diagnosis Date Noted  . Acute cholecystitis   . Abdominal pain   . Calculus of gallbladder with acute cholecystitis without obstruction   . Diverticulitis of large intestine without perforation or abscess without bleeding   . Pain of upper abdomen   . SOB (shortness of breath)   . S/P CABG (coronary artery bypass graft)   . Morbid obesity due to excess calories ([place])   . Acute respiratory failure with hypoxia ([place]) 03/21/2016  . Back pain, chronic 07/28/2015  . Benign fibroma of prostate 07/28/2015  . Acid reflux 07/28/2015  . Adult hypothyroidism 07/28/2015  . Eunuchoidism 07/28/2015  . Neuropathy 07/28/2015  . Arthritis, degenerative 07/28/2015  . Psoriasis 07/28/2015  . Obesity 09/09/2014  . Bilateral leg weakness 09/09/2014  . Diastolic CHF, acute on chronic ([place]) 05/13/2014  . Bilateral leg edema 04/15/2014  . Bradycardia 08/13/2013  . Edema 06/21/2012  . Fatigue 09/15/2011  . Diabetes mellitus with peripheral vascular disease ([place]) 09/15/2011  . DYSPNEA 06/09/2009  . Hyperlipidemia 06/07/2009  . HYPERTENSION, BENIGN 06/07/2009  . CAD, ARTERY BYPASS GRAFT 06/07/2009    Past Medical History:  Diagnosis Date  . CAD (coronary artery disease)    a. s/p CABG;  b. 07/2012 Cath: 3VD including 80 dLCX (med Rx), 4/4 patent grafts.  . Chronic diastolic CHF (congestive heart failure) ([place])    a. 07/2012 Echo: EF 55-60%, no rwma, Gr 1 DD, mild MR;  04/2014 Echo: EF 55-60%, no rwma, mild MR, mildly dil LA.  . Diabetes mellitus, type 2 ([place])   . HTN (hypertension)   . Hyperlipidemia   . Hypothyroidism   . Lower extremity edema    a. 03/2014 amlodipine d/c'd.  Marland Kitchen Spinal stenosis     Social History   Social History  . Marital status: Married    Spouse name: N/A  . Number of children: N/A  . Years of education: N/A   Occupational History  . Not on file.   Social History Main Topics  . Smoking status:  Former Smoker    Packs/day: 0.00    Years: 6.00    Types: Pipe, Cigars    Quit date: 12/23/1980  . Smokeless tobacco: Never Used  . Alcohol use No  . Drug use: No  . Sexual activity: No   Other Topics Concern  . Not on file   Social History Narrative   Retired.     No Known Allergies  Review of Systems  Constitutional: Negative.   HENT: Negative.   Eyes: Negative.   Respiratory: Negative.   Cardiovascular: Positive for leg swelling.  Gastrointestinal: Negative.   Genitourinary: Negative.   Musculoskeletal: Negative.   Skin:  Cuts and redness on lower right leg  Neurological: Negative.   Endo/Heme/Allergies: Negative.   Psychiatric/Behavioral: Negative.     Immunization History  Administered Date(s) Administered  . Influenza, High Dose Seasonal PF 09/01/2015  . Influenza,inj,Quad PF,36+ Mos 08/18/2016  . Pneumococcal Conjugate-13 03/24/2015  . Pneumococcal Polysaccharide-23 10/18/2014  . Tdap 08/04/2014  . Zoster 08/04/2014    Objective:  BP (!) 140/58 (BP Location: Left Arm, Patient Position: Sitting, Cuff Size: Normal)   Pulse (!) 56   Temp 97.4 F (36.3 C) (Oral)   Resp 16   Wt 203 lb (92.1 kg)   BMI 32.77 kg/m   Physical Exam  Constitutional: He is oriented to person, place, and time and well-developed, well-nourished, and in no distress.  HENT:  Head: Normocephalic and atraumatic.  Right Ear: External ear normal.  Left Ear: External ear normal.  Nose: Nose normal.  Eyes: Conjunctivae and EOM are normal. Pupils are equal, round, and reactive to light.  Neck: Normal range of motion. Neck supple.  Cardiovascular: Normal rate, regular rhythm, normal heart sounds and intact distal pulses.   Pulmonary/Chest: Effort normal and breath sounds normal.  Abdominal: Soft.  Musculoskeletal: Normal range of motion.  Erythema of right lower leg medial and distal to the abrasion  Neurological: He is alert and oriented to person, place, and time. He has normal  reflexes. Gait normal. GCS score is 15.  Skin: Skin is warm and dry.  Superficial abrasion of lower leg with medial erythema,warmth.  Psychiatric: Mood, memory, affect and judgment normal.    Lab Results  Component Value Date   WBC 9.1 02/03/2017   HGB 13.6 02/03/2017   HCT 39.2 (L) 02/03/2017   PLT 180 02/03/2017   GLUCOSE 202 (H) 03/17/2017   CHOL 138 12/06/2016   TRIG 85 12/06/2016   HDL 44 12/06/2016   LDLCALC 77 12/06/2016   TSH 1.630 06/16/2016   INR 1.37 03/24/2016   HGBA1C 7.0 01/22/2017    CMP     Component Value Date/Time   NA 143 03/17/2017 1354   K 4.2 03/17/2017 1354   CL 101 03/17/2017 1354   CO2 27 03/17/2017 1354   GLUCOSE 202 (H) 03/17/2017 1354   GLUCOSE 107 (H) 02/03/2017 1319   BUN 21 03/17/2017 1354   CREATININE 1.07 03/17/2017 1354   CALCIUM 9.1 03/17/2017 1354   PROT 6.5 02/03/2017 1319   PROT 6.1 12/06/2016 0902   ALBUMIN 3.2 (L) 02/03/2017 1319   ALBUMIN 3.4 (L) 12/06/2016 0902   AST 25 02/03/2017 1319   ALT 18 02/03/2017 1319   ALKPHOS 47 02/03/2017 1319   BILITOT 1.0 02/03/2017 1319   BILITOT 0.6 12/06/2016 0902   GFRNONAA 61 03/17/2017 1354   GFRAA 71 03/17/2017 1354    Assessment and Plan :  1. Cellulitis of right lower extremity Mild erythema and swelling of right lower leg. Treatment with keflex. Call if no better.  - cephALEXin (KEFLEX) 500 MG capsule; Take 1 capsule (500 mg total) by mouth 2 (two) times daily.  Dispense: 14 capsule; Refill: 0  2. HYPERTENSION, BENIGN Improved.   3. Nonintractable episodic headache, unspecified headache type Resolved.  4.CAD  HPI, Exam, and A&P Transcribed under the direction and in the presence of Richard L. Cranford Mon, MD  Electronically Signed: Katina Dung, CMA I have done the exam and reviewed the above chart and it is accurate to the best of my knowledge. Development worker, community has been used in this note in any air is in  the dictation or transcription are unintentional.  Miguel Aschoff MD [facility] 04/06/2017 2:01 PM

## 2017-04-13 ENCOUNTER — Ambulatory Visit: Payer: PPO | Admitting: Family Medicine

## 2017-04-26 DIAGNOSIS — Z794 Long term (current) use of insulin: Secondary | ICD-10-CM | POA: Diagnosis not present

## 2017-04-26 DIAGNOSIS — E1159 Type 2 diabetes mellitus with other circulatory complications: Secondary | ICD-10-CM | POA: Diagnosis not present

## 2017-05-04 DIAGNOSIS — E119 Type 2 diabetes mellitus without complications: Secondary | ICD-10-CM | POA: Diagnosis not present

## 2017-05-04 DIAGNOSIS — M79671 Pain in right foot: Secondary | ICD-10-CM | POA: Diagnosis not present

## 2017-05-04 DIAGNOSIS — E1042 Type 1 diabetes mellitus with diabetic polyneuropathy: Secondary | ICD-10-CM | POA: Diagnosis not present

## 2017-05-04 DIAGNOSIS — L600 Ingrowing nail: Secondary | ICD-10-CM | POA: Diagnosis not present

## 2017-05-04 DIAGNOSIS — M79672 Pain in left foot: Secondary | ICD-10-CM | POA: Diagnosis not present

## 2017-05-04 DIAGNOSIS — Z794 Long term (current) use of insulin: Secondary | ICD-10-CM | POA: Diagnosis not present

## 2017-05-22 ENCOUNTER — Other Ambulatory Visit: Payer: Self-pay | Admitting: Family Medicine

## 2017-05-22 NOTE — Telephone Encounter (Signed)
Please review for dr gilbert-aa 

## 2017-06-05 ENCOUNTER — Ambulatory Visit (INDEPENDENT_AMBULATORY_CARE_PROVIDER_SITE_OTHER): Payer: PPO | Admitting: Physician Assistant

## 2017-06-05 ENCOUNTER — Other Ambulatory Visit: Payer: Self-pay | Admitting: *Deleted

## 2017-06-05 ENCOUNTER — Telehealth: Payer: Self-pay | Admitting: Family Medicine

## 2017-06-05 ENCOUNTER — Encounter: Payer: Self-pay | Admitting: Physician Assistant

## 2017-06-05 VITALS — BP 116/52 | HR 80 | Temp 98.7°F | Resp 16 | Wt 200.0 lb

## 2017-06-05 DIAGNOSIS — R300 Dysuria: Secondary | ICD-10-CM

## 2017-06-05 DIAGNOSIS — I5033 Acute on chronic diastolic (congestive) heart failure: Secondary | ICD-10-CM | POA: Diagnosis not present

## 2017-06-05 DIAGNOSIS — E1151 Type 2 diabetes mellitus with diabetic peripheral angiopathy without gangrene: Secondary | ICD-10-CM | POA: Diagnosis not present

## 2017-06-05 DIAGNOSIS — N3090 Cystitis, unspecified without hematuria: Secondary | ICD-10-CM | POA: Diagnosis not present

## 2017-06-05 LAB — POCT URINALYSIS DIPSTICK
Bilirubin, UA: NEGATIVE
Glucose, UA: NEGATIVE
Ketones, UA: NEGATIVE
Nitrite, UA: NEGATIVE
Spec Grav, UA: 1.025 (ref 1.010–1.025)
Urobilinogen, UA: 0.2 E.U./dL
pH, UA: 5 (ref 5.0–8.0)

## 2017-06-05 MED ORDER — SULFAMETHOXAZOLE-TRIMETHOPRIM 800-160 MG PO TABS: 1.0000 | ORAL_TABLET | Freq: Two times a day (BID) | ORAL | 0 refills | Status: AC

## 2017-06-05 NOTE — Telephone Encounter (Signed)
Pt reports that he is having dysuria, urinary retention, and fever of 101.0 yesterday. I have scheduled pt an appt for 1:00 today with Joseph [name].

## 2017-06-05 NOTE — Telephone Encounter (Signed)
Pt is experiencing UITI symptoms.  He does not want to come in.  He would like an antibiotic called into [facility].  Pt's call back is [phone]  Lavonne Chick

## 2017-06-05 NOTE — Patient Instructions (Signed)
Prostatitis Prostatitis is swelling of the prostate gland. The prostate helps to make semen. It is below a man's bladder, in front of the rectum. There are different types of prostatitis. Follow these instructions at home:  Take over-the-counter and prescription medicines only as told by your doctor.  If you were prescribed an antibiotic medicine, take it as told by your doctor. Do not stop taking the antibiotic even if you start to feel better.  If your doctor prescribed exercises, do them as directed.  Take sitz baths as told by your doctor. To take a sitz bath, sit in warm water that is deep enough to cover your hips and butt.  Keep all follow-up visits as told by your doctor. This is important. Contact a doctor if:  Your symptoms get worse.  You have a fever. Get help right away if:  You have chills.  You feel sick to your stomach (nauseous).  You throw up (vomit).  You feel light-headed.  You feel like you might pass out (faint).  You cannot pee (urinate).  You have blood or clumps of blood (blood clots) in your pee (urine). This information is not intended to replace advice given to you by your health care provider. Make sure you discuss any questions you have with your health care provider. Document Released: 05/08/2012 Document Revised: 07/28/2016 Document Reviewed: 07/28/2016 Elsevier Interactive Patient Education  2017 Elsevier Inc.  

## 2017-06-05 NOTE — Progress Notes (Signed)
Patient: Joseph [name] Male    DOB: [birth year]   83 y.o.   MRN: [id number] Visit Date: 06/05/2017  Today's Provider: Trinna Post, PA-C   Chief Complaint  Patient presents with  . Urinary Tract Infection   Subjective:      Joseph [name] is an 84 y/o man with no abx allergies, DM II, Diastolic CHF presenting today with dysuria x 3 days. He had a fever of 101.8 that resolved with some Aspirin. Had some cold chills and continued frequency. No rectal or penile pain. No concern of sexually transmitted infection.   Urinary Tract Infection   This is a new problem. The current episode started in the past 7 days. The problem occurs every urination. The problem has been unchanged. The maximum temperature recorded prior to his arrival was 101 - 101.9 F. The fever has been present for less than 1 day. Associated symptoms include chills, frequency and urgency. Pertinent negatives include no discharge, flank pain, hematuria, hesitancy, nausea, possible pregnancy, sweats or vomiting. His past medical history is significant for recurrent UTIs.       No Known Allergies   Current Outpatient Prescriptions:  .  amLODipine (NORVASC) 10 MG tablet, Take 1 tablet (10 mg total) by mouth daily., Disp: 90 tablet, Rfl: 3 .  aspirin EC 81 MG tablet, Take 1 tablet (81 mg total) by mouth daily., Disp: 90 tablet, Rfl: 3 .  atorvastatin (LIPITOR) 40 MG tablet, Take 1 tablet (40 mg total) by mouth daily., Disp: 90 tablet, Rfl: 3 .  blood glucose meter kit and supplies KIT, Dispense Accuchek Aviva glucometer with 120 test strips.  Test glucose level 3 times per day with meals and at bedtime.  Dispense based on patient and insurance preference. Use up to four times daily as directed. (FOR ICD-9 250.00, 250.01, ICD 10 E11.9)., Disp: 1 each, Rfl: 0 .  Cholecalciferol (VITAMIN D3) 2000 units TABS, Take 1 capsule by mouth daily., Disp: , Rfl:  .  clobetasol cream (TEMOVATE) 1.66 %, Apply 1 application  topically 2 (two) times daily., Disp: , Rfl:  .  glucose blood (ONE TOUCH ULTRA TEST) test strip, Check sugar 4 times daily DX:E 11.51, Disp: 100 each, Rfl: 12 .  hydrocortisone 2.5 % cream, Apply topically 2 (two) times daily., Disp: , Rfl:  .  insulin aspart (NOVOLOG) 100 UNIT/ML injection, Inject 0-9 Units into the skin 3 (three) times daily with meals. (Patient taking differently: Inject 0-9 Units into the skin as needed. 26 am, 16 lunch, 28 pm), Disp: 10 mL, Rfl: 0 .  insulin glargine (LANTUS) 100 UNIT/ML injection, Inject 0.68 mLs (68 Units total) into the skin at bedtime. (Patient taking differently: Inject 55 Units into the skin at bedtime. 16 am and 65 at bedtime), Disp: 10 mL, Rfl: 0 .  isosorbide mononitrate (IMDUR) 30 MG 24 hr tablet, Take 1 tablet (30 mg total) by mouth daily., Disp: 30 tablet, Rfl: 6 .  ketoconazole (NIZORAL) 2 % cream, Apply 1 application topically 2 (two) times daily as needed for irritation., Disp: , Rfl:  .  levothyroxine (SYNTHROID, LEVOTHROID) 50 MCG tablet, Take 1 tablet (50 mcg total) by mouth daily before breakfast., Disp: 30 tablet, Rfl: 12 .  losartan (COZAAR) 100 MG tablet, Take 1 tablet (100 mg total) by mouth daily., Disp: 90 tablet, Rfl: 3 .  Magnesium 250 MG TABS, Take by mouth daily as needed., Disp: , Rfl:  .  mometasone (ELOCON) 0.1 % ointment,  Apply topically daily., Disp: , Rfl:  .  nitroGLYCERIN (NITROSTAT) 0.4 MG SL tablet, Place 1 tablet (0.4 mg total) under the tongue every 5 (five) minutes as needed for chest pain., Disp: 25 tablet, Rfl: 3 .  omeprazole (PRILOSEC) 20 MG capsule, TAKE ONE CAPSULE BY MOUTH ONCE DAILY, Disp: 30 capsule, Rfl: 0 .  potassium chloride (K-DUR,KLOR-CON) 10 MEQ tablet, Take 10 mEq by mouth daily as needed., Disp: , Rfl:  .  torsemide (DEMADEX) 20 MG tablet, Take 20 mg by mouth daily as needed., Disp: , Rfl:  .  vitamin C (ASCORBIC ACID) 500 MG tablet, Take 500 mg by mouth daily., Disp: , Rfl:  .  calcipotriene (DOVONOX)  0.005 % cream, Apply topically 2 (two) times daily., Disp: , Rfl:  .  ONETOUCH DELICA LANCETS 61W MISC, Check sugar 4 times daily. DX E11.51, Disp: 100 each, Rfl: 12  Review of Systems  Constitutional: Positive for chills, fatigue and fever (Pt reports a temperature of 101 on Saturday.). Negative for activity change, appetite change, diaphoresis and unexpected weight change.  Gastrointestinal: Negative for nausea and vomiting.  Genitourinary: Positive for decreased urine volume, difficulty urinating, dysuria, frequency and urgency. Negative for discharge, flank pain, genital sores, hematuria, hesitancy, penile pain, penile swelling, scrotal swelling and testicular pain.  Musculoskeletal: Negative for back pain.    Social History  Substance Use Topics  . Smoking status: Former Smoker    Packs/day: 0.00    Years: 6.00    Types: Pipe, Cigars    Quit date: 12/23/1980  . Smokeless tobacco: Never Used  . Alcohol use No   Objective:   BP (!) 116/52 (BP Location: Right Arm, Patient Position: Sitting, Cuff Size: Large)   Pulse 80   Temp 98.7 F (37.1 C) (Oral)   Resp 16   Wt 200 lb (90.7 kg)   BMI 32.28 kg/m  Vitals:   06/05/17 1320  BP: (!) 116/52  Pulse: 80  Resp: 16  Temp: 98.7 F (37.1 C)  TempSrc: Oral  Weight: 200 lb (90.7 kg)     Physical Exam  Constitutional: He is oriented to person, place, and time. He appears well-developed and well-nourished.  Cardiovascular: Normal rate and regular rhythm.   Pulmonary/Chest: Effort normal and breath sounds normal.  Abdominal: Soft. Bowel sounds are normal. He exhibits no distension. There is no tenderness. There is no rebound, no guarding and no CVA tenderness.  Neurological: He is alert and oriented to person, place, and time.  Skin: Skin is warm and dry.  Psychiatric: He has a normal mood and affect. His behavior is normal.        Assessment & Plan:     1. Cystitis  Dipstick positive for leukocytes and trace blood. Send for  culture, treat as below. Return precautions counseled.  - POCT urinalysis dipstick - sulfamethoxazole-trimethoprim (BACTRIM DS,SEPTRA DS) 800-160 MG tablet; Take 1 tablet by mouth 2 (two) times daily.  Dispense: 20 tablet; Refill: 0  2. Dysuria  - CULTURE, URINE COMPREHENSIVE  3. Diabetes mellitus with peripheral vascular disease (HCC)  Stable, last A1C 7.0. Keep checking sugars.   4. Diastolic CHF, acute on chronic (HCC)  Stable. Followed by Dr. Hedy Camara.  The entirety of the information documented in the History of Present Illness, Review of Systems and Physical Exam were personally obtained by me. Portions of this information were initially documented by Ashley Royalty, CMA and reviewed by me for thoroughness and accuracy.   Return if symptoms worsen or fail to  improve.  F/u PRN       Trinna Post, PA-C  [facility]

## 2017-06-05 NOTE — Patient Outreach (Signed)
[facility]) Care Management  06/05/2017  Joseph [name] 20-Apr-1925 [id number]   Nurse Call Center  Outreach attempt # 1, spoke with patient regarding phone call to Nurse Call Center. HIPAA verified with patient. Patient reported calling the call center about his BG levels ranging in the 300's. Patient stated, his BG levels were "all over the place this weekend". He was able to schedule an appointment with his PCP today (06/05/17). He stated, he was diagnosed with an enlarged prostate and UTI. He was given Bactrim DS for 10 days. He discussed in length about notifying the doctor, if his symptoms doesn't improve. Patient thankful for the return phone call. Informed patient to contact his MD or the Nurse Call Center in the future, if needed.   Plan:  RN CM will notify [facility] CM administrative assistant regarding case closure.   Lake Bells, RN, BSN, MHA/MSL, [place] Telephonic Care Manager Coordinator [facility] Direct Phone: [phone] Toll Free: [phone] Fax: [phone]

## 2017-06-07 LAB — CULTURE, URINE COMPREHENSIVE

## 2017-06-07 LAB — PLEASE NOTE

## 2017-06-08 NOTE — Progress Notes (Signed)
Advised  ED 

## 2017-06-26 ENCOUNTER — Other Ambulatory Visit: Payer: Self-pay | Admitting: Family Medicine

## 2017-07-10 ENCOUNTER — Other Ambulatory Visit: Payer: Self-pay | Admitting: Cardiovascular Disease

## 2017-07-22 IMAGING — DX DG CHEST 1V
1 series · 1 of 1 positions shown · non-contrast
Comparison: 03/21/2016

CLINICAL DATA: Chronic diastolic heart failure

EXAM:
CHEST 1 VIEW

[chest ap]
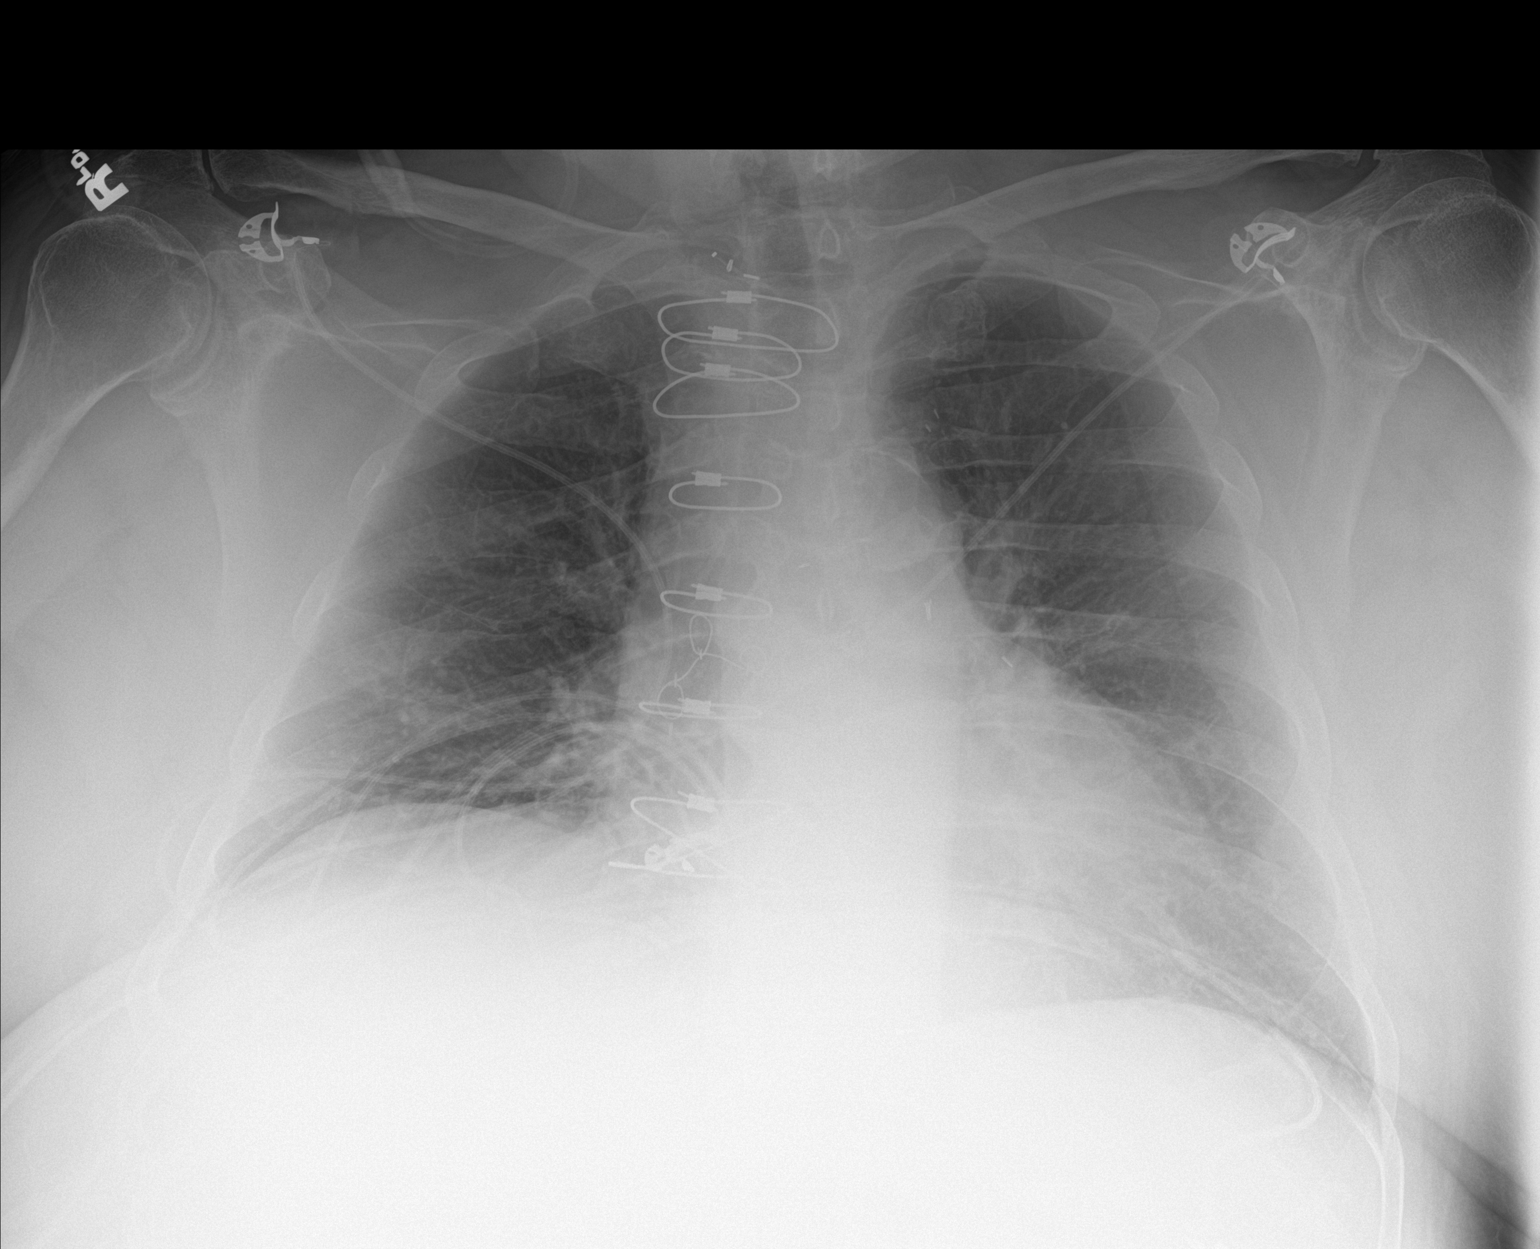

[1 of 1 positions shown; findings below may reference images not displayed]

FINDINGS: Previous median sternotomy and CABG procedure. Mild cardiac
enlargement. No pleural effusion or edema. The visualized osseous
structures appear intact.
IMPRESSION: 1. No evidence for CHF.

## 2017-07-23 IMAGING — NM NM HEPATOBILIARY IMAGE, INC GB
2 series · 12 of 12 positions shown · non-contrast
Comparison: CT 03/21/2016, ultrasound 03/21/2016

CLINICAL DATA: Abdominal pain with gallstones. Elevated white blood
cell count.

EXAM:
NUCLEAR MEDICINE HEPATOBILIARY IMAGING
TECHNIQUE: Sequential images of the abdomen were obtained [DATE] minutes
following intravenous administration of radiopharmaceutical. 4.0 mg
IV morphine was administered IV after 60 minutes of imaging.
RADIOPHARMACEUTICALS:  5.4 mCi Tc-77m Choletec IV additional
millicuries Choletec given as booster dose.

[Series 1000: hepatobiliary scan · 9.59mm/px · 6 of 60 frames shown (1 of 2)]
[frame 6/60]
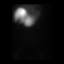
[frame 16/60]
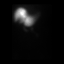
[frame 26/60]
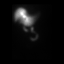
[frame 36/60]
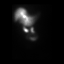
[frame 46/60]
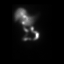
[frame 56/60]
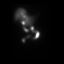

[Series 1000: hepatobiliary scan · 9.59mm/px · 6 of 60 frames shown (2 of 2)]
[frame 6/60]
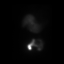
[frame 16/60]
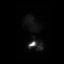
[frame 26/60]
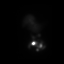
[frame 36/60]
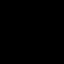
[frame 46/60]
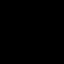
[frame 56/60]
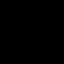

[12 of 12 positions shown; findings below may reference images not displayed]

FINDINGS: There is prompt clearance of radiotracer from the blood pool and
homogeneous uptake in the liver. Counts are evident insmall bowel by
20 minutes. The gallbladder does not fill by 1 hour. IV morphine was
administered to constricts the sphincter of [place]. No filling defect
of the gallbladder was evident following IV morphine administration.
IMPRESSION: 1. Non filling of the gallbladder after 90 minutes of imaging and
administration of IV morphine is consistent with obstruction of the
cystic duct and ACUTE CHOLECYSTITIS.
2. Patent common bile duct.

Findings conveyed Yordi Braverman, RN on 03/23/2016  at[DATE].

## 2017-07-25 ENCOUNTER — Other Ambulatory Visit: Payer: Self-pay | Admitting: Physician Assistant

## 2017-08-08 ENCOUNTER — Ambulatory Visit (INDEPENDENT_AMBULATORY_CARE_PROVIDER_SITE_OTHER): Payer: PPO | Admitting: Family Medicine

## 2017-08-08 VITALS — BP 118/58 | HR 60 | Temp 98.1°F | Resp 16 | Wt 207.0 lb

## 2017-08-08 DIAGNOSIS — I10 Essential (primary) hypertension: Secondary | ICD-10-CM | POA: Diagnosis not present

## 2017-08-08 DIAGNOSIS — E1151 Type 2 diabetes mellitus with diabetic peripheral angiopathy without gangrene: Secondary | ICD-10-CM | POA: Diagnosis not present

## 2017-08-08 DIAGNOSIS — Z23 Encounter for immunization: Secondary | ICD-10-CM | POA: Diagnosis not present

## 2017-08-08 LAB — POCT UA - MICROALBUMIN: Microalbumin Ur, POC: 20 mg/L

## 2017-08-08 LAB — POCT GLYCOSYLATED HEMOGLOBIN (HGB A1C): HEMOGLOBIN A1C: 6.5

## 2017-08-08 NOTE — Progress Notes (Signed)
Joseph [name]  MRN: [id number] DOB: 03-10-1925  Subjective:  HPI   The patient is an 83 year old male who presents for follow up of chronic issues.  He was last seen in July for an acute visit with one of the PA's in the office.  His last A1C was on 01/22/17 and was 7.0.  The patient has been checking his glucose at home and has been getting readings ranging 80's-220's. He has not had any symptoms suggestive of hypoglycemia.  He is due for a urine micro-albumin today.  no anginal symptoms or claudication. He is also here to follow up on his hypertension.  He checks his blood pressure outside of the office and states that the readings have been good but does not recall the actual numbers.  Patient Active Problem List   Diagnosis Date Noted  . Acute cholecystitis   . Abdominal pain   . Calculus of gallbladder with acute cholecystitis without obstruction   . Diverticulitis of large intestine without perforation or abscess without bleeding   . Pain of upper abdomen   . SOB (shortness of breath)   . S/P CABG (coronary artery bypass graft)   . Morbid obesity due to excess calories ([place])   . Acute respiratory failure with hypoxia ([place]) 03/21/2016  . Back pain, chronic 07/28/2015  . Benign fibroma of prostate 07/28/2015  . Acid reflux 07/28/2015  . Adult hypothyroidism 07/28/2015  . Eunuchoidism 07/28/2015  . Neuropathy 07/28/2015  . Arthritis, degenerative 07/28/2015  . Psoriasis 07/28/2015  . Obesity 09/09/2014  . Bilateral leg weakness 09/09/2014  . Diastolic CHF, acute on chronic ([place]) 05/13/2014  . Bilateral leg edema 04/15/2014  . Bradycardia 08/13/2013  . Edema 06/21/2012  . Fatigue 09/15/2011  . Diabetes mellitus with peripheral vascular disease ([place]) 09/15/2011  . DYSPNEA 06/09/2009  . Hyperlipidemia 06/07/2009  . HYPERTENSION, BENIGN 06/07/2009  . CAD, ARTERY BYPASS GRAFT 06/07/2009    Past Medical History:  Diagnosis Date  . CAD (coronary artery disease)    a.  s/p CABG;  b. 07/2012 Cath: 3VD including 80 dLCX (med Rx), 4/4 patent grafts.  . Chronic diastolic CHF (congestive heart failure) ([place])    a. 07/2012 Echo: EF 55-60%, no rwma, Gr 1 DD, mild MR;  04/2014 Echo: EF 55-60%, no rwma, mild MR, mildly dil LA.  . Diabetes mellitus, type 2 ([place])   . HTN (hypertension)   . Hyperlipidemia   . Hypothyroidism   . Lower extremity edema    a. 03/2014 amlodipine d/c'd.  Marland Kitchen Spinal stenosis     Social History   Social History  . Marital status: Married    Spouse name: N/A  . Number of children: N/A  . Years of education: N/A   Occupational History  . Not on file.   Social History Main Topics  . Smoking status: Former Smoker    Packs/day: 0.00    Years: 6.00    Types: Pipe, Cigars    Quit date: 12/23/1980  . Smokeless tobacco: Never Used  . Alcohol use No  . Drug use: No  . Sexual activity: No   Other Topics Concern  . Not on file   Social History Narrative   Retired.     Outpatient Encounter Prescriptions as of 08/08/2017  Medication Sig  . amLODipine (NORVASC) 10 MG tablet Take 1 tablet (10 mg total) by mouth daily.  Marland Kitchen aspirin EC 81 MG tablet Take 1 tablet (81 mg total) by mouth daily.  Marland Kitchen atorvastatin (  LIPITOR) 40 MG tablet Take 1 tablet (40 mg total) by mouth daily.  . blood glucose meter kit and supplies KIT Dispense Accuchek Aviva glucometer with 120 test strips.  Test glucose level 3 times per day with meals and at bedtime.  Dispense based on patient and insurance preference. Use up to four times daily as directed. (FOR ICD-9 250.00, 250.01, ICD 10 E11.9).  . calcipotriene (DOVONOX) 0.005 % cream Apply topically 2 (two) times daily.  . Cholecalciferol (VITAMIN D3) 2000 units TABS Take 1 capsule by mouth daily.  . clobetasol cream (TEMOVATE) 7.89 % Apply 1 application topically 2 (two) times daily.  Marland Kitchen glucose blood (ONE TOUCH ULTRA TEST) test strip Check sugar 4 times daily DX:E 11.51  . hydrocortisone 2.5 % cream Apply topically 2  (two) times daily.  . insulin aspart (NOVOLOG) 100 UNIT/ML injection Inject 0-9 Units into the skin 3 (three) times daily with meals. (Patient taking differently: Inject 0-9 Units into the skin as needed. 26 am, 16 lunch, 28 pm)  . insulin glargine (LANTUS) 100 UNIT/ML injection Inject 0.68 mLs (68 Units total) into the skin at bedtime. (Patient taking differently: Inject 55 Units into the skin at bedtime. 16 am and 65 at bedtime)  . ketoconazole (NIZORAL) 2 % cream Apply 1 application topically 2 (two) times daily as needed for irritation.  Marland Kitchen KLOR-CON M10 10 MEQ tablet TAKE ONE TABLET BY MOUTH TWICE DAILY  . levothyroxine (SYNTHROID, LEVOTHROID) 50 MCG tablet TAKE ONE TABLET BY MOUTH ONCE DAILY BEFORE BREAKFAST  . losartan (COZAAR) 100 MG tablet Take 1 tablet (100 mg total) by mouth daily.  . Magnesium 250 MG TABS Take by mouth daily as needed.  . mometasone (ELOCON) 0.1 % ointment Apply topically daily.  . nitroGLYCERIN (NITROSTAT) 0.4 MG SL tablet Place 1 tablet (0.4 mg total) under the tongue every 5 (five) minutes as needed for chest pain.  Marland Kitchen omeprazole (PRILOSEC) 20 MG capsule TAKE 1 CAPSULE BY MOUTH ONCE DAILY  . ONETOUCH DELICA LANCETS 38B MISC Check sugar 4 times daily. DX E11.51  . torsemide (DEMADEX) 20 MG tablet Take 20 mg by mouth daily as needed.  . vitamin C (ASCORBIC ACID) 500 MG tablet Take 500 mg by mouth daily.  . [DISCONTINUED] potassium chloride (K-DUR,KLOR-CON) 10 MEQ tablet Take 10 mEq by mouth daily as needed.  . [DISCONTINUED] isosorbide mononitrate (IMDUR) 30 MG 24 hr tablet Take 1 tablet (30 mg total) by mouth daily.   No facility-administered encounter medications on file as of 08/08/2017.     No Known Allergies  Review of Systems  Constitutional: Negative for fever and malaise/fatigue.  HENT: Negative.   Eyes: Negative.   Respiratory: Negative for cough, shortness of breath and wheezing.   Cardiovascular: Negative for chest pain, palpitations, orthopnea and leg  swelling.  Gastrointestinal: Negative.   Genitourinary: Negative for frequency.  Skin: Negative.   Neurological: Negative for weakness.  Endo/Heme/Allergies: Negative.  Negative for polydipsia.  Psychiatric/Behavioral: Negative.     Objective:  BP (!) 118/58 (BP Location: Right Arm, Patient Position: Sitting, Cuff Size: Normal)   Pulse 60   Temp 98.1 F (36.7 C) (Oral)   Resp 16   Wt 207 lb (93.9 kg)   BMI 33.41 kg/m   Physical Exam  Constitutional: He is oriented to person, place, and time and well-developed, well-nourished, and in no distress.  HENT:  Head: Normocephalic and atraumatic.  Right Ear: External ear normal.  Left Ear: External ear normal.  Nose: Nose normal.  Eyes: Conjunctivae are normal. No scleral icterus.  Neck: No thyromegaly present.  Cardiovascular: Normal rate, regular rhythm and normal heart sounds.   Pulmonary/Chest: Effort normal and breath sounds normal.  Abdominal: Soft.  Neurological: He is alert and oriented to person, place, and time. Gait normal. GCS score is 15.  Skin: Skin is warm and dry.  Psychiatric: Mood, memory, affect and judgment normal.    Assessment and Plan :  TIIDM  control with A1c at 6.5 today.microalbumin also today.  CAD  all risk factors treated  hypertension  hyperlipidemia  Repeat labs on next visit. They were done 6 months ago.  I have done the exam and reviewed the chart and it is accurate to the best of my knowledge. Development worker, community has been used and  any errors in dictation or transcription are unintentional. Miguel Aschoff M.D. [facility]

## 2017-08-16 DIAGNOSIS — E1159 Type 2 diabetes mellitus with other circulatory complications: Secondary | ICD-10-CM | POA: Diagnosis not present

## 2017-08-16 DIAGNOSIS — Z794 Long term (current) use of insulin: Secondary | ICD-10-CM | POA: Diagnosis not present

## 2017-10-16 ENCOUNTER — Other Ambulatory Visit: Payer: Self-pay | Admitting: Cardiovascular Disease

## 2017-10-16 MED ORDER — TORSEMIDE 20 MG PO TABS: 20.0000 mg | ORAL_TABLET | Freq: Every day | ORAL | 4 refills | Status: DC | PRN

## 2017-10-16 NOTE — Telephone Encounter (Signed)
Requested Prescriptions   Signed Prescriptions Disp Refills  . torsemide (DEMADEX) 20 MG tablet 90 tablet 4    Sig: Take 1 tablet (20 mg total) by mouth daily as needed.    Authorizing Provider: Minna Merritts    Ordering User: Janan Ridge   Refused Prescriptions Disp Refills  . torsemide (DEMADEX) 20 MG tablet [Pharmacy Med Name: TORSEMIDE 20MG       TAB] 90 tablet 4    Sig: TAKE ONE TABLET BY MOUTH ONCE DAILY    Refused By: Janan Ridge    Reason for Refusal: Request already responded to by other means (e.g. phone or fax)

## 2017-11-07 DIAGNOSIS — B351 Tinea unguium: Secondary | ICD-10-CM | POA: Diagnosis not present

## 2017-11-07 DIAGNOSIS — E1042 Type 1 diabetes mellitus with diabetic polyneuropathy: Secondary | ICD-10-CM | POA: Diagnosis not present

## 2017-11-24 ENCOUNTER — Telehealth: Payer: Self-pay | Admitting: Family Medicine

## 2017-11-24 NOTE — Telephone Encounter (Signed)
Patient would like to get a RX Free style Continous Glucose Monitor to [facility] on [address].

## 2017-11-27 ENCOUNTER — Other Ambulatory Visit: Payer: Self-pay

## 2017-11-27 DIAGNOSIS — E1151 Type 2 diabetes mellitus with diabetic peripheral angiopathy without gangrene: Secondary | ICD-10-CM

## 2017-11-27 MED ORDER — FREESTYLE SYSTEM KIT: 1.0000 | PACK | 0 refills | Status: DC | PRN

## 2017-12-04 ENCOUNTER — Other Ambulatory Visit: Payer: Self-pay | Admitting: Cardiovascular Disease

## 2017-12-04 ENCOUNTER — Telehealth: Payer: Self-pay | Admitting: Family Medicine

## 2017-12-04 DIAGNOSIS — E1151 Type 2 diabetes mellitus with diabetic peripheral angiopathy without gangrene: Secondary | ICD-10-CM

## 2017-12-04 MED ORDER — FREESTYLE LIBRE SENSOR SYSTEM MISC
1.0000 | 1 refills | Status: DC | PRN
Start: 2017-12-04 — End: 2018-02-01

## 2017-12-04 NOTE — Telephone Encounter (Signed)
[facility] had contacted [place] about this Dr. Darnell Level signed and sent it back, but I sent orders through computer for this as well.

## 2017-12-04 NOTE — Telephone Encounter (Signed)
Patient states that you sent in a prescription for a Freestyle libre system continuous glucose monitor.  He states that the pharmacy said that you need to send in a prescription for 14 day sensors, 90 day supply for this monitor.  He uses [facility].

## 2017-12-12 ENCOUNTER — Telehealth: Payer: Self-pay | Admitting: Cardiovascular Disease

## 2017-12-12 NOTE — Telephone Encounter (Signed)
Spoke with patients wife per release form and she states that patient has gone to doctor appointment at the [facility] hospital. She reports that he has some swelling to bilateral feet and lower legs. Reviewed medications and she stated that they were calling for instructions and sooner appointment. Instructed her to have him wear compression socks/hose, elevate feet when sitting, and to take torsemide. She reports that he has not taken it today due to doctor appointment. Instructed her to have him take it for 3 days to see if that will help reduce swelling and if we should have cancellation we would give her a call. She was appreciative for the call back with no further questions at this time.

## 2017-12-12 NOTE — Progress Notes (Signed)
Cardiology Office Note  Date:  12/13/2017   ID:  Joseph [name], DOB 05/07/1925, MRN [id number]  PCP:  [name]., MD   Chief Complaint  Patient presents with  . OTHER    Edema. Meds reviewed verbally with pt.    HPI:  Joseph [name] is a 84 year old gentleman with a hx of  CAD, CABG in 1994,  PAD ,followed by Dr. Lucky Cowboy,  obesity,  hyperlipidemia,  arthritis of the knees,   fatigue, SOB, leg weakness,  lower extremity edema,  Carotid u/s  Less than 39% stenosis bilaterally presenting for routine followup of his coronary artery disease and history of acute on chronic diastolic CHF.   In follow-up today he reports that blood pressure numbers are up and down Mostly better on his current regimen  Weight is up significantly over the past 6 months Up 16 pounds, reports overeating around the holidays Dramatic lower extremity swelling, blistering, tightness Difficulty bending his legs, tight behind the knees Denies having significant PND orthopnea  No regular exercise, minimally active  HBA1C 7.0 Total chol 138, LDL 77  Recent lab work reviewed with him in detail Recent lab work with endocrinology, normal kidney function Low normal potassium  EKG personally reviewed by myself on todays visit  shows normal sinus rhythm rate 60 bpm intraventricular conduction delay, left anterior fascicular block  Other past medical history reviewed  headache 02/03/2017,  Seen in th ER for H/A 02/03/17 Confused,  In the emergency room had CT head, Showing atherosclerotic calcification of the cavernous carotid arteries bilaterally. Lab work within normal limits No other focal neurologic deficits Patient and family thinks he had a TIA  In follow up visit was still having  headaches in the afternoon Reports having arthritis in his neck  Was told by orthopedics he had Spinal stenosis? Recommended to have cortisone injections Legs get "heavy" with walking, sits 2 minutes   Does not feel that he needs injections  admission May 2017 for acute cholecystitis, developing acute respiratory distress with hypoxia and acute renal disease, peak creatinine of 2. Notes indicating HIDA scan positive for acute cholecystitis. s/p cholecystostomy tube placed in through interventional radiology 03/24/16 Diuretic was discontinued in the setting of acute renal failure He does report greater than 20 pound weight loss  Echocardiogram 03/22/2016 reviewed with him, ejection fraction 50-55% Chest x-ray 03/29/2016 with small right pleural effusion  Ultrasound of the legs showing patent right SFA stent, left side CFA and popliteal artery with good flow, waveform deterioration around the ankle on the left  Last Cardiac catheterization was performed at [facility] 08/16/2012 Catheterization showed severe three-vessel coronary artery disease with patent grafts x4. There was 80% disease of a small to moderate sized distal left circumflex. Ejection fraction 45-50%. No significant aortic valve stenosis or mitral valve regurgitation. The findings were discussed with interventional cardiology and medical management was recommended for the distal left circumflex disease.  previous Echocardiogram did not show elevated right-sided pressures and ejection fraction was normal. Previous episode of tachycardia in June 2013 that resolved without intervention  Total cholesterol 143, LDL 82  PMH:   has a past medical history of CAD (coronary artery disease), Chronic diastolic CHF (congestive heart failure) ([place]), Diabetes mellitus, type 2 ([place]), HTN (hypertension), Hyperlipidemia, Hypothyroidism, Lower extremity edema, and Spinal stenosis.  PSH:    Past Surgical History:  Procedure Laterality Date  . APPENDECTOMY    . CARDIAC CATHETERIZATION  07/2012   [facility]/no stents  . CHOLECYSTECTOMY    .  CORNEAL TRANSPLANT    . CORONARY ARTERY BYPASS GRAFT  1994  . HEMORRHOID SURGERY    . PROSTATE BIOPSY    .  TOE SURGERY     ingrown toe nail    Current Outpatient Medications  Medication Sig Dispense Refill  . amLODipine (NORVASC) 10 MG tablet Take 1 tablet (10 mg total) by mouth daily. 90 tablet 3  . aspirin EC 81 MG tablet Take 1 tablet (81 mg total) by mouth daily. 90 tablet 3  . atorvastatin (LIPITOR) 40 MG tablet TAKE ONE TABLET BY MOUTH ONCE DAILY 90 tablet 3  . blood glucose meter kit and supplies KIT Dispense Accuchek Aviva glucometer with 120 test strips.  Test glucose level 3 times per day with meals and at bedtime.  Dispense based on patient and insurance preference. Use up to four times daily as directed. (FOR ICD-9 250.00, 250.01, ICD 10 E11.9). 1 each 0  . calcipotriene (DOVONOX) 0.005 % cream Apply topically 2 (two) times daily.    . Cholecalciferol (VITAMIN D3) 2000 units TABS Take 1 capsule by mouth daily.    . clobetasol cream (TEMOVATE) 1.51 % Apply 1 application topically 2 (two) times daily.    . Continuous Blood Gluc Sensor ([place]) MISC 1 each by Does not apply route as needed. 90 each 1  . glucose monitoring kit (FREESTYLE) monitoring kit 1 each by Does not apply route as needed for other. 1 each 0  . hydrocortisone 2.5 % cream Apply topically 2 (two) times daily.    . insulin aspart (NOVOLOG) 100 UNIT/ML injection Inject 0-9 Units into the skin 3 (three) times daily with meals. (Patient taking differently: Inject 0-9 Units into the skin as needed. 26 am, 16 lunch, 28 pm) 10 mL 0  . insulin glargine (LANTUS) 100 UNIT/ML injection Inject 0.68 mLs (68 Units total) into the skin at bedtime. (Patient taking differently: Inject 55 Units into the skin at bedtime. 16 am and 65 at bedtime) 10 mL 0  . ketoconazole (NIZORAL) 2 % cream Apply 1 application topically 2 (two) times daily as needed for irritation.    Marland Kitchen KLOR-CON M10 10 MEQ tablet TAKE ONE TABLET BY MOUTH TWICE DAILY 180 tablet 3  . levothyroxine (SYNTHROID, LEVOTHROID) 50 MCG tablet TAKE ONE TABLET BY  MOUTH ONCE DAILY BEFORE BREAKFAST 90 tablet 1  . losartan (COZAAR) 100 MG tablet Take 1 tablet (100 mg total) by mouth daily. 90 tablet 3  . Magnesium 250 MG TABS Take by mouth daily as needed.    . mometasone (ELOCON) 0.1 % ointment Apply topically daily.    . nitroGLYCERIN (NITROSTAT) 0.4 MG SL tablet Place 1 tablet (0.4 mg total) under the tongue every 5 (five) minutes as needed for chest pain. 25 tablet 3  . omeprazole (PRILOSEC) 20 MG capsule TAKE 1 CAPSULE BY MOUTH ONCE DAILY 90 capsule 3  . torsemide (DEMADEX) 20 MG tablet Take 1 tablet (20 mg total) by mouth daily as needed. 90 tablet 4  . vitamin C (ASCORBIC ACID) 500 MG tablet Take 500 mg by mouth daily.     No current facility-administered medications for this visit.      Allergies:   Patient has no known allergies.   Social History:  The patient  reports that he quit smoking about 36 years ago. His smoking use included pipe and cigars. He smoked 0.00 packs per day for 6.00 years. he has never used smokeless tobacco. He reports that he does not drink  alcohol or use drugs.   Family History:   family history includes Heart attack in his father; Heart failure in his maternal grandfather, maternal uncle, and mother; Stroke in his brother.    Review of Systems: Review of Systems  Constitutional: Negative.        Weight gain  Respiratory: Negative.   Cardiovascular: Positive for leg swelling.  Gastrointestinal: Negative.   Musculoskeletal: Negative.   Neurological: Negative.   Psychiatric/Behavioral: Negative.   All other systems reviewed and are negative.    PHYSICAL EXAM: VS:  BP (!) 138/52 (BP Location: Left Arm, Patient Position: Sitting, Cuff Size: Normal)   Pulse 60   Ht 5' 7"  (1.702 m)   Wt 215 lb 4 oz (97.6 kg)   BMI 33.71 kg/m  , BMI Body mass index is 33.71 kg/m. GEN: Well nourished, well developed, in no acute distress,  obese HEENT: normal  Neck: no JVD, carotid bruits, or masses Cardiac: RRR; no murmurs,  rubs, or gallops,no edema  Respiratory:  clear to auscultation bilaterally, normal work of breathing GI: soft, nontender, nondistended, + BS MS: no deformity or atrophy  Skin: warm and dry, no rash Neuro:  Strength and sensation are intact Psych: euthymic mood, full affect    Recent Labs: 02/03/2017: ALT 18; Hemoglobin 13.6; Platelets 180 03/17/2017: BUN 21; Creatinine, Ser 1.07; Potassium 4.2; Sodium 143    Lipid Panel Lab Results  Component Value Date   CHOL 138 12/06/2016   HDL 44 12/06/2016   LDLCALC 77 12/06/2016   TRIG 85 12/06/2016      Wt Readings from Last 3 Encounters:  12/13/17 215 lb 4 oz (97.6 kg)  08/08/17 207 lb (93.9 kg)  06/05/17 200 lb (90.7 kg)       ASSESSMENT AND PLAN:  Mixed hyperlipidemia -  Cholesterol is at goal on the current lipid regimen. No changes to the medications were made. stable  HYPERTENSION, BENIGN -  Blood pressure is well controlled on today's visit. No changes made to the medications.  Atherosclerosis of coronary artery bypass graft of native heart with stable angina pectoris ([place]) -  Currently with no symptoms of angina. No further workup at this time. Continue current medication regimen.  Diabetes mellitus with peripheral vascular disease ([place]) -  We have encouraged continued exercise, careful diet management in an effort to lose weight.  Has follow-up with endocrinology next week  Diastolic CHF, acute on chronic ([facility]) -  Worsening lower extremity edema Likely secondary to dietary non-discretion High fluid intake daily, weight up 15 pounds from 6 months ago. Recommended he increase torsemide up to 40 mg twice daily for 3 days then down to 20 mg twice daily Suggested he decrease his fluid intake If edema does not improve, may need to decrease his amlodipine dosing  SOB (shortness of breath) -  Chronic diastolic CHF now with 15 pound weight gain worsening leg edema Denies excessive shortness of breath but he is  sedentary No PND orthopnea  Morbid obesity due to excess calories ([facility]) -  Again recommended low carbohydrate diet Unable to exercise  Bilateral leg edema -  As above, leg swelling has returned High fluid intake, dietary noncompliance Torsemide 40 twice daily for 3 days then down to 20 twice daily  extra potassium  PAD (peripheral artery disease) ([place]) -  Stressed importance of aggressive diabetes control, low cholesterol   Total encounter time more than 25 minutes  Greater than 50% was spent in counseling and coordination of care with the  patient   Disposition:   F/U 1 month   Orders Placed This Encounter  Procedures  . EKG 12-Lead     Signed, Esmond Plants, M.D., Ph.D. 12/13/2017  [place], [place]

## 2017-12-12 NOTE — Telephone Encounter (Signed)
Patient is scheduled 2/6 Would like to be seen sooner Having issues with swelling in legs and feet Please call

## 2017-12-13 ENCOUNTER — Encounter: Payer: Self-pay | Admitting: Cardiovascular Disease

## 2017-12-13 ENCOUNTER — Ambulatory Visit: Payer: PPO | Admitting: Cardiovascular Disease

## 2017-12-13 VITALS — BP 138/52 | HR 60 | Ht 67.0 in | Wt 215.2 lb

## 2017-12-13 DIAGNOSIS — I5033 Acute on chronic diastolic (congestive) heart failure: Secondary | ICD-10-CM | POA: Diagnosis not present

## 2017-12-13 DIAGNOSIS — I25708 Atherosclerosis of coronary artery bypass graft(s), unspecified, with other forms of angina pectoris: Secondary | ICD-10-CM

## 2017-12-13 DIAGNOSIS — R600 Localized edema: Secondary | ICD-10-CM | POA: Diagnosis not present

## 2017-12-13 DIAGNOSIS — Z951 Presence of aortocoronary bypass graft: Secondary | ICD-10-CM

## 2017-12-13 DIAGNOSIS — R0602 Shortness of breath: Secondary | ICD-10-CM

## 2017-12-13 DIAGNOSIS — E1151 Type 2 diabetes mellitus with diabetic peripheral angiopathy without gangrene: Secondary | ICD-10-CM | POA: Diagnosis not present

## 2017-12-13 DIAGNOSIS — I10 Essential (primary) hypertension: Secondary | ICD-10-CM | POA: Diagnosis not present

## 2017-12-13 DIAGNOSIS — E782 Mixed hyperlipidemia: Secondary | ICD-10-CM

## 2017-12-13 DIAGNOSIS — E1159 Type 2 diabetes mellitus with other circulatory complications: Secondary | ICD-10-CM | POA: Diagnosis not present

## 2017-12-13 MED ORDER — TORSEMIDE 20 MG PO TABS: 20.0000 mg | ORAL_TABLET | Freq: Two times a day (BID) | ORAL | 3 refills | Status: DC

## 2017-12-13 MED ORDER — POTASSIUM CHLORIDE CRYS ER 15 MEQ PO TBCR: 15.0000 meq | EXTENDED_RELEASE_TABLET | Freq: Two times a day (BID) | ORAL | 3 refills | Status: DC

## 2017-12-13 NOTE — Patient Instructions (Addendum)
Medication Instructions:   Please increase the torsemide up to 40 mg twice a day for three days 2 pills in the AM and 2 pills at 2 pm Take with four potassium pills  Then after three days, Take one pill twice a day at 8:30 and and 2 pm Take with two potassium pills   Labwork:  No new labs needed  Testing/Procedures:  No further testing at this time   Follow-Up: It was a pleasure seeing you in the office today. Please call us if you have new issues that need to be addressed before your next appt.  [phone]  Your physician wants you to follow-up in: 1 month.    If you need a refill on your cardiac medications before your next appointment, please call your pharmacy.

## 2017-12-14 ENCOUNTER — Ambulatory Visit (INDEPENDENT_AMBULATORY_CARE_PROVIDER_SITE_OTHER): Payer: PPO | Admitting: Family Medicine

## 2017-12-14 ENCOUNTER — Other Ambulatory Visit: Payer: Self-pay

## 2017-12-14 VITALS — BP 122/58 | HR 60 | Resp 16 | Wt 206.0 lb

## 2017-12-14 DIAGNOSIS — I25708 Atherosclerosis of coronary artery bypass graft(s), unspecified, with other forms of angina pectoris: Secondary | ICD-10-CM

## 2017-12-14 DIAGNOSIS — N419 Inflammatory disease of prostate, unspecified: Secondary | ICD-10-CM

## 2017-12-14 DIAGNOSIS — I5033 Acute on chronic diastolic (congestive) heart failure: Secondary | ICD-10-CM | POA: Diagnosis not present

## 2017-12-14 MED ORDER — DOXYCYCLINE HYCLATE 100 MG PO TABS: 100.0000 mg | ORAL_TABLET | Freq: Two times a day (BID) | ORAL | 0 refills | Status: DC

## 2017-12-14 NOTE — Progress Notes (Signed)
Joseph [name]  MRN: [id number] DOB: 1925-10-07  Subjective:  HPI  The patient is a 84 year old male who made this appointment because of lower extremity edema.  However he saw his cardiologist yesterday and he was instructed to double up on the his Torsemide, Magnesium and Potassium for 3 days.  He will have follow up next week dependent on how he is doing.  The patient states that his weight at their office yesterday was 215 and today we have him at 206.   Since the patient has had his swelling addressed by cardiology he kept the appointment today because he is having pain in his left lower back that radiates to the front pelvis and down into the testes.  He states that he is having testicular pain with it. The pain started 2 weeks ago.  Patient Active Problem List   Diagnosis Date Noted  . Acute cholecystitis   . Abdominal pain   . Calculus of gallbladder with acute cholecystitis without obstruction   . Diverticulitis of large intestine without perforation or abscess without bleeding   . Pain of upper abdomen   . SOB (shortness of breath)   . S/P CABG (coronary artery bypass graft)   . Morbid obesity due to excess calories ([place])   . Acute respiratory failure with hypoxia ([place]) 03/21/2016  . Back pain, chronic 07/28/2015  . Benign fibroma of prostate 07/28/2015  . Acid reflux 07/28/2015  . Adult hypothyroidism 07/28/2015  . Eunuchoidism 07/28/2015  . Neuropathy 07/28/2015  . Arthritis, degenerative 07/28/2015  . Psoriasis 07/28/2015  . Obesity 09/09/2014  . Bilateral leg weakness 09/09/2014  . Diastolic CHF, acute on chronic ([place]) 05/13/2014  . Bilateral leg edema 04/15/2014  . Bradycardia 08/13/2013  . Edema 06/21/2012  . Fatigue 09/15/2011  . Diabetes mellitus with peripheral vascular disease ([place]) 09/15/2011  . DYSPNEA 06/09/2009  . Hyperlipidemia 06/07/2009  . HYPERTENSION, BENIGN 06/07/2009  . CAD, ARTERY BYPASS GRAFT 06/07/2009    Past Medical History:    Diagnosis Date  . CAD (coronary artery disease)    a. s/p CABG;  b. 07/2012 Cath: 3VD including 80 dLCX (med Rx), 4/4 patent grafts.  . Chronic diastolic CHF (congestive heart failure) ([place])    a. 07/2012 Echo: EF 55-60%, no rwma, Gr 1 DD, mild MR;  04/2014 Echo: EF 55-60%, no rwma, mild MR, mildly dil LA.  . Diabetes mellitus, type 2 ([place])   . HTN (hypertension)   . Hyperlipidemia   . Hypothyroidism   . Lower extremity edema    a. 03/2014 amlodipine d/c'd.  Marland Kitchen Spinal stenosis     Social History   Socioeconomic History  . Marital status: Married    Spouse name: Not on file  . Number of children: Not on file  . Years of education: Not on file  . Highest education level: Not on file  Social Needs  . Financial resource strain: Not on file  . Food insecurity - worry: Not on file  . Food insecurity - inability: Not on file  . Transportation needs - medical: Not on file  . Transportation needs - non-medical: Not on file  Occupational History  . Not on file  Tobacco Use  . Smoking status: Former Smoker    Packs/day: 0.00    Years: 6.00    Pack years: 0.00    Types: Pipe, Cigars    Last attempt to quit: 12/23/1980    Years since quitting: 37.0  . Smokeless tobacco: Never Used  Substance and Sexual Activity  . Alcohol use: No  . Drug use: No  . Sexual activity: No  Other Topics Concern  . Not on file  Social History Narrative   Retired.     Outpatient Encounter Medications as of 12/14/2017  Medication Sig  . amLODipine (NORVASC) 10 MG tablet Take 1 tablet (10 mg total) by mouth daily.  Marland Kitchen aspirin EC 81 MG tablet Take 1 tablet (81 mg total) by mouth daily.  Marland Kitchen atorvastatin (LIPITOR) 40 MG tablet TAKE ONE TABLET BY MOUTH ONCE DAILY  . blood glucose meter kit and supplies KIT Dispense Accuchek Aviva glucometer with 120 test strips.  Test glucose level 3 times per day with meals and at bedtime.  Dispense based on patient and insurance preference. Use up to four times daily as  directed. (FOR ICD-9 250.00, 250.01, ICD 10 E11.9).  . calcipotriene (DOVONOX) 0.005 % cream Apply topically 2 (two) times daily.  . Cholecalciferol (VITAMIN D3) 2000 units TABS Take 1 capsule by mouth daily.  . clobetasol cream (TEMOVATE) 2.26 % Apply 1 application topically 2 (two) times daily.  . Continuous Blood Gluc Sensor ([place]) MISC 1 each by Does not apply route as needed.  Marland Kitchen glucose monitoring kit (FREESTYLE) monitoring kit 1 each by Does not apply route as needed for other.  . hydrocortisone 2.5 % cream Apply topically 2 (two) times daily.  . insulin aspart (NOVOLOG) 100 UNIT/ML injection Inject 0-9 Units into the skin 3 (three) times daily with meals. (Patient taking differently: Inject 0-9 Units into the skin as needed. 26 am, 16 lunch, 28 pm)  . insulin glargine (LANTUS) 100 UNIT/ML injection Inject 0.68 mLs (68 Units total) into the skin at bedtime. (Patient taking differently: Inject 55 Units into the skin at bedtime. 16 am and 65 at bedtime)  . ketoconazole (NIZORAL) 2 % cream Apply 1 application topically 2 (two) times daily as needed for irritation.  Marland Kitchen levothyroxine (SYNTHROID, LEVOTHROID) 50 MCG tablet TAKE ONE TABLET BY MOUTH ONCE DAILY BEFORE BREAKFAST  . losartan (COZAAR) 100 MG tablet Take 1 tablet (100 mg total) by mouth daily.  . Magnesium 250 MG TABS Take by mouth daily as needed.  . mometasone (ELOCON) 0.1 % ointment Apply topically daily.  . nitroGLYCERIN (NITROSTAT) 0.4 MG SL tablet Place 1 tablet (0.4 mg total) under the tongue every 5 (five) minutes as needed for chest pain.  Marland Kitchen omeprazole (PRILOSEC) 20 MG capsule TAKE 1 CAPSULE BY MOUTH ONCE DAILY  . potassium chloride SA (KLOR-CON M15) 15 MEQ tablet Take 1 tablet (15 mEq total) by mouth 2 (two) times daily.  Marland Kitchen torsemide (DEMADEX) 20 MG tablet Take 1 tablet (20 mg total) by mouth 2 (two) times daily.  . vitamin C (ASCORBIC ACID) 500 MG tablet Take 500 mg by mouth daily.   No  facility-administered encounter medications on file as of 12/14/2017.     No Known Allergies  Review of Systems  Constitutional: Negative for chills, fever and malaise/fatigue.  HENT: Negative.   Eyes: Negative.   Respiratory: Negative for cough, shortness of breath and wheezing.   Cardiovascular: Positive for leg swelling. Negative for chest pain, palpitations and claudication.  Gastrointestinal: Negative.   Genitourinary: Positive for flank pain. Negative for dysuria, hematuria and urgency. Frequency: secondary to recent increase in diuretic.   Skin: Negative.   Neurological: Negative for weakness.  Endo/Heme/Allergies: Negative.   Psychiatric/Behavioral: Negative.     Objective:  BP (!) 122/58 (BP Location: Right Arm,  Patient Position: Sitting, Cuff Size: Normal)   Pulse 60   Resp 16   Wt 206 lb (93.4 kg)   BMI 32.26 kg/m   Physical Exam  Constitutional: He is oriented to person, place, and time and well-developed, well-nourished, and in no distress.  HENT:  Head: Normocephalic and atraumatic.  Right Ear: External ear normal.  Left Ear: External ear normal.  Nose: Nose normal.  Eyes: Conjunctivae are normal.  Neck: Neck supple. No thyromegaly present.  Cardiovascular: Normal rate and regular rhythm.  Pulmonary/Chest: Effort normal and breath sounds normal.  Abdominal: Soft.  Genitourinary: Penis normal.  Genitourinary Comments: Testicles normal,no hernia.  Lymphadenopathy:    He has no cervical adenopathy.  Neurological: He is alert and oriented to person, place, and time. Gait normal. GCS score is 15.  Skin: Skin is warm and dry.  Psychiatric: Mood, memory, affect and judgment normal.    Assessment and Plan :  1. Prostatitis, unspecified prostatitis type RTC next month. - doxycycline (VIBRA-TABS) 100 MG tablet; Take 1 tablet (100 mg total) by mouth 2 (two) times daily.  Dispense: 20 tablet; Refill: 0 2.Fluid Overload/CHF Improved with cardiology 3.CAD  I have  done the exam and reviewed the chart and it is accurate to the best of my knowledge. Development worker, community has been used and  any errors in dictation or transcription are unintentional. Miguel Aschoff M.D. [facility]

## 2017-12-19 DIAGNOSIS — R809 Proteinuria, unspecified: Secondary | ICD-10-CM | POA: Diagnosis not present

## 2017-12-19 DIAGNOSIS — E1159 Type 2 diabetes mellitus with other circulatory complications: Secondary | ICD-10-CM | POA: Diagnosis not present

## 2017-12-19 DIAGNOSIS — E1129 Type 2 diabetes mellitus with other diabetic kidney complication: Secondary | ICD-10-CM | POA: Diagnosis not present

## 2017-12-19 DIAGNOSIS — Z794 Long term (current) use of insulin: Secondary | ICD-10-CM | POA: Diagnosis not present

## 2017-12-19 DIAGNOSIS — E1165 Type 2 diabetes mellitus with hyperglycemia: Secondary | ICD-10-CM | POA: Diagnosis not present

## 2017-12-22 ENCOUNTER — Telehealth: Payer: Self-pay | Admitting: Cardiovascular Disease

## 2017-12-22 NOTE — Telephone Encounter (Signed)
Patient saw Dr Rockey Situ on 1/23 The medication given has been working and the swelling has gone down quite a bit Patient states he is doing good and no longer feels the 2/6 appt is necessary Wanted nurse to be aware

## 2017-12-25 NOTE — Telephone Encounter (Signed)
Spoke with patient and he states that Dr. Rockey Situ advised him to cancel appointment if he was doing better and swelling decreased. He reports that he thinks everything is much better and wanted to delay follow up at this time. Advised that I would have someone reach out to schedule 3 month follow up with Dr. Rockey Situ and he was agreeable with this plan. He had no further questions or concerns at this time.

## 2017-12-25 NOTE — Telephone Encounter (Signed)
Lmov for patient to call and schedule 20m appointment with Dr Rockey Situ

## 2017-12-26 NOTE — Telephone Encounter (Signed)
Pt scheduled 03/26/18 to see Dr Rockey Situ  Nothing else needed.

## 2017-12-27 ENCOUNTER — Ambulatory Visit: Payer: PPO | Admitting: Cardiovascular Disease

## 2018-01-08 ENCOUNTER — Ambulatory Visit (INDEPENDENT_AMBULATORY_CARE_PROVIDER_SITE_OTHER): Payer: PPO

## 2018-01-08 ENCOUNTER — Encounter: Payer: Self-pay | Admitting: Family Medicine

## 2018-01-08 ENCOUNTER — Ambulatory Visit (INDEPENDENT_AMBULATORY_CARE_PROVIDER_SITE_OTHER): Payer: PPO | Admitting: Family Medicine

## 2018-01-08 VITALS — BP 122/44 | HR 60 | Temp 97.7°F | Ht 67.0 in | Wt 210.8 lb

## 2018-01-08 VITALS — BP 132/48 | HR 60 | Temp 97.7°F | Ht 67.0 in | Wt 210.0 lb

## 2018-01-08 DIAGNOSIS — E1151 Type 2 diabetes mellitus with diabetic peripheral angiopathy without gangrene: Secondary | ICD-10-CM

## 2018-01-08 DIAGNOSIS — R609 Edema, unspecified: Secondary | ICD-10-CM | POA: Diagnosis not present

## 2018-01-08 DIAGNOSIS — Z0000 Encounter for general adult medical examination without abnormal findings: Secondary | ICD-10-CM

## 2018-01-08 DIAGNOSIS — I10 Essential (primary) hypertension: Secondary | ICD-10-CM

## 2018-01-08 DIAGNOSIS — E782 Mixed hyperlipidemia: Secondary | ICD-10-CM

## 2018-01-08 DIAGNOSIS — R634 Abnormal weight loss: Secondary | ICD-10-CM | POA: Diagnosis not present

## 2018-01-08 DIAGNOSIS — I25708 Atherosclerosis of coronary artery bypass graft(s), unspecified, with other forms of angina pectoris: Secondary | ICD-10-CM | POA: Diagnosis not present

## 2018-01-08 NOTE — Patient Instructions (Signed)
Joseph [name] , Thank you for taking time to come for your Medicare Wellness Visit. I appreciate your ongoing commitment to your health goals. Please review the following plan we discussed and let me know if I can assist you in the future.   Screening recommendations/referrals: Colonoscopy: N/A Recommended yearly ophthalmology/optometry visit for glaucoma screening and checkup Recommended yearly dental visit for hygiene and checkup  Vaccinations: Influenza vaccine: Up to date Pneumococcal vaccine: Up to date Tdap vaccine: Up to date Shingles vaccine: Pt declines today.     Advanced directives: Advance directive discussed with you today. Even though you declined this today please call our office should you change your mind and we can give you the proper paperwork for you to fill out.  Conditions/risks identified: Obesity- recommend increasing exercise to 3 days a week for at least 30 minutes.   Next appointment: 2:00 PM today with Dr Rosanna Randy.  Preventive Care 82 Years and Older, Male Preventive care refers to lifestyle choices and visits with your health care provider that can promote health and wellness. What does preventive care include?  A yearly physical exam. This is also called an annual well check.  Dental exams once or twice a year.  Routine eye exams. Ask your health care provider how often you should have your eyes checked.  Personal lifestyle choices, including:  Daily care of your teeth and gums.  Regular physical activity.  Eating a healthy diet.  Avoiding tobacco and drug use.  Limiting alcohol use.  Practicing safe sex.  Taking low doses of aspirin every day.  Taking vitamin and mineral supplements as recommended by your health care provider. What happens during an annual well check? The services and screenings done by your health care provider during your annual well check will depend on your age, overall health, lifestyle risk factors, and family history  of disease. Counseling  Your health care provider may ask you questions about your:  Alcohol use.  Tobacco use.  Drug use.  Emotional well-being.  Home and relationship well-being.  Sexual activity.  Eating habits.  History of falls.  Memory and ability to understand (cognition).  Work and work Statistician. Screening  You may have the following tests or measurements:  Height, weight, and BMI.  Blood pressure.  Lipid and cholesterol levels. These may be checked every 5 years, or more frequently if you are over 66 years old.  Skin check.  Lung cancer screening. You may have this screening every year starting at age 82 if you have a 30-pack-year history of smoking and currently smoke or have quit within the past 15 years.  Fecal occult blood test (FOBT) of the stool. You may have this test every year starting at age 10.  Flexible sigmoidoscopy or colonoscopy. You may have a sigmoidoscopy every 5 years or a colonoscopy every 10 years starting at age 41.  Prostate cancer screening. Recommendations will vary depending on your family history and other risks.  Hepatitis C blood test.  Hepatitis B blood test.  Sexually transmitted disease (STD) testing.  Diabetes screening. This is done by checking your blood sugar (glucose) after you have not eaten for a while (fasting). You may have this done every 1-3 years.  Abdominal aortic aneurysm (AAA) screening. You may need this if you are a current or former smoker.  Osteoporosis. You may be screened starting at age 49 if you are at high risk. Talk with your health care provider about your test results, treatment options, and if necessary,  the need for more tests. Vaccines  Your health care provider may recommend certain vaccines, such as:  Influenza vaccine. This is recommended every year.  Tetanus, diphtheria, and acellular pertussis (Tdap, Td) vaccine. You may need a Td booster every 10 years.  Zoster vaccine. You may  need this after age 90.  Pneumococcal 13-valent conjugate (PCV13) vaccine. One dose is recommended after age 66.  Pneumococcal polysaccharide (PPSV23) vaccine. One dose is recommended after age 12. Talk to your health care provider about which screenings and vaccines you need and how often you need them. This information is not intended to replace advice given to you by your health care provider. Make sure you discuss any questions you have with your health care provider. Document Released: 12/04/2015 Document Revised: 07/27/2016 Document Reviewed: 09/08/2015 Elsevier Interactive Patient Education  2017 [place] Prevention in the Home Falls can cause injuries. They can happen to people of all ages. There are many things you can do to make your home safe and to help prevent falls. What can I do on the outside of my home?  Regularly fix the edges of walkways and driveways and fix any cracks.  Remove anything that might make you trip as you walk through a door, such as a raised step or threshold.  Trim any bushes or trees on the path to your home.  Use bright outdoor lighting.  Clear any walking paths of anything that might make someone trip, such as rocks or tools.  Regularly check to see if handrails are loose or broken. Make sure that both sides of any steps have handrails.  Any raised decks and porches should have guardrails on the edges.  Have any leaves, snow, or ice cleared regularly.  Use sand or salt on walking paths during winter.  Clean up any spills in your garage right away. This includes oil or grease spills. What can I do in the bathroom?  Use night lights.  Install grab bars by the toilet and in the tub and shower. Do not use towel bars as grab bars.  Use non-skid mats or decals in the tub or shower.  If you need to sit down in the shower, use a plastic, non-slip stool.  Keep the floor dry. Clean up any water that spills on the floor as soon as it  happens.  Remove soap buildup in the tub or shower regularly.  Attach bath mats securely with double-sided non-slip rug tape.  Do not have throw rugs and other things on the floor that can make you trip. What can I do in the bedroom?  Use night lights.  Make sure that you have a light by your bed that is easy to reach.  Do not use any sheets or blankets that are too big for your bed. They should not hang down onto the floor.  Have a firm chair that has side arms. You can use this for support while you get dressed.  Do not have throw rugs and other things on the floor that can make you trip. What can I do in the kitchen?  Clean up any spills right away.  Avoid walking on wet floors.  Keep items that you use a lot in easy-to-reach places.  If you need to reach something above you, use a strong step stool that has a grab bar.  Keep electrical cords out of the way.  Do not use floor polish or wax that makes floors slippery. If you must use  wax, use non-skid floor wax.  Do not have throw rugs and other things on the floor that can make you trip. What can I do with my stairs?  Do not leave any items on the stairs.  Make sure that there are handrails on both sides of the stairs and use them. Fix handrails that are broken or loose. Make sure that handrails are as long as the stairways.  Check any carpeting to make sure that it is firmly attached to the stairs. Fix any carpet that is loose or worn.  Avoid having throw rugs at the top or bottom of the stairs. If you do have throw rugs, attach them to the floor with carpet tape.  Make sure that you have a light switch at the top of the stairs and the bottom of the stairs. If you do not have them, ask someone to add them for you. What else can I do to help prevent falls?  Wear shoes that:  Do not have high heels.  Have rubber bottoms.  Are comfortable and fit you well.  Are closed at the toe. Do not wear sandals.  If you  use a stepladder:  Make sure that it is fully opened. Do not climb a closed stepladder.  Make sure that both sides of the stepladder are locked into place.  Ask someone to hold it for you, if possible.  Clearly mark and make sure that you can see:  Any grab bars or handrails.  First and last steps.  Where the edge of each step is.  Use tools that help you move around (mobility aids) if they are needed. These include:  Canes.  Walkers.  Scooters.  Crutches.  Turn on the lights when you go into a dark area. Replace any light bulbs as soon as they burn out.  Set up your furniture so you have a clear path. Avoid moving your furniture around.  If any of your floors are uneven, fix them.  If there are any pets around you, be aware of where they are.  Review your medicines with your doctor. Some medicines can make you feel dizzy. This can increase your chance of falling. Ask your doctor what other things that you can do to help prevent falls. This information is not intended to replace advice given to you by your health care provider. Make sure you discuss any questions you have with your health care provider. Document Released: 09/03/2009 Document Revised: 04/14/2016 Document Reviewed: 12/12/2014 Elsevier Interactive Patient Education  2017 [employer].

## 2018-01-08 NOTE — Progress Notes (Signed)
Subjective:   Joseph [name] is a 84 y.o. male who presents for Medicare Annual/Subsequent preventive examination.  Review of Systems:  N/A  Cardiac Risk Factors include: advanced age (>61mn, >>77women);diabetes mellitus;dyslipidemia;hypertension;male gender;obesity (BMI >30kg/m2)     Objective:    Vitals: BP (!) 122/44 (BP Location: Right Arm)   Pulse 60   Temp 97.7 F (36.5 C) (Oral)   Ht 5' 7" (1.702 m)   Wt 210 lb 12.8 oz (95.6 kg)   BMI 33.02 kg/m   Body mass index is 33.02 kg/m.  Advanced Directives 01/08/2018 08/08/2017 02/03/2017 12/23/2016 03/25/2016 03/21/2016 03/21/2016  Does Patient Have a Medical Advance Directive? No Yes No Yes No Yes Yes  Type of Advance Directive - Living will - Living will - Healthcare Power of [place]  Does patient want to make changes to medical advance directive? No - Patient declined - - - - No - Patient declined -  [place] of [place]? - - - No - copy requested - No - copy requested No - copy requested  Would patient like information on creating a medical advance directive? - - No - Patient declined - No - patient declined information - -    Tobacco Social History   Tobacco Use  Smoking Status Former Smoker  . Packs/day: 0.00  . Years: 6.00  . Pack years: 0.00  . Types: Pipe, Cigars  . Last attempt to quit: 12/23/1980  . Years since quitting: 37.0  Smokeless Tobacco Never Used     Counseling given: Not Answered   Clinical Intake:  Pre-visit preparation completed: Yes  Pain : No/denies pain Pain Score: 0-No pain     Nutritional Status: BMI > 30  Obese Nutritional Risks: None Diabetes: Yes(type 2) CBG done?: No Did pt. bring in CBG monitor from home?: No  How often do you need to have someone help you when you read instructions, pamphlets, or other written materials from your doctor or pharmacy?: 1 - Never  Interpreter Needed?: No  [facility]  [facility]  Past Medical History:  Diagnosis Date  . CAD (coronary artery disease)    a. s/p CABG;  b. 07/2012 Cath: 3VD including 80 dLCX (med Rx), 4/4 patent grafts.  . Chronic diastolic CHF (congestive heart failure) [place]    a. 07/2012 Echo: EF 55-60%, no rwma, Gr 1 DD, mild MR;  04/2014 Echo: EF 55-60%, no rwma, mild MR, mildly dil LA.  . Diabetes mellitus, type 2 ([place]   . HTN (hypertension)   . Hyperlipidemia   . Hypothyroidism   . Lower extremity edema    a. 03/2014 amlodipine d/c'd.  .Marland KitchenSpinal stenosis    Past Surgical History:  Procedure Laterality Date  . APPENDECTOMY    . CARDIAC CATHETERIZATION  07/2012   [facility]/no stents  . CHOLECYSTECTOMY    . CORNEAL TRANSPLANT    . CORONARY ARTERY BYPASS GRAFT  1994  . HEMORRHOID SURGERY    . PROSTATE BIOPSY    . TOE SURGERY     ingrown toe nail   Family History  Problem Relation Age of Onset  . Heart failure Mother   . Heart attack Father   . Stroke Brother   . Heart failure Maternal Uncle   . Heart failure Maternal Grandfather   . Sleep apnea Daughter   . Sleep apnea Son   . Kidney Stones Son    Social History   Socioeconomic  History  . Marital status: Married    Spouse name: None  . Number of children: 3  . Years of education: None  . Highest education level: Some college, no degree  Social Needs  . Financial resource strain: Not hard at all  . Food insecurity - worry: Never true  . Food insecurity - inability: Never true  . Transportation needs - medical: No  . Transportation needs - non-medical: No  Occupational History  . Occupation: retired  Tobacco Use  . Smoking status: Former Smoker    Packs/day: 0.00    Years: 6.00    Pack years: 0.00    Types: Pipe, Cigars    Last attempt to quit: 12/23/1980    Years since quitting: 37.0  . Smokeless tobacco: Never Used  Substance and Sexual Activity  . Alcohol use: No  . Drug use: No  . Sexual activity: No  Other Topics Concern  . None  Social History  Narrative   Retired.     Outpatient Encounter Medications as of 01/08/2018  Medication Sig  . amLODipine (NORVASC) 10 MG tablet Take 1 tablet (10 mg total) by mouth daily.  Marland Kitchen aspirin EC 81 MG tablet Take 1 tablet (81 mg total) by mouth daily.  Marland Kitchen atorvastatin (LIPITOR) 40 MG tablet TAKE ONE TABLET BY MOUTH ONCE DAILY  . blood glucose meter kit and supplies KIT Dispense Accuchek Aviva glucometer with 120 test strips.  Test glucose level 3 times per day with meals and at bedtime.  Dispense based on patient and insurance preference. Use up to four times daily as directed. (FOR ICD-9 250.00, 250.01, ICD 10 E11.9).  . calcipotriene (DOVONOX) 0.005 % cream Apply topically 2 (two) times daily.  . Cholecalciferol (VITAMIN D3) 2000 units TABS Take 1 capsule by mouth daily.  . clobetasol cream (TEMOVATE) 6.64 % Apply 1 application topically 2 (two) times daily.  . Continuous Blood Gluc Sensor ([place]) MISC 1 each by Does not apply route as needed.  Marland Kitchen glucose monitoring kit (FREESTYLE) monitoring kit 1 each by Does not apply route as needed for other.  . hydrocortisone 2.5 % cream Apply topically 2 (two) times daily.  . insulin aspart (NOVOLOG) 100 UNIT/ML injection Inject 0-9 Units into the skin 3 (three) times daily with meals. (Patient taking differently: Inject 0-9 Units into the skin as needed. 26 am, 16 lunch, 28 pm)  . insulin glargine (LANTUS) 100 UNIT/ML injection Inject 0.68 mLs (68 Units total) into the skin at bedtime. (Patient taking differently: Inject 40 Units into the skin at bedtime. 16 am and 65 at bedtime)  . isosorbide mononitrate (IMDUR) 30 MG 24 hr tablet Take 30 mg by mouth daily.  Marland Kitchen ketoconazole (NIZORAL) 2 % cream Apply 1 application topically 2 (two) times daily as needed for irritation.  Marland Kitchen levothyroxine (SYNTHROID, LEVOTHROID) 50 MCG tablet TAKE ONE TABLET BY MOUTH ONCE DAILY BEFORE BREAKFAST (Patient taking differently: TAKE ONE half TABLET BY MOUTH ONCE  DAILY BEFORE BREAKFAST)  . losartan (COZAAR) 100 MG tablet Take 1 tablet (100 mg total) by mouth daily.  . Magnesium 250 MG TABS Take by mouth daily as needed.  . mometasone (ELOCON) 0.1 % ointment Apply topically daily.  . nitroGLYCERIN (NITROSTAT) 0.4 MG SL tablet Place 1 tablet (0.4 mg total) under the tongue every 5 (five) minutes as needed for chest pain.  Marland Kitchen omeprazole (PRILOSEC) 20 MG capsule TAKE 1 CAPSULE BY MOUTH ONCE DAILY  . potassium chloride SA (KLOR-CON M15) 15 MEQ  tablet Take 1 tablet (15 mEq total) by mouth 2 (two) times daily. (Patient taking differently: Take 10 mEq by mouth 2 (two) times daily. )  . torsemide (DEMADEX) 20 MG tablet Take 1 tablet (20 mg total) by mouth 2 (two) times daily. (Patient taking differently: Take 20 mg by mouth daily. )  . vitamin C (ASCORBIC ACID) 500 MG tablet Take 500 mg by mouth daily.  . [DISCONTINUED] doxycycline (VIBRA-TABS) 100 MG tablet Take 1 tablet (100 mg total) by mouth 2 (two) times daily.   No facility-administered encounter medications on file as of 01/08/2018.     Activities of Daily Living In your present state of health, do you have any difficulty performing the following activities: 01/08/2018  Hearing? N  Vision? N  Difficulty concentrating or making decisions? N  Walking or climbing stairs? N  Dressing or bathing? N  Doing errands, shopping? N  Preparing Food and eating ? N  Using the Toilet? N  In the past six months, have you accidently leaked urine? N  Do you have problems with loss of bowel control? N  Managing your Medications? N  Managing your Finances? N  Housekeeping or managing your Housekeeping? N  Some recent data might be hidden    Patient Care Team: [name]., MD as PCP - General (Unknown Physician Specialty) Minna Merritts, MD as Consulting Physician (Cardiology) Samara Deist, DPM as Referring Physician (Podiatry) Gabriel Carina, Betsey Holiday, MD as Physician Assistant (Endocrinology) Marsh Dolly,  MD as Referring Physician (Internal Medicine)   Assessment:   This is a routine wellness examination for Koren.  Exercise Activities and Dietary recommendations Current Exercise Habits: The patient does not participate in regular exercise at present, Exercise limited by: Other - see comments(Wife has been sick.)  Goals    . Exercise 3x per week (30 min per time)     Recommend increasing exercise to 3 days a week for at least 30 minutes.        Fall Risk Fall Risk  01/08/2018 08/08/2017 12/23/2016 09/28/2016 07/28/2015  Falls in the past year? _0    Is the patient's home free of loose throw rugs in walkways, pet beds, electrical cords, etc?   yes      Grab bars in the bathroom? yes      Handrails on the stairs? N/a     Adequate lighting?   yes  Timed Get Up and Go Performed: N/A  Depression Screen PHQ 2/9 Scores 01/08/2018 08/08/2017 12/23/2016 09/28/2016  PHQ - 2 Score 0 0 0 0  PHQ- 9 Score - 0 - -    Cognitive Function     6CIT Screen 01/08/2018 12/23/2016  What Year? 0 points 0 points  What month? 0 points 0 points  What time? 0 points 0 points  Count back from 20 0 points 0 points  Months in reverse 2 points 0 points  Repeat phrase 0 points 2 points  Total Score 2 2    Immunization History  Administered Date(s) Administered  . Influenza Split 08/23/2011  . Influenza, High Dose Seasonal PF 09/01/2015, 08/08/2017  . Influenza,inj,Quad PF,6+ Mos 08/05/2013, 08/04/2014, 08/18/2016  . Pneumococcal Conjugate-13 03/24/2015  . Pneumococcal Polysaccharide-23 10/18/2014  . Tdap 08/04/2014  . Zoster 08/04/2014    Qualifies for Shingles Vaccine? Pt declines today.   Screening Tests Health Maintenance  Topic Date Due  . OPHTHALMOLOGY EXAM  09/21/2017  . HEMOGLOBIN A1C  02/05/2018  . FOOT EXAM  12/19/2018  .  TETANUS/TDAP  08/04/2024  . INFLUENZA VACCINE  Completed  . PNA vac Low Risk Adult  Completed   Cancer Screenings: Lung: Low Dose CT Chest recommended if Age  109-80 years, 30 pack-year currently smoking OR have quit w/in 15years. Patient does not qualify. Colorectal: N/A  Additional Screenings:  Hepatitis C Screening: N/A    Plan:  I have personally reviewed and addressed the Medicare Annual Wellness questionnaire and have noted the following in the patient's chart:  A. Medical and social history B. Use of alcohol, tobacco or illicit drugs  C. Current medications and supplements D. Functional ability and status E.  Nutritional status F.  Physical activity G. Advance directives H. List of other physicians I.  Hospitalizations, surgeries, and ER visits in previous 12 months J.  [place] such as hearing and vision if needed, cognitive and depression L. Referrals and appointments - none  In addition, I have reviewed and discussed with patient certain preventive protocols, quality metrics, and best practice recommendations. A written personalized care plan for preventive services as well as general preventive health recommendations were provided to patient.  See attached scanned questionnaire for additional information.   Signed,  Fabio Neighbors, LPN Nurse Health Advisor   Nurse Recommendations: Record release completed and will be faxed to the [place] in order to obtain last eye exam notes. Will update HM once received.

## 2018-01-08 NOTE — Progress Notes (Signed)
Patient: Joseph [name], Male    DOB: Jun 09, 1925, 84 y.o.   MRN: [id number] Visit Date: 01/08/2018  Today's Provider: Wilhemena Durie, MD   Chief Complaint  Patient presents with  . Hypertension  . Diabetes    followed by Endocrinologist   Subjective:   Pt is here today for a follow up of chronic problems. He reports that he is feeling well. His diabetes is followed by endocrinology.    Review of Systems  Constitutional: Negative.   HENT: Positive for dental problem and nosebleeds.   Eyes: Negative.   Respiratory: Negative.   Cardiovascular: Positive for leg swelling.  Gastrointestinal: Negative.   Endocrine: Negative.   Genitourinary: Negative.   Musculoskeletal: Negative.   Skin: Positive for rash.  Allergic/Immunologic: Negative.   Neurological: Negative.   Hematological: Bruises/bleeds easily.  Psychiatric/Behavioral: Negative.     Social History      He  reports that he quit smoking about 37 years ago. His smoking use included pipe and cigars. He smoked 0.00 packs per day for 6.00 years. he has never used smokeless tobacco. He reports that he does not drink alcohol or use drugs.       Social History   Socioeconomic History  . Marital status: Married    Spouse name: None  . Number of children: 3  . Years of education: None  . Highest education level: Some college, no degree  Social Needs  . Financial resource strain: Not hard at all  . Food insecurity - worry: Never true  . Food insecurity - inability: Never true  . Transportation needs - medical: No  . Transportation needs - non-medical: No  Occupational History  . Occupation: retired  Tobacco Use  . Smoking status: Former Smoker    Packs/day: 0.00    Years: 6.00    Pack years: 0.00    Types: Pipe, Cigars    Last attempt to quit: 12/23/1980    Years since quitting: 37.0  . Smokeless tobacco: Never Used  Substance and Sexual Activity  . Alcohol use: No  . Drug use: No  . Sexual  activity: No  Other Topics Concern  . None  Social History Narrative   Retired.     Past Medical History:  Diagnosis Date  . CAD (coronary artery disease)    a. s/p CABG;  b. 07/2012 Cath: 3VD including 80 dLCX (med Rx), 4/4 patent grafts.  . Chronic diastolic CHF (congestive heart failure) ([place])    a. 07/2012 Echo: EF 55-60%, no rwma, Gr 1 DD, mild MR;  04/2014 Echo: EF 55-60%, no rwma, mild MR, mildly dil LA.  . Diabetes mellitus, type 2 ([place])   . HTN (hypertension)   . Hyperlipidemia   . Hypothyroidism   . Lower extremity edema    a. 03/2014 amlodipine d/c'd.  Marland Kitchen Spinal stenosis      Patient Active Problem List   Diagnosis Date Noted  . Acute cholecystitis   . Abdominal pain   . Calculus of gallbladder with acute cholecystitis without obstruction   . Diverticulitis of large intestine without perforation or abscess without bleeding   . Pain of upper abdomen   . SOB (shortness of breath)   . S/P CABG (coronary artery bypass graft)   . Morbid obesity due to excess calories ([place])   . Acute respiratory failure with hypoxia ([place]) 03/21/2016  . Back pain, chronic 07/28/2015  . Benign fibroma of prostate 07/28/2015  . Acid reflux 07/28/2015  .  Adult hypothyroidism 07/28/2015  . Eunuchoidism 07/28/2015  . Neuropathy 07/28/2015  . Arthritis, degenerative 07/28/2015  . Psoriasis 07/28/2015  . Obesity 09/09/2014  . Bilateral leg weakness 09/09/2014  . Diastolic CHF, acute on chronic ([place]) 05/13/2014  . Bilateral leg edema 04/15/2014  . Bradycardia 08/13/2013  . Edema 06/21/2012  . Fatigue 09/15/2011  . Diabetes mellitus with peripheral vascular disease ([place]) 09/15/2011  . DYSPNEA 06/09/2009  . Hyperlipidemia 06/07/2009  . HYPERTENSION, BENIGN 06/07/2009  . CAD, ARTERY BYPASS GRAFT 06/07/2009    Past Surgical History:  Procedure Laterality Date  . APPENDECTOMY    . CARDIAC CATHETERIZATION  07/2012   [facility]/no stents  . CHOLECYSTECTOMY    . CORNEAL TRANSPLANT    . CORONARY  ARTERY BYPASS GRAFT  1994  . HEMORRHOID SURGERY    . PROSTATE BIOPSY    . TOE SURGERY     ingrown toe nail    Family History        Family Status  Relation Name Status  . Mother  Deceased       MVA  . Father  Deceased at age 51  . Sister  Deceased       MVA  . Brother  Deceased  . Mat Uncle  (Not Specified)  . MGF  (Not Specified)  . Daughter  Alive  . Son  Alive  . Son  Alive        His family history includes Heart attack in his father; Heart failure in his maternal grandfather, maternal uncle, and mother; Kidney Stones in his son; Sleep apnea in his daughter and son; Stroke in his brother.      No Known Allergies   Current Outpatient Medications:  .  amLODipine (NORVASC) 10 MG tablet, Take 1 tablet (10 mg total) by mouth daily., Disp: 90 tablet, Rfl: 3 .  aspirin EC 81 MG tablet, Take 1 tablet (81 mg total) by mouth daily., Disp: 90 tablet, Rfl: 3 .  atorvastatin (LIPITOR) 40 MG tablet, TAKE ONE TABLET BY MOUTH ONCE DAILY, Disp: 90 tablet, Rfl: 3 .  blood glucose meter kit and supplies KIT, Dispense Accuchek Aviva glucometer with 120 test strips.  Test glucose level 3 times per day with meals and at bedtime.  Dispense based on patient and insurance preference. Use up to four times daily as directed. (FOR ICD-9 250.00, 250.01, ICD 10 E11.9)., Disp: 1 each, Rfl: 0 .  calcipotriene (DOVONOX) 0.005 % cream, Apply topically 2 (two) times daily., Disp: , Rfl:  .  Cholecalciferol (VITAMIN D3) 2000 units TABS, Take 1 capsule by mouth daily., Disp: , Rfl:  .  clobetasol cream (TEMOVATE) 3.66 %, Apply 1 application topically 2 (two) times daily., Disp: , Rfl:  .  Continuous Blood Gluc Sensor ([place]) MISC, 1 each by Does not apply route as needed., Disp: 90 each, Rfl: 1 .  glucose monitoring kit (FREESTYLE) monitoring kit, 1 each by Does not apply route as needed for other., Disp: 1 each, Rfl: 0 .  hydrocortisone 2.5 % cream, Apply topically 2 (two) times  daily., Disp: , Rfl:  .  insulin aspart (NOVOLOG) 100 UNIT/ML injection, Inject 0-9 Units into the skin 3 (three) times daily with meals. (Patient taking differently: Inject 0-9 Units into the skin as needed. 26 am, 16 lunch, 28 pm), Disp: 10 mL, Rfl: 0 .  insulin glargine (LANTUS) 100 UNIT/ML injection, Inject 0.68 mLs (68 Units total) into the skin at bedtime. (Patient taking differently: Inject 40 Units  into the skin at bedtime. 16 am and 65 at bedtime), Disp: 10 mL, Rfl: 0 .  isosorbide mononitrate (IMDUR) 30 MG 24 hr tablet, Take 30 mg by mouth daily., Disp: , Rfl:  .  ketoconazole (NIZORAL) 2 % cream, Apply 1 application topically 2 (two) times daily as needed for irritation., Disp: , Rfl:  .  levothyroxine (SYNTHROID, LEVOTHROID) 50 MCG tablet, TAKE ONE TABLET BY MOUTH ONCE DAILY BEFORE BREAKFAST (Patient taking differently: TAKE ONE half TABLET BY MOUTH ONCE DAILY BEFORE BREAKFAST), Disp: 90 tablet, Rfl: 1 .  losartan (COZAAR) 100 MG tablet, Take 1 tablet (100 mg total) by mouth daily., Disp: 90 tablet, Rfl: 3 .  Magnesium 250 MG TABS, Take by mouth daily as needed., Disp: , Rfl:  .  mometasone (ELOCON) 0.1 % ointment, Apply topically daily., Disp: , Rfl:  .  nitroGLYCERIN (NITROSTAT) 0.4 MG SL tablet, Place 1 tablet (0.4 mg total) under the tongue every 5 (five) minutes as needed for chest pain., Disp: 25 tablet, Rfl: 3 .  omeprazole (PRILOSEC) 20 MG capsule, TAKE 1 CAPSULE BY MOUTH ONCE DAILY, Disp: 90 capsule, Rfl: 3 .  potassium chloride SA (KLOR-CON M15) 15 MEQ tablet, Take 1 tablet (15 mEq total) by mouth 2 (two) times daily. (Patient taking differently: Take 10 mEq by mouth 2 (two) times daily. ), Disp: 180 tablet, Rfl: 3 .  torsemide (DEMADEX) 20 MG tablet, Take 1 tablet (20 mg total) by mouth 2 (two) times daily. (Patient taking differently: Take 20 mg by mouth daily. ), Disp: 180 tablet, Rfl: 3 .  vitamin C (ASCORBIC ACID) 500 MG tablet, Take 500 mg by mouth daily., Disp: , Rfl:     Patient Care Team: [name]., MD as PCP - General (Unknown Physician Specialty) Minna Merritts, MD as Consulting Physician (Cardiology) Samara Deist, DPM as Referring Physician (Podiatry) [place]um, Betsey Holiday, MD as Physician Assistant (Endocrinology) Marsh Dolly, MD as Referring Physician (Internal Medicine)      Objective:   Vitals: BP (!) 132/48   Pulse 60   Temp 97.7 F (36.5 C)   Ht 5' 7"  (1.702 m)   Wt 210 lb (95.3 kg)   BMI 32.89 kg/m    Vitals:   01/08/18 1348  BP: (!) 132/48  Pulse: 60  Temp: 97.7 F (36.5 C)  Weight: 210 lb (95.3 kg)  Height: 5' 7"  (1.702 m)     Physical Exam  Constitutional: He is oriented to person, place, and time. He appears well-developed and well-nourished.  Obese WM appears younger than his age.  HENT:  Head: Normocephalic and atraumatic.  Right Ear: External ear normal.  Left Ear: External ear normal.  Nose: Nose normal.  Eyes: Conjunctivae are normal.  Neck: No thyromegaly present.  Cardiovascular: Normal rate, regular rhythm and normal heart sounds.  Pulmonary/Chest: Effort normal and breath sounds normal.  Abdominal: Soft. Bowel sounds are normal.  Musculoskeletal: He exhibits edema.  1+ edema.  Neurological: He is alert and oriented to person, place, and time.  Skin: Skin is warm and dry.  Psychiatric: He has a normal mood and affect. His behavior is normal. Judgment and thought content normal.     Depression Screen PHQ 2/9 Scores 01/08/2018 08/08/2017 12/23/2016 09/28/2016  PHQ - 2 Score 0 0 0 0  PHQ- 9 Score - 0 - -      Assessment & Plan:  TIIDM CAD HTN HLD  Obtain labs for above Diagnosis  --------------------------------------------------------------------   I have done  the exam and reviewed the above chart and it is accurate to the best of my knowledge. Development worker, community has been used in this note in any air is in the dictation or transcription are unintentional.  Wilhemena Durie, MD   [place]

## 2018-01-11 DIAGNOSIS — I10 Essential (primary) hypertension: Secondary | ICD-10-CM | POA: Diagnosis not present

## 2018-01-11 DIAGNOSIS — E782 Mixed hyperlipidemia: Secondary | ICD-10-CM | POA: Diagnosis not present

## 2018-01-12 ENCOUNTER — Telehealth: Payer: Self-pay

## 2018-01-12 LAB — COMPREHENSIVE METABOLIC PANEL
ALT: 15 IU/L (ref 0–44)
AST: 15 IU/L (ref 0–40)
Albumin/Globulin Ratio: 1.3 (ref 1.2–2.2)
Albumin: 3.9 g/dL (ref 3.2–4.6)
Alkaline Phosphatase: 59 IU/L (ref 39–117)
BILIRUBIN TOTAL: 0.3 mg/dL (ref 0.0–1.2)
BUN/Creatinine Ratio: 16 (ref 10–24)
BUN: 18 mg/dL (ref 10–36)
CHLORIDE: 104 mmol/L (ref 96–106)
CO2: 23 mmol/L (ref 20–29)
Calcium: 9.3 mg/dL (ref 8.6–10.2)
Creatinine, Ser: 1.13 mg/dL (ref 0.76–1.27)
GFR calc non Af Amer: 57 mL/min/{1.73_m2} — ABNORMAL LOW (ref >59)
GFR, EST AFRICAN AMERICAN: 66 mL/min/{1.73_m2} (ref >59)
GLUCOSE: 210 mg/dL — AB (ref 65–99)
Globulin, Total: 3 g/dL (ref 1.5–4.5)
Potassium: 4.3 mmol/L (ref 3.5–5.2)
Sodium: 145 mmol/L — ABNORMAL HIGH (ref 134–144)
TOTAL PROTEIN: 6.9 g/dL (ref 6.0–8.5)

## 2018-01-12 LAB — CBC WITH DIFFERENTIAL/PLATELET
BASOS ABS: 0.1 10*3/uL (ref 0.0–0.2)
Basos: 1 %
EOS (ABSOLUTE): 0.8 10*3/uL — ABNORMAL HIGH (ref 0.0–0.4)
EOS: 10 %
HEMOGLOBIN: 13 g/dL (ref 13.0–17.7)
Hematocrit: 39.5 % (ref 37.5–51.0)
IMMATURE GRANULOCYTES: 0 %
Immature Grans (Abs): 0 10*3/uL (ref 0.0–0.1)
LYMPHS ABS: 3.3 10*3/uL — AB (ref 0.7–3.1)
Lymphs: 41 %
MCH: 29.9 pg (ref 26.6–33.0)
MCHC: 32.9 g/dL (ref 31.5–35.7)
MCV: 91 fL (ref 79–97)
MONOCYTES: 8 %
Monocytes Absolute: 0.6 10*3/uL (ref 0.1–0.9)
NEUTROS PCT: 40 %
Neutrophils Absolute: 3.2 10*3/uL (ref 1.4–7.0)
Platelets: 193 10*3/uL (ref 150–379)
RBC: 4.35 x10E6/uL (ref 4.14–5.80)
RDW: 14.2 % (ref 12.3–15.4)
WBC: 8 10*3/uL (ref 3.4–10.8)

## 2018-01-12 LAB — LIPID PANEL
CHOLESTEROL TOTAL: 149 mg/dL (ref 100–199)
Chol/HDL Ratio: 4.1 ratio (ref 0.0–5.0)
HDL: 36 mg/dL — AB (ref >39)
LDL Calculated: 77 mg/dL (ref 0–99)
Triglycerides: 180 mg/dL — ABNORMAL HIGH (ref 0–149)
VLDL CHOLESTEROL CAL: 36 mg/dL (ref 5–40)

## 2018-01-12 LAB — TSH: TSH: 6.28 u[IU]/mL — ABNORMAL HIGH (ref 0.450–4.500)

## 2018-01-12 NOTE — Telephone Encounter (Signed)
Patient advised as below. Patient reports he takes only half a tablet. Patient only takes 25 mcg. I advised patient to go ahead and take one full tablet.  sd

## 2018-01-12 NOTE — Telephone Encounter (Signed)
-----   Message from [name]., MD sent at 01/12/2018  1:04 PM EST ----- Stable--increase synthroid from 50 to 75 mcg daily.

## 2018-02-01 ENCOUNTER — Other Ambulatory Visit: Payer: Self-pay | Admitting: Family Medicine

## 2018-02-01 DIAGNOSIS — E1151 Type 2 diabetes mellitus with diabetic peripheral angiopathy without gangrene: Secondary | ICD-10-CM

## 2018-02-06 ENCOUNTER — Ambulatory Visit: Payer: Self-pay | Admitting: Family Medicine

## 2018-02-15 ENCOUNTER — Other Ambulatory Visit: Payer: Self-pay | Admitting: Family Medicine

## 2018-02-15 DIAGNOSIS — I10 Essential (primary) hypertension: Secondary | ICD-10-CM

## 2018-03-13 DIAGNOSIS — E1165 Type 2 diabetes mellitus with hyperglycemia: Secondary | ICD-10-CM | POA: Diagnosis not present

## 2018-03-20 DIAGNOSIS — E1165 Type 2 diabetes mellitus with hyperglycemia: Secondary | ICD-10-CM | POA: Diagnosis not present

## 2018-03-20 DIAGNOSIS — E1159 Type 2 diabetes mellitus with other circulatory complications: Secondary | ICD-10-CM | POA: Diagnosis not present

## 2018-03-20 DIAGNOSIS — E1129 Type 2 diabetes mellitus with other diabetic kidney complication: Secondary | ICD-10-CM | POA: Diagnosis not present

## 2018-03-20 DIAGNOSIS — Z794 Long term (current) use of insulin: Secondary | ICD-10-CM | POA: Diagnosis not present

## 2018-03-20 DIAGNOSIS — R809 Proteinuria, unspecified: Secondary | ICD-10-CM | POA: Diagnosis not present

## 2018-03-24 NOTE — Progress Notes (Signed)
Cardiology Office Note  Date:  03/26/2018   ID:  Joseph [name], DOB [birth year], MRN [id number]  PCP:  [name]., MD   Chief Complaint  Patient presents with  . other    3 month follow up. Meds reviewed by the pt. verbally. "doing well."  Pt. c/o LE edema.     HPI:  Joseph [name] is a 84 year old gentleman with a hx of  CAD, CABG in 1994,  PAD ,followed by Dr. Lucky Cowboy,  obesity,  hyperlipidemia,  arthritis of the knees,   fatigue, SOB, leg weakness,  lower extremity edema,  Carotid u/s  Less than 39% stenosis bilaterally presenting for routine followup of his coronary artery disease and history of acute on chronic diastolic CHF.   Stopped his exercise, as wife was not able to participate with him Still with significant leg swelling, redness, itching Has been taking torsemide 20 mg daily  Reports blood pressure stable No chest pain or significant shortness of breath Weight has stabilized but still high  Lab work reviewed showing hemoglobin A1c 8 Hematocrit 38.8 Creatinine 1.1 glucose 149 potassium 4.2 TSH 2.1  No regular exercise, minimally active  Glu 200 Total chol 149, LDL 77 TSH 6.2  EKG personally reviewed by myself on todays visit  shows normal sinus rhythm rate 55 bpm intraventricular conduction delay, left anterior fascicular block  Other past medical history reviewed  headache 02/03/2017,  Seen in th ER for H/A 02/03/17 Confused,  In the emergency room had CT head, Showing atherosclerotic calcification of the cavernous carotid arteries bilaterally. Lab work within normal limits No other focal neurologic deficits Patient and family thinks he had a TIA  admission May 2017 for acute cholecystitis, developing acute respiratory distress with hypoxia and acute renal disease, peak creatinine of 2. Notes indicating HIDA scan positive for acute cholecystitis. s/p cholecystostomy tube placed in through interventional radiology 03/24/16 Diuretic was  discontinued in the setting of acute renal failure He does report greater than 20 pound weight loss  Echocardiogram 03/22/2016 reviewed with him, ejection fraction 50-55% Chest x-ray 03/29/2016 with small right pleural effusion  Ultrasound of the legs showing patent right SFA stent, left side CFA and popliteal artery with good flow, waveform deterioration around the ankle on the left  Last Cardiac catheterization was performed at [facility] 08/16/2012 Catheterization showed severe three-vessel coronary artery disease with patent grafts x4. There was 80% disease of a small to moderate sized distal left circumflex. Ejection fraction 45-50%. No significant aortic valve stenosis or mitral valve regurgitation. The findings were discussed with interventional cardiology and medical management was recommended for the distal left circumflex disease.  previous Echocardiogram did not show elevated right-sided pressures and ejection fraction was normal. Previous episode of tachycardia in June 2013 that resolved without intervention  PMH:   has a past medical history of CAD (coronary artery disease), Chronic diastolic CHF (congestive heart failure) ([place]), Diabetes mellitus, type 2 ([place]), HTN (hypertension), Hyperlipidemia, Hypothyroidism, Lower extremity edema, and Spinal stenosis.  PSH:    Past Surgical History:  Procedure Laterality Date  . APPENDECTOMY    . CARDIAC CATHETERIZATION  07/2012   [facility]/no stents  . CHOLECYSTECTOMY    . CORNEAL TRANSPLANT    . CORONARY ARTERY BYPASS GRAFT  1994  . HEMORRHOID SURGERY    . PROSTATE BIOPSY    . TOE SURGERY     ingrown toe nail    Current Outpatient Medications  Medication Sig Dispense Refill  . aspirin EC 81 MG tablet  Take 1 tablet (81 mg total) by mouth daily. 90 tablet 3  . atorvastatin (LIPITOR) 40 MG tablet TAKE ONE TABLET BY MOUTH ONCE DAILY 90 tablet 3  . blood glucose meter kit and supplies KIT Dispense Accuchek Aviva glucometer with 120 test  strips.  Test glucose level 3 times per day with meals and at bedtime.  Dispense based on patient and insurance preference. Use up to four times daily as directed. (FOR ICD-9 250.00, 250.01, ICD 10 E11.9). 1 each 0  . calcipotriene (DOVONOX) 0.005 % cream Apply topically 2 (two) times daily.    . Cholecalciferol (VITAMIN D3) 2000 units TABS Take 1 capsule by mouth daily.    . clobetasol cream (TEMOVATE) 7.82 % Apply 1 application topically 2 (two) times daily.    . Continuous Blood Gluc Sensor (FREESTYLE LIBRE 14 DAY SENSOR) MISC  USE ONE SENSOR EVERY 14 DAYS 2 each 11  . glucose monitoring kit (FREESTYLE) monitoring kit 1 each by Does not apply route as needed for other. 1 each 0  . hydrocortisone 2.5 % cream Apply topically 2 (two) times daily.    . insulin aspart (NOVOLOG) 100 UNIT/ML injection Inject 0-9 Units into the skin 3 (three) times daily with meals. (Patient taking differently: Inject 0-9 Units into the skin as needed. 26 am, 16 lunch, 28 pm) 10 mL 0  . insulin glargine (LANTUS) 100 UNIT/ML injection Inject 0.68 mLs (68 Units total) into the skin at bedtime. (Patient taking differently: Inject 40 Units into the skin at bedtime. 16 am and 65 at bedtime) 10 mL 0  . ketoconazole (NIZORAL) 2 % cream Apply 1 application topically 2 (two) times daily as needed for irritation.    Marland Kitchen levothyroxine (SYNTHROID, LEVOTHROID) 50 MCG tablet TAKE ONE TABLET BY MOUTH ONCE DAILY BEFORE BREAKFAST (Patient taking differently: TAKE ONE half TABLET BY MOUTH ONCE DAILY BEFORE BREAKFAST) 90 tablet 1  . losartan (COZAAR) 100 MG tablet TAKE 1 TABLET BY MOUTH ONCE DAILY 90 tablet 3  . Magnesium 250 MG TABS Take by mouth daily as needed.    . mometasone (ELOCON) 0.1 % ointment Apply topically daily.    . nitroGLYCERIN (NITROSTAT) 0.4 MG SL tablet Place 1 tablet (0.4 mg total) under the tongue every 5 (five) minutes as needed for chest pain. 25 tablet 3  . omeprazole (PRILOSEC) 20 MG capsule TAKE 1 CAPSULE BY MOUTH  ONCE DAILY 90 capsule 3  . potassium chloride (K-DUR) 10 MEQ tablet Take 10 mEq by mouth 2 (two) times daily.    Marland Kitchen torsemide (DEMADEX) 20 MG tablet Take 20 mg by mouth daily.    . vitamin C (ASCORBIC ACID) 500 MG tablet Take 500 mg by mouth daily.    . Amlodipine 10 daily Take 1 tablet (10 mg total) by mouth daily. 90 tablet 3   No current facility-administered medications for this visit.      Allergies:   Patient has no known allergies.   Social History:  The patient  reports that he quit smoking about 37 years ago. His smoking use included pipe and cigars. He smoked 0.00 packs per day for 6.00 years. He has never used smokeless tobacco. He reports that he does not drink alcohol or use drugs.   Family History:   family history includes Heart attack in his father; Heart failure in his maternal grandfather, maternal uncle, and mother; Kidney Stones in his son; Sleep apnea in his daughter and son; Stroke in his brother.    Review of  Systems: Review of Systems  Constitutional: Negative.        Weight gain  Respiratory: Negative.   Cardiovascular: Positive for leg swelling.  Gastrointestinal: Negative.   Musculoskeletal: Negative.   Neurological: Negative.   Psychiatric/Behavioral: Negative.   All other systems reviewed and are negative.    PHYSICAL EXAM: VS:  BP (!) 124/56 (BP Location: Left Arm, Patient Position: Sitting, Cuff Size: Normal)   Pulse (!) 55   Ht _0  (1.702 m)   Wt 207 lb 12 oz (94.2 kg)   BMI 32.54 kg/m  , BMI Body mass index is 32.54 kg/m. GEN: Well nourished, well developed, in no acute distress,  obese HEENT: normal  Neck: no JVD, carotid bruits, or masses Cardiac: RRR; no murmurs, rubs, or gallops,no edema  Respiratory:  clear to auscultation bilaterally, normal work of breathing GI: soft, nontender, nondistended, + BS MS: no deformity or atrophy  Skin: warm and dry, no rash Neuro:  Strength and sensation are intact Psych: euthymic mood, full  affect    Recent Labs: 01/11/2018: ALT 15; BUN 18; Creatinine, Ser 1.13; Hemoglobin 13.0; Platelets 193; Potassium 4.3; Sodium 145; TSH 6.280    Lipid Panel Lab Results  Component Value Date   CHOL 149 01/11/2018   HDL 36 (L) 01/11/2018   LDLCALC 77 01/11/2018   TRIG 180 (H) 01/11/2018      Wt Readings from Last 3 Encounters:  03/26/18 207 lb 12 oz (94.2 kg)  01/08/18 210 lb (95.3 kg)  01/08/18 210 lb 12.8 oz (95.6 kg)       ASSESSMENT AND PLAN:  Mixed hyperlipidemia -  Cholesterol is at goal on the current lipid regimen. No changes to the medications were made. Stable Recommended weight loss  HYPERTENSION, BENIGN -  Continues to have significant leg swelling We will stop amlodipine and start isosorbide 30 mill grams daily which he was on previously in 2018 Compression hose if he can tolerate them  Atherosclerosis of coronary artery bypass graft of native heart with stable angina pectoris ([place]) -  Currently with no symptoms of angina. No further workup at this time. Continue current medication regimen.  Stable  Diabetes mellitus with peripheral vascular disease ([place]) -  Dietary guide provided Recommended low carbohydrate food  Diastolic CHF, acute on chronic (HCC) -  Hold the amlodipine and continue torsemide 20 daily Could take extra torsemide as needed for worsening swelling or shortness of breath  SOB (shortness of breath) -  Denies excessive shortness of breath but he is sedentary Weight continues to run high Recommended he continue his walking program  Morbid obesity due to excess calories ([place]) -  Again recommended low carbohydrate diet,  Guide provided  unable to exercise  Bilateral leg edema -  Hold amlodipine Lasix 20 daily Extra as needed  PAD (peripheral artery disease) ([place]) -  Stressed importance of aggressive diabetes control, low cholesterol   Total encounter time more than 25 minutes  Greater than 50% was spent in counseling and  coordination of care with the patient   Disposition:   F/U 6 month   Orders Placed This Encounter  Procedures  . EKG 12-Lead     Signed, Esmond Plants, M.D., Ph.D. 03/26/2018  [facility] [place], [place] [phone]

## 2018-03-26 ENCOUNTER — Encounter: Payer: Self-pay | Admitting: Cardiovascular Disease

## 2018-03-26 ENCOUNTER — Ambulatory Visit: Payer: PPO | Admitting: Cardiovascular Disease

## 2018-03-26 VITALS — BP 124/56 | HR 55 | Ht 67.0 in | Wt 207.8 lb

## 2018-03-26 DIAGNOSIS — R0602 Shortness of breath: Secondary | ICD-10-CM

## 2018-03-26 DIAGNOSIS — R600 Localized edema: Secondary | ICD-10-CM | POA: Diagnosis not present

## 2018-03-26 DIAGNOSIS — I5033 Acute on chronic diastolic (congestive) heart failure: Secondary | ICD-10-CM | POA: Diagnosis not present

## 2018-03-26 DIAGNOSIS — I10 Essential (primary) hypertension: Secondary | ICD-10-CM

## 2018-03-26 DIAGNOSIS — I25708 Atherosclerosis of coronary artery bypass graft(s), unspecified, with other forms of angina pectoris: Secondary | ICD-10-CM | POA: Diagnosis not present

## 2018-03-26 DIAGNOSIS — E1151 Type 2 diabetes mellitus with diabetic peripheral angiopathy without gangrene: Secondary | ICD-10-CM | POA: Diagnosis not present

## 2018-03-26 DIAGNOSIS — Z951 Presence of aortocoronary bypass graft: Secondary | ICD-10-CM

## 2018-03-26 DIAGNOSIS — E782 Mixed hyperlipidemia: Secondary | ICD-10-CM | POA: Diagnosis not present

## 2018-03-26 MED ORDER — ISOSORBIDE MONONITRATE ER 30 MG PO TB24: 30.0000 mg | ORAL_TABLET | Freq: Every day | ORAL | 3 refills | Status: DC

## 2018-03-26 NOTE — Patient Instructions (Addendum)
Medication Instructions:   Please hold the amlodipine, this can cause leg swelling  Please restart isosorbide one a day   Labwork:  No new labs needed  Testing/Procedures:  No further testing at this time   Follow-Up: It was a pleasure seeing you in the office today. Please call us if you have new issues that need to be addressed before your next appt.  [phone]  Your physician wants you to follow-up in: 6 months.  You will receive a reminder letter in the mail two months in advance. If you don't receive a letter, please call our office to schedule the follow-up appointment.  If you need a refill on your cardiac medications before your next appointment, please call your pharmacy.  For educational health videos Log in to : [email or web address] Or : [email or web address], password : [email or web address]

## 2018-04-02 ENCOUNTER — Other Ambulatory Visit: Payer: Self-pay | Admitting: Family Medicine

## 2018-04-16 ENCOUNTER — Other Ambulatory Visit: Payer: Self-pay | Admitting: Cardiovascular Disease

## 2018-04-23 ENCOUNTER — Telehealth: Payer: Self-pay | Admitting: Family Medicine

## 2018-04-23 ENCOUNTER — Ambulatory Visit (INDEPENDENT_AMBULATORY_CARE_PROVIDER_SITE_OTHER): Payer: PPO | Admitting: Family Medicine

## 2018-04-23 ENCOUNTER — Encounter: Payer: Self-pay | Admitting: Family Medicine

## 2018-04-23 VITALS — BP 124/52 | HR 47 | Temp 98.0°F | Resp 15 | Wt 200.8 lb

## 2018-04-23 DIAGNOSIS — D294 Benign neoplasm of scrotum: Secondary | ICD-10-CM

## 2018-04-23 NOTE — Patient Instructions (Addendum)
Let us know about any new spots or changes. No further treatment is needed.

## 2018-04-23 NOTE — Progress Notes (Signed)
  Subjective:     Patient ID: Joseph [name], male   DOB: [birth year], 84 y.o.   MRN: [id number] Chief Complaint  Patient presents with  . Tick Removal    Patient comes in office today to have tick(s) removed,patient states that  on Saturday he was working out in garden with wife and states that when him and his wife came inside he noticed that he had several ticks on his testicles. Patient states wife tried pulling off one tick of his testicle and reports that he bled for some time afterwards.    HPI Has not noticed spots on his scrotum before.  Review of Systems     Objective:   Physical Exam  Constitutional: He appears well-developed and well-nourished. No distress.  Skin:  Several 1-2 mm medium to dark brown raised, hyperkeratotic lesions on his scrotum. After exam with magnification and manipulation with forceps they are most consistent with angiokeratomas and not ticks.        Assessment:    1. Angiokeratoma of scrotum: reassurance and  Monitor for changes    Plan:    May call for dermatology appointment if wishes further evaluation/treatment.

## 2018-05-08 ENCOUNTER — Ambulatory Visit (INDEPENDENT_AMBULATORY_CARE_PROVIDER_SITE_OTHER): Payer: PPO | Admitting: Family Medicine

## 2018-05-08 VITALS — BP 136/60 | HR 64 | Temp 98.4°F | Resp 14 | Wt 202.0 lb

## 2018-05-08 DIAGNOSIS — M791 Myalgia, unspecified site: Secondary | ICD-10-CM | POA: Diagnosis not present

## 2018-05-08 DIAGNOSIS — Z0001 Encounter for general adult medical examination with abnormal findings: Secondary | ICD-10-CM | POA: Diagnosis not present

## 2018-05-08 DIAGNOSIS — E1151 Type 2 diabetes mellitus with diabetic peripheral angiopathy without gangrene: Secondary | ICD-10-CM | POA: Diagnosis not present

## 2018-05-08 DIAGNOSIS — M79606 Pain in leg, unspecified: Secondary | ICD-10-CM | POA: Diagnosis not present

## 2018-05-08 DIAGNOSIS — I10 Essential (primary) hypertension: Secondary | ICD-10-CM

## 2018-05-08 DIAGNOSIS — E782 Mixed hyperlipidemia: Secondary | ICD-10-CM

## 2018-05-08 DIAGNOSIS — Z0000 Encounter for general adult medical examination without abnormal findings: Secondary | ICD-10-CM

## 2018-05-08 NOTE — Progress Notes (Signed)
Patient: Joseph [name], Male    DOB: 02/09/1925, 84 y.o.   MRN: [id number] Visit Date: 05/08/2018  Today's Provider: Wilhemena Durie, MD   Chief Complaint  Patient presents with  . Annual Exam   Subjective:  Joseph [name] is a 84 y.o. male who presents today for health maintenance and complete physical. He feels well. He reports exercising none. He reports he is sleeping well.   Patient had AWV on 01/08/18  Review of Systems  Constitutional: Negative.   HENT: Positive for nosebleeds.   Eyes: Negative.   Respiratory: Negative.   Cardiovascular: Positive for leg swelling.  Gastrointestinal: Negative.   Endocrine: Negative.   Genitourinary: Negative.   Musculoskeletal: Negative.   Skin: Negative.   Allergic/Immunologic: Negative.   Neurological: Negative.   Hematological: Bruises/bleeds easily.  Psychiatric/Behavioral: Negative.     Social History   Socioeconomic History  . Marital status: Married    Spouse name: Not on file  . Number of children: 3  . Years of education: Not on file  . Highest education level: Some college, no degree  Occupational History  . Occupation: retired  Scientific laboratory technician  . Financial resource strain: Not hard at all  . Food insecurity:    Worry: Never true    Inability: Never true  . Transportation needs:    Medical: No    Non-medical: No  Tobacco Use  . Smoking status: Former Smoker    Packs/day: 0.00    Years: 6.00    Pack years: 0.00    Types: Pipe, Cigars    Last attempt to quit: 12/23/1980    Years since quitting: 37.3  . Smokeless tobacco: Never Used  Substance and Sexual Activity  . Alcohol use: No  . Drug use: No  . Sexual activity: Never  Lifestyle  . Physical activity:    Days per week: Not on file    Minutes per session: Not on file  . Stress: Only a little  Relationships  . Social connections:    Talks on phone: Not on file    Gets together: Not on file    Attends religious service: Not on file    Active  member of club or organization: Not on file    Attends meetings of clubs or organizations: Not on file    Relationship status: Not on file  . Intimate partner violence:    Fear of current or ex partner: Not on file    Emotionally abused: Not on file    Physically abused: Not on file    Forced sexual activity: Not on file  Other Topics Concern  . Not on file  Social History Narrative   Retired.     Patient Active Problem List   Diagnosis Date Noted  . Acute cholecystitis   . Abdominal pain   . Calculus of gallbladder with acute cholecystitis without obstruction   . Diverticulitis of large intestine without perforation or abscess without bleeding   . Pain of upper abdomen   . SOB (shortness of breath)   . S/P CABG (coronary artery bypass graft)   . Morbid obesity due to excess calories ([place])   . Acute respiratory failure with hypoxia ([place]) 03/21/2016  . Back pain, chronic 07/28/2015  . Benign fibroma of prostate 07/28/2015  . Acid reflux 07/28/2015  . Adult hypothyroidism 07/28/2015  . Eunuchoidism 07/28/2015  . Neuropathy 07/28/2015  . Arthritis, degenerative 07/28/2015  . Psoriasis 07/28/2015  . Obesity 09/09/2014  . Bilateral leg weakness  09/09/2014  . Diastolic CHF, acute on chronic ([place]) 05/13/2014  . Bilateral leg edema 04/15/2014  . Bradycardia 08/13/2013  . Edema 06/21/2012  . Fatigue 09/15/2011  . Diabetes mellitus with peripheral vascular disease ([place]) 09/15/2011  . DYSPNEA 06/09/2009  . Hyperlipidemia 06/07/2009  . HYPERTENSION, BENIGN 06/07/2009  . CAD, ARTERY BYPASS GRAFT 06/07/2009    Past Surgical History:  Procedure Laterality Date  . APPENDECTOMY    . CARDIAC CATHETERIZATION  07/2012   [facility]/no stents  . CHOLECYSTECTOMY    . CORNEAL TRANSPLANT    . CORONARY ARTERY BYPASS GRAFT  1994  . HEMORRHOID SURGERY    . PROSTATE BIOPSY    . TOE SURGERY     ingrown toe nail    His family history includes Heart attack in his father; Heart failure in his  maternal grandfather, maternal uncle, and mother; Kidney Stones in his son; Sleep apnea in his daughter and son; Stroke in his brother.     Outpatient Encounter Medications as of 05/08/2018  Medication Sig  . aspirin EC 81 MG tablet Take 1 tablet (81 mg total) by mouth daily.  Marland Kitchen atorvastatin (LIPITOR) 40 MG tablet TAKE ONE TABLET BY MOUTH ONCE DAILY  . blood glucose meter kit and supplies KIT Dispense Accuchek Aviva glucometer with 120 test strips.  Test glucose level 3 times per day with meals and at bedtime.  Dispense based on patient and insurance preference. Use up to four times daily as directed. (FOR ICD-9 250.00, 250.01, ICD 10 E11.9).  . calcipotriene (DOVONOX) 0.005 % cream Apply topically 2 (two) times daily.  . Cholecalciferol (VITAMIN D3) 2000 units TABS Take 1 capsule by mouth daily.  . clobetasol cream (TEMOVATE) 6.43 % Apply 1 application topically 2 (two) times daily.  . Continuous Blood Gluc Sensor (FREESTYLE LIBRE 14 DAY SENSOR) MISC  USE ONE SENSOR EVERY 14 DAYS  . glucose monitoring kit (FREESTYLE) monitoring kit 1 each by Does not apply route as needed for other.  . hydrocortisone 2.5 % cream Apply topically 2 (two) times daily.  . insulin aspart (NOVOLOG) 100 UNIT/ML injection Inject 0-9 Units into the skin 3 (three) times daily with meals. (Patient taking differently: Inject 0-9 Units into the skin as needed. 26 am, 16 lunch, 28 pm)  . insulin glargine (LANTUS) 100 UNIT/ML injection Inject 0.68 mLs (68 Units total) into the skin at bedtime. (Patient taking differently: Inject 40 Units into the skin at bedtime. 16 am and 65 at bedtime)  . isosorbide mononitrate (IMDUR) 30 MG 24 hr tablet Take 1 tablet (30 mg total) by mouth daily.  . isosorbide mononitrate (IMDUR) 30 MG 24 hr tablet TAKE 1 TABLET BY MOUTH ONCE DAILY  . ketoconazole (NIZORAL) 2 % cream Apply 1 application topically 2 (two) times daily as needed for irritation.  Marland Kitchen levothyroxine (SYNTHROID, LEVOTHROID) 50 MCG  tablet TAKE 1 TABLET BY MOUTH ONCE DAILY BEFORE BREAKFAST  . losartan (COZAAR) 100 MG tablet TAKE 1 TABLET BY MOUTH ONCE DAILY  . Magnesium 250 MG TABS Take by mouth daily as needed.  . mometasone (ELOCON) 0.1 % ointment Apply topically daily.  . nitroGLYCERIN (NITROSTAT) 0.4 MG SL tablet Place 1 tablet (0.4 mg total) under the tongue every 5 (five) minutes as needed for chest pain.  Marland Kitchen omeprazole (PRILOSEC) 20 MG capsule TAKE 1 CAPSULE BY MOUTH ONCE DAILY  . potassium chloride (K-DUR) 10 MEQ tablet Take 10 mEq by mouth 2 (two) times daily.  Marland Kitchen torsemide (DEMADEX) 20 MG tablet Take 20  mg by mouth daily.  . vitamin C (ASCORBIC ACID) 500 MG tablet Take 500 mg by mouth daily.   No facility-administered encounter medications on file as of 05/08/2018.     Patient Care Team: [name]., MD as PCP - General (Unknown Physician Specialty) Minna Merritts, MD as Consulting Physician (Cardiology) Samara Deist, DPM as Referring Physician (Podiatry) Solum, Betsey Holiday, MD as Physician Assistant (Endocrinology) Marsh Dolly, MD as Referring Physician (Internal Medicine)      Objective:   Vitals:  Vitals:   05/08/18 1416  BP: 136/60  Pulse: 64  Resp: 14  Temp: 98.4 F (36.9 C)  TempSrc: Oral  SpO2: 97%  Weight: 202 lb (91.6 kg)    Physical Exam  Constitutional: He is oriented to person, place, and time. He appears well-developed and well-nourished.  HENT:  Head: Normocephalic and atraumatic.  Right Ear: External ear normal.  Left Ear: External ear normal.  Nose: Nose normal.  Mouth/Throat: Oropharynx is clear and moist.  Eyes: Pupils are equal, round, and reactive to light. Conjunctivae and EOM are normal.  Neck: Normal range of motion. Neck supple.  Cardiovascular: Normal rate, regular rhythm and intact distal pulses.  Murmur heard. 2/6 holosystolic murmur.  Pulmonary/Chest: Effort normal and breath sounds normal.  Abdominal: Soft. Bowel sounds are normal.  Genitourinary:  Rectum normal, prostate normal and penis normal.  Musculoskeletal: Normal range of motion.  Neurological: He is alert and oriented to person, place, and time.  Skin: Skin is warm and dry.  Psychiatric: He has a normal mood and affect. His behavior is normal. Judgment and thought content normal.     Depression Screen PHQ 2/9 Scores 01/08/2018 08/08/2017 12/23/2016 09/28/2016  PHQ - 2 Score 0 0 0 0  PHQ- 9 Score - 0 - -      Assessment & Plan:     Routine Health Maintenance and Physical Exam  Exercise Activities and Dietary recommendations Goals    . Exercise 3x per week (30 min per time)     Recommend increasing exercise to 3 days a week for at least 30 minutes.        Immunization History  Administered Date(s) Administered  . Influenza Split 08/23/2011  . Influenza, High Dose Seasonal PF 09/01/2015, 08/08/2017  . Influenza,inj,Quad PF,6+ Mos 08/05/2013, 08/04/2014, 08/18/2016  . Pneumococcal Conjugate-13 03/24/2015  . Pneumococcal Polysaccharide-23 10/18/2014  . Tdap 08/04/2014  . Zoster 08/04/2014    Health Maintenance  Topic Date Due  . OPHTHALMOLOGY EXAM  09/21/2017  . HEMOGLOBIN A1C  02/05/2018  . INFLUENZA VACCINE  06/21/2018  . FOOT EXAM  12/19/2018  . TETANUS/TDAP  08/04/2024  . PNA vac Low Risk Adult  Completed     Discussed health benefits of physical activity, and encouraged him to engage in regular exercise appropriate for his age and condition.  CAD HLD  HTN TIIDM Leg Pain/Myalgia Check labs for above.   I have done the exam and reviewed the chart and it is accurate to the best of my knowledge. Development worker, community has been used and  any errors in dictation or transcription are unintentional. Miguel Aschoff M.D. [facility]

## 2018-05-14 DIAGNOSIS — E782 Mixed hyperlipidemia: Secondary | ICD-10-CM | POA: Diagnosis not present

## 2018-05-14 DIAGNOSIS — M791 Myalgia, unspecified site: Secondary | ICD-10-CM | POA: Diagnosis not present

## 2018-05-14 DIAGNOSIS — E1151 Type 2 diabetes mellitus with diabetic peripheral angiopathy without gangrene: Secondary | ICD-10-CM | POA: Diagnosis not present

## 2018-05-14 DIAGNOSIS — I10 Essential (primary) hypertension: Secondary | ICD-10-CM | POA: Diagnosis not present

## 2018-05-14 DIAGNOSIS — M79606 Pain in leg, unspecified: Secondary | ICD-10-CM | POA: Diagnosis not present

## 2018-05-15 LAB — CBC WITH DIFFERENTIAL/PLATELET
Basophils Absolute: 0.1 10*3/uL (ref 0.0–0.2)
Basos: 1 %
EOS (ABSOLUTE): 0.6 10*3/uL — ABNORMAL HIGH (ref 0.0–0.4)
Eos: 9 %
HEMATOCRIT: 40.9 % (ref 37.5–51.0)
HEMOGLOBIN: 13.3 g/dL (ref 13.0–17.7)
IMMATURE GRANULOCYTES: 0 %
Immature Grans (Abs): 0 10*3/uL (ref 0.0–0.1)
Lymphocytes Absolute: 2.6 10*3/uL (ref 0.7–3.1)
Lymphs: 36 %
MCH: 30.6 pg (ref 26.6–33.0)
MCHC: 32.5 g/dL (ref 31.5–35.7)
MCV: 94 fL (ref 79–97)
MONOCYTES: 8 %
Monocytes Absolute: 0.6 10*3/uL (ref 0.1–0.9)
NEUTROS PCT: 46 %
Neutrophils Absolute: 3.2 10*3/uL (ref 1.4–7.0)
Platelets: 181 10*3/uL (ref 150–450)
RBC: 4.35 x10E6/uL (ref 4.14–5.80)
RDW: 14.4 % (ref 12.3–15.4)
WBC: 7.1 10*3/uL (ref 3.4–10.8)

## 2018-05-15 LAB — SEDIMENTATION RATE: SED RATE: 24 mm/h (ref 0–30)

## 2018-05-15 LAB — HEMOGLOBIN A1C
ESTIMATED AVERAGE GLUCOSE: 200 mg/dL
HEMOGLOBIN A1C: 8.6 % — AB (ref 4.8–5.6)

## 2018-05-15 LAB — COMPREHENSIVE METABOLIC PANEL
ALBUMIN: 3.3 g/dL (ref 3.2–4.6)
ALT: 12 IU/L (ref 0–44)
AST: 16 IU/L (ref 0–40)
Albumin/Globulin Ratio: 1.2 (ref 1.2–2.2)
Alkaline Phosphatase: 52 IU/L (ref 39–117)
BUN / CREAT RATIO: 16 (ref 10–24)
BUN: 15 mg/dL (ref 10–36)
Bilirubin Total: 0.6 mg/dL (ref 0.0–1.2)
CALCIUM: 8.8 mg/dL (ref 8.6–10.2)
CO2: 24 mmol/L (ref 20–29)
Chloride: 106 mmol/L (ref 96–106)
Creatinine, Ser: 0.96 mg/dL (ref 0.76–1.27)
GFR calc Af Amer: 80 mL/min/{1.73_m2} (ref >59)
GFR, EST NON AFRICAN AMERICAN: 69 mL/min/{1.73_m2} (ref >59)
GLOBULIN, TOTAL: 2.7 g/dL (ref 1.5–4.5)
Glucose: 151 mg/dL — ABNORMAL HIGH (ref 65–99)
Potassium: 4 mmol/L (ref 3.5–5.2)
SODIUM: 142 mmol/L (ref 134–144)
TOTAL PROTEIN: 6 g/dL (ref 6.0–8.5)

## 2018-05-15 LAB — LIPID PANEL
CHOL/HDL RATIO: 3.6 ratio (ref 0.0–5.0)
Cholesterol, Total: 122 mg/dL (ref 100–199)
HDL: 34 mg/dL — ABNORMAL LOW (ref >39)
LDL CALC: 55 mg/dL (ref 0–99)
Triglycerides: 163 mg/dL — ABNORMAL HIGH (ref 0–149)
VLDL Cholesterol Cal: 33 mg/dL (ref 5–40)

## 2018-05-15 LAB — CK: Total CK: 66 U/L (ref 24–204)

## 2018-05-15 LAB — TSH: TSH: 3.49 u[IU]/mL (ref 0.450–4.500)

## 2018-05-16 NOTE — Progress Notes (Signed)
Advised 

## 2018-05-22 ENCOUNTER — Other Ambulatory Visit: Payer: Self-pay | Admitting: *Deleted

## 2018-05-22 ENCOUNTER — Other Ambulatory Visit: Payer: Self-pay

## 2018-05-22 DIAGNOSIS — E1042 Type 1 diabetes mellitus with diabetic polyneuropathy: Secondary | ICD-10-CM | POA: Diagnosis not present

## 2018-05-22 DIAGNOSIS — B351 Tinea unguium: Secondary | ICD-10-CM | POA: Diagnosis not present

## 2018-05-22 MED ORDER — TORSEMIDE 20 MG PO TABS: 20.0000 mg | ORAL_TABLET | Freq: Every day | ORAL | 0 refills | Status: DC

## 2018-05-22 MED ORDER — FUROSEMIDE 20 MG PO TABS: 20.0000 mg | ORAL_TABLET | ORAL | 3 refills | Status: DC

## 2018-05-22 MED ORDER — TORSEMIDE 20 MG PO TABS: 20.0000 mg | ORAL_TABLET | ORAL | 3 refills | Status: DC

## 2018-05-23 ENCOUNTER — Telehealth: Payer: Self-pay | Admitting: Cardiovascular Disease

## 2018-05-23 NOTE — Telephone Encounter (Signed)
°*  STAT* If patient is at the pharmacy, call can be transferred to refill team.   1. Which medications need to be refilled? (please list name of each medication and dose if known) torsemide (DEMADEX) 20 MG 1 tablet twice daily (bottle says 1 tablet once a day but Dr. Rockey Situ told him to take 2 a day instead)  2. Which pharmacy/location (including street and city if local pharmacy) is medication to be sent to? [facility] on [address]  3. Do they need a 30 day or 90 day supply? 90 day   He has run out of medication early due to taking 2 a day.  Needs description changed on prescription

## 2018-05-23 NOTE — Telephone Encounter (Signed)
Please advise about the directions on patients Torsemide.   Patient states he is take it 2 times a day because Dr. Rockey Situ told him he could.   Most recent prescription sent in was yesterday 05/22/2018:  torsemide (DEMADEX) 20 MG tablet 180 tablet 3 05/22/2018 08/20/2018   Sig - Route: Take 1 tablet (20 mg total) by mouth as directed. Once daily with extra pill as needed for swelling or shortness of breath. - Oral   Sent to pharmacy as: torsemide (DEMADEX) 20 MG tablet   E-Prescribing Status: Receipt confirmed by pharmacy (05/22/2018 4:07 PM EDT)   Pharmacy   [address] [place], [place]

## 2018-05-23 NOTE — Telephone Encounter (Signed)
Spoke with patient to see how he is taking his medication. He reports that he is taking Torsemide 20 mg twice a day and has been on this dose for a while. He is unable to pick up refills due to it being prescribed once daily with extra as needed for swelling and shortness of breath. Also confirmed he is taking potassium twice a day as well. Advised that I would check with provider on his torsemide dosing because at his last visit it was listed for him to take once daily and extra only as needed. He verbalized understanding with no further questions at this time.

## 2018-05-25 MED ORDER — TORSEMIDE 20 MG PO TABS: 20.0000 mg | ORAL_TABLET | Freq: Two times a day (BID) | ORAL | 3 refills | Status: DC

## 2018-05-25 NOTE — Telephone Encounter (Signed)
Renal function does look stable on torsemide 20 twice a day He can continue this current dose

## 2018-05-25 NOTE — Telephone Encounter (Signed)
Spoke with patient and advised that he could continue the torsemide 20 mg twice daily per Dr. Rockey Situ. Prescription updated with no further questions at this time.

## 2018-06-20 DIAGNOSIS — R809 Proteinuria, unspecified: Secondary | ICD-10-CM | POA: Diagnosis not present

## 2018-06-20 DIAGNOSIS — E1159 Type 2 diabetes mellitus with other circulatory complications: Secondary | ICD-10-CM | POA: Diagnosis not present

## 2018-06-20 DIAGNOSIS — E1129 Type 2 diabetes mellitus with other diabetic kidney complication: Secondary | ICD-10-CM | POA: Diagnosis not present

## 2018-06-20 DIAGNOSIS — Z794 Long term (current) use of insulin: Secondary | ICD-10-CM | POA: Diagnosis not present

## 2018-06-20 DIAGNOSIS — E1165 Type 2 diabetes mellitus with hyperglycemia: Secondary | ICD-10-CM | POA: Diagnosis not present

## 2018-08-07 ENCOUNTER — Ambulatory Visit (INDEPENDENT_AMBULATORY_CARE_PROVIDER_SITE_OTHER): Payer: PPO | Admitting: Family Medicine

## 2018-08-07 VITALS — BP 129/66 | HR 43 | Temp 97.9°F | Resp 14 | Wt 203.0 lb

## 2018-08-07 DIAGNOSIS — R350 Frequency of micturition: Secondary | ICD-10-CM | POA: Diagnosis not present

## 2018-08-07 DIAGNOSIS — N419 Inflammatory disease of prostate, unspecified: Secondary | ICD-10-CM

## 2018-08-07 DIAGNOSIS — N200 Calculus of kidney: Secondary | ICD-10-CM | POA: Diagnosis not present

## 2018-08-07 DIAGNOSIS — K5792 Diverticulitis of intestine, part unspecified, without perforation or abscess without bleeding: Secondary | ICD-10-CM

## 2018-08-07 LAB — POCT URINALYSIS DIPSTICK
BILIRUBIN UA: NEGATIVE
Glucose, UA: NEGATIVE
KETONES UA: NEGATIVE
Leukocytes, UA: NEGATIVE
Nitrite, UA: NEGATIVE
PH UA: 6 (ref 5.0–8.0)
Protein, UA: POSITIVE — AB
RBC UA: NEGATIVE
SPEC GRAV UA: 1.025 (ref 1.010–1.025)
UROBILINOGEN UA: 0.2 U/dL

## 2018-08-07 NOTE — Progress Notes (Signed)
Joseph [name]  MRN: [id number] DOB: May 06, 1925  Subjective:  HPI   The patient is a 84 year old male who presents for evaluation of left sided pain that he thinks is possibly a kidney stone.  The patient states that he has had some left sided back pain for several weeks.  Yesterday it started radiating from the back around to the front of his body down the groin and into his testes.  He has had increase in frequency of urinating, decrease in flow, no pain or blood associated when he urinates.    The patient states his blood sugar has been fluctuating from about 61-248.  Currently when he checked it in the office it was 153. No fevers or diarrhea or constipation. Patient Active Problem List   Diagnosis Date Noted  . Acute cholecystitis   . Abdominal pain   . Calculus of gallbladder with acute cholecystitis without obstruction   . Diverticulitis of large intestine without perforation or abscess without bleeding   . Pain of upper abdomen   . SOB (shortness of breath)   . S/P CABG (coronary artery bypass graft)   . Morbid obesity due to excess calories ([place])   . Acute respiratory failure with hypoxia ([place]) 03/21/2016  . Back pain, chronic 07/28/2015  . Benign fibroma of prostate 07/28/2015  . Acid reflux 07/28/2015  . Adult hypothyroidism 07/28/2015  . Eunuchoidism 07/28/2015  . Neuropathy 07/28/2015  . Arthritis, degenerative 07/28/2015  . Psoriasis 07/28/2015  . Obesity 09/09/2014  . Bilateral leg weakness 09/09/2014  . Diastolic CHF, acute on chronic ([place]) 05/13/2014  . Bilateral leg edema 04/15/2014  . Bradycardia 08/13/2013  . Edema 06/21/2012  . Fatigue 09/15/2011  . Diabetes mellitus with peripheral vascular disease ([place]) 09/15/2011  . DYSPNEA 06/09/2009  . Hyperlipidemia 06/07/2009  . HYPERTENSION, BENIGN 06/07/2009  . CAD, ARTERY BYPASS GRAFT 06/07/2009    Past Medical History:  Diagnosis Date  . CAD (coronary artery disease)    a. s/p CABG;  b. 07/2012 Cath:  3VD including 80 dLCX (med Rx), 4/4 patent grafts.  . Chronic diastolic CHF (congestive heart failure) ([place])    a. 07/2012 Echo: EF 55-60%, no rwma, Gr 1 DD, mild MR;  04/2014 Echo: EF 55-60%, no rwma, mild MR, mildly dil LA.  . Diabetes mellitus, type 2 ([place])   . HTN (hypertension)   . Hyperlipidemia   . Hypothyroidism   . Lower extremity edema    a. 03/2014 amlodipine d/c'd.  Marland Kitchen Spinal stenosis     Social History   Socioeconomic History  . Marital status: Married    Spouse name: Not on file  . Number of children: 3  . Years of education: Not on file  . Highest education level: Some college, no degree  Occupational History  . Occupation: retired  Scientific laboratory technician  . Financial resource strain: Not hard at all  . Food insecurity:    Worry: Never true    Inability: Never true  . Transportation needs:    Medical: No    Non-medical: No  Tobacco Use  . Smoking status: Former Smoker    Packs/day: 0.00    Years: 6.00    Pack years: 0.00    Types: Pipe, Cigars    Last attempt to quit: 12/23/1980    Years since quitting: 37.6  . Smokeless tobacco: Never Used  Substance and Sexual Activity  . Alcohol use: No  . Drug use: No  . Sexual activity: Never  Lifestyle  .  Physical activity:    Days per week: Not on file    Minutes per session: Not on file  . Stress: Only a little  Relationships  . Social connections:    Talks on phone: Not on file    Gets together: Not on file    Attends religious service: Not on file    Active member of club or organization: Not on file    Attends meetings of clubs or organizations: Not on file    Relationship status: Not on file  . Intimate partner violence:    Fear of current or ex partner: Not on file    Emotionally abused: Not on file    Physically abused: Not on file    Forced sexual activity: Not on file  Other Topics Concern  . Not on file  Social History Narrative   Retired.     Outpatient Encounter Medications as of 08/07/2018    Medication Sig  . aspirin EC 81 MG tablet Take 1 tablet (81 mg total) by mouth daily.  Marland Kitchen atorvastatin (LIPITOR) 40 MG tablet TAKE ONE TABLET BY MOUTH ONCE DAILY  . blood glucose meter kit and supplies KIT Dispense Accuchek Aviva glucometer with 120 test strips.  Test glucose level 3 times per day with meals and at bedtime.  Dispense based on patient and insurance preference. Use up to four times daily as directed. (FOR ICD-9 250.00, 250.01, ICD 10 E11.9).  . calcipotriene (DOVONOX) 0.005 % cream Apply topically 2 (two) times daily.  . Cholecalciferol (VITAMIN D3) 2000 units TABS Take 1 capsule by mouth daily.  . clobetasol cream (TEMOVATE) 5.85 % Apply 1 application topically 2 (two) times daily.  . Continuous Blood Gluc Sensor (FREESTYLE LIBRE 14 DAY SENSOR) MISC  USE ONE SENSOR EVERY 14 DAYS  . glucose monitoring kit (FREESTYLE) monitoring kit 1 each by Does not apply route as needed for other.  . hydrocortisone 2.5 % cream Apply topically 2 (two) times daily.  . insulin aspart (NOVOLOG) 100 UNIT/ML injection Inject 0-9 Units into the skin 3 (three) times daily with meals. (Patient taking differently: Inject 0-9 Units into the skin as needed. 26 am, 16 lunch, 28 pm)  . insulin glargine (LANTUS) 100 UNIT/ML injection Inject 0.68 mLs (68 Units total) into the skin at bedtime. (Patient taking differently: Inject 40 Units into the skin at bedtime. 16 am and 65 at bedtime)  . isosorbide mononitrate (IMDUR) 30 MG 24 hr tablet TAKE 1 TABLET BY MOUTH ONCE DAILY  . ketoconazole (NIZORAL) 2 % cream Apply 1 application topically 2 (two) times daily as needed for irritation.  Marland Kitchen levothyroxine (SYNTHROID, LEVOTHROID) 50 MCG tablet TAKE 1 TABLET BY MOUTH ONCE DAILY BEFORE BREAKFAST  . losartan (COZAAR) 100 MG tablet TAKE 1 TABLET BY MOUTH ONCE DAILY  . Magnesium 250 MG TABS Take by mouth daily as needed.  . mometasone (ELOCON) 0.1 % ointment Apply topically daily.  . nitroGLYCERIN (NITROSTAT) 0.4 MG SL tablet  Place 1 tablet (0.4 mg total) under the tongue every 5 (five) minutes as needed for chest pain.  Marland Kitchen omeprazole (PRILOSEC) 20 MG capsule TAKE 1 CAPSULE BY MOUTH ONCE DAILY  . potassium chloride (K-DUR) 10 MEQ tablet Take 10 mEq by mouth 2 (two) times daily.  Marland Kitchen torsemide (DEMADEX) 20 MG tablet Take 1 tablet (20 mg total) by mouth 2 (two) times daily.  . vitamin C (ASCORBIC ACID) 500 MG tablet Take 500 mg by mouth daily.  . [DISCONTINUED] isosorbide mononitrate (IMDUR) 30 MG  24 hr tablet Take 1 tablet (30 mg total) by mouth daily.   No facility-administered encounter medications on file as of 08/07/2018.     No Known Allergies  Review of Systems  Constitutional: Negative for fever and malaise/fatigue.  Eyes: Negative.   Respiratory: Negative for cough, shortness of breath and wheezing.   Cardiovascular: Positive for leg swelling. Negative for chest pain, palpitations and orthopnea.  Gastrointestinal: Negative.   Genitourinary: Positive for flank pain and frequency. Negative for dysuria, hematuria and urgency.  Skin: Negative.   Neurological: Negative.   Endo/Heme/Allergies: Negative.   Psychiatric/Behavioral: Negative.     Objective:  BP 129/66 (BP Location: Right Arm, Patient Position: Sitting, Cuff Size: Large)   Pulse (!) 43   Temp 97.9 F (36.6 C) (Oral)   Resp 14   Wt 203 lb (92.1 kg)   SpO2 97%   BMI 31.79 kg/m   Physical Exam  Constitutional: He is oriented to person, place, and time and well-developed, well-nourished, and in no distress.  HENT:  Head: Normocephalic and atraumatic.  Eyes: Conjunctivae are normal. No scleral icterus.  Neck: No thyromegaly present.  Cardiovascular: Normal rate, regular rhythm and normal heart sounds.  Pulmonary/Chest: Effort normal and breath sounds normal.  Abdominal: Soft.  Musculoskeletal: He exhibits no edema.  Neurological: He is alert and oriented to person, place, and time. Gait normal. GCS score is 15.  Skin: Skin is warm and  dry.  Psychiatric: Mood, memory, affect and judgment normal.    Assessment and Plan :  1. Frequency of urination  - POCT urinalysis dipstick  2. Prostatitis, unspecified prostatitis type  - doxycycline (VIBRA-TABS) 100 MG tablet; Take 1 tablet (100 mg total) by mouth 2 (two) times daily.  Dispense: 20 tablet; Refill: 0 - traMADol (ULTRAM) 50 MG tablet; Take 1 tablet (50 mg total) by mouth every 6 (six) hours as needed.  Dispense: 30 tablet; Refill: 0  3. Diverticulitis Possible but renal stone more likely. Cover - doxycycline (VIBRA-TABS) 100 MG tablet; Take 1 tablet (100 mg total) by mouth 2 (two) times daily.  Dispense: 20 tablet; Refill: 0 - traMADol (ULTRAM) 50 MG tablet; Take 1 tablet (50 mg total) by mouth every 6 (six) hours as needed.  Dispense: 30 tablet; Refill: 0  4. Kidney stone  - doxycycline (VIBRA-TABS) 100 MG tablet; Take 1 tablet (100 mg total) by mouth 2 (two) times daily.  Dispense: 20 tablet; Refill: 0 - traMADol (ULTRAM) 50 MG tablet; Take 1 tablet (50 mg total) by mouth every 6 (six) hours as needed.  Dispense: 30 tablet; Refill: 0  I have done the exam and reviewed the chart and it is accurate to the best of my knowledge. Development worker, community has been used and  any errors in dictation or transcription are unintentional. Miguel Aschoff M.D. [facility]

## 2018-08-08 ENCOUNTER — Telehealth: Payer: Self-pay | Admitting: Family Medicine

## 2018-08-08 MED ORDER — DOXYCYCLINE HYCLATE 100 MG PO TABS: 100.0000 mg | ORAL_TABLET | Freq: Two times a day (BID) | ORAL | 0 refills | Status: DC

## 2018-08-08 MED ORDER — TRAMADOL HCL 50 MG PO TABS: 50.0000 mg | ORAL_TABLET | Freq: Four times a day (QID) | ORAL | 0 refills | Status: DC | PRN

## 2018-08-08 NOTE — Telephone Encounter (Signed)
Dr Rosanna Randy to sign

## 2018-08-08 NOTE — Telephone Encounter (Signed)
Pt states he was seen yesterday and was told an RX for pain and an antibiotic would be sent into pharmacy.  States the pharmacy doesn't have the medications yet.  Requesting it be sent into [facility] at [place].

## 2018-09-04 DIAGNOSIS — E1165 Type 2 diabetes mellitus with hyperglycemia: Secondary | ICD-10-CM | POA: Diagnosis not present

## 2018-09-04 DIAGNOSIS — E1159 Type 2 diabetes mellitus with other circulatory complications: Secondary | ICD-10-CM | POA: Diagnosis not present

## 2018-09-18 DIAGNOSIS — E1159 Type 2 diabetes mellitus with other circulatory complications: Secondary | ICD-10-CM | POA: Diagnosis not present

## 2018-09-18 DIAGNOSIS — Z794 Long term (current) use of insulin: Secondary | ICD-10-CM | POA: Diagnosis not present

## 2018-09-18 DIAGNOSIS — E1129 Type 2 diabetes mellitus with other diabetic kidney complication: Secondary | ICD-10-CM | POA: Diagnosis not present

## 2018-09-18 DIAGNOSIS — R809 Proteinuria, unspecified: Secondary | ICD-10-CM | POA: Diagnosis not present

## 2018-10-09 ENCOUNTER — Ambulatory Visit (INDEPENDENT_AMBULATORY_CARE_PROVIDER_SITE_OTHER): Payer: PPO | Admitting: Family Medicine

## 2018-10-09 ENCOUNTER — Encounter: Payer: Self-pay | Admitting: Family Medicine

## 2018-10-09 VITALS — BP 148/72 | HR 52 | Temp 98.1°F | Resp 16 | Ht 67.0 in | Wt 204.0 lb

## 2018-10-09 DIAGNOSIS — E039 Hypothyroidism, unspecified: Secondary | ICD-10-CM

## 2018-10-09 DIAGNOSIS — I10 Essential (primary) hypertension: Secondary | ICD-10-CM | POA: Diagnosis not present

## 2018-10-09 DIAGNOSIS — I25708 Atherosclerosis of coronary artery bypass graft(s), unspecified, with other forms of angina pectoris: Secondary | ICD-10-CM

## 2018-10-09 DIAGNOSIS — E782 Mixed hyperlipidemia: Secondary | ICD-10-CM

## 2018-10-09 DIAGNOSIS — E1151 Type 2 diabetes mellitus with diabetic peripheral angiopathy without gangrene: Secondary | ICD-10-CM

## 2018-10-09 NOTE — Progress Notes (Signed)
Patient: Joseph [name] Male    DOB: 03-31-1925   84 y.o.   MRN: [id number] Visit Date: 10/09/2018  Today's Provider: Wilhemena Durie, MD   Chief Complaint  Patient presents with  . Hypertension  . Hyperlipidemia  . 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  He has been married for 86 years.  He has 7 children 4 of them of died.  3 children still alive and 2 grandchildren. Over  All he feels well and is doing well.  Hypertension, follow-up:  BP Readings from Last 3 Encounters:  10/09/18 (!) 148/72  08/07/18 129/66  05/08/18 136/60    He was last seen for hypertension 4 months ago.  BP at that visit was 136/60. Management since that visit includes no changes. He reports good compliance with treatment. He is not having side effects.  He is not exercising. He is adherent to low salt diet.   Outside blood pressures are checked occasionally. He reports that his BP has averaged in the 140s/60s at home.   He is experiencing none.  Patient denies exertional chest pressure/discomfort, lower extremity edema and palpitations.   Cardiovascular risk factors include diabetes mellitus and dyslipidemia.   Weight trend: stable Wt Readings from Last 3 Encounters:  10/09/18 204 lb (92.5 kg)  08/07/18 203 lb (92.1 kg)  05/08/18 202 lb (91.6 kg)    Current diet: well balanced    Lipid/Cholesterol, Follow-up:   Last seen for this4 months ago.  Management changes since that visit include no changes. . Last Lipid Panel:    Component Value Date/Time   CHOL 122 05/14/2018 0824   TRIG 163 (H) 05/14/2018 0824   HDL 34 (L) 05/14/2018 0824   CHOLHDL 3.6 05/14/2018 0824   LDLCALC 55 05/14/2018 0824    Risk factors for vascular disease include diabetes mellitus and hypertension  He reports good compliance with treatment. He is not having side effects.  Current symptoms include none and have been stable.  Patient also saw  endocrinology since he was here last, and reports that HgbA1c was 8.2 on 09/04/18.   No Known Allergies   Current Outpatient Medications:  .  aspirin EC 81 MG tablet, Take 1 tablet (81 mg total) by mouth daily., Disp: 90 tablet, Rfl: 3 .  atorvastatin (LIPITOR) 40 MG tablet, TAKE ONE TABLET BY MOUTH ONCE DAILY, Disp: 90 tablet, Rfl: 3 .  blood glucose meter kit and supplies KIT, Dispense Accuchek Aviva glucometer with 120 test strips.  Test glucose level 3 times per day with meals and at bedtime.  Dispense based on patient and insurance preference. Use up to four times daily as directed. (FOR ICD-9 250.00, 250.01, ICD 10 E11.9)., Disp: 1 each, Rfl: 0 .  calcipotriene (DOVONOX) 0.005 % cream, Apply topically 2 (two) times daily., Disp: , Rfl:  .  Cholecalciferol (VITAMIN D3) 2000 units TABS, Take 1 capsule by mouth daily., Disp: , Rfl:  .  clobetasol cream (TEMOVATE) 2.45 %, Apply 1 application topically 2 (two) times daily., Disp: , Rfl:  .  Continuous Blood Gluc Sensor (FREESTYLE LIBRE 14 DAY SENSOR) MISC,  USE ONE SENSOR EVERY 14 DAYS, Disp: 2 each, Rfl: 11 .  hydrocortisone 2.5 % cream, Apply topically 2 (two) times daily., Disp: , Rfl:  .  insulin aspart (NOVOLOG) 100 UNIT/ML injection, Inject 0-9 Units into the skin 3 (three) times daily with meals. (Patient taking differently:  Inject 0-9 Units into the skin as needed. 26 am, 16 lunch, 28 pm), Disp: 10 mL, Rfl: 0 .  insulin glargine (LANTUS) 100 UNIT/ML injection, Inject 0.68 mLs (68 Units total) into the skin at bedtime. (Patient taking differently: Inject 40 Units into the skin at bedtime. 16 am and 65 at bedtime), Disp: 10 mL, Rfl: 0 .  isosorbide mononitrate (IMDUR) 30 MG 24 hr tablet, TAKE 1 TABLET BY MOUTH ONCE DAILY, Disp: 30 tablet, Rfl: 6 .  ketoconazole (NIZORAL) 2 % cream, Apply 1 application topically 2 (two) times daily as needed for irritation., Disp: , Rfl:  .  levothyroxine (SYNTHROID, LEVOTHROID) 50 MCG tablet, TAKE 1 TABLET  BY MOUTH ONCE DAILY BEFORE BREAKFAST, Disp: 90 tablet, Rfl: 1 .  losartan (COZAAR) 100 MG tablet, TAKE 1 TABLET BY MOUTH ONCE DAILY, Disp: 90 tablet, Rfl: 3 .  Magnesium 250 MG TABS, Take by mouth daily as needed., Disp: , Rfl:  .  mometasone (ELOCON) 0.1 % ointment, Apply topically daily., Disp: , Rfl:  .  nitroGLYCERIN (NITROSTAT) 0.4 MG SL tablet, Place 1 tablet (0.4 mg total) under the tongue every 5 (five) minutes as needed for chest pain., Disp: 25 tablet, Rfl: 3 .  omeprazole (PRILOSEC) 20 MG capsule, TAKE 1 CAPSULE BY MOUTH ONCE DAILY, Disp: 90 capsule, Rfl: 3 .  potassium chloride (K-DUR) 10 MEQ tablet, Take 10 mEq by mouth 2 (two) times daily., Disp: , Rfl:  .  traMADol (ULTRAM) 50 MG tablet, Take 1 tablet (50 mg total) by mouth every 6 (six) hours as needed., Disp: 30 tablet, Rfl: 0 .  vitamin C (ASCORBIC ACID) 500 MG tablet, Take 500 mg by mouth daily., Disp: , Rfl:  .  doxycycline (VIBRA-TABS) 100 MG tablet, Take 1 tablet (100 mg total) by mouth 2 (two) times daily., Disp: 20 tablet, Rfl: 0 .  glucose monitoring kit (FREESTYLE) monitoring kit, 1 each by Does not apply route as needed for other., Disp: 1 each, Rfl: 0 .  torsemide (DEMADEX) 20 MG tablet, Take 1 tablet (20 mg total) by mouth 2 (two) times daily., Disp: 180 tablet, Rfl: 3  Review of Systems  Constitutional: Negative for activity change, appetite change, chills, diaphoresis, fatigue, fever and unexpected weight change.  HENT: Negative.   Eyes: Negative.   Respiratory: Negative for cough and shortness of breath.   Cardiovascular: Negative for chest pain, palpitations and leg swelling.  Gastrointestinal: Negative.   Endocrine: Negative for cold intolerance, heat intolerance, polydipsia, polyphagia and polyuria.  Allergic/Immunologic: Negative.   Neurological: Negative for dizziness, syncope, weakness, light-headedness and headaches.  Psychiatric/Behavioral: Negative.     Social History   Tobacco Use  . Smoking  status: Former Smoker    Packs/day: 0.00    Years: 6.00    Pack years: 0.00    Types: Pipe, Cigars    Last attempt to quit: 12/23/1980    Years since quitting: 37.8  . Smokeless tobacco: Never Used  Substance Use Topics  . Alcohol use: No   Objective:   BP (!) 148/72 (BP Location: Left Arm, Patient Position: Sitting, Cuff Size: Large)   Pulse (!) 52   Temp 98.1 F (36.7 C)   Resp 16   Ht _0  (1.702 m)   Wt 204 lb (92.5 kg)   SpO2 98%   BMI 31.95 kg/m  Vitals:   10/09/18 1438  BP: (!) 148/72  Pulse: (!) 52  Resp: 16  Temp: 98.1 F (36.7 C)  SpO2: 98%  Weight:  204 lb (92.5 kg)  Height: _0  (1.702 m)     Physical Exam  Constitutional: He is oriented to person, place, and time. He appears well-developed and well-nourished.  HENT:  Head: Normocephalic and atraumatic.  Right Ear: External ear normal.  Left Ear: External ear normal.  Nose: Nose normal.  Eyes: Conjunctivae are normal. No scleral icterus.  Neck: No thyromegaly present.  Cardiovascular: Normal rate, regular rhythm and normal heart sounds.  Pulmonary/Chest: Effort normal and breath sounds normal.  Abdominal: Soft.  Musculoskeletal: He exhibits edema.  2+ edema.  Neurological: He is alert and oriented to person, place, and time.  Skin: Skin is warm and dry.  Psychiatric: He has a normal mood and affect. His behavior is normal. Judgment and thought content normal.        Assessment & Plan:     1. Atherosclerosis of coronary artery bypass graft of native heart with stable angina pectoris ([place]) All risk factors treated  2. HYPERTENSION, BENIGN Stable  3. Adult hypothyroidism   4. Mixed hyperlipidemia Treated  5. Morbid obesity due to excess calories ([place]) I think weight loss would help a little bit but it will be difficult for this patient at 84 years old. 6.  Diabetes per endocrine     I have done the exam and reviewed the above chart and it is accurate to the best of my knowledge.  Development worker, community has been used in this note in any air is in the dictation or transcription are unintentional.  Wilhemena Durie, MD  [place]

## 2018-11-01 NOTE — Progress Notes (Signed)
Cardiology Office Note  Date:  11/02/2018   ID:  Joseph [name], DOB 27-Oct-1925, MRN [id number]  PCP:  [name]., MD   Chief Complaint  Patient presents with  . other    6 mo follow up . Medications reviewed verbally.     HPI:  Joseph [name] is a 84 year old gentleman with a hx of  CAD, CABG in 1994,  PAD ,followed by Dr. Lucky Cowboy,  obesity,  hyperlipidemia,  arthritis of the knees,   fatigue, SOB, leg weakness,  lower extremity edema,  Carotid u/s  Less than 39% stenosis bilaterally presenting for routine followup of his coronary artery disease and  chronic diastolic CHF.   Some medication confusion,  Initially reports he is taking torsemide daily, after back-and-forth discussion,  he is only taking torsemide and potassium as needed for leg swelling He has leg swelling but does not think it is very much Swelling worse on the right than the left  He is concerned about low pulse at home and high blood pressure  Lab work reviewed with him HBA1C 8 in June 2019 Total chol 133, LDL 50  Leg edema better off amlodipine Ran out of imdur  No regular exercise program, legs weak  Denies any significant chest pain or shortness of breath Weight slowly trending downward, several pounds  EKG personally reviewed by myself on todays visit Shows normal sinus rhythm with rate 58 bpm poor R wave progression to the anterior precordial leads, left anterior fascicular block, unable to exclude old anterior MI  Other past medical history reviewed  headache 02/03/2017,  Seen in th ER for H/A 02/03/17 Confused,  In the emergency room had CT head, Showing atherosclerotic calcification of the cavernous carotid arteries bilaterally. Lab work within normal limits No other focal neurologic deficits Patient and family thinks he had a TIA  admission May 2017 for acute cholecystitis, developing acute respiratory distress with hypoxia and acute renal disease, peak creatinine of  2. Notes indicating HIDA scan positive for acute cholecystitis. s/p cholecystostomy tube placed in through interventional radiology 03/24/16 Diuretic was discontinued in the setting of acute renal failure He does report greater than 20 pound weight loss  Echocardiogram 03/22/2016 reviewed with him, ejection fraction 50-55% Chest x-ray 03/29/2016 with small right pleural effusion  Ultrasound of the legs showing patent right SFA stent, left side CFA and popliteal artery with good flow, waveform deterioration around the ankle on the left  Last Cardiac catheterization was performed at [facility] 08/16/2012 Catheterization showed severe three-vessel coronary artery disease with patent grafts x4. There was 80% disease of a small to moderate sized distal left circumflex. Ejection fraction 45-50%. No significant aortic valve stenosis or mitral valve regurgitation. The findings were discussed with interventional cardiology and medical management was recommended for the distal left circumflex disease.  previous Echocardiogram did not show elevated right-sided pressures and ejection fraction was normal. Previous episode of tachycardia in June 2013 that resolved without intervention  PMH:   has a past medical history of CAD (coronary artery disease), Chronic diastolic CHF (congestive heart failure) ([place]), Diabetes mellitus, type 2 ([place]), HTN (hypertension), Hyperlipidemia, Hypothyroidism, Lower extremity edema, and Spinal stenosis.  PSH:    Past Surgical History:  Procedure Laterality Date  . APPENDECTOMY    . CARDIAC CATHETERIZATION  07/2012   [facility]/no stents  . CHOLECYSTECTOMY    . CORNEAL TRANSPLANT    . CORONARY ARTERY BYPASS GRAFT  1994  . HEMORRHOID SURGERY    . PROSTATE BIOPSY    .  TOE SURGERY     ingrown toe nail   Current Outpatient Medications on File Prior to Visit  Medication Sig Dispense Refill  . aspirin EC 81 MG tablet Take 1 tablet (81 mg total) by mouth daily. 90 tablet 3  .  atorvastatin (LIPITOR) 40 MG tablet TAKE ONE TABLET BY MOUTH ONCE DAILY 90 tablet 3  . blood glucose meter kit and supplies KIT Dispense Accuchek Aviva glucometer with 120 test strips.  Test glucose level 3 times per day with meals and at bedtime.  Dispense based on patient and insurance preference. Use up to four times daily as directed. (FOR ICD-9 250.00, 250.01, ICD 10 E11.9). 1 each 0  . Cholecalciferol (VITAMIN D3) 2000 units TABS Take 1 capsule by mouth daily.    . clobetasol cream (TEMOVATE) 1.61 % Apply 1 application topically 2 (two) times daily.    . Continuous Blood Gluc Sensor (FREESTYLE LIBRE 14 DAY SENSOR) MISC  USE ONE SENSOR EVERY 14 DAYS 2 each 11  . glucose monitoring kit (FREESTYLE) monitoring kit 1 each by Does not apply route as needed for other. 1 each 0  . insulin aspart (NOVOLOG) 100 UNIT/ML injection Inject 0-9 Units into the skin 3 (three) times daily with meals. (Patient taking differently: Inject 0-9 Units into the skin as needed. 15 am, 15 lunch,15 pm) 10 mL 0  . insulin glargine (LANTUS) 100 UNIT/ML injection Inject 0.68 mLs (68 Units total) into the skin at bedtime. (Patient taking differently: Inject 42 Units into the skin at bedtime. 16 am and 65 at bedtime) 10 mL 0  . isosorbide mononitrate (IMDUR) 30 MG 24 hr tablet TAKE 1 TABLET BY MOUTH ONCE DAILY 30 tablet 6  . levothyroxine (SYNTHROID, LEVOTHROID) 50 MCG tablet TAKE 1 TABLET BY MOUTH ONCE DAILY BEFORE BREAKFAST 90 tablet 1  . losartan (COZAAR) 100 MG tablet TAKE 1 TABLET BY MOUTH ONCE DAILY 90 tablet 3  . Magnesium 250 MG TABS Take by mouth daily as needed.    . mometasone (ELOCON) 0.1 % ointment Apply topically daily.    Marland Kitchen omeprazole (PRILOSEC) 20 MG capsule TAKE 1 CAPSULE BY MOUTH ONCE DAILY 90 capsule 3  . potassium chloride (K-DUR) 10 MEQ tablet Take 10 mEq by mouth 2 (two) times daily.    . traMADol (ULTRAM) 50 MG tablet Take 1 tablet (50 mg total) by mouth every 6 (six) hours as needed. 30 tablet 0  .  vitamin C (ASCORBIC ACID) 500 MG tablet Take 500 mg by mouth daily.    . calcipotriene (DOVONOX) 0.005 % cream Apply topically 2 (two) times daily.    . hydrocortisone 2.5 % cream Apply topically 2 (two) times daily.    Marland Kitchen ketoconazole (NIZORAL) 2 % cream Apply 1 application topically 2 (two) times daily as needed for irritation.    . nitroGLYCERIN (NITROSTAT) 0.4 MG SL tablet Place 1 tablet (0.4 mg total) under the tongue every 5 (five) minutes as needed for chest pain. (Patient not taking: Reported on 11/02/2018) 25 tablet 3  . torsemide (DEMADEX) 20 MG tablet Take 1 tablet (20 mg total) by mouth 2 (two) times daily. 180 tablet 3   No current facility-administered medications on file prior to visit.     Allergies:   Patient has no known allergies.   Social History:  The patient  reports that he quit smoking about 37 years ago. His smoking use included pipe and cigars. He smoked 0.00 packs per day for 6.00 years. He has never used  smokeless tobacco. He reports that he does not drink alcohol or use drugs.   Family History:   family history includes Heart attack in his father; Heart failure in his maternal grandfather, maternal uncle, and mother; Kidney Stones in his son; Sleep apnea in his daughter and son; Stroke in his brother.    Review of Systems: Review of Systems  Constitutional: Negative.        Gait instability  Respiratory: Negative.   Cardiovascular: Positive for leg swelling.  Gastrointestinal: Negative.   Musculoskeletal: Negative.   Neurological: Negative.   Psychiatric/Behavioral: Negative.   All other systems reviewed and are negative.    PHYSICAL EXAM: VS:  BP (!) 160/66 (BP Location: Left Arm, Patient Position: Sitting, Cuff Size: Normal)   Pulse (!) 58   Ht 5' 6"  (1.676 m)   Wt 200 lb (90.7 kg)   BMI 32.28 kg/m  , BMI Body mass index is 32.28 kg/m. Constitutional:  oriented to person, place, and time. No distress.  Obese HENT:  Head: Grossly normal Eyes:  no  discharge. No scleral icterus.  Neck: No JVD, no carotid bruits  Cardiovascular: Regular rate and rhythm, no murmurs appreciated Pulmonary/Chest: Clear to auscultation bilaterally, no wheezes or rails Abdominal: Soft.  no distension.  no tenderness.  Musculoskeletal: Normal range of motion Neurological:  normal muscle tone. Coordination normal. No atrophy Skin: Skin warm and dry Psychiatric: normal affect, pleasant   Recent Labs: 05/14/2018: ALT 12; BUN 15; Creatinine, Ser 0.96; Hemoglobin 13.3; Platelets 181; Potassium 4.0; Sodium 142; TSH 3.490    Lipid Panel Lab Results  Component Value Date   CHOL 122 05/14/2018   HDL 34 (L) 05/14/2018   LDLCALC 55 05/14/2018   TRIG 163 (H) 05/14/2018      Wt Readings from Last 3 Encounters:  11/02/18 200 lb (90.7 kg)  10/09/18 204 lb (92.5 kg)  08/07/18 203 lb (92.1 kg)     ASSESSMENT AND PLAN:   Mixed hyperlipidemia -  Cholesterol is at goal on the current lipid regimen. No changes to the medications were made.  Stable  HYPERTENSION, BENIGN -  Blood pressure running high Amlodipine previously held for leg swelling with improvement of his symptoms He is only taking torsemide as needed not on a regular basis We have refilled his isosorbide 30 mg daily We will change losartan to losartan HCTZ 100/25 mg daily with potassium 10 daily Stressed importance of medication compliance BMP in 1 month  Atherosclerosis of coronary artery bypass graft of native heart with stable angina pectoris (HCC) -  Currently with no symptoms of angina. No further workup at this time. Continue current medication regimen.  Stable  Diabetes mellitus with peripheral vascular disease (HCC) -  Weight still elevated but improving Recommend a low carbohydrate diet  Diastolic CHF, acute on chronic (HCC) -  HCTZ and potassium added as he is not taking his torsemide on a regular basis High risk of recurrent diastolic CHF  SOB (shortness of breath) -  Secondary  to fluid, deconditioning, obesity Recommended weight loss and walking program  Morbid obesity due to excess calories (HCC) -  As above weight loss and walking program  Bilateral leg edema -  Symptoms improved by holding amlodipine still with trace pitting edema mid shins HCTZ added  PAD (peripheral artery disease) ([place]) -  Stressed importance of aggressive diabetes control, low cholesterol   Total encounter time more than 25 minutes  Greater than 50% was spent in counseling and coordination of care with  the patient   Disposition:   F/U 6 month   Orders Placed This Encounter  Procedures  . EKG 12-Lead     Signed, Esmond Plants, M.D., Ph.D. 11/02/2018  [place], [place]

## 2018-11-02 ENCOUNTER — Ambulatory Visit (INDEPENDENT_AMBULATORY_CARE_PROVIDER_SITE_OTHER): Payer: PPO | Admitting: Cardiovascular Disease

## 2018-11-02 ENCOUNTER — Encounter: Payer: Self-pay | Admitting: Cardiovascular Disease

## 2018-11-02 VITALS — BP 160/66 | HR 58 | Ht 66.0 in | Wt 200.0 lb

## 2018-11-02 DIAGNOSIS — I25708 Atherosclerosis of coronary artery bypass graft(s), unspecified, with other forms of angina pectoris: Secondary | ICD-10-CM

## 2018-11-02 DIAGNOSIS — Z951 Presence of aortocoronary bypass graft: Secondary | ICD-10-CM | POA: Diagnosis not present

## 2018-11-02 DIAGNOSIS — R0602 Shortness of breath: Secondary | ICD-10-CM

## 2018-11-02 DIAGNOSIS — E782 Mixed hyperlipidemia: Secondary | ICD-10-CM | POA: Diagnosis not present

## 2018-11-02 DIAGNOSIS — E1151 Type 2 diabetes mellitus with diabetic peripheral angiopathy without gangrene: Secondary | ICD-10-CM | POA: Diagnosis not present

## 2018-11-02 DIAGNOSIS — R600 Localized edema: Secondary | ICD-10-CM | POA: Diagnosis not present

## 2018-11-02 DIAGNOSIS — I5033 Acute on chronic diastolic (congestive) heart failure: Secondary | ICD-10-CM | POA: Diagnosis not present

## 2018-11-02 DIAGNOSIS — I10 Essential (primary) hypertension: Secondary | ICD-10-CM | POA: Diagnosis not present

## 2018-11-02 MED ORDER — POTASSIUM CHLORIDE ER 10 MEQ PO TBCR: 10.0000 meq | EXTENDED_RELEASE_TABLET | ORAL | 3 refills | Status: DC

## 2018-11-02 MED ORDER — LOSARTAN POTASSIUM-HCTZ 100-25 MG PO TABS: 1.0000 | ORAL_TABLET | Freq: Every day | ORAL | 3 refills | Status: DC

## 2018-11-02 MED ORDER — TORSEMIDE 20 MG PO TABS: 20.0000 mg | ORAL_TABLET | ORAL | 3 refills | Status: DC | PRN

## 2018-11-02 MED ORDER — ISOSORBIDE MONONITRATE ER 30 MG PO TB24: 30.0000 mg | ORAL_TABLET | Freq: Every day | ORAL | 4 refills | Status: DC

## 2018-11-02 NOTE — Patient Instructions (Addendum)
Medication Instructions:  Your physician has recommended you make the following change in your medication:  1. STOP Losartan 2. START Losartan-Hydrochlorothiazide 100-25 mg Once daily 3. START Potassium 10 mEq once daily and take extra pill if you take torsemide 4. TAKE Torsemide 20 mg as needed for swelling or shortness of breath. Make sure to take extra potassium 10 mEq with this.     If you need a refill on your cardiac medications before your next appointment, please call your pharmacy.    Lab work: BMP in one month   If you have labs (blood work) drawn today and your tests are completely normal, you will receive your results only by: Marland Kitchen MyChart Message (if you have MyChart) OR . A paper copy in the mail If you have any lab test that is abnormal or we need to change your treatment, we will call you to review the results.   Testing/Procedures: No new testing needed   Follow-Up: At [facility], you and your health needs are our priority.  As part of our continuing mission to provide you with exceptional heart care, we have created designated Provider Care Teams.  These Care Teams include your primary Cardiologist (physician) and Advanced Practice Providers (APPs -  Physician Assistants and Nurse Practitioners) who all work together to provide you with the care you need, when you need it.  . You will need a follow up appointment in 6 months .   Please call our office 2 months in advance to schedule this appointment.    . Providers on your designated Care Team:   . Murray Hodgkins, NP . Christell Faith, PA-C . Marrianne Mood, PA-C  Any Other Special Instructions Will Be Listed Below (If Applicable).  For educational health videos Log in to : [email or web address] Or : [email or web address], password : [email or web address]

## 2018-11-20 ENCOUNTER — Other Ambulatory Visit: Payer: Self-pay | Admitting: Family Medicine

## 2018-11-27 DIAGNOSIS — E1042 Type 1 diabetes mellitus with diabetic polyneuropathy: Secondary | ICD-10-CM | POA: Diagnosis not present

## 2018-11-27 DIAGNOSIS — B351 Tinea unguium: Secondary | ICD-10-CM | POA: Diagnosis not present

## 2018-12-03 ENCOUNTER — Other Ambulatory Visit: Payer: PPO

## 2018-12-05 ENCOUNTER — Telehealth: Payer: Self-pay | Admitting: Cardiovascular Disease

## 2018-12-05 NOTE — Telephone Encounter (Signed)
Pt c/o BP issue: STAT if pt c/o blurred vision, one-sided weakness or slurred speech  1. What are your last 5 BP readings?  12/30 164/62 1/1 184/67 1/3 152/69 1/ 7 140/64 1/8 160/64  2. Are you having any other symptoms (ex. Dizziness, headache, blurred vision, passed out)? no  3. What is your BP issue? Blood pressure has been running high.  Patient has an appointment for [facility] tomorrow.

## 2018-12-06 ENCOUNTER — Other Ambulatory Visit (INDEPENDENT_AMBULATORY_CARE_PROVIDER_SITE_OTHER): Payer: PPO

## 2018-12-06 DIAGNOSIS — I25708 Atherosclerosis of coronary artery bypass graft(s), unspecified, with other forms of angina pectoris: Secondary | ICD-10-CM

## 2018-12-06 NOTE — Telephone Encounter (Signed)
Call attempted, LMTCB 

## 2018-12-07 LAB — BASIC METABOLIC PANEL
BUN / CREAT RATIO: 11 (ref 10–24)
BUN: 10 mg/dL (ref 10–36)
CALCIUM: 8.7 mg/dL (ref 8.6–10.2)
CHLORIDE: 103 mmol/L (ref 96–106)
CO2: 26 mmol/L (ref 20–29)
CREATININE: 0.89 mg/dL (ref 0.76–1.27)
GFR calc non Af Amer: 75 mL/min/{1.73_m2} (ref >59)
GFR, EST AFRICAN AMERICAN: 86 mL/min/{1.73_m2} (ref >59)
Glucose: 213 mg/dL — ABNORMAL HIGH (ref 65–99)
Potassium: 3.8 mmol/L (ref 3.5–5.2)
Sodium: 142 mmol/L (ref 134–144)

## 2018-12-10 NOTE — Telephone Encounter (Signed)
Called to discuss lab results with patient. He expressed understanding and will call PCP in relation to high blood glucose.   He also reported that BP had remained high over the weekend despite taking all ordered medications.   BP 1/17 140/64        1/18 160/64        1/19 180/77  Pt does endorse eating out some this weekend but doesn't feel hes had more salt than normal. He reported 2 pound weight gain over the past month but denies swelling or SOB.  Message routed to provider for further advice.   Advised pt to call for any further questions or concerns.

## 2018-12-11 NOTE — Telephone Encounter (Signed)
Hx of medication noncompliance Runs out of meds, as detailed on last clinic visit one month ago Would have him read back what he is taking Would ask about torsemide

## 2018-12-12 MED ORDER — HYDROCHLOROTHIAZIDE 25 MG PO TABS: 25.0000 mg | ORAL_TABLET | Freq: Every day | ORAL | 3 refills | Status: DC

## 2018-12-12 MED ORDER — LOSARTAN POTASSIUM 100 MG PO TABS: 100.0000 mg | ORAL_TABLET | Freq: Every day | ORAL | 3 refills | Status: DC

## 2018-12-12 MED ORDER — LOSARTAN POTASSIUM-HCTZ 50-12.5 MG PO TABS: 2.0000 | ORAL_TABLET | Freq: Every day | ORAL | 3 refills | Status: DC

## 2018-12-12 NOTE — Telephone Encounter (Signed)
Called pharmacy at [facility] and they are not able to get combination medication at this time and are requesting two different prescriptions. Sent another note to provider for recommendations.

## 2018-12-12 NOTE — Telephone Encounter (Signed)
Spoke with patient and reviewed blood pressure readings and medications. He states that he noticed one of his prescriptions was changed and he has not been taking the losartan-hydrochlorothiazide 100-25 mg because they don't have any at the pharmacy. Advised that I would send provider a message to see if I can send in two separate prescriptions and would give him a call back with any changes we make. He verbalized understanding with no further questions at this time.

## 2018-12-12 NOTE — Telephone Encounter (Signed)
Spoke with patient and reviewed that lower dose sent in with instructions to take 2 tablets once daily. Advised that he please call us back if he should have any problems getting this filled. He verbalized understanding with no further questions at this time.

## 2018-12-12 NOTE — Telephone Encounter (Signed)
Spoke with patient and reviewed that pharmacy does not have any of the combination medications. Sent in two different prescriptions and reviewed this information with him in detail. He verbalized understanding with no further questions at this time.

## 2018-12-19 DIAGNOSIS — Z794 Long term (current) use of insulin: Secondary | ICD-10-CM | POA: Diagnosis not present

## 2018-12-19 DIAGNOSIS — R809 Proteinuria, unspecified: Secondary | ICD-10-CM | POA: Diagnosis not present

## 2018-12-19 DIAGNOSIS — E1129 Type 2 diabetes mellitus with other diabetic kidney complication: Secondary | ICD-10-CM | POA: Diagnosis not present

## 2018-12-26 DIAGNOSIS — E1129 Type 2 diabetes mellitus with other diabetic kidney complication: Secondary | ICD-10-CM | POA: Diagnosis not present

## 2018-12-26 DIAGNOSIS — E1159 Type 2 diabetes mellitus with other circulatory complications: Secondary | ICD-10-CM | POA: Diagnosis not present

## 2018-12-26 DIAGNOSIS — Z794 Long term (current) use of insulin: Secondary | ICD-10-CM | POA: Diagnosis not present

## 2018-12-26 DIAGNOSIS — E1165 Type 2 diabetes mellitus with hyperglycemia: Secondary | ICD-10-CM | POA: Diagnosis not present

## 2018-12-26 DIAGNOSIS — R809 Proteinuria, unspecified: Secondary | ICD-10-CM | POA: Diagnosis not present

## 2018-12-31 ENCOUNTER — Other Ambulatory Visit: Payer: Self-pay | Admitting: Family Medicine

## 2019-01-21 ENCOUNTER — Other Ambulatory Visit: Payer: Self-pay | Admitting: Cardiovascular Disease

## 2019-01-22 ENCOUNTER — Other Ambulatory Visit: Payer: Self-pay | Admitting: Family Medicine

## 2019-01-22 DIAGNOSIS — E1151 Type 2 diabetes mellitus with diabetic peripheral angiopathy without gangrene: Secondary | ICD-10-CM

## 2019-01-29 ENCOUNTER — Ambulatory Visit (INDEPENDENT_AMBULATORY_CARE_PROVIDER_SITE_OTHER): Payer: PPO

## 2019-01-29 VITALS — BP 154/56 | HR 72 | Temp 97.6°F | Ht 66.0 in | Wt 201.8 lb

## 2019-01-29 DIAGNOSIS — Z0000 Encounter for general adult medical examination without abnormal findings: Secondary | ICD-10-CM | POA: Diagnosis not present

## 2019-01-29 NOTE — Patient Instructions (Signed)
Joseph [name] , Thank you for taking time to come for your Medicare Wellness Visit. I appreciate your ongoing commitment to your health goals. Please review the following plan we discussed and let me know if I can assist you in the future.   Screening recommendations/referrals: Colonoscopy: No longer required.  Recommended yearly ophthalmology/optometry visit for glaucoma screening and checkup Recommended yearly dental visit for hygiene and checkup  Vaccinations: Influenza vaccine: Up to date Pneumococcal vaccine: Completed series Tdap vaccine: Up to date, due 07/2024 Shingles vaccine: Pt declines today.     Advanced directives: Please bring a copy of your POA (Power of Attorney) and/or Living Will to your next appointment.   Conditions/risks identified: Continue to increase water intake and walk 3 days a week for at least 30 minutes at a time.   Next appointment: 04/10/19 with Dr Rosanna Randy.  Preventive Care 43 Years and Older, Male Preventive care refers to lifestyle choices and visits with your health care provider that can promote health and wellness. What does preventive care include?  A yearly physical exam. This is also called an annual well check.  Dental exams once or twice a year.  Routine eye exams. Ask your health care provider how often you should have your eyes checked.  Personal lifestyle choices, including:  Daily care of your teeth and gums.  Regular physical activity.  Eating a healthy diet.  Avoiding tobacco and drug use.  Limiting alcohol use.  Practicing safe sex.  Taking low doses of aspirin every day.  Taking vitamin and mineral supplements as recommended by your health care provider. What happens during an annual well check? The services and screenings done by your health care provider during your annual well check will depend on your age, overall health, lifestyle risk factors, and family history of disease. Counseling  Your health care provider  may ask you questions about your:  Alcohol use.  Tobacco use.  Drug use.  Emotional well-being.  Home and relationship well-being.  Sexual activity.  Eating habits.  History of falls.  Memory and ability to understand (cognition).  Work and work Statistician. Screening  You may have the following tests or measurements:  Height, weight, and BMI.  Blood pressure.  Lipid and cholesterol levels. These may be checked every 5 years, or more frequently if you are over 43 years old.  Skin check.  Lung cancer screening. You may have this screening every year starting at age 31 if you have a 30-pack-year history of smoking and currently smoke or have quit within the past 15 years.  Fecal occult blood test (FOBT) of the stool. You may have this test every year starting at age 47.  Flexible sigmoidoscopy or colonoscopy. You may have a sigmoidoscopy every 5 years or a colonoscopy every 10 years starting at age 69.  Prostate cancer screening. Recommendations will vary depending on your family history and other risks.  Hepatitis C blood test.  Hepatitis B blood test.  Sexually transmitted disease (STD) testing.  Diabetes screening. This is done by checking your blood sugar (glucose) after you have not eaten for a while (fasting). You may have this done every 1-3 years.  Abdominal aortic aneurysm (AAA) screening. You may need this if you are a current or former smoker.  Osteoporosis. You may be screened starting at age 70 if you are at high risk. Talk with your health care provider about your test results, treatment options, and if necessary, the need for more tests. Vaccines  Your health  care provider may recommend certain vaccines, such as:  Influenza vaccine. This is recommended every year.  Tetanus, diphtheria, and acellular pertussis (Tdap, Td) vaccine. You may need a Td booster every 10 years.  Zoster vaccine. You may need this after age 23.  Pneumococcal 13-valent  conjugate (PCV13) vaccine. One dose is recommended after age 79.  Pneumococcal polysaccharide (PPSV23) vaccine. One dose is recommended after age 70. Talk to your health care provider about which screenings and vaccines you need and how often you need them. This information is not intended to replace advice given to you by your health care provider. Make sure you discuss any questions you have with your health care provider. Document Released: 12/04/2015 Document Revised: 07/27/2016 Document Reviewed: 09/08/2015 Elsevier Interactive Patient Education  2017 [place] Prevention in the Home Falls can cause injuries. They can happen to people of all ages. There are many things you can do to make your home safe and to help prevent falls. What can I do on the outside of my home?  Regularly fix the edges of walkways and driveways and fix any cracks.  Remove anything that might make you trip as you walk through a door, such as a raised step or threshold.  Trim any bushes or trees on the path to your home.  Use bright outdoor lighting.  Clear any walking paths of anything that might make someone trip, such as rocks or tools.  Regularly check to see if handrails are loose or broken. Make sure that both sides of any steps have handrails.  Any raised decks and porches should have guardrails on the edges.  Have any leaves, snow, or ice cleared regularly.  Use sand or salt on walking paths during winter.  Clean up any spills in your garage right away. This includes oil or grease spills. What can I do in the bathroom?  Use night lights.  Install grab bars by the toilet and in the tub and shower. Do not use towel bars as grab bars.  Use non-skid mats or decals in the tub or shower.  If you need to sit down in the shower, use a plastic, non-slip stool.  Keep the floor dry. Clean up any water that spills on the floor as soon as it happens.  Remove soap buildup in the tub or  shower regularly.  Attach bath mats securely with double-sided non-slip rug tape.  Do not have throw rugs and other things on the floor that can make you trip. What can I do in the bedroom?  Use night lights.  Make sure that you have a light by your bed that is easy to reach.  Do not use any sheets or blankets that are too big for your bed. They should not hang down onto the floor.  Have a firm chair that has side arms. You can use this for support while you get dressed.  Do not have throw rugs and other things on the floor that can make you trip. What can I do in the kitchen?  Clean up any spills right away.  Avoid walking on wet floors.  Keep items that you use a lot in easy-to-reach places.  If you need to reach something above you, use a strong step stool that has a grab bar.  Keep electrical cords out of the way.  Do not use floor polish or wax that makes floors slippery. If you must use wax, use non-skid floor wax.  Do not have  throw rugs and other things on the floor that can make you trip. What can I do with my stairs?  Do not leave any items on the stairs.  Make sure that there are handrails on both sides of the stairs and use them. Fix handrails that are broken or loose. Make sure that handrails are as long as the stairways.  Check any carpeting to make sure that it is firmly attached to the stairs. Fix any carpet that is loose or worn.  Avoid having throw rugs at the top or bottom of the stairs. If you do have throw rugs, attach them to the floor with carpet tape.  Make sure that you have a light switch at the top of the stairs and the bottom of the stairs. If you do not have them, ask someone to add them for you. What else can I do to help prevent falls?  Wear shoes that:  Do not have high heels.  Have rubber bottoms.  Are comfortable and fit you well.  Are closed at the toe. Do not wear sandals.  If you use a stepladder:  Make sure that it is fully  opened. Do not climb a closed stepladder.  Make sure that both sides of the stepladder are locked into place.  Ask someone to hold it for you, if possible.  Clearly mark and make sure that you can see:  Any grab bars or handrails.  First and last steps.  Where the edge of each step is.  Use tools that help you move around (mobility aids) if they are needed. These include:  Canes.  Walkers.  Scooters.  Crutches.  Turn on the lights when you go into a dark area. Replace any light bulbs as soon as they burn out.  Set up your furniture so you have a clear path. Avoid moving your furniture around.  If any of your floors are uneven, fix them.  If there are any pets around you, be aware of where they are.  Review your medicines with your doctor. Some medicines can make you feel dizzy. This can increase your chance of falling. Ask your doctor what other things that you can do to help prevent falls. This information is not intended to replace advice given to you by your health care provider. Make sure you discuss any questions you have with your health care provider. Document Released: 09/03/2009 Document Revised: 04/14/2016 Document Reviewed: 12/12/2014 Elsevier Interactive Patient Education  2017 [employer].

## 2019-01-29 NOTE — Progress Notes (Signed)
Subjective:   Joseph [name] is a 85 y.o. male who presents for Medicare Annual/Subsequent preventive examination.  Review of Systems:  N/A  Cardiac Risk Factors include: advanced age (>45mn, >>82women);diabetes mellitus;dyslipidemia;hypertension;male gender;obesity (BMI >30kg/m2)     Objective:    Vitals: BP (!) 154/56 (BP Location: Right Arm)   Pulse 72   Temp 97.6 F (36.4 C) (Oral)   Ht _0  (1.676 m)   Wt 201 lb 12.8 oz (91.5 kg)   BMI 32.57 kg/m   Body mass index is 32.57 kg/m.  Advanced Directives 01/29/2019 01/08/2018 08/08/2017 02/03/2017 12/23/2016 03/25/2016 03/21/2016  Does Patient Have a Medical Advance Directive? Yes No Yes No Yes No Yes  Type of [place] - Living will - Living [place] - [place] Does patient want to make changes to medical advance directive? - No - Patient declined - - - - No - Patient declined  [place] of [place]? No - copy requested - - - No - copy requested - No - copy requested  Would patient like information on creating a medical advance directive? - - - No - Patient declined - No - patient declined information -    Tobacco Social History   Tobacco Use  Smoking Status Former Smoker  . Packs/day: 0.00  . Years: 6.00  . Pack years: 0.00  . Types: Pipe, Cigars  . Last attempt to quit: 12/23/1980  . Years since quitting: 38.1  Smokeless Tobacco Never Used     Counseling given: Not Answered   Clinical Intake:  Pre-visit preparation completed: Yes  Pain : No/denies pain Pain Score: 0-No pain    Diabetes:  Is the patient diabetic?  Yes  If diabetic, was a CBG obtained today?  No  Did the patient bring in their glucometer from home?  No  How often do you monitor your CBG's? Four times a day or more.   Financial Strains and Diabetes Management:  Are you having any financial strains with the device, your supplies or your medication? No .    Does the patient want to be seen by Chronic Care Management for management of their diabetes?  No  Would the patient like to be referred to a Nutritionist or for Diabetic Management?  No   Diabetic Exams:  Diabetic Eye Exam: Completed 12/2018. [facility]   Diabetic Foot Exam: Completed 12/26/18.    Nutritional Status: BMI > 30  Obese Nutritional Risks: None   How often do you need to have someone help you when you read instructions, pamphlets, or other written materials from your doctor or pharmacy?: 1 - Never  Interpreter Needed?: No  Information entered by :: [facility] LPN  Past Medical History:  Diagnosis Date  . CAD (coronary artery disease)    a. s/p CABG;  b. 07/2012 Cath: 3VD including 80 dLCX (med Rx), 4/4 patent grafts.  . Chronic diastolic CHF (congestive heart failure) ([place]    a. 07/2012 Echo: EF 55-60%, no rwma, Gr 1 DD, mild MR;  04/2014 Echo: EF 55-60%, no rwma, mild MR, mildly dil LA.  . Diabetes mellitus, type 2 ([place]   . HTN (hypertension)   . Hyperlipidemia   . Hypothyroidism   . Lower extremity edema    a. 03/2014 amlodipine d/c'd.  .Marland KitchenSpinal stenosis    Past Surgical History:  Procedure Laterality Date  .  APPENDECTOMY    . CARDIAC CATHETERIZATION  07/2012   [facility]/no stents  . CHOLECYSTECTOMY    . CORNEAL TRANSPLANT    . CORONARY ARTERY BYPASS GRAFT  1994  . HEMORRHOID SURGERY    . PROSTATE BIOPSY    . TOE SURGERY     ingrown toe nail   Family History  Problem Relation Age of Onset  . Heart failure Mother   . Heart attack Father   . Stroke Brother   . Heart failure Maternal Uncle   . Heart failure Maternal Grandfather   . Sleep apnea Daughter   . Sleep apnea Son   . Kidney Stones Son    Social History   Socioeconomic History  . Marital status: Married    Spouse name: Not on file  . Number of children: 3  . Years of education: Not on file  . Highest education level: Some college, no  degree  Occupational History  . Occupation: retired  Scientific laboratory technician  . Financial resource strain: Not hard at all  . Food insecurity:    Worry: Never true    Inability: Never true  . Transportation needs:    Medical: No    Non-medical: No  Tobacco Use  . Smoking status: Former Smoker    Packs/day: 0.00    Years: 6.00    Pack years: 0.00    Types: Pipe, Cigars    Last attempt to quit: 12/23/1980    Years since quitting: 38.1  . Smokeless tobacco: Never Used  Substance and Sexual Activity  . Alcohol use: No  . Drug use: No  . Sexual activity: Never  Lifestyle  . Physical activity:    Days per week: 0 days    Minutes per session: 0 min  . Stress: Not at all  Relationships  . Social connections:    Talks on phone: Patient refused    Gets together: Patient refused    Attends religious service: Patient refused    Active member of club or organization: Patient refused    Attends meetings of clubs or organizations: Patient refused    Relationship status: Patient refused  Other Topics Concern  . Not on file  Social History Narrative   Retired.     Outpatient Encounter Medications as of 01/29/2019  Medication Sig  . aspirin EC 81 MG tablet Take 1 tablet (81 mg total) by mouth daily.  Marland Kitchen atorvastatin (LIPITOR) 40 MG tablet TAKE 1 TABLET BY MOUTH ONCE DAILY  . blood glucose meter kit and supplies KIT Dispense Accuchek Aviva glucometer with 120 test strips.  Test glucose level 3 times per day with meals and at bedtime.  Dispense based on patient and insurance preference. Use up to four times daily as directed. (FOR ICD-9 250.00, 250.01, ICD 10 E11.9).  . calcipotriene (DOVONOX) 0.005 % cream Apply topically as needed (to creases).   . Cholecalciferol (VITAMIN D3) 2000 units TABS Take 1 capsule by mouth daily.  . clobetasol cream (TEMOVATE) 1.61 % Apply 1 application topically 2 (two) times daily. As needed to hands and elbows  . Continuous Blood Gluc Sensor (FREESTYLE LIBRE 14 DAY  SENSOR) MISC USE 1 SENSOR EVERY 14 DAYS  . glucose monitoring kit (FREESTYLE) monitoring kit 1 each by Does not apply route as needed for other.  . hydrochlorothiazide (HYDRODIURIL) 25 MG tablet Take 1 tablet (25 mg total) by mouth daily.  . hydrocortisone 2.5 % cream Apply 1 application topically 2 (two) times daily. As needed to face, crease,  groin and butt  . insulin aspart (NOVOLOG) 100 UNIT/ML injection Inject 0-9 Units into the skin 3 (three) times daily with meals. (Patient taking differently: Inject 0-9 Units into the skin 3 (three) times daily with meals. 10 am, 10 lunch, 15 pm)  . insulin glargine (LANTUS) 100 UNIT/ML injection Inject 0.68 mLs (68 Units total) into the skin at bedtime. (Patient taking differently: Inject 40 Units into the skin at bedtime. )  . isosorbide mononitrate (IMDUR) 30 MG 24 hr tablet Take 1 tablet (30 mg total) by mouth daily.  Marland Kitchen ketoconazole (NIZORAL) 2 % cream Apply 1 application topically 2 (two) times daily as needed for irritation. To groin, fave and buttocks.  Marland Kitchen levothyroxine (SYNTHROID, LEVOTHROID) 50 MCG tablet TAKE 1 TABLET BY MOUTH ONCE DAILY BEFORE BREAKFAST (Patient taking differently: Take 25 mcg by mouth daily before breakfast. )  . Magnesium 250 MG TABS Take by mouth daily as needed.  . mometasone (ELOCON) 0.1 % ointment Apply topically daily. As needed to scalp and ears  . omeprazole (PRILOSEC) 20 MG capsule TAKE 1 CAPSULE BY MOUTH ONCE DAILY  . potassium chloride (K-DUR) 10 MEQ tablet Take 1-2 tablets (10-20 mEq total) by mouth as directed. Take 1 tablet (10 meq) daily and extra tablet if you take torsemide.  . torsemide (DEMADEX) 20 MG tablet Take 1 tablet (20 mg total) by mouth as needed (As needed for swelling and shortness of breath). Also take extra potassium 10 meq with this.  . vitamin C (ASCORBIC ACID) 500 MG tablet Take 500 mg by mouth daily.  Marland Kitchen losartan (COZAAR) 100 MG tablet Take 1 tablet (100 mg total) by mouth daily. (Patient not  taking: Reported on 01/29/2019)  . nitroGLYCERIN (NITROSTAT) 0.4 MG SL tablet Place 1 tablet (0.4 mg total) under the tongue every 5 (five) minutes as needed for chest pain. (Patient not taking: Reported on 11/02/2018)  . traMADol (ULTRAM) 50 MG tablet Take 1 tablet (50 mg total) by mouth every 6 (six) hours as needed. (Patient not taking: Reported on 01/29/2019)   No facility-administered encounter medications on file as of 01/29/2019.     Activities of Daily Living In your present state of health, do you have any difficulty performing the following activities: 01/29/2019  Hearing? Y  Comment Does not wear hearing aids.   Vision? N  Difficulty concentrating or making decisions? Y  Walking or climbing stairs? N  Dressing or bathing? N  Doing errands, shopping? N  Preparing Food and eating ? N  Using the Toilet? N  In the past six months, have you accidently leaked urine? N  Do you have problems with loss of bowel control? N  Managing your Medications? N  Managing your Finances? N  Housekeeping or managing your Housekeeping? N  Some recent data might be hidden    Patient Care Team: [name]., MD as PCP - General (Unknown Physician Specialty) Minna Merritts, MD as Consulting Physician (Cardiology) Samara Deist, DPM as Referring Physician (Podiatry) Gabriel Carina, Betsey Holiday, MD as Physician Assistant (Endocrinology) Marsh Dolly, MD as Referring Physician (Internal Medicine)   Assessment:   This is a routine wellness examination for Joseph [name].  Exercise Activities and Dietary recommendations Current Exercise Habits: The patient does not participate in regular exercise at present, Exercise limited by: orthopedic condition(s);respiratory conditions(s)  Goals    . Exercise 3x per week (30 min per time)     Recommend increasing exercise to 3 days a week for at least 30 minutes.     Marland Kitchen  Increase water intake     Starting 12/23/16, I will starting drinking 4 glasses of water a day.        Fall Risk Fall Risk  01/29/2019 01/08/2018 08/08/2017 12/23/2016 09/28/2016  Falls in the past year? 0 No No No No   FALL RISK PREVENTION PERTAINING TO THE HOME: Any stairs in or around the home? No  If so, do they handrails? N/A  Home free of loose throw rugs in walkways, pet beds, electrical cords, etc? Yes  Adequate lighting in your home to reduce risk of falls? Yes   ASSISTIVE DEVICES UTILIZED TO PREVENT FALLS:  Life alert? No  Use of a cane, walker or w/c? No  Grab bars in the bathroom? Yes  Shower chair or bench in shower? Yes  Elevated toilet seat or a handicapped toilet? Yes    TIMED UP AND GO:  Was the test performed? No .    Depression Screen PHQ 2/9 Scores 01/29/2019 01/29/2019 01/08/2018 08/08/2017  PHQ - 2 Score 0 0 0 0  PHQ- 9 Score 0 - - 0    Cognitive Function     6CIT Screen 01/29/2019 01/08/2018 12/23/2016  What Year? 0 points 0 points 0 points  What month? 0 points 0 points 0 points  What time? 0 points 0 points 0 points  Count back from 20 0 points 0 points 0 points  Months in reverse 0 points 2 points 0 points  Repeat phrase 0 points 0 points 2 points  Total Score 0 2 2    Immunization History  Administered Date(s) Administered  . Influenza Split 08/23/2011  . Influenza, High Dose Seasonal PF 09/01/2015, 08/08/2017  . Influenza,inj,Quad PF,6+ Mos 08/05/2013, 08/04/2014, 08/18/2016  . Pneumococcal Conjugate-13 03/24/2015  . Pneumococcal Polysaccharide-23 10/18/2014  . Tdap 08/04/2014  . Zoster 08/04/2014    Qualifies for Shingles Vaccine? Yes  Zostavax completed 08/04/14. Due for Shingrix. Education has been provided regarding the importance of this vaccine. Pt has been advised to call insurance company to determine out of pocket expense. Advised may also receive vaccine at local pharmacy or Health Dept. Verbalized acceptance and understanding.  Tdap: Up to date  Flu Vaccine: Up to date  Pneumococcal Vaccine: Up to date  Screening  Tests Health Maintenance  Topic Date Due  . OPHTHALMOLOGY EXAM  09/21/2017  . HEMOGLOBIN A1C  06/19/2019  . FOOT EXAM  12/27/2019  . TETANUS/TDAP  08/04/2024  . INFLUENZA VACCINE  Completed  . PNA vac Low Risk Adult  Completed   Cancer Screenings:  Colorectal Screening: No longer required.   Lung Cancer Screening: (Low Dose CT Chest recommended if Age 101-80 years, 30 pack-year currently smoking OR have quit w/in 15years.) does not qualify.    Additional Screening:  Vision Screening: Recommended annual ophthalmology exams for early detection of glaucoma and other disorders of the eye.  Dental Screening: Recommended annual dental exams for proper oral hygiene  Community Resource Referral:  CRR required this visit?  No ,      Plan:  I have personally reviewed and addressed the Medicare Annual Wellness questionnaire and have noted the following in the patient's chart:  A. Medical and social history B. Use of alcohol, tobacco or illicit drugs  C. Current medications and supplements D. Functional ability and status E.  Nutritional status F.  Physical activity G. Advance directives H. List of other physicians I.  Hospitalizations, surgeries, and ER visits in previous 12 months J.  Vitals K. Screenings such as hearing  and vision if needed, cognitive and depression L. Referrals and appointments - none  In addition, I have reviewed and discussed with patient certain preventive protocols, quality metrics, and best practice recommendations. A written personalized care plan for preventive services as well as general preventive health recommendations were provided to patient.  See attached scanned questionnaire for additional information.   Signed,  Fabio Neighbors, LPN Nurse Health Advisor   Nurse Recommendations: Awaiting eye exam records to update HM.

## 2019-02-22 ENCOUNTER — Other Ambulatory Visit: Payer: Self-pay | Admitting: Family Medicine

## 2019-02-22 DIAGNOSIS — E1151 Type 2 diabetes mellitus with diabetic peripheral angiopathy without gangrene: Secondary | ICD-10-CM

## 2019-03-21 ENCOUNTER — Other Ambulatory Visit: Payer: Self-pay | Admitting: Family Medicine

## 2019-03-21 DIAGNOSIS — E1151 Type 2 diabetes mellitus with diabetic peripheral angiopathy without gangrene: Secondary | ICD-10-CM

## 2019-04-08 ENCOUNTER — Other Ambulatory Visit: Payer: Self-pay | Admitting: Family Medicine

## 2019-04-08 NOTE — Telephone Encounter (Signed)
Please review

## 2019-04-10 ENCOUNTER — Encounter: Payer: PPO | Admitting: Family Medicine

## 2019-04-10 ENCOUNTER — Ambulatory Visit: Payer: Self-pay | Admitting: Family Medicine

## 2019-04-23 ENCOUNTER — Other Ambulatory Visit: Payer: Self-pay | Admitting: Family Medicine

## 2019-04-23 DIAGNOSIS — E1151 Type 2 diabetes mellitus with diabetic peripheral angiopathy without gangrene: Secondary | ICD-10-CM

## 2019-04-23 NOTE — Telephone Encounter (Signed)
Please review

## 2019-04-29 ENCOUNTER — Other Ambulatory Visit: Payer: Self-pay | Admitting: Cardiovascular Disease

## 2019-05-02 ENCOUNTER — Other Ambulatory Visit: Payer: Self-pay | Admitting: Cardiovascular Disease

## 2019-05-27 ENCOUNTER — Other Ambulatory Visit: Payer: Self-pay | Admitting: Family Medicine

## 2019-05-27 DIAGNOSIS — E1151 Type 2 diabetes mellitus with diabetic peripheral angiopathy without gangrene: Secondary | ICD-10-CM

## 2019-06-15 ENCOUNTER — Other Ambulatory Visit: Payer: Self-pay | Admitting: Family Medicine

## 2019-06-15 DIAGNOSIS — E1151 Type 2 diabetes mellitus with diabetic peripheral angiopathy without gangrene: Secondary | ICD-10-CM

## 2019-06-24 ENCOUNTER — Encounter: Payer: Self-pay | Admitting: Family Medicine

## 2019-07-10 DIAGNOSIS — Z794 Long term (current) use of insulin: Secondary | ICD-10-CM | POA: Diagnosis not present

## 2019-07-10 DIAGNOSIS — E1159 Type 2 diabetes mellitus with other circulatory complications: Secondary | ICD-10-CM | POA: Diagnosis not present

## 2019-07-10 DIAGNOSIS — R809 Proteinuria, unspecified: Secondary | ICD-10-CM | POA: Diagnosis not present

## 2019-07-10 DIAGNOSIS — E1165 Type 2 diabetes mellitus with hyperglycemia: Secondary | ICD-10-CM | POA: Diagnosis not present

## 2019-07-10 DIAGNOSIS — E1129 Type 2 diabetes mellitus with other diabetic kidney complication: Secondary | ICD-10-CM | POA: Diagnosis not present

## 2019-09-16 ENCOUNTER — Other Ambulatory Visit: Payer: Self-pay | Admitting: Family Medicine

## 2019-09-25 ENCOUNTER — Ambulatory Visit: Payer: PPO | Admitting: Family Medicine

## 2019-10-09 NOTE — Progress Notes (Signed)
     Patient: Joseph [name] Male    DOB: 02/20/1925   85 y.o.   MRN: [id number] Visit Date: 10/10/2019  Today's Provider: Richard Gilbert Jr, MD   Chief Complaint  Patient presents with  . Hip Pain   Subjective:     Hip Pain  The incident occurred 5 to 7 days ago. The incident occurred at home. The injury mechanism was a fall. The pain is present in the left hip and right hip. The quality of the pain is described as aching. The pain is at a severity of 7/10. The pain has been constant since onset. The symptoms are aggravated by movement and weight bearing. He has tried acetaminophen for the symptoms. The treatment provided moderate relief.  He fell recently, no syncope, he just missed the seat.  Seem to fall on his right side and buttock.  His pain is in the right buttock region the right flank.  The flank pain is better today.  Angina has been stable.  Is taking his medication as prescribed.  No other falls other than the above fall.  Atherosclerosis of coronary artery bypass graft of native heart with stable angina pectoris (HCC) From 10/09/2018-All risk factors treated  HYPERTENSION, BENIGN From 10/09/2018-Stable  Adult hypothyroidism From 10/09/2018-Stable  Mixed hyperlipidemia From 10/09/2018-Treated  Morbid obesity due to excess calories (HCC) From 10/09/2018-I think weight loss would help a little bit but it will be difficult for this patient at 85 years old.  Diabetes From 10/09/2018-per endocrine.   No Known Allergies   Current Outpatient Medications:  .  aspirin EC 81 MG tablet, Take 1 tablet (81 mg total) by mouth daily., Disp: 90 tablet, Rfl: 3 .  atorvastatin (LIPITOR) 40 MG tablet, Take 1 tablet by mouth once daily, Disp: 90 tablet, Rfl: 0 .  blood glucose meter kit and supplies KIT, Dispense Accuchek Aviva glucometer with 120 test strips.  Test glucose level 3 times per day with meals and at bedtime.  Dispense based on patient and insurance  preference. Use up to four times daily as directed. (FOR ICD-9 250.00, 250.01, ICD 10 E11.9)., Disp: 1 each, Rfl: 0 .  calcipotriene (DOVONOX) 0.005 % cream, Apply topically as needed (to creases). , Disp: , Rfl:  .  Cholecalciferol (VITAMIN D3) 2000 units TABS, Take 1 capsule by mouth daily., Disp: , Rfl:  .  clobetasol cream (TEMOVATE) 0.05 %, Apply 1 application topically 2 (two) times daily. As needed to hands and elbows, Disp: , Rfl:  .  Continuous Blood Gluc Sensor (FREESTYLE LIBRE 14 DAY SENSOR) MISC, USE AS DIRECTED EVERY 14 DAYS, Disp: 2 each, Rfl: 5 .  glucose monitoring kit (FREESTYLE) monitoring kit, 1 each by Does not apply route as needed for other., Disp: 1 each, Rfl: 0 .  hydrochlorothiazide (HYDRODIURIL) 25 MG tablet, Take 1 tablet (25 mg total) by mouth daily., Disp: 90 tablet, Rfl: 3 .  hydrocortisone 2.5 % cream, Apply 1 application topically 2 (two) times daily. As needed to face, crease, groin and butt, Disp: , Rfl:  .  insulin aspart (NOVOLOG) 100 UNIT/ML injection, Inject 0-9 Units into the skin 3 (three) times daily with meals. (Patient taking differently: Inject 0-9 Units into the skin 3 (three) times daily with meals. 10 am, 10 lunch, 15 pm), Disp: 10 mL, Rfl: 0 .  insulin glargine (LANTUS) 100 UNIT/ML injection, Inject 0.68 mLs (68 Units total) into the skin at bedtime. (Patient taking differently: Inject 40 Units into the skin   at bedtime. ), Disp: 10 mL, Rfl: 0 .  isosorbide mononitrate (IMDUR) 30 MG 24 hr tablet, Take 1 tablet (30 mg total) by mouth daily., Disp: 90 tablet, Rfl: 4 .  ketoconazole (NIZORAL) 2 % cream, Apply 1 application topically 2 (two) times daily as needed for irritation. To groin, fave and buttocks., Disp: , Rfl:  .  levothyroxine (SYNTHROID, LEVOTHROID) 50 MCG tablet, TAKE 1 TABLET BY MOUTH ONCE DAILY BEFORE BREAKFAST (Patient taking differently: Take 25 mcg by mouth daily before breakfast. ), Disp: 90 tablet, Rfl: 3 .  losartan (COZAAR) 100 MG tablet,  Take 1 tablet (100 mg total) by mouth daily. (Patient not taking: Reported on 01/29/2019), Disp: 90 tablet, Rfl: 3 .  Magnesium 250 MG TABS, Take by mouth daily as needed., Disp: , Rfl:  .  mometasone (ELOCON) 0.1 % ointment, Apply topically daily. As needed to scalp and ears, Disp: , Rfl:  .  nitroGLYCERIN (NITROSTAT) 0.4 MG SL tablet, Place 1 tablet (0.4 mg total) under the tongue every 5 (five) minutes as needed for chest pain. (Patient not taking: Reported on 11/02/2018), Disp: 25 tablet, Rfl: 3 .  omeprazole (PRILOSEC) 20 MG capsule, Take 1 capsule by mouth once daily, Disp: 90 capsule, Rfl: 3 .  potassium chloride (K-DUR) 10 MEQ tablet, Take 1-2 tablets (10-20 mEq total) by mouth as directed. Take 1 tablet (10 meq) daily and extra tablet if you take torsemide., Disp: 180 tablet, Rfl: 3 .  torsemide (DEMADEX) 20 MG tablet, Take 1 tablet (20 mg total) by mouth as needed (As needed for swelling and shortness of breath). Also take extra potassium 10 meq with this., Disp: 180 tablet, Rfl: 3 .  traMADol (ULTRAM) 50 MG tablet, Take 1 tablet (50 mg total) by mouth every 6 (six) hours as needed. (Patient not taking: Reported on 01/29/2019), Disp: 30 tablet, Rfl: 0 .  vitamin C (ASCORBIC ACID) 500 MG tablet, Take 500 mg by mouth daily., Disp: , Rfl:   Review of Systems  Constitutional: Negative for appetite change, chills and fever.  HENT: Negative.   Eyes: Negative.   Respiratory: Negative for chest tightness, shortness of breath and wheezing.   Cardiovascular: Negative for chest pain and palpitations.  Gastrointestinal: Negative for abdominal pain, nausea and vomiting.  Endocrine: Negative.   Musculoskeletal: Positive for arthralgias and back pain.  Allergic/Immunologic: Negative.   Neurological: Negative.   Hematological: Negative.   Psychiatric/Behavioral: Negative.     Social History   Tobacco Use  . Smoking status: Former Smoker    Packs/day: 0.00    Years: 6.00    Pack years: 0.00     Types: Pipe, Cigars    Quit date: 12/23/1980    Years since quitting: 38.8  . Smokeless tobacco: Never Used  Substance Use Topics  . Alcohol use: No      Objective:   There were no vitals taken for this visit. There were no vitals filed for this visit.There is no height or weight on file to calculate BMI.   Physical Exam Vitals signs reviewed.  Constitutional:      Appearance: He is well-developed.  HENT:     Head: Normocephalic and atraumatic.     Right Ear: External ear normal.     Left Ear: External ear normal.     Nose: Nose normal.  Eyes:     Conjunctiva/sclera: Conjunctivae normal.     Pupils: Pupils are equal, round, and reactive to light.  Neck:     Musculoskeletal: Normal   range of motion and neck supple.  Cardiovascular:     Rate and Rhythm: Normal rate and regular rhythm.     Heart sounds: Murmur present.     Comments: 2/6 holosystolic murmur. Pulmonary:     Effort: Pulmonary effort is normal.     Breath sounds: Normal breath sounds.  Abdominal:     General: Bowel sounds are normal.     Palpations: Abdomen is soft.  Genitourinary:    Penis: Normal.      Prostate: Normal.     Rectum: Normal.  Musculoskeletal: Normal range of motion.     Comments: Mild tenderness over right greater trochanter.  Skin:    General: Skin is warm and dry.  Neurological:     General: No focal deficit present.     Mental Status: He is alert and oriented to person, place, and time.  Psychiatric:        Behavior: Behavior normal.        Thought Content: Thought content normal.        Judgment: Judgment normal.   Little change from previous tracing on EKG.  Probable old anteroseptal infarct.  ST-T wave changes stable.  Rate is low at around 48.   No results found for any visits on 10/10/19.     Assessment & Plan    1. Acute bilateral low back pain without sciatica Original pain appeared to be flank pain I think this was mainly musculoskeletal after his fall.  This is  improved. - POCT Urinalysis Dipstick  2. Diabetes mellitus with peripheral vascular disease (HCC) A1c is 8.5 today which is stable microalbumin is 20 and patient is on losartan. - POCT UA - Microalbumin - POCT HgB A1C  3. Atherosclerosis of coronary artery bypass graft of native heart with stable angina pectoris [facility]) Patient had some recent chest tightness.  No indication this is ischemia - EKG 12-Lead  4. Bradycardia If this gets any lower patient becomes symptomatic will refer him back to cardiology sooner.  At this time I think he is hemodynamically stable.  5. Trochanteric bursitis of right hip This is from his fall.  Encouraged heating pad now.  I think this will resolve.  No imaging necessary.  Consider PT referral if he has any further falls but I think he slipped out of his chair for this     Wilhemena Durie, MD  [place]

## 2019-10-10 ENCOUNTER — Ambulatory Visit (INDEPENDENT_AMBULATORY_CARE_PROVIDER_SITE_OTHER): Payer: PPO | Admitting: Family Medicine

## 2019-10-10 ENCOUNTER — Other Ambulatory Visit: Payer: Self-pay

## 2019-10-10 DIAGNOSIS — M7061 Trochanteric bursitis, right hip: Secondary | ICD-10-CM | POA: Diagnosis not present

## 2019-10-10 DIAGNOSIS — Z23 Encounter for immunization: Secondary | ICD-10-CM | POA: Diagnosis not present

## 2019-10-10 DIAGNOSIS — I25708 Atherosclerosis of coronary artery bypass graft(s), unspecified, with other forms of angina pectoris: Secondary | ICD-10-CM | POA: Diagnosis not present

## 2019-10-10 DIAGNOSIS — M545 Low back pain, unspecified: Secondary | ICD-10-CM

## 2019-10-10 DIAGNOSIS — R001 Bradycardia, unspecified: Secondary | ICD-10-CM | POA: Diagnosis not present

## 2019-10-10 DIAGNOSIS — E1151 Type 2 diabetes mellitus with diabetic peripheral angiopathy without gangrene: Secondary | ICD-10-CM | POA: Diagnosis not present

## 2019-10-10 LAB — POCT URINALYSIS DIPSTICK
Bilirubin, UA: NEGATIVE
Blood, UA: NEGATIVE
Glucose, UA: NEGATIVE
Ketones, UA: NEGATIVE
Leukocytes, UA: NEGATIVE
Nitrite, UA: NEGATIVE
Protein, UA: POSITIVE — AB
Spec Grav, UA: 1.025 (ref 1.010–1.025)
Urobilinogen, UA: 0.2 E.U./dL
pH, UA: 6 (ref 5.0–8.0)

## 2019-10-10 LAB — POCT GLYCOSYLATED HEMOGLOBIN (HGB A1C)
Est. average glucose Bld gHb Est-mCnc: 197
Hemoglobin A1C: 8.5 % — AB (ref 4.0–5.6)

## 2019-10-10 LAB — POCT UA - MICROALBUMIN: Microalbumin Ur, POC: 20 mg/L

## 2019-10-14 DIAGNOSIS — E1165 Type 2 diabetes mellitus with hyperglycemia: Secondary | ICD-10-CM | POA: Diagnosis not present

## 2019-10-21 DIAGNOSIS — E1159 Type 2 diabetes mellitus with other circulatory complications: Secondary | ICD-10-CM | POA: Diagnosis not present

## 2019-10-21 DIAGNOSIS — E1129 Type 2 diabetes mellitus with other diabetic kidney complication: Secondary | ICD-10-CM | POA: Diagnosis not present

## 2019-10-21 DIAGNOSIS — Z794 Long term (current) use of insulin: Secondary | ICD-10-CM | POA: Diagnosis not present

## 2019-10-21 DIAGNOSIS — E1165 Type 2 diabetes mellitus with hyperglycemia: Secondary | ICD-10-CM | POA: Diagnosis not present

## 2019-10-21 DIAGNOSIS — R809 Proteinuria, unspecified: Secondary | ICD-10-CM | POA: Diagnosis not present

## 2019-11-04 ENCOUNTER — Other Ambulatory Visit: Payer: Self-pay | Admitting: Cardiovascular Disease

## 2019-11-06 NOTE — Progress Notes (Signed)
Patient: Joseph [name] Male    DOB: 1925-01-24   85 y.o.   MRN: [id number] Visit Date: 11/07/2019  Today's Provider: Wilhemena Durie, MD   Chief Complaint  Patient presents with  . Arm Pain   Subjective:     Arm Pain  There was no injury mechanism. The pain is present in the left shoulder. The quality of the pain is described as aching and cramping. The pain does not radiate. The pain is at a severity of 10/10. The pain is moderate. The pain has been constant since the incident. Pertinent negatives include no chest pain, numbness or tingling. The symptoms are aggravated by movement and lifting. He has tried NSAIDs, heat, acetaminophen and rest for the symptoms. The treatment provided mild relief.  The patient feels the arm pain is from the flu shot. The shoulder actually hurts with abduction.  No Known Allergies   Current Outpatient Medications:  .  aspirin EC 81 MG tablet, Take 1 tablet (81 mg total) by mouth daily., Disp: 90 tablet, Rfl: 3 .  atorvastatin (LIPITOR) 40 MG tablet, Take 1 tablet by mouth once daily, Disp: 90 tablet, Rfl: 0 .  blood glucose meter kit and supplies KIT, Dispense Accuchek Aviva glucometer with 120 test strips.  Test glucose level 3 times per day with meals and at bedtime.  Dispense based on patient and insurance preference. Use up to four times daily as directed. (FOR ICD-9 250.00, 250.01, ICD 10 E11.9)., Disp: 1 each, Rfl: 0 .  calcipotriene (DOVONOX) 0.005 % cream, Apply topically as needed (to creases). , Disp: , Rfl:  .  Cholecalciferol (VITAMIN D3) 2000 units TABS, Take 1 capsule by mouth daily., Disp: , Rfl:  .  clobetasol cream (TEMOVATE) 8.18 %, Apply 1 application topically 2 (two) times daily. As needed to hands and elbows, Disp: , Rfl:  .  Continuous Blood Gluc Sensor (FREESTYLE LIBRE 14 DAY SENSOR) MISC, USE AS DIRECTED EVERY 14 DAYS, Disp: 2 each, Rfl: 5 .  glucose monitoring kit (FREESTYLE) monitoring kit, 1 each by Does not  apply route as needed for other., Disp: 1 each, Rfl: 0 .  hydrochlorothiazide (HYDRODIURIL) 25 MG tablet, Take 1 tablet (25 mg total) by mouth daily., Disp: 90 tablet, Rfl: 3 .  hydrocortisone 2.5 % cream, Apply 1 application topically 2 (two) times daily. As needed to face, crease, groin and butt, Disp: , Rfl:  .  insulin aspart (NOVOLOG) 100 UNIT/ML injection, Inject 0-9 Units into the skin 3 (three) times daily with meals. (Patient taking differently: Inject 0-9 Units into the skin 3 (three) times daily with meals. 10 am, 10 lunch, 15 pm), Disp: 10 mL, Rfl: 0 .  insulin glargine (LANTUS) 100 UNIT/ML injection, Inject 0.68 mLs (68 Units total) into the skin at bedtime. (Patient taking differently: Inject 40 Units into the skin at bedtime. ), Disp: 10 mL, Rfl: 0 .  isosorbide mononitrate (IMDUR) 30 MG 24 hr tablet, Take 1 tablet (30 mg total) by mouth daily., Disp: 90 tablet, Rfl: 4 .  ketoconazole (NIZORAL) 2 % cream, Apply 1 application topically 2 (two) times daily as needed for irritation. To groin, fave and buttocks., Disp: , Rfl:  .  levothyroxine (SYNTHROID, LEVOTHROID) 50 MCG tablet, TAKE 1 TABLET BY MOUTH ONCE DAILY BEFORE BREAKFAST (Patient taking differently: Take 25 mcg by mouth daily before breakfast. ), Disp: 90 tablet, Rfl: 3 .  losartan (COZAAR) 100 MG tablet, Take 1 tablet (100 mg total)  by mouth daily. (Patient not taking: Reported on 01/29/2019), Disp: 90 tablet, Rfl: 3 .  Magnesium 250 MG TABS, Take by mouth daily as needed., Disp: , Rfl:  .  mometasone (ELOCON) 0.1 % ointment, Apply topically daily. As needed to scalp and ears, Disp: , Rfl:  .  nitroGLYCERIN (NITROSTAT) 0.4 MG SL tablet, Place 1 tablet (0.4 mg total) under the tongue every 5 (five) minutes as needed for chest pain. (Patient not taking: Reported on 11/02/2018), Disp: 25 tablet, Rfl: 3 .  omeprazole (PRILOSEC) 20 MG capsule, Take 1 capsule by mouth once daily, Disp: 90 capsule, Rfl: 3 .  potassium chloride (K-DUR) 10  MEQ tablet, Take 1-2 tablets (10-20 mEq total) by mouth as directed. Take 1 tablet (10 meq) daily and extra tablet if you take torsemide., Disp: 180 tablet, Rfl: 3 .  torsemide (DEMADEX) 20 MG tablet, Take 1 tablet (20 mg total) by mouth as needed (As needed for swelling and shortness of breath). Also take extra potassium 10 meq with this., Disp: 180 tablet, Rfl: 3 .  traMADol (ULTRAM) 50 MG tablet, Take 1 tablet (50 mg total) by mouth every 6 (six) hours as needed. (Patient not taking: Reported on 01/29/2019), Disp: 30 tablet, Rfl: 0 .  vitamin C (ASCORBIC ACID) 500 MG tablet, Take 500 mg by mouth daily., Disp: , Rfl:   Review of Systems  Constitutional: Negative for appetite change, chills and fever.  HENT: Negative.   Eyes: Negative.   Respiratory: Negative for chest tightness, shortness of breath and wheezing.   Cardiovascular: Negative for chest pain and palpitations.  Gastrointestinal: Negative for abdominal pain, diarrhea, nausea and vomiting.  Endocrine: Negative.   Genitourinary: Negative.   Musculoskeletal: Positive for arthralgias.  Skin: Negative.   Allergic/Immunologic: Negative.   Neurological: Negative for dizziness, tingling, numbness and headaches.  Hematological: Negative.   Psychiatric/Behavioral: Negative.     Social History   Tobacco Use  . Smoking status: Former Smoker    Packs/day: 0.00    Years: 6.00    Pack years: 0.00    Types: Pipe, Cigars    Quit date: 12/23/1980    Years since quitting: 38.8  . Smokeless tobacco: Never Used  Substance Use Topics  . Alcohol use: No      Objective:   BP (!) 187/65   Pulse (!) 52   Temp (!) 97 F (36.1 C) (Temporal)   Resp 16   Ht _0  (1.676 m)   Wt 212 lb 9.6 oz (96.4 kg)   SpO2 96%   BMI 34.31 kg/m  Vitals:   11/07/19 1003  BP: (!) 187/65  Pulse: (!) 52  Resp: 16  Temp: (!) 97 F (36.1 C)  TempSrc: Temporal  SpO2: 96%  Weight: 212 lb 9.6 oz (96.4 kg)  Height: _1  (1.676 m)  Body mass index is  34.31 kg/m.   Physical Exam Vitals reviewed.  Constitutional:      Appearance: He is well-developed.  HENT:     Head: Normocephalic and atraumatic.     Right Ear: External ear normal.     Left Ear: External ear normal.     Nose: Nose normal.  Eyes:     General: No scleral icterus.    Conjunctiva/sclera: Conjunctivae normal.  Neck:     Thyroid: No thyromegaly.  Cardiovascular:     Rate and Rhythm: Normal rate and regular rhythm.     Heart sounds: Normal heart sounds.  Pulmonary:     Effort: Pulmonary  effort is normal.     Breath sounds: Normal breath sounds.  Abdominal:     Palpations: Abdomen is soft.  Musculoskeletal:     Comments: 2+ edema. Pain with abduction of left shoulder. He has tenderness as a roll over the biceps tendon on the left  Skin:    General: Skin is warm and dry.  Neurological:     Mental Status: He is alert and oriented to person, place, and time.  Psychiatric:        Behavior: Behavior normal.        Thought Content: Thought content normal.        Judgment: Judgment normal.      No results found for any visits on 11/07/19.     Assessment & Plan    1. Left arm pain Follow up with Dr. Brita Romp next week.  - DG Shoulder Left - predniSONE (DELTASONE) 20 MG tablet; Take 1 tablet (20 mg total) by mouth daily with breakfast.  Dispense: 3 tablet; Refill: 0    2. Biceps tendonitis on left No long-term NSAID  3. Type 2 diabetes mellitus with diabetic polyneuropathy, with long-term current use of insulin (HCC) Last A1c 1 month ago 8.5. Told patient his sugars may go up a little bit after prednisone   Wilhemena Durie, MD  [facility]

## 2019-11-07 ENCOUNTER — Ambulatory Visit
Admission: RE | Admit: 2019-11-07 | Discharge: 2019-11-07 | Disposition: A | Discharge status: Home / Self Care | Payer: PPO | Source: Ambulatory Visit | Attending: Family Medicine | Admitting: Family Medicine

## 2019-11-07 ENCOUNTER — Other Ambulatory Visit: Payer: Self-pay

## 2019-11-07 ENCOUNTER — Ambulatory Visit: Payer: PPO | Admitting: Family Medicine

## 2019-11-07 ENCOUNTER — Encounter: Payer: Self-pay | Admitting: Family Medicine

## 2019-11-07 ENCOUNTER — Ambulatory Visit (INDEPENDENT_AMBULATORY_CARE_PROVIDER_SITE_OTHER): Payer: PPO | Admitting: Family Medicine

## 2019-11-07 ENCOUNTER — Telehealth: Payer: Self-pay

## 2019-11-07 ENCOUNTER — Other Ambulatory Visit: Payer: Self-pay | Admitting: Cardiovascular Disease

## 2019-11-07 VITALS — BP 187/65 | HR 52 | Temp 97.0°F | Resp 16 | Ht 66.0 in | Wt 212.6 lb

## 2019-11-07 DIAGNOSIS — I25708 Atherosclerosis of coronary artery bypass graft(s), unspecified, with other forms of angina pectoris: Secondary | ICD-10-CM | POA: Diagnosis not present

## 2019-11-07 DIAGNOSIS — M7061 Trochanteric bursitis, right hip: Secondary | ICD-10-CM | POA: Diagnosis not present

## 2019-11-07 DIAGNOSIS — E1142 Type 2 diabetes mellitus with diabetic polyneuropathy: Secondary | ICD-10-CM | POA: Diagnosis not present

## 2019-11-07 DIAGNOSIS — E1151 Type 2 diabetes mellitus with diabetic peripheral angiopathy without gangrene: Secondary | ICD-10-CM | POA: Diagnosis not present

## 2019-11-07 DIAGNOSIS — M79602 Pain in left arm: Secondary | ICD-10-CM | POA: Insufficient documentation

## 2019-11-07 DIAGNOSIS — M7522 Bicipital tendinitis, left shoulder: Secondary | ICD-10-CM

## 2019-11-07 DIAGNOSIS — Z23 Encounter for immunization: Secondary | ICD-10-CM | POA: Diagnosis not present

## 2019-11-07 DIAGNOSIS — M19012 Primary osteoarthritis, left shoulder: Secondary | ICD-10-CM | POA: Diagnosis not present

## 2019-11-07 DIAGNOSIS — R001 Bradycardia, unspecified: Secondary | ICD-10-CM | POA: Diagnosis not present

## 2019-11-07 DIAGNOSIS — Z794 Long term (current) use of insulin: Secondary | ICD-10-CM

## 2019-11-07 DIAGNOSIS — M545 Low back pain: Secondary | ICD-10-CM | POA: Diagnosis not present

## 2019-11-07 MED ORDER — PREDNISONE 20 MG PO TABS: 20.0000 mg | ORAL_TABLET | Freq: Every day | ORAL | 0 refills | Status: DC

## 2019-11-07 NOTE — Addendum Note (Signed)
Addended by: Judie Petit on: 11/07/2019 10:28 AM   Modules accepted: Orders

## 2019-11-07 NOTE — Telephone Encounter (Signed)
-----   Message from [name]., MD sent at 11/07/2019  3:37 PM EST ----- Arthritic changes.

## 2019-11-07 NOTE — Telephone Encounter (Signed)
Called and spoke with patients wife about xray results. She gave verbal understanding.

## 2019-11-11 ENCOUNTER — Other Ambulatory Visit: Payer: Self-pay | Admitting: Cardiovascular Disease

## 2019-11-11 NOTE — Telephone Encounter (Signed)
Patient is out today  *STAT* If patient is at the pharmacy, call can be transferred to refill team.   1. Which medications need to be refilled? (please list name of each medication and dose if known) Losartan 100 mg po q d   2. Which pharmacy/location (including street and city if local pharmacy) is medication to be sent to? [place]    3. Do they need a 30 day or 90 day supply? [place]

## 2019-11-12 ENCOUNTER — Ambulatory Visit (INDEPENDENT_AMBULATORY_CARE_PROVIDER_SITE_OTHER): Payer: PPO | Admitting: Family Medicine

## 2019-11-12 ENCOUNTER — Encounter: Payer: Self-pay | Admitting: Family Medicine

## 2019-11-12 ENCOUNTER — Other Ambulatory Visit: Payer: Self-pay

## 2019-11-12 VITALS — BP 176/74 | HR 60 | Temp 96.8°F | Wt 212.0 lb

## 2019-11-12 DIAGNOSIS — I10 Essential (primary) hypertension: Secondary | ICD-10-CM

## 2019-11-12 DIAGNOSIS — M7918 Myalgia, other site: Secondary | ICD-10-CM

## 2019-11-12 MED ORDER — TRAMADOL HCL 50 MG PO TABS: 50.0000 mg | ORAL_TABLET | Freq: Four times a day (QID) | ORAL | 0 refills | Status: DC | PRN

## 2019-11-12 NOTE — Progress Notes (Signed)
Patient: Joseph [name] Male    DOB: 03-02-1925   85 y.o.   MRN: [id number] Visit Date: 11/12/2019  Today's Provider: Lavon Paganini, MD   Chief Complaint  Patient presents with  . Shoulder Pain   Subjective:     Shoulder Pain  The pain is present in the left shoulder. There has been no history of extremity trauma. The problem has been gradually improving. The quality of the pain is described as aching. Pertinent negatives include no fever, inability to bear weight, itching, joint locking, joint swelling, limited range of motion, numbness, stiffness or tingling. The symptoms are aggravated by lying down.    Patient reports that his left shoulder pain started after getting a flu shot.  He indicates that it is in his left deltoid.  It is getting much better since taking prednisone, but still aches at night sometimes.  Hypertension: Patient's blood pressure was elevated at his last visit and is elevated again today.  He states that he has been on losartan for a long time, but had run out at his last visit and was off of it at that point.  He just started this back today.  His blood pressure was well controlled previously on losartan.  BP Readings from Last 3 Encounters:  11/12/19 (!) 176/74  11/07/19 (!) 187/65  01/29/19 (!) 154/56    No Known Allergies   Current Outpatient Medications:  .  aspirin EC 81 MG tablet, Take 1 tablet (81 mg total) by mouth daily., Disp: 90 tablet, Rfl: 3 .  atorvastatin (LIPITOR) 40 MG tablet, Take 1 tablet by mouth once daily, Disp: 90 tablet, Rfl: 0 .  blood glucose meter kit and supplies KIT, Dispense Accuchek Aviva glucometer with 120 test strips.  Test glucose level 3 times per day with meals and at bedtime.  Dispense based on patient and insurance preference. Use up to four times daily as directed. (FOR ICD-9 250.00, 250.01, ICD 10 E11.9)., Disp: 1 each, Rfl: 0 .  calcipotriene (DOVONOX) 0.005 % cream, Apply topically as needed (to  creases). , Disp: , Rfl:  .  Cholecalciferol (VITAMIN D3) 2000 units TABS, Take 1 capsule by mouth daily., Disp: , Rfl:  .  clobetasol cream (TEMOVATE) 3.54 %, Apply 1 application topically 2 (two) times daily. As needed to hands and elbows, Disp: , Rfl:  .  Continuous Blood Gluc Sensor (FREESTYLE LIBRE 14 DAY SENSOR) MISC, USE AS DIRECTED EVERY 14 DAYS, Disp: 2 each, Rfl: 5 .  glucose monitoring kit (FREESTYLE) monitoring kit, 1 each by Does not apply route as needed for other., Disp: 1 each, Rfl: 0 .  hydrocortisone 2.5 % cream, Apply 1 application topically 2 (two) times daily. As needed to face, crease, groin and butt, Disp: , Rfl:  .  insulin aspart (NOVOLOG) 100 UNIT/ML injection, Inject 0-9 Units into the skin 3 (three) times daily with meals. (Patient taking differently: Inject 0-9 Units into the skin 3 (three) times daily with meals. 10 am, 10 lunch, 15 pm), Disp: 10 mL, Rfl: 0 .  insulin glargine (LANTUS) 100 UNIT/ML injection, Inject 0.68 mLs (68 Units total) into the skin at bedtime. (Patient taking differently: Inject 40 Units into the skin at bedtime. ), Disp: 10 mL, Rfl: 0 .  isosorbide mononitrate (IMDUR) 30 MG 24 hr tablet, Take 1 tablet (30 mg total) by mouth daily., Disp: 90 tablet, Rfl: 4 .  ketoconazole (NIZORAL) 2 % cream, Apply 1 application topically 2 (two) times  daily as needed for irritation. To groin, fave and buttocks., Disp: , Rfl:  .  levothyroxine (SYNTHROID, LEVOTHROID) 50 MCG tablet, TAKE 1 TABLET BY MOUTH ONCE DAILY BEFORE BREAKFAST (Patient taking differently: Take 25 mcg by mouth daily before breakfast. ), Disp: 90 tablet, Rfl: 3 .  losartan (COZAAR) 100 MG tablet, Take 1 tablet (100 mg total) by mouth daily. NEEDS OFFICE VISIT FOR FURTHER REFILLS., Disp: 30 tablet, Rfl: 0 .  Magnesium 250 MG TABS, Take by mouth daily as needed., Disp: , Rfl:  .  mometasone (ELOCON) 0.1 % ointment, Apply topically daily. As needed to scalp and ears, Disp: , Rfl:  .  nitroGLYCERIN  (NITROSTAT) 0.4 MG SL tablet, Place 1 tablet (0.4 mg total) under the tongue every 5 (five) minutes as needed for chest pain., Disp: 25 tablet, Rfl: 3 .  omeprazole (PRILOSEC) 20 MG capsule, Take 1 capsule by mouth once daily, Disp: 90 capsule, Rfl: 3 .  potassium chloride (K-DUR) 10 MEQ tablet, Take 1-2 tablets (10-20 mEq total) by mouth as directed. Take 1 tablet (10 meq) daily and extra tablet if you take torsemide., Disp: 180 tablet, Rfl: 3 .  predniSONE (DELTASONE) 20 MG tablet, Take 1 tablet (20 mg total) by mouth daily with breakfast., Disp: 3 tablet, Rfl: 0 .  vitamin C (ASCORBIC ACID) 500 MG tablet, Take 500 mg by mouth daily., Disp: , Rfl:  .  hydrochlorothiazide (HYDRODIURIL) 25 MG tablet, Take 1 tablet (25 mg total) by mouth daily., Disp: 90 tablet, Rfl: 3 .  torsemide (DEMADEX) 20 MG tablet, Take 1 tablet (20 mg total) by mouth as needed (As needed for swelling and shortness of breath). Also take extra potassium 10 meq with this., Disp: 180 tablet, Rfl: 3 .  traMADol (ULTRAM) 50 MG tablet, Take 1 tablet (50 mg total) by mouth every 6 (six) hours as needed. (Patient not taking: Reported on 01/29/2019), Disp: 30 tablet, Rfl: 0  Review of Systems  Constitutional: Negative for fever.  Musculoskeletal: Negative for stiffness.  Skin: Negative for itching.  Neurological: Negative for tingling and numbness.    Social History   Tobacco Use  . Smoking status: Former Smoker    Packs/day: 0.00    Years: 6.00    Pack years: 0.00    Types: Pipe, Cigars    Quit date: 12/23/1980    Years since quitting: 38.9  . Smokeless tobacco: Never Used  Substance Use Topics  . Alcohol use: No      Objective:   BP (!) 176/74 (BP Location: Right Arm, Patient Position: Sitting, Cuff Size: Large)   Pulse 60   Temp (!) 96.8 F (36 C) (Temporal)   Wt 212 lb (96.2 kg)   BMI 34.22 kg/m  Vitals:   11/12/19 1102  BP: (!) 176/74  Pulse: 60  Temp: (!) 96.8 F (36 C)  TempSrc: Temporal  Weight: 212  lb (96.2 kg)  Body mass index is 34.22 kg/m.    Physical Exam Vitals reviewed.  Constitutional:      General: He is not in acute distress.    Appearance: Normal appearance.  HENT:     Head: Normocephalic and atraumatic.  Eyes:     General: No scleral icterus.    Conjunctiva/sclera: Conjunctivae normal.  Cardiovascular:     Rate and Rhythm: Normal rate and regular rhythm.  Pulmonary:     Effort: Pulmonary effort is normal. No respiratory distress.     Breath sounds: Normal breath sounds. No wheezing or rhonchi.  Musculoskeletal:        General: No swelling or deformity.     Cervical back: Neck supple.     Comments: L shoulder: Normal ROM. Normal strength. Negative Hawkins test. Mild TTP over L deltoid. No pain over bony landmarks.  Lymphadenopathy:     Cervical: No cervical adenopathy.  Skin:    General: Skin is warm and dry.     Findings: No rash.  Neurological:     Mental Status: He is alert and oriented to person, place, and time.  Psychiatric:        Mood and Affect: Mood normal.        Behavior: Behavior normal.      No results found for any visits on 11/12/19.     Assessment & Plan    1. Pain of left deltoid -Ongoing issue since receiving his flu shot -Pain seems to be truly in his deltoid muscle and not in the shoulder joint -Reviewed x-rays with the patient that do show significant osteoarthritis of the shoulder, which is unsurprising at his age -Encouraged him to continue to rest, ice the area -No indication for injections at this time -Small amount of low-dose tramadol given for if he has significant pain and difficulty sleeping because of it -Return precautions discussed  2. HYPERTENSION, BENIGN -Chronic and uncontrolled -Asymptomatic -Has been out of one of his medications and just now resumed it -No changes to medications at this time, but at follow-up with PCP, if remains uncontrolled, may need addition of a new medication at that time -Discussed  return precautions and red flag symptoms    Meds ordered this encounter  Medications  . traMADol (ULTRAM) 50 MG tablet    Sig: Take 1 tablet (50 mg total) by mouth every 6 (six) hours as needed.    Dispense:  20 tablet    Refill:  0     Return in about 4 weeks (around 12/10/2019) for BP f/u.   The entirety of the information documented in the History of Present Illness, Review of Systems and Physical Exam were personally obtained by me. Portions of this information were initially documented by Ashley Royalty, CMA and reviewed by me for thoroughness and accuracy.    Brigida Scotti, Dionne Bucy, MD MPH [facility]

## 2019-11-21 ENCOUNTER — Telehealth: Payer: Self-pay | Admitting: Family Medicine

## 2019-11-21 DIAGNOSIS — M79602 Pain in left arm: Secondary | ICD-10-CM

## 2019-11-21 MED ORDER — HYDROCODONE-ACETAMINOPHEN 5-325 MG PO TABS: 1.0000 | ORAL_TABLET | Freq: Three times a day (TID) | ORAL | 0 refills | Status: DC | PRN

## 2019-11-21 NOTE — Telephone Encounter (Signed)
Pt called to see if Dr. Rosanna Randy can give him another option for his shoulder/arm pain. Pt states his arm is no good and that the the 50mg  Tramadol is not helping/ please advise

## 2019-11-21 NOTE — Telephone Encounter (Signed)
Refer to ortho for next week or 2 but for now stop tramadol and try Norco 5/325 q 8 hours prn,#35,no rf. Warn him of side effects and constipation. thx

## 2019-11-21 NOTE — Telephone Encounter (Signed)
No answer and no vm

## 2019-11-26 ENCOUNTER — Ambulatory Visit: Payer: Self-pay | Admitting: Family Medicine

## 2019-12-02 ENCOUNTER — Other Ambulatory Visit: Payer: Self-pay | Admitting: Cardiovascular Disease

## 2019-12-09 ENCOUNTER — Other Ambulatory Visit: Payer: Self-pay | Admitting: Family Medicine

## 2019-12-09 ENCOUNTER — Other Ambulatory Visit: Payer: Self-pay | Admitting: Cardiovascular Disease

## 2019-12-12 ENCOUNTER — Other Ambulatory Visit: Payer: Self-pay | Admitting: Family Medicine

## 2019-12-12 DIAGNOSIS — E1151 Type 2 diabetes mellitus with diabetic peripheral angiopathy without gangrene: Secondary | ICD-10-CM

## 2019-12-13 ENCOUNTER — Telehealth: Payer: Self-pay | Admitting: Cardiovascular Disease

## 2019-12-13 MED ORDER — NITROGLYCERIN 0.4 MG SL SUBL: 0.4000 mg | SUBLINGUAL_TABLET | SUBLINGUAL | 3 refills | Status: DC | PRN

## 2019-12-13 NOTE — Telephone Encounter (Signed)
Pt c/o of Chest Pain: STAT if CP now or developed within 24 hours  1. Are you having CP right now? no  2. Are you experiencing any other symptoms (ex. SOB, nausea, vomiting, sweating)? Disoriented and not "all with it"  3. How long have you been experiencing CP? Just last night  4. Is your CP continuous or coming and going? continual  5. Have you taken Nitroglycerin? no ?

## 2019-12-13 NOTE — Telephone Encounter (Signed)
Patient reports chest pain last night and woke up this morning disoriented. Blood pressure was taken prior to his morning medications. He wants to see someone today. Advised that I do not have anything today and Dr. Rockey Situ is not here in the office. Reviewed that I do have appt available on Monday with Dr. Rockey Situ. He said he would call back if he had repeat chest pain. Advised that if he has repeat chest pain that is not resolved with rest or nitro then he should proceed to ED for further evaluation. He verbalized understanding of our conversation and had no further questions. Confirmed appointment scheduled for Monday.

## 2019-12-13 NOTE — Telephone Encounter (Signed)
Patient calling back in complaining of sever chest pain last night. States his BP 199/91. Patient really wants to be seen today, advised we don't have any openings but will be in touch  Please advise

## 2019-12-13 NOTE — Progress Notes (Deleted)
Patient: Joseph [name] Male    DOB: 10/15/1925   86 y.o.   MRN: [id number] Visit Date: 12/13/2019  Today's Provider: Wilhemena Durie, MD   No chief complaint on file.  Subjective:     HPI   HYPERTENSION, BENIGN From 11/07/2019-seen by Dr. Brita Romp. Chronic and uncontrolled. No changes to medications at this time, but at follow-up with PCP, if remains uncontrolled, may need addition of a new medication at that time. Discussed return precautions and red flag symptoms  No Known Allergies   Current Outpatient Medications:  .  aspirin EC 81 MG tablet, Take 1 tablet (81 mg total) by mouth daily., Disp: 90 tablet, Rfl: 3 .  atorvastatin (LIPITOR) 40 MG tablet, Take 1 tablet by mouth once daily, Disp: 90 tablet, Rfl: 0 .  blood glucose meter kit and supplies KIT, Dispense Accuchek Aviva glucometer with 120 test strips.  Test glucose level 3 times per day with meals and at bedtime.  Dispense based on patient and insurance preference. Use up to four times daily as directed. (FOR ICD-9 250.00, 250.01, ICD 10 E11.9)., Disp: 1 each, Rfl: 0 .  calcipotriene (DOVONOX) 0.005 % cream, Apply topically as needed (to creases). , Disp: , Rfl:  .  Cholecalciferol (VITAMIN D3) 2000 units TABS, Take 1 capsule by mouth daily., Disp: , Rfl:  .  clobetasol cream (TEMOVATE) 2.95 %, Apply 1 application topically 2 (two) times daily. As needed to hands and elbows, Disp: , Rfl:  .  Continuous Blood Gluc Receiver (FREESTYLE LIBRE 14 DAY READER) DEVI, USE AS DIRECTED AS NEEDED, Disp: 1 each, Rfl: 0 .  Continuous Blood Gluc Sensor (FREESTYLE LIBRE 14 DAY SENSOR) MISC, USE AS DIRECTED EVERY 14 DAYS, Disp: 2 each, Rfl: 0 .  glucose monitoring kit (FREESTYLE) monitoring kit, 1 each by Does not apply route as needed for other., Disp: 1 each, Rfl: 0 .  hydrochlorothiazide (HYDRODIURIL) 25 MG tablet, Take 1 tablet by mouth once daily, Disp: 30 tablet, Rfl: 0 .  HYDROcodone-acetaminophen (NORCO/VICODIN) 5-325  MG tablet, Take 1 tablet by mouth every 8 (eight) hours as needed for moderate pain. Please stop taking Tramadol 50 mg., Disp: 35 tablet, Rfl: 0 .  hydrocortisone 2.5 % cream, Apply 1 application topically 2 (two) times daily. As needed to face, crease, groin and butt, Disp: , Rfl:  .  insulin aspart (NOVOLOG) 100 UNIT/ML injection, Inject 0-9 Units into the skin 3 (three) times daily with meals. (Patient taking differently: Inject 0-9 Units into the skin 3 (three) times daily with meals. 10 am, 10 lunch, 15 pm), Disp: 10 mL, Rfl: 0 .  insulin glargine (LANTUS) 100 UNIT/ML injection, Inject 0.68 mLs (68 Units total) into the skin at bedtime. (Patient taking differently: Inject 40 Units into the skin at bedtime. ), Disp: 10 mL, Rfl: 0 .  isosorbide mononitrate (IMDUR) 30 MG 24 hr tablet, Take 1 tablet (30 mg total) by mouth daily., Disp: 90 tablet, Rfl: 4 .  ketoconazole (NIZORAL) 2 % cream, Apply 1 application topically 2 (two) times daily as needed for irritation. To groin, fave and buttocks., Disp: , Rfl:  .  levothyroxine (SYNTHROID, LEVOTHROID) 50 MCG tablet, TAKE 1 TABLET BY MOUTH ONCE DAILY BEFORE BREAKFAST (Patient taking differently: Take 25 mcg by mouth daily before breakfast. ), Disp: 90 tablet, Rfl: 3 .  losartan (COZAAR) 100 MG tablet, TAKE 1 TABLET BY MOUTH ONCE DAILY -  NEED  OFFICE  VISIT  FOR  FURTHER  REFILLS, Disp: 15 tablet, Rfl: 0 .  Magnesium 250 MG TABS, Take by mouth daily as needed., Disp: , Rfl:  .  mometasone (ELOCON) 0.1 % ointment, Apply topically daily. As needed to scalp and ears, Disp: , Rfl:  .  nitroGLYCERIN (NITROSTAT) 0.4 MG SL tablet, Place 1 tablet (0.4 mg total) under the tongue every 5 (five) minutes as needed for chest pain., Disp: 25 tablet, Rfl: 3 .  omeprazole (PRILOSEC) 20 MG capsule, Take 1 capsule by mouth once daily, Disp: 90 capsule, Rfl: 3 .  potassium chloride (K-DUR) 10 MEQ tablet, Take 1-2 tablets (10-20 mEq total) by mouth as directed. Take 1 tablet  (10 meq) daily and extra tablet if you take torsemide., Disp: 180 tablet, Rfl: 3 .  predniSONE (DELTASONE) 20 MG tablet, Take 1 tablet (20 mg total) by mouth daily with breakfast., Disp: 3 tablet, Rfl: 0 .  torsemide (DEMADEX) 20 MG tablet, Take 1 tablet (20 mg total) by mouth as needed (As needed for swelling and shortness of breath). Also take extra potassium 10 meq with this., Disp: 180 tablet, Rfl: 3 .  traMADol (ULTRAM) 50 MG tablet, Take 1 tablet (50 mg total) by mouth every 6 (six) hours as needed., Disp: 20 tablet, Rfl: 0 .  vitamin C (ASCORBIC ACID) 500 MG tablet, Take 500 mg by mouth daily., Disp: , Rfl:   Review of Systems  Constitutional: Negative for appetite change, chills and fever.  Respiratory: Negative for chest tightness, shortness of breath and wheezing.   Cardiovascular: Negative for chest pain and palpitations.  Gastrointestinal: Negative for abdominal pain, nausea and vomiting.    Social History   Tobacco Use  . Smoking status: Former Smoker    Packs/day: 0.00    Years: 6.00    Pack years: 0.00    Types: Pipe, Cigars    Quit date: 12/23/1980    Years since quitting: 38.9  . Smokeless tobacco: Never Used  Substance Use Topics  . Alcohol use: No      Objective:   There were no vitals taken for this visit. There were no vitals filed for this visit.There is no height or weight on file to calculate BMI.   Physical Exam   No results found for any visits on 12/18/19.     Assessment & Plan        Wilhemena Durie, MD  [facility]

## 2019-12-13 NOTE — Telephone Encounter (Signed)
Requested Prescriptions  Pending Prescriptions Disp Refills  . Continuous Blood Gluc Sensor (FREESTYLE LIBRE 14 DAY SENSOR) MISC [Pharmacy Med Name: FREESTYLE LIBRE SENSOR 14D KIT] 2 each 0    Sig: USE AS DIRECTED EVERY 14 DAYS     Endocrinology: Diabetes - Testing Supplies Passed - 12/12/2019  5:47 PM      Passed - Valid encounter within last 12 months    Recent Outpatient Visits          1 month ago Pain of left deltoid   [facility], Dionne Bucy, MD   1 month ago Left arm pain   [facility] [name]., MD   2 months ago Acute bilateral low back pain without sciatica   [facility] [name]., MD   1 year ago Atherosclerosis of coronary artery bypass graft of native heart with stable angina pectoris [facility])   [facility] [name]., MD   1 year ago Frequency of urination   [facility] [name]., MD      Future Appointments            In 5 days [name]., MD [facility], [place]   In 31 month  [facility], [place]   In 1 month [name]., MD [facility], [place]

## 2019-12-15 NOTE — Progress Notes (Addendum)
Cardiology Office Note  Date:  12/16/2019   ID:  Joseph [name], DOB 1925-04-23, MRN [id number]  PCP:  Joseph Banana., MD   Chief Complaint  Patient presents with  . Chest Pain    Pt. c/o chest pain that comes and goes with occas. shortness of breath; symptoms for about 1 week. Meds reviewed by the pt.'s med list.     HPI:  Joseph [name] is a 86 year old gentleman with a hx of  CAD, CABG in 1994,  PAD ,followed by Joseph [name],  obesity,  hyperlipidemia,  arthritis of the knees,   fatigue, SOB, leg weakness,  lower extremity edema,  Carotid u/s  Less than 39% stenosis bilaterally presenting for routine followup of his coronary artery disease and  chronic diastolic CHF.   Chest pain started last Thursday, 4 days ago Presented at rest in the evening Belching relieved some discomofrt Had large dinner that night Gets constipated, Some atypical pain since then in the left chest Also some discomfort lower epigastric area in ABD,  No reproducible chest pain on exertion such as walking into the office today  No regular exercise program,  Able to walk long distance into the office today  Hemoglobin A1c 8.5  Glucose running 213 normal kidney function good electrolytes January 2020  EKG personally reviewed by myself on todays visit NSR , paroxysmal tachycardia rate 100  PVCs    Lab work reviewed with him HBA1C 8 in June 2019 Total chol 133, LDL 50  Other past medical history reviewed  headache 02/03/2017,  Seen in th ER for H/A 02/03/17 Confused,  In the emergency room had CT head, Showing atherosclerotic calcification of the cavernous carotid arteries bilaterally. Lab work within normal limits No other focal neurologic deficits Patient and family thinks he had a TIA  admission May 2017 for acute cholecystitis, developing acute respiratory distress with hypoxia and acute renal disease, peak creatinine of 2. Notes indicating HIDA scan positive for acute  cholecystitis. s/p cholecystostomy tube placed in through interventional radiology 03/24/16 Diuretic was discontinued in the setting of acute renal failure He does report greater than 20 pound weight loss  Echocardiogram 03/22/2016 reviewed with him, ejection fraction 50-55% Chest x-ray 03/29/2016 with small right pleural effusion  Ultrasound of the legs showing patent right SFA stent, left side CFA and popliteal artery with good flow, waveform deterioration around the ankle on the left  Last Cardiac catheterization was performed at [facility] 08/16/2012 Catheterization showed severe three-vessel coronary artery disease with patent grafts x4. There was 80% disease of a small to moderate sized distal left circumflex. Ejection fraction 45-50%. No significant aortic valve stenosis or mitral valve regurgitation. The findings were discussed with interventional cardiology and medical management was recommended for the distal left circumflex disease.  previous Echocardiogram did not show elevated right-sided pressures and ejection fraction was normal. Previous episode of tachycardia in June 2013 that resolved without intervention  PMH:   has a past medical history of CAD (coronary artery disease), Chronic diastolic CHF (congestive heart failure) ([name]), Diabetes mellitus, type 2 ([place]), HTN (hypertension), Hyperlipidemia, Hypothyroidism, Lower extremity edema, and Spinal stenosis.  PSH:    Past Surgical History:  Procedure Laterality Date  . APPENDECTOMY    . CARDIAC CATHETERIZATION  07/2012   [facility]/no stents  . CHOLECYSTECTOMY    . CORNEAL TRANSPLANT    . CORONARY ARTERY BYPASS GRAFT  1994  . HEMORRHOID SURGERY    . PROSTATE BIOPSY    . TOE SURGERY  ingrown toe nail   Current Outpatient Medications on File Prior to Visit  Medication Sig Dispense Refill  . aspirin EC 81 MG tablet Take 1 tablet (81 mg total) by mouth daily. 90 tablet 3  . atorvastatin (LIPITOR) 40 MG tablet Take 1 tablet by  mouth once daily 90 tablet 0  . blood glucose meter kit and supplies KIT Dispense Accuchek Aviva glucometer with 120 test strips.  Test glucose level 3 times per day with meals and at bedtime.  Dispense based on patient and insurance preference. Use up to four times daily as directed. (FOR ICD-9 250.00, 250.01, ICD 10 E11.9). 1 each 0  . calcipotriene (DOVONOX) 0.005 % cream Apply topically as needed (to creases).     . Cholecalciferol (VITAMIN D3) 2000 units TABS Take 1 capsule by mouth daily.    . clobetasol cream (TEMOVATE) 6.54 % Apply 1 application topically 2 (two) times daily. As needed to hands and elbows    . Continuous Blood Gluc Receiver (FREESTYLE LIBRE 14 DAY READER) DEVI USE AS DIRECTED AS NEEDED 1 each 0  . Continuous Blood Gluc Sensor (FREESTYLE LIBRE 14 DAY SENSOR) MISC USE AS DIRECTED EVERY 14 DAYS 2 each 0  . glucose monitoring kit (FREESTYLE) monitoring kit 1 each by Does not apply route as needed for other. 1 each 0  . hydrochlorothiazide (HYDRODIURIL) 25 MG tablet Take 1 tablet by mouth once daily 30 tablet 0  . HYDROcodone-acetaminophen (NORCO/VICODIN) 5-325 MG tablet Take 1 tablet by mouth every 8 (eight) hours as needed for moderate pain. Please stop taking Tramadol 50 mg. 35 tablet 0  . hydrocortisone 2.5 % cream Apply 1 application topically 2 (two) times daily. As needed to face, crease, groin and butt    . insulin aspart (NOVOLOG) 100 UNIT/ML injection Inject 0-9 Units into the skin 3 (three) times daily with meals. (Patient taking differently: Inject 0-9 Units into the skin 3 (three) times daily with meals. 10 am, 10 lunch, 15 pm) 10 mL 0  . insulin glargine (LANTUS) 100 UNIT/ML injection Inject 0.68 mLs (68 Units total) into the skin at bedtime. (Patient taking differently: Inject 40 Units into the skin at bedtime. ) 10 mL 0  . ketoconazole (NIZORAL) 2 % cream Apply 1 application topically 2 (two) times daily as needed for irritation. To groin, fave and buttocks.    Marland Kitchen  levothyroxine (SYNTHROID, LEVOTHROID) 50 MCG tablet TAKE 1 TABLET BY MOUTH ONCE DAILY BEFORE BREAKFAST (Patient taking differently: Take 25 mcg by mouth daily before breakfast. ) 90 tablet 3  . losartan (COZAAR) 100 MG tablet TAKE 1 TABLET BY MOUTH ONCE DAILY -  NEED  OFFICE  VISIT  FOR  FURTHER  REFILLS 15 tablet 0  . Magnesium 250 MG TABS Take by mouth daily as needed.    . mometasone (ELOCON) 0.1 % ointment Apply topically daily. As needed to scalp and ears    . nitroGLYCERIN (NITROSTAT) 0.4 MG SL tablet Place 1 tablet (0.4 mg total) under the tongue every 5 (five) minutes as needed for chest pain. 25 tablet 3  . omeprazole (PRILOSEC) 20 MG capsule Take 1 capsule by mouth once daily 90 capsule 3  . potassium chloride (K-DUR) 10 MEQ tablet Take 1-2 tablets (10-20 mEq total) by mouth as directed. Take 1 tablet (10 meq) daily and extra tablet if you take torsemide. 180 tablet 3  . traMADol (ULTRAM) 50 MG tablet Take 1 tablet (50 mg total) by mouth every 6 (six) hours as needed.  20 tablet 0  . vitamin C (ASCORBIC ACID) 500 MG tablet Take 500 mg by mouth daily.    Marland Kitchen torsemide (DEMADEX) 20 MG tablet Take 1 tablet (20 mg total) by mouth as needed (As needed for swelling and shortness of breath). Also take extra potassium 10 meq with this. 180 tablet 3   No current facility-administered medications on file prior to visit.    Allergies:   Patient has no known allergies.   Social History:  The patient  reports that he quit smoking about 39 years ago. His smoking use included pipe and cigars. He smoked 0.00 packs per day for 6.00 years. He has never used smokeless tobacco. He reports that he does not drink alcohol or use drugs.   Family History:   family history includes Heart attack in his father; Heart failure in his maternal grandfather, maternal uncle, and mother; Kidney Stones in his son; Sleep apnea in his daughter and son; Stroke in his brother.    Review of Systems: Review of Systems   Constitutional: Negative.        Gait instability  HENT: Negative.   Respiratory: Negative.   Cardiovascular: Positive for chest pain.  Gastrointestinal: Positive for abdominal pain.  Musculoskeletal: Negative.   Neurological: Negative.   Psychiatric/Behavioral: Negative.   All other systems reviewed and are negative.    PHYSICAL EXAM: VS:  BP 120/62 (BP Location: Left Arm, Patient Position: Sitting, Cuff Size: Normal)   Pulse 90   Ht 5' 7"  (1.702 m)   Wt 214 lb 8 oz (97.3 kg)   SpO2 98%   BMI 33.60 kg/m  , BMI Body mass index is 33.6 kg/m. Constitutional:  oriented to person, place, and time. No distress.  HENT:  Head: Grossly normal Eyes:  no discharge. No scleral icterus.  Neck: No JVD, no carotid bruits  Cardiovascular: Regular rate and rhythm, no murmurs appreciated Episodes of tachycardia Pulmonary/Chest: Clear to auscultation bilaterally, no wheezes or rails Abdominal: Soft.  no distension.  no tenderness.  Musculoskeletal: Normal range of motion Neurological:  normal muscle tone. Coordination normal. No atrophy Skin: Skin warm and dry Psychiatric: normal affect, pleasant    Recent Labs: No results found for requested labs within last 8760 hours.    Lipid Panel Lab Results  Component Value Date   CHOL 122 05/14/2018   HDL 34 (L) 05/14/2018   LDLCALC 55 05/14/2018   TRIG 163 (H) 05/14/2018      Wt Readings from Last 3 Encounters:  12/16/19 214 lb 8 oz (97.3 kg)  11/12/19 212 lb (96.2 kg)  11/07/19 212 lb 9.6 oz (96.4 kg)     ASSESSMENT AND PLAN:   Mixed hyperlipidemia -  Numbers at goal  HYPERTENSION, BENIGN -  Blood pressure well controlled but we will start metoprolol succinate 50 mg daily for tachycardia seen on today's visit on EKG and on clinical exam  Atrial tachycardia On EKG Start metoprolol as above  Atherosclerosis of coronary artery bypass graft of native heart with stable angina pectoris ([place]) -  Atypical chest pain likely  GERD we will check a high-sensitivity troponin today Metoprolol as above for tachycardia  Diabetes mellitus with peripheral vascular disease ([place]) -  We have encouraged continued exercise, careful diet management in an effort to lose weight. Hemoglobin A1c above goal  Diastolic CHF, acute on chronic (HCC) -  HCTZ and potassium added as he is not taking his torsemide on a regular basis High risk of recurrent diastolic CHF  SOB (shortness of breath) -  Secondary to fluid, deconditioning, obesity Previously we have recommended walking program weight loss  Morbid obesity due to excess calories (HCC) -  Recommend calorie restriction walking program  Bilateral leg edema -  Symptoms well controlled on today's visit  PAD (peripheral artery disease) ([place]) -  Stressed importance of aggressive diabetes control, low cholesterol   Total encounter time more than 25 minutes  Greater than 50% was spent in counseling and coordination of care with the patient   Addendum Received phone call from the lab, elevated troponin, 450 Given elevated number concerning for unstable angina, recommended he proceed to the emergency room --He would likely need ischemic work-up including consideration for cardiac catheterization tomorrow    Disposition:   F/U 1 month   Orders Placed This Encounter  Procedures  . EKG 12-Lead     Signed, Esmond Plants, M.D., Ph.D. 12/16/2019  [place], [place]

## 2019-12-16 ENCOUNTER — Ambulatory Visit (INDEPENDENT_AMBULATORY_CARE_PROVIDER_SITE_OTHER): Payer: PPO | Admitting: Cardiovascular Disease

## 2019-12-16 ENCOUNTER — Inpatient Hospital Stay
Admission: EM | Admit: 2019-12-16 | Discharge: 2019-12-19 | DRG: 281 | Disposition: A | Discharge status: Home Health | Payer: PPO | Attending: Family Medicine | Admitting: Family Medicine

## 2019-12-16 ENCOUNTER — Emergency Department: Payer: PPO

## 2019-12-16 ENCOUNTER — Encounter: Payer: Self-pay | Admitting: Internal Medicine

## 2019-12-16 ENCOUNTER — Telehealth: Payer: Self-pay | Admitting: Cardiovascular Disease

## 2019-12-16 ENCOUNTER — Other Ambulatory Visit: Payer: Self-pay

## 2019-12-16 ENCOUNTER — Other Ambulatory Visit
Admission: RE | Admit: 2019-12-16 | Discharge: 2019-12-16 | Disposition: A | Discharge status: Home / Self Care | Payer: PPO | Source: Ambulatory Visit | Attending: Cardiovascular Disease | Admitting: Cardiovascular Disease

## 2019-12-16 ENCOUNTER — Encounter: Payer: Self-pay | Admitting: Cardiovascular Disease

## 2019-12-16 VITALS — BP 120/62 | HR 90 | Ht 67.0 in | Wt 214.5 lb

## 2019-12-16 DIAGNOSIS — I6523 Occlusion and stenosis of bilateral carotid arteries: Secondary | ICD-10-CM | POA: Diagnosis present

## 2019-12-16 DIAGNOSIS — Z794 Long term (current) use of insulin: Secondary | ICD-10-CM | POA: Diagnosis not present

## 2019-12-16 DIAGNOSIS — I249 Acute ischemic heart disease, unspecified: Secondary | ICD-10-CM | POA: Diagnosis not present

## 2019-12-16 DIAGNOSIS — R471 Dysarthria and anarthria: Secondary | ICD-10-CM | POA: Diagnosis present

## 2019-12-16 DIAGNOSIS — I25708 Atherosclerosis of coronary artery bypass graft(s), unspecified, with other forms of angina pectoris: Secondary | ICD-10-CM

## 2019-12-16 DIAGNOSIS — I441 Atrioventricular block, second degree: Secondary | ICD-10-CM | POA: Diagnosis not present

## 2019-12-16 DIAGNOSIS — I5033 Acute on chronic diastolic (congestive) heart failure: Secondary | ICD-10-CM

## 2019-12-16 DIAGNOSIS — Z79890 Hormone replacement therapy (postmenopausal): Secondary | ICD-10-CM | POA: Diagnosis not present

## 2019-12-16 DIAGNOSIS — Z7982 Long term (current) use of aspirin: Secondary | ICD-10-CM | POA: Diagnosis not present

## 2019-12-16 DIAGNOSIS — Z8673 Personal history of transient ischemic attack (TIA), and cerebral infarction without residual deficits: Secondary | ICD-10-CM

## 2019-12-16 DIAGNOSIS — I25810 Atherosclerosis of coronary artery bypass graft(s) without angina pectoris: Secondary | ICD-10-CM | POA: Diagnosis not present

## 2019-12-16 DIAGNOSIS — I25110 Atherosclerotic heart disease of native coronary artery with unstable angina pectoris: Secondary | ICD-10-CM | POA: Diagnosis not present

## 2019-12-16 DIAGNOSIS — I2510 Atherosclerotic heart disease of native coronary artery without angina pectoris: Secondary | ICD-10-CM | POA: Diagnosis not present

## 2019-12-16 DIAGNOSIS — I10 Essential (primary) hypertension: Secondary | ICD-10-CM

## 2019-12-16 DIAGNOSIS — E039 Hypothyroidism, unspecified: Secondary | ICD-10-CM | POA: Diagnosis not present

## 2019-12-16 DIAGNOSIS — I471 Supraventricular tachycardia: Secondary | ICD-10-CM | POA: Diagnosis present

## 2019-12-16 DIAGNOSIS — E1151 Type 2 diabetes mellitus with diabetic peripheral angiopathy without gangrene: Secondary | ICD-10-CM

## 2019-12-16 DIAGNOSIS — E782 Mixed hyperlipidemia: Secondary | ICD-10-CM

## 2019-12-16 DIAGNOSIS — Z951 Presence of aortocoronary bypass graft: Secondary | ICD-10-CM

## 2019-12-16 DIAGNOSIS — K5900 Constipation, unspecified: Secondary | ICD-10-CM | POA: Diagnosis not present

## 2019-12-16 DIAGNOSIS — Z79899 Other long term (current) drug therapy: Secondary | ICD-10-CM

## 2019-12-16 DIAGNOSIS — Z9049 Acquired absence of other specified parts of digestive tract: Secondary | ICD-10-CM

## 2019-12-16 DIAGNOSIS — I110 Hypertensive heart disease with heart failure: Secondary | ICD-10-CM | POA: Diagnosis not present

## 2019-12-16 DIAGNOSIS — I455 Other specified heart block: Secondary | ICD-10-CM | POA: Diagnosis not present

## 2019-12-16 DIAGNOSIS — I447 Left bundle-branch block, unspecified: Secondary | ICD-10-CM | POA: Diagnosis not present

## 2019-12-16 DIAGNOSIS — Z6831 Body mass index (BMI) 31.0-31.9, adult: Secondary | ICD-10-CM | POA: Diagnosis not present

## 2019-12-16 DIAGNOSIS — E785 Hyperlipidemia, unspecified: Secondary | ICD-10-CM | POA: Diagnosis not present

## 2019-12-16 DIAGNOSIS — M170 Bilateral primary osteoarthritis of knee: Secondary | ICD-10-CM | POA: Diagnosis not present

## 2019-12-16 DIAGNOSIS — Z87891 Personal history of nicotine dependence: Secondary | ICD-10-CM

## 2019-12-16 DIAGNOSIS — I340 Nonrheumatic mitral (valve) insufficiency: Secondary | ICD-10-CM | POA: Diagnosis not present

## 2019-12-16 DIAGNOSIS — R7989 Other specified abnormal findings of blood chemistry: Secondary | ICD-10-CM | POA: Diagnosis not present

## 2019-12-16 DIAGNOSIS — R001 Bradycardia, unspecified: Secondary | ICD-10-CM | POA: Diagnosis present

## 2019-12-16 DIAGNOSIS — I252 Old myocardial infarction: Secondary | ICD-10-CM | POA: Diagnosis not present

## 2019-12-16 DIAGNOSIS — E669 Obesity, unspecified: Secondary | ICD-10-CM | POA: Diagnosis present

## 2019-12-16 DIAGNOSIS — R600 Localized edema: Secondary | ICD-10-CM

## 2019-12-16 DIAGNOSIS — R079 Chest pain, unspecified: Secondary | ICD-10-CM | POA: Diagnosis not present

## 2019-12-16 DIAGNOSIS — I672 Cerebral atherosclerosis: Secondary | ICD-10-CM | POA: Diagnosis not present

## 2019-12-16 DIAGNOSIS — I5032 Chronic diastolic (congestive) heart failure: Secondary | ICD-10-CM | POA: Diagnosis not present

## 2019-12-16 DIAGNOSIS — Z8249 Family history of ischemic heart disease and other diseases of the circulatory system: Secondary | ICD-10-CM

## 2019-12-16 DIAGNOSIS — I214 Non-ST elevation (NSTEMI) myocardial infarction: Secondary | ICD-10-CM | POA: Diagnosis not present

## 2019-12-16 DIAGNOSIS — Z20822 Contact with and (suspected) exposure to covid-19: Secondary | ICD-10-CM | POA: Diagnosis not present

## 2019-12-16 DIAGNOSIS — Z823 Family history of stroke: Secondary | ICD-10-CM

## 2019-12-16 DIAGNOSIS — Z79891 Long term (current) use of opiate analgesic: Secondary | ICD-10-CM

## 2019-12-16 LAB — CBC WITH DIFFERENTIAL/PLATELET
Abs Immature Granulocytes: 0.04 10*3/uL (ref 0.00–0.07)
Basophils Absolute: 0.1 10*3/uL (ref 0.0–0.1)
Basophils Relative: 1 %
Eosinophils Absolute: 1.1 10*3/uL — ABNORMAL HIGH (ref 0.0–0.5)
Eosinophils Relative: 13 %
HCT: 40.9 % (ref 39.0–52.0)
Hemoglobin: 13.9 g/dL (ref 13.0–17.0)
Immature Granulocytes: 1 %
Lymphocytes Relative: 30 %
Lymphs Abs: 2.6 10*3/uL (ref 0.7–4.0)
MCH: 30.4 pg (ref 26.0–34.0)
MCHC: 34 g/dL (ref 30.0–36.0)
MCV: 89.5 fL (ref 80.0–100.0)
Monocytes Absolute: 0.6 10*3/uL (ref 0.1–1.0)
Monocytes Relative: 7 %
Neutro Abs: 4.2 10*3/uL (ref 1.7–7.7)
Neutrophils Relative %: 48 %
Platelets: 195 10*3/uL (ref 150–400)
RBC: 4.57 MIL/uL (ref 4.22–5.81)
RDW: 13.4 % (ref 11.5–15.5)
WBC: 8.6 10*3/uL (ref 4.0–10.5)
nRBC: 0 % (ref 0.0–0.2)

## 2019-12-16 LAB — COMPREHENSIVE METABOLIC PANEL
ALT: 21 U/L (ref 0–44)
AST: 24 U/L (ref 15–41)
Albumin: 3.4 g/dL — ABNORMAL LOW (ref 3.5–5.0)
Alkaline Phosphatase: 47 U/L (ref 38–126)
Anion gap: 8 (ref 5–15)
BUN: 17 mg/dL (ref 8–23)
CO2: 27 mmol/L (ref 22–32)
Calcium: 8.9 mg/dL (ref 8.9–10.3)
Chloride: 103 mmol/L (ref 98–111)
Creatinine, Ser: 1.07 mg/dL (ref 0.61–1.24)
GFR calc Af Amer: >60 mL/min (ref >60)
GFR calc non Af Amer: 60 mL/min — ABNORMAL LOW (ref >60)
Glucose, Bld: 172 mg/dL — ABNORMAL HIGH (ref 70–99)
Potassium: 3.6 mmol/L (ref 3.5–5.1)
Sodium: 138 mmol/L (ref 135–145)
Total Bilirubin: 0.6 mg/dL (ref 0.3–1.2)
Total Protein: 6.8 g/dL (ref 6.5–8.1)

## 2019-12-16 LAB — GLUCOSE, CAPILLARY
Glucose-Capillary: 60 mg/dL — ABNORMAL LOW (ref 70–99)
Glucose-Capillary: 85 mg/dL (ref 70–99)
Glucose-Capillary: 210 mg/dL — ABNORMAL HIGH (ref 70–99)

## 2019-12-16 LAB — TROPONIN I (HIGH SENSITIVITY)
Troponin I (High Sensitivity): 450 ng/L (ref <18)
Troponin I (High Sensitivity): 477 ng/L (ref <18)
Troponin I (High Sensitivity): 588 ng/L (ref <18)
Troponin I (High Sensitivity): 542 ng/L (ref <18)
Troponin I (High Sensitivity): 539 ng/L (ref <18)

## 2019-12-16 LAB — HEMOGLOBIN A1C
Hgb A1c MFr Bld: 8.9 % — ABNORMAL HIGH (ref 4.8–5.6)
Mean Plasma Glucose: 208.73 mg/dL

## 2019-12-16 LAB — APTT: aPTT: 27 seconds (ref 24–36)

## 2019-12-16 LAB — PROTIME-INR
INR: 0.9 (ref 0.8–1.2)
Prothrombin Time: 12.2 seconds (ref 11.4–15.2)

## 2019-12-16 LAB — HEPARIN LEVEL (UNFRACTIONATED): Heparin Unfractionated: 0.77 IU/mL — ABNORMAL HIGH (ref 0.30–0.70)

## 2019-12-16 MED ORDER — LEVOTHYROXINE SODIUM 25 MCG PO TABS
25.0000 ug | ORAL_TABLET | Freq: Every day | ORAL | Status: DC
  Administered 2019-12-17 – 2019-12-19 (×3): 25 ug via ORAL
  Filled 2019-12-16 (×3): qty 1

## 2019-12-16 MED ORDER — HEPARIN (PORCINE) 25000 UT/250ML-% IV SOLN
1000.0000 [IU]/h | INTRAVENOUS | Status: DC
  Administered 2019-12-16: 1150 [IU]/h via INTRAVENOUS
  Filled 2019-12-16: qty 250

## 2019-12-16 MED ORDER — ASCORBIC ACID 500 MG PO TABS
500.0000 mg | ORAL_TABLET | Freq: Every day | ORAL | Status: DC
  Administered 2019-12-16 – 2019-12-18 (×3): 500 mg via ORAL
  Filled 2019-12-16 (×3): qty 1

## 2019-12-16 MED ORDER — ASPIRIN EC 81 MG PO TBEC
81.0000 mg | DELAYED_RELEASE_TABLET | Freq: Every day | ORAL | Status: DC
  Administered 2019-12-18 – 2019-12-19 (×2): 81 mg via ORAL
  Filled 2019-12-16 (×2): qty 1

## 2019-12-16 MED ORDER — INSULIN ASPART 100 UNIT/ML ~~LOC~~ SOLN
0.0000 [IU] | Freq: Every day | SUBCUTANEOUS | Status: DC
  Administered 2019-12-16: 22:00:00 2 [IU] via SUBCUTANEOUS
  Filled 2019-12-16: qty 1

## 2019-12-16 MED ORDER — HEPARIN BOLUS VIA INFUSION
4000.0000 [IU] | Freq: Once | INTRAVENOUS | Status: AC
  Administered 2019-12-16: 4000 [IU] via INTRAVENOUS
  Filled 2019-12-16: qty 4000

## 2019-12-16 MED ORDER — SODIUM CHLORIDE 0.9% FLUSH
3.0000 mL | Freq: Two times a day (BID) | INTRAVENOUS | Status: DC
  Administered 2019-12-16: 3 mL via INTRAVENOUS

## 2019-12-16 MED ORDER — SODIUM CHLORIDE 0.9% FLUSH: 3.0000 mL | Freq: Once | INTRAVENOUS | Status: DC

## 2019-12-16 MED ORDER — METOPROLOL SUCCINATE ER 50 MG PO TB24
50.0000 mg | ORAL_TABLET | Freq: Every day | ORAL | Status: DC
  Administered 2019-12-16: 19:00:00 50 mg via ORAL
  Filled 2019-12-16: qty 1

## 2019-12-16 MED ORDER — ATORVASTATIN CALCIUM 40 MG PO TABS: 40.0000 mg | ORAL_TABLET | Freq: Every day | ORAL | 3 refills | Status: DC

## 2019-12-16 MED ORDER — INSULIN GLARGINE 100 UNIT/ML ~~LOC~~ SOLN
20.0000 [IU] | Freq: Every day | SUBCUTANEOUS | Status: DC
  Administered 2019-12-16 – 2019-12-18 (×3): 20 [IU] via SUBCUTANEOUS
  Filled 2019-12-16 (×5): qty 0.2

## 2019-12-16 MED ORDER — METOPROLOL SUCCINATE ER 50 MG PO TB24: 50.0000 mg | ORAL_TABLET | Freq: Every day | ORAL | 3 refills | Status: DC

## 2019-12-16 MED ORDER — NITROGLYCERIN 0.4 MG SL SUBL: 0.4000 mg | SUBLINGUAL_TABLET | SUBLINGUAL | Status: DC | PRN

## 2019-12-16 MED ORDER — ASPIRIN 81 MG PO CHEW
324.0000 mg | CHEWABLE_TABLET | Freq: Once | ORAL | Status: AC
  Administered 2019-12-16: 16:00:00 324 mg via ORAL
  Filled 2019-12-16: qty 4

## 2019-12-16 MED ORDER — SODIUM CHLORIDE 0.9 % WEIGHT BASED INFUSION
3.0000 mL/kg/h | INTRAVENOUS | Status: DC
  Administered 2019-12-17: 04:00:00 3 mL/kg/h via INTRAVENOUS

## 2019-12-16 MED ORDER — VITAMIN D 25 MCG (1000 UNIT) PO TABS
2000.0000 [IU] | ORAL_TABLET | Freq: Every evening | ORAL | Status: DC
  Administered 2019-12-17 – 2019-12-18 (×2): 2000 [IU] via ORAL
  Filled 2019-12-16 (×2): qty 2

## 2019-12-16 MED ORDER — LOSARTAN POTASSIUM 50 MG PO TABS
100.0000 mg | ORAL_TABLET | Freq: Every day | ORAL | Status: DC
  Administered 2019-12-16 – 2019-12-18 (×3): 100 mg via ORAL
  Filled 2019-12-16 (×3): qty 2

## 2019-12-16 MED ORDER — SODIUM CHLORIDE 0.9% FLUSH: 3.0000 mL | INTRAVENOUS | Status: DC | PRN

## 2019-12-16 MED ORDER — ONDANSETRON HCL 4 MG/2ML IJ SOLN: 4.0000 mg | Freq: Four times a day (QID) | INTRAMUSCULAR | Status: DC | PRN

## 2019-12-16 MED ORDER — PANTOPRAZOLE SODIUM 40 MG PO TBEC
40.0000 mg | DELAYED_RELEASE_TABLET | Freq: Every day | ORAL | Status: DC
  Administered 2019-12-16 – 2019-12-19 (×3): 40 mg via ORAL
  Filled 2019-12-16 (×3): qty 1

## 2019-12-16 MED ORDER — ACETAMINOPHEN 325 MG PO TABS
650.0000 mg | ORAL_TABLET | ORAL | Status: DC | PRN
  Administered 2019-12-16 – 2019-12-18 (×3): 650 mg via ORAL
  Filled 2019-12-16 (×4): qty 2

## 2019-12-16 MED ORDER — SODIUM CHLORIDE 0.9 % IV SOLN: 250.0000 mL | INTRAVENOUS | Status: DC | PRN

## 2019-12-16 MED ORDER — INSULIN ASPART 100 UNIT/ML ~~LOC~~ SOLN
0.0000 [IU] | Freq: Three times a day (TID) | SUBCUTANEOUS | Status: DC
  Administered 2019-12-17: 18:00:00 2 [IU] via SUBCUTANEOUS
  Administered 2019-12-18: 09:00:00 1 [IU] via SUBCUTANEOUS
  Administered 2019-12-18: 12:00:00 3 [IU] via SUBCUTANEOUS
  Administered 2019-12-18: 17:00:00 2 [IU] via SUBCUTANEOUS
  Filled 2019-12-16 (×4): qty 1

## 2019-12-16 MED ORDER — TORSEMIDE 20 MG PO TABS: 20.0000 mg | ORAL_TABLET | ORAL | Status: DC | PRN

## 2019-12-16 MED ORDER — LOSARTAN POTASSIUM 100 MG PO TABS: 100.0000 mg | ORAL_TABLET | Freq: Every day | ORAL | 3 refills | Status: DC

## 2019-12-16 MED ORDER — HYDROCHLOROTHIAZIDE 25 MG PO TABS: 25.0000 mg | ORAL_TABLET | Freq: Every day | ORAL | 3 refills | Status: DC

## 2019-12-16 MED ORDER — ATORVASTATIN CALCIUM 20 MG PO TABS
40.0000 mg | ORAL_TABLET | Freq: Every evening | ORAL | Status: DC
  Administered 2019-12-16 – 2019-12-18 (×3): 40 mg via ORAL
  Filled 2019-12-16 (×3): qty 2

## 2019-12-16 MED ORDER — SODIUM CHLORIDE 0.9 % WEIGHT BASED INFUSION
1.0000 mL/kg/h | INTRAVENOUS | Status: DC
  Administered 2019-12-17 (×2): 1 mL/kg/h via INTRAVENOUS

## 2019-12-16 NOTE — Progress Notes (Signed)
Patient did not want to store home insulin in pharmacy, asked if he could send home with anyone and refused that as well. Educated not to take insulin that he would receive while in hospital. Primary RN Bol[place]ar notified. Lupita Leash

## 2019-12-16 NOTE — Addendum Note (Signed)
Addended by: Minna Merritts on: 12/16/2019 02:52 PM   Modules accepted: Level of Service

## 2019-12-16 NOTE — Consult Note (Signed)
ANTICOAGULATION CONSULT NOTE - Initial Consult  Pharmacy Consult for heparin drip Indication: ACS/STEMI  No Known Allergies  Patient Measurements: Height: 5\' 7"  (170.2 cm) Weight: 214 lb 8 oz (97.3 kg) IBW/kg (Calculated) : 66.1 Heparin Dosing Weight: 87kg  Vital Signs: Temp: 98.7 F (37.1 C) (01/25 1330) Temp Source: Oral (01/25 1330) BP: 178/86 (01/25 1400) Pulse Rate: 76 (01/25 1400)  Labs: Recent Labs    12/16/19 1047 12/16/19 1338  HGB  --  13.9  HCT  --  40.9  PLT  --  195  APTT  --  27  LABPROT  --  12.2  INR  --  0.9  CREATININE  --  1.07  TROPONINIHS 450* 477*    Estimated Creatinine Clearance: 49 mL/min (by C-G formula based on SCr of 1.07 mg/dL).   Medical History: Past Medical History:  Diagnosis Date  . CAD (coronary artery disease)    a. s/p CABG;  b. 07/2012 Cath: 3VD including 80 dLCX (med Rx), 4/4 patent grafts.  . Chronic diastolic CHF (congestive heart failure) ([place])    a. 07/2012 Echo: EF 55-60%, no rwma, Gr 1 DD, mild MR;  04/2014 Echo: EF 55-60%, no rwma, mild MR, mildly dil LA.  . Diabetes mellitus, type 2 ([place])   . HTN (hypertension)   . Hyperlipidemia   . Hypothyroidism   . Lower extremity edema    a. 03/2014 amlodipine d/c'd.  Marland Kitchen Spinal stenosis     Medications:  No PTA antigoagulation of record  Assessment: 86 yo male with recent hx of chest pain, troponins trending up 450>477.  Goal of Therapy:  Heparin level 0.3-0.7 units/ml Monitor platelets by anticoagulation protocol: Yes   Plan:  Give 4000 units bolus x 1, followed by 1150 units/hour.  Will check heparin level in 8 hours per protocol.  Daily CBC's while on heparin drip.  Lu Duffel, PharmD, BCPS Clinical Pharmacist 12/16/2019 2:48 PM

## 2019-12-16 NOTE — Telephone Encounter (Signed)
Spoke with daughter

## 2019-12-16 NOTE — Telephone Encounter (Signed)
Received call from lab with critical value for troponin of 450 from [facility]. Dr. Rockey Situ notified of results and recommendations were to go to ED for him to get procedure as soon as possible. If he refused ED then we could try and schedule him for heart cath as outpatient but that may delay his care. After talking with him and wife he was agreeable to go to ED for evaluation. Notified ED Charge nurse Marya Amsler that patient would be coming to ED.

## 2019-12-16 NOTE — ED Notes (Signed)
Per EDP, Isaacs, verbal order given to Hold Heparin until results of CT Head are available.

## 2019-12-16 NOTE — Patient Instructions (Signed)
Medication Instructions:  Please start metoprolol succinate 50 mg daily  If you need a refill on your cardiac medications before your next appointment, please call your pharmacy.    Lab work: Troponin today   If you have labs (blood work) drawn today and your tests are completely normal, you will receive your results only by: Marland Kitchen MyChart Message (if you have MyChart) OR . A paper copy in the mail If you have any lab test that is abnormal or we need to change your treatment, we will call you to review the results.   Testing/Procedures: No new testing needed   Follow-Up: At [facility], you and your health needs are our priority.  As part of our continuing mission to provide you with exceptional heart care, we have created designated Provider Care Teams.  These Care Teams include your primary Cardiologist (physician) and Advanced Practice Providers (APPs -  Physician Assistants and Nurse Practitioners) who all work together to provide you with the care you need, when you need it.  . You will need a follow up appointment in 1 month   . Providers on your designated Care Team:   . Murray Hodgkins, NP . Christell Faith, PA-C . Marrianne Mood, PA-C  Any Other Special Instructions Will Be Listed Below (If Applicable).  For educational health videos Log in to : [email or web address] Or : [email or web address], password : [email or web address]

## 2019-12-16 NOTE — ED Notes (Signed)
Pt ambulatory to toilet independently; no difficulty noted at this time.

## 2019-12-16 NOTE — Progress Notes (Signed)
Pt admitted to the unit, denies chest pain at this time. Heparin gtt at 11.26ml/hr  Skin intact. MASD to abdomen folds and sacrum. Pt oriented to room, call bell in reach. Pt has his personal insulin and syringes that he brought with him from home and refuse to give it to staff to store even after advised to . Pt educated on hospital policy and instructed not to use his personal insulin at anytime during admission. Pt states he will not use his insulin he just wants to keep it in his coat pocket but will not use it.

## 2019-12-16 NOTE — Progress Notes (Signed)
Hypoglycemic Event  CBG: 60  Treatment 4 oz of orange juice:4 oz juice/soda  Symptoms asymptomatic: None  Follow-up CBG: Time 1749 CBG Result 85  Possible Reasons for Event pt has not eaten since breakfast Other: has not eaten since breakfast  Comments/MD notified:[name]   Joseph [name]

## 2019-12-16 NOTE — Consult Note (Signed)
Cardiology Consultation:   Patient ID: Joseph [name] MRN: [id number]; DOB: 27-Nov-1924  Admit date: 12/16/2019 Date of Consult: 12/16/2019  Primary Care Provider: [name]., MD Primary Cardiologist: [facility] Reason for consult: Unstable angina, elevated troponin Physician requesting consult: Dr. Posey Pronto   Patient Profile:   Joseph [name] is a 86 year old gentleman with a hx of  CAD, CABG in 1994,  PAD ,followed by Dr. Lucky Cowboy,  obesity,  hyperlipidemia,  arthritis of the knees,   fatigue, SOB, leg weakness,  lower extremity edema,  Carotid u/s  Less than 39% stenosis bilaterally presenting today to the clinic for follow-up of his coronary disease and chronic diastolic CHF, reporting having unstable angina symptoms, lab work done today showing elevated troponin 450 Was referred to the emergency room for further evaluation  History of Present Illness:    Chest pain started last Thursday, 4 days ago Presented at rest in the evening Belching relieved some discomofrt Had large dinner that night Gets constipated, Some  pain since then in the left chest Also some discomfort lower epigastric area in ABD,  " Having some right now" No reproducible chest pain on exertion such as walking into the office today  No regular exercise program,  sedentary  Lab work reviewed with him in detail Hemoglobin A1c 8.5  Glucose running 213 normal kidney function good electrolytes January 2020  EKG personally reviewed by myself on todays visit NSR , paroxysmal tachycardia rate 100  PVCs  Lab work reviewed  HBA1C 8 in June 2019 Total chol 133, LDL 50  Other past medical history reviewed  headache 02/03/2017,  Seen in th ER for H/A 02/03/17 Confused,  In the emergency room had CT head, Showing atherosclerotic calcification of the cavernous carotid arteries bilaterally. Lab work within normal limits No other focal neurologic deficits Patient and family thinks he had  a TIA  admission May 2017 for acute cholecystitis, developing acute respiratory distress with hypoxia and acute renal disease, peak creatinine of 2. Notes indicating HIDA scan positive for acute cholecystitis. s/p cholecystostomy tube placed in through interventional radiology 03/24/16 Diuretic was discontinued in the setting of acute renal failure He does report greater than 20 pound weight loss  Echocardiogram 03/22/2016 reviewed with him, ejection fraction 50-55% Chest x-ray 03/29/2016 with small right pleural effusion  Ultrasound of the legs showing patent right SFA stent, left side CFA and popliteal artery with good flow, waveform deterioration around the ankle on the left  Last Cardiac catheterization was performed at [facility] 08/16/2012 Catheterization showed severe three-vessel coronary artery disease with patent grafts x4. There was 80% disease of a small to moderate sized distal left circumflex. Ejection fraction 45-50%. No significant aortic valve stenosis or mitral valve regurgitation. The findings were discussed with interventional cardiology and medical management was recommended for the distal left circumflex disease.  previous Echocardiogram did not show elevated right-sided pressures and ejection fraction was normal. Previous episode of tachycardia in June 2013 that resolved without intervention    Heart Pathway Score:     Past Medical History:  Diagnosis Date  . CAD (coronary artery disease)    a. s/p CABG;  b. 07/2012 Cath: 3VD including 80 dLCX (med Rx), 4/4 patent grafts.  . Chronic diastolic CHF (congestive heart failure) ([place])    a. 07/2012 Echo: EF 55-60%, no rwma, Gr 1 DD, mild MR;  04/2014 Echo: EF 55-60%, no rwma, mild MR, mildly dil LA.  . Diabetes mellitus, type 2 ([place])   . HTN (hypertension)   .  Hyperlipidemia   . Hypothyroidism   . Lower extremity edema    a. 03/2014 amlodipine d/c'd.  Marland Kitchen Spinal stenosis     Past Surgical History:  Procedure Laterality  Date  . APPENDECTOMY    . CARDIAC CATHETERIZATION  07/2012   [facility]/no stents  . CHOLECYSTECTOMY    . CORNEAL TRANSPLANT    . CORONARY ARTERY BYPASS GRAFT  1994  . HEMORRHOID SURGERY    . PROSTATE BIOPSY    . TOE SURGERY     ingrown toe nail     Home Medications:  Prior to Admission medications   Medication Sig Start Date End Date Taking? Authorizing Provider  aspirin EC 81 MG tablet Take 1 tablet (81 mg total) by mouth daily. 03/17/16  Yes Dunn, Areta Haber, PA-C  atorvastatin (LIPITOR) 40 MG tablet Take 1 tablet (40 mg total) by mouth daily. Patient taking differently: Take 40 mg by mouth every evening.  12/16/19  Yes Iqra Rotundo, Kathlene November, MD  calcipotriene (DOVONOX) 0.005 % cream Apply topically as needed (to creases).    Yes [provider]  Cholecalciferol (VITAMIN D3) 2000 units TABS Take 1 capsule by mouth every evening.    Yes [provider]  clobetasol cream (TEMOVATE) [id number] % Apply 1 application topically 2 (two) times daily. As needed to hands and elbows   Yes [provider]  hydrochlorothiazide (HYDRODIURIL) 25 MG tablet Take 1 tablet (25 mg total) by mouth daily. 12/16/19  Yes Paras Kreider, Kathlene November, MD  hydrocortisone 2.5 % cream Apply 1 application topically 2 (two) times daily. As needed to face, crease, groin and butt   Yes [provider]  insulin aspart (NOVOLOG) 100 UNIT/ML injection Inject 0-9 Units into the skin 3 (three) times daily with meals. Patient taking differently: Inject into the skin 3 (three) times daily with meals. 10 am, 15 lunch, 20 pm 03/28/16  Yes Demetrios Loll, MD  insulin glargine (LANTUS) 100 UNIT/ML injection Inject 0.68 mLs (68 Units total) into the skin at bedtime. Patient taking differently: Inject 65 Units into the skin at bedtime.  03/28/16  Yes Demetrios Loll, MD  ketoconazole (NIZORAL) 2 % cream Apply 1 application topically 2 (two) times daily as needed for irritation. To groin, fave and buttocks.   Yes [provider]    levothyroxine (SYNTHROID, LEVOTHROID) 50 MCG tablet TAKE 1 TABLET BY MOUTH ONCE DAILY BEFORE BREAKFAST Patient taking differently: Take 25 mcg by mouth daily before breakfast.  01/01/19  Yes [name]., MD  losartan (COZAAR) 100 MG tablet Take 1 tablet (100 mg total) by mouth daily. Patient taking differently: Take 100 mg by mouth at bedtime.  12/16/19  Yes Minna Merritts, MD  Magnesium 250 MG TABS Take 250 mg by mouth daily as needed (swelling).    Yes [provider]  metoprolol succinate (TOPROL-XL) 50 MG 24 hr tablet Take 1 tablet (50 mg total) by mouth daily. Take with or immediately following a meal. 12/16/19 03/15/20 Yes Erykah Lippert, Kathlene November, MD  mometasone (ELOCON) 0.1 % ointment Apply topically daily. As needed to scalp and ears   Yes [provider]  nitroGLYCERIN (NITROSTAT) 0.4 MG SL tablet Place 1 tablet (0.4 mg total) under the tongue every 5 (five) minutes as needed for chest pain. 12/13/19  Yes Minna Merritts, MD  omeprazole (PRILOSEC) 20 MG capsule Take 1 capsule by mouth once daily Patient taking differently: Take 20 mg by mouth at bedtime.  09/17/19  Yes [name].,  MD  potassium chloride (K-DUR) 10 MEQ tablet Take 1-2 tablets (10-20 mEq total) by mouth as directed. Take 1 tablet (10 meq) daily and extra tablet if you take torsemide. Patient taking differently: Take 10 mEq by mouth 2 (two) times daily as needed (swelling).  11/02/18  Yes Minna Merritts, MD  torsemide (DEMADEX) 20 MG tablet Take 1 tablet (20 mg total) by mouth as needed (As needed for swelling and shortness of breath). Also take extra potassium 10 meq with this. 11/02/18 12/16/19 Yes Andre Gallego, Kathlene November, MD  vitamin C (ASCORBIC ACID) 500 MG tablet Take 500 mg by mouth at bedtime.    Yes [provider]    Inpatient Medications: Scheduled Meds: . insulin aspart  0-5 Units Subcutaneous QHS  . insulin aspart  0-9 Units Subcutaneous TID WC  . sodium chloride flush   3 mL Intravenous Once   Continuous Infusions: . heparin 1,150 Units/hr (12/16/19 1634)   PRN Meds:   Allergies:   No Known Allergies  Social History:   Social History   Socioeconomic History  . Marital status: Married    Spouse name: Not on file  . Number of children: 3  . Years of education: Not on file  . Highest education level: Some college, no degree  Occupational History  . Occupation: retired  Tobacco Use  . Smoking status: Former Smoker    Packs/day: 0.00    Years: 6.00    Pack years: 0.00    Types: Pipe, Cigars    Quit date: 12/23/1980    Years since quitting: 39.0  . Smokeless tobacco: Never Used  Substance and Sexual Activity  . Alcohol use: No  . Drug use: No  . Sexual activity: Never  Other Topics Concern  . Not on file  Social History Narrative   Retired.    Social Determinants of Health   Financial Resource Strain:   . Difficulty of Paying Living Expenses: Not on file  Food Insecurity:   . Worried About Charity fundraiser in the Last Year: Not on file  . Ran Out of Food in the Last Year: Not on file  Transportation Needs:   . Lack of Transportation (Medical): Not on file  . Lack of Transportation (Non-Medical): Not on file  Physical Activity: Inactive  . Days of Exercise per Week: 0 days  . Minutes of Exercise per Session: 0 min  Stress: No Stress Concern Present  . Feeling of Stress : Not at all  Social Connections: Unknown  . Frequency of Communication with Friends and Family: Patient refused  . Frequency of Social Gatherings with Friends and Family: Patient refused  . Attends Religious Services: Patient refused  . Active Member of Clubs or Organizations: Patient refused  . Attends Archivist Meetings: Patient refused  . Marital Status: Patient refused  Intimate Partner Violence: Unknown  . Fear of Current or Ex-Partner: Patient refused  . Emotionally Abused: Patient refused  . Physically Abused: Patient refused  . Sexually  Abused: Patient refused    Family History:    Family History  Problem Relation Age of Onset  . Heart failure Mother   . Heart attack Father   . Stroke Brother   . Heart failure Maternal Uncle   . Heart failure Maternal Grandfather   . Sleep apnea Daughter   . Sleep apnea Son   . Kidney Stones Son      ROS:  Please see the history of present illness.  Review of  Systems  Constitutional: Negative.   HENT: Negative.   Respiratory: Negative.   Cardiovascular: Positive for chest pain.  Gastrointestinal: Negative.   Musculoskeletal: Negative.   Neurological: Negative.   Psychiatric/Behavioral: Negative.   All other systems reviewed and are negative.   Physical Exam/Data:   Vitals:   12/16/19 1500 12/16/19 1630 12/16/19 1709 12/16/19 1713  BP: (!) 162/75 (!) 152/69 (!) 172/67   Pulse:  69 76   Resp: 18 15 19    Temp:   98 F (36.7 C)   TempSrc:   Oral   SpO2: 95% 95% 97%   Weight:    94.4 kg  Height:    5\' 7"  (1.702 m)   No intake or output data in the 24 hours ending 12/16/19 1830 Last 3 Weights 12/16/2019 12/16/2019 12/16/2019  Weight (lbs) 208 lb 1.6 oz 214 lb 8 oz 214 lb 8 oz  Weight (kg) 94.394 kg 97.297 kg 97.297 kg     Body mass index is 32.59 kg/m.  General:  Well nourished, well developed, in no acute distress, obese HEENT: normal Lymph: no adenopathy Neck: no JVD Endocrine:  No thryomegaly Vascular: No carotid bruits; FA pulses 2+ bilaterally without bruits  Cardiac:  normal S1, S2; RRR; no murmur  Lungs:  clear to auscultation bilaterally, no wheezing, rhonchi or rales  Abd: soft, nontender, no hepatomegaly  Ext: no edema Musculoskeletal:  No deformities, BUE and BLE strength normal and equal Skin: warm and dry  Neuro:  CNs 2-12 intact, no focal abnormalities noted Psych:  Normal affect   EKG:  The EKG was personally reviewed and demonstrates:   Normal sinus rhythm with PACs in a bigeminal pattern Telemetry:  Telemetry was personally reviewed and  demonstrates: Normal sinus rhythm  Relevant CV Studies:   Laboratory Data:  High Sensitivity Troponin:   Recent Labs  Lab 12/16/19 1047 12/16/19 1338 12/16/19 1640  TROPONINIHS 450* 477* 588*     Chemistry Recent Labs  Lab 12/16/19 1338  NA 138  K 3.6  CL 103  CO2 27  GLUCOSE 172*  BUN 17  CREATININE 1.07  CALCIUM 8.9  GFRNONAA 60*  GFRAA >60  ANIONGAP 8    Recent Labs  Lab 12/16/19 1338  PROT 6.8  ALBUMIN 3.4*  AST 24  ALT 21  ALKPHOS 47  BILITOT 0.6   Hematology Recent Labs  Lab 12/16/19 1338  WBC 8.6  RBC 4.57  HGB 13.9  HCT 40.9  MCV 89.5  MCH 30.4  MCHC 34.0  RDW 13.4  PLT 195   BNPNo results for input(s): BNP, PROBNP in the last 168 hours.  DDimer No results for input(s): DDIMER in the last 168 hours.   Radiology/Studies:  DG Chest 2 View  Result Date: 12/16/2019 CLINICAL DATA:  Elevated troponins. EXAM: CHEST - 2 VIEW COMPARISON:  03/22/2016 and CT chest 03/21/2016. FINDINGS: Trachea is midline. Heart size normal. Thoracic aorta is calcified. Lungs are clear. No pleural fluid. Flowing anterior osteophytosis in the thoracic spine. IMPRESSION: No acute findings. Electronically Signed   By: Lorin Picket M.D.   On: 12/16/2019 14:02   CT Head Wo Contrast  Result Date: 12/16/2019 CLINICAL DATA:  Question intracranial hemorrhage. EXAM: CT HEAD WITHOUT CONTRAST TECHNIQUE: Contiguous axial images were obtained from the base of the skull through the vertex without intravenous contrast. COMPARISON:  02/03/2017 FINDINGS: Brain: Ordinary age related volume loss. No evidence of old or acute focal infarction, mass lesion, hemorrhage, hydrocephalus or extra-axial collection. Vascular: There is atherosclerotic  calcification of the major vessels at the base of the brain. Skull: Negative Sinuses/Orbits: Clear/normal Other: None IMPRESSION: No acute or focal CT finding.  Age related atrophy. Electronically Signed   By: Nelson Chimes M.D.   On: 12/16/2019 15:54       Assessment and Plan:   Coronary artery disease with unstable angina Possible non-STEMI, troponin up to 588 Pain last Thursday but has smoldered since that time Was seen in the clinic today reported having some vague chest pain on the left, Troponin ordered initially 450 which has been trending upwards --- Agree with heparin, beta-blocker --Known disease, plan for cardiac catheterization tomorrow Prior CABG 20 years ago  Chronic diastolic CHF Chronic shortness of breath May need right and left heart catheterization He is on HCTZ Previously not taking torsemide on a regular basis  Atrial tachycardia Noted in the office today Recommend we continue his metoprolol succinate 50 mg daily if tolerated    For questions or updates, please contact [facility] Please consult [email or web address] for contact info under     Signed, Ida Rogue, MD  12/16/2019 6:30 PM

## 2019-12-16 NOTE — ED Triage Notes (Signed)
Pt presents to ED, states saw his cardiologist Dr. Rockey Situ this morning and was referred to ED to "be prepped for a procedure for them to look at my heart". Pt states they called and told him he had abnormal bloodwork, upon review of bloodwork, pt with elevated high sensitivity trop of 450 PTA. Pt denies active CP at this time. Pt visualized in NAD at this time.   This RN spoke with Dr. Corky Downs regarding lab results and orders, see orders.

## 2019-12-16 NOTE — ED Notes (Signed)
EDP, Isaacs to bedside; update provided.

## 2019-12-16 NOTE — ED Notes (Signed)
Pt transported to CT ?

## 2019-12-16 NOTE — ED Provider Notes (Signed)
[facility] Emergency Department Provider Note  ____________________________________________   First MD Initiated Contact with Patient 12/16/19 1344     (approximate)  I have reviewed the triage vital signs and the nursing notes.   HISTORY  Chief Complaint Chest Pain    HPI Joseph [name] is a 86 y.o. male with past medical history as below including hypertension, hyperlipidemia, chronic CHF, coronary disease, here with chest pain.  The patient reports that on Thursday, he awoke with a dull, burning, substernal chest pressure.  He felt mildly short of breath with this.  It persisted throughout the night.  He also states that he very transiently had difficulty speaking in which he felt like he could not form the right words, though his speech was not slurred.  This then resolved.  He has since had intermittent chest pain.  He called cardiology and went to see Dr. Rockey Situ today, and was found to have an elevated troponin.  He sent here for evaluation.  Said no chest pain today.  He has had no recurrence of his speech issues.  He has had no focal numbness or weakness.  He does have known coronary disease.  He is been taking his medications as prescribed.  No current complaints.        Past Medical History:  Diagnosis Date  . CAD (coronary artery disease)    a. s/p CABG;  b. 07/2012 Cath: 3VD including 80 dLCX (med Rx), 4/4 patent grafts.  . Chronic diastolic CHF (congestive heart failure) ([place])    a. 07/2012 Echo: EF 55-60%, no rwma, Gr 1 DD, mild MR;  04/2014 Echo: EF 55-60%, no rwma, mild MR, mildly dil LA.  . Diabetes mellitus, type 2 ([place])   . HTN (hypertension)   . Hyperlipidemia   . Hypothyroidism   . Lower extremity edema    a. 03/2014 amlodipine d/c'd.  Marland Kitchen Spinal stenosis     Patient Active Problem List   Diagnosis Date Noted  . Acute cholecystitis   . Calculus of gallbladder with acute cholecystitis without obstruction   . Diverticulitis of large  intestine without perforation or abscess without bleeding   . Pain of upper abdomen   . SOB (shortness of breath)   . S/P CABG (coronary artery bypass graft)   . Morbid obesity due to excess calories ([place])   . Acute respiratory failure with hypoxia ([place]) 03/21/2016  . Back pain, chronic 07/28/2015  . Benign fibroma of prostate 07/28/2015  . Acid reflux 07/28/2015  . Adult hypothyroidism 07/28/2015  . Eunuchoidism 07/28/2015  . Neuropathy 07/28/2015  . Arthritis, degenerative 07/28/2015  . Psoriasis 07/28/2015  . Lumbar spondylosis 05/08/2015  . Obesity 09/09/2014  . Bilateral leg weakness 09/09/2014  . Acute on chronic diastolic heart failure ([place]) 05/13/2014  . Bilateral leg edema 04/15/2014  . Bradycardia 08/13/2013  . Edema 06/21/2012  . Fatigue 09/15/2011  . Diabetes mellitus with peripheral vascular disease ([place]) 09/15/2011  . DYSPNEA 06/09/2009  . Hyperlipidemia 06/07/2009  . HYPERTENSION, BENIGN 06/07/2009  . CAD, ARTERY BYPASS GRAFT 06/07/2009    Past Surgical History:  Procedure Laterality Date  . APPENDECTOMY    . CARDIAC CATHETERIZATION  07/2012   [facility]/no stents  . CHOLECYSTECTOMY    . CORNEAL TRANSPLANT    . CORONARY ARTERY BYPASS GRAFT  1994  . HEMORRHOID SURGERY    . PROSTATE BIOPSY    . TOE SURGERY     ingrown toe nail    Prior to Admission medications  Medication Sig Start Date End Date Taking? Authorizing Provider  aspirin EC 81 MG tablet Take 1 tablet (81 mg total) by mouth daily. 03/17/16   Dunn, Areta Haber, PA-C  atorvastatin (LIPITOR) 40 MG tablet Take 1 tablet (40 mg total) by mouth daily. 12/16/19   Minna Merritts, MD  blood glucose meter kit and supplies KIT Dispense Accuchek Aviva glucometer with 120 test strips.  Test glucose level 3 times per day with meals and at bedtime.  Dispense based on patient and insurance preference. Use up to four times daily as directed. (FOR ICD-9 250.00, 250.01, ICD 10 E11.9). 03/28/16   Lance Coon, MD    calcipotriene (DOVONOX) 0.005 % cream Apply topically as needed (to creases).     [provider]  Cholecalciferol (VITAMIN D3) 2000 units TABS Take 1 capsule by mouth daily.    [provider]  clobetasol cream (TEMOVATE) 8.24 % Apply 1 application topically 2 (two) times daily. As needed to hands and elbows    [provider]  Continuous Blood Gluc Receiver (FREESTYLE LIBRE 14 DAY READER) DEVI USE AS DIRECTED AS NEEDED 12/09/19   [name]., MD  Continuous Blood Gluc Sensor (FREESTYLE LIBRE 14 DAY SENSOR) MISC USE AS DIRECTED EVERY 14 DAYS 12/13/19   [name]., MD  glucose monitoring kit (FREESTYLE) monitoring kit 1 each by Does not apply route as needed for other. 11/27/17   [name]., MD  hydrochlorothiazide (HYDRODIURIL) 25 MG tablet Take 1 tablet (25 mg total) by mouth daily. 12/16/19   Minna Merritts, MD  HYDROcodone-acetaminophen (NORCO/VICODIN) 5-325 MG tablet Take 1 tablet by mouth every 8 (eight) hours as needed for moderate pain. Please stop taking Tramadol 50 mg. 11/21/19   [name]., MD  hydrocortisone 2.5 % cream Apply 1 application topically 2 (two) times daily. As needed to face, crease, groin and butt    [provider]  insulin aspart (NOVOLOG) 100 UNIT/ML injection Inject 0-9 Units into the skin 3 (three) times daily with meals. Patient taking differently: Inject 0-9 Units into the skin 3 (three) times daily with meals. 10 am, 10 lunch, 15 pm 03/28/16   Demetrios Loll, MD  insulin glargine (LANTUS) 100 UNIT/ML injection Inject 0.68 mLs (68 Units total) into the skin at bedtime. Patient taking differently: Inject 40 Units into the skin at bedtime.  03/28/16   Demetrios Loll, MD  ketoconazole (NIZORAL) 2 % cream Apply 1 application topically 2 (two) times daily as needed for irritation. To groin, fave and buttocks.    [provider]  levothyroxine (SYNTHROID, LEVOTHROID) 50 MCG tablet TAKE 1 TABLET BY  MOUTH ONCE DAILY BEFORE BREAKFAST Patient taking differently: Take 25 mcg by mouth daily before breakfast.  01/01/19   [name]., MD  losartan (COZAAR) 100 MG tablet Take 1 tablet (100 mg total) by mouth daily. 12/16/19   Minna Merritts, MD  Magnesium 250 MG TABS Take by mouth daily as needed.    [provider]  metoprolol succinate (TOPROL-XL) 50 MG 24 hr tablet Take 1 tablet (50 mg total) by mouth daily. Take with or immediately following a meal. 12/16/19 03/15/20  Gollan, Kathlene November, MD  mometasone (ELOCON) 0.1 % ointment Apply topically daily. As needed to scalp and ears    [provider]  nitroGLYCERIN (NITROSTAT) 0.4 MG SL tablet Place 1 tablet (0.4 mg total) under the tongue every 5 (five) minutes as needed for chest pain.  12/13/19   Minna Merritts, MD  omeprazole (PRILOSEC) 20 MG capsule Take 1 capsule by mouth once daily 09/17/19   [name]., MD  potassium chloride (K-DUR) 10 MEQ tablet Take 1-2 tablets (10-20 mEq total) by mouth as directed. Take 1 tablet (10 meq) daily and extra tablet if you take torsemide. 11/02/18   Minna Merritts, MD  torsemide (DEMADEX) 20 MG tablet Take 1 tablet (20 mg total) by mouth as needed (As needed for swelling and shortness of breath). Also take extra potassium 10 meq with this. 11/02/18 01/31/19  Minna Merritts, MD  traMADol (ULTRAM) 50 MG tablet Take 1 tablet (50 mg total) by mouth every 6 (six) hours as needed. 11/12/19   Virginia Crews, MD  vitamin C (ASCORBIC ACID) 500 MG tablet Take 500 mg by mouth daily.    [provider]    Allergies Patient has no known allergies.  Family History  Problem Relation Age of Onset  . Heart failure Mother   . Heart attack Father   . Stroke Brother   . Heart failure Maternal Uncle   . Heart failure Maternal Grandfather   . Sleep apnea Daughter   . Sleep apnea Son   . Kidney Stones Son     Social History Social History   Tobacco Use  .  Smoking status: Former Smoker    Packs/day: 0.00    Years: 6.00    Pack years: 0.00    Types: Pipe, Cigars    Quit date: 12/23/1980    Years since quitting: 39.0  . Smokeless tobacco: Never Used  Substance Use Topics  . Alcohol use: No  . Drug use: No    Review of Systems  Review of Systems  Constitutional: Positive for fatigue. Negative for chills and fever.  HENT: Negative for sore throat.   Respiratory: Positive for chest tightness. Negative for shortness of breath.   Cardiovascular: Positive for chest pain.  Gastrointestinal: Negative for abdominal pain.  Genitourinary: Negative for flank pain.  Musculoskeletal: Negative for neck pain.  Skin: Negative for rash and wound.  Allergic/Immunologic: Negative for immunocompromised state.  Neurological: Positive for speech difficulty. Negative for weakness and numbness.  Hematological: Does not bruise/bleed easily.  All other systems reviewed and are negative.    ____________________________________________  PHYSICAL EXAM:      VITAL SIGNS: ED Triage Vitals [12/16/19 1330]  Enc Vitals Group     BP (!) 151/75     Pulse Rate 69     Resp 18     Temp 98.7 F (37.1 C)     Temp Source Oral     SpO2 97 %     Weight 214 lb 8 oz (97.3 kg)     Height 5' 7"  (1.702 m)     Head Circumference      Peak Flow      Pain Score 0     Pain Loc      Pain Edu?      Excl. in [place]?      Physical Exam Vitals and nursing note reviewed.  Constitutional:      General: He is not in acute distress.    Appearance: He is well-developed.  HENT:     Head: Normocephalic and atraumatic.  Eyes:     Conjunctiva/sclera: Conjunctivae normal.  Cardiovascular:     Rate and Rhythm: Normal rate and regular rhythm.     Heart sounds: Normal heart sounds. No murmur. No friction rub.  Pulmonary:     Effort: Pulmonary effort is normal. No respiratory distress.     Breath sounds: Normal breath sounds. No wheezing or rales.  Abdominal:     General: There  is no distension.     Palpations: Abdomen is soft.     Tenderness: There is no abdominal tenderness.  Musculoskeletal:     Cervical back: Neck supple.  Skin:    General: Skin is warm.     Capillary Refill: Capillary refill takes less than 2 seconds.  Neurological:     Mental Status: He is alert and oriented to person, place, and time.     Motor: No abnormal muscle tone.       ____________________________________________   LABS (all labs ordered are listed, but only abnormal results are displayed)  Labs Reviewed  CBC WITH DIFFERENTIAL/PLATELET - Abnormal; Notable for the following components:      Result Value   Eosinophils Absolute 1.1 (*)    All other components within normal limits  COMPREHENSIVE METABOLIC PANEL - Abnormal; Notable for the following components:   Glucose, Bld 172 (*)    Albumin 3.4 (*)    GFR calc non Af Amer 60 (*)    All other components within normal limits  TROPONIN I (HIGH SENSITIVITY) - Abnormal; Notable for the following components:   Troponin I (High Sensitivity) 477 (*)    All other components within normal limits  PROTIME-INR  APTT  HEPARIN LEVEL (UNFRACTIONATED)  TROPONIN I (HIGH SENSITIVITY)    ____________________________________________  EKG: Sinus rhythm with PACs.  Left bundle branch block.  Ventricular rate 69, PR 178, QRS 124, QTc 456.  No acute ST elevations or depressions. ________________________________________  RADIOLOGY All imaging, including plain films, CT scans, and ultrasounds, independently reviewed by me, and interpretations confirmed via formal radiology reads.  ED MD interpretation:   Chest x-ray: Clear  Official radiology report(s): DG Chest 2 View  Result Date: 12/16/2019 CLINICAL DATA:  Elevated troponins. EXAM: CHEST - 2 VIEW COMPARISON:  03/22/2016 and CT chest 03/21/2016. FINDINGS: Trachea is midline. Heart size normal. Thoracic aorta is calcified. Lungs are clear. No pleural fluid. Flowing anterior  osteophytosis in the thoracic spine. IMPRESSION: No acute findings. Electronically Signed   By: Lorin Picket M.D.   On: 12/16/2019 14:02   CT Head Wo Contrast  Result Date: 12/16/2019 CLINICAL DATA:  Question intracranial hemorrhage. EXAM: CT HEAD WITHOUT CONTRAST TECHNIQUE: Contiguous axial images were obtained from the base of the skull through the vertex without intravenous contrast. COMPARISON:  02/03/2017 FINDINGS: Brain: Ordinary age related volume loss. No evidence of old or acute focal infarction, mass lesion, hemorrhage, hydrocephalus or extra-axial collection. Vascular: There is atherosclerotic calcification of the major vessels at the base of the brain. Skull: Negative Sinuses/Orbits: Clear/normal Other: None IMPRESSION: No acute or focal CT finding.  Age related atrophy. Electronically Signed   By: Nelson Chimes M.D.   On: 12/16/2019 15:54    ____________________________________________  PROCEDURES   Procedure(s) performed (including Critical Care):  .Critical Care Performed by: Duffy Bruce, MD Authorized by: Duffy Bruce, MD   Critical care provider statement:    Critical care time (minutes):  35   Critical care time was exclusive of:  Separately billable procedures and treating other patients and teaching time   Critical care was necessary to treat or prevent imminent or life-threatening deterioration of the following conditions:  Circulatory failure and cardiac failure   Critical care was time spent personally by me on the following activities:  Development of treatment plan with patient or surrogate, discussions with consultants, evaluation of patient's response to treatment, examination of patient, obtaining history from patient or surrogate, ordering and performing treatments and interventions, ordering and review of laboratory studies, ordering and review of radiographic studies, pulse oximetry, re-evaluation of patient's condition and review of old charts   I  assumed direction of critical care for this patient from another provider in my specialty: no      ____________________________________________  INITIAL IMPRESSION / MDM / [place] / ED COURSE  As part of my medical decision making, I reviewed the following data within the [place] notes reviewed and incorporated, Old chart reviewed, Notes from prior ED visits, and [place] Controlled Substance Database       *KANO HECKMANN was evaluated in Emergency Department on 12/16/2019 for the symptoms described in the history of present illness. He was evaluated in the context of the global COVID-19 pandemic, which necessitated consideration that the patient might be at risk for infection with the SARS-CoV-2 virus that causes COVID-19. Institutional protocols and algorithms that pertain to the evaluation of patients at risk for COVID-19 are in a state of rapid change based on information released by regulatory bodies including the CDC and federal and state organizations. These policies and algorithms were followed during the patient's care in the ED.  Some ED evaluations and interventions may be delayed as a result of limited staffing during the pandemic.*     Medical Decision Making: 86 year old male here with chest pain, now resolved.  Troponin greater than 400, raising concern for ACS/NSTEMI.  EKG reviewed, shows no ST elevations.  He does have frequent PACs in a pattern of bigeminy.  Otherwise, he did report a transient episode of speech difficulty.  It is somewhat unclear whether this was due to waking up with the pain, versus transient expressive aphasia, versus possible mild altered mental status in the setting of myocardial ischemia.  Will obtain CT head for evaluation prior to starting heparin. Admit to medicine.  Discussed with Dr. Fletcher Anon, who is aware. Plan is to cath tomorrow AM.  ____________________________________________  FINAL CLINICAL IMPRESSION(S) /  ED DIAGNOSES  Final diagnoses:  NSTEMI (non-ST elevated myocardial infarction) ([place])     MEDICATIONS GIVEN DURING THIS VISIT:  Medications  sodium chloride flush (NS) 0.9 % injection 3 mL (3 mLs Intravenous Not Given 12/16/19 1404)  heparin bolus via infusion 4,000 Units (has no administration in time range)    Followed by  heparin ADULT infusion 100 units/mL (25000 units/270m sodium chloride 0.45%) (has no administration in time range)     ED Discharge Orders    None       Note:  This document was prepared using Dragon voice recognition software and may include unintentional dictation errors.   IDuffy Bruce MD 12/16/19 1558

## 2019-12-16 NOTE — H&P (Signed)
[facility] [place] NAME: Joseph [name]    MR#:  [id number]  DATE OF BIRTH:  04-Jun-1925  DATE OF ADMISSION:  12/16/2019  PRIMARY CARE PHYSICIAN: [name]., MD   REQUESTING/REFERRING PHYSICIAN: Dr Ellender Hose  Patient coming from :home/ Dr Donivan Scull office   CHIEF COMPLAINT:  chest pain on and off since Thursday  HISTORY OF PRESENT ILLNESS:  Joseph [name]  is a 86 y.o. male with a known history of CAD status post CABG, chronic diastolic congestive heart failure, type II diabetes on insulin, hyperlipidemia, peripheral arterial disease comes to the emergency room from Dr. Donivan Scull office after he started having chest pain last Thursday. Denies any radiation of pain to the back jaw over the arm. EKG showed normal sinus rhythm with mild tachycardia. Routine troponin was done and was elevated at 450. Dr. Rockey Situ recommended patient be transferred to the emergency room for further evaluation management.  ED course: in the ER patient has been hemodynamically stable. He is denying any chest pain. His stats are stable. Second troponin is 477. ER physician spoke with [facility] cardiology and heparin drip has been initiated. Patient is being admitted for acute coronary syndrome/NST EMI received aspirin, nitro PAST MEDICAL HISTORY:   Past Medical History:  Diagnosis Date  . CAD (coronary artery disease)    a. s/p CABG;  b. 07/2012 Cath: 3VD including 80 dLCX (med Rx), 4/4 patent grafts.  . Chronic diastolic CHF (congestive heart failure) ([place])    a. 07/2012 Echo: EF 55-60%, no rwma, Gr 1 DD, mild MR;  04/2014 Echo: EF 55-60%, no rwma, mild MR, mildly dil LA.  . Diabetes mellitus, type 2 ([place])   . HTN (hypertension)   . Hyperlipidemia   . Hypothyroidism   . Lower extremity edema    a. 03/2014 amlodipine d/c'd.  Marland Kitchen Spinal stenosis     PAST SURGICAL HISTOIRY:   Past Surgical History:  Procedure Laterality Date  . APPENDECTOMY    . CARDIAC  CATHETERIZATION  07/2012   [facility]/no stents  . CHOLECYSTECTOMY    . CORNEAL TRANSPLANT    . CORONARY ARTERY BYPASS GRAFT  1994  . HEMORRHOID SURGERY    . PROSTATE BIOPSY    . TOE SURGERY     ingrown toe nail    SOCIAL HISTORY:   Social History   Tobacco Use  . Smoking status: Former Smoker    Packs/day: 0.00    Years: 6.00    Pack years: 0.00    Types: Pipe, Cigars    Quit date: 12/23/1980    Years since quitting: 39.0  . Smokeless tobacco: Never Used  Substance Use Topics  . Alcohol use: No    FAMILY HISTORY:   Family History  Problem Relation Age of Onset  . Heart failure Mother   . Heart attack Father   . Stroke Brother   . Heart failure Maternal Uncle   . Heart failure Maternal Grandfather   . Sleep apnea Daughter   . Sleep apnea Son   . Kidney Stones Son     DRUG ALLERGIES:  No Known Allergies  REVIEW OF SYSTEMS:  Review of Systems  Constitutional: Negative for chills, fever and weight loss.  HENT: Negative for ear discharge, ear pain and nosebleeds.   Eyes: Negative for blurred vision, pain and discharge.  Respiratory: Negative for sputum production, shortness of breath, wheezing and stridor.   Cardiovascular: Positive for chest pain. Negative for palpitations, orthopnea and  PND.  Gastrointestinal: Negative for abdominal pain, diarrhea, nausea and vomiting.  Genitourinary: Negative for frequency and urgency.  Musculoskeletal: Negative for back pain and joint pain.  Neurological: Positive for weakness. Negative for sensory change, speech change and focal weakness.  Psychiatric/Behavioral: Negative for depression and hallucinations. The patient is not nervous/anxious.      MEDICATIONS AT HOME:   Prior to Admission medications   Medication Sig Start Date End Date Taking? Authorizing Provider  aspirin EC 81 MG tablet Take 1 tablet (81 mg total) by mouth daily. 03/17/16  Yes Dunn, Areta Haber, PA-C  atorvastatin (LIPITOR) 40 MG tablet Take 1 tablet (40 mg total)  by mouth daily. Patient taking differently: Take 40 mg by mouth every evening.  12/16/19  Yes Gollan, Kathlene November, MD  calcipotriene (DOVONOX) 0.005 % cream Apply topically as needed (to creases).    Yes [provider]  Cholecalciferol (VITAMIN D3) 2000 units TABS Take 1 capsule by mouth every evening.    Yes [provider]  clobetasol cream (TEMOVATE) [id number] % Apply 1 application topically 2 (two) times daily. As needed to hands and elbows   Yes [provider]  hydrochlorothiazide (HYDRODIURIL) 25 MG tablet Take 1 tablet (25 mg total) by mouth daily. 12/16/19  Yes Gollan, Kathlene November, MD  hydrocortisone 2.5 % cream Apply 1 application topically 2 (two) times daily. As needed to face, crease, groin and butt   Yes [provider]  insulin aspart (NOVOLOG) 100 UNIT/ML injection Inject 0-9 Units into the skin 3 (three) times daily with meals. Patient taking differently: Inject into the skin 3 (three) times daily with meals. 10 am, 15 lunch, 20 pm 03/28/16  Yes Demetrios Loll, MD  insulin glargine (LANTUS) 100 UNIT/ML injection Inject 0.68 mLs (68 Units total) into the skin at bedtime. Patient taking differently: Inject 65 Units into the skin at bedtime.  03/28/16  Yes Demetrios Loll, MD  ketoconazole (NIZORAL) 2 % cream Apply 1 application topically 2 (two) times daily as needed for irritation. To groin, fave and buttocks.   Yes [provider]  levothyroxine (SYNTHROID, LEVOTHROID) 50 MCG tablet TAKE 1 TABLET BY MOUTH ONCE DAILY BEFORE BREAKFAST Patient taking differently: Take 25 mcg by mouth daily before breakfast.  01/01/19  Yes [name]., MD  losartan (COZAAR) 100 MG tablet Take 1 tablet (100 mg total) by mouth daily. Patient taking differently: Take 100 mg by mouth at bedtime.  12/16/19  Yes Minna Merritts, MD  Magnesium 250 MG TABS Take 250 mg by mouth daily as needed (swelling).    Yes [provider]  metoprolol succinate (TOPROL-XL) 50 MG 24  hr tablet Take 1 tablet (50 mg total) by mouth daily. Take with or immediately following a meal. 12/16/19 03/15/20 Yes Gollan, Kathlene November, MD  mometasone (ELOCON) 0.1 % ointment Apply topically daily. As needed to scalp and ears   Yes [provider]  nitroGLYCERIN (NITROSTAT) 0.4 MG SL tablet Place 1 tablet (0.4 mg total) under the tongue every 5 (five) minutes as needed for chest pain. 12/13/19  Yes Minna Merritts, MD  omeprazole (PRILOSEC) 20 MG capsule Take 1 capsule by mouth once daily Patient taking differently: Take 20 mg by mouth at bedtime.  09/17/19  Yes [name]., MD  potassium chloride (K-DUR) 10 MEQ tablet Take 1-2 tablets (10-20 mEq total) by mouth as directed. Take 1 tablet (10 meq) daily and extra tablet if you take torsemide. Patient taking differently: Take  10 mEq by mouth 2 (two) times daily as needed (swelling).  11/02/18  Yes Minna Merritts, MD  torsemide (DEMADEX) 20 MG tablet Take 1 tablet (20 mg total) by mouth as needed (As needed for swelling and shortness of breath). Also take extra potassium 10 meq with this. 11/02/18 12/16/19 Yes Gollan, Kathlene November, MD  vitamin C (ASCORBIC ACID) 500 MG tablet Take 500 mg by mouth at bedtime.    Yes [provider]      VITAL SIGNS:  Blood pressure (!) 162/75, pulse 76, temperature 98.7 F (37.1 C), temperature source Oral, resp. rate 18, height 5\' 7"  (1.702 m), weight 97.3 kg, SpO2 95 %.  PHYSICAL EXAMINATION:  GENERAL:  86 y.o.-year-old patient lying in the bed with no acute distress.  EYES: Pupils equal, round, reactive to light and accommodation. No scleral icterus.  HEENT: Head atraumatic, normocephalic. Oropharynx and nasopharynx clear.  NECK:  Supple, no jugular venous distention. No thyroid enlargement, no tenderness.  LUNGS: Normal breath sounds bilaterally, no wheezing, rales,rhonchi or crepitation. No use of accessory muscles of respiration.  CARDIOVASCULAR: S1, S2 normal. No murmurs, rubs,  or gallops.  ABDOMEN: Soft, nontender, nondistended. Bowel sounds present. No organomegaly or mass.  EXTREMITIES: No pedal edema, cyanosis, or clubbing.  NEUROLOGIC: Cranial nerves II through XII are intact. Muscle strength 5/5 in all extremities. Sensation intact. Gait not checked.  PSYCHIATRIC: The patient is alert and oriented x 3.  SKIN: No obvious rash, lesion, or ulcer.   LABORATORY PANEL:   CBC Recent Labs  Lab 12/16/19 1338  WBC 8.6  HGB 13.9  HCT 40.9  PLT 195   ------------------------------------------------------------------------------------------------------------------  Chemistries  Recent Labs  Lab 12/16/19 1338  NA 138  K 3.6  CL 103  CO2 27  GLUCOSE 172*  BUN 17  CREATININE 1.07  CALCIUM 8.9  AST 24  ALT 21  ALKPHOS 47  BILITOT 0.6   ------------------------------------------------------------------------------------------------------------------  Cardiac Enzymes No results for input(s): TROPONINI in the last 168 hours. ------------------------------------------------------------------------------------------------------------------  RADIOLOGY:  DG Chest 2 View  Result Date: 12/16/2019 CLINICAL DATA:  Elevated troponins. EXAM: CHEST - 2 VIEW COMPARISON:  03/22/2016 and CT chest 03/21/2016. FINDINGS: Trachea is midline. Heart size normal. Thoracic aorta is calcified. Lungs are clear. No pleural fluid. Flowing anterior osteophytosis in the thoracic spine. IMPRESSION: No acute findings. Electronically Signed   By: Lorin Picket M.D.   On: 12/16/2019 14:02   CT Head Wo Contrast  Result Date: 12/16/2019 CLINICAL DATA:  Question intracranial hemorrhage. EXAM: CT HEAD WITHOUT CONTRAST TECHNIQUE: Contiguous axial images were obtained from the base of the skull through the vertex without intravenous contrast. COMPARISON:  02/03/2017 FINDINGS: Brain: Ordinary age related volume loss. No evidence of old or acute focal infarction, mass lesion, hemorrhage,  hydrocephalus or extra-axial collection. Vascular: There is atherosclerotic calcification of the major vessels at the base of the brain. Skull: Negative Sinuses/Orbits: Clear/normal Other: None IMPRESSION: No acute or focal CT finding.  Age related atrophy. Electronically Signed   By: Nelson Chimes M.D.   On: 12/16/2019 15:54    EKG:    IMPRESSION AND PLAN:  Joseph [name]  is a 86 y.o. male with a known history of CAD status post CABG, chronic diastolic congestive heart failure, type II diabetes on insulin, hyperlipidemia, peripheral arterial disease comes to the emergency room from Dr. Donivan Scull office after he started having chest pain last Thursday. Denies any radiation of pain to the back jaw over the arm. EKG showed normal  sinus rhythm with mild tachycardia.  1. Acute coronary syndrome/NSTEMI in the setting of coronary artery disease status post CABG in the past -admit patient to telemetry -IV heparin drip -cardiology consultation placed with [facility] cardiology-- Dr. Fletcher Anon aware -continue aspirin, nitro, beta blockers, statins -cardiac cath scheduled for tomorrow.  -NPO after midnight  2. Type II diabetes on insulin -sliding scale insulin -will give Lantus 20 units tonight since patient is going to be NPO after midnight  3. Hyperlipidemia - continue statin  4. History of CHF, chronic diastolic -patient is not in heart failure -he has chronic left lower extremity mild pedal edema. Will resume torsemide as needed after cardiac cath  5. DVT prophylaxis -already on heparin drip     Family Communication : patient Consults : [facility] cardiology Code Status : full code per patient discussed in the ER DVT prophylaxis : heparin drip  TOTAL TIME TAKING CARE OF THIS PATIENT: *50* minutes.    [name] M.D  [facility]     CC: Primary care physician; [name]., MD

## 2019-12-17 ENCOUNTER — Inpatient Hospital Stay (HOSPITAL_COMMUNITY)
Admit: 2019-12-17 | Discharge: 2019-12-17 | Disposition: A | Discharge status: Home / Self Care | Payer: PPO | Attending: Internal Medicine | Admitting: Internal Medicine

## 2019-12-17 ENCOUNTER — Encounter
Admission: EM | Disposition: A | Discharge status: Home / Self Care | Payer: Self-pay | Source: Home / Self Care | Attending: Family Medicine

## 2019-12-17 DIAGNOSIS — I214 Non-ST elevation (NSTEMI) myocardial infarction: Secondary | ICD-10-CM

## 2019-12-17 DIAGNOSIS — I25810 Atherosclerosis of coronary artery bypass graft(s) without angina pectoris: Secondary | ICD-10-CM

## 2019-12-17 DIAGNOSIS — E785 Hyperlipidemia, unspecified: Secondary | ICD-10-CM

## 2019-12-17 DIAGNOSIS — I447 Left bundle-branch block, unspecified: Secondary | ICD-10-CM

## 2019-12-17 DIAGNOSIS — I2510 Atherosclerotic heart disease of native coronary artery without angina pectoris: Secondary | ICD-10-CM

## 2019-12-17 DIAGNOSIS — I441 Atrioventricular block, second degree: Secondary | ICD-10-CM

## 2019-12-17 DIAGNOSIS — I340 Nonrheumatic mitral (valve) insufficiency: Secondary | ICD-10-CM

## 2019-12-17 HISTORY — PX: LEFT HEART CATH AND CORS/GRAFTS ANGIOGRAPHY: CATH118250

## 2019-12-17 LAB — LIPID PANEL
Cholesterol: 121 mg/dL (ref 0–200)
HDL: 34 mg/dL — ABNORMAL LOW (ref >40)
LDL Cholesterol: 67 mg/dL (ref 0–99)
Total CHOL/HDL Ratio: 3.6 RATIO
Triglycerides: 98 mg/dL (ref <150)
VLDL: 20 mg/dL (ref 0–40)

## 2019-12-17 LAB — SARS CORONAVIRUS 2 (TAT 6-24 HRS): SARS Coronavirus 2: NEGATIVE

## 2019-12-17 LAB — MAGNESIUM: Magnesium: 1.6 mg/dL — ABNORMAL LOW (ref 1.7–2.4)

## 2019-12-17 LAB — GLUCOSE, CAPILLARY
Glucose-Capillary: 171 mg/dL — ABNORMAL HIGH (ref 70–99)
Glucose-Capillary: 180 mg/dL — ABNORMAL HIGH (ref 70–99)
Glucose-Capillary: 184 mg/dL — ABNORMAL HIGH (ref 70–99)
Glucose-Capillary: 186 mg/dL — ABNORMAL HIGH (ref 70–99)
Glucose-Capillary: 196 mg/dL — ABNORMAL HIGH (ref 70–99)

## 2019-12-17 LAB — TSH: TSH: 3.306 u[IU]/mL (ref 0.350–4.500)

## 2019-12-17 LAB — HEPARIN LEVEL (UNFRACTIONATED): Heparin Unfractionated: 0.72 IU/mL — ABNORMAL HIGH (ref 0.30–0.70)

## 2019-12-17 SURGERY — LEFT HEART CATH AND CORS/GRAFTS ANGIOGRAPHY
Anesthesia: Moderate Sedation

## 2019-12-17 MED ORDER — HEPARIN (PORCINE) IN NACL 1000-0.9 UT/500ML-% IV SOLN
INTRAVENOUS | Status: AC
  Filled 2019-12-17: qty 1000

## 2019-12-17 MED ORDER — MIDAZOLAM HCL 2 MG/2ML IJ SOLN
INTRAMUSCULAR | Status: AC
  Filled 2019-12-17: qty 2

## 2019-12-17 MED ORDER — CLOPIDOGREL BISULFATE 75 MG PO TABS
600.0000 mg | ORAL_TABLET | Freq: Once | ORAL | Status: AC
  Administered 2019-12-17: 10:00:00 600 mg via ORAL
  Filled 2019-12-17: qty 8

## 2019-12-17 MED ORDER — HEPARIN SODIUM (PORCINE) 1000 UNIT/ML IJ SOLN
INTRAMUSCULAR | Status: AC
  Filled 2019-12-17: qty 1

## 2019-12-17 MED ORDER — SODIUM CHLORIDE 0.9 % IV SOLN: 250.0000 mL | INTRAVENOUS | Status: DC | PRN

## 2019-12-17 MED ORDER — FENTANYL CITRATE (PF) 100 MCG/2ML IJ SOLN
INTRAMUSCULAR | Status: DC | PRN
  Administered 2019-12-17: 12.5 ug via INTRAVENOUS

## 2019-12-17 MED ORDER — HYDRALAZINE HCL 20 MG/ML IJ SOLN: 10.0000 mg | INTRAMUSCULAR | Status: AC | PRN

## 2019-12-17 MED ORDER — HEPARIN (PORCINE) IN NACL 1000-0.9 UT/500ML-% IV SOLN
INTRAVENOUS | Status: DC | PRN
  Administered 2019-12-17: 500 mL

## 2019-12-17 MED ORDER — SODIUM CHLORIDE 0.9% FLUSH
3.0000 mL | Freq: Two times a day (BID) | INTRAVENOUS | Status: DC
  Administered 2019-12-17 – 2019-12-19 (×4): 3 mL via INTRAVENOUS

## 2019-12-17 MED ORDER — LABETALOL HCL 5 MG/ML IV SOLN: 10.0000 mg | INTRAVENOUS | Status: AC | PRN

## 2019-12-17 MED ORDER — VERAPAMIL HCL 2.5 MG/ML IV SOLN
INTRAVENOUS | Status: AC
  Filled 2019-12-17: qty 2

## 2019-12-17 MED ORDER — ENOXAPARIN SODIUM 40 MG/0.4ML ~~LOC~~ SOLN
40.0000 mg | SUBCUTANEOUS | Status: DC
  Administered 2019-12-18: 40 mg via SUBCUTANEOUS
  Filled 2019-12-17: qty 0.4

## 2019-12-17 MED ORDER — FENTANYL CITRATE (PF) 100 MCG/2ML IJ SOLN
INTRAMUSCULAR | Status: AC
  Filled 2019-12-17: qty 2

## 2019-12-17 MED ORDER — IOHEXOL 300 MG/ML  SOLN
INTRAMUSCULAR | Status: DC | PRN
  Administered 2019-12-17: 14:00:00 100 mL

## 2019-12-17 MED ORDER — SODIUM CHLORIDE 0.9% FLUSH: 3.0000 mL | INTRAVENOUS | Status: DC | PRN

## 2019-12-17 MED ORDER — ISOSORBIDE MONONITRATE ER 30 MG PO TB24
15.0000 mg | ORAL_TABLET | Freq: Every day | ORAL | Status: DC
  Administered 2019-12-17 – 2019-12-18 (×2): 15 mg via ORAL
  Filled 2019-12-17 (×2): qty 1

## 2019-12-17 MED ORDER — ASPIRIN 81 MG PO CHEW
CHEWABLE_TABLET | ORAL | Status: AC
  Administered 2019-12-17: 06:00:00 81 mg
  Filled 2019-12-17: qty 1

## 2019-12-17 SURGICAL SUPPLY — 10 items
CATH INFINITI 5 FR IM (CATHETERS) ×2 IMPLANT
CATH INFINITI 5 FR JL3.5 (CATHETERS) ×2 IMPLANT
CATH INFINITI 5 FR MPA2 (CATHETERS) ×2 IMPLANT
CATH INFINITI 5FR ANG PIGTAIL (CATHETERS) ×2 IMPLANT
DEVICE RAD TR BAND REGULAR (VASCULAR PRODUCTS) ×2 IMPLANT
GLIDESHEATH SLEND SS 6F .021 (SHEATH) ×2 IMPLANT
KIT MANI 3VAL PERCEP (MISCELLANEOUS) ×3 IMPLANT
PACK CARDIAC CATH (CUSTOM PROCEDURE TRAY) ×3 IMPLANT
WIRE HITORQ VERSACORE ST 145CM (WIRE) ×2 IMPLANT
WIRE ROSEN-J .035X260CM (WIRE) ×2 IMPLANT

## 2019-12-17 NOTE — Consult Note (Signed)
ANTICOAGULATION CONSULT NOTE - Initial Consult  Pharmacy Consult for heparin drip Indication: ACS/STEMI  No Known Allergies  Patient Measurements: Height: 5\' 7"  (170.2 cm) Weight: 208 lb 1.6 oz (94.4 kg) IBW/kg (Calculated) : 66.1 Heparin Dosing Weight: 87kg  Vital Signs: Temp: 97.7 F (36.5 C) (01/25 2000) Temp Source: Oral (01/25 1959) BP: 164/80 (01/25 1959) Pulse Rate: 73 (01/25 1959)  Labs: Recent Labs    12/16/19 1338 12/16/19 1338 12/16/19 1640 12/16/19 1920 12/16/19 2105 12/16/19 2254  HGB 13.9  --   --   --   --   --   HCT 40.9  --   --   --   --   --   PLT 195  --   --   --   --   --   APTT 27  --   --   --   --   --   LABPROT 12.2  --   --   --   --   --   INR 0.9  --   --   --   --   --   HEPARINUNFRC  --   --   --   --   --  0.77*  CREATININE 1.07  --   --   --   --   --   TROPONINIHS 477*   < > 588* 542* 539*  --    < > = values in this interval not displayed.    Estimated Creatinine Clearance: 48.2 mL/min (by C-G formula based on SCr of 1.07 mg/dL).   Medical History: Past Medical History:  Diagnosis Date  . CAD (coronary artery disease)    a. s/p CABG;  b. 07/2012 Cath: 3VD including 80 dLCX (med Rx), 4/4 patent grafts.  . Chronic diastolic CHF (congestive heart failure) ([place])    a. 07/2012 Echo: EF 55-60%, no rwma, Gr 1 DD, mild MR;  04/2014 Echo: EF 55-60%, no rwma, mild MR, mildly dil LA.  . Diabetes mellitus, type 2 ([place])   . HTN (hypertension)   . Hyperlipidemia   . Hypothyroidism   . Lower extremity edema    a. 03/2014 amlodipine d/c'd.  Marland Kitchen Spinal stenosis     Medications:  No PTA antigoagulation of record  Assessment: 86 yo male with recent hx of chest pain, troponins trending up 450>477.  Goal of Therapy:  Heparin level 0.3-0.7 units/ml Monitor platelets by anticoagulation protocol: Yes   Plan:  0125 @ 2330 HL 0.77 supratherapeutic. Will decrease rate to 1050 units/hr and will recheck HL @ 0800.  Tobie Lords, PharmD,  BCPS Clinical Pharmacist 12/17/2019 12:23 AM

## 2019-12-17 NOTE — Progress Notes (Signed)
Patient's in a 2:1 heart block, rate is in the 50's. Notified Ouma NP, gave orders to hold all beta blockers.

## 2019-12-17 NOTE — Op Note (Signed)
BRIEF CARDIAC CATHETERIZATION NOTE  12/17/2019  2:12 PM  PATIENT:  Joseph [name]  86 y.o. male  PRE-OPERATIVE DIAGNOSIS:  Non-ST segment myocardial infarction  POST-OPERATIVE DIAGNOSIS: Same  PROCEDURE:  Procedure(s): LEFT HEART CATH AND CORS/GRAFTS ANGIOGRAPHY (N/A)  SURGEON:  Surgeon(s) and Role:    Nelva Bush, MD - Primary  FINDINGS: 1. Severe native CAD, including 80% distal LMCA, 100% proximal LAD, and severe diffuse LCx disease.  90% mid RCA stenosis and CTO of rPDA also present. 2. Patent LIMA->diagonal. 3. Patent SVG-diagonal, SVG-OM1-OM2, and SVG-rPDA-rPL. 4. Mildly to moderately elevated LVEDP. 5. At least moderately reduced LVE.  RECOMMENDATIONS: 1. Medical therapy; recommend up to 12 months of DAPT with ASA and clopidogrel, as tolerated. 2. Aggressive secondary prevention of CAD. 3. Obtain TTE. 4. Monitor on telemetry.  Consider EP consultation of AV block persists, particularly if the patient is symptoms.  Nelva Bush, MD [facility]

## 2019-12-17 NOTE — H&P (View-Only) (Signed)
Progress Note  Patient Name: Joseph [name] Date of Encounter: 12/17/2019  Primary Cardiologist: Rockey Situ  Subjective   No further chest pain or dysarthria. HS-Tn peaked at 588 and is now down trending. Not to be in Mobitz type I AV block without dizziness, presyncope, or syncope. PTA Toprol XL has been held. He is for [facility] today. Remains on heparin gtt.   Inpatient Medications    Scheduled Meds: . vitamin C  500 mg Oral QHS  . aspirin EC  81 mg Oral Daily  . atorvastatin  40 mg Oral QPM  . cholecalciferol  2,000 Units Oral QPM  . insulin aspart  0-5 Units Subcutaneous QHS  . insulin aspart  0-9 Units Subcutaneous TID WC  . insulin glargine  20 Units Subcutaneous QHS  . levothyroxine  25 mcg Oral QAC breakfast  . losartan  100 mg Oral QHS  . pantoprazole  40 mg Oral Daily  . sodium chloride flush  3 mL Intravenous Once  . sodium chloride flush  3 mL Intravenous Q12H   Continuous Infusions: . sodium chloride    . sodium chloride 1 mL/kg/hr (12/17/19 0454)  . heparin 1,050 Units/hr (12/17/19 0106)   PRN Meds: sodium chloride, acetaminophen, nitroGLYCERIN, nitroGLYCERIN, ondansetron (ZOFRAN) IV, sodium chloride flush, torsemide   Vital Signs    Vitals:   12/16/19 1959 12/16/19 2000 12/17/19 0557 12/17/19 0735  BP: (!) 164/80  122/64 (!) 150/64  Pulse: 73  76 73  Resp:   16 18  Temp: 97.7 F (36.5 C) 97.7 F (36.5 C) 98.7 F (37.1 C) 97.7 F (36.5 C)  TempSrc: Oral  Oral Oral  SpO2: 98%  97% 98%  Weight:      Height:        Intake/Output Summary (Last 24 hours) at 12/17/2019 0750 Last data filed at 12/17/2019 0300 Gross per 24 hour  Intake 395.12 ml  Output 150 ml  Net 245.12 ml   Filed Weights   12/16/19 1330 12/16/19 1713  Weight: 97.3 kg 94.4 kg    Telemetry    Mobitz type I AV block - Personally Reviewed  ECG    No new tracings - Personally Reviewed  Physical Exam   GEN: No acute distress.   Neck: No JVD. Cardiac: Bradycardic, irregular,  no murmurs, rubs, or gallops.  Respiratory: Clear to auscultation bilaterally.  GI: Soft, nontender, non-distended.   MS: No edema; No deformity. Neuro:  Alert and oriented x 3; Nonfocal.  Psych: Normal affect.  Labs    Chemistry Recent Labs  Lab 12/16/19 1338  NA 138  K 3.6  CL 103  CO2 27  GLUCOSE 172*  BUN 17  CREATININE 1.07  CALCIUM 8.9  PROT 6.8  ALBUMIN 3.4*  AST 24  ALT 21  ALKPHOS 47  BILITOT 0.6  GFRNONAA 60*  GFRAA >60  ANIONGAP 8     Hematology Recent Labs  Lab 12/16/19 1338  WBC 8.6  RBC 4.57  HGB 13.9  HCT 40.9  MCV 89.5  MCH 30.4  MCHC 34.0  RDW 13.4  PLT 195    Cardiac EnzymesNo results for input(s): TROPONINI in the last 168 hours. No results for input(s): TROPIPOC in the last 168 hours.   BNPNo results for input(s): BNP, PROBNP in the last 168 hours.   DDimer No results for input(s): DDIMER in the last 168 hours.   Radiology    DG Chest 2 View  Result Date: 12/16/2019 IMPRESSION: No acute findings. Electronically Signed  By: Lorin Picket M.D.   On: 12/16/2019 14:02   CT Head Wo Contrast  Result Date: 12/16/2019 IMPRESSION: No acute or focal CT finding.  Age related atrophy. Electronically Signed   By: Nelson Chimes M.D.   On: 12/16/2019 15:54    Cardiac Studies   2D Echo 03/2016: - Left ventricle: The cavity size was normal. There was mild   concentric hypertrophy. Systolic function was normal. The   estimated ejection fraction was in the range of 50% to 55%.   Septal wall motion consistent with post-operative state. Left   ventricular diastolic function parameters were normal. - Mitral valve: There was mild regurgitation. - Left atrium: The atrium was mildly dilated. - Right ventricle: Systolic function was normal. - Pulmonary arteries: Systolic pressure was within the normal   range.  Patient Profile     86 y.o. male with history of CAD s/p CABG in 1994, PAD, HFpEF, possible TIA, DM2, HTN, HLD, hypothyroidism, and  obesity who we are seeing for NSTEMI.   Assessment & Plan    1. CAD s/p CABG with NSTEMI: -Patent developed severe chest pressure overnight on 1/21 which lasted for 30-45 and recurred nightly until 1/24. There was associated dyspnea as well as dysarthria, though the latter occurred only once on 1/21. Symptoms have been nonexertional and he has been pain free since. Symptoms do not feel similar to his presentation leading up to his CABG -He is for LHC today -Heparin gtt -ASA -Risks and benefits of cardiac catheterization have been discussed with the patient including risks of bleeding, bruising, infection, kidney damage, stroke, heart attack, injury to a limb, urgent need for bypass and death. The patient understands these risks and is willing to proceed with the procedure. All questions have been answered and concerns listened to  2. Mobitz type I AV block: -Asymptomatic  -Hold PTA Toprol  -Check TSH -Potassium low normal -Check magnesium -Plan for Zio patch at discharge  3. Dysarthria: -Possible prior TIA per notes -CT head not acute -Per IM  4. HFpEF: -He does not appear grossly volume up -Has been managed with PRN torsemide at home -Losartan   5. HTN: -BP is mildly elevated this morning -Titrate antihypertensive as indicated following LHC  6. HLD: -Lipitor -LDL of 55 from 04/2018 -Check FLP  For questions or updates, please contact [facility] Please consult [email or web address] for contact info under Cardiology/STEMI.    Signed, Christell Faith, PA-C [facility] Pager: [phone] 12/17/2019, 7:50 AM

## 2019-12-17 NOTE — Consult Note (Signed)
ANTICOAGULATION CONSULT NOTE  Pharmacy Consult for heparin drip Indication: ACS/STEMI  No Known Allergies  Patient Measurements: Height: 5\' 7"  (170.2 cm) Weight: 208 lb 1.6 oz (94.4 kg) IBW/kg (Calculated) : 66.1 Heparin Dosing Weight: 87kg  Vital Signs: Temp: 97.7 F (36.5 C) (01/26 0735) Temp Source: Oral (01/26 0735) BP: 150/64 (01/26 0735) Pulse Rate: 61 (01/26 0735)  Labs: Recent Labs    12/16/19 1338 12/16/19 1338 12/16/19 1640 12/16/19 1920 12/16/19 2105 12/16/19 2254 12/17/19 0749  HGB 13.9  --   --   --   --   --   --   HCT 40.9  --   --   --   --   --   --   PLT 195  --   --   --   --   --   --   APTT 27  --   --   --   --   --   --   LABPROT 12.2  --   --   --   --   --   --   INR 0.9  --   --   --   --   --   --   HEPARINUNFRC  --   --   --   --   --  0.77* 0.72*  CREATININE 1.07  --   --   --   --   --   --   TROPONINIHS 477*   < > 588* 542* 539*  --   --    < > = values in this interval not displayed.    Estimated Creatinine Clearance: 48.2 mL/min (by C-G formula based on SCr of 1.07 mg/dL).   Medical History: Past Medical History:  Diagnosis Date  . CAD (coronary artery disease)    a. s/p CABG;  b. 07/2012 Cath: 3VD including 80 dLCX (med Rx), 4/4 patent grafts.  . Chronic diastolic CHF (congestive heart failure) ([place])    a. 07/2012 Echo: EF 55-60%, no rwma, Gr 1 DD, mild MR;  04/2014 Echo: EF 55-60%, no rwma, mild MR, mildly dil LA.  . Diabetes mellitus, type 2 ([place])   . HTN (hypertension)   . Hyperlipidemia   . Hypothyroidism   . Lower extremity edema    a. 03/2014 amlodipine d/c'd.  Marland Kitchen Spinal stenosis     Medications:  No PTA antigoagulation of record  Assessment: 86 yo male with recent hx of chest pain, troponins trending up 450>477.  Goal of Therapy:  Heparin level 0.3-0.7 units/ml Monitor platelets by anticoagulation protocol: Yes   Plan:  1/26 0749 HL 0.72 slightly supratherapeutic. Decrease heparin drip to 1000 units/hr. HL at  1800 if continues on heparin. Plan for cath with possible PCI today per Cardiology. Will f/u plan post-cath.  Tawnya Crook, PharmD Clinical Pharmacist 12/17/2019 10:26 AM

## 2019-12-17 NOTE — Progress Notes (Addendum)
PROGRESS NOTE                                                                                                                                                                                                             Patient Demographics:    Joseph [name], is a 86 y.o. male, DOB - 1925/05/09, CT:[id number]  Admit date - 12/16/2019   Admitting Physician [name], MD  Outpatient Primary MD for the patient is Joseph [name]., MD  LOS - 1    Chief Complaint  Patient presents with  . Chest Pain       Brief Narrative 86 year old male with CAD s/p CABG, chronic diastolic CHF, type 2 diabetes mellitus on insulin, hyperlipidemia, peripheral arterial disease sent to the ED from his cardiologist (Dr. Rockey Situ) office after having chest pain, radiating to the jaw and back of the arm.  EKG showed normal sinus rhythm.  Troponin was elevated at 450. Patient admitted for NSTEMI. Cardiac cath done on 1/26 showing severe native CAD with 80% distal LMCA 100% proximal LAD and severe diffuse left circumflex disease, 90% mid RCA stenosis.    Subjective:   Patient seen and examined this morning.  No further chest pain symptoms.   Assessment  & Plan :   Principal problem Non-ST elevation MI ([place]) Severe native CAD with proximal LAD, diffuse left circumflex, RCA stenosis. Recommend medical therapy with at least 12 months of DAPT (aspirin and Plavix).  Follow 2D echo.  Continue beta-blocker and statin. Cardiology recommends telemetry monitoring and EP consultation if patient symptomatic and persistent AV block.   Active Problems: Essential hypertension Stable.  Continue beta-blocker and losartan    Type 2 diabetes mellitus with diabetic peripheral angiopathy without gangrene, with long-term current use of insulin (HCC) Resume home dose Lantus and sliding scale coverage  Chronic diastolic CHF Euvolemic.  Continue home meds   Hypothyroidism Continue Synthroid   Code Status : Full code  Family Communication  : Wife at bedside  Disposition Plan  : Home possibly tomorrow if stable post cath and no arrhythmia on telemetry overnight   Barriers For Discharge : Active symptoms/NSTEMI  Consults  : Cardiology  Procedures  : Left heart catheterization/2D echo  DVT Prophylaxis  : Subcu Lovenox  Lab Results  Component Value Date   PLT 195 12/16/2019  Antibiotics  :    Anti-infectives (From admission, onward)   None        Objective:   Vitals:   12/17/19 0557 12/17/19 0735 12/17/19 1219 12/17/19 1319  BP: 122/64 (!) 150/64 (!) 158/67   Pulse: 76 61 (!) 40   Resp: 16 18 18    Temp: 98.7 F (37.1 C) 97.7 F (36.5 C)    TempSrc: Oral Oral    SpO2: 97% 98% 96% 99%  Weight:   92.5 kg   Height:   5\' 7"  (1.702 m)     Wt Readings from Last 3 Encounters:  12/17/19 92.5 kg  12/16/19 97.3 kg  11/12/19 96.2 kg     Intake/Output Summary (Last 24 hours) at 12/17/2019 1424 Last data filed at 12/17/2019 0300 Gross per 24 hour  Intake 395.12 ml  Output 150 ml  Net 245.12 ml     Physical Exam  Gen: not in distress HEENT:moist mucosa, supple neck Chest: clear b/l, no added sounds CVS: N S1&S2, no murmurs GI: soft, NT, ND,  Musculoskeletal: warm, no edema     Data Review:    CBC Recent Labs  Lab 12/16/19 1338  WBC 8.6  HGB 13.9  HCT 40.9  PLT 195  MCV 89.5  MCH 30.4  MCHC 34.0  RDW 13.4  LYMPHSABS 2.6  MONOABS 0.6  EOSABS 1.1*  BASOSABS 0.1    Chemistries  Recent Labs  Lab 12/16/19 1338 12/17/19 0749  NA 138  --   K 3.6  --   CL 103  --   CO2 27  --   GLUCOSE 172*  --   BUN 17  --   CREATININE 1.07  --   CALCIUM 8.9  --   MG  --  1.6*  AST 24  --   ALT 21  --   ALKPHOS 47  --   BILITOT 0.6  --    ------------------------------------------------------------------------------------------------------------------ Recent Labs    12/17/19 0749  CHOL 121   HDL 34*  LDLCALC 67  TRIG 98  CHOLHDL 3.6    Lab Results  Component Value Date   HGBA1C 8.9 (H) 12/16/2019   ------------------------------------------------------------------------------------------------------------------ Recent Labs    12/17/19 0749  TSH 3.306   ------------------------------------------------------------------------------------------------------------------ No results for input(s): VITAMINB12, FOLATE, FERRITIN, TIBC, IRON, RETICCTPCT in the last 72 hours.  Coagulation profile Recent Labs  Lab 12/16/19 1338  INR 0.9    No results for input(s): DDIMER in the last 72 hours.  Cardiac Enzymes No results for input(s): CKMB, TROPONINI, MYOGLOBIN in the last 168 hours.  Invalid input(s): CK ------------------------------------------------------------------------------------------------------------------ No results found for: BNP  Inpatient Medications  Scheduled Meds: . [MAR Hold] vitamin C  500 mg Oral QHS  . [MAR Hold] aspirin EC  81 mg Oral Daily  . [MAR Hold] atorvastatin  40 mg Oral QPM  . [MAR Hold] cholecalciferol  2,000 Units Oral QPM  . [MAR Hold] insulin aspart  0-5 Units Subcutaneous QHS  . [MAR Hold] insulin aspart  0-9 Units Subcutaneous TID WC  . [MAR Hold] insulin glargine  20 Units Subcutaneous QHS  . [MAR Hold] levothyroxine  25 mcg Oral QAC breakfast  . [MAR Hold] losartan  100 mg Oral QHS  . [MAR Hold] pantoprazole  40 mg Oral Daily  . [MAR Hold] sodium chloride flush  3 mL Intravenous Once  . sodium chloride flush  3 mL Intravenous Q12H   Continuous Infusions: . sodium chloride    . sodium chloride 1 mL/kg/hr (12/17/19  1237)  . heparin Stopped (12/17/19 1236)   PRN Meds:.sodium chloride, [MAR Hold] acetaminophen, [MAR Hold] nitroGLYCERIN, [MAR Hold] nitroGLYCERIN, [MAR Hold] ondansetron (ZOFRAN) IV, sodium chloride flush, [MAR Hold] torsemide  Micro Results Recent Results (from the past 240 hour(s))  SARS CORONAVIRUS 2 (TAT  6-24 HRS) Nasopharyngeal Nasopharyngeal Swab     Status: None   Collection Time: 12/16/19  7:07 PM   Specimen: Nasopharyngeal Swab  Result Value Ref Range Status   SARS Coronavirus 2 NEGATIVE NEGATIVE Final    Comment: (NOTE) SARS-CoV-2 target nucleic acids are NOT DETECTED. The SARS-CoV-2 RNA is generally detectable in upper and lower respiratory specimens during the acute phase of infection. Negative results do not preclude SARS-CoV-2 infection, do not rule out co-infections with other pathogens, and should not be used as the sole basis for treatment or other patient management decisions. Negative results must be combined with clinical observations, patient history, and epidemiological information. The expected result is Negative. Fact Sheet for Patients: SugarRoll.be Fact Sheet for Healthcare Providers: https://www.woods-mathews.com/ This test is not yet approved or cleared by the [place] FDA and  has been authorized for detection and/or diagnosis of SARS-CoV-2 by FDA under an Emergency Use Authorization (EUA). This EUA will remain  in effect (meaning this test can be used) for the duration of the COVID-19 declaration under Section 56 4(b)(1) of the Act, 21 U.S.C. section 360bbb-3(b)(1), unless the authorization is terminated or revoked sooner. Performed at [facility], [place] [address], [place], [place] [address]     Radiology Reports DG Chest 2 View  Result Date: 12/16/2019 CLINICAL DATA:  Elevated troponins. EXAM: CHEST - 2 VIEW COMPARISON:  03/22/2016 and CT chest 03/21/2016. FINDINGS: Trachea is midline. Heart size normal. Thoracic aorta is calcified. Lungs are clear. No pleural fluid. Flowing anterior osteophytosis in the thoracic spine. IMPRESSION: No acute findings. Electronically Signed   By: Lorin Picket M.D.   On: 12/16/2019 14:02   CT Head Wo Contrast  Result Date: 12/16/2019 CLINICAL DATA:  Question  intracranial hemorrhage. EXAM: CT HEAD WITHOUT CONTRAST TECHNIQUE: Contiguous axial images were obtained from the base of the skull through the vertex without intravenous contrast. COMPARISON:  02/03/2017 FINDINGS: Brain: Ordinary age related volume loss. No evidence of old or acute focal infarction, mass lesion, hemorrhage, hydrocephalus or extra-axial collection. Vascular: There is atherosclerotic calcification of the major vessels at the base of the brain. Skull: Negative Sinuses/Orbits: Clear/normal Other: None IMPRESSION: No acute or focal CT finding.  Age related atrophy. Electronically Signed   By: Nelson Chimes M.D.   On: 12/16/2019 15:54    Time Spent in minutes 25   Valencia Kassa M.D on 12/17/2019 at 2:24 PM  Between 7am to 7pm - Pager - [phone]  After 7pm go to [email or web address] - password [facility]  [facility] -  Office  [phone]

## 2019-12-17 NOTE — Interval H&P Note (Signed)
History and Physical Interval Note:  12/17/2019 1:08 PM  Joseph [name]  has presented today for surgery, with the diagnosis of Non-ST segment myocardial infarction.  The various methods of treatment have been discussed with the patient and family. After consideration of risks, benefits and other options for treatment, the patient has consented to  Procedure(s): LEFT HEART CATH AND CORS/GRAFTS ANGIOGRAPHY (N/A) as a surgical intervention.  The patient's history has been reviewed, patient examined, no change in status, stable for surgery.  I have reviewed the patient's chart and labs.  Questions were answered to the patient's satisfaction.    Cath Lab Visit (complete for each Cath Lab visit)  Clinical Evaluation Leading to the Procedure:   ACS: Yes.    Non-ACS:  N/A   Hadeel Hillebrand

## 2019-12-17 NOTE — Progress Notes (Signed)
Progress Note  Patient Name: Joseph [name] Date of Encounter: 12/17/2019  Primary Cardiologist: Rockey Situ  Subjective   No further chest pain or dysarthria. HS-Tn peaked at 588 and is now down trending. Not to be in Mobitz type I AV block without dizziness, presyncope, or syncope. PTA Toprol XL has been held. He is for [facility] today. Remains on heparin gtt.   Inpatient Medications    Scheduled Meds: . vitamin C  500 mg Oral QHS  . aspirin EC  81 mg Oral Daily  . atorvastatin  40 mg Oral QPM  . cholecalciferol  2,000 Units Oral QPM  . insulin aspart  0-5 Units Subcutaneous QHS  . insulin aspart  0-9 Units Subcutaneous TID WC  . insulin glargine  20 Units Subcutaneous QHS  . levothyroxine  25 mcg Oral QAC breakfast  . losartan  100 mg Oral QHS  . pantoprazole  40 mg Oral Daily  . sodium chloride flush  3 mL Intravenous Once  . sodium chloride flush  3 mL Intravenous Q12H   Continuous Infusions: . sodium chloride    . sodium chloride 1 mL/kg/hr (12/17/19 0454)  . heparin 1,050 Units/hr (12/17/19 0106)   PRN Meds: sodium chloride, acetaminophen, nitroGLYCERIN, nitroGLYCERIN, ondansetron (ZOFRAN) IV, sodium chloride flush, torsemide   Vital Signs    Vitals:   12/16/19 1959 12/16/19 2000 12/17/19 0557 12/17/19 0735  BP: (!) 164/80  122/64 (!) 150/64  Pulse: 73  76 73  Resp:   16 18  Temp: 97.7 F (36.5 C) 97.7 F (36.5 C) 98.7 F (37.1 C) 97.7 F (36.5 C)  TempSrc: Oral  Oral Oral  SpO2: 98%  97% 98%  Weight:      Height:        Intake/Output Summary (Last 24 hours) at 12/17/2019 0750 Last data filed at 12/17/2019 0300 Gross per 24 hour  Intake 395.12 ml  Output 150 ml  Net 245.12 ml   Filed Weights   12/16/19 1330 12/16/19 1713  Weight: 97.3 kg 94.4 kg    Telemetry    Mobitz type I AV block - Personally Reviewed  ECG    No new tracings - Personally Reviewed  Physical Exam   GEN: No acute distress.   Neck: No JVD. Cardiac: Bradycardic, irregular,  no murmurs, rubs, or gallops.  Respiratory: Clear to auscultation bilaterally.  GI: Soft, nontender, non-distended.   MS: No edema; No deformity. Neuro:  Alert and oriented x 3; Nonfocal.  Psych: Normal affect.  Labs    Chemistry Recent Labs  Lab 12/16/19 1338  NA 138  K 3.6  CL 103  CO2 27  GLUCOSE 172*  BUN 17  CREATININE 1.07  CALCIUM 8.9  PROT 6.8  ALBUMIN 3.4*  AST 24  ALT 21  ALKPHOS 47  BILITOT 0.6  GFRNONAA 60*  GFRAA >60  ANIONGAP 8     Hematology Recent Labs  Lab 12/16/19 1338  WBC 8.6  RBC 4.57  HGB 13.9  HCT 40.9  MCV 89.5  MCH 30.4  MCHC 34.0  RDW 13.4  PLT 195    Cardiac EnzymesNo results for input(s): TROPONINI in the last 168 hours. No results for input(s): TROPIPOC in the last 168 hours.   BNPNo results for input(s): BNP, PROBNP in the last 168 hours.   DDimer No results for input(s): DDIMER in the last 168 hours.   Radiology    DG Chest 2 View  Result Date: 12/16/2019 IMPRESSION: No acute findings. Electronically Signed  By: Lorin Picket M.D.   On: 12/16/2019 14:02   CT Head Wo Contrast  Result Date: 12/16/2019 IMPRESSION: No acute or focal CT finding.  Age related atrophy. Electronically Signed   By: Nelson Chimes M.D.   On: 12/16/2019 15:54    Cardiac Studies   2D Echo 03/2016: - Left ventricle: The cavity size was normal. There was mild   concentric hypertrophy. Systolic function was normal. The   estimated ejection fraction was in the range of 50% to 55%.   Septal wall motion consistent with post-operative state. Left   ventricular diastolic function parameters were normal. - Mitral valve: There was mild regurgitation. - Left atrium: The atrium was mildly dilated. - Right ventricle: Systolic function was normal. - Pulmonary arteries: Systolic pressure was within the normal   range.  Patient Profile     86 y.o. male with history of CAD s/p CABG in 1994, PAD, HFpEF, possible TIA, DM2, HTN, HLD, hypothyroidism, and  obesity who we are seeing for NSTEMI.   Assessment & Plan    1. CAD s/p CABG with NSTEMI: -Patent developed severe chest pressure overnight on 1/21 which lasted for 30-45 and recurred nightly until 1/24. There was associated dyspnea as well as dysarthria, though the latter occurred only once on 1/21. Symptoms have been nonexertional and he has been pain free since. Symptoms do not feel similar to his presentation leading up to his CABG -He is for LHC today -Heparin gtt -ASA -Risks and benefits of cardiac catheterization have been discussed with the patient including risks of bleeding, bruising, infection, kidney damage, stroke, heart attack, injury to a limb, urgent need for bypass and death. The patient understands these risks and is willing to proceed with the procedure. All questions have been answered and concerns listened to  2. Mobitz type I AV block: -Asymptomatic  -Hold PTA Toprol  -Check TSH -Potassium low normal -Check magnesium -Plan for Zio patch at discharge  3. Dysarthria: -Possible prior TIA per notes -CT head not acute -Per IM  4. HFpEF: -He does not appear grossly volume up -Has been managed with PRN torsemide at home -Losartan   5. HTN: -BP is mildly elevated this morning -Titrate antihypertensive as indicated following LHC  6. HLD: -Lipitor -LDL of 55 from 04/2018 -Check FLP  For questions or updates, please contact [facility] Please consult [email or web address] for contact info under Cardiology/STEMI.    Signed, Christell Faith, PA-C [facility] Pager: [phone] 12/17/2019, 7:50 AM

## 2019-12-18 ENCOUNTER — Ambulatory Visit (INDEPENDENT_AMBULATORY_CARE_PROVIDER_SITE_OTHER): Payer: PPO

## 2019-12-18 ENCOUNTER — Telehealth: Payer: Self-pay

## 2019-12-18 ENCOUNTER — Ambulatory Visit: Payer: PPO | Admitting: Family Medicine

## 2019-12-18 ENCOUNTER — Encounter: Payer: Self-pay | Admitting: Cardiology

## 2019-12-18 DIAGNOSIS — I10 Essential (primary) hypertension: Secondary | ICD-10-CM

## 2019-12-18 DIAGNOSIS — R001 Bradycardia, unspecified: Secondary | ICD-10-CM

## 2019-12-18 DIAGNOSIS — I441 Atrioventricular block, second degree: Secondary | ICD-10-CM

## 2019-12-18 LAB — CBC
HCT: 39 % (ref 39.0–52.0)
Hemoglobin: 13.2 g/dL (ref 13.0–17.0)
MCH: 30.2 pg (ref 26.0–34.0)
MCHC: 33.8 g/dL (ref 30.0–36.0)
MCV: 89.2 fL (ref 80.0–100.0)
Platelets: 170 10*3/uL (ref 150–400)
RBC: 4.37 MIL/uL (ref 4.22–5.81)
RDW: 13.3 % (ref 11.5–15.5)
WBC: 7.8 10*3/uL (ref 4.0–10.5)
nRBC: 0 % (ref 0.0–0.2)

## 2019-12-18 LAB — BASIC METABOLIC PANEL
Anion gap: 9 (ref 5–15)
BUN: 15 mg/dL (ref 8–23)
CO2: 24 mmol/L (ref 22–32)
Calcium: 8.4 mg/dL — ABNORMAL LOW (ref 8.9–10.3)
Chloride: 106 mmol/L (ref 98–111)
Creatinine, Ser: 1.06 mg/dL (ref 0.61–1.24)
GFR calc Af Amer: >60 mL/min (ref >60)
GFR calc non Af Amer: >60 mL/min (ref >60)
Glucose, Bld: 144 mg/dL — ABNORMAL HIGH (ref 70–99)
Potassium: 3.5 mmol/L (ref 3.5–5.1)
Sodium: 139 mmol/L (ref 135–145)

## 2019-12-18 LAB — ECHOCARDIOGRAM COMPLETE
Height: 67 in
Weight: 3264 oz

## 2019-12-18 LAB — GLUCOSE, CAPILLARY
Glucose-Capillary: 137 mg/dL — ABNORMAL HIGH (ref 70–99)
Glucose-Capillary: 219 mg/dL — ABNORMAL HIGH (ref 70–99)
Glucose-Capillary: 183 mg/dL — ABNORMAL HIGH (ref 70–99)
Glucose-Capillary: 190 mg/dL — ABNORMAL HIGH (ref 70–99)

## 2019-12-18 LAB — MAGNESIUM: Magnesium: 1.5 mg/dL — ABNORMAL LOW (ref 1.7–2.4)

## 2019-12-18 LAB — SARS CORONAVIRUS 2 (TAT 6-24 HRS): SARS Coronavirus 2: NEGATIVE

## 2019-12-18 MED ORDER — POTASSIUM CHLORIDE CRYS ER 20 MEQ PO TBCR
40.0000 meq | EXTENDED_RELEASE_TABLET | Freq: Once | ORAL | Status: AC
  Administered 2019-12-18: 10:00:00 40 meq via ORAL
  Filled 2019-12-18: qty 2

## 2019-12-18 MED ORDER — CLOPIDOGREL BISULFATE 75 MG PO TABS
75.0000 mg | ORAL_TABLET | Freq: Every day | ORAL | Status: DC
  Administered 2019-12-18 – 2019-12-19 (×2): 75 mg via ORAL
  Filled 2019-12-18 (×2): qty 1

## 2019-12-18 MED ORDER — MAGNESIUM SULFATE 2 GM/50ML IV SOLN
2.0000 g | Freq: Once | INTRAVENOUS | Status: AC
  Administered 2019-12-18: 10:00:00 2 g via INTRAVENOUS
  Filled 2019-12-18: qty 50

## 2019-12-18 MED ORDER — TRAMADOL HCL 50 MG PO TABS
25.0000 mg | ORAL_TABLET | Freq: Four times a day (QID) | ORAL | Status: DC | PRN
  Administered 2019-12-18: 13:00:00 25 mg via ORAL
  Filled 2019-12-18: qty 1

## 2019-12-18 NOTE — Addendum Note (Signed)
Addended by: Verlon Au on: 12/18/2019 01:55 PM   Modules accepted: Orders

## 2019-12-18 NOTE — Telephone Encounter (Signed)
-----   Message from Rise Mu, PA-C sent at 12/17/2019  2:26 PM EST ----- Marykay Lex, for Mr. Knauth, did we do a "real-time" monitor? If not, can we still do that?

## 2019-12-18 NOTE — Telephone Encounter (Signed)
duplicate

## 2019-12-18 NOTE — Telephone Encounter (Signed)
Request per Christell Faith, PA to place monitor on patient. Zio AT preferred for heart block.   Went to patient hospital bed and placed.  Ref number [id number].  Order placed and registered with zio suite.

## 2019-12-18 NOTE — Progress Notes (Addendum)
PROGRESS NOTE                                                                                                                                                                                                             Patient Demographics:    Joseph [name], is a 86 y.o. male, DOB - 1925/07/28, [id number]  Admit date - 12/16/2019   Admitting Physician [name], MD  Outpatient Primary MD for the patient is Joseph [name]., MD  LOS - 2  Chief Complaint  Patient presents with  . Chest Pain      Brief Narrative 86 year old male with CAD s/p CABG, chronic diastolic CHF, type 2 diabetes mellitus on insulin, hyperlipidemia, peripheral arterial disease sent to the ED from his cardiologist (Dr. Rockey Situ) office after having chest pain, radiating to the jaw and back of the arm.  EKG showed normal sinus rhythm.  Troponin was elevated at 450. Patient admitted for NSTEMI. Cardiac cath done on 1/26 showing severe native CAD with 80% distal LMCA 100% proximal LAD and severe diffuse left circumflex disease, 90% mid RCA stenosis.   Subjective:   Patient appears stable this morning denies having any chest pain.  However has had a couple of episodes of headache which at a time was not resolved with Tylenol.  Denies any focal neurological deficit.  Patient also complained of some chills.   Assessment  & Plan :   Principal problem Non-ST elevation MI ([place]) -Denies active chest pain at this point Status post left heart catheterization: severe native CAD with proximal LAD, diffuse left circumflex, RCA stenosis. Recommend medical therapy with at least 12 months of DAPT (aspirin and Plavix). --Continue aspirin and Plavix -  Follow 2D echo:  1. Left ventricular ejection fraction, by visual estimation, is 50 to 55%. The left ventricle has low normal function. There is mildly increased left ventricular hypertrophy.  2. Moderate  hypokinesis of the left ventricular, mid anteroseptal wall.  3. Left ventricular diastolic parameters are consistent with Grade I diastolic dysfunction (impaired relaxation).  -  Continue beta-blocker and statin. - Zio monitor delivered and placed per cardiology for persistent AV block. -Monitor for now  Headache: Along with some chills and body ache mild -Intermittent but resistant to Tylenol -Discontinued Imdur as it is likely to be causing it -As needed pain management -If  recurs, may check CT head without contrast -We will monitor patient's overnight -Also checking COVID-19 again as per patient request   Active Problems: Essential hypertension Stable.  Continue beta-blocker and losartan    Type 2 diabetes mellitus with diabetic peripheral angiopathy without gangrene, with long-term current use of insulin (HCC) Resume home dose Lantus and sliding scale coverage  Chronic diastolic CHF Euvolemic.  Continue home meds  Hypothyroidism Continue Synthroid  Debility: Consult PT for eval treat and DC recs  Code Status : Full code  Family Communication  : Wife at bedside  Disposition Plan  : Home possibly tomorrow if stable post cath and no arrhythmia on telemetry overnight   Barriers For Discharge : Active symptoms/NSTEMI  Consults  : Cardiology  Procedures  : Left heart catheterization/2D echo  DVT Prophylaxis  : Subcu Lovenox  Lab Results  Component Value Date   PLT 170 12/18/2019    Antibiotics  :    Anti-infectives (From admission, onward)   None        Objective:   Vitals:   12/18/19 0452 12/18/19 0801 12/18/19 1300 12/18/19 1521  BP: (!) 146/69 124/73 (!) 137/57 (!) 176/56  Pulse: 72 (!) 56 62 76  Resp: 20 17  17   Temp: 98.1 F (36.7 C) 98.6 F (37 C) 99.4 F (37.4 C) 99.5 F (37.5 C)  TempSrc: Oral Oral  Oral  SpO2: 100% 96% 98% 99%  Weight:      Height:        Wt Readings from Last 3 Encounters:  12/17/19 92.5 kg  12/16/19 97.3 kg    11/12/19 96.2 kg     Intake/Output Summary (Last 24 hours) at 12/18/2019 1527 Last data filed at 12/17/2019 2136 Gross per 24 hour  Intake 1433.15 ml  Output 400 ml  Net 1033.15 ml     Physical Exam  Gen: not in distress HEENT:moist mucosa, supple neck Chest: clear b/l, no added sounds CVS: N S1&S2, no murmurs GI: soft, NT, ND,  Musculoskeletal: warm, no edema     Data Review:    CBC Recent Labs  Lab 12/16/19 1338 12/18/19 0457  WBC 8.6 7.8  HGB 13.9 13.2  HCT 40.9 39.0  PLT 195 170  MCV 89.5 89.2  MCH 30.4 30.2  MCHC 34.0 33.8  RDW 13.4 13.3  LYMPHSABS 2.6  --   MONOABS 0.6  --   EOSABS 1.1*  --   BASOSABS 0.1  --     Chemistries  Recent Labs  Lab 12/16/19 1338 12/17/19 0749 12/18/19 0457  NA 138  --  139  K 3.6  --  3.5  CL 103  --  106  CO2 27  --  24  GLUCOSE 172*  --  144*  BUN 17  --  15  CREATININE 1.07  --  1.06  CALCIUM 8.9  --  8.4*  MG  --  1.6* 1.5*  AST 24  --   --   ALT 21  --   --   ALKPHOS 47  --   --   BILITOT 0.6  --   --    ------------------------------------------------------------------------------------------------------------------ Recent Labs    12/17/19 0749  CHOL 121  HDL 34*  LDLCALC 67  TRIG 98  CHOLHDL 3.6    Lab Results  Component Value Date   HGBA1C 8.9 (H) 12/16/2019   ------------------------------------------------------------------------------------------------------------------ Recent Labs    12/17/19 0749  TSH 3.306   ------------------------------------------------------------------------------------------------------------------ No results for input(s): VITAMINB12, FOLATE, FERRITIN, TIBC,  IRON, RETICCTPCT in the last 72 hours.  Coagulation profile Recent Labs  Lab 12/16/19 1338  INR 0.9    No results for input(s): DDIMER in the last 72 hours.  Cardiac Enzymes No results for input(s): CKMB, TROPONINI, MYOGLOBIN in the last 168 hours.  Invalid input(s):  CK ------------------------------------------------------------------------------------------------------------------ No results found for: BNP  Inpatient Medications  Scheduled Meds: . vitamin C  500 mg Oral QHS  . aspirin EC  81 mg Oral Daily  . atorvastatin  40 mg Oral QPM  . cholecalciferol  2,000 Units Oral QPM  . clopidogrel  75 mg Oral Daily  . enoxaparin (LOVENOX) injection  40 mg Subcutaneous Q24H  . insulin aspart  0-5 Units Subcutaneous QHS  . insulin aspart  0-9 Units Subcutaneous TID WC  . insulin glargine  20 Units Subcutaneous QHS  . levothyroxine  25 mcg Oral QAC breakfast  . losartan  100 mg Oral QHS  . pantoprazole  40 mg Oral Daily  . sodium chloride flush  3 mL Intravenous Once  . sodium chloride flush  3 mL Intravenous Q12H   Continuous Infusions: . sodium chloride     PRN Meds:.sodium chloride, acetaminophen, nitroGLYCERIN, ondansetron (ZOFRAN) IV, sodium chloride flush, traMADol  Micro Results Recent Results (from the past 240 hour(s))  SARS CORONAVIRUS 2 (TAT 6-24 HRS) Nasopharyngeal Nasopharyngeal Swab     Status: None   Collection Time: 12/16/19  7:07 PM   Specimen: Nasopharyngeal Swab  Result Value Ref Range Status   SARS Coronavirus 2 NEGATIVE NEGATIVE Final    Comment: (NOTE) SARS-CoV-2 target nucleic acids are NOT DETECTED. The SARS-CoV-2 RNA is generally detectable in upper and lower respiratory specimens during the acute phase of infection. Negative results do not preclude SARS-CoV-2 infection, do not rule out co-infections with other pathogens, and should not be used as the sole basis for treatment or other patient management decisions. Negative results must be combined with clinical observations, patient history, and epidemiological information. The expected result is Negative. Fact Sheet for Patients: SugarRoll.be Fact Sheet for Healthcare Providers: https://www.woods-mathews.com/ This test is  not yet approved or cleared by the [place] FDA and  has been authorized for detection and/or diagnosis of SARS-CoV-2 by FDA under an Emergency Use Authorization (EUA). This EUA will remain  in effect (meaning this test can be used) for the duration of the COVID-19 declaration under Section 56 4(b)(1) of the Act, 21 U.S.C. section 360bbb-3(b)(1), unless the authorization is terminated or revoked sooner. Performed at [facility], [place] [address] [place], [place] [address]     Radiology Reports DG Chest 2 View  Result Date: 12/16/2019 CLINICAL DATA:  Elevated troponins. EXAM: CHEST - 2 VIEW COMPARISON:  03/22/2016 and CT chest 03/21/2016. FINDINGS: Trachea is midline. Heart size normal. Thoracic aorta is calcified. Lungs are clear. No pleural fluid. Flowing anterior osteophytosis in the thoracic spine. IMPRESSION: No acute findings. Electronically Signed   By: Lorin Picket M.D.   On: 12/16/2019 14:02   CT Head Wo Contrast  Result Date: 12/16/2019 CLINICAL DATA:  Question intracranial hemorrhage. EXAM: CT HEAD WITHOUT CONTRAST TECHNIQUE: Contiguous axial images were obtained from the base of the skull through the vertex without intravenous contrast. COMPARISON:  02/03/2017 FINDINGS: Brain: Ordinary age related volume loss. No evidence of old or acute focal infarction, mass lesion, hemorrhage, hydrocephalus or extra-axial collection. Vascular: There is atherosclerotic calcification of the major vessels at the base of the brain. Skull: Negative Sinuses/Orbits: Clear/normal Other: None IMPRESSION: No acute  or focal CT finding.  Age related atrophy. Electronically Signed   By: Nelson Chimes M.D.   On: 12/16/2019 15:54   CARDIAC CATHETERIZATION  Result Date: 12/18/2019  The left ventricular ejection fraction is 35-45% by visual estimate.  Conclusions: 1. Severe native CAD, including 80% distal LMCA, 100% proximal LAD and diffuse LCx/OM disease.  90% mid RCA stenosis and CTO of rPDA also  present. 2. Patent LIMA->D3 with backfilling of the mid/distal LADl. 3. Patent SVG-D1-D2, SVG-OM1-OM2, and SVG-rPDA-rPL. 4. Mildly to moderately elevated LVEDP. 5. At least moderately reduced left ventricular systolic function.  Recommendations: 1. Medical therapy; recommend up to 12 months of DAPT with ASA and clopidogrel, as tolerated. 2. Aggressive secondary prevention of coronary artery disease. 3. Obtain echocardiogram to better assess LVEF.Marland Kitchen 4. Monitor on telemetry.  Consider EP consultation of AV block persists, particularly if the patient is symptoms. Nelva Bush, MD [facility]  ECHOCARDIOGRAM COMPLETE  Result Date: 12/18/2019   ECHOCARDIOGRAM REPORT   Patient Name:   NIKOLOS CORKILL Date of Exam: 12/17/2019 Medical Rec #:  [id number]         Height:       67.0 in Accession #:    [id number]        Weight:       204.0 lb Date of Birth:  1925/01/29        BSA:          2.04 m Patient Age:    25 years          BP:           156/69 mmHg Patient Gender: M                 HR:           59 bpm. Exam Location:  [facility] Procedure: 2D Echo, Cardiac Doppler and Color Doppler Indications:     I410 Acute Myocardial Infarction  History:         Patient has prior history of Echocardiogram examinations, most                  recent 03/22/2016. Prior CABG; Risk Factors:Hypertension,                  Diabetes, Dyslipidemia and Obesity. Lower extremity edema.                  Hypothyroidism. Congestive heart failure. Coronary artery                  disease.  Sonographer:     Wilford Sports Rodgers-Jones Referring Phys:  [id number] CHRISTOPHER END Diagnosing Phys: Nelva Bush MD IMPRESSIONS  1. Left ventricular ejection fraction, by visual estimation, is 50 to 55%. The left ventricle has low normal function. There is mildly increased left ventricular hypertrophy.  2. Moderate hypokinesis of the left ventricular, mid anteroseptal wall.  3. Left ventricular diastolic parameters are consistent with Grade I diastolic dysfunction  (impaired relaxation).  4. The left ventricle demonstrates regional wall motion abnormalities.  5. Global right ventricle has mildly reduced systolic function.The right ventricular size is normal. No increase in right ventricular wall thickness.  6. Left atrial size was mildly dilated.  7. Right atrial size was normal.  8. The mitral valve is normal in structure. Mild mitral valve regurgitation. No evidence of mitral stenosis.  9. The tricuspid valve is normal in structure. 10. The tricuspid valve is normal in structure. Tricuspid valve regurgitation is trivial. 11. The aortic  valve is tricuspid. Aortic valve regurgitation is not visualized. Mild to moderate aortic valve sclerosis/calcification without any evidence of aortic stenosis. 12. The pulmonic valve was not well visualized. Pulmonic valve regurgitation is not visualized. 13. Aortic dilatation noted. 14. There is mild dilatation of the aortic root and of the ascending aorta measuring 38 mm. 15. TR signal is inadequate for assessing pulmonary artery systolic pressure. 16. The inferior vena cava is normal in size with greater than 50% respiratory variability, suggesting right atrial pressure of 3 mmHg. 17. The interatrial septum was not well visualized. FINDINGS  Left Ventricle: Left ventricular ejection fraction, by visual estimation, is 50 to 55%. The left ventricle has low normal function. Moderate hypokinesis of the left ventricular, mid anteroseptal wall. The left ventricle demonstrates regional wall motion  abnormalities. There is mildly increased left ventricular hypertrophy. Asymmetric left ventricular hypertrophy of the basal-septal wall. Left ventricular diastolic parameters are consistent with Grade I diastolic dysfunction (impaired relaxation). Right Ventricle: The right ventricular size is normal. No increase in right ventricular wall thickness. Global RV systolic function is has mildly reduced systolic function. Left Atrium: Left atrial size was  mildly dilated. Right Atrium: Right atrial size was normal in size Pericardium: There is no evidence of pericardial effusion. Mitral Valve: The mitral valve is normal in structure. Mild mitral valve regurgitation. No evidence of mitral valve stenosis by observation. Tricuspid Valve: The tricuspid valve is normal in structure. Tricuspid valve regurgitation is trivial. Aortic Valve: The aortic valve is tricuspid. . There is moderate thickening and mild calcification of the aortic valve. Aortic valve regurgitation is not visualized. Mild to moderate aortic valve sclerosis/calcification is present, without any evidence of aortic stenosis. There is moderate thickening of the aortic valve. There is mild calcification of the aortic valve. Aortic valve mean gradient measures 8.5 mmHg. Aortic valve peak gradient measures 13.7 mmHg. Aortic valve area, by VTI measures 1.69 cm. Pulmonic Valve: The pulmonic valve was not well visualized. Pulmonic valve regurgitation is not visualized. Pulmonic regurgitation is not visualized. No evidence of pulmonic stenosis. Aorta: Aortic dilatation noted. There is mild dilatation of the aortic root and of the ascending aorta measuring 38 mm. Pulmonary Artery: The pulmonary artery is not well seen. Venous: The inferior vena cava is normal in size with greater than 50% respiratory variability, suggesting right atrial pressure of 3 mmHg. IAS/Shunts: The interatrial septum was not well visualized.  LEFT VENTRICLE PLAX 2D LVIDd:         5.48 cm  Diastology LVIDs:         4.03 cm  LV e' lateral:   8.05 cm/s LV PW:         0.87 cm  LV E/e' lateral: 11.4 LV IVS:        1.23 cm  LV e' medial:    6.85 cm/s LVOT diam:     2.40 cm  LV E/e' medial:  13.4 LV SV:         75 ml LV SV Index:   35.31 LVOT Area:     4.52 cm  RIGHT VENTRICLE RV Basal diam:  3.51 cm RV S prime:     10.30 cm/s TAPSE (M-mode): 1.8 cm LEFT ATRIUM             Index       RIGHT ATRIUM           Index LA diam:        5.00 cm 2.45  cm/m  RA Area:  10.50 cm LA Vol (A2C):   58.5 ml 28.69 ml/m RA Volume:   23.10 ml  11.33 ml/m LA Vol (A4C):   55.1 ml 27.02 ml/m LA Biplane Vol: 56.9 ml 27.90 ml/m  AORTIC VALVE AV Area (Vmax):    1.65 cm AV Area (Vmean):   1.73 cm AV Area (VTI):     1.69 cm AV Vmax:           184.75 cm/s AV Vmean:          136.250 cm/s AV VTI:            0.410 m AV Peak Grad:      13.7 mmHg AV Mean Grad:      8.5 mmHg LVOT Vmax:         67.50 cm/s LVOT Vmean:        52.200 cm/s LVOT VTI:          0.153 m LVOT/AV VTI ratio: 0.37  AORTA Ao Root diam: 3.80 cm Ao Asc diam:  3.80 cm MITRAL VALVE MV Area (PHT): 3.31 cm             SHUNTS MV PHT:        66.41 msec           Systemic VTI:  0.15 m MV Decel Time: 229 msec             Systemic Diam: 2.40 cm MV E velocity: 92.00 cm/s 103 cm/s MV A velocity: 96.10 cm/s 70.3 cm/s MV E/A ratio:  0.96       1.5  Harrell Gave End MD Electronically signed by Nelva Bush MD Signature Date/Time: 12/18/2019/1:04:33 PM    Final     Time Spent in minutes 25   Thornell Mule M.D on 12/18/2019 at 3:27 PM  Between 7am to 7pm - Pager - [phone]  After 7pm go to [email or web address] - password [facility]  [facility] -  Office  [phone]

## 2019-12-18 NOTE — Telephone Encounter (Signed)
Patient daughter Jocelyn Lamer calling States that patient is in hospital and she is in town but they will not let her in to see him  States they are considering releasing him but she spoke with him on the phone and does not feel he is ready and that her mother is not telling all symptoms  Please call to discuss at [phone]

## 2019-12-18 NOTE — Plan of Care (Signed)
  Problem: Cardiovascular: Goal: Ability to achieve and maintain adequate cardiovascular perfusion will improve Outcome: Progressing Goal: Vascular access site(s) Level 0-1 will be maintained Outcome: Progressing   

## 2019-12-18 NOTE — Telephone Encounter (Signed)
Spoke with patients daughter per release form and she states that they are releasing him today and she wanted to know how this can be done if his problem has not been fixed. Reviewed that his procedure was done and she is aware of this and the results. She did report he has had headache last night and today. She was concerned about aneurysm and recommended that she call and speak with his nurse caring for him today and mention her concerns or request to speak with his provider. She verbalized understanding with no further questions.

## 2019-12-18 NOTE — Evaluation (Signed)
Physical Therapy Evaluation Patient Details Name: Joseph [name] MRN: [id number] DOB: 27-Apr-1925 Today's Date: 12/18/2019   History of Present Illness  Joseph [name] is a 34yoM who [facility] on 12/16/19 intermittent chest pain for ~3-4 days. Pt sent in from Dr. Donivan Scull office (cardiology). Sequential high sensitivity troponin in the 400s, 400s, 500s.  Underwent cardiac cath 1/26 for NSTEMI. PMH: CAD status post CABG, chronic diastolic congestive heart failure, type II diabetes on insulin, hyperlipidemia, peripheral arterial disease.  Clinical Impression  Pt admitted with above diagnosis. Pt currently with functional limitations due to the deficits listed below (see "PT Problem List"). Upon entry, pt in bed, awake and agreeable to participate. Pt c/o significant ongoing HA since procedure which has not really improved with meds as of yet. The pt is alert and oriented x4, pleasant, conversational, and generally a good historian. Moving generally well with all mobility, no assist needed, only moderate effort required, and no obvious instability or unsteadiness on feet. Functional mobility assessment demonstrates increased effort/time requirements, poor tolerance, and need for physical assistance, whereas the patient performed these at a higher level of independence PTA. Session largely limited by severe HA and generally feeling poor, but pt appears safe and HR remains well controlled in 80s-90s c AMB. Pt will benefit from skilled PT intervention to increase independence and safety with basic mobility in preparation for discharge to the venue listed below.       Follow Up Recommendations Supervision for mobility/OOB;Other (comment)(cardiac rehab)    Equipment Recommendations  None recommended by PT    Recommendations for Other Services       Precautions / Restrictions Precautions Precautions: Fall Restrictions Weight Bearing Restrictions: No      Mobility  Bed Mobility Overal bed  mobility: Modified Independent                Transfers Overall transfer level: Modified independent Equipment used: None             General transfer comment: no balance issues  Ambulation/Gait Ambulation/Gait assistance: Modified independent (Device/Increase time) Gait Distance (Feet): 75 Feet Assistive device: None Gait Pattern/deviations: WFL(Within Functional Limits)     General Gait Details: moving generally well, but limited by HA  Stairs            Wheelchair Mobility    Modified Rankin (Stroke Patients Only)       Balance Overall balance assessment: Modified Independent                                           Pertinent Vitals/Pain Pain Assessment: 0-10 Pain Score: 8  Pain Location: HA since procedure Pain Descriptors / Indicators: Aching Pain Intervention(s): Limited activity within patient's tolerance;Premedicated before session;Monitored during session    Home Living Family/patient expects to be discharged to:: Private residence Living Arrangements: Spouse/significant other Available Help at Discharge: Family Type of Home: House Home Access: Stairs to enter Entrance Stairs-Rails: Psychiatric nurse of Steps: 3 Home Layout: One level Home Equipment: Environmental consultant - 2 wheels;Walker - 4 wheels;Bedside commode      Prior Function Level of Independence: Independent         Comments: no AD use PTA, mostly home bound, but woudl walk at store.     Hand Dominance        Extremity/Trunk Assessment   Upper Extremity Assessment Upper Extremity Assessment: Overall [facility]  for tasks assessed    Lower Extremity Assessment Lower Extremity Assessment: Overall WFL for tasks assessed       Communication   Communication: No difficulties  Cognition Arousal/Alertness: Awake/alert Behavior During Therapy: WFL for tasks assessed/performed Overall Cognitive Status: Within Functional Limits for tasks assessed                                         General Comments      Exercises     Assessment/Plan    PT Assessment Patient needs continued PT services  PT Problem List Decreased strength;Decreased activity tolerance;Decreased mobility       PT Treatment Interventions Functional mobility training;Therapeutic activities;Therapeutic exercise    PT Goals (Current goals can be found in the Care Plan section)  Acute Rehab PT Goals Patient Stated Goal: improve HA PT Goal Formulation: With patient Time For Goal Achievement: 01/01/20 Potential to Achieve Goals: Good    Frequency Min 2X/week   Barriers to discharge        Co-evaluation               AM-PAC PT "6 Clicks" Mobility  Outcome Measure Help needed turning from your back to your side while in a flat bed without using bedrails?: None Help needed moving from lying on your back to sitting on the side of a flat bed without using bedrails?: None Help needed moving to and from a bed to a chair (including a wheelchair)?: A Little Help needed standing up from a chair using your arms (e.g., wheelchair or bedside chair)?: A Little Help needed to walk in hospital room?: A Little Help needed climbing 3-5 steps with a railing? : A Little 6 Click Score: 20    End of Session   Activity Tolerance: Patient tolerated treatment well;Patient limited by pain Patient left: in chair;with family/visitor present;with chair alarm set Nurse Communication: Mobility status PT Visit Diagnosis: Muscle weakness (generalized) (M62.81);Other abnormalities of gait and mobility (R26.89)    Time: 1020-1048 PT Time Calculation (min) (ACUTE ONLY): 28 min   Charges:   PT Evaluation $PT Eval Moderate Complexity: 1 Mod          1:04 PM, 12/18/19 Etta Grandchild, PT, DPT Physical Therapist - [facility]  [phone] ([place])    Sherlon Nied C 12/18/2019, 1:02 PM

## 2019-12-18 NOTE — Progress Notes (Addendum)
Progress Note  Patient Name: Joseph [name] Date of Encounter: 12/18/2019  Primary Cardiologist: Rockey Situ  Subjective   Patient feels okay.  Denies chest pain or shortness of breath.  Wondering if he could go home today.  Inpatient Medications    Scheduled Meds: . vitamin C  500 mg Oral QHS  . aspirin EC  81 mg Oral Daily  . atorvastatin  40 mg Oral QPM  . cholecalciferol  2,000 Units Oral QPM  . clopidogrel  75 mg Oral Daily  . enoxaparin (LOVENOX) injection  40 mg Subcutaneous Q24H  . insulin aspart  0-5 Units Subcutaneous QHS  . insulin aspart  0-9 Units Subcutaneous TID WC  . insulin glargine  20 Units Subcutaneous QHS  . isosorbide mononitrate  15 mg Oral Daily  . levothyroxine  25 mcg Oral QAC breakfast  . losartan  100 mg Oral QHS  . pantoprazole  40 mg Oral Daily  . sodium chloride flush  3 mL Intravenous Once  . sodium chloride flush  3 mL Intravenous Q12H   Continuous Infusions: . sodium chloride     PRN Meds: sodium chloride, acetaminophen, nitroGLYCERIN, nitroGLYCERIN, ondansetron (ZOFRAN) IV, sodium chloride flush   Vital Signs    Vitals:   12/17/19 1618 12/17/19 1937 12/18/19 0452 12/18/19 0801  BP: (!) 162/96 (!) 156/69 (!) 146/69 124/73  Pulse: (!) 53 71 72 (!) 56  Resp: 17  20 17   Temp: 98.2 F (36.8 C) 98.1 F (36.7 C) 98.1 F (36.7 C) 98.6 F (37 C)  TempSrc: Oral Oral Oral Oral  SpO2: 97% 98% 100% 96%  Weight:      Height:        Intake/Output Summary (Last 24 hours) at 12/18/2019 0843 Last data filed at 12/17/2019 2136 Gross per 24 hour  Intake 1433.15 ml  Output 900 ml  Net 533.15 ml   Last 3 Weights 12/17/2019 12/16/2019 12/16/2019  Weight (lbs) 204 lb 208 lb 1.6 oz 214 lb 8 oz  Weight (kg) 92.534 kg 94.394 kg 97.297 kg      Telemetry    Sinus rhythm, Mobitz 1 AV block- Personally Reviewed  ECG    EKG today pending- Personally Reviewed  Physical Exam   GEN: No acute distress.   Neck: No JVD Cardiac: RRR, 1/6 systolic  murmur, no rubs, or gallops.  Respiratory: Clear to auscultation bilaterally. GI: Soft, nontender, distended  MS: No edema; No deformity. Neuro:  Nonfocal  Psych: Normal affect   Labs    High Sensitivity Troponin:   Recent Labs  Lab 12/16/19 1047 12/16/19 1338 12/16/19 1640 12/16/19 1920 12/16/19 2105  TROPONINIHS 450* 477* 588* 542* 539*      Chemistry Recent Labs  Lab 12/16/19 1338 12/18/19 0457  NA 138 139  K 3.6 3.5  CL 103 106  CO2 27 24  GLUCOSE 172* 144*  BUN 17 15  CREATININE 1.07 1.06  CALCIUM 8.9 8.4*  PROT 6.8  --   ALBUMIN 3.4*  --   AST 24  --   ALT 21  --   ALKPHOS 47  --   BILITOT 0.6  --   GFRNONAA 60* >60  GFRAA >60 >60  ANIONGAP 8 9     Hematology Recent Labs  Lab 12/16/19 1338 12/18/19 0457  WBC 8.6 7.8  RBC 4.57 4.37  HGB 13.9 13.2  HCT 40.9 39.0  MCV 89.5 89.2  MCH 30.4 30.2  MCHC 34.0 33.8  RDW 13.4 13.3  PLT 195 170  BNPNo results for input(s): BNP, PROBNP in the last 168 hours.   DDimer No results for input(s): DDIMER in the last 168 hours.   Radiology    DG Chest 2 View  Result Date: 12/16/2019 CLINICAL DATA:  Elevated troponins. EXAM: CHEST - 2 VIEW COMPARISON:  03/22/2016 and CT chest 03/21/2016. FINDINGS: Trachea is midline. Heart size normal. Thoracic aorta is calcified. Lungs are clear. No pleural fluid. Flowing anterior osteophytosis in the thoracic spine. IMPRESSION: No acute findings. Electronically Signed   By: Lorin Picket M.D.   On: 12/16/2019 14:02   CT Head Wo Contrast  Result Date: 12/16/2019 CLINICAL DATA:  Question intracranial hemorrhage. EXAM: CT HEAD WITHOUT CONTRAST TECHNIQUE: Contiguous axial images were obtained from the base of the skull through the vertex without intravenous contrast. COMPARISON:  02/03/2017 FINDINGS: Brain: Ordinary age related volume loss. No evidence of old or acute focal infarction, mass lesion, hemorrhage, hydrocephalus or extra-axial collection. Vascular: There is  atherosclerotic calcification of the major vessels at the base of the brain. Skull: Negative Sinuses/Orbits: Clear/normal Other: None IMPRESSION: No acute or focal CT finding.  Age related atrophy. Electronically Signed   By: Nelson Chimes M.D.   On: 12/16/2019 15:54   CARDIAC CATHETERIZATION  Result Date: 12/18/2019  The left ventricular ejection fraction is 35-45% by visual estimate.  Conclusions: 1. Severe native CAD, including 80% distal LMCA, 100% proximal LAD and diffuse LCx/OM disease.  90% mid RCA stenosis and CTO of rPDA also present. 2. Patent LIMA->D3 with backfilling of the mid/distal LADl. 3. Patent SVG-D1-D2, SVG-OM1-OM2, and SVG-rPDA-rPL. 4. Mildly to moderately elevated LVEDP. 5. At least moderately reduced left ventricular systolic function.  Recommendations: 1. Medical therapy; recommend up to 12 months of DAPT with ASA and clopidogrel, as tolerated. 2. Aggressive secondary prevention of coronary artery disease. 3. Obtain echocardiogram to better assess LVEF.Marland Kitchen 4. Monitor on telemetry.  Consider EP consultation of AV block persists, particularly if the patient is symptoms. Nelva Bush, MD [facility]   Cardiac Studies   [facility]HC 12/17/2019 FINDINGS: 1. Severe native CAD, including 80% distal LMCA, 100% proximal LAD, and severe diffuse LCx disease.  90% mid RCA stenosis and CTO of rPDA also present. 2. Patent LIMA->diagonal. 3. Patent SVG-diagonal, SVG-OM1-OM2, and SVG-rPDA-rPL. 4. Mildly to moderately elevated LVEDP. 5. At least moderately reduced LVE.  RECOMMENDATIONS: 1. Medical therapy; recommend up to 12 months of DAPT with ASA and clopidogrel, as tolerated. 2. Aggressive secondary prevention of CAD. 3. Obtain TTE. 4. Monitor on telemetry.  Consider EP consultation of AV block persists, particularly if the patient is symptoms  Patient Profile     86 y.o. male with history of CAD status post CABG in 7, PAD, diabetes, hypertension who presents with chest pain found to  have NSTEMI.    Assessment & Plan    Left heart cath performed yesterday showed patent grafts.  Patient currently symptom-free.  1.  NSTEMI history of CAD, CABG. -Left heart cath with patent grafts and severe diffuse native CAD -DAPT/aspirin and Plavix x12 months as tolerated. -Lipitor, Imdur15 mg daily. -Holding beta-blocker due to heart rate in the 60s and Mobitz 1 AV block on telemetry. -Patient can be discharged on currently prescribed medications -Ordered titration of antianginal therapy can be done as outpatient if needed.  2.  Mobitz 1 AV block -Patient is asymptomatic -Cardiac monitor/Zio patch placed today. -Patient can be discharged after cardiac monitor is placed.  3. HTN -losartan, imdur    Signed, Kate Sable, MD  12/18/2019,  8:43 AM    Addendum Was contacted by nursing staff stating that patient is having headaches.  This is likely secondary to Imdur.  Recommend stopping Imdur for now and monitor symptoms. if symptoms do not resolve I recommend head CT to evaluate any intracranial pathology if okay with hospitalist service.  He may have to stay an additional 24 hours to monitor the symptoms.

## 2019-12-19 ENCOUNTER — Telehealth: Payer: Self-pay | Admitting: Cardiovascular Disease

## 2019-12-19 DIAGNOSIS — E782 Mixed hyperlipidemia: Secondary | ICD-10-CM

## 2019-12-19 DIAGNOSIS — I455 Other specified heart block: Secondary | ICD-10-CM | POA: Diagnosis not present

## 2019-12-19 DIAGNOSIS — I471 Supraventricular tachycardia: Secondary | ICD-10-CM

## 2019-12-19 LAB — GLUCOSE, CAPILLARY
Glucose-Capillary: 118 mg/dL — ABNORMAL HIGH (ref 70–99)
Glucose-Capillary: 173 mg/dL — ABNORMAL HIGH (ref 70–99)

## 2019-12-19 MED ORDER — ATORVASTATIN CALCIUM 80 MG PO TABS: 80.0000 mg | ORAL_TABLET | Freq: Every evening | ORAL | Status: DC

## 2019-12-19 MED ORDER — ATORVASTATIN CALCIUM 80 MG PO TABS: 80.0000 mg | ORAL_TABLET | Freq: Every evening | ORAL | 0 refills | Status: DC

## 2019-12-19 MED ORDER — METOPROLOL SUCCINATE ER 50 MG PO TB24: 50.0000 mg | ORAL_TABLET | Freq: Every day | ORAL | 0 refills | Status: DC

## 2019-12-19 MED ORDER — TORSEMIDE 20 MG PO TABS: 20.0000 mg | ORAL_TABLET | ORAL | 0 refills | Status: DC | PRN

## 2019-12-19 MED ORDER — POTASSIUM CHLORIDE ER 10 MEQ PO TBCR: 10.0000 meq | EXTENDED_RELEASE_TABLET | Freq: Every day | ORAL | 3 refills | Status: DC

## 2019-12-19 MED ORDER — CLOPIDOGREL BISULFATE 75 MG PO TABS: 75.0000 mg | ORAL_TABLET | Freq: Every day | ORAL | 1 refills | Status: DC

## 2019-12-19 NOTE — Progress Notes (Addendum)
Progress Note  Patient Name: Joseph [name] Date of Encounter: 12/19/2019  Primary Cardiologist: Colyn Miron-[facility]  Subjective   Reports that he feels well, denies any significant chest pain No shortness of breath Reports that he had a headache but this has resolved Wife at the bedside, Discussed cardiac catheterization results with him -Telemetry reviewed, no significant bradycardia  Inpatient Medications    Scheduled Meds: . vitamin C  500 mg Oral QHS  . aspirin EC  81 mg Oral Daily  . atorvastatin  80 mg Oral QPM  . cholecalciferol  2,000 Units Oral QPM  . clopidogrel  75 mg Oral Daily  . enoxaparin (LOVENOX) injection  40 mg Subcutaneous Q24H  . insulin aspart  0-5 Units Subcutaneous QHS  . insulin aspart  0-9 Units Subcutaneous TID WC  . insulin glargine  20 Units Subcutaneous QHS  . levothyroxine  25 mcg Oral QAC breakfast  . losartan  100 mg Oral QHS  . pantoprazole  40 mg Oral Daily  . sodium chloride flush  3 mL Intravenous Once  . sodium chloride flush  3 mL Intravenous Q12H   Continuous Infusions: . sodium chloride     PRN Meds: sodium chloride, acetaminophen, nitroGLYCERIN, ondansetron (ZOFRAN) IV, sodium chloride flush, traMADol   Vital Signs    Vitals:   12/18/19 1637 12/18/19 2004 12/19/19 0513 12/19/19 0726  BP: (!) 149/68 139/69 (!) 159/68 (!) 159/63  Pulse: 76 (!) 58 (!) 58 (!) 58  Resp: 18 20 20 16   Temp: 98.5 F (36.9 C) 100.2 F (37.9 C) 98.3 F (36.8 C) 98.4 F (36.9 C)  TempSrc: Oral Oral Oral Oral  SpO2: 96% 96% 97% 97%  Weight:      Height:        Intake/Output Summary (Last 24 hours) at 12/19/2019 1432 Last data filed at 12/19/2019 0809 Gross per 24 hour  Intake --  Output 300 ml  Net -300 ml   Last 3 Weights 12/17/2019 12/16/2019 12/16/2019  Weight (lbs) 204 lb 208 lb 1.6 oz 214 lb 8 oz  Weight (kg) 92.534 kg 94.394 kg 97.297 kg      Telemetry    Normal sinus rhythm, first-degree AV block type I personally Reviewed  ECG      - Personally Reviewed  Physical Exam   GEN: No acute distress.   Neck: No JVD Cardiac: RRR, no murmurs, rubs, or gallops.  Respiratory: Clear to auscultation bilaterally. GI: Soft, nontender, non-distended  MS: No edema; No deformity. Neuro:  Nonfocal  Psych: Normal affect   Labs    High Sensitivity Troponin:   Recent Labs  Lab 12/16/19 1047 12/16/19 1338 12/16/19 1640 12/16/19 1920 12/16/19 2105  TROPONINIHS 450* 477* 588* 542* 539*      Chemistry Recent Labs  Lab 12/16/19 1338 12/18/19 0457  NA 138 139  K 3.6 3.5  CL 103 106  CO2 27 24  GLUCOSE 172* 144*  BUN 17 15  CREATININE 1.07 1.06  CALCIUM 8.9 8.4*  PROT 6.8  --   ALBUMIN 3.4*  --   AST 24  --   ALT 21  --   ALKPHOS 47  --   BILITOT 0.6  --   GFRNONAA 60* >60  GFRAA >60 >60  ANIONGAP 8 9     Hematology Recent Labs  Lab 12/16/19 1338 12/18/19 0457  WBC 8.6 7.8  RBC 4.57 4.37  HGB 13.9 13.2  HCT 40.9 39.0  MCV 89.5 89.2  MCH 30.4 30.2  MCHC  34.0 33.8  RDW 13.4 13.3  PLT 195 170    BNPNo results for input(s): BNP, PROBNP in the last 168 hours.   DDimer No results for input(s): DDIMER in the last 168 hours.   Radiology    ECHOCARDIOGRAM COMPLETE  Result Date: 12/18/2019   ECHOCARDIOGRAM REPORT   Patient Name:   Joseph [name] Date of Exam: 12/17/2019 Medical Rec #:  [id number]         Height:       67.0 in Accession #:    [id number]        Weight:       204.0 lb Date of Birth:  [birth year]        BSA:          2.04 m Patient Age:    86 years          BP:           156/69 mmHg Patient Gender: M                 HR:           59 bpm. Exam Location:  [facility] Procedure: 2D Echo, Cardiac Doppler and Color Doppler Indications:     I410 Acute Myocardial Infarction  History:         Patient has prior history of Echocardiogram examinations, most                  recent 03/22/2016. Prior CABG; Risk Factors:Hypertension,                  Diabetes, Dyslipidemia and Obesity. Lower extremity edema.                   Hypothyroidism. Congestive heart failure. Coronary artery                  disease.  Sonographer:     Wilford Sports Rodgers-Jones Referring Phys:  [id number] CHRISTOPHER END Diagnosing Phys: Nelva Bush MD IMPRESSIONS  1. Left ventricular ejection fraction, by visual estimation, is 50 to 55%. The left ventricle has low normal function. There is mildly increased left ventricular hypertrophy.  2. Moderate hypokinesis of the left ventricular, mid anteroseptal wall.  3. Left ventricular diastolic parameters are consistent with Grade I diastolic dysfunction (impaired relaxation).  4. The left ventricle demonstrates regional wall motion abnormalities.  5. Global right ventricle has mildly reduced systolic function.The right ventricular size is normal. No increase in right ventricular wall thickness.  6. Left atrial size was mildly dilated.  7. Right atrial size was normal.  8. The mitral valve is normal in structure. Mild mitral valve regurgitation. No evidence of mitral stenosis.  9. The tricuspid valve is normal in structure. 10. The tricuspid valve is normal in structure. Tricuspid valve regurgitation is trivial. 11. The aortic valve is tricuspid. Aortic valve regurgitation is not visualized. Mild to moderate aortic valve sclerosis/calcification without any evidence of aortic stenosis. 12. The pulmonic valve was not well visualized. Pulmonic valve regurgitation is not visualized. 13. Aortic dilatation noted. 14. There is mild dilatation of the aortic root and of the ascending aorta measuring 38 mm. 15. TR signal is inadequate for assessing pulmonary artery systolic pressure. 16. The inferior vena cava is normal in size with greater than 50% respiratory variability, suggesting right atrial pressure of 3 mmHg. 17. The interatrial septum was not well visualized. FINDINGS  Left Ventricle: Left ventricular ejection fraction, by visual estimation, is 50 to 55%. The left  ventricle has low normal function. Moderate  hypokinesis of the left ventricular, mid anteroseptal wall. The left ventricle demonstrates regional wall motion  abnormalities. There is mildly increased left ventricular hypertrophy. Asymmetric left ventricular hypertrophy of the basal-septal wall. Left ventricular diastolic parameters are consistent with Grade I diastolic dysfunction (impaired relaxation). Right Ventricle: The right ventricular size is normal. No increase in right ventricular wall thickness. Global RV systolic function is has mildly reduced systolic function. Left Atrium: Left atrial size was mildly dilated. Right Atrium: Right atrial size was normal in size Pericardium: There is no evidence of pericardial effusion. Mitral Valve: The mitral valve is normal in structure. Mild mitral valve regurgitation. No evidence of mitral valve stenosis by observation. Tricuspid Valve: The tricuspid valve is normal in structure. Tricuspid valve regurgitation is trivial. Aortic Valve: The aortic valve is tricuspid. . There is moderate thickening and mild calcification of the aortic valve. Aortic valve regurgitation is not visualized. Mild to moderate aortic valve sclerosis/calcification is present, without any evidence of aortic stenosis. There is moderate thickening of the aortic valve. There is mild calcification of the aortic valve. Aortic valve mean gradient measures 8.5 mmHg. Aortic valve peak gradient measures 13.7 mmHg. Aortic valve area, by VTI measures 1.69 cm. Pulmonic Valve: The pulmonic valve was not well visualized. Pulmonic valve regurgitation is not visualized. Pulmonic regurgitation is not visualized. No evidence of pulmonic stenosis. Aorta: Aortic dilatation noted. There is mild dilatation of the aortic root and of the ascending aorta measuring 38 mm. Pulmonary Artery: The pulmonary artery is not well seen. Venous: The inferior vena cava is normal in size with greater than 50% respiratory variability, suggesting right atrial pressure of 3 mmHg.  IAS/Shunts: The interatrial septum was not well visualized.  LEFT VENTRICLE PLAX 2D LVIDd:         5.48 cm  Diastology LVIDs:         4.03 cm  LV e' lateral:   8.05 cm/s LV PW:         0.87 cm  LV E/e' lateral: 11.4 LV IVS:        1.23 cm  LV e' medial:    6.85 cm/s LVOT diam:     2.40 cm  LV E/e' medial:  13.4 LV SV:         75 ml LV SV Index:   35.31 LVOT Area:     4.52 cm  RIGHT VENTRICLE RV Basal diam:  3.51 cm RV S prime:     10.30 cm/s TAPSE (M-mode): 1.8 cm LEFT ATRIUM             Index       RIGHT ATRIUM           Index LA diam:        5.00 cm 2.45 cm/m  RA Area:     10.50 cm LA Vol (A2C):   58.5 ml 28.69 ml/m RA Volume:   23.10 ml  11.33 ml/m LA Vol (A4C):   55.1 ml 27.02 ml/m LA Biplane Vol: 56.9 ml 27.90 ml/m  AORTIC VALVE AV Area (Vmax):    1.65 cm AV Area (Vmean):   1.73 cm AV Area (VTI):     1.69 cm AV Vmax:           184.75 cm/s AV Vmean:          136.250 cm/s AV VTI:            0.410 m AV Peak Grad:  13.7 mmHg AV Mean Grad:      8.5 mmHg LVOT Vmax:         67.50 cm/s LVOT Vmean:        52.200 cm/s LVOT VTI:          0.153 m LVOT/AV VTI ratio: 0.37  AORTA Ao Root diam: 3.80 cm Ao Asc diam:  3.80 cm MITRAL VALVE MV Area (PHT): 3.31 cm             SHUNTS MV PHT:        66.41 msec           Systemic VTI:  0.15 m MV Decel Time: 229 msec             Systemic Diam: 2.40 cm MV E velocity: 92.00 cm/s 103 cm/s MV A velocity: 96.10 cm/s 70.3 cm/s MV E/A ratio:  0.96       1.5  Harrell Gave End MD Electronically signed by Nelva Bush MD Signature Date/Time: 12/18/2019/1:04:33 PM    Final     Cardiac Studies   Cardiac catheterization  The left ventricular ejection fraction is 35-45% by visual estimate.   Conclusions: 1. Severe native CAD, including 80% distal LMCA, 100% proximal LAD and diffuse LCx/OM disease. 90% mid RCA stenosis and CTO of rPDA also present. 2. Patent LIMA->D3 with backfilling of the mid/distal LADl. 3. Patent SVG-D1-D2, SVG-OM1-OM2, and SVG-rPDA-rPL. 4. Mildly to  moderately elevated LVEDP. 5. At least moderately reduced left ventricular systolic function.  Recommendations: 1. Medical therapy; recommend up to 12 months of DAPT with ASA and clopidogrel, as tolerated. 2. Aggressive secondary prevention of coronary artery disease. 3. Obtain echocardiogram to better assess LVEF.Marland Kitchen 4. Monitor on telemetry. Consider EP consultation of AV block persists, particularly if the patient is symptoms.  Patient Profile  86 y.o. male with history of CAD status post CABG in 23, PAD, diabetes, hypertension who presents from the cardiology office with chest pain found to have NSTEMI.    Assessment & Plan    Left heart cath performed yesterday showed patent grafts.  Patient currently symptom-free.  1.  NSTEMI history of CAD, CABG. Left heart catheterization with patent grafts, severe native vessel coronary disease --Recommendation made for aspirin Plavix 12 months Continue Lipitor 80 mg daily, Imdur 15 mg daily --Beta-blocker on hold secondary to bradycardia, second-degree AV block type I on telemetry -Sublingual nitroglycerin for any further chest pain  2.  Mobitz 1 AV block Asymptomatic, beta-blocker on hold ZIO monitor placed yesterday evening  3.  Atrial tachycardia Noted while he was in the office prior to admission Was relatively asymptomatic, not a good candidate for beta-blocker though will look for results of his ZIO monitor  4. HTN Continue current medications as above, no further titration  Long discussion with patient and wife with discharge planning, further discussion concerning anginal symptoms, treatment with sublingual nitro, recent cardiac catheterization results, arrhythmia/bradycardia   Total encounter time more than 45 minutes  Greater than 50% was spent in counseling and coordination of care with the patient  For questions or updates, please contact [employer] Please consult [email or web address] for contact info under          Signed, Ida Rogue, MD  12/19/2019, 2:32 PM

## 2019-12-19 NOTE — Progress Notes (Signed)
Joseph [name] to be D/C'd Home per MD order.  Discussed prescriptions and follow up appointments with the patient. Prescriptions were eprescribed, medication list explained in detail. Pt verbalized understanding.  Allergies as of 12/19/2019   No Known Allergies     Medication List    STOP taking these medications   hydrochlorothiazide 25 MG tablet Commonly known as: HYDRODIURIL     TAKE these medications   aspirin EC 81 MG tablet Take 1 tablet (81 mg total) by mouth daily.   atorvastatin 80 MG tablet Commonly known as: LIPITOR Take 1 tablet (80 mg total) by mouth every evening. What changed:   medication strength  how much to take  when to take this   calcipotriene 0.005 % cream Commonly known as: DOVONOX Apply topically as needed (to creases).   clobetasol cream 0.05 % Commonly known as: TEMOVATE Apply 1 application topically 2 (two) times daily. As needed to hands and elbows   clopidogrel 75 MG tablet Commonly known as: PLAVIX Take 1 tablet (75 mg total) by mouth daily. Start taking on: December 20, 2019   hydrocortisone 2.5 % cream Apply 1 application topically 2 (two) times daily. As needed to face, crease, groin and butt   insulin aspart 100 UNIT/ML injection Commonly known as: novoLOG Inject 0-9 Units into the skin 3 (three) times daily with meals. What changed:   how much to take  additional instructions   insulin glargine 100 UNIT/ML injection Commonly known as: Lantus Inject 0.68 mLs (68 Units total) into the skin at bedtime. What changed: how much to take   ketoconazole 2 % cream Commonly known as: NIZORAL Apply 1 application topically 2 (two) times daily as needed for irritation. To groin, fave and buttocks.   levothyroxine 50 MCG tablet Commonly known as: SYNTHROID TAKE 1 TABLET BY MOUTH ONCE DAILY BEFORE BREAKFAST What changed: See the new instructions.   losartan 100 MG tablet Commonly known as: COZAAR Take 1 tablet (100 mg total)  by mouth daily. What changed: See the new instructions.   Magnesium 250 MG Tabs Take 250 mg by mouth daily as needed (swelling).   metoprolol succinate 50 MG 24 hr tablet Commonly known as: TOPROL-XL Take 1 tablet (50 mg total) by mouth daily. Take with or immediately following a meal.  Hold if SBP<115 or DBP<55 or HR<60 What changed: additional instructions   mometasone 0.1 % ointment Commonly known as: ELOCON Apply topically daily. As needed to scalp and ears   nitroGLYCERIN 0.4 MG SL tablet Commonly known as: Nitrostat Place 1 tablet (0.4 mg total) under the tongue every 5 (five) minutes as needed for chest pain.   omeprazole 20 MG capsule Commonly known as: PRILOSEC Take 1 capsule by mouth once daily What changed: when to take this   potassium chloride 10 MEQ tablet Commonly known as: KLOR-CON Take 1 tablet (10 mEq total) by mouth daily. Take 1 tablet (10 meq) daily What changed:   how much to take  when to take this  additional instructions   torsemide 20 MG tablet Commonly known as: DEMADEX Take 1 tablet (20 mg total) by mouth as needed (As needed for swelling and shortness of breath). Also take extra potassium 10 meq with this.   vitamin C 500 MG tablet Commonly known as: ASCORBIC ACID Take 500 mg by mouth at bedtime.   Vitamin D3 50 MCG (2000 UT) Tabs Take 1 capsule by mouth every evening.       Vitals:   12/19/19  0513 12/19/19 0726  BP: (!) 159/68 (!) 159/63  Pulse: (!) 58 (!) 58  Resp: 20 16  Temp: 98.3 F (36.8 C) 98.4 F (36.9 C)  SpO2: 97% 97%    Tele box removed and returned.Skin clean, dry and intact without evidence of skin break down, no evidence of skin tears noted. IV catheter discontinued intact. Site without signs and symptoms of complications. Dressing and pressure applied. Pt denies pain at this time. No complaints noted.  An After Visit Summary was printed and given to the patient. Patient escorted via [place], and D/C home via  private auto.  Rolley Sims

## 2019-12-19 NOTE — Discharge Summary (Signed)
Physician Discharge Summary  Patient ID: Joseph [name] MRN: [id number] DOB/AGE: Dec 17, 1924 86 y.o.  Admit date: 12/16/2019 Discharge date: 12/19/2019  Admission Diagnoses:  Discharge Diagnoses:  Active Problems:   Essential hypertension   Type 2 diabetes mellitus with diabetic peripheral angiopathy without gangrene, with long-term current use of insulin (HCC)   ACS (acute coronary syndrome) (HCC)   NSTEMI (non-ST elevated myocardial infarction) (HCC)   AV block, [place] 1   Discharged Condition: fair  Hospital Course:  Brief Narrative 86 year old male with CAD s/p CABG, chronic diastolic CHF, type 2 diabetes mellitus on insulin, hyperlipidemia, peripheral arterial disease sent to the ED from his cardiologist (Dr. Rockey Situ) office after having chest pain, radiating to the jaw and back of the arm.  EKG showed normal sinus rhythm.  Troponin was elevated at 450. Patient admitted for NSTEMI. Cardiac cath done on 1/26 showing severe native CAD with 80% distal LMCA 100% proximal LAD and severe diffuse left circumflex disease, 90% mid RCA stenosis.  Non-ST elevation MI (HCC) -Chest pain resolved Patient in status post left heart catheterization: severe native CAD with proximal LAD, diffuse left circumflex, RCA stenosis. Recommend medical therapy with at least 12 months of DAPT (aspirin and Plavix). --Continue aspirin and Plavix -  Follow follow-up 2D echo: 1. Left ventricular ejection fraction, by visual estimation, is 50 to 55%. The left ventricle has low normal function. There is mildly increased left ventricular hypertrophy. 2. Moderate hypokinesis of the left ventricular, mid anteroseptal wall. 3. Left ventricular diastolic parameters are consistent with Grade I diastolic dysfunction (impaired relaxation).  -  Continue statin.  Continue losartan -Continue torsemide - Zio monitor delivered and placed per cardiology for persistent AV block. -Strongly advised not to take beta-blocker  anymore at possible AV block -Patient is to follow-up with cardiology and heart failure clinic  Headache:  Resolved  -Repeat COVID-19 tested negative -Discontinued Imdur as it it was likely to be causing it  Essential hypertension Stable.   -Continue losartan and torsemide for now until sees cardiologist -Patient cannot take Imdur as it has caused headache  Type 2 diabetes mellitus with diabetic peripheral angiopathy without gangrene, with long-term current use of insulin ([place]) Resume home home meds and same dose  Chronic diastolic CHF Euvolemic.  Continue home meds including torsemide  Hypothyroidism Continue Synthroid  Consults: cardiology  Significant Diagnostic Studies: Radiology, echocardiogram, angiography and blood work  Treatments: Per hospital course and discharge med list  Discharge Exam: Blood pressure (!) 159/63, pulse (!) 58, temperature 98.4 F (36.9 C), temperature source Oral, resp. rate 16, height 5\' 7"  (1.702 m), weight 92.5 kg, SpO2 97 %.  Gen: not in distress HEENT:moist mucosa, supple neck Chest: clear b/l, no added sounds CVS: N S1&S2, no murmurs GI: soft, NT, ND,  Musculoskeletal: warm, no edema Neuro: Grossly intact no focal weakness or sensory deficit  Disposition: Discharge disposition: 06-Home-Health Care Svc     Stable to be discharged home with close follow-up with PCP and cardiology.  Warning signs and symptoms explained to patient when he must seek immediate medical attention.  Patient expressed understanding  Discharge Instructions    Amb Referral to Cardiac Rehabilitation   Complete by: As directed    Diagnosis: NSTEMI   After initial evaluation and assessments completed: Virtual Based Care may be provided alone or in conjunction with Phase 2 Cardiac Rehab based on patient barriers.: Yes   Call MD for:  difficulty breathing, headache or visual disturbances   Complete by: As directed  Diet - low sodium heart healthy    Complete by: As directed    Increase activity slowly   Complete by: As directed      Allergies as of 12/19/2019   No Known Allergies     Medication List    STOP taking these medications   hydrochlorothiazide 25 MG tablet Commonly known as: HYDRODIURIL   metoprolol succinate 50 MG 24 hr tablet Commonly known as: TOPROL-XL     TAKE these medications   aspirin EC 81 MG tablet Take 1 tablet (81 mg total) by mouth daily.   atorvastatin 80 MG tablet Commonly known as: LIPITOR Take 1 tablet (80 mg total) by mouth every evening. What changed:   medication strength  how much to take  when to take this   calcipotriene 0.005 % cream Commonly known as: DOVONOX Apply topically as needed (to creases).   clobetasol cream 0.05 % Commonly known as: TEMOVATE Apply 1 application topically 2 (two) times daily. As needed to hands and elbows   clopidogrel 75 MG tablet Commonly known as: PLAVIX Take 1 tablet (75 mg total) by mouth daily. Start taking on: December 20, 2019   hydrocortisone 2.5 % cream Apply 1 application topically 2 (two) times daily. As needed to face, crease, groin and butt   insulin aspart 100 UNIT/ML injection Commonly known as: novoLOG Inject 0-9 Units into the skin 3 (three) times daily with meals. What changed:   how much to take  additional instructions   insulin glargine 100 UNIT/ML injection Commonly known as: Lantus Inject 0.68 mLs (68 Units total) into the skin at bedtime. What changed: how much to take   ketoconazole 2 % cream Commonly known as: NIZORAL Apply 1 application topically 2 (two) times daily as needed for irritation. To groin, fave and buttocks.   levothyroxine 50 MCG tablet Commonly known as: SYNTHROID TAKE 1 TABLET BY MOUTH ONCE DAILY BEFORE BREAKFAST What changed: See the new instructions.   losartan 100 MG tablet Commonly known as: COZAAR Take 1 tablet (100 mg total) by mouth daily. What changed: See the new  instructions.   Magnesium 250 MG Tabs Take 250 mg by mouth daily as needed (swelling).   mometasone 0.1 % ointment Commonly known as: ELOCON Apply topically daily. As needed to scalp and ears   nitroGLYCERIN 0.4 MG SL tablet Commonly known as: Nitrostat Place 1 tablet (0.4 mg total) under the tongue every 5 (five) minutes as needed for chest pain.   omeprazole 20 MG capsule Commonly known as: PRILOSEC Take 1 capsule by mouth once daily What changed: when to take this   potassium chloride 10 MEQ tablet Commonly known as: KLOR-CON Take 1 tablet (10 mEq total) by mouth daily. Take 1 tablet (10 meq) daily What changed:   how much to take  when to take this  additional instructions   torsemide 20 MG tablet Commonly known as: DEMADEX Take 1 tablet (20 mg total) by mouth as needed (As needed for swelling and shortness of breath). Also take extra potassium 10 meq with this.   vitamin C 500 MG tablet Commonly known as: ASCORBIC ACID Take 500 mg by mouth at bedtime.   Vitamin D3 50 MCG (2000 UT) Tabs Take 1 capsule by mouth every evening.      Follow-up Information    [facility] Follow up.   Specialty: Cardiac Rehabilitation Why: Your Cardiologist has referred you to outpatient Cardiac Rehab at [facility]. Note:  Home Health Physical Therapy  will need to be completed first prior to starting outpatient Cardiac Rehab.  The Cardiac Rehab team will call you to check on your progress.   Contact information: [place] [id number] [place]       [name]., MD. Schedule an appointment as soon as possible for a visit in 2 weeks.   Specialty: Family Medicine Why: Patient can see Dr. Rosanna Randy on Feb. 4th at 3:40pm Contact information: [address] [place] [place] [address] [id number]        Minna Merritts, MD. Schedule an appointment as soon as possible for a visit in 4 weeks.    Specialty: Cardiology Why: Patient can see Dr. Rockey Situ on Feb. 8th at 1:30pm Contact information: [place] [address] [phone]           Signed: Thornell Mule 12/19/2019, 3:14 PM

## 2019-12-19 NOTE — Telephone Encounter (Signed)
Patient and family want to go over medications for home.  Patient daughter feels like there are many discrepancies for home list  She feels like the hospital ist may not be familiar with patient and she wants to make sure med list is correct .

## 2019-12-19 NOTE — Care Management Important Message (Signed)
Important Message  Patient Details  Name: Joseph [name] MRN: [id number] Date of Birth: [birth year]   Medicare Important Message Given:  Yes     Dannette Barbara 12/19/2019, 1:39 PM

## 2019-12-19 NOTE — Telephone Encounter (Signed)
Pt c/o medication issue:  1. Name of Medication: atorvastatin   2. How are you currently taking this medication (dosage and times per day)? 40 mg po q d   3. Are you having a reaction (difficulty breathing--STAT)? No   4. What is your medication issue? Patient was dc today on double the dose 80 mg po q d .  Patient cannot take this as pill is too big .    Patient daughter wants an answer asap as this was not the dose gollan gave the patient previously and the hospitalist may not know what he was supposed to take.

## 2019-12-19 NOTE — Discharge Instructions (Signed)
Heart Attack A heart attack occurs when blood and oxygen supply to the heart is cut off. A heart attack causes damage to the heart that cannot be fixed. A heart attack is also called a myocardial infarction, or MI. If you think you are having a heart attack, do not wait to see if the symptoms will go away. Get medical help right away. What are the causes? This condition may be caused by:  A fatty substance (plaque) in the blood vessels (arteries). This can block the flow of blood to the heart.  A blood clot in the blood vessels that go to the heart. The blood clot blocks blood flow.  Low blood pressure.  An abnormal heartbeat.  Some diseases, such as problems in red blood cells (anemia)orproblems in breathing (respiratory failure).  Tightening (spasm) of a blood vessel that cuts off blood to the heart.  A tear in a blood vessel of the heart.  High blood pressure. What increases the risk? The following factors may make you more likely to develop this condition:  Aging. The older you are, the higher your risk.  Having a personal or family history of chest pain, heart attack, stroke, or narrowing of the arteries in the legs, arms, head, or stomach (peripheral artery disease).  Being male.  Smoking.  Not getting regular exercise.  Being overweight or obese.  Having high blood pressure.  Having high cholesterol.  Having diabetes.  Drinking too much alcohol.  Using illegal drugs, such as cocaine or methamphetamine. What are the signs or symptoms? Symptoms of this condition include:  Chest pain. It may feel like: ? Crushing or squeezing. ? Tightness, pressure, fullness, or heaviness.  Pain in the arm, neck, jaw, back, or upper body.  Shortness of breath.  Heartburn.  Upset stomach (indigestion).  Feeling like you may vomit (nauseous).  Cold sweats.  Feeling tired.  Sudden light-headedness. How is this treated? A heart attack must be treated as soon as  possible. Treatment may include:  Medicines to: ? Break up or dissolve blood clots. ? Thin blood and help prevent blood clots. ? Treat blood pressure. ? Improve blood flow to the heart. ? Reduce pain. ? Reduce cholesterol.  Procedures to widen a blocked artery and keep it open.  Open heart surgery.  Receiving oxygen.  Making your heart strong again (cardiac rehabilitation) through exercise, education, and counseling. Follow these instructions at home: Medicines  Take over-the-counter and prescription medicines only as told by your doctor. You may need to take medicine: ? To keep your blood from clotting too easily. ? To control blood pressure. ? To lower cholesterol. ? To control heart rhythms.  Do not take these medicines unless your doctor says it is okay: ? NSAIDs, such as ibuprofen. ? Supplements that have vitamin A, vitamin E, or both. ? Hormone replacement therapy that has estrogen with or without progestin. Lifestyle      Do not use any products that have nicotine or tobacco, such as cigarettes, e-cigarettes, and chewing tobacco. If you need help quitting, ask your doctor.  Avoid secondhand smoke.  Exercise regularly. Ask your doctor about a cardiac rehab program.  Eat heart-healthy foods. Your doctor will tell you what foods to eat.  Stay at a healthy weight.  Lower your stress level.  Do not use illegal drugs. Alcohol use  Do not drink alcohol if: ? Your doctor tells you not to drink. ? You are pregnant, may be pregnant, or are planning to become pregnant.    If you drink alcohol: ? Limit how much you use to:  0-1 drink a day for women.  0-2 drinks a day for men. ? Know how much alcohol is in your drink. In the [place] one drink equals one 12 oz bottle of beer (355 mL), one 5 oz glass of wine (148 mL), or one 1 oz glass of hard liquor (44 mL). General instructions  Work with your doctor to treat other problems you may have, such as diabetes or high  blood pressure.  Get screened for depression. Get treatment if needed.  Keep your vaccines up to date. Get the flu shot (influenza vaccine) every year.  Keep all follow-up visits as told by your doctor. This is important. Contact a doctor if:  You feel very sad.  You have trouble doing your daily activities. Get help right away if:  You have sudden, unexplained discomfort in your chest, arms, back, neck, jaw, or upper body.  You have shortness of breath.  You have sudden sweating or clammy skin.  You feel like you may vomit.  You vomit.  You feel tired or weak.  You get light-headed or dizzy.  You feel your heart beating fast.  You feel your heart skipping beats.  You have blood pressure that is higher than 180/120. These symptoms may be an emergency. Do not wait to see if the symptoms will go away. Get medical help right away. Call your local emergency services (911 in the [place] Do not drive yourself to the hospital. Summary  A heart attack occurs when blood and oxygen supply to the heart is cut off.  Do not take NSAIDs unless your doctor says it is okay.  Do not smoke. Avoid secondhand smoke.  Exercise regularly. Ask your doctor about a cardiac rehab program. This information is not intended to replace advice given to you by your health care provider. Make sure you discuss any questions you have with your health care provider. Document Revised: 02/18/2019 Document Reviewed: 02/18/2019 Elsevier Patient Education  2020 [employer]  

## 2019-12-20 NOTE — Telephone Encounter (Signed)
Spoke with patient's daughter. As noted in previous entry, duaghter and wife feel patient seems drowsy, slow when processing what they're saying and dazed. He is getting up and walking on his own with minimal assist. Admitted yesterday he thought he was hallucinating by hearing music and seeing shadows/objects out of the corner of his eyes. Duaghter denies facial droop or one-sided weakness. Patient has not been drinking much since discharged home yesterday. Daughter/wife say he has been this way since being in the hospital.  Discussed with Dr Rockey Situ. He feels patient may have sleep deprivation and the weakness and slowness is r/t to his age as well. He advised to continue to monitor patient over the next 24 hours and if no improvement, may need to go to the ER at [facility].  Daughter verbalized understanding of recommendations. She is also aware she can call the oncall for advice over the weekend if needed.

## 2019-12-20 NOTE — Telephone Encounter (Signed)
Patient daughter calling in stating the patient ahs not returned to baseline since procedure on 1/26. Patient states he feels fine but family members are observing drowsiness, in a daze, said yesterday looked like he had a stroke, slurred speech yesterday. Daughter thinks it may be related to [place].   Patient reported hallucinations and hearing noises this morning.

## 2019-12-23 ENCOUNTER — Telehealth: Payer: Self-pay | Admitting: Cardiovascular Disease

## 2019-12-23 NOTE — Telephone Encounter (Signed)
Patient daughter calling Patient just recently got home from the hospital Patient has woken up with a real sharpe pain in side, near hip bone that will not go away The pain is keeping him from walking Wants to check with provider if it will be ok to give an ibuprofen or tylenol along with other heart medications  Please call to discuss

## 2019-12-23 NOTE — Telephone Encounter (Signed)
Pt c/o medication issue:  1. Name of Medication: plavix  2. How are you currently taking this medication (dosage and times per day)? 75 mg po q d   3. Are you having a reaction (difficulty breathing--STAT)? Nose bleeds interim x 2 days   4. What is your medication issue? Bleeding

## 2019-12-23 NOTE — Telephone Encounter (Signed)
Spoke with patients wife per release form. She reports nose bleed x 2 days but not today. She said last one was yesterday morning and it took 15 minutes to get it stopped. Advised that he does have appointment on Wednesday and given his recent cath he needs to stay on this to reduce risk and make sure the stents are patent. She verbalized understanding with no further questions at this time. Confirmed appointment on Wednesday and then again in March here in our office. She was appreciative for the call back with no further questions at this time.

## 2019-12-23 NOTE — Telephone Encounter (Signed)
Spoke with patients daughter and she wonders if this pain could be from his kidney. She also wanted to know if provider had reviewed all of his medications because when he was discharged some of them were changed and new ones added. Reviewed that when they come in on Wednesday to see provider she will review each one and be able to answer any questions. She was comfortable with this and agreeable to keep appointment. She was appreciative for the call back with no further questions at this time.

## 2019-12-24 NOTE — Progress Notes (Signed)
Office Visit    Patient Name: Joseph [name] Date of Encounter: 12/25/2019  Primary Care Provider:  Jerrol [name]., MD Primary Cardiologist:  Joseph Rogue, MD Electrophysiologist:  None   Chief Complaint    Joseph [name] is a 86 y.o. male with a hx of CAD s/p CABG, chronic diastolic heart failure, DM2 on insulin, hyperlipidemia, PAD presents today for follow-up after recent NSTEMI and cardiac catheterization with chief complaint of nosebleed.  Past Medical History    Past Medical History:  Diagnosis Date  . CAD (coronary artery disease)    a. s/p CABG;  b. 07/2012 Cath: 3VD including 80 dLCX (med Rx), 4/4 patent grafts.  . Chronic diastolic CHF (congestive heart failure) ([place])    a. 07/2012 Echo: EF 55-60%, no rwma, Gr 1 DD, mild MR;  04/2014 Echo: EF 55-60%, no rwma, mild MR, mildly dil LA.  . Diabetes mellitus, type 2 ([place])   . HTN (hypertension)   . Hyperlipidemia   . Hypothyroidism   . Lower extremity edema    a. 03/2014 amlodipine d/c'd.  Marland Kitchen Spinal stenosis    Past Surgical History:  Procedure Laterality Date  . APPENDECTOMY    . CARDIAC CATHETERIZATION  07/2012   [facility]/no stents  . CHOLECYSTECTOMY    . CORNEAL TRANSPLANT    . CORONARY ARTERY BYPASS GRAFT  1994  . HEMORRHOID SURGERY    . LEFT HEART CATH AND CORS/GRAFTS ANGIOGRAPHY N/A 12/17/2019   Procedure: LEFT HEART CATH AND CORS/GRAFTS ANGIOGRAPHY;  Surgeon: Joseph Bush, MD;  Location: [facility];  Service: Cardiovascular;  Laterality: N/A;  . PROSTATE BIOPSY    . TOE SURGERY     ingrown toe nail    Allergies  No Known Allergies  History of Present Illness    Joseph [name] is a 86 y.o. male with a hx of CAD s/p CABG, chronic diastolic heart failure, DM2 on insulin, HLD, HTN, PAD, hypothyroidism.  He was last seen while hospitalized.  Seen in the office 12/16/2019 by Dr. Rockey Situ for chest pain.  Outpatient troponin I resulted 450-he proceeded to the ED.  Troponin max 588.   Underwent cardiac cath 12/17/2019 with severe native CAD with 80% distal LMCA, 100% proximal LAD, diffuse LCx/OM disease, 90% mid RCA stenosis and CTO of rPDA present. His LIMA was patent with D3 backfilling of the mid/distal LAD and his SVGx3 were patent. Recommended for up to 12 months of DAPT. Follow-up echo with LVEF 50-55%, mild LVH, grade 1 diastolic dysfunction.  He had persistent AV block and a ZIO-AT monitor was placed, his beta-blocker was discontinued. He was trialed on Imdur, but it was discontinued due to headache.   Present with his daughter today who is visiting from [place]. He lives at home with his wife. Thorough review of his discharge medications today. Thorough review of cardiac catheterization.   He had some hallucinations and drowsiness the day after discharge. Called office and was told likely due to lack of sleep in hospital and anesthesia. This has improved each day. He is A&Ox4 when speaking with me today in the office.   No chest, pain, pressure, tightness. Reports no worsening DOE. No near-syncope nor syncope. He is still wearing the ZIO-AT. His wife has requested to take it off on 12/31/19 instead of 01/01/20 so her daughter can help her mail it and I told them this was acceptable. Quick review of ZIO-AT reports so far with no significant pauses or arrhthymias.  Tells me he had onset of R hip pain 2 days ago. Does not radiate down his leg. Tells me it just "feels like a pain". Took 1000mg  Tylenol with some relief. Nurse from the insurance came to visit yesterday and suggested sciatica, low suspicion as it doesn't radiate. Encouraged him to follow up with PCP at upcoming office visit. Told him he may utilized Tylenol as needed. Would avoid NSAIDS.   Nosebleed on Sunday lasting 52minutes and Monday lasting 5 minutes. Has hx of nose bleed and failed cauterization by ENT. He is agreeable to continue Aspirin and Plavix. Discussed that if we discontinued one it would likely be  Aspirin, but he will call office with recurrent nose bleeds. Recommend saline nasal spray.   EKGs/Labs/Other Studies Reviewed:   The following studies were reviewed today: Cardiac cath 12/17/19 1. Severe native CAD, including 80% distal LMCA, 100% proximal LAD and diffuse LCx/OM disease.  90% mid RCA stenosis and CTO of rPDA also present. 2. Patent LIMA->D3 with backfilling of the mid/distal LADl. 3. Patent SVG-D1-D2, SVG-OM1-OM2, and SVG-rPDA-rPL. 4. Mildly to moderately elevated LVEDP. 5. At least moderately reduced left ventricular systolic function.   Recommendations: 1. Medical therapy; recommend up to 12 months of DAPT with ASA and clopidogrel, as tolerated. 2. Aggressive secondary prevention of coronary artery disease. 3. Obtain echocardiogram to better assess LVEF.Marland Kitchen 4. Monitor on telemetry.  Consider EP consultation of AV block persists, particularly if the patient is symptoms.  Echo 12/17/19 1. Left ventricular ejection fraction, by visual estimation, is 50 to  55%. The left ventricle has low normal function. There is mildly increased  left ventricular hypertrophy.   2. Moderate hypokinesis of the left ventricular, mid anteroseptal wall.   3. Left ventricular diastolic parameters are consistent with Grade I  diastolic dysfunction (impaired relaxation).   4. The left ventricle demonstrates regional wall motion abnormalities.   5. Global right ventricle has mildly reduced systolic function.The right  ventricular size is normal. No increase in right ventricular wall  thickness.   6. Left atrial size was mildly dilated.   7. Right atrial size was normal.   8. The mitral valve is normal in structure. Mild mitral valve  regurgitation. No evidence of mitral stenosis.   9. The tricuspid valve is normal in structure.  10. The tricuspid valve is normal in structure. Tricuspid valve  regurgitation is trivial.  11. The aortic valve is tricuspid. Aortic valve regurgitation is not    visualized. Mild to moderate aortic valve sclerosis/calcification without  any evidence of aortic stenosis.  12. The pulmonic valve was not well visualized. Pulmonic valve  regurgitation is not visualized.  13. Aortic dilatation noted.  14. There is mild dilatation of the aortic root and of the ascending aorta  measuring 38 mm.  15. TR signal is inadequate for assessing pulmonary artery systolic  pressure.  16. The inferior vena cava is normal in size with greater than 50%  respiratory variability, suggesting right atrial pressure of 3 mmHg.  17. The interatrial septum was not well visualized.   EKG:  EKG is  ordered today.  The ekg ordered today demonstrates SB 51 bpm with sinus arrhythmia 51 bpm, left axis deviation, LBBB - no acute ST/T wave changes.   Recent Labs: 12/16/2019: ALT 21 12/17/2019: TSH 3.306 12/18/2019: BUN 15; Creatinine, Ser 1.06; Hemoglobin 13.2; Magnesium 1.5; Platelets 170; Potassium 3.5; Sodium 139  Recent Lipid Panel    Component Value Date/Time   CHOL 121 12/17/2019 0749  CHOL 122 05/14/2018 0824   TRIG 98 12/17/2019 0749   HDL 34 (L) 12/17/2019 0749   HDL 34 (L) 05/14/2018 0824   CHOLHDL 3.6 12/17/2019 0749   VLDL 20 12/17/2019 0749   LDLCALC 67 12/17/2019 0749   LDLCALC 55 05/14/2018 0824    Home Medications   Current Meds  Medication Sig  . aspirin EC 81 MG tablet Take 1 tablet (81 mg total) by mouth daily.  Marland Kitchen atorvastatin (LIPITOR) 80 MG tablet Take 1 tablet (80 mg total) by mouth every evening.  . calcipotriene (DOVONOX) 0.005 % cream Apply topically as needed (to creases).   . Cholecalciferol (VITAMIN D3) 2000 units TABS Take 1 capsule by mouth every evening.   . clobetasol cream (TEMOVATE) AB-123456789 % Apply 1 application topically 2 (two) times daily. As needed to hands and elbows  . clopidogrel (PLAVIX) 75 MG tablet Take 1 tablet (75 mg total) by mouth daily.  . hydrocortisone 2.5 % cream Apply 1 application topically 2 (two) times daily. As  needed to face, crease, groin and butt  . insulin aspart (NOVOLOG) 100 UNIT/ML injection Inject 0-9 Units into the skin 3 (three) times daily with meals. (Patient taking differently: Inject into the skin 3 (three) times daily with meals. 10 am, 15 lunch, 20 pm)  . insulin glargine (LANTUS) 100 UNIT/ML injection Inject 0.68 mLs (68 Units total) into the skin at bedtime. (Patient taking differently: Inject 65 Units into the skin at bedtime. )  . ketoconazole (NIZORAL) 2 % cream Apply 1 application topically 2 (two) times daily as needed for irritation. To groin, fave and buttocks.  Marland Kitchen levothyroxine (SYNTHROID, LEVOTHROID) 50 MCG tablet TAKE 1 TABLET BY MOUTH ONCE DAILY BEFORE BREAKFAST (Patient taking differently: Take 25 mcg by mouth daily before breakfast. )  . losartan (COZAAR) 100 MG tablet Take 1 tablet (100 mg total) by mouth daily. (Patient taking differently: Take 100 mg by mouth at bedtime. )  . Magnesium 250 MG TABS Take 250 mg by mouth as needed (swelling). Take when you take your Torsemide.  . mometasone (ELOCON) 0.1 % ointment Apply topically daily. As needed to scalp and ears  . nitroGLYCERIN (NITROSTAT) 0.4 MG SL tablet Place 1 tablet (0.4 mg total) under the tongue every 5 (five) minutes as needed for chest pain.  Marland Kitchen omeprazole (PRILOSEC) 20 MG capsule Take 1 capsule by mouth once daily (Patient taking differently: Take 20 mg by mouth at bedtime. )  . potassium chloride (KLOR-CON) 10 MEQ tablet Take 1 tablet (10 mEq total) by mouth daily.  Marland Kitchen torsemide (DEMADEX) 20 MG tablet Take 1 tablet (20 mg total) by mouth as needed (As needed for swelling and shortness of breath). Also take extra potassium 10 meq with this.  . vitamin C (ASCORBIC ACID) 500 MG tablet Take 500 mg by mouth at bedtime.   . [DISCONTINUED] potassium chloride (KLOR-CON) 10 MEQ tablet Take 1 tablet (10 mEq total) by mouth daily. Take 1 tablet (10 meq) daily    Review of Systems   Review of Systems  Constitution: Negative  for chills, fever and malaise/fatigue.  HENT: Positive for nosebleeds.   Cardiovascular: Positive for dyspnea on exertion. Negative for chest pain, leg swelling, near-syncope, orthopnea, palpitations and syncope.  Respiratory: Negative for cough, shortness of breath and wheezing.   Musculoskeletal: Positive for joint pain.       (+) R hip pain  Gastrointestinal: Negative for nausea and vomiting.  Neurological: Negative for dizziness, light-headedness and weakness.   All  other systems reviewed and are otherwise negative except as noted above.  Physical Exam    VS:  BP (!) 124/48 (BP Location: Right Arm, Patient Position: Sitting, Cuff Size: Normal)   Pulse (!) 51   Ht 5\' 7"  (1.702 m)   Wt 205 lb (93 kg)   SpO2 98%   BMI 32.11 kg/m  , BMI Body mass index is 32.11 kg/m. GEN: Well nourished, overweight, well developed, in no acute distress. HEENT: normal. Neck: Supple, no JVD, carotid bruits, or masses. Cardiac: RRR, bradycardic, no murmurs, rubs, or gallops. No clubbing, cyanosis, edema.  Radials/PT 2+ and equal bilaterally.  Respiratory:  Respirations regular and unlabored, clear to auscultation bilaterally. GI: Soft, nontender, nondistended. MS: No deformity or atrophy. Skin: Warm and dry, no rash. Neuro:  Strength and sensation are intact. Psych: Normal affect.  Accessory Clinical Findings    ECG personally reviewed by me today - SB 51 bpm with sinus arrhythmia 51 bpm, left axis deviation, LBBB - no acute changes.  Assessment & Plan    1. NSTEMI/CAD s/p CABG - Recent NSTEMI 12/16/19 with cath showing severe native vessel disease and patent grafts. L radial cath site healing appropriately.   Recommended for 12 months of DAPT. Nosebleed Sunday and Monday, but not since. Reports hx of nose bleed. Agreeable to continue aspirin and Plavix. If recurrent nose bleed likely refer to ENT and/or discontinue aspirin. Recommend saline nasal spray.   Intolerant of Imdur with headache. We  discussed the possibility of additional antianginal therapy such as Ranexa if symptoms persist. No anginal symptoms at this time.   2. Mobitz 1 AV block - Overall asymptomatic. No lightheadedness, dizziness, near-syncope. Beta blocker being held. ZIO monitor in place to rule out episodes of higher degree AV block. EKG today SB 51 bpm with sinus arrthymia not on any rate-limiting agents.   3. Atrial tachycardia - Noted in office prior to admission 12/16/19. Poor candidate for beta blocker. Await result of ZIO. He was given permission to remove ZIO monitor 12/31/19 instead of 01/01/20 as his daughter would be able to assist prior to returning to her home in [place].  4. HTN - BP well controlled. Continue Losartan 100mg  daily. Continue to monitor at home with goal of BP consistently <130/80.  5. HLD, LDL goal <70 - 12/17/19 LDL at goal of <70. Continue Atorvastatin 80mg  daily.   6. GERD - Omeprazole changed to daily instead of every other day while hospitalized. Recommend continuing daily for prevention of GERD symptoms as these are easily mistaken for cardiac symptoms.   7. Hypothyroidism - Levothyroxine dose changed while hospitalized. He will follow up with his PCP.  8. Hypokalemia - Continue KDur 55meq daily. BMET today.  9. Chronic diastolic heart failure - Echo 12/17/19 LVEF 50-55%, mild LVH, gr1DD, mild MR. Continue ARB. No beta blocker secondary to bradycardia. Continue Torsemide 20mg  as needed. On days he takes his Torsemide he will take an additional KDur 26mEq and a magnesium.   10. R hip pain - Onset 2 days ago. Comes and goes. Gets better with 1000mg  Tylenol. Encouraged to follow up with PCP.   Disposition: Follow up in 1 month(s) with Dr. Rockey Situ.   Loel Dubonnet, NP 12/25/2019, 12:26 PM

## 2019-12-25 ENCOUNTER — Ambulatory Visit (INDEPENDENT_AMBULATORY_CARE_PROVIDER_SITE_OTHER): Payer: PPO | Admitting: Family

## 2019-12-25 ENCOUNTER — Other Ambulatory Visit: Payer: Self-pay

## 2019-12-25 ENCOUNTER — Encounter: Payer: Self-pay | Admitting: Family

## 2019-12-25 VITALS — BP 124/48 | HR 51 | Ht 67.0 in | Wt 205.0 lb

## 2019-12-25 DIAGNOSIS — E785 Hyperlipidemia, unspecified: Secondary | ICD-10-CM

## 2019-12-25 DIAGNOSIS — K219 Gastro-esophageal reflux disease without esophagitis: Secondary | ICD-10-CM

## 2019-12-25 DIAGNOSIS — I25118 Atherosclerotic heart disease of native coronary artery with other forms of angina pectoris: Secondary | ICD-10-CM | POA: Diagnosis not present

## 2019-12-25 DIAGNOSIS — I214 Non-ST elevation (NSTEMI) myocardial infarction: Secondary | ICD-10-CM | POA: Diagnosis not present

## 2019-12-25 DIAGNOSIS — I441 Atrioventricular block, second degree: Secondary | ICD-10-CM

## 2019-12-25 DIAGNOSIS — I10 Essential (primary) hypertension: Secondary | ICD-10-CM

## 2019-12-25 MED ORDER — POTASSIUM CHLORIDE ER 10 MEQ PO TBCR: 10.0000 meq | EXTENDED_RELEASE_TABLET | Freq: Every day | ORAL | 1 refills | Status: DC

## 2019-12-25 NOTE — Patient Instructions (Signed)
Medication Instructions:  Your physician has recommended you make the following change in your medication:   Take your magnesium only on days you take your torsemide.  *If you need a refill on your cardiac medications before your next appointment, please call your pharmacy*  Lab Work: Your physician recommends that you return for lab work today: BMET  If you have labs (blood work) drawn today and your tests are completely normal, you will receive your results only by: Marland Kitchen MyChart Message (if you have MyChart) OR . A paper copy in the mail If you have any lab test that is abnormal or we need to change your treatment, we will call you to review the results.  Testing/Procedures: You had an EKG today. It showed sinus bradycardia.   Follow-Up: At [facility], you and your health needs are our priority.  As part of our continuing mission to provide you with exceptional heart care, we have created designated Provider Care Teams.  These Care Teams include your primary Cardiologist (physician) and Advanced Practice Providers (APPs -  Physician Assistants and Nurse Practitioners) who all work together to provide you with the care you need, when you need it.  Your next appointment:   1 month(s)  The format for your next appointment:   In Person  Provider:   Ida Rogue, MD  Other Instructions   We will continue your aspirin and Plavix. If you have recurrent nose bleeds, please call us. We would consider holding the aspirin at that point. Would recommend saline nasal spray.    Your catheterization site looks great!   Recommend Tylenol for your hip pain. Labs today will show [place] your kidney function. You may take 1000mg  up to four times per day. Would recommend discussing with Dr. Rosanna Randy at your upcoming office visit.

## 2019-12-26 ENCOUNTER — Ambulatory Visit (INDEPENDENT_AMBULATORY_CARE_PROVIDER_SITE_OTHER): Payer: PPO | Admitting: Family Medicine

## 2019-12-26 ENCOUNTER — Encounter: Payer: Self-pay | Admitting: Family Medicine

## 2019-12-26 VITALS — BP 152/77 | HR 50 | Temp 96.9°F | Wt 205.4 lb

## 2019-12-26 DIAGNOSIS — R109 Unspecified abdominal pain: Secondary | ICD-10-CM

## 2019-12-26 DIAGNOSIS — Z6832 Body mass index (BMI) 32.0-32.9, adult: Secondary | ICD-10-CM

## 2019-12-26 DIAGNOSIS — Z794 Long term (current) use of insulin: Secondary | ICD-10-CM

## 2019-12-26 DIAGNOSIS — I25708 Atherosclerosis of coronary artery bypass graft(s), unspecified, with other forms of angina pectoris: Secondary | ICD-10-CM

## 2019-12-26 DIAGNOSIS — E6609 Other obesity due to excess calories: Secondary | ICD-10-CM

## 2019-12-26 DIAGNOSIS — I441 Atrioventricular block, second degree: Secondary | ICD-10-CM | POA: Diagnosis not present

## 2019-12-26 DIAGNOSIS — L409 Psoriasis, unspecified: Secondary | ICD-10-CM

## 2019-12-26 DIAGNOSIS — E1151 Type 2 diabetes mellitus with diabetic peripheral angiopathy without gangrene: Secondary | ICD-10-CM

## 2019-12-26 DIAGNOSIS — I10 Essential (primary) hypertension: Secondary | ICD-10-CM | POA: Diagnosis not present

## 2019-12-26 DIAGNOSIS — B372 Candidiasis of skin and nail: Secondary | ICD-10-CM | POA: Diagnosis not present

## 2019-12-26 LAB — POCT URINALYSIS DIPSTICK
Bilirubin, UA: NEGATIVE
Blood, UA: NEGATIVE
Glucose, UA: NEGATIVE
Ketones, UA: NEGATIVE
Leukocytes, UA: NEGATIVE
Nitrite, UA: NEGATIVE
Protein, UA: POSITIVE — AB
Spec Grav, UA: 1.025 (ref 1.010–1.025)
Urobilinogen, UA: 0.2 E.U./dL
pH, UA: 5 (ref 5.0–8.0)

## 2019-12-26 LAB — BASIC METABOLIC PANEL
BUN/Creatinine Ratio: 14 (ref 10–24)
BUN: 14 mg/dL (ref 10–36)
CO2: 24 mmol/L (ref 20–29)
Calcium: 9.1 mg/dL (ref 8.6–10.2)
Chloride: 105 mmol/L (ref 96–106)
Creatinine, Ser: 0.98 mg/dL (ref 0.76–1.27)
GFR calc Af Amer: 77 mL/min/{1.73_m2} (ref >59)
GFR calc non Af Amer: 67 mL/min/{1.73_m2} (ref >59)
Glucose: 218 mg/dL — ABNORMAL HIGH (ref 65–99)
Potassium: 5 mmol/L (ref 3.5–5.2)
Sodium: 138 mmol/L (ref 134–144)

## 2019-12-26 MED ORDER — HYDROCORTISONE 2.5 % EX CREA: 1.0000 "application " | TOPICAL_CREAM | Freq: Two times a day (BID) | CUTANEOUS | 2 refills | Status: DC

## 2019-12-26 MED ORDER — CALCIPOTRIENE 0.005 % EX CREA: TOPICAL_CREAM | CUTANEOUS | 2 refills | Status: DC | PRN

## 2019-12-26 MED ORDER — KETOCONAZOLE 2 % EX CREA: 1.0000 "application " | TOPICAL_CREAM | Freq: Two times a day (BID) | CUTANEOUS | 2 refills | Status: DC | PRN

## 2019-12-26 NOTE — Progress Notes (Signed)
Patient: Joseph [name] Male    DOB: 1925-09-17   86 y.o.   MRN: [id number] Visit Date: 12/26/2019  Today's Provider: Wilhemena Durie, MD   Chief Complaint  Patient presents with  . Hospitalization Follow-up   Subjective:     HPI   Patient has been having some pain on the right side for 3 days and it makes it hard for patient to walk. Had some tests done yesterday and would like to discuss.  Patient's daughter comes in with him.  He is status post CABG 1997.  Cardiac cath on 12/17/2019 shows severe native CAD disease with no obvious lesions that could be intervened upon.  His EF was 35 to 45%.  Recommended maximal medical therapy.   Follow up Hospitalization  Patient was admitted to [facility] on 12/16/2019 and discharged on 12/19/2019. He was treated for Chest Pain. Treatment for this included Patient admitted for NSTEMI. Telephone follow up was done on n/a He reports good compliance with treatment. He reports this condition is Improved.  ------------------------------------------------------------------------------------ He was having right lower back/right flank pain which came around to the right pelvic region.  Did not go into the groin.  No hematuria or urinary symptoms.  It is not with with weightbearing. This is all better today. No  Fever or chills or bowel issues.    No Known Allergies   Current Outpatient Medications:  .  aspirin EC 81 MG tablet, Take 1 tablet (81 mg total) by mouth daily., Disp: 90 tablet, Rfl: 3 .  atorvastatin (LIPITOR) 80 MG tablet, Take 1 tablet (80 mg total) by mouth every evening., Disp: 30 tablet, Rfl: 0 .  calcipotriene (DOVONOX) 0.005 % cream, Apply topically as needed (to creases). , Disp: , Rfl:  .  Cholecalciferol (VITAMIN D3) 2000 units TABS, Take 1 capsule by mouth every evening. , Disp: , Rfl:  .  clopidogrel (PLAVIX) 75 MG tablet, Take 1 tablet (75 mg total) by mouth daily., Disp: 30 tablet, Rfl: 1 .  hydrocortisone 2.5 %  cream, Apply 1 application topically 2 (two) times daily. As needed to face, crease, groin and butt, Disp: , Rfl:  .  insulin aspart (NOVOLOG) 100 UNIT/ML injection, Inject 0-9 Units into the skin 3 (three) times daily with meals. (Patient taking differently: Inject into the skin 3 (three) times daily with meals. 10 am, 15 lunch, 20 pm), Disp: 10 mL, Rfl: 0 .  insulin glargine (LANTUS) 100 UNIT/ML injection, Inject 0.68 mLs (68 Units total) into the skin at bedtime. (Patient taking differently: Inject 65 Units into the skin at bedtime. ), Disp: 10 mL, Rfl: 0 .  ketoconazole (NIZORAL) 2 % cream, Apply 1 application topically 2 (two) times daily as needed for irritation. To groin, fave and buttocks., Disp: , Rfl:  .  levothyroxine (SYNTHROID, LEVOTHROID) 50 MCG tablet, TAKE 1 TABLET BY MOUTH ONCE DAILY BEFORE BREAKFAST (Patient taking differently: Take 25 mcg by mouth daily before breakfast. ), Disp: 90 tablet, Rfl: 3 .  losartan (COZAAR) 100 MG tablet, Take 1 tablet (100 mg total) by mouth daily. (Patient taking differently: Take 100 mg by mouth at bedtime. ), Disp: 90 tablet, Rfl: 3 .  Magnesium 250 MG TABS, Take 250 mg by mouth as needed (swelling). Take when you take your Torsemide., Disp: , Rfl:  .  mometasone (ELOCON) 0.1 % ointment, Apply topically daily. As needed to scalp and ears, Disp: , Rfl:  .  nitroGLYCERIN (NITROSTAT) 0.4 MG SL tablet,  Place 1 tablet (0.4 mg total) under the tongue every 5 (five) minutes as needed for chest pain., Disp: 25 tablet, Rfl: 3 .  omeprazole (PRILOSEC) 20 MG capsule, Take 1 capsule by mouth once daily (Patient taking differently: Take 20 mg by mouth at bedtime. ), Disp: 90 capsule, Rfl: 3 .  potassium chloride (KLOR-CON) 10 MEQ tablet, Take 1 tablet (10 mEq total) by mouth daily., Disp: 90 tablet, Rfl: 1 .  torsemide (DEMADEX) 20 MG tablet, Take 1 tablet (20 mg total) by mouth as needed (As needed for swelling and shortness of breath). Also take extra potassium 10  meq with this., Disp: 30 tablet, Rfl: 0 .  vitamin C (ASCORBIC ACID) 500 MG tablet, Take 500 mg by mouth at bedtime. , Disp: , Rfl:  .  clobetasol cream (TEMOVATE) AB-123456789 %, Apply 1 application topically 2 (two) times daily. As needed to hands and elbows, Disp: , Rfl:   Review of Systems  Constitutional: Negative for appetite change, chills and fever.  HENT: Negative.   Eyes: Negative.   Respiratory: Negative for chest tightness, shortness of breath and wheezing.   Cardiovascular: Negative for chest pain and palpitations.  Gastrointestinal: Negative for abdominal pain, nausea and vomiting.  Endocrine: Negative.   Musculoskeletal: Positive for arthralgias.  Allergic/Immunologic: Negative.   Hematological: Negative.   Psychiatric/Behavioral: Negative.     Social History   Tobacco Use  . Smoking status: Former Smoker    Packs/day: 0.00    Years: 6.00    Pack years: 0.00    Types: Pipe, Cigars    Quit date: 12/23/1980    Years since quitting: 39.0  . Smokeless tobacco: Never Used  Substance Use Topics  . Alcohol use: No      Objective:   BP (!) 152/77 (BP Location: Left Arm, Patient Position: Sitting, Cuff Size: Large)   Pulse (!) 50   Temp (!) 96.9 F (36.1 C) (Temporal)   Wt 205 lb 6.4 oz (93.2 kg)   SpO2 97%   BMI 32.17 kg/m  Vitals:   12/26/19 1611  BP: (!) 152/77  Pulse: (!) 50  Temp: (!) 96.9 F (36.1 C)  TempSrc: Temporal  SpO2: 97%  Weight: 205 lb 6.4 oz (93.2 kg)  Body mass index is 32.17 kg/m.   Physical Exam Vitals reviewed.  Constitutional:      Appearance: He is well-developed. He is obese.  HENT:     Head: Normocephalic and atraumatic.     Right Ear: External ear normal.     Left Ear: External ear normal.     Nose: Nose normal.  Eyes:     General: No scleral icterus.    Conjunctiva/sclera: Conjunctivae normal.  Neck:     Thyroid: No thyromegaly.  Cardiovascular:     Rate and Rhythm: Normal rate and regular rhythm.     Heart sounds: Normal  heart sounds.  Pulmonary:     Effort: Pulmonary effort is normal.     Breath sounds: Normal breath sounds.  Abdominal:     Palpations: Abdomen is soft.  Musculoskeletal:     Comments: 2+ edema.  Skin:    General: Skin is warm and dry.  Neurological:     General: No focal deficit present.     Mental Status: He is alert and oriented to person, place, and time.  Psychiatric:        Mood and Affect: Mood normal.        Behavior: Behavior normal.  Thought Content: Thought content normal.        Judgment: Judgment normal.      No results found for any visits on 12/26/19.     Assessment & Plan    1. Atherosclerosis of coronary artery bypass graft of native heart with stable angina pectoris ([place]) End-stage disease, medical management only.  Patient has living well but discussed with the patient and his daughter that it might be a good idea to get a DNR to put up in his house.  He is not ready for this.  He is going to discuss this with his family. Maximum medical management 2. AV block, Mobitz 1 Followed by cardiology  3. Essential hypertension   4. Class 1 obesity due to excess calories with serious comorbidity and body mass index (BMI) of 32.0 to 32.9 in adult With heart disease and hypertension  5. Psoriasis  - hydrocortisone 2.5 % cream; Apply 1 application topically 2 (two) times daily. As needed to face, crease, groin and butt  Dispense: 30 g; Refill: 2 - calcipotriene (DOVONOX) 0.005 % cream; Apply topically as needed (to creases).  Dispense: 60 g; Refill: 2  6. Yeast dermatitis  - ketoconazole (NIZORAL) 2 % cream; Apply 1 application topically 2 (two) times daily as needed for irritation. To groin, fave and buttocks.  Dispense: 15 g; Refill: 2  7. Flank pain Resolved.  Will not work this up unless it recurs. - POCT urinalysis dipstick - Urine Culture  8. Type 2 diabetes mellitus with diabetic peripheral angiopathy without gangrene, with long-term current use  of insulin ([place])      Wilhemena Durie, MD  [facility]

## 2019-12-28 LAB — URINE CULTURE

## 2019-12-30 ENCOUNTER — Ambulatory Visit: Payer: PPO | Admitting: Family

## 2019-12-30 ENCOUNTER — Telehealth: Payer: Self-pay

## 2019-12-30 ENCOUNTER — Telehealth: Payer: Self-pay | Admitting: Cardiovascular Disease

## 2019-12-30 NOTE — Telephone Encounter (Signed)
Left voicemail message for patient to call back.

## 2019-12-30 NOTE — Telephone Encounter (Signed)
Patient advised on lab results. 

## 2019-12-30 NOTE — Telephone Encounter (Signed)
Patient daughter calling in wanting to know when patient is allowed to drive. Patient had a heart cath on 2/2 and went home on 2/4.  Please advise patient at home phone

## 2019-12-30 NOTE — Telephone Encounter (Signed)
Spoke with patients daughter per release form and she wanted to confirm when he would be able to resume driving. Reviewed that it is not until his follow up. She requested that I reach out and confirm appointment and driving restrictions with him. Advised that I would be happy to do that. She had no further questions at this time.

## 2019-12-31 NOTE — Telephone Encounter (Signed)
Spoke with patient to confirm his upcoming appointment and that he should not drive. He verbalized understanding with no further questions at this time and confirmed scheduled appointment. He was appreciative for the call with no other needs at this time.

## 2020-01-06 ENCOUNTER — Telehealth: Payer: Self-pay

## 2020-01-06 NOTE — Telephone Encounter (Signed)
Copied from [place] [id number]. Topic: General - Inquiry >> Jan 06, 2020  3:07 PM Lennox Solders wrote: Reason for CRM: pt is calling and would like dr Rosanna Randy or his nurse to call him back concerning a discussion that he does not want to resuscitated if he is not breathing if he were to have another heart attack

## 2020-01-07 NOTE — Telephone Encounter (Signed)
Please advise 

## 2020-01-13 ENCOUNTER — Other Ambulatory Visit: Payer: Self-pay | Admitting: Family Medicine

## 2020-01-13 DIAGNOSIS — E1151 Type 2 diabetes mellitus with diabetic peripheral angiopathy without gangrene: Secondary | ICD-10-CM

## 2020-01-13 DIAGNOSIS — E1165 Type 2 diabetes mellitus with hyperglycemia: Secondary | ICD-10-CM | POA: Diagnosis not present

## 2020-01-15 ENCOUNTER — Other Ambulatory Visit: Payer: Self-pay

## 2020-01-16 ENCOUNTER — Other Ambulatory Visit: Payer: Self-pay | Admitting: Family Medicine

## 2020-01-16 DIAGNOSIS — E1151 Type 2 diabetes mellitus with diabetic peripheral angiopathy without gangrene: Secondary | ICD-10-CM

## 2020-01-16 NOTE — Telephone Encounter (Signed)
Patient called in checking on status of refill. Please advise.

## 2020-01-16 NOTE — Telephone Encounter (Signed)
Requested medication (s) are due for refill today: no  Requested medication (s) are on the active medication list: no  Last refill:  Medication not on current med list, discontinued on 12/16/19  Future visit scheduled: yes  Notes to clinic: Requested prescription discontinued on 12/16/19.     Requested Prescriptions  Pending Prescriptions Disp Refills   Continuous Blood Gluc Sensor (FREESTYLE LIBRE 14 DAY SENSOR) MISC [Pharmacy Med Name: FREESTYLE LIBRE SENSOR 14D KIT] 2 each 0    Sig: USE AS DIRECTED EVERY 53 DAYS      Endocrinology: Diabetes - Testing Supplies Passed - 01/16/2020  9:49 AM      Passed - Valid encounter within last 12 months    Recent Outpatient Visits           3 weeks ago Atherosclerosis of coronary artery bypass graft of native heart with stable angina pectoris [facility])   [facility] [name]., MD   2 months ago Pain of left deltoid   [facility] Crew[facility], MD   2 months ago Left arm pain   [facility] [name]., MD   3 months ago Acute bilateral low back pain without sciatica   [facility] [name]., MD   1 year ago Atherosclerosis of coronary artery bypass graft of native heart with stable angina pectoris [facility])   [facility] [name]., MD       Future Appointments             In 4 days Gollan, Kathlene November, MD [facility]   In 2 weeks  [facility], [place]   In 3 weeks [name]., MD [facility], [place]

## 2020-01-19 NOTE — Progress Notes (Signed)
Cardiology Office Note  Date:  01/20/2020   ID:  Joseph [name], DOB [birth year], MRN [id number]  PCP:  Joseph [name]., MD   Chief Complaint  Patient presents with  . office visit    Pt has no complaints. Meds verbally reviewed w/ pt.    HPI:  Joseph [name] is a 86 year old gentleman with a hx of  CAD, CABG in 1994,  PAD ,followed by Joseph [name],  obesity,  hyperlipidemia,  arthritis of the knees,   fatigue, SOB, leg weakness,  lower extremity edema,  Carotid u/s  Less than 39% stenosis bilaterally presenting for routine followup of his coronary artery disease and  chronic diastolic CHF.   -cath 12/17/2019 results reviewed with him in detail 1. Severe native CAD, including 80% distal LMCA, 100% proximal LAD and diffuse LCx/OM disease. 90% mid RCA stenosis and CTO of rPDA also present. 2. Patent LIMA->D3 with backfilling of the mid/distal LADl. 3. Patent SVG-D1-D2, SVG-OM1-OM2, and SVG-rPDA-rPL. 4. Mildly to moderately elevated LVEDP. 5. At least moderately reduced left ventricular systolic function.   1. Medical therapy; recommend up to 12 months of DAPT with ASA and clopidogrel, as tolerated. 2. Aggressive secondary prevention of coronary artery disease. 3. Obtain echocardiogram to better assess LVEF.Marland Kitchen 4. Monitor on telemetry. Consider EP consultation of AV block persists, particularly if the patient is symptoms.   Echo 12/17/2019 1. Left ventricular ejection fraction, by visual estimation, is 50 to  55%. The left ventricle has low normal function. There is mildly increased  left ventricular hypertrophy.  No more chest pain Has NTG  No regular exercise program,  Able to walk long distance into the office today but needing wheelchair back to his car  Lab work reviewed Hemoglobin A1c 8.5  Glucose running 213 normal kidney function good electrolytes January 2020  EKG personally reviewed by myself on todays visit NSR , paroxysmal tachycardia rate 100   PVCs  Lab work reviewed with him HBA1C 8 in June 2019 Total chol 133, LDL 50  Other past medical history reviewed  headache 02/03/2017,  Seen in th ER for H/A 02/03/17 Confused,  In the emergency room had CT head, Showing atherosclerotic calcification of the cavernous carotid arteries bilaterally. Lab work within normal limits No other focal neurologic deficits Patient and family thinks he had a TIA  admission May 2017 for acute cholecystitis, developing acute respiratory distress with hypoxia and acute renal disease, peak creatinine of 2. Notes indicating HIDA scan positive for acute cholecystitis. s/p cholecystostomy tube placed in through interventional radiology 03/24/16 Diuretic was discontinued in the setting of acute renal failure He does report greater than 20 pound weight loss  Echocardiogram 03/22/2016 reviewed with him, ejection fraction 50-55% Chest x-ray 03/29/2016 with small right pleural effusion  Ultrasound of the legs showing patent right SFA stent, left side CFA and popliteal artery with good flow, waveform deterioration around the ankle on the left  Last Cardiac catheterization was performed at [facility] 08/16/2012 Catheterization showed severe three-vessel coronary artery disease with patent grafts x4. There was 80% disease of a small to moderate sized distal left circumflex. Ejection fraction 45-50%. No significant aortic valve stenosis or mitral valve regurgitation. The findings were discussed with interventional cardiology and medical management was recommended for the distal left circumflex disease.  previous Echocardiogram did not show elevated right-sided pressures and ejection fraction was normal. Previous episode of tachycardia in June 2013 that resolved without intervention  PMH:   has a past medical history of CAD (coronary  artery disease), Chronic diastolic CHF (congestive heart failure) ([place]), Diabetes mellitus, type 2 ([place]), HTN (hypertension),  Hyperlipidemia, Hypothyroidism, Lower extremity edema, and Spinal stenosis.  PSH:    Past Surgical History:  Procedure Laterality Date  . APPENDECTOMY    . CARDIAC CATHETERIZATION  07/2012   [facility]/no stents  . CHOLECYSTECTOMY    . CORNEAL TRANSPLANT    . CORONARY ARTERY BYPASS GRAFT  1994  . HEMORRHOID SURGERY    . LEFT HEART CATH AND CORS/GRAFTS ANGIOGRAPHY N/A 12/17/2019   Procedure: LEFT HEART CATH AND CORS/GRAFTS ANGIOGRAPHY;  Surgeon: Joseph Bush, MD;  Location: [facility];  Service: Cardiovascular;  Laterality: N/A;  . PROSTATE BIOPSY    . TOE SURGERY     ingrown toe nail   Current Outpatient Medications on File Prior to Visit  Medication Sig Dispense Refill  . aspirin EC 81 MG tablet Take 1 tablet (81 mg total) by mouth daily. 90 tablet 3  . atorvastatin (LIPITOR) 80 MG tablet Take 1 tablet (80 mg total) by mouth every evening. 30 tablet 0  . calcipotriene (DOVONOX) 0.005 % cream Apply topically as needed (to creases). 60 g 2  . Cholecalciferol (VITAMIN D3) 2000 units TABS Take 1 capsule by mouth every evening.     . clobetasol cream (TEMOVATE) [id number] % Apply 1 application topically 2 (two) times daily. As needed to hands and elbows    . clopidogrel (PLAVIX) 75 MG tablet Take 1 tablet (75 mg total) by mouth daily. 30 tablet 1  . Continuous Blood Gluc Sensor (FREESTYLE LIBRE 14 DAY SENSOR) MISC USE AS DIRECTED EVERY 14 DAYS 2 each 0  . hydrocortisone 2.5 % cream Apply 1 application topically 2 (two) times daily. As needed to face, crease, groin and butt 30 g 2  . insulin aspart (NOVOLOG) 100 UNIT/ML injection Inject 0-9 Units into the skin 3 (three) times daily with meals. (Patient taking differently: Inject into the skin 3 (three) times daily with meals. 10 am, 15 lunch, 20 pm) 10 mL 0  . insulin glargine (LANTUS) 100 UNIT/ML injection Inject 0.68 mLs (68 Units total) into the skin at bedtime. (Patient taking differently: Inject 65 Units into the skin at bedtime. ) 10 mL  0  . ketoconazole (NIZORAL) 2 % cream Apply 1 application topically 2 (two) times daily as needed for irritation. To groin, fave and buttocks. 15 g 2  . levothyroxine (SYNTHROID, LEVOTHROID) 50 MCG tablet TAKE 1 TABLET BY MOUTH ONCE DAILY BEFORE BREAKFAST (Patient taking differently: Take 25 mcg by mouth daily before breakfast. ) 90 tablet 3  . losartan (COZAAR) 100 MG tablet Take 1 tablet (100 mg total) by mouth daily. (Patient taking differently: Take 100 mg by mouth at bedtime. ) 90 tablet 3  . Magnesium 250 MG TABS Take 250 mg by mouth as needed (swelling). Take when you take your Torsemide.    . mometasone (ELOCON) 0.1 % ointment Apply topically daily. As needed to scalp and ears    . nitroGLYCERIN (NITROSTAT) 0.4 MG SL tablet Place 1 tablet (0.4 mg total) under the tongue every 5 (five) minutes as needed for chest pain. 25 tablet 3  . omeprazole (PRILOSEC) 20 MG capsule Take 1 capsule by mouth once daily (Patient taking differently: Take 20 mg by mouth at bedtime. ) 90 capsule 3  . potassium chloride (KLOR-CON) 10 MEQ tablet Take 1 tablet (10 mEq total) by mouth daily. 90 tablet 1  . torsemide (DEMADEX) 20 MG tablet Take 1 tablet (20  mg total) by mouth as needed (As needed for swelling and shortness of breath). Also take extra potassium 10 meq with this. 30 tablet 0  . vitamin C (ASCORBIC ACID) 500 MG tablet Take 500 mg by mouth at bedtime.      No current facility-administered medications on file prior to visit.    Allergies:   Patient has no known allergies.   Social History:  The patient  reports that he quit smoking about 39 years ago. His smoking use included pipe and cigars. He smoked 0.00 packs per day for 6.00 years. He has never used smokeless tobacco. He reports that he does not drink alcohol or use drugs.   Family History:   family history includes Heart attack in his father; Heart failure in his maternal grandfather, maternal uncle, and mother; Kidney Stones in his son; Sleep  apnea in his daughter and son; Stroke in his brother.    Review of Systems: Review of Systems  Constitutional: Negative.        Gait instability  HENT: Negative.   Respiratory: Negative.   Cardiovascular: Positive for chest pain.  Gastrointestinal: Positive for abdominal pain.  Musculoskeletal: Negative.   Neurological: Negative.   Psychiatric/Behavioral: Negative.   All other systems reviewed and are negative.   PHYSICAL EXAM: VS:  BP (!) 150/62 (BP Location: Left Arm, Patient Position: Sitting, Cuff Size: Normal)   Pulse (!) 103   Ht 5\' 4"  (1.626 m)   Wt 207 lb 8 oz (94.1 kg)   SpO2 96%   BMI 35.62 kg/m  , BMI Body mass index is 35.62 kg/m. Constitutional:  oriented to person, place, and time. No distress.  HENT:  Head: Grossly normal Eyes:  no discharge. No scleral icterus.  Neck: No JVD, no carotid bruits  Cardiovascular: Regular rate and rhythm, no murmurs appreciated Pulmonary/Chest: Clear to auscultation bilaterally, no wheezes or rails Abdominal: Soft.  no distension.  no tenderness.  Musculoskeletal: Normal range of motion Neurological:  normal muscle tone. Coordination normal. No atrophy Skin: Skin warm and dry Psychiatric: normal affect, pleasant  Recent Labs: 12/16/2019: ALT 21 12/17/2019: TSH 3.306 12/18/2019: Hemoglobin 13.2; Magnesium 1.5; Platelets 170 12/25/2019: BUN 14; Creatinine, Ser 0.98; Potassium 5.0; Sodium 138    Lipid Panel Lab Results  Component Value Date   CHOL 121 12/17/2019   HDL 34 (L) 12/17/2019   LDLCALC 67 12/17/2019   TRIG 98 12/17/2019      Wt Readings from Last 3 Encounters:  01/20/20 207 lb 8 oz (94.1 kg)  12/26/19 205 lb 6.4 oz (93.2 kg)  12/25/19 205 lb (93 kg)     ASSESSMENT AND PLAN:   Mixed hyperlipidemia -  Cholesterol is at goal on the current lipid regimen. No changes to the medications were made.  HYPERTENSION, BENIGN -  Off b-blocker, noted to have AV block Monitor reviewed, avg rate 60 bpm  Atrial  tachycardia Having no sx,  No significant tachycardia Beta-blocker held for AV block  Atherosclerosis of coronary artery bypass graft of native heart with stable angina pectoris ([place]) -  Recent cath, results discussed with him, patent grafts Denies any further episodes of chest pain He will take nitroglycerin as needed Cholesterol at goal  Diabetes mellitus with peripheral vascular disease ([place]) -  We have encouraged continued exercise, careful diet management in an effort to lose weight. Followed by endocrine 99991111 8.4  Diastolic CHF, acute on chronic (HCC) -  Euvolemic, stay on torsemide  SOB (shortness of breath) -  Secondary to fluid, deconditioning, obesity Previously we have recommended walking program weight loss  Morbid obesity due to excess calories ([facility]) -  Recommend calorie restriction walking program  Bilateral leg edema -  Symptoms well controlled on today's visit  PAD (peripheral artery disease) ([place]) -  Cholesterol is at goal on the current lipid regimen. No changes to the medications were made. Diabetes avove goal   Total encounter time more than 25 minutes  Greater than 50% was spent in counseling and coordination of care with the patient  Disposition:   F/U 12 months   Orders Placed This Encounter  Procedures  . EKG 12-Lead     Signed, Esmond Plants, M.D., Ph.D. 01/20/2020  [place], [place]

## 2020-01-20 ENCOUNTER — Other Ambulatory Visit: Payer: Self-pay

## 2020-01-20 ENCOUNTER — Ambulatory Visit (INDEPENDENT_AMBULATORY_CARE_PROVIDER_SITE_OTHER): Payer: PPO | Admitting: Cardiovascular Disease

## 2020-01-20 ENCOUNTER — Encounter: Payer: Self-pay | Admitting: Cardiovascular Disease

## 2020-01-20 VITALS — BP 150/62 | HR 103 | Ht 64.0 in | Wt 207.5 lb

## 2020-01-20 DIAGNOSIS — I10 Essential (primary) hypertension: Secondary | ICD-10-CM

## 2020-01-20 DIAGNOSIS — E785 Hyperlipidemia, unspecified: Secondary | ICD-10-CM | POA: Diagnosis not present

## 2020-01-20 DIAGNOSIS — I5033 Acute on chronic diastolic (congestive) heart failure: Secondary | ICD-10-CM | POA: Diagnosis not present

## 2020-01-20 DIAGNOSIS — R001 Bradycardia, unspecified: Secondary | ICD-10-CM | POA: Diagnosis not present

## 2020-01-20 DIAGNOSIS — E782 Mixed hyperlipidemia: Secondary | ICD-10-CM

## 2020-01-20 DIAGNOSIS — I25118 Atherosclerotic heart disease of native coronary artery with other forms of angina pectoris: Secondary | ICD-10-CM | POA: Diagnosis not present

## 2020-01-20 MED ORDER — LOSARTAN POTASSIUM 100 MG PO TABS: 100.0000 mg | ORAL_TABLET | Freq: Every day | ORAL | 3 refills | Status: DC

## 2020-01-20 MED ORDER — TORSEMIDE 20 MG PO TABS: 20.0000 mg | ORAL_TABLET | ORAL | 3 refills | Status: DC | PRN

## 2020-01-20 MED ORDER — ATORVASTATIN CALCIUM 80 MG PO TABS: 80.0000 mg | ORAL_TABLET | Freq: Every evening | ORAL | 3 refills | Status: DC

## 2020-01-20 MED ORDER — CLOPIDOGREL BISULFATE 75 MG PO TABS: 75.0000 mg | ORAL_TABLET | Freq: Every day | ORAL | 3 refills | Status: DC

## 2020-01-20 MED ORDER — POTASSIUM CHLORIDE ER 10 MEQ PO TBCR: 10.0000 meq | EXTENDED_RELEASE_TABLET | Freq: Every day | ORAL | 3 refills | Status: DC

## 2020-01-20 NOTE — Patient Instructions (Signed)

## 2020-01-22 DIAGNOSIS — R809 Proteinuria, unspecified: Secondary | ICD-10-CM | POA: Diagnosis not present

## 2020-01-22 DIAGNOSIS — E1142 Type 2 diabetes mellitus with diabetic polyneuropathy: Secondary | ICD-10-CM | POA: Diagnosis not present

## 2020-01-22 DIAGNOSIS — E1129 Type 2 diabetes mellitus with other diabetic kidney complication: Secondary | ICD-10-CM | POA: Diagnosis not present

## 2020-01-22 DIAGNOSIS — Z794 Long term (current) use of insulin: Secondary | ICD-10-CM | POA: Diagnosis not present

## 2020-01-22 DIAGNOSIS — E1159 Type 2 diabetes mellitus with other circulatory complications: Secondary | ICD-10-CM | POA: Diagnosis not present

## 2020-01-22 DIAGNOSIS — E1165 Type 2 diabetes mellitus with hyperglycemia: Secondary | ICD-10-CM | POA: Diagnosis not present

## 2020-01-30 NOTE — Progress Notes (Deleted)
Subjective:   Joseph [name] is a 86 y.o. male who presents for Medicare Annual/Subsequent preventive examination.    This visit is being conducted through telemedicine due to the COVID-19 pandemic. This patient has given me verbal consent via doximity to conduct this visit, patient states they are participating from their home address. Some vital signs may be absent or patient reported.    Patient identification: identified by name, DOB, and current address  Review of Systems:  N/A        Objective:    Vitals: There were no vitals taken for this visit.  There is no height or weight on file to calculate BMI. Unable to obtain vitals due to visit being conducted via telephonically.   Advanced Directives 12/16/2019 12/16/2019 01/29/2019 01/08/2018 08/08/2017 02/03/2017 12/23/2016  Does Patient Have a Medical Advance Directive? Yes Yes Yes No Yes No Yes  Type of Advance Directive Living will Living will [place];Living will - Living will - Living will  Does patient want to make changes to medical advance directive? No - Patient declined No - Patient declined - No - Patient declined - - -  Copy of [place] in Chart? - - No - copy requested - - - No - copy requested  Would patient like information on creating a medical advance directive? - - - - - No - Patient declined -    Tobacco Social History   Tobacco Use  Smoking Status Former Smoker  . Packs/day: 0.00  . Years: 6.00  . Pack years: 0.00  . Types: Pipe, Cigars  . Quit date: 12/23/1980  . Years since quitting: 39.1  Smokeless Tobacco Never Used     Counseling given: Not Answered   Clinical Intake:                  Diabetes:  Is the patient diabetic?  Yes  If diabetic, was a CBG obtained today?  No  Did the patient bring in their glucometer from home?  No  How often do you monitor your CBG's? ***.   Financial Strains and Diabetes Management:  Are you having any  financial strains with the device, your supplies or your medication? No .  Does the patient want to be seen by Chronic Care Management for management of their diabetes?  No  Would the patient like to be referred to a Nutritionist or for Diabetic Management?  No   Diabetic Exams:  Diabetic Eye Exam: Completed 09/21/16. Overdue for diabetic eye exam. Pt has been advised about the importance in completing this exam. ***  Diabetic Foot Exam: Completed 12/26/18. Pt has been advised about the importance in completing this exam.  ***         Past Medical History:  Diagnosis Date  . CAD (coronary artery disease)    a. s/p CABG;  b. 07/2012 Cath: 3VD including 80 dLCX (med Rx), 4/4 patent grafts.  . Chronic diastolic CHF (congestive heart failure) ([place])    a. 07/2012 Echo: EF 55-60%, no rwma, Gr 1 DD, mild MR;  04/2014 Echo: EF 55-60%, no rwma, mild MR, mildly dil LA.  . Diabetes mellitus, type 2 ([place])   . HTN (hypertension)   . Hyperlipidemia   . Hypothyroidism   . Lower extremity edema    a. 03/2014 amlodipine d/c'd.  Marland Kitchen Spinal stenosis    Past Surgical History:  Procedure Laterality Date  . APPENDECTOMY    . CARDIAC CATHETERIZATION  07/2012  [facility]/no stents  . CHOLECYSTECTOMY    . CORNEAL TRANSPLANT    . CORONARY ARTERY BYPASS GRAFT  1994  . HEMORRHOID SURGERY    . LEFT HEART CATH AND CORS/GRAFTS ANGIOGRAPHY N/A 12/17/2019   Procedure: LEFT HEART CATH AND CORS/GRAFTS ANGIOGRAPHY;  Surgeon: Nelva Bush, MD;  Location: [facility];  Service: Cardiovascular;  Laterality: N/A;  . PROSTATE BIOPSY    . TOE SURGERY     ingrown toe nail   Family History  Problem Relation Age of Onset  . Heart failure Mother   . Heart attack Father   . Stroke Brother   . Heart failure Maternal Uncle   . Heart failure Maternal Grandfather   . Sleep apnea Daughter   . Sleep apnea Son   . Kidney Stones Son    Social History   Socioeconomic History  . Marital status: Married    Spouse name:  Not on file  . Number of children: 3  . Years of education: Not on file  . Highest education level: Some college, no degree  Occupational History  . Occupation: retired  Tobacco Use  . Smoking status: Former Smoker    Packs/day: 0.00    Years: 6.00    Pack years: 0.00    Types: Pipe, Cigars    Quit date: 12/23/1980    Years since quitting: 39.1  . Smokeless tobacco: Never Used  Substance and Sexual Activity  . Alcohol use: No  . Drug use: No  . Sexual activity: Never  Other Topics Concern  . Not on file  Social History Narrative   Retired.    Social Determinants of Health   Financial Resource Strain:   . Difficulty of Paying Living Expenses:   Food Insecurity:   . Worried About Charity fundraiser in the Last Year:   . Arboriculturist in the Last Year:   Transportation Needs:   . Film/video editor (Medical):   Marland Kitchen Lack of Transportation (Non-Medical):   Physical Activity: Inactive  . Days of Exercise per Week: 0 days  . Minutes of Exercise per Session: 0 min  Stress: No Stress Concern Present  . Feeling of Stress : Not at all  Social Connections: Unknown  . Frequency of Communication with Friends and Family: Patient refused  . Frequency of Social Gatherings with Friends and Family: Patient refused  . Attends Religious Services: Patient refused  . Active Member of Clubs or Organizations: Patient refused  . Attends Archivist Meetings: Patient refused  . Marital Status: Patient refused    Outpatient Encounter Medications as of 02/03/2020  Medication Sig  . aspirin EC 81 MG tablet Take 1 tablet (81 mg total) by mouth daily.  Marland Kitchen atorvastatin (LIPITOR) 80 MG tablet Take 1 tablet (80 mg total) by mouth every evening.  . Cholecalciferol 125 MCG (5000 UT) capsule Take 5,000 Units by mouth at bedtime.  . clobetasol cream (TEMOVATE) AB-123456789 % Apply 1 application topically 2 (two) times daily. As needed to hands and elbows  . clopidogrel (PLAVIX) 75 MG tablet Take 1  tablet (75 mg total) by mouth daily.  . Continuous Blood Gluc Sensor (FREESTYLE LIBRE 14 DAY SENSOR) MISC USE AS DIRECTED EVERY 14 DAYS  . hydrocortisone 2.5 % cream Apply 1 application topically 2 (two) times daily. As needed to face, crease, groin and butt  . insulin aspart (NOVOLOG) 100 UNIT/ML injection Inject 0-9 Units into the skin 3 (three) times daily with meals. (Patient taking  differently: Inject into the skin 3 (three) times daily with meals. 10 am, 15 lunch, 20 pm)  . insulin glargine (LANTUS) 100 UNIT/ML injection Inject 0.68 mLs (68 Units total) into the skin at bedtime. (Patient taking differently: Inject 65 Units into the skin at bedtime. )  . ketoconazole (NIZORAL) 2 % cream Apply 1 application topically 2 (two) times daily as needed for irritation. To groin, fave and buttocks.  Marland Kitchen levothyroxine (SYNTHROID, LEVOTHROID) 50 MCG tablet TAKE 1 TABLET BY MOUTH ONCE DAILY BEFORE BREAKFAST (Patient taking differently: Take 25 mcg by mouth daily before breakfast. )  . losartan (COZAAR) 100 MG tablet Take 1 tablet (100 mg total) by mouth at bedtime.  . Magnesium 250 MG TABS Take 250 mg by mouth as needed (swelling). Take when you take your Torsemide.  . mometasone (ELOCON) 0.1 % ointment Apply topically daily. As needed to scalp and ears  . nitroGLYCERIN (NITROSTAT) 0.4 MG SL tablet Place 1 tablet (0.4 mg total) under the tongue every 5 (five) minutes as needed for chest pain.  Marland Kitchen omeprazole (PRILOSEC) 20 MG capsule Take 1 capsule by mouth once daily (Patient taking differently: Take 20 mg by mouth at bedtime. )  . potassium chloride (KLOR-CON) 10 MEQ tablet Take 1 tablet (10 mEq total) by mouth daily.  Marland Kitchen torsemide (DEMADEX) 20 MG tablet Take 1 tablet (20 mg total) by mouth as needed (As needed for swelling and shortness of breath). Also take extra potassium 10 meq with this.   No facility-administered encounter medications on file as of 02/03/2020.    Activities of Daily Living In your  present state of health, do you have any difficulty performing the following activities: 12/16/2019  Hearing? N  Vision? N  Difficulty concentrating or making decisions? N  Walking or climbing stairs? N  Dressing or bathing? N  Doing errands, shopping? N  Some recent data might be hidden    Patient Care Team: [name]., MD as PCP - General (Unknown Physician Specialty) Minna Merritts, MD as PCP - Cardiology (Cardiology) Minna Merritts, MD as Consulting Physician (Cardiology) Samara Deist, DPM as Referring Physician (Podiatry) Gabriel Carina, Betsey Holiday, MD as Physician Assistant (Endocrinology) Marsh Dolly, MD as Referring Physician (Internal Medicine)   Assessment:   This is a routine wellness examination for Mahdy.  Exercise Activities and Dietary recommendations    Goals    . Exercise 3x per week (30 min per time)     Recommend increasing exercise to 3 days a week for at least 30 minutes.     . Increase water intake     Starting 12/23/16, I will starting drinking 4 glasses of water a day.       Fall Risk Fall Risk  01/29/2019 01/08/2018 08/08/2017 12/23/2016 09/28/2016  Falls in the past year? 0 No No No No   FALL RISK PREVENTION PERTAINING TO THE HOME:  Any stairs in or around the home? {YES/NO:21197} If so, are there any without handrails? {YES/NO:21197}  Home free of loose throw rugs in walkways, pet beds, electrical cords, etc? Yes  Adequate lighting in your home to reduce risk of falls? Yes   ASSISTIVE DEVICES UTILIZED TO PREVENT FALLS:  Life alert? {YES/NO:21197} Use of a cane, walker or w/c? {YES/NO:21197} Grab bars in the bathroom? {YES/NO:21197} Shower chair or bench in shower? {YES/NO:21197} Elevated toilet seat or a handicapped toilet? {YES/NO:21197}   TIMED UP AND GO:  Was the test performed? No .    Depression Screen PHQ  2/9 Scores 01/29/2019 01/29/2019 01/08/2018 08/08/2017  PHQ - 2 Score 0 0 0 0  PHQ- 9 Score 0 - - 0    Cognitive  Function     6CIT Screen 01/29/2019 01/08/2018 12/23/2016  What Year? 0 points 0 points 0 points  What month? 0 points 0 points 0 points  What time? 0 points 0 points 0 points  Count back from 20 0 points 0 points 0 points  Months in reverse 0 points 2 points 0 points  Repeat phrase 0 points 0 points 2 points  Total Score 0 2 2    Immunization History  Administered Date(s) Administered  . Fluad Quad(high Dose 65+) 11/07/2019  . Influenza Split 08/23/2011  . Influenza, High Dose Seasonal PF 09/01/2015, 08/08/2017  . Influenza,inj,Quad PF,6+ Mos 08/05/2013, 08/04/2014, 08/18/2016  . Pneumococcal Conjugate-13 03/24/2015  . Pneumococcal Polysaccharide-23 10/18/2014  . Tdap 08/04/2014  . Zoster 08/04/2014    Qualifies for Shingles Vaccine? Yes  Zostavax completed 08/04/14. Due for Shingrix. Pt has been advised to call insurance company to determine out of pocket expense. Advised may also receive vaccine at local pharmacy or Health Dept. Verbalized acceptance and understanding.  Tdap: Up to date  Flu Vaccine: Up to date  Pneumococcal Vaccine: Completed series  Screening Tests Health Maintenance  Topic Date Due  . OPHTHALMOLOGY EXAM  09/21/2017  . FOOT EXAM  12/27/2019  . HEMOGLOBIN A1C  06/14/2020  . TETANUS/TDAP  08/04/2024  . INFLUENZA VACCINE  Completed  . PNA vac Low Risk Adult  Completed   Cancer Screenings:  Colorectal Screening: No longer required.   Lung Cancer Screening: (Low Dose CT Chest recommended if Age 74-80 years, 30 pack-year currently smoking OR have quit w/in 15years.) does not qualify.   Additional Screening:  Dental Screening: Recommended annual dental exams for proper oral hygiene  Community Resource Referral:  CRR required this visit?  No        Plan:  I have personally reviewed and addressed the Medicare Annual Wellness questionnaire and have noted the following in the patient's chart:  A. Medical and social history B. Use of alcohol, tobacco  or illicit drugs  C. Current medications and supplements D. Functional ability and status E.  Nutritional status F.  Physical activity G. Advance directives H. List of other physicians I.  Hospitalizations, surgeries, and ER visits in previous 12 months J.  [place] such as hearing and vision if needed, cognitive and depression L. Referrals and appointments   In addition, I have reviewed and discussed with patient certain preventive protocols, quality metrics, and best practice recommendations. A written personalized care plan for preventive services as well as general preventive health recommendations were provided to patient.   Glendora Score, [place]  075-GRM Nurse Health Advisor  Nurse Notes: ***

## 2020-02-03 ENCOUNTER — Telehealth: Payer: Self-pay

## 2020-02-03 ENCOUNTER — Ambulatory Visit: Payer: PPO

## 2020-02-03 ENCOUNTER — Other Ambulatory Visit: Payer: Self-pay

## 2020-02-03 NOTE — Telephone Encounter (Signed)
Scheduled telephonic AWV for 02/13/20 @ 10:00 AM.

## 2020-02-03 NOTE — Telephone Encounter (Signed)
Copied from [place] [phone]. Topic: Medicare AWV >> Feb 03, 2020  9:08 AM Virl Axe D wrote: Reason for CRM: Pt called to reschedule AWV with [facility] for today. Unable pull up schedule. Please reach out to pt to reschedule.

## 2020-02-06 ENCOUNTER — Ambulatory Visit: Payer: Self-pay | Admitting: Family Medicine

## 2020-02-10 ENCOUNTER — Encounter: Payer: Self-pay | Admitting: Family Medicine

## 2020-02-10 ENCOUNTER — Ambulatory Visit (INDEPENDENT_AMBULATORY_CARE_PROVIDER_SITE_OTHER): Payer: PPO | Admitting: Family Medicine

## 2020-02-10 ENCOUNTER — Other Ambulatory Visit: Payer: Self-pay

## 2020-02-10 VITALS — BP 158/68 | HR 84 | Temp 97.3°F | Wt 219.0 lb

## 2020-02-10 DIAGNOSIS — E782 Mixed hyperlipidemia: Secondary | ICD-10-CM | POA: Diagnosis not present

## 2020-02-10 DIAGNOSIS — Z794 Long term (current) use of insulin: Secondary | ICD-10-CM

## 2020-02-10 DIAGNOSIS — R109 Unspecified abdominal pain: Secondary | ICD-10-CM | POA: Diagnosis not present

## 2020-02-10 DIAGNOSIS — I25708 Atherosclerosis of coronary artery bypass graft(s), unspecified, with other forms of angina pectoris: Secondary | ICD-10-CM

## 2020-02-10 DIAGNOSIS — E1151 Type 2 diabetes mellitus with diabetic peripheral angiopathy without gangrene: Secondary | ICD-10-CM | POA: Diagnosis not present

## 2020-02-10 LAB — POCT URINALYSIS DIPSTICK
Bilirubin, UA: NEGATIVE
Glucose, UA: POSITIVE — AB
Ketones, UA: NEGATIVE
Leukocytes, UA: NEGATIVE
Nitrite, UA: NEGATIVE
Protein, UA: POSITIVE — AB
Spec Grav, UA: 1.025 (ref 1.010–1.025)
Urobilinogen, UA: 0.2 E.U./dL
pH, UA: 6 (ref 5.0–8.0)

## 2020-02-10 NOTE — Progress Notes (Signed)
Patient: Joseph [name] Male    DOB: June 16, 1925   86 y.o.   MRN: [id number] Visit Date: 02/10/2020  Today's Provider: Wilhemena Durie, MD   Chief Complaint  Patient presents with  . Urinary Frequency   Subjective:     HPI   Patient complaining that he might have a kidney stones, says that he is having some frequency and some pain.  Patient has mild discomfort in left groin radiating rating to left flank.  No hematuria no other symptoms other than above.  Since his last visit his strength is better and his breathing and chest pain have gotten back to baseline.  He has had follow-up with Dr. Mickie Kay. and Dr. Rockey Situ No Known Allergies   Current Outpatient Medications:  .  aspirin EC 81 MG tablet, Take 1 tablet (81 mg total) by mouth daily., Disp: 90 tablet, Rfl: 3 .  atorvastatin (LIPITOR) 80 MG tablet, Take 1 tablet (80 mg total) by mouth every evening., Disp: 90 tablet, Rfl: 3 .  Cholecalciferol 125 MCG (5000 UT) capsule, Take 5,000 Units by mouth at bedtime., Disp: , Rfl:  .  clobetasol cream (TEMOVATE) AB-123456789 %, Apply 1 application topically 2 (two) times daily. As needed to hands and elbows, Disp: , Rfl:  .  clopidogrel (PLAVIX) 75 MG tablet, Take 1 tablet (75 mg total) by mouth daily., Disp: 90 tablet, Rfl: 3 .  Continuous Blood Gluc Sensor (FREESTYLE LIBRE 14 DAY SENSOR) MISC, USE AS DIRECTED EVERY 14 DAYS, Disp: 2 each, Rfl: 0 .  hydrocortisone 2.5 % cream, Apply 1 application topically 2 (two) times daily. As needed to face, crease, groin and butt, Disp: 30 g, Rfl: 2 .  insulin aspart (NOVOLOG) 100 UNIT/ML injection, Inject 0-9 Units into the skin 3 (three) times daily with meals. (Patient taking differently: Inject into the skin 3 (three) times daily with meals. 10 am, 15 lunch, 20 pm), Disp: 10 mL, Rfl: 0 .  insulin glargine (LANTUS) 100 UNIT/ML injection, Inject 0.68 mLs (68 Units total) into the skin at bedtime. (Patient taking differently: Inject 65 Units into the  skin at bedtime. ), Disp: 10 mL, Rfl: 0 .  ketoconazole (NIZORAL) 2 % cream, Apply 1 application topically 2 (two) times daily as needed for irritation. To groin, fave and buttocks., Disp: 15 g, Rfl: 2 .  levothyroxine (SYNTHROID, LEVOTHROID) 50 MCG tablet, TAKE 1 TABLET BY MOUTH ONCE DAILY BEFORE BREAKFAST (Patient taking differently: Take 25 mcg by mouth daily before breakfast. ), Disp: 90 tablet, Rfl: 3 .  losartan (COZAAR) 100 MG tablet, Take 1 tablet (100 mg total) by mouth at bedtime., Disp: 90 tablet, Rfl: 3 .  Magnesium 250 MG TABS, Take 250 mg by mouth as needed (swelling). Take when you take your Torsemide., Disp: , Rfl:  .  mometasone (ELOCON) 0.1 % ointment, Apply topically daily. As needed to scalp and ears, Disp: , Rfl:  .  nitroGLYCERIN (NITROSTAT) 0.4 MG SL tablet, Place 1 tablet (0.4 mg total) under the tongue every 5 (five) minutes as needed for chest pain., Disp: 25 tablet, Rfl: 3 .  omeprazole (PRILOSEC) 20 MG capsule, Take 1 capsule by mouth once daily (Patient taking differently: Take 20 mg by mouth at bedtime. ), Disp: 90 capsule, Rfl: 3 .  potassium chloride (KLOR-CON) 10 MEQ tablet, Take 1 tablet (10 mEq total) by mouth daily., Disp: 90 tablet, Rfl: 3 .  torsemide (DEMADEX) 20 MG tablet, Take 1 tablet (20 mg total)  by mouth as needed (As needed for swelling and shortness of breath). Also take extra potassium 10 meq with this., Disp: 90 tablet, Rfl: 3  Review of Systems  Constitutional: Negative for appetite change, chills and fever.  HENT: Negative.   Eyes: Negative.   Respiratory: Negative for chest tightness, shortness of breath and wheezing.   Cardiovascular: Negative for chest pain and palpitations.  Gastrointestinal: Negative for abdominal pain, nausea and vomiting.  Endocrine: Negative.   Genitourinary: Positive for frequency and urgency.  Allergic/Immunologic: Negative.   Psychiatric/Behavioral: Negative.     Social History   Tobacco Use  . Smoking status:  Former Smoker    Packs/day: 0.00    Years: 6.00    Pack years: 0.00    Types: Pipe, Cigars    Quit date: 12/23/1980    Years since quitting: 39.1  . Smokeless tobacco: Never Used  Substance Use Topics  . Alcohol use: No      Objective:   BP (!) 180/65 (BP Location: Left Arm, Patient Position: Sitting, Cuff Size: Large)   Pulse 77   Temp (!) 97.3 F (36.3 C) (Temporal)   Wt 219 lb (99.3 kg)   BMI 37.59 kg/m  Vitals:   02/10/20 1448  BP: (!) 180/65  Pulse: 77  Temp: (!) 97.3 F (36.3 C)  TempSrc: Temporal  Weight: 219 lb (99.3 kg)  Body mass index is 37.59 kg/m.   Physical Exam Vitals reviewed.  Constitutional:      Appearance: He is well-developed. He is obese.  HENT:     Head: Normocephalic and atraumatic.     Right Ear: External ear normal.     Left Ear: External ear normal.     Nose: Nose normal.  Eyes:     General: No scleral icterus.    Conjunctiva/sclera: Conjunctivae normal.  Neck:     Thyroid: No thyromegaly.  Cardiovascular:     Rate and Rhythm: Normal rate and regular rhythm.     Heart sounds: Normal heart sounds.  Pulmonary:     Effort: Pulmonary effort is normal.     Breath sounds: Normal breath sounds.  Abdominal:     General: There is no distension.     Palpations: Abdomen is soft. There is no mass.     Tenderness: There is no abdominal tenderness.  Genitourinary:    Testes: Normal.     Comments: No hernia Musculoskeletal:     Comments: 2+ edema.  Skin:    General: Skin is warm and dry.     Comments: No rash in groin or flank  Neurological:     General: No focal deficit present.     Mental Status: He is alert and oriented to person, place, and time.  Psychiatric:        Mood and Affect: Mood normal.        Behavior: Behavior normal.        Thought Content: Thought content normal.        Judgment: Judgment normal.      No results found for any visits on 02/10/20.     Assessment & Plan    1. Flank pain Push fluids for the  time being.  This discomfort is minimal at this time.  I suspect a small stone. - POCT urinalysis dipstick  2. Atherosclerosis of coronary artery bypass graft of native heart with stable angina pectoris ([place]) All risk factors treated  3. Type 2 diabetes mellitus with diabetic peripheral angiopathy without gangrene, with long-term  current use of insulin ([place]) Followed by endocrinology  4. Morbid obesity due to excess calories ([place]) 5 to keep weight down.  5. Mixed hyperlipidemia On high-dose atorvastatin at 80 mg.  Recheck 4 months.     Demarion Pondexter Cranford Mon, MD  [facility]

## 2020-02-12 NOTE — Progress Notes (Signed)
Subjective:   KESTER GALKA is a 86 y.o. male who presents for Medicare Annual/Subsequent preventive examination.    This visit is being conducted through telemedicine due to the COVID-19 pandemic. This patient has given me verbal consent via doximity to conduct this visit, patient states they are participating from their home address. Some vital signs may be absent or patient reported.    Patient identification: identified by name, DOB, and current address  Review of Systems:  N/A  Cardiac Risk Factors include: advanced age (>36men, >39 women);diabetes mellitus;dyslipidemia;male gender;hypertension     Objective:    Vitals: There were no vitals taken for this visit.  There is no height or weight on file to calculate BMI. Unable to obtain vitals due to visit being conducted via telephonically.   Advanced Directives 02/13/2020 12/16/2019 12/16/2019 01/29/2019 01/08/2018 08/08/2017 02/03/2017  Does Patient Have a Medical Advance Directive? Yes Yes Yes Yes No Yes No  Type of Paramedic of [place];Living will Living will Living will [place];Living will - Living will -  Does patient want to make changes to medical advance directive? - No - Patient declined No - Patient declined - No - Patient declined - -  Copy of [place] in Chart? No - copy requested - - No - copy requested - - -  Would patient like information on creating a medical advance directive? - - - - - - No - Patient declined    Tobacco Social History   Tobacco Use  Smoking Status Former Smoker  . Packs/day: 0.00  . Years: 6.00  . Pack years: 0.00  . Types: Pipe, Cigars  . Quit date: 12/23/1980  . Years since quitting: 39.1  Smokeless Tobacco Never Used     Counseling given: Not Answered   Clinical Intake:  Pre-visit preparation completed: Yes  Pain : No/denies pain Pain Score: 0-No pain     Nutritional Risks: None Diabetes: Yes  How often do  you need to have someone help you when you read instructions, pamphlets, or other written materials from your doctor or pharmacy?: 1 - Never   Diabetes:  Is the patient diabetic?  Yes  If diabetic, was a CBG obtained today?  No  Did the patient bring in their glucometer from home?  No  How often do you monitor your CBG's? Four times a day.   Financial Strains and Diabetes Management:  Are you having any financial strains with the device, your supplies or your medication? No .  Does the patient want to be seen by Chronic Care Management for management of their diabetes?  No  Would the patient like to be referred to a Nutritionist or for Diabetic Management?  No   Diabetic Exams:  Diabetic Eye Exam: Completed 11/18/19. Repeat yearly.  Diabetic Foot Exam: Completed 01/22/20. Repeat yearly.  Interpreter Needed?: No  Information entered by :: [facility], LPN  Past Medical History:  Diagnosis Date  . CAD (coronary artery disease)    a. s/p CABG;  b. 07/2012 Cath: 3VD including 80 dLCX (med Rx), 4/4 patent grafts.  . Chronic diastolic CHF (congestive heart failure) ([place])    a. 07/2012 Echo: EF 55-60%, no rwma, Gr 1 DD, mild MR;  04/2014 Echo: EF 55-60%, no rwma, mild MR, mildly dil LA.  . Diabetes mellitus, type 2 ([place])   . HTN (hypertension)   . Hyperlipidemia   . Hypothyroidism   . Lower extremity edema    a.  03/2014 amlodipine d/c'd.  Marland Kitchen Spinal stenosis    Past Surgical History:  Procedure Laterality Date  . APPENDECTOMY    . CARDIAC CATHETERIZATION  07/2012   [facility]/no stents  . CHOLECYSTECTOMY    . CORNEAL TRANSPLANT    . CORONARY ARTERY BYPASS GRAFT  1994  . HEMORRHOID SURGERY    . LEFT HEART CATH AND CORS/GRAFTS ANGIOGRAPHY N/A 12/17/2019   Procedure: LEFT HEART CATH AND CORS/GRAFTS ANGIOGRAPHY;  Surgeon: Nelva Bush, MD;  Location: [facility];  Service: Cardiovascular;  Laterality: N/A;  . PROSTATE BIOPSY    . TOE SURGERY     ingrown toe nail   Family  History  Problem Relation Age of Onset  . Heart failure Mother   . Heart attack Father   . Stroke Brother   . Heart failure Maternal Uncle   . Heart failure Maternal Grandfather   . Sleep apnea Daughter   . Sleep apnea Son   . Kidney Stones Son    Social History   Socioeconomic History  . Marital status: Married    Spouse name: Not on file  . Number of children: 3  . Years of education: Not on file  . Highest education level: Some college, no degree  Occupational History  . Occupation: retired  Tobacco Use  . Smoking status: Former Smoker    Packs/day: 0.00    Years: 6.00    Pack years: 0.00    Types: Pipe, Cigars    Quit date: 12/23/1980    Years since quitting: 39.1  . Smokeless tobacco: Never Used  Substance and Sexual Activity  . Alcohol use: No  . Drug use: No  . Sexual activity: Never  Other Topics Concern  . Not on file  Social History Narrative   Retired.    Social Determinants of Health   Financial Resource Strain: Low Risk   . Difficulty of Paying Living Expenses: Not hard at all  Food Insecurity: No Food Insecurity  . Worried About Charity fundraiser in the Last Year: Never true  . Ran Out of Food in the Last Year: Never true  Transportation Needs: No Transportation Needs  . Lack of Transportation (Medical): No  . Lack of Transportation (Non-Medical): No  Physical Activity: Inactive  . Days of Exercise per Week: 0 days  . Minutes of Exercise per Session: 0 min  Stress: No Stress Concern Present  . Feeling of Stress : Not at all  Social Connections: Slightly Isolated  . Frequency of Communication with Friends and Family: More than three times a week  . Frequency of Social Gatherings with Friends and Family: Never  . Attends Religious Services: More than 4 times per year  . Active Member of Clubs or Organizations: No  . Attends Archivist Meetings: Never  . Marital Status: Married    Outpatient Encounter Medications as of 02/13/2020    Medication Sig  . aspirin EC 81 MG tablet Take 1 tablet (81 mg total) by mouth daily.  Marland Kitchen atorvastatin (LIPITOR) 80 MG tablet Take 1 tablet (80 mg total) by mouth every evening.  . Cholecalciferol 125 MCG (5000 UT) capsule Take 5,000 Units by mouth at bedtime.  . clobetasol cream (TEMOVATE) [id number] % Apply 1 application topically 2 (two) times daily. As needed to hands and elbows  . clopidogrel (PLAVIX) 75 MG tablet Take 1 tablet (75 mg total) by mouth daily.  . Continuous Blood Gluc Sensor (FREESTYLE LIBRE 14 DAY SENSOR) MISC USE AS DIRECTED  EVERY 14 DAYS  . hydrocortisone 2.5 % cream Apply 1 application topically 2 (two) times daily. As needed to face, crease, groin and butt  . insulin aspart (NOVOLOG) 100 UNIT/ML injection Inject 0-9 Units into the skin 3 (three) times daily with meals. (Patient taking differently: Inject into the skin 3 (three) times daily with meals. 10 am, 15 lunch, 20 pm)  . insulin glargine (LANTUS) 100 UNIT/ML injection Inject 0.68 mLs (68 Units total) into the skin at bedtime. (Patient taking differently: Inject 65 Units into the skin at bedtime. )  . ketoconazole (NIZORAL) 2 % cream Apply 1 application topically 2 (two) times daily as needed for irritation. To groin, fave and buttocks.  Marland Kitchen levothyroxine (SYNTHROID, LEVOTHROID) 50 MCG tablet TAKE 1 TABLET BY MOUTH ONCE DAILY BEFORE BREAKFAST (Patient taking differently: Take 25 mcg by mouth daily before breakfast. )  . losartan (COZAAR) 100 MG tablet Take 1 tablet (100 mg total) by mouth at bedtime.  . Magnesium 250 MG TABS Take 250 mg by mouth as needed (swelling). Take when you take your Torsemide.  . mometasone (ELOCON) 0.1 % ointment Apply topically daily. As needed to scalp and ears  . nitroGLYCERIN (NITROSTAT) 0.4 MG SL tablet Place 1 tablet (0.4 mg total) under the tongue every 5 (five) minutes as needed for chest pain.  Marland Kitchen omeprazole (PRILOSEC) 20 MG capsule Take 1 capsule by mouth once daily (Patient taking  differently: Take 20 mg by mouth at bedtime. )  . potassium chloride (KLOR-CON) 10 MEQ tablet Take 1 tablet (10 mEq total) by mouth daily.  Marland Kitchen torsemide (DEMADEX) 20 MG tablet Take 1 tablet (20 mg total) by mouth as needed (As needed for swelling and shortness of breath). Also take extra potassium 10 meq with this.   No facility-administered encounter medications on file as of 02/13/2020.    Activities of Daily Living In your present state of health, do you have any difficulty performing the following activities: 02/13/2020 12/16/2019  Hearing? N N  Vision? N N  Difficulty concentrating or making decisions? N N  Walking or climbing stairs? N N  Dressing or bathing? N N  Doing errands, shopping? N N  Preparing Food and eating ? N -  Using the Toilet? N -  In the past six months, have you accidently leaked urine? N -  Do you have problems with loss of bowel control? N -  Managing your Medications? N -  Managing your Finances? N -  Housekeeping or managing your Housekeeping? N -  Some recent data might be hidden    Patient Care Team: [name]., MD as PCP - General (Unknown Physician Specialty) Minna Merritts, MD as PCP - Cardiology (Cardiology) Gabriel Carina Betsey Holiday, MD as Physician Assistant (Endocrinology) Marsh Dolly, MD as Referring Physician (Internal Medicine)   Assessment:   This is a routine wellness examination for [place].  Exercise Activities and Dietary recommendations Current Exercise Habits: The patient does not participate in regular exercise at present, Exercise limited by: None identified  Goals    . Exercise 3x per week (30 min per time)     Recommend increasing exercise to 3 days a week for at least 30 minutes.     . Increase water intake     Starting 12/23/16, I will starting drinking 4 glasses of water a day.       Fall Risk Fall Risk  02/13/2020 01/29/2019 01/08/2018 08/08/2017 12/23/2016  Falls in the past year? 0 0 No No  No  Number falls in past yr: 0  - - - -  Injury with Fall? 0 - - - -   FALL RISK PREVENTION PERTAINING TO THE HOME:  Any stairs in or around the home? Yes  If so, are there any without handrails? No   Home free of loose throw rugs in walkways, pet beds, electrical cords, etc? Yes  Adequate lighting in your home to reduce risk of falls? Yes   ASSISTIVE DEVICES UTILIZED TO PREVENT FALLS:  Life alert? No  Use of a cane, walker or w/c? No  Grab bars in the bathroom? Yes  Shower chair or bench in shower? Yes  Elevated toilet seat or a handicapped toilet? No    TIMED UP AND GO:  Was the test performed? No .    Depression Screen PHQ 2/9 Scores 02/13/2020 01/29/2019 01/29/2019 01/08/2018  PHQ - 2 Score 0 0 0 0  PHQ- 9 Score - 0 - -    Cognitive Function: Declined today.      6CIT Screen 01/29/2019 01/08/2018 12/23/2016  What Year? 0 points 0 points 0 points  What month? 0 points 0 points 0 points  What time? 0 points 0 points 0 points  Count back from 20 0 points 0 points 0 points  Months in reverse 0 points 2 points 0 points  Repeat phrase 0 points 0 points 2 points  Total Score 0 2 2    Immunization History  Administered Date(s) Administered  . Fluad Quad(high Dose 65+) 11/07/2019  . Influenza Split 08/23/2011  . Influenza, High Dose Seasonal PF 09/01/2015, 08/08/2017  . Influenza,inj,Quad PF,6+ Mos 08/05/2013, 08/04/2014, 08/18/2016  . Pneumococcal Conjugate-13 03/24/2015  . Pneumococcal Polysaccharide-23 10/18/2014  . Tdap 08/04/2014  . Zoster 08/04/2014    Qualifies for Shingles Vaccine? Yes  Zostavax completed 08/04/14. Due for Shingrix. Pt has been advised to call insurance company to determine out of pocket expense. Advised may also receive vaccine at local pharmacy or Health Dept. Verbalized acceptance and understanding.  Tdap: Up to date  Flu Vaccine: Up to date  Pneumococcal Vaccine: Completed series  Screening Tests Health Maintenance  Topic Date Due  . HEMOGLOBIN A1C  06/14/2020  .  OPHTHALMOLOGY EXAM  11/17/2020  . FOOT EXAM  01/21/2021  . TETANUS/TDAP  08/04/2024  . INFLUENZA VACCINE  Completed  . PNA vac Low Risk Adult  Completed   Cancer Screenings:  Colorectal Screening: No longer required.   Lung Cancer Screening: (Low Dose CT Chest recommended if Age 39-80 years, 30 pack-year currently smoking OR have quit w/in 15years.) does not qualify.   Additional Screening:  Dental Screening: Recommended annual dental exams for proper oral hygiene  Community Resource Referral:  CRR required this visit?  No        Plan:  I have personally reviewed and addressed the Medicare Annual Wellness questionnaire and have noted the following in the patient's chart:  A. Medical and social history B. Use of alcohol, tobacco or illicit drugs  C. Current medications and supplements D. Functional ability and status E.  Nutritional status F.  Physical activity G. Advance directives H. List of other physicians I.  Hospitalizations, surgeries, and ER visits in previous 12 months J.  [place] such as hearing and vision if needed, cognitive and depression L. Referrals and appointments   In addition, I have reviewed and discussed with patient certain preventive protocols, quality metrics, and best practice recommendations. A written personalized care plan for preventive services as  well as general preventive health recommendations were provided to patient.   Glendora Score, [place]  [id number] Nurse Health Advisor  Nurse Notes: None.

## 2020-02-13 ENCOUNTER — Ambulatory Visit (INDEPENDENT_AMBULATORY_CARE_PROVIDER_SITE_OTHER): Payer: PPO

## 2020-02-13 ENCOUNTER — Other Ambulatory Visit: Payer: Self-pay

## 2020-02-13 DIAGNOSIS — Z0000 Encounter for general adult medical examination without abnormal findings: Secondary | ICD-10-CM

## 2020-02-13 NOTE — Patient Instructions (Signed)
Joseph [name] , Thank you for taking time to come for your Medicare Wellness Visit. I appreciate your ongoing commitment to your health goals. Please review the following plan we discussed and let me know if I can assist you in the future.   Screening recommendations/referrals: Colonoscopy: No longer required.  Recommended yearly ophthalmology/optometry visit for glaucoma screening and checkup Recommended yearly dental visit for hygiene and checkup  Vaccinations: Influenza vaccine: Up to date Pneumococcal vaccine: Completed series Tdap vaccine: Up to date Shingles vaccine: Pt declines today.     Advanced directives: Please bring a copy of your POA (Power of Attorney) and/or Living Will to your next appointment.   Conditions/risks identified: Recommend to increase water intake to start exercising 3 times a week for at least 30 minutes at a time.   Next appointment: 06/11/20 @ 2:20 PM with Dr Rosanna Randy. Declined scheduling an AWV for 2022 at this time.   Preventive Care 28 Years and Older, Male Preventive care refers to lifestyle choices and visits with your health care provider that can promote health and wellness. What does preventive care include?  A yearly physical exam. This is also called an annual well check.  Dental exams once or twice a year.  Routine eye exams. Ask your health care provider how often you should have your eyes checked.  Personal lifestyle choices, including:  Daily care of your teeth and gums.  Regular physical activity.  Eating a healthy diet.  Avoiding tobacco and drug use.  Limiting alcohol use.  Practicing safe sex.  Taking low doses of aspirin every day.  Taking vitamin and mineral supplements as recommended by your health care provider. What happens during an annual well check? The services and screenings done by your health care provider during your annual well check will depend on your age, overall health, lifestyle risk factors, and family  history of disease. Counseling  Your health care provider may ask you questions about your:  Alcohol use.  Tobacco use.  Drug use.  Emotional well-being.  Home and relationship well-being.  Sexual activity.  Eating habits.  History of falls.  Memory and ability to understand (cognition).  Work and work Statistician. Screening  You may have the following tests or measurements:  Height, weight, and BMI.  Blood pressure.  Lipid and cholesterol levels. These may be checked every 5 years, or more frequently if you are over 23 years old.  Skin check.  Lung cancer screening. You may have this screening every year starting at age 70 if you have a 30-pack-year history of smoking and currently smoke or have quit within the past 15 years.  Fecal occult blood test (FOBT) of the stool. You may have this test every year starting at age 93.  Flexible sigmoidoscopy or colonoscopy. You may have a sigmoidoscopy every 5 years or a colonoscopy every 10 years starting at age 12.  Prostate cancer screening. Recommendations will vary depending on your family history and other risks.  Hepatitis C blood test.  Hepatitis B blood test.  Sexually transmitted disease (STD) testing.  Diabetes screening. This is done by checking your blood sugar (glucose) after you have not eaten for a while (fasting). You may have this done every 1-3 years.  Abdominal aortic aneurysm (AAA) screening. You may need this if you are a current or former smoker.  Osteoporosis. You may be screened starting at age 64 if you are at high risk. Talk with your health care provider about your test results, treatment options,  and if necessary, the need for more tests. Vaccines  Your health care provider may recommend certain vaccines, such as:  Influenza vaccine. This is recommended every year.  Tetanus, diphtheria, and acellular pertussis (Tdap, Td) vaccine. You may need a Td booster every 10 years.  Zoster vaccine.  You may need this after age 8.  Pneumococcal 13-valent conjugate (PCV13) vaccine. One dose is recommended after age 72.  Pneumococcal polysaccharide (PPSV23) vaccine. One dose is recommended after age 4. Talk to your health care provider about which screenings and vaccines you need and how often you need them. This information is not intended to replace advice given to you by your health care provider. Make sure you discuss any questions you have with your health care provider. Document Released: 12/04/2015 Document Revised: 07/27/2016 Document Reviewed: 09/08/2015 Elsevier Interactive Patient Education  2017 [place] Prevention in the Home Falls can cause injuries. They can happen to people of all ages. There are many things you can do to make your home safe and to help prevent falls. What can I do on the outside of my home?  Regularly fix the edges of walkways and driveways and fix any cracks.  Remove anything that might make you trip as you walk through a door, such as a raised step or threshold.  Trim any bushes or trees on the path to your home.  Use bright outdoor lighting.  Clear any walking paths of anything that might make someone trip, such as rocks or tools.  Regularly check to see if handrails are loose or broken. Make sure that both sides of any steps have handrails.  Any raised decks and porches should have guardrails on the edges.  Have any leaves, snow, or ice cleared regularly.  Use sand or salt on walking paths during winter.  Clean up any spills in your garage right away. This includes oil or grease spills. What can I do in the bathroom?  Use night lights.  Install grab bars by the toilet and in the tub and shower. Do not use towel bars as grab bars.  Use non-skid mats or decals in the tub or shower.  If you need to sit down in the shower, use a plastic, non-slip stool.  Keep the floor dry. Clean up any water that spills on the floor as soon  as it happens.  Remove soap buildup in the tub or shower regularly.  Attach bath mats securely with double-sided non-slip rug tape.  Do not have throw rugs and other things on the floor that can make you trip. What can I do in the bedroom?  Use night lights.  Make sure that you have a light by your bed that is easy to reach.  Do not use any sheets or blankets that are too big for your bed. They should not hang down onto the floor.  Have a firm chair that has side arms. You can use this for support while you get dressed.  Do not have throw rugs and other things on the floor that can make you trip. What can I do in the kitchen?  Clean up any spills right away.  Avoid walking on wet floors.  Keep items that you use a lot in easy-to-reach places.  If you need to reach something above you, use a strong step stool that has a grab bar.  Keep electrical cords out of the way.  Do not use floor polish or wax that makes floors slippery. If  you must use wax, use non-skid floor wax.  Do not have throw rugs and other things on the floor that can make you trip. What can I do with my stairs?  Do not leave any items on the stairs.  Make sure that there are handrails on both sides of the stairs and use them. Fix handrails that are broken or loose. Make sure that handrails are as long as the stairways.  Check any carpeting to make sure that it is firmly attached to the stairs. Fix any carpet that is loose or worn.  Avoid having throw rugs at the top or bottom of the stairs. If you do have throw rugs, attach them to the floor with carpet tape.  Make sure that you have a light switch at the top of the stairs and the bottom of the stairs. If you do not have them, ask someone to add them for you. What else can I do to help prevent falls?  Wear shoes that:  Do not have high heels.  Have rubber bottoms.  Are comfortable and fit you well.  Are closed at the toe. Do not wear sandals.  If  you use a stepladder:  Make sure that it is fully opened. Do not climb a closed stepladder.  Make sure that both sides of the stepladder are locked into place.  Ask someone to hold it for you, if possible.  Clearly mark and make sure that you can see:  Any grab bars or handrails.  First and last steps.  Where the edge of each step is.  Use tools that help you move around (mobility aids) if they are needed. These include:  Canes.  Walkers.  Scooters.  Crutches.  Turn on the lights when you go into a dark area. Replace any light bulbs as soon as they burn out.  Set up your furniture so you have a clear path. Avoid moving your furniture around.  If any of your floors are uneven, fix them.  If there are any pets around you, be aware of where they are.  Review your medicines with your doctor. Some medicines can make you feel dizzy. This can increase your chance of falling. Ask your doctor what other things that you can do to help prevent falls. This information is not intended to replace advice given to you by your health care provider. Make sure you discuss any questions you have with your health care provider. Document Released: 09/03/2009 Document Revised: 04/14/2016 Document Reviewed: 12/12/2014 Elsevier Interactive Patient Education  2017 [employer].

## 2020-02-21 ENCOUNTER — Telehealth: Payer: Self-pay

## 2020-02-21 NOTE — Telephone Encounter (Signed)
Patient advised.

## 2020-02-21 NOTE — Telephone Encounter (Signed)
Don't expect problems with getting the COVID vaccination. It is possible to get a day of body aches, headache or fever after one of the vaccinations (usually the second one). Increase in water intake and Tylenol usually clears this, if it happens. The biggest risk for him would be if he did not get the vaccination (because of his history of hypertension, heart disease and respiratory issues).

## 2020-02-21 NOTE — Telephone Encounter (Signed)
Copied from [place] [id number]. Topic: General - Inquiry >> Feb 20, 2020  2:12 PM [place], [place] D wrote: Reason for CRM: Pt is receiving his fist dose for the Covid vaccine on 02/24/20. Would like to know if there are any concerns he should be worried about. Please advise.

## 2020-02-24 ENCOUNTER — Ambulatory Visit: Payer: PPO | Attending: Internal Medicine

## 2020-02-24 ENCOUNTER — Other Ambulatory Visit: Payer: Self-pay

## 2020-02-24 DIAGNOSIS — Z23 Encounter for immunization: Secondary | ICD-10-CM

## 2020-02-24 NOTE — Progress Notes (Signed)
   Covid-[facility] Vaccination [facility]  Name:  Joseph [name]    MRN: [id number] DOB: 03-12-1925  02/24/2020  [name] was observed post Covid-19 immunization for 15 minutes without incident. He was provided with Vaccine Information Sheet and instruction to access the V-Safe system.   [name] was instructed to call 911 with any severe reactions post vaccine: Marland Kitchen Difficulty breathing  . Swelling of face and throat  . A fast heartbeat  . A bad rash all over body  . Dizziness and weakness   Immunizations Administered    Name Date Dose VIS Date Route   Pfizer COVID-19 Vaccine 02/24/2020 11:37 AM 0.3 mL 11/01/2019 Intramuscular   Manufacturer: [place]   Lot: [id number]   [place]: [id number]

## 2020-02-27 DIAGNOSIS — E1142 Type 2 diabetes mellitus with diabetic polyneuropathy: Secondary | ICD-10-CM | POA: Diagnosis not present

## 2020-03-18 ENCOUNTER — Ambulatory Visit: Payer: PPO | Attending: Internal Medicine

## 2020-03-18 DIAGNOSIS — Z23 Encounter for immunization: Secondary | ICD-10-CM

## 2020-03-18 NOTE — Progress Notes (Signed)
   Covid-19 Vaccination Clinic  Name:  Joseph [name]    MRN: [id number] DOB: [birth year]  03/18/2020  Mr. Joseph [name] was observed post Covid-19 immunization for 15 minutes without incident. He was provided with Vaccine Information Sheet and instruction to access the V-Safe system.   Mr. Joseph [name] was instructed to call 911 with any severe reactions post vaccine: Marland Kitchen Difficulty breathing  . Swelling of face and throat  . A fast heartbeat  . A bad rash all over body  . Dizziness and weakness   Immunizations Administered    Name Date Dose VIS Date Route   Pfizer COVID-19 Vaccine 03/18/2020  1:19 PM 0.3 mL 01/15/2019 Intramuscular   Manufacturer: Miranda   Lot: [id number]   [place]rer: [id number]

## 2020-04-07 ENCOUNTER — Telehealth: Payer: Self-pay | Admitting: Family Medicine

## 2020-04-07 NOTE — Chronic Care Management (AMB) (Signed)
  Chronic Care Management   Note  04/07/2020 Name: Joseph [name] MRN: [id number] DOB: June 29, 1925  Joseph [name] is a 86 y.o. year old male who is a primary care patient of [name]., MD. I reached out to [name] by phone today in response to a referral sent by Joseph [name] health plan.     Joseph [name] was given information about Chronic Care Management services today including:  1. CCM service includes personalized support from designated clinical staff supervised by his physician, including individualized plan of care and coordination with other care providers 2. 24/7 contact phone numbers for assistance for urgent and routine care needs. 3. Service will only be billed when office clinical staff spend 20 minutes or more in a month to coordinate care. 4. Only one practitioner may furnish and bill the service in a calendar month. 5. The patient may stop CCM services at any time (effective at the end of the month) by phone call to the office staff. 6. The patient will be responsible for cost sharing (co-pay) of up to 20% of the service fee (after annual deductible is met).  Patient agreed to services and verbal consent obtained.   Follow up plan: Telephone appointment with care management team member scheduled for:05/05/2020.  [place], [place] [id number] Direct Dial: [phone] [email or web address]: [email or web address]

## 2020-04-29 DIAGNOSIS — E1159 Type 2 diabetes mellitus with other circulatory complications: Secondary | ICD-10-CM | POA: Diagnosis not present

## 2020-04-29 LAB — HEMOGLOBIN A1C: Hemoglobin A1C: 9.8

## 2020-05-05 ENCOUNTER — Ambulatory Visit (INDEPENDENT_AMBULATORY_CARE_PROVIDER_SITE_OTHER): Payer: PPO

## 2020-05-05 DIAGNOSIS — R600 Localized edema: Secondary | ICD-10-CM

## 2020-05-05 DIAGNOSIS — E1151 Type 2 diabetes mellitus with diabetic peripheral angiopathy without gangrene: Secondary | ICD-10-CM | POA: Diagnosis not present

## 2020-05-05 DIAGNOSIS — Z794 Long term (current) use of insulin: Secondary | ICD-10-CM

## 2020-05-05 DIAGNOSIS — I10 Essential (primary) hypertension: Secondary | ICD-10-CM | POA: Diagnosis not present

## 2020-05-06 ENCOUNTER — Telehealth: Payer: Self-pay | Admitting: Cardiovascular Disease

## 2020-05-06 DIAGNOSIS — E1129 Type 2 diabetes mellitus with other diabetic kidney complication: Secondary | ICD-10-CM | POA: Diagnosis not present

## 2020-05-06 DIAGNOSIS — E1159 Type 2 diabetes mellitus with other circulatory complications: Secondary | ICD-10-CM | POA: Diagnosis not present

## 2020-05-06 DIAGNOSIS — E1142 Type 2 diabetes mellitus with diabetic polyneuropathy: Secondary | ICD-10-CM | POA: Diagnosis not present

## 2020-05-06 DIAGNOSIS — E1165 Type 2 diabetes mellitus with hyperglycemia: Secondary | ICD-10-CM | POA: Diagnosis not present

## 2020-05-06 DIAGNOSIS — Z794 Long term (current) use of insulin: Secondary | ICD-10-CM | POA: Diagnosis not present

## 2020-05-06 DIAGNOSIS — R809 Proteinuria, unspecified: Secondary | ICD-10-CM | POA: Diagnosis not present

## 2020-05-08 NOTE — Chronic Care Management (AMB) (Signed)
Chronic Care Management   Initial Visit Note   Name: Joseph [name] MRN: [id number] DOB: 04-22-1925  Primary Care Provider: [name]., MD Reason for referral : Chronic Care Management   Joseph [name] is a 86 y.o. year old male who is a primary care patient of [name]., MD. The CCM team was consulted for assistance with chronic disease management and care coordination. A telephonic assessment was conducted today.  Review of Joseph [name]' status, including review of consultants reports, relevant labs and test results was conducted today. Collaboration with appropriate care team members was performed as part of the comprehensive evaluation and provision of chronic care management services.    SDOH (Social Determinants of Health) assessments performed: Yes No interventions required at this time.    Medications: Outpatient Encounter Medications as of 05/05/2020  Medication Sig  . aspirin EC 81 MG tablet Take 1 tablet (81 mg total) by mouth daily.  Marland Kitchen atorvastatin (LIPITOR) 80 MG tablet Take 1 tablet (80 mg total) by mouth every evening.  . Cholecalciferol 125 MCG (5000 UT) capsule Take 5,000 Units by mouth at bedtime.  . clobetasol cream (TEMOVATE) 2.84 % Apply 1 application topically 2 (two) times daily. As needed to hands and elbows  . clopidogrel (PLAVIX) 75 MG tablet Take 1 tablet (75 mg total) by mouth daily.  . hydrocortisone 2.5 % cream Apply 1 application topically 2 (two) times daily. As needed to face, crease, groin and butt  . insulin aspart (NOVOLOG) 100 UNIT/ML injection Inject 0-9 Units into the skin 3 (three) times daily with meals. (Patient taking differently: Inject into the skin 3 (three) times daily with meals. 10 am, 15 lunch, 20 pm)  . insulin glargine (LANTUS) 100 UNIT/ML injection Inject 0.68 mLs (68 Units total) into the skin at bedtime. (Patient taking differently: Inject 65 Units into the skin at bedtime. )  . levothyroxine  (SYNTHROID, LEVOTHROID) 50 MCG tablet TAKE 1 TABLET BY MOUTH ONCE DAILY BEFORE BREAKFAST (Patient taking differently: Take 25 mcg by mouth daily before breakfast. )  . losartan (COZAAR) 100 MG tablet Take 1 tablet (100 mg total) by mouth at bedtime.  . Magnesium 250 MG TABS Take 250 mg by mouth as needed (swelling). Take when you take your Torsemide.  Marland Kitchen omeprazole (PRILOSEC) 20 MG capsule Take 1 capsule by mouth once daily (Patient taking differently: Take 20 mg by mouth at bedtime. )  . potassium chloride (KLOR-CON) 10 MEQ tablet Take 1 tablet (10 mEq total) by mouth daily.  . Continuous Blood Gluc Sensor (FREESTYLE LIBRE 14 DAY SENSOR) MISC USE AS DIRECTED EVERY 14 DAYS  . ketoconazole (NIZORAL) 2 % cream Apply 1 application topically 2 (two) times daily as needed for irritation. To groin, fave and buttocks.  . mometasone (ELOCON) 0.1 % ointment Apply topically daily. As needed to scalp and ears  . nitroGLYCERIN (NITROSTAT) 0.4 MG SL tablet Place 1 tablet (0.4 mg total) under the tongue every 5 (five) minutes as needed for chest pain.  Marland Kitchen torsemide (DEMADEX) 20 MG tablet Take 1 tablet (20 mg total) by mouth as needed (As needed for swelling and shortness of breath). Also take extra potassium 10 meq with this.   No facility-administered encounter medications on file as of 05/05/2020.     Objective:  BP Readings from Last 3 Encounters:  02/10/20 (!) 158/68  01/20/20 (!) 150/62  12/26/19 (!) 152/77     Wt Readings from Last 3 Encounters:  02/10/20 219 lb (  99.3 kg)  01/20/20 207 lb 8 oz (94.1 kg)  12/26/19 205 lb 6.4 oz (93.2 kg)    Lab Results  Component Value Date   HGBA1C 8.9 (H) 12/16/2019   Lab Results  Component Value Date   CHOL 121 12/17/2019   HDL 34 (L) 12/17/2019   LDLCALC 67 12/17/2019   TRIG 98 12/17/2019   CHOLHDL 3.6 12/17/2019     Joseph [name] was given information about Chronic Care Management services including:  1. CCM service includes personalized support  from designated clinical staff supervised by his physician, including individualized plan of care and coordination with other care providers 2. 24/7 contact phone numbers for assistance for urgent and routine care needs. 3. Service will only be billed when office clinical staff spend 20 minutes or more in a month to coordinate care. 4. Only one practitioner may furnish and bill the service in a calendar month. 5. The patient may stop CCM services at any time (effective at the end of the month) by phone call to the office staff. 6. The patient will be responsible for cost sharing (co-pay) of up to 20% of the service fee (after annual deductible is met).  Patient agreed to services and verbal consent obtained.    PLAN The care management team will follow-up with Mr. Pine again within the next few weeks.   Horris Latino [facility] Care Management [phone]

## 2020-05-11 ENCOUNTER — Ambulatory Visit: Payer: PPO | Admitting: Physician Assistant

## 2020-05-11 ENCOUNTER — Encounter: Payer: Self-pay | Admitting: Physician Assistant

## 2020-05-11 ENCOUNTER — Other Ambulatory Visit: Payer: Self-pay

## 2020-05-11 VITALS — BP 142/54 | HR 63 | Ht 67.0 in | Wt 207.5 lb

## 2020-05-11 DIAGNOSIS — R600 Localized edema: Secondary | ICD-10-CM | POA: Diagnosis not present

## 2020-05-11 DIAGNOSIS — I5032 Chronic diastolic (congestive) heart failure: Secondary | ICD-10-CM | POA: Diagnosis not present

## 2020-05-11 DIAGNOSIS — Z951 Presence of aortocoronary bypass graft: Secondary | ICD-10-CM | POA: Diagnosis not present

## 2020-05-11 DIAGNOSIS — I447 Left bundle-branch block, unspecified: Secondary | ICD-10-CM

## 2020-05-11 DIAGNOSIS — Z8679 Personal history of other diseases of the circulatory system: Secondary | ICD-10-CM | POA: Diagnosis not present

## 2020-05-11 DIAGNOSIS — I441 Atrioventricular block, second degree: Secondary | ICD-10-CM | POA: Diagnosis not present

## 2020-05-11 DIAGNOSIS — I10 Essential (primary) hypertension: Secondary | ICD-10-CM

## 2020-05-11 DIAGNOSIS — R079 Chest pain, unspecified: Secondary | ICD-10-CM

## 2020-05-11 DIAGNOSIS — E1151 Type 2 diabetes mellitus with diabetic peripheral angiopathy without gangrene: Secondary | ICD-10-CM | POA: Diagnosis not present

## 2020-05-11 DIAGNOSIS — R42 Dizziness and giddiness: Secondary | ICD-10-CM | POA: Diagnosis not present

## 2020-05-11 DIAGNOSIS — E785 Hyperlipidemia, unspecified: Secondary | ICD-10-CM | POA: Diagnosis not present

## 2020-05-11 DIAGNOSIS — I25118 Atherosclerotic heart disease of native coronary artery with other forms of angina pectoris: Secondary | ICD-10-CM | POA: Diagnosis not present

## 2020-05-11 DIAGNOSIS — I493 Ventricular premature depolarization: Secondary | ICD-10-CM

## 2020-05-11 DIAGNOSIS — Z87898 Personal history of other specified conditions: Secondary | ICD-10-CM | POA: Diagnosis not present

## 2020-05-11 NOTE — Patient Instructions (Signed)
Thank you for allowing the Chronic Care Management team to participate in your care.  Goals Addressed            This Visit's Progress   . Chronic Disease Management       CARE PLAN ENTRY (see longitudinal plan of care for additional care plan information)  Current Barriers:  . Chronic Disease Management support and education needs Diabetes, Hypertension, Hyperlipidemia and CHF.  Case Manager Clinical Goal(s):  Over the next 120 days, patient will: . Not require hospitalization or emergent care d/t complications r/t chronic illnesses. . Attend all scheduled medical appointments . Take all medications a prescribed . Monitor blood glucose levels and maintain a log. Marland Kitchen Routinely monitor BP and record readings. . Adhere to recommended cardiac prudent/diabetic diet. . Follow recommended safety measures to prevent falls and injuries.   Interventions:  . Inter-disciplinary care team collaboration (see longitudinal plan of care) . Reviewed medications. Encouraged to continue taking as prescribed and notify provider if unable to tolerate prescribed regimen. Encouraged to notify care management team with concerns regarding prescription costs. . Discussed established BP parameters and indications for notifying provider. Encouraged to monitor routinely and record readings. . Discussed s/sx of hypoglycemia and hyperglycemia along with recommended interventions. Encouraged to monitor blood glucose levels consistently and record readings. Unable to recall exact blood glucose readings but reports they are stored in his glucometer device. Reports readings have been within range.  . Provided information regarding complications related to CHF. Discussed worsening s/sx that require immediate medical attention.  . Discussed nutritional intake and compliance with recommended cardiac prudent/diabetic diet.  . Reviewed current activity tolerance and discussed safety and fall prevention measures. . Reviewed  pending/scheduled appointments. Encouraged to attend medical appointments as scheduled to prevent delays in care. Encouraged to notify care management team if transportation assistance is needed. . Discussed plans for ongoing care management  and follow up. Provided direct contact information for care management team  Patient Self Care Activities:  . Self administers medications as prescribed . Attends scheduled provider appointments . Calls pharmacy for medication refills . Performs ADL's independently   Initial goal documentation         Joseph [name] was given information about Chronic Care Management services including:  1. CCM service includes personalized support from designated clinical staff supervised by his physician, including individualized plan of care and coordination with other care providers 2. 24/7 contact phone numbers for assistance for urgent and routine care needs. 3. Service will only be billed when office clinical staff spend 20 minutes or more in a month to coordinate care. 4. Only one practitioner may furnish and bill the service in a calendar month. 5. The patient may stop CCM services at any time (effective at the end of the month) by phone call to the office staff. 6. The patient will be responsible for cost sharing (co-pay) of up to 20% of the service fee (after annual deductible is met).  Patient agreed to services and verbal consent obtained.    Joseph [name] verbalized understanding of the information discussed during the telephonic outreach today. Declined need for mailed/printed instructions.   The care management team will follow-up with Joseph [name] again within the next few weeks.   Horris Latino [facility] Care Management [phone]

## 2020-05-11 NOTE — Patient Instructions (Signed)
Medication Instructions:  Your physician recommends that you continue on your current medications as directed. Please refer to the Current Medication list given to you today.  *If you need a refill on your cardiac medications before your next appointment, please call your pharmacy*   Lab Work: Your physician recommends that you have lab work today(bmet, cbc, tsh)  If you have labs (blood work) drawn today and your tests are completely normal, you will receive your results only by: Marland Kitchen MyChart Message (if you have MyChart) OR . A paper copy in the mail If you have any lab test that is abnormal or we need to change your treatment, we will call you to review the results.   Testing/Procedures: None ordered   Follow-Up: At [facility], you and your health needs are our priority.  As part of our continuing mission to provide you with exceptional heart care, we have created designated Provider Care Teams.  These Care Teams include your primary Cardiologist (physician) and Advanced Practice Providers (APPs -  Physician Assistants and Nurse Practitioners) who all work together to provide you with the care you need, when you need it.  We recommend signing up for the patient portal called "MyChart".  Sign up information is provided on this After Visit Summary.  MyChart is used to connect with patients for Virtual Visits (Telemedicine).  Patients are able to view lab/test results, encounter notes, upcoming appointments, etc.  Non-urgent messages can be sent to your provider as well.   To learn more about what you can do with MyChart, go to [email or web address].    Your next appointment:   1 month(s)  The format for your next appointment:   In Person  Provider:    You may see Ida Rogue, MD or one of the following Advanced Practice Providers on your designated Care Team:    Murray Hodgkins, NP  Christell Faith, PA-C  Marrianne Mood, PA-C    Other Instructions 1- Ref to  Electrophysiologist Dr. Caryl Comes for ventricular escape rhythm at [facility]

## 2020-05-11 NOTE — Progress Notes (Signed)
Office Visit    Patient Name: Joseph [name] Date of Encounter: 05/11/2020  Primary Care Provider:  [name]., MD Primary Cardiologist:  Ida Rogue, MD  Chief Complaint    Chief Complaint  Patient presents with  . Other    Pt mentioned he had chest pain last week and lightheaded for 3 days. Meds reviewed verbally with pt.    86 year old male with history of CAD s/p four-vessel CABG in 1994, 11/2019 non-STEMI with subsequent LHC 12/17/2019 as outlined below, chronic combined CHF (EF 50 to 55%, 11/2019), aortic root / ascending Ao dilation (38 mm, 11/2019), mild MR, PAD with reported right SFA stenting, bilateral carotid dz with imaging in 2018 showing 1-39% stenosis, Mobitz type I AVB, SVT, NSVT, hyperlipidemia, poorly controlled DM2, hypothyroidism, history of tobacco use via pipe smoking for 15 to 20 years (quit 1982), arthritis of the knees, history of fatigue, shortness of breath, generalized weakness (felt to be his anginal equivalent), and here today for recent episode of chest pain and subsequent lightheadedness.  Past Medical History    Past Medical History:  Diagnosis Date  . CAD (coronary artery disease)    a. s/p CABG;  b. 07/2012 Cath: 3VD including 80 dLCX (med Rx), 4/4 patent grafts.  . Chronic diastolic CHF (congestive heart failure) ([place])    a. 07/2012 Echo: EF 55-60%, no rwma, Gr 1 DD, mild MR;  04/2014 Echo: EF 55-60%, no rwma, mild MR, mildly dil LA.  . Diabetes mellitus, type 2 ([place])   . HTN (hypertension)   . Hyperlipidemia   . Hypothyroidism   . Lower extremity edema    a. 03/2014 amlodipine d/c'd.  Marland Kitchen Spinal stenosis    Past Surgical History:  Procedure Laterality Date  . APPENDECTOMY    . CARDIAC CATHETERIZATION  07/2012   [facility]/no stents  . CHOLECYSTECTOMY    . CORNEAL TRANSPLANT    . CORONARY ARTERY BYPASS GRAFT  1994  . HEMORRHOID SURGERY    . LEFT HEART CATH AND CORS/GRAFTS ANGIOGRAPHY N/A 12/17/2019   Procedure: LEFT HEART CATH AND  CORS/GRAFTS ANGIOGRAPHY;  Surgeon: Nelva Bush, MD;  Location: [facility];  Service: Cardiovascular;  Laterality: N/A;  . PROSTATE BIOPSY    . TOE SURGERY     ingrown toe nail    Allergies  No Known Allergies  History of Present Illness    Joseph [name] is a 85 y.o. male with PMH as above.    He has a significant history of CAD, including remote history of CABG x4 in 1994.  He underwent LHC 08/16/2012 in the setting of progressive shortness of breath and fatigue, felt to possibly be his anginal equivalent.  This LHC showed severe three-vessel disease with patent grafts x4.  There is 80% disease of a small to moderate size distal left circumflex.  EF 45 to 50%.  Findings were discussed with interventional cardiology and medical management recommended for the distal left circumflex disease.  06/2012 echo showed EF 55 to 60%, mild MR, no regional wall motion abnormalities, and G1DD.  He was seen 04/2014 with progressive DOE and HCTZ changed to Lasix. He diuresed 12 pounds but still felt SOB after diuresis.  Notes indicated suspicion that SOB may be his anginal equivalent.  Echo 04/2014 showed EF 55 to 60% and mild MR, and mild LAE.  Per EMR, cardiac catheterization was recommended; however, this LHC was declined / deferred by the patient.  Imdur was added.   He  was seen 03/17/2016 with decline over the previous several months, including inability to walk greater than 100 feet without having to stop and take a break to rest.  He was unable to take the trash out or climbing stairs.  His weight was stable at 230 pounds with 3 pillow orthopnea in the setting of GERD.  Cardiac catheterization was recommended and scheduled 03/31/2016; however, this was subsequently cancelled after he was admitted before his LHC to [facility] with AKI and hypoxia, as well as cholecystitis.  He underwent cholecystectomy.  He was seen at follow-up 05/16/2016 and was reportedly doing well with no indication for rescheduling  LHC noted by his primary cardiologist due to lack of angina.  He was admitted 11/2019 after report of chest pain in the office that had started 4 days earlier.  Chest pain had started at rest during the evening and was associated with some belching, which relieved the discomfort.  He had reportedly had a large dinner that night and also was often constipated. However, the CP still continued and recommendation was for admission given Tn of 588 in office. EKG showed NSR with PVCs in a bigeminal pattern.  Subsequent EKG showed NSR, Mobitz type I, AVB, LAD, LBBB.  Telemetry was significant for Mobitz type I second-degree AV block.  Plan on admission was for cardiac catheterization 2/2 non-STEMI.    LHC 12/17/2019 was performed and showed severe CAD.  Cath showed 80% dLMCA, 100% pLAD, and diffuse LCx/OM disease.  90% MRCA stenosis and CTO of the RPDA was also present.  He had a patent LIMA-D3 with backfilling of the mid/distal LAD. Also noted was patent SVG-D1-D2, SVG to OM 1-OM 2, and SVG-RPDA-RPL.  Mild to moderately elevated LVEDP and moderately reduced LVSF was also noted.  Subsequent echo showed EF 50 to 55%/low normal function with mild LVH, moderate hypokinesis of the left ventricle and mid anterior septal wall, G1DD, LV RWMA, mildly reduced RVSF, mild LAE, mild MR, trivial TR, mild to moderate AV sclerosis/calcification without evidence of aortic stenosis, and aortic dilation/dilation of the aortic root and ascending aorta measuring 38 mm.  Recommendations were for medical therapy with DAPT (ASA and clopidogrel) for at least 12 months.    Given his telemetry and EKG findings, EP consultation of AV block was recommended, especially if ongoing symptoms.  He subsequently underwent cardiac monitoring as below with NSR (average heart rate 62 bpm), 4 runs of VT (fastest 4 beats & max rate 222 bpm; longest 15 beats and average rate 120 bpm), 3 runs of SVT (fastest 5 beats, max rate 135 bpm; longest 6 beats, average  rate 100 bpm), second-degree AV block Mobitz type I, isolated PACs, atrial couplets/triplets, occasional PVCs, rare ventricular couplets and triplets, and ventricular bigeminy and trigeminy.  Today, 05/11/2020, he presents to clinic and notes that he has been under a significant amount of personal and financial stress lately.  He notes an episode of chest pain that was identical to that which occurred prior to his [facility] 12/17/2019.  CP was further described as occurring while he was trying to get the sheets on his mattress.  He reports that the CP lasted only minutes and was less severe but otherwise identical to before his cath as above.  Following resolution of his CP, he subsequently felt severe dizziness for the next 2 days but without falls or LOC.  Today, he denies any chest pain or further dizziness. He denies palpitations, pnd, orthopnea,syncope, weight gain, or early satiety.  He reports  chronic and unchanged LEE. He reported significant stress due to taking care of his wife.  He did not have a regular exercise routine.  He denied any further chest pain/dizziness since his initial event.  No signs or symptoms of bleeding.  He reported medication compliance.  Home Medications    Prior to Admission medications   Medication Sig Start Date End Date Taking? Authorizing Provider  aspirin EC 81 MG tablet Take 1 tablet (81 mg total) by mouth daily. 03/17/16  Yes Dunn, Areta Haber, PA-C  atorvastatin (LIPITOR) 80 MG tablet Take 1 tablet (80 mg total) by mouth every evening. 01/20/20  Yes Minna Merritts, MD  Cholecalciferol 125 MCG (5000 UT) capsule Take 5,000 Units by mouth at bedtime.   Yes [provider]  clobetasol cream (TEMOVATE) 8.93 % Apply 1 application topically 2 (two) times daily. As needed to hands and elbows   Yes [provider]  clopidogrel (PLAVIX) 75 MG tablet Take 1 tablet (75 mg total) by mouth daily. 01/20/20  Yes Gollan, Kathlene November, MD  Continuous Blood Gluc Sensor (FREESTYLE  LIBRE 14 DAY SENSOR) MISC USE AS DIRECTED EVERY 14 DAYS 01/16/20  Yes [name]., MD  hydrocortisone 2.5 % cream Apply 1 application topically 2 (two) times daily. As needed to face, crease, groin and butt 12/26/19  Yes [name]., MD  insulin aspart (NOVOLOG) 100 UNIT/ML injection Inject 0-9 Units into the skin 3 (three) times daily with meals. Patient taking differently: Inject into the skin 3 (three) times daily with meals. 10 am, 15 lunch, 20 pm 03/28/16  Yes Demetrios Loll, MD  insulin glargine (LANTUS) 100 UNIT/ML injection Inject 0.68 mLs (68 Units total) into the skin at bedtime. Patient taking differently: Inject 65 Units into the skin at bedtime.  03/28/16  Yes Demetrios Loll, MD  ketoconazole (NIZORAL) 2 % cream Apply 1 application topically 2 (two) times daily as needed for irritation. To groin, fave and buttocks. 12/26/19  Yes [name]., MD  levothyroxine (SYNTHROID, LEVOTHROID) 50 MCG tablet TAKE 1 TABLET BY MOUTH ONCE DAILY BEFORE BREAKFAST Patient taking differently: Take 25 mcg by mouth daily before breakfast.  01/01/19  Yes [name]., MD  losartan (COZAAR) 100 MG tablet Take 1 tablet (100 mg total) by mouth at bedtime. 01/20/20  Yes Gollan, Kathlene November, MD  Magnesium 250 MG TABS Take 250 mg by mouth as needed (swelling). Take when you take your Torsemide.   Yes [provider]  mometasone (ELOCON) 0.1 % ointment Apply topically daily. As needed to scalp and ears   Yes [provider]  nitroGLYCERIN (NITROSTAT) 0.4 MG SL tablet Place 1 tablet (0.4 mg total) under the tongue every 5 (five) minutes as needed for chest pain. 12/13/19  Yes Minna Merritts, MD  omeprazole (PRILOSEC) 20 MG capsule Take 1 capsule by mouth once daily Patient taking differently: Take 20 mg by mouth at bedtime.  09/17/19  Yes [name]., MD  potassium chloride (KLOR-CON) 10 MEQ tablet Take 1 tablet (10 mEq total) by mouth daily. 01/20/20  Yes Minna Merritts, MD  torsemide (DEMADEX) 20 MG tablet Take 1 tablet (20 mg total) by mouth as needed (As needed for swelling and shortness of breath). Also take extra potassium 10 meq with this. 01/20/20 04/19/20  Minna Merritts, MD    Review of Systems    He reports recent CP similar to that before his [facility] 11/2019 (yet  less severe in intensity), followed by 2 days of dizziness; however, this has not recurred since that time. He denies palpitations, dyspnea, pnd, orthopnea, n, v, dizziness, syncope, weight gain, or early satiety.   He reports chronic lower extremity edema, unchanged from previous visits.  All other systems reviewed and are otherwise negative except as noted above.  Physical Exam    VS:  BP (!) 142/54 (BP Location: Left Arm, Patient Position: Sitting, Cuff Size: Normal)   Pulse 63   Ht 5\' 7"  (1.702 m)   Wt 207 lb 8 oz (94.1 kg)   SpO2 98%   BMI 32.50 kg/m  , BMI Body mass index is 32.5 kg/m. GEN: Elderly male, in no acute distress. HEENT: normal. Neck: Supple, no JVD, carotid bruits, or masses. Cardiac: RRR with extrasystole / pauses appreciated, 1/6 systolic murmur. No rubs, or gallops. No clubbing, cyanosis. Mild LEE.  Radials/DP/PT 2+ and equal bilaterally.  Respiratory:  Respirations regular and unlabored, clear to auscultation bilaterally. GI: Soft, nontender, nondistended, BS + x 4. MS: no deformity or atrophy. Skin: warm and dry, no rash. Neuro:  Strength and sensation are intact. Psych: Normal affect.  Accessory Clinical Findings    ECG personally reviewed by me today -  63bpm, known LBBB, LAD, versus second degree MobitzI/II AVB / PVCs versus ventricular escape beats - no acute changes.  VITALS Reviewed today   Temp Readings from Last 3 Encounters:  02/10/20 (!) 97.3 F (36.3 C) (Temporal)  12/26/19 (!) 96.9 F (36.1 C) (Temporal)  12/19/19 98.4 F (36.9 C) (Oral)   BP Readings from Last 3 Encounters:  05/11/20 (!) 142/54  02/10/20 (!) 158/68  01/20/20  (!) 150/62   Pulse Readings from Last 3 Encounters:  05/11/20 63  02/10/20 84  01/20/20 (!) 103    Wt Readings from Last 3 Encounters:  05/11/20 207 lb 8 oz (94.1 kg)  02/10/20 219 lb (99.3 kg)  01/20/20 207 lb 8 oz (94.1 kg)     LABS  reviewed today   Lab Results  Component Value Date   WBC 7.8 12/18/2019   HGB 13.2 12/18/2019   HCT 39.0 12/18/2019   MCV 89.2 12/18/2019   PLT 170 12/18/2019   Lab Results  Component Value Date   CREATININE 0.98 12/25/2019   BUN 14 12/25/2019   NA 138 12/25/2019   K 5.0 12/25/2019   CL 105 12/25/2019   CO2 24 12/25/2019   Lab Results  Component Value Date   ALT 21 12/16/2019   AST 24 12/16/2019   ALKPHOS 47 12/16/2019   BILITOT 0.6 12/16/2019   Lab Results  Component Value Date   CHOL 121 12/17/2019   HDL 34 (L) 12/17/2019   LDLCALC 67 12/17/2019   TRIG 98 12/17/2019   CHOLHDL 3.6 12/17/2019    Lab Results  Component Value Date   HGBA1C 8.9 (H) 12/16/2019   Lab Results  Component Value Date   TSH 3.306 12/17/2019     STUDIES/PROCEDURES reviewed today   Cardiac monitor 12/18/2019 Normal sinus rhythm avg HR of 62 bpm. 4 Ventricular Tachycardia runs occurred, the run with the fastest interval lasting 4 beats with a max rate of 222 bpm, the longest lasting 15 beats with an avg rate of 120 bpm.  3 Supraventricular Tachycardia runs occurred, the run with the fastest interval lasting 5 beats with a max rate of 135 bpm, the longest lasting 6 beats with an avg rate of 100 bpm.  Second Degree AV Block-Mobitz I (  Wenckebach) was present.  Isolated SVEs were rare (<1.0%), SVE Couplets were rare (<1.0%), and SVE Triplets were rare (<1.0%). Isolated VEs were occasional (3.5%, 39247), VE Couplets were rare (<1.0%, 1522), and VE Triplets were rare (<1.0%, 10). Ventricular Bigeminy and Trigeminy were present. No patient triggered events recorded.  Echocardiogram 12/16/2018: 1. Left ventricular ejection fraction, by visual  estimation, is 50 to  55%. The left ventricle has low normal function. There is mildly increased  left ventricular hypertrophy.  2. Moderate hypokinesis of the left ventricular, mid anteroseptal wall.  3. Left ventricular diastolic parameters are consistent with Grade I  diastolic dysfunction (impaired relaxation).  4. The left ventricle demonstrates regional wall motion abnormalities.  5. Global right ventricle has mildly reduced systolic function.The right  ventricular size is normal. No increase in right ventricular wall  thickness.  6. Left atrial size was mildly dilated.  7. Right atrial size was normal.  8. The mitral valve is normal in structure. Mild mitral valve  regurgitation. No evidence of mitral stenosis.  9. The tricuspid valve is normal in structure.  10. The tricuspid valve is normal in structure. Tricuspid valve  regurgitation is trivial.  11. The aortic valve is tricuspid. Aortic valve regurgitation is not  visualized. Mild to moderate aortic valve sclerosis/calcification without  any evidence of aortic stenosis.  12. The pulmonic valve was not well visualized. Pulmonic valve  regurgitation is not visualized.  13. Aortic dilatation noted.  14. There is mild dilatation of the aortic root and of the ascending aorta  measuring 38 mm.  15. TR signal is inadequate for assessing pulmonary artery systolic  pressure.  16. The inferior vena cava is normal in size with greater than 50%  respiratory variability, suggesting right atrial pressure of 3 mmHg.  17. The interatrial septum was not well visualized.   LHC 12/17/2019  The left ventricular ejection fraction is 35-45% by visual estimate. Conclusions: 1. Severe native CAD, including 80% distal LMCA, 100% proximal LAD and diffuse LCx/OM disease. 90% mid RCA stenosis and CTO of rPDA also present. 2. Patent LIMA->D3 with backfilling of the mid/distal LADl. 3. Patent SVG-D1-D2, SVG-OM1-OM2, and  SVG-rPDA-rPL. 4. Mildly to moderately elevated LVEDP. 5. At least moderately reduced left ventricular systolic function. Recommendations: 1. Medical therapy; recommend up to 12 months of DAPT with ASA and clopidogrel, as tolerated. 2. Aggressive secondary prevention of coronary artery disease. 3. Obtain echocardiogram to better assess LVEF.Marland Kitchen 4. Monitor on telemetry. Consider EP consultation of AV block persists, particularly if the patient is symptoms.  US Carotid 2018 Notes recorded by Minna Merritts, MD on 03/13/2017 at 6:28 PM EDT Carotid ultrasound Less than 39% stenosis bilaterally Stable  repeat ultrasound only as needed Dr. Rockey Situ, MD  ---  Orthostatics 05/11/20 Orthostatic Vital Signs:    BP  HR  Lying:  181/66  51  Sitting:  166/71  80 Standing:  124/70  102 2 minutes: 157/65  157/65  Assessment & Plan    Chest pain and dizziness CAD s/p CABG x4 and non-ST elevation myocardial infarction --Reports CP similar to that before his 12/17/2019 LHC.  Subsequently, he was dizzy for approximately 2 days.  CP onset was while he was putting sheets on his bed.  Significant history of CAD as outlined above and HPI.  Most recent [facility] 1/26 as above with recommendation for medical management with DAPT.  Echo as above. --Given his CP with report that it was similar to his previous NSTEMI, recommendation was for further  ischemic work-up with echo +/- MPI +/- LHC; however, given his current financial and personal stressors as above, this was politely declined at this time.  He is aware that he should call the office or present to the ED if further episodes of chest pain. Reassess at RTC. --Given dizziness and CP, referral to EP provided today with patient agreeable.  At this time, he is hemodynamically stable and chronotropic competence demonstrated during visit. His orthostatics as above are positive but with patient denying any sx when moving positions. He will call the office if any  further sx or present to the ED. --Continue medical management.  He reports compliance with both ASA 81 mg and Plavix.  No signs or symptoms of bleeding. BB discontinued by his primary cardiologist for AVB as below. Continue statin and PRN SL nitro. Obtain BMET, CBC, and TSH.   History of Mobitz I AVB, Bradycardia History of NSVT, pSVT, AT Frequent PVCs / Ventricular Couplets  --Reports dizziness x2 days after an episode of CP with unclear etiology and concern for SSS or progression to CHB.  History of Mobitz I AVB, bradycardia, AT, SVT, and NSVT in the past. Previous monitor as above. BB discontinued due to AVB at previous visit with return of NSVT and SVT, as well as PVCs.  Will refer to EP for further evaluation. Will also obtain BMET and TSH. He will present to the ED if any further dizziness or concerning sx or call the office, given his hesitation to present to the ED 2/2 financial stressors.  Orthostatic hypotension --As above, orthostatics today positive with patient denying any sx at that time. See above. Consider as contributing to his sx.  Chronic combined heart failure (low normal EF 50-55%) --Appears euvolemic and denies s/sx of worsening HF. Unchanged, chronic LEE as below.. Wt down from previous visits. 1/26 echo as above with low nl EF 50-55%, mild LVH, moderate hypokinesis as above, G1DD, mildly reduced RVSF, and mild MR.  Continue current medications with losartan 100mg  and torsemide 20mg  with KCL tab 26mEq. As above, will check BMET/ electrolytes and renal function today. No BB, given discontinued at previous visits 2/2 AVB. Given faster runs of SVT and NSVT with AVB and BB discontinued, referral provided to EP as above.  .   Mild MR --Periodic echo recommended to monitor.HR and BP control recommended. Continue to monitor.   Aortic root dilation, ascending aorta dilation (32mm) --Continue to monitor with periodic echo. HR and BP control, as well as cholesterol control. Avoid FQ  and heavy lifting.    HTN --BP mildly elevated. Will hold off on any medication changes pending EP evaluation. BB has been discontinued at previous visits 2/2 AVB. Continue ARB. BMET pending.  HLD --Continue statin. LDL goal <70.  PAD s/p R SFA stenting Bilateral carotid dz --Denies s/sx of claudication s/p stenting.  No amaurosis fugax. Does report dizziness as above with no significant bruit appreciated on exam. If dizziness continues with ischemic and EP workup without significant findings, consider repeat carotid US given previous study was in 2018. Continue high intensity statin and DAPT with ASA and Plavix. Glycemic control recommended, in addition to LDL goal <70.   DM2 --Strict glycemic control recommended for risk factor modification, as well as activity as tolerated.   Hypothyroidism --Check TSH, given recent dizziness and EKG as above.  Continue medication per PCP.  Medication changes: None Labs ordered: BMET, CBC, TSH Studies / Imaging ordered: None Future considerations: Patient deferred ischemic workup. Reassess at  RTC. Disposition: RTC following EP appointment     Arvil Chaco, PA-C 05/11/2020

## 2020-05-12 LAB — CBC
Hematocrit: 40.6 % (ref 37.5–51.0)
Hemoglobin: 13.8 g/dL (ref 13.0–17.7)
MCH: 30.4 pg (ref 26.6–33.0)
MCHC: 34 g/dL (ref 31.5–35.7)
MCV: 89 fL (ref 79–97)
Platelets: 222 10*3/uL (ref 150–450)
RBC: 4.54 x10E6/uL (ref 4.14–5.80)
RDW: 13.3 % (ref 11.6–15.4)
WBC: 5.6 10*3/uL (ref 3.4–10.8)

## 2020-05-12 LAB — BASIC METABOLIC PANEL
BUN/Creatinine Ratio: 17 (ref 10–24)
BUN: 19 mg/dL (ref 10–36)
CO2: 23 mmol/L (ref 20–29)
Calcium: 9.2 mg/dL (ref 8.6–10.2)
Chloride: 104 mmol/L (ref 96–106)
Creatinine, Ser: 1.15 mg/dL (ref 0.76–1.27)
GFR calc Af Amer: 64 mL/min/{1.73_m2} (ref >59)
GFR calc non Af Amer: 55 mL/min/{1.73_m2} — ABNORMAL LOW (ref >59)
Glucose: 109 mg/dL — ABNORMAL HIGH (ref 65–99)
Potassium: 4.5 mmol/L (ref 3.5–5.2)
Sodium: 141 mmol/L (ref 134–144)

## 2020-05-12 LAB — TSH: TSH: 2.22 u[IU]/mL (ref 0.450–4.500)

## 2020-05-14 ENCOUNTER — Encounter: Payer: Self-pay | Admitting: *Deleted

## 2020-05-21 ENCOUNTER — Ambulatory Visit (INDEPENDENT_AMBULATORY_CARE_PROVIDER_SITE_OTHER): Payer: PPO

## 2020-05-21 DIAGNOSIS — Z794 Long term (current) use of insulin: Secondary | ICD-10-CM | POA: Diagnosis not present

## 2020-05-21 DIAGNOSIS — E1151 Type 2 diabetes mellitus with diabetic peripheral angiopathy without gangrene: Secondary | ICD-10-CM | POA: Diagnosis not present

## 2020-05-21 DIAGNOSIS — R600 Localized edema: Secondary | ICD-10-CM

## 2020-05-21 NOTE — Chronic Care Management (AMB) (Signed)
Chronic Care Management   Follow Up Note   05/21/2020 Name: Joseph [name] MRN: [id number] DOB: [birth year]  Primary Care Provider: Jerrol [name]., MD Reason for referral : Chronic Care Management  Joseph [name] is a 86 y.o. year old male who is a primary care patient of Joseph [name]., MD. He is currently engaged with the chronic care management team. A routine telephonic outreach was conducted today.  Review of Joseph [name] status, including review of consultants reports, relevant labs and test results was conducted today. Collaboration with appropriate care team members was performed as part of the comprehensive evaluation and provision of chronic care management services.    SDOH (Social Determinants of Health) assessments performed: Yes No interventions indicated.    Outpatient Encounter Medications as of 05/21/2020  Medication Sig  . aspirin EC 81 MG tablet Take 1 tablet (81 mg total) by mouth daily.  Marland Kitchen atorvastatin (LIPITOR) 80 MG tablet Take 1 tablet (80 mg total) by mouth every evening.  . Cholecalciferol 125 MCG (5000 UT) capsule Take 5,000 Units by mouth at bedtime.  . clobetasol cream (TEMOVATE) 6.96 % Apply 1 application topically 2 (two) times daily. As needed to hands and elbows  . clopidogrel (PLAVIX) 75 MG tablet Take 1 tablet (75 mg total) by mouth daily.  . Continuous Blood Gluc Sensor (FREESTYLE LIBRE 14 DAY SENSOR) MISC USE AS DIRECTED EVERY 14 DAYS  . hydrocortisone 2.5 % cream Apply 1 application topically 2 (two) times daily. As needed to face, crease, groin and butt  . insulin aspart (NOVOLOG) 100 UNIT/ML injection Inject 0-9 Units into the skin 3 (three) times daily with meals. (Patient taking differently: Inject into the skin 3 (three) times daily with meals. 10 am, 15 lunch, 20 pm)  . insulin glargine (LANTUS) 100 UNIT/ML injection Inject 0.68 mLs (68 Units total) into the skin at bedtime. (Patient taking differently: Inject 65 Units into  the skin at bedtime. )  . ketoconazole (NIZORAL) 2 % cream Apply 1 application topically 2 (two) times daily as needed for irritation. To groin, fave and buttocks.  Marland Kitchen levothyroxine (SYNTHROID, LEVOTHROID) 50 MCG tablet TAKE 1 TABLET BY MOUTH ONCE DAILY BEFORE BREAKFAST (Patient taking differently: Take 25 mcg by mouth daily before breakfast. )  . losartan (COZAAR) 100 MG tablet Take 1 tablet (100 mg total) by mouth at bedtime.  . Magnesium 250 MG TABS Take 250 mg by mouth as needed (swelling). Take when you take your Torsemide.  . mometasone (ELOCON) 0.1 % ointment Apply topically daily. As needed to scalp and ears  . nitroGLYCERIN (NITROSTAT) 0.4 MG SL tablet Place 1 tablet (0.4 mg total) under the tongue every 5 (five) minutes as needed for chest pain.  Marland Kitchen omeprazole (PRILOSEC) 20 MG capsule Take 1 capsule by mouth once daily (Patient taking differently: Take 20 mg by mouth at bedtime. )  . potassium chloride (KLOR-CON) 10 MEQ tablet Take 1 tablet (10 mEq total) by mouth daily.  Marland Kitchen torsemide (DEMADEX) 20 MG tablet Take 1 tablet (20 mg total) by mouth as needed (As needed for swelling and shortness of breath). Also take extra potassium 10 meq with this.   No facility-administered encounter medications on file as of 05/21/2020.     Goals Addressed            This Visit's Progress   . Chronic Disease Management       CARE PLAN ENTRY (see longitudinal plan of care for additional care plan information)  Current Barriers:  . Chronic Disease Management support and education needs Diabetes, Hypertension, Hyperlipidemia and CHF.  Case Manager Clinical Goal(s):  Over the next 120 days, patient will: . Not require hospitalization or emergent care d/t complications r/t chronic illnesses. . Attend all scheduled medical appointments . Take all medications a prescribed . Monitor blood glucose levels and maintain a log. Marland Kitchen Routinely monitor BP and record readings. . Adhere to recommended cardiac  prudent/diabetic diet. . Follow recommended safety measures to prevent falls and injuries.   Interventions:  . Inter-disciplinary care team collaboration (see longitudinal plan of care) . Discussed blood glucose readings. Reports readings have been within range. Reports fasting reading of 81mg /dl today.  . Discussed worsening s/sx r/t CHF that require immediate medical attention. Reports edema to lower extremities but otherwise doing well. Taking torsemide and monitoring weight as recommended. No complaints of shortness of breath, chest pain or decline in activity tolerance. Reports morning weight of 204 lbs. . Reviewed pending/scheduled appointments. Pending evaluation by Electrophysiologist. Pending PCP outreach on 06/11/20.   Patient Self Care Activities:  . Self administers medications as prescribed . Attends scheduled provider appointments . Calls pharmacy for medication refills . Performs ADL's independently . Performs IADL's independently.   Please see past updates related to this goal by clicking on the "Past Updates" button in the selected goal         PLAN The care management team will follow-up with Joseph [name] next month.   [facility] Care Management [phone]

## 2020-05-21 NOTE — Chronic Care Management (AMB) (Signed)
Chronic Care Management   Initial Visit Note   Name: Joseph [name] MRN: [id number] DOB: 09/09/1925  Primary Care Provider: Maple Hudson., MD Reason for referral : Chronic Care Management   Joseph [name] is a 86 y.o. year old male who is a primary care patient of Maple Hudson., MD. The CCM team was consulted for assistance with chronic disease management and care coordination. A telephonic assessment was conducted today.  Review of Mr. Helsley' status, including review of consultants reports, relevant labs and test results was conducted today. Collaboration with appropriate care team members was performed as part of the comprehensive evaluation and provision of chronic care management services.    SDOH (Social Determinants of Health) assessments performed: Yes No interventions required at this time.    Medications: Outpatient Encounter Medications as of 05/05/2020  Medication Sig  . aspirin EC 81 MG tablet Take 1 tablet (81 mg total) by mouth daily.  Marland Kitchen atorvastatin (LIPITOR) 80 MG tablet Take 1 tablet (80 mg total) by mouth every evening.  . Cholecalciferol 125 MCG (5000 UT) capsule Take 5,000 Units by mouth at bedtime.  . clobetasol cream (TEMOVATE) 0.05 % Apply 1 application topically 2 (two) times daily. As needed to hands and elbows  . clopidogrel (PLAVIX) 75 MG tablet Take 1 tablet (75 mg total) by mouth daily.  . hydrocortisone 2.5 % cream Apply 1 application topically 2 (two) times daily. As needed to face, crease, groin and butt  . insulin aspart (NOVOLOG) 100 UNIT/ML injection Inject 0-9 Units into the skin 3 (three) times daily with meals. (Patient taking differently: Inject into the skin 3 (three) times daily with meals. 10 am, 15 lunch, 20 pm)  . insulin glargine (LANTUS) 100 UNIT/ML injection Inject 0.68 mLs (68 Units total) into the skin at bedtime. (Patient taking differently: Inject 65 Units into the skin at bedtime. )  . levothyroxine  (SYNTHROID, LEVOTHROID) 50 MCG tablet TAKE 1 TABLET BY MOUTH ONCE DAILY BEFORE BREAKFAST (Patient taking differently: Take 25 mcg by mouth daily before breakfast. )  . losartan (COZAAR) 100 MG tablet Take 1 tablet (100 mg total) by mouth at bedtime.  . Magnesium 250 MG TABS Take 250 mg by mouth as needed (swelling). Take when you take your Torsemide.  Marland Kitchen omeprazole (PRILOSEC) 20 MG capsule Take 1 capsule by mouth once daily (Patient taking differently: Take 20 mg by mouth at bedtime. )  . potassium chloride (KLOR-CON) 10 MEQ tablet Take 1 tablet (10 mEq total) by mouth daily.  . Continuous Blood Gluc Sensor (FREESTYLE LIBRE 14 DAY SENSOR) MISC USE AS DIRECTED EVERY 14 DAYS  . ketoconazole (NIZORAL) 2 % cream Apply 1 application topically 2 (two) times daily as needed for irritation. To groin, fave and buttocks.  . mometasone (ELOCON) 0.1 % ointment Apply topically daily. As needed to scalp and ears  . nitroGLYCERIN (NITROSTAT) 0.4 MG SL tablet Place 1 tablet (0.4 mg total) under the tongue every 5 (five) minutes as needed for chest pain.  Marland Kitchen torsemide (DEMADEX) 20 MG tablet Take 1 tablet (20 mg total) by mouth as needed (As needed for swelling and shortness of breath). Also take extra potassium 10 meq with this.   No facility-administered encounter medications on file as of 05/05/2020.     Objective:  BP Readings from Last 3 Encounters:  05/11/20 (!) 142/54  02/10/20 (!) 158/68  01/20/20 (!) 150/62     Wt Readings from Last 3 Encounters:  05/11/20 207 lb  8 oz (94.1 kg)  02/10/20 219 lb (99.3 kg)  01/20/20 207 lb 8 oz (94.1 kg)    Lab Results  Component Value Date   HGBA1C 8.9 (H) 12/16/2019   Lab Results  Component Value Date   CHOL 121 12/17/2019   HDL 34 (L) 12/17/2019   LDLCALC 67 12/17/2019   TRIG 98 12/17/2019   CHOLHDL 3.6 12/17/2019      Goals Addressed            This Visit's Progress   . Chronic Disease Management       CARE PLAN ENTRY (see longitudinal plan of  care for additional care plan information)  Current Barriers:  . Chronic Disease Management support and education needs Diabetes, Hypertension, Hyperlipidemia and CHF.  Case Manager Clinical Goal(s):  Over the next 120 days, patient will: . Not require hospitalization or emergent care d/t complications r/t chronic illnesses. . Attend all scheduled medical appointments . Take all medications a prescribed . Monitor blood glucose levels and maintain a log. Marland Kitchen Routinely monitor BP and record readings. . Adhere to recommended cardiac prudent/diabetic diet. . Follow recommended safety measures to prevent falls and injuries.   Interventions:  . Inter-disciplinary care team collaboration (see longitudinal plan of care) . Reviewed medications. Encouraged to continue taking as prescribed and notify provider if unable to tolerate prescribed regimen. Encouraged to notify care management team with concerns regarding prescription costs. . Discussed established BP parameters and indications for notifying provider. Encouraged to monitor routinely and record readings. . Discussed s/sx of hypoglycemia and hyperglycemia along with recommended interventions. Encouraged to monitor blood glucose levels consistently and record readings. Unable to recall exact blood glucose readings but reports they are stored in his glucometer device. Reports readings have been within range.  . Provided information regarding complications related to CHF. Discussed worsening s/sx that require immediate medical attention.  . Discussed nutritional intake and compliance with recommended cardiac prudent/diabetic diet.  . Reviewed current activity tolerance and discussed safety and fall prevention measures. . Reviewed pending/scheduled appointments. Encouraged to attend medical appointments as scheduled to prevent delays in care. Encouraged to notify care management team if transportation assistance is needed. . Discussed plans for  ongoing care management  and follow up. Provided direct contact information for care management team  Patient Self Care Activities:  . Self administers medications as prescribed . Attends scheduled provider appointments . Calls pharmacy for medication refills . Performs ADL's independently   Initial goal documentation       Mr. Gasaway was given information about Chronic Care Management services including:  1. CCM service includes personalized support from designated clinical staff supervised by his physician, including individualized plan of care and coordination with other care providers 2. 24/7 contact phone numbers for assistance for urgent and routine care needs. 3. Service will only be billed when office clinical staff spend 20 minutes or more in a month to coordinate care. 4. Only one practitioner may furnish and bill the service in a calendar month. 5. The patient may stop CCM services at any time (effective at the end of the month) by phone call to the office staff. 6. The patient will be responsible for cost sharing (co-pay) of up to 20% of the service fee (after annual deductible is met).  Patient agreed to services and verbal consent obtained.    PLAN The care management team will follow-up with Mr. Klang again within the next few weeks.   [facility] Care [facility] (  [phone]       

## 2020-06-02 NOTE — Progress Notes (Signed)
Cardiology Office Note    Date:  06/08/2020   ID:  Joseph [name], DOB November 17, 1925, MRN [id number]  PCP:  [name]., MD  Cardiologist:  Ida Rogue, MD  Electrophysiologist:  None   Chief Complaint: Follow up  History of Present Illness:   Joseph [name] is a 86 y.o. male with history of CAD status post CABG in 1994, HFpEF, paroxysmal SVT, NSVT, LBBB, PAD status post right SFA stenting followed by vascular surgery, DM2, HTN, HLD, hypothyroidism, spinal stenosis, obesity, prior tobacco use smoking for 15 to 20 years quitting in [id number], and arthritis of the knees who presents for follow-up of dizziness.  Patient previously underwent remote four-vessel CABG in 1994.  LHC in 07/2012 in the setting of progressive shortness of breath and fatigue felt to be his anginal equivalent showed severe native vessel CAD with all grafts patent.  EF 45 to 50%.  Medical management was advised.  Echo in 06/2012 showed an EF of 55 to 60%, no regional wall motion abnormalities, grade 1 diastolic dysfunction, and mild mitral regurgitation.  He was admitted to the hospital in 11/2019 with a NSTEMI.  LHC at that time showed severe native vessel CAD including 80% distal left main, 100% proximal LAD and diffuse LCx/OM disease along with 90% mid RCA stenosis and CTO of the RPDA.  Patent LIMA to D3 with backfilling of the mid/distal LAD, patent SVG to D1-D2, SVG to OM1 OM two, and SVG to RPDA/RPL.  Mildly to moderately elevated LVEDP.  Medical management was recommended.  Echo during that admission showed an EF of 50 to 55%, mild LVH, moderate hypokinesis of the mid anteroseptal wall, grade 1 diastolic dysfunction, mildly reduced RV systolic function with normal RV cavity size, mildly dilated left atrium, mild mitral regurgitation, trivial tricuspid regurgitation, mild to moderate aortic valve sclerosis without stenosis, mild dilatation of the aortic root and ascending aorta.  During his admission he was  noted to have Mobitz type I AV block and a left bundle.  He was evaluated by EP with subsequent outpatient cardiac monitoring showing a predominant rhythm of sinus with an average rate of 62 bpm, four runs of NSVT with the longest lasting 15 beats with an average rate of 120 bpm and the fastest interval lasting four beats with a maximum rate of 222 bpm, three runs of SVT with the longest lasting six beats, second-degree AV block type I, isolated PACs, atrial couplets, and triplets, occasional PVCs, rare ventricular couplets and triplets, ventricular trigeminy and bigeminy were noted.  He was most recently seen in the office on 05/11/2020 and under an increased amount of personal and financial stress.  He reported an episode of chest pain similar to this prior episodes that occurred while getting into bed lasting minutes in duration.  Following this episode he noted increased dizziness for 2 days afterwards.  Orthostatic vital signs were positive at that visit.  He was referred to EP by Marrianne Mood, PA-C.  Ischemic evaluation was deferred.  He comes in doing reasonably well from a cardiac perspective.  Since he was last seen he has not had any further dizziness.  No chest pain, dyspnea, palpitations, presyncope, or syncope.  He does note fluctuations in his weight in which she will take his torsemide for 3 to 4 days in a row followed by 3 to 4 days without and again he will need this subsequently thereafter for approximately 3 to 4 days.  He would like to find  a more stable regimen.  He denies adding salt to food and drinks less than 2 L of fluid per day.  His weight is up 6 pounds today when compared to his last clinic visit.  He has stable two-pillow orthopnea.  Otherwise, he is without complaints.   Labs independently reviewed: 04/2020 - TSH normal, HGB 13.8, PLT 222, BUN 19, SCr 1.15, potassium 4.5 11/2019 - magnesium 1.5, TC 121, TG 98, HDL 34, LDL 67, A1c 8.9, albumin 3.4, AST/ALT normal  Past  Medical History:  Diagnosis Date  . CAD (coronary artery disease)    a. s/p CABG;  b. 07/2012 Cath: 3VD including 80 dLCX (med Rx), 4/4 patent grafts.  . Chronic diastolic CHF (congestive heart failure) ([place])    a. 07/2012 Echo: EF 55-60%, no rwma, Gr 1 DD, mild MR;  04/2014 Echo: EF 55-60%, no rwma, mild MR, mildly dil LA.  . Diabetes mellitus, type 2 ([place])   . HTN (hypertension)   . Hyperlipidemia   . Hypothyroidism   . Lower extremity edema    a. 03/2014 amlodipine d/c'd.  Marland Kitchen Spinal stenosis     Past Surgical History:  Procedure Laterality Date  . APPENDECTOMY    . CARDIAC CATHETERIZATION  07/2012   [facility]/no stents  . CHOLECYSTECTOMY    . CORNEAL TRANSPLANT    . CORONARY ARTERY BYPASS GRAFT  1994  . HEMORRHOID SURGERY    . LEFT HEART CATH AND CORS/GRAFTS ANGIOGRAPHY N/A 12/17/2019   Procedure: LEFT HEART CATH AND CORS/GRAFTS ANGIOGRAPHY;  Surgeon: Nelva Bush, MD;  Location: [facility];  Service: Cardiovascular;  Laterality: N/A;  . PROSTATE BIOPSY    . TOE SURGERY     ingrown toe nail    Current Medications: Current Meds  Medication Sig  . aspirin EC 81 MG tablet Take 1 tablet (81 mg total) by mouth daily.  Marland Kitchen atorvastatin (LIPITOR) 80 MG tablet Take 1 tablet (80 mg total) by mouth every evening.  . Cholecalciferol 125 MCG (5000 UT) capsule Take 5,000 Units by mouth at bedtime.  . clobetasol cream (TEMOVATE) 9.50 % Apply 1 application topically 2 (two) times daily. As needed to hands and elbows  . clopidogrel (PLAVIX) 75 MG tablet Take 1 tablet (75 mg total) by mouth daily.  . Continuous Blood Gluc Sensor (FREESTYLE LIBRE 14 DAY SENSOR) MISC USE AS DIRECTED EVERY 14 DAYS  . hydrocortisone 2.5 % cream Apply 1 application topically 2 (two) times daily. As needed to face, crease, groin and butt  . insulin aspart (NOVOLOG) 100 UNIT/ML injection Inject into the skin 3 (three) times daily before meals. Inject 20 units in the morning / 15 units at 12:00pm / and 20 units at  night time.  . insulin glargine (LANTUS) 100 UNIT/ML injection Inject 65 Units into the skin daily.  Marland Kitchen ketoconazole (NIZORAL) 2 % cream Apply 1 application topically 2 (two) times daily as needed for irritation. To groin, fave and buttocks.  Marland Kitchen levothyroxine (SYNTHROID) 50 MCG tablet Take 50 mcg by mouth daily before breakfast.  . losartan (COZAAR) 100 MG tablet Take 1 tablet (100 mg total) by mouth at bedtime.  . Magnesium 250 MG TABS Take 250 mg by mouth as needed (swelling). Take when you take your Torsemide.  . mometasone (ELOCON) 0.1 % ointment Apply topically daily. As needed to scalp and ears  . nitroGLYCERIN (NITROSTAT) 0.4 MG SL tablet Place 1 tablet (0.4 mg total) under the tongue every 5 (five) minutes as needed for chest pain.  Marland Kitchen  omeprazole (PRILOSEC) 20 MG capsule Take 1 capsule by mouth once daily (Patient taking differently: Take 20 mg by mouth at bedtime. )  . potassium chloride (KLOR-CON) 10 MEQ tablet Take 1 tablet (10 mEq total) by mouth daily.    Allergies:   Patient has no known allergies.   Social History   Socioeconomic History  . Marital status: Married    Spouse name: Not on file  . Number of children: 3  . Years of education: Not on file  . Highest education level: Some college, no degree  Occupational History  . Occupation: retired  Tobacco Use  . Smoking status: Former Smoker    Packs/day: 0.00    Years: 6.00    Pack years: 0.00    Types: Pipe, Cigars    Quit date: 12/23/1980    Years since quitting: 39.4  . Smokeless tobacco: Never Used  Vaping Use  . Vaping Use: Never used  Substance and Sexual Activity  . Alcohol use: No  . Drug use: No  . Sexual activity: Never  Other Topics Concern  . Not on file  Social History Narrative   Retired.    Social Determinants of Health   Financial Resource Strain: Low Risk   . Difficulty of Paying Living Expenses: Not hard at all  Food Insecurity: No Food Insecurity  . Worried About Charity fundraiser in the  Last Year: Never true  . Ran Out of Food in the Last Year: Never true  Transportation Needs: No Transportation Needs  . Lack of Transportation (Medical): No  . Lack of Transportation (Non-Medical): No  Physical Activity: Inactive  . Days of Exercise per Week: 0 days  . Minutes of Exercise per Session: 0 min  Stress: No Stress Concern Present  . Feeling of Stress : Not at all  Social Connections: Moderately Integrated  . Frequency of Communication with Friends and Family: More than three times a week  . Frequency of Social Gatherings with Friends and Family: Never  . Attends Religious Services: More than 4 times per year  . Active Member of Clubs or Organizations: No  . Attends Archivist Meetings: Never  . Marital Status: Married     Family History:  The patient's family history includes Heart attack in his father; Heart failure in his maternal grandfather, maternal uncle, and mother; Kidney Stones in his son; Sleep apnea in his daughter and son; Stroke in his brother.  ROS:   Review of Systems  Constitutional: Positive for malaise/fatigue. Negative for chills, diaphoresis, fever and weight loss.  HENT: Negative for congestion.   Eyes: Negative for discharge and redness.  Respiratory: Negative for cough, sputum production, shortness of breath and wheezing.   Cardiovascular: Positive for leg swelling. Negative for chest pain, palpitations, orthopnea, claudication and PND.  Gastrointestinal: Negative for abdominal pain, heartburn, nausea and vomiting.  Musculoskeletal: Negative for falls and myalgias.  Skin: Negative for rash.  Neurological: Negative for dizziness, tingling, tremors, sensory change, speech change, focal weakness, loss of consciousness and weakness.  Endo/Heme/Allergies: Does not bruise/bleed easily.  Psychiatric/Behavioral: Negative for substance abuse. The patient is not nervous/anxious.   All other systems reviewed and are negative.    EKGs/Labs/Other  Studies Reviewed:    Studies reviewed were summarized above. The additional studies were reviewed today:  Zio patch 01/2020: Normal sinus rhythm avg HR of 62 bpm.  4 Ventricular Tachycardia runs occurred, the run with the fastest interval lasting 4 beats with a max rate of  222 bpm, the longest lasting 15 beats with an avg rate of 120 bpm.   3 Supraventricular Tachycardia runs occurred, the run with the fastest interval lasting 5 beats with a max rate of 135 bpm, the longest lasting 6 beats with an avg rate of 100 bpm.   Second Degree AV Block-Mobitz I (Wenckebach) was present.   Isolated SVEs were rare (<1.0%), SVE Couplets were rare (<1.0%), and SVE Triplets were rare (<1.0%). Isolated VEs were occasional (3.5%, 39247), VE Couplets were rare (<1.0%, 1522), and VE Triplets were rare (<1.0%, 10). Ventricular Bigeminy and Trigeminy were present.  No patient triggered events recorded. __________  2D echo 11/2019: 1. Left ventricular ejection fraction, by visual estimation, is 50 to  55%. The left ventricle has low normal function. There is mildly increased  left ventricular hypertrophy.  2. Moderate hypokinesis of the left ventricular, mid anteroseptal wall.  3. Left ventricular diastolic parameters are consistent with Grade I  diastolic dysfunction (impaired relaxation).  4. The left ventricle demonstrates regional wall motion abnormalities.  5. Global right ventricle has mildly reduced systolic function.The right  ventricular size is normal. No increase in right ventricular wall  thickness.  6. Left atrial size was mildly dilated.  7. Right atrial size was normal.  8. The mitral valve is normal in structure. Mild mitral valve  regurgitation. No evidence of mitral stenosis.  9. The tricuspid valve is normal in structure.  10. The tricuspid valve is normal in structure. Tricuspid valve  regurgitation is trivial.  11. The aortic valve is tricuspid. Aortic valve  regurgitation is not  visualized. Mild to moderate aortic valve sclerosis/calcification without  any evidence of aortic stenosis.  12. The pulmonic valve was not well visualized. Pulmonic valve  regurgitation is not visualized.  13. Aortic dilatation noted.  14. There is mild dilatation of the aortic root and of the ascending aorta  measuring 38 mm.  15. TR signal is inadequate for assessing pulmonary artery systolic  pressure.  16. The inferior vena cava is normal in size with greater than 50%  respiratory variability, suggesting right atrial pressure of 3 mmHg.  17. The interatrial septum was not well visualized. __________  [facility] 11/2019:  The left ventricular ejection fraction is 35-45% by visual estimate.   Conclusions: 1. Severe native CAD, including 80% distal LMCA, 100% proximal LAD and diffuse LCx/OM disease. 90% mid RCA stenosis and CTO of rPDA also present. 2. Patent LIMA->D3 with backfilling of the mid/distal LADl. 3. Patent SVG-D1-D2, SVG-OM1-OM2, and SVG-rPDA-rPL. 4. Mildly to moderately elevated LVEDP. 5. At least moderately reduced left ventricular systolic function.  Recommendations: 1. Medical therapy; recommend up to 12 months of DAPT with ASA and clopidogrel, as tolerated. 2. Aggressive secondary prevention of coronary artery disease. 3. Obtain echocardiogram to better assess LVEF.Marland Kitchen 4. Monitor on telemetry. Consider EP consultation of AV block persists, particularly if the patient is symptoms.    EKG:  EKG is ordered today.  The EKG ordered today demonstrates NSR with sinus arrhythmia, 60 bpm, occasional PVCs, no acute ST-T changes, unchanged from prior  Recent Labs: 12/16/2019: ALT 21 12/18/2019: Magnesium 1.5 05/11/2020: BUN 19; Creatinine, Ser 1.15; Hemoglobin 13.8; Platelets 222; Potassium 4.5; Sodium 141; TSH 2.220  Recent Lipid Panel    Component Value Date/Time   CHOL 121 12/17/2019 0749   CHOL 122 05/14/2018 0824   TRIG 98 12/17/2019 0749   HDL  34 (L) 12/17/2019 0749   HDL 34 (L) 05/14/2018 0824   CHOLHDL 3.6 12/17/2019  0749   VLDL 20 12/17/2019 0749   LDLCALC 67 12/17/2019 0749   LDLCALC 55 05/14/2018 0824    PHYSICAL EXAM:    VS:  BP 138/78 (BP Location: Left Arm, Patient Position: Sitting, Cuff Size: Normal)   Pulse 60   Ht 5\' 7"  (1.702 m)   Wt 213 lb (96.6 kg)   BMI 33.36 kg/m   BMI: Body mass index is 33.36 kg/m.  Physical Exam Constitutional:      Appearance: He is well-developed.  HENT:     Head: Normocephalic and atraumatic.  Eyes:     General:        Right eye: No discharge.        Left eye: No discharge.  Neck:     Vascular: No JVD.  Cardiovascular:     Rate and Rhythm: Normal rate and regular rhythm.     Pulses: No midsystolic click and no opening snap.     Heart sounds: Normal heart sounds, S1 normal and S2 normal. Heart sounds not distant. No murmur heard.  No friction rub.  Pulmonary:     Effort: Pulmonary effort is normal. No respiratory distress.     Breath sounds: Normal breath sounds. No decreased breath sounds, wheezing or rales.  Chest:     Chest wall: No tenderness.  Abdominal:     General: There is no distension.     Palpations: Abdomen is soft.     Tenderness: There is no abdominal tenderness.  Musculoskeletal:     Cervical back: Normal range of motion.     Left lower leg: Edema present.     Comments: Trace to 1+ bilateral pretibial edema  Skin:    General: Skin is warm and dry.     Nails: There is no clubbing.  Neurological:     Mental Status: He is alert and oriented to person, place, and time.  Psychiatric:        Speech: Speech normal.        Behavior: Behavior normal.        Thought Content: Thought content normal.        Judgment: Judgment normal.     Wt Readings from Last 3 Encounters:  06/08/20 213 lb (96.6 kg)  05/11/20 207 lb 8 oz (94.1 kg)  02/10/20 219 lb (99.3 kg)     ASSESSMENT & PLAN:   1. Acute on chronic HFpEF: She does appear mildly volume  overloaded with a weight that is up 6 pounds when compared to his last clinic.  We will change his torsemide regimen in an effort to obtain a more stable weight/symptoms.  He will take torsemide 20 mg daily with KCl 10 mEq daily for the next 2 days.  Thereafter, he will take torsemide 20 mg daily and KCl 10 mEq on Monday, Wednesday, and Friday.  Leg elevation recommended.  Check BMP in 1 week.  2. CAD status post CABG: No symptoms concerning for angina.  Most recent cath in 11/2019 with medical management.  He remains on aspirin, Plavix, losartan, and Lipitor.  No plans for further ischemic evaluation at this time.  Not on a beta-blocker secondary to underlying bradycardic heart rates with Mobitz type I AV block.  3. Dizziness/Mobitz type I AV block/bradycardia/NSVT/SVT/PVCs/orthostatic hypotension: Symptoms significantly improved with no further dizziness.  No symptoms of palpitations.  Recommend adequate hydration.  Not on beta-blocker.  Previously referred to EP.  Disposition: F/u with Dr. Rockey Situ or an APP in 3 months.   Medication Adjustments/Labs  and Tests Ordered: Current medicines are reviewed at length with the patient today.  Concerns regarding medicines are outlined above. Medication changes, Labs and Tests ordered today are summarized above and listed in the Patient Instructions accessible in Encounters.   Signed, Christell Faith, PA-C 06/08/2020 11:48 AM     [facility] [address] [place] [address] [place], [place] [address] [phone]

## 2020-06-07 NOTE — Patient Instructions (Signed)
Thank you for allowing the Chronic Care Management team to participate in your care.  Goals Addressed            This Visit's Progress   . Chronic Disease Management       CARE PLAN ENTRY (see longitudinal plan of care for additional care plan information)  Current Barriers:  . Chronic Disease Management support and education needs Diabetes, Hypertension, Hyperlipidemia and CHF.  Case Manager Clinical Goal(s):  Over the next 120 days, patient will: . Not require hospitalization or emergent care d/t complications r/t chronic illnesses. . Attend all scheduled medical appointments . Take all medications a prescribed . Monitor blood glucose levels and maintain a log. Marland Kitchen Routinely monitor BP and record readings. . Adhere to recommended cardiac prudent/diabetic diet. . Follow recommended safety measures to prevent falls and injuries.   Interventions:  . Inter-disciplinary care team collaboration (see longitudinal plan of care) . Discussed blood glucose readings. Reports readings have been within range. Reports fasting reading of 81mg /dl today.  . Discussed worsening s/sx r/t CHF that require immediate medical attention. Reports edema to lower extremities but otherwise doing well. Taking torsemide and monitoring weight as recommended. No complaints of shortness of breath, chest pain or decline in activity tolerance. Reports morning weight of 204 lbs. . Reviewed pending/scheduled appointments. Pending evaluation by Electrophysiologist. Pending PCP outreach on 06/11/20.   Patient Self Care Activities:  . Self administers medications as prescribed . Attends scheduled provider appointments . Calls pharmacy for medication refills . Performs ADL's independently . Performs IADL's independently.   Please see past updates related to this goal by clicking on the "Past Updates" button in the selected goal         Mr. Joseph [name] verbalized understanding of the information discussed during the  telephonic outreach today. Declined need for a mailed/printed copy of the instructions.  The care management team will follow-up with Joseph [name] next month.   Joseph [name] [facility] [phone]

## 2020-06-08 ENCOUNTER — Other Ambulatory Visit: Payer: Self-pay

## 2020-06-08 ENCOUNTER — Ambulatory Visit: Payer: PPO | Admitting: Physician Assistant

## 2020-06-08 ENCOUNTER — Encounter: Payer: Self-pay | Admitting: Physician Assistant

## 2020-06-08 VITALS — BP 138/78 | HR 60 | Ht 67.0 in | Wt 213.0 lb

## 2020-06-08 DIAGNOSIS — I2510 Atherosclerotic heart disease of native coronary artery without angina pectoris: Secondary | ICD-10-CM | POA: Diagnosis not present

## 2020-06-08 DIAGNOSIS — R42 Dizziness and giddiness: Secondary | ICD-10-CM | POA: Diagnosis not present

## 2020-06-08 DIAGNOSIS — Z8679 Personal history of other diseases of the circulatory system: Secondary | ICD-10-CM | POA: Diagnosis not present

## 2020-06-08 DIAGNOSIS — I5033 Acute on chronic diastolic (congestive) heart failure: Secondary | ICD-10-CM

## 2020-06-08 DIAGNOSIS — Z87898 Personal history of other specified conditions: Secondary | ICD-10-CM

## 2020-06-08 DIAGNOSIS — Z79899 Other long term (current) drug therapy: Secondary | ICD-10-CM | POA: Diagnosis not present

## 2020-06-08 DIAGNOSIS — Z951 Presence of aortocoronary bypass graft: Secondary | ICD-10-CM | POA: Diagnosis not present

## 2020-06-08 MED ORDER — TORSEMIDE 20 MG PO TABS: ORAL_TABLET | ORAL | Status: DC

## 2020-06-08 MED ORDER — POTASSIUM CHLORIDE ER 10 MEQ PO TBCR: EXTENDED_RELEASE_TABLET | ORAL | Status: DC

## 2020-06-08 NOTE — Patient Instructions (Addendum)
Medication Instructions:  - Your physician has recommended you make the following change in your medication:   1) Start torsemide 20 mg - take 1 tablet (20 mg) once daily x 3 days, then - take 1 tablet (20 mg) once a day on Mondays, Wednesdays, and Fridays  2) Potassium 10 meq - take 1 tablet (10 meq) once daily x 3 days (with torsemide), then - take 1 tablet (10 meq) once a day on Mondays, Wednesdays, and Fridays (when taking torsemide)  *If you need a refill on your cardiac medications before your next appointment, please call your pharmacy*   Lab Work: - Your physician recommends that you return for lab work in: 1 week (around 06/15/20)- BMP - come to the [place] entrance at [facility] - Lab hours: Monday- Friday (7:30 am- 5:30 pm) - 1st desk on the right (Registration desk) to check in just past the screening table - you do not have to be fasting.   If you have labs (blood work) drawn today and your tests are completely normal, you will receive your results only by: Marland Kitchen MyChart Message (if you have MyChart) OR . A paper copy in the mail If you have any lab test that is abnormal or we need to change your treatment, we will call you to review the results.   Testing/Procedures: - none ordered   Follow-Up: At [facility], you and your health needs are our priority.  As part of our continuing mission to provide you with exceptional heart care, we have created designated Provider Care Teams.  These Care Teams include your primary Cardiologist (physician) and Advanced Practice Providers (APPs -  Physician Assistants and Nurse Practitioners) who all work together to provide you with the care you need, when you need it.  We recommend signing up for the patient portal called "MyChart".  Sign up information is provided on this After Visit Summary.  MyChart is used to connect with patients for Virtual Visits (Telemedicine).  Patients are able to view lab/test results, encounter notes,  upcoming appointments, etc.  Non-urgent messages can be sent to your provider as well.   To learn more about what you can do with MyChart, go to [email or web address].    Your next appointment:   As scheduled   The format for your next appointment:   In Person  Provider:   Virl Axe, MD   Other Instructions n/a

## 2020-06-09 NOTE — Progress Notes (Signed)
Established patient visit  I,April Miller,acting as a scribe for Wilhemena Durie, MD.,have documented all relevant documentation on the behalf of Wilhemena Durie, MD,as directed by  Wilhemena Durie, MD while in the presence of Wilhemena Durie, MD.   Patient: Joseph [name]   DOB: Oct 30, 1925   86 y.o. Male  MRN: [id number] Visit Date: 06/11/2020  Today's healthcare provider: Wilhemena Durie, MD   Chief Complaint  Patient presents with  . Follow-up  . Hyperlipidemia  . Hypertension   Subjective    HPI  Patient feels well.  He saw cardiologist 3 days ago was started on torsemide 20 mg every morning for 3 days and then every other day due to some mild volume overload.  He is feeling fine and having no chest pain shortness of breath orthopnea or PND. He has an actinic keratosis on his left cheek and a wart lesion on his left temple area.  Both are irritated. Hypertension, follow-up  BP Readings from Last 3 Encounters:  06/11/20 (!) 162/68  06/08/20 138/78  05/11/20 (!) 142/54   Wt Readings from Last 3 Encounters:  06/11/20 (!) 211 lb (95.7 kg)  06/08/20 213 lb (96.6 kg)  05/11/20 207 lb 8 oz (94.1 kg)     He was last seen for hypertension 4 months ago.  BP at that visit was 158/68. Management since that visit includes; on losartan. He reports good compliance with treatment. He is not having side effects. none He is not exercising. He is adherent to low salt diet.   Outside blood pressures are normal.  He does not smoke.  Use of agents associated with hypertension: none.   --------------------------------------------------------------------  Lipid/Cholesterol, follow-up  Last Lipid Panel: Lab Results  Component Value Date   CHOL 121 12/17/2019   LDLCALC 67 12/17/2019   HDL 34 (L) 12/17/2019   TRIG 98 12/17/2019    He was last seen for this 4 months ago.  Management since that visit includes; on atorvastatin. Will check labs at next  visit. He reports good compliance with treatment. He is not having side effects. none He is following a Regular diet. Current exercise: none  Last metabolic panel Lab Results  Component Value Date   GLUCOSE 109 (H) 05/11/2020   NA 141 05/11/2020   K 4.5 05/11/2020   BUN 19 05/11/2020   CREATININE 1.15 05/11/2020   GFRNONAA 55 (L) 05/11/2020   GFRAA 64 05/11/2020   CALCIUM 9.2 05/11/2020   AST 24 12/16/2019   ALT 21 12/16/2019   The ASCVD Risk score Mikey Bussing DC Jr., et al., 2013) failed to calculate for the following reasons:   The 2013 ASCVD risk score is only valid for ages 109 to 26   The patient has a prior MI or stroke diagnosis  --------------------------------------------------------------------  Type 2 diabetes mellitus with diabetic peripheral angiopathy without gangrene, with long-term current use of insulin ([place]) From 02/10/2020-Followed by endocrinology. Last Hemoglobin A1c checked by endo was 9.8 on 04/29/2020.  Patient Active Problem List   Diagnosis Date Noted  . AV block, [place] 1   . NSTEMI (non-ST elevated myocardial infarction) ([place])   . ACS (acute coronary syndrome) ([place]) 12/16/2019  . Acute cholecystitis   . Calculus of gallbladder with acute cholecystitis without obstruction   . Diverticulitis of large intestine without perforation or abscess without bleeding   . Pain of upper abdomen   . SOB (shortness of breath)   . S/P CABG (coronary artery bypass  graft)   . Morbid obesity due to excess calories ([place])   . Acute respiratory failure with hypoxia ([place]) 03/21/2016  . Back pain, chronic 07/28/2015  . Benign fibroma of prostate 07/28/2015  . Acid reflux 07/28/2015  . Adult hypothyroidism 07/28/2015  . Eunuchoidism 07/28/2015  . Neuropathy 07/28/2015  . Arthritis, degenerative 07/28/2015  . Psoriasis 07/28/2015  . Lumbar spondylosis 05/08/2015  . Obesity 09/09/2014  . Bilateral leg weakness 09/09/2014  . Acute on chronic diastolic heart failure ([place])  05/13/2014  . Bilateral leg edema 04/15/2014  . Bradycardia 08/13/2013  . Edema 06/21/2012  . Fatigue 09/15/2011  . Type 2 diabetes mellitus with diabetic peripheral angiopathy without gangrene, with long-term current use of insulin ([place]) 09/15/2011  . DYSPNEA 06/09/2009  . Hyperlipidemia 06/07/2009  . Essential hypertension 06/07/2009  . CAD, ARTERY BYPASS GRAFT 06/07/2009       Medications: Outpatient Medications Prior to Visit  Medication Sig  . aspirin EC 81 MG tablet Take 1 tablet (81 mg total) by mouth daily.  Marland Kitchen atorvastatin (LIPITOR) 80 MG tablet Take 1 tablet (80 mg total) by mouth every evening.  . Cholecalciferol 125 MCG (5000 UT) capsule Take 5,000 Units by mouth at bedtime.  . clobetasol cream (TEMOVATE) 0.93 % Apply 1 application topically 2 (two) times daily. As needed to hands and elbows  . clopidogrel (PLAVIX) 75 MG tablet Take 1 tablet (75 mg total) by mouth daily.  . Continuous Blood Gluc Sensor (FREESTYLE LIBRE 14 DAY SENSOR) MISC USE AS DIRECTED EVERY 14 DAYS  . hydrocortisone 2.5 % cream Apply 1 application topically 2 (two) times daily. As needed to face, crease, groin and butt  . insulin aspart (NOVOLOG) 100 UNIT/ML injection Inject into the skin 3 (three) times daily before meals. Inject 20 units in the morning / 15 units at 12:00pm / and 20 units at night time.  . insulin glargine (LANTUS) 100 UNIT/ML injection Inject 65 Units into the skin daily.  Marland Kitchen ketoconazole (NIZORAL) 2 % cream Apply 1 application topically 2 (two) times daily as needed for irritation. To groin, fave and buttocks.  Marland Kitchen levothyroxine (SYNTHROID) 50 MCG tablet Take 50 mcg by mouth daily before breakfast.  . losartan (COZAAR) 100 MG tablet Take 1 tablet (100 mg total) by mouth at bedtime.  . Magnesium 250 MG TABS Take 250 mg by mouth as needed (swelling). Take when you take your Torsemide.  . mometasone (ELOCON) 0.1 % ointment Apply topically daily. As needed to scalp and ears  . nitroGLYCERIN  (NITROSTAT) 0.4 MG SL tablet Place 1 tablet (0.4 mg total) under the tongue every 5 (five) minutes as needed for chest pain.  Marland Kitchen omeprazole (PRILOSEC) 20 MG capsule Take 1 capsule by mouth once daily (Patient taking differently: Take 20 mg by mouth at bedtime. )  . potassium chloride (KLOR-CON) 10 MEQ tablet Take 1 tablet (10 meq) by mouth once a day on Mondays, Wednesdays, & Fridays (when taking torsemide)  . torsemide (DEMADEX) 20 MG tablet Take 1 tablet (20 mg) by mouth once a day on Mondays, Wednesdays, and Fridays   No facility-administered medications prior to visit.    Review of Systems  Constitutional: Negative for appetite change, chills and fever.  Eyes: Negative.   Respiratory: Negative for chest tightness, shortness of breath and wheezing.   Cardiovascular: Negative for chest pain and palpitations.  Gastrointestinal: Negative for abdominal pain, nausea and vomiting.  Endocrine: Negative.   Allergic/Immunologic: Negative.   Neurological: Negative.   Hematological: Negative.  Psychiatric/Behavioral: Negative.     Last metabolic panel Lab Results  Component Value Date   GLUCOSE 257 (H) 06/15/2020   NA 140 06/15/2020   K 4.4 06/15/2020   CL 106 06/15/2020   CO2 27 06/15/2020   BUN 24 (H) 06/15/2020   CREATININE 1.15 06/15/2020   GFRNONAA 55 (L) 06/15/2020   GFRAA >60 06/15/2020   CALCIUM 8.4 (L) 06/15/2020   PROT 6.8 12/16/2019   ALBUMIN 3.4 (L) 12/16/2019   LABGLOB 2.7 05/14/2018   AGRATIO 1.2 05/14/2018   BILITOT 0.6 12/16/2019   ALKPHOS 47 12/16/2019   AST 24 12/16/2019   ALT 21 12/16/2019   ANIONGAP 7 06/15/2020      Objective    BP (!) 162/68 (BP Location: Left Arm, Patient Position: Sitting, Cuff Size: Large)   Pulse 68   Temp (!) 96.8 F (36 C) (Other (Comment))   Resp 18   Ht 5\' 7"  (1.702 m)   Wt (!) 211 lb (95.7 kg)   SpO2 96%   BMI 33.05 kg/m  BP Readings from Last 3 Encounters:  06/11/20 (!) 162/68  06/08/20 138/78  05/11/20 (!) 142/54    Wt Readings from Last 3 Encounters:  06/11/20 (!) 211 lb (95.7 kg)  06/08/20 213 lb (96.6 kg)  05/11/20 207 lb 8 oz (94.1 kg)      Physical Exam Vitals and nursing note reviewed.     BP (!) 162/68 (BP Location: Left Arm, Patient Position: Sitting, Cuff Size: Large)   Pulse 68   Temp (!) 96.8 F (36 C) (Other (Comment))   Resp 18   Ht 5\' 7"  (1.702 m)   Wt (!) 211 lb (95.7 kg)   SpO2 96%   BMI 33.05 kg/m   General Appearance:    Alert, cooperative, no distress, appears stated age  Head:    Normocephalic, without obvious abnormality, atraumatic  Eyes:    PERRL, conjunctiva/corneas clear, EOM's intact, fundi    benign, both eyes       Ears:    Normal TM's and external ear canals, both ears  Nose:   Nares normal, septum midline, mucosa normal, no drainage   or sinus tenderness  Throat:   Lips, mucosa, and tongue normal; teeth and gums normal  Neck:   Supple, symmetrical, trachea midline, no adenopathy;       thyroid:  No enlargement/tenderness/nodules; no carotid   bruit or JVD  Back:     Symmetric, no curvature, ROM normal, no CVA tenderness  Lungs:     Clear to auscultation bilaterally, respirations unlabored  Chest wall:    No tenderness or deformity  Heart:    Regular rate and rhythm, S1 and S2 normal, no murmur, rub   or gallop  Abdomen:     Soft, non-tender, bowel sounds active all four quadrants,    no masses, no organomegaly  Genitalia:    Normal male without lesion, discharge or tenderness  Rectal:    Normal tone, normal prostate, no masses or tenderness;   guaiac negative stool  Extremities:   Extremities normal, atraumatic, no cyanosis or edema  Pulses:   2+ and symmetric all extremities  Skin:   Skin color, texture, turgor normal, no rashes or lesions  Lymph nodes:   Cervical, supraclavicular, and axillary nodes normal  Neurologic:   CNII-XII intact. Normal strength, sensation and reflexes      throughout     Results for orders placed or performed in  visit on 06/11/20  Hemoglobin A1c  Result Value Ref Range   Hemoglobin A1C 9.8     Assessment & Plan     1. Essential hypertension Fairly good control but would not change any medications in this 86 year old.  2. Type 2 diabetes mellitus with diabetic peripheral angiopathy without gangrene, with long-term current use of insulin (HCC) A1c was 9.34-month ago.  3. Atherosclerosis of coronary artery bypass graft of native heart with stable angina pectoris ([place]) All risk factors treated.  4. Acute on chronic diastolic heart failure ([place]) Patient now on every other day torsemide.  He is to follow home weights.  5. Bilateral leg edema Clinically improved  6. Class 1 obesity due to excess calories with serious comorbidity and body mass index (BMI) of 32.0 to 32.9 in adult With heart disease diabetes hypertension hyperlipidemia  7. Actinic keratosis of left cheek Frozen with cryo pen  8. Wart of scalp Frozen with cryo pen  Return in 4 months (on 10/12/2020).         Jannah Guardiola Cranford Mon, MD  [facility] [phone] (phone) [phone] (fax)  [place]

## 2020-06-11 ENCOUNTER — Ambulatory Visit: Payer: Self-pay | Admitting: Family Medicine

## 2020-06-11 ENCOUNTER — Other Ambulatory Visit: Payer: Self-pay

## 2020-06-11 ENCOUNTER — Ambulatory Visit (INDEPENDENT_AMBULATORY_CARE_PROVIDER_SITE_OTHER): Payer: PPO | Admitting: Family Medicine

## 2020-06-11 ENCOUNTER — Encounter: Payer: Self-pay | Admitting: Family Medicine

## 2020-06-11 VITALS — BP 162/68 | HR 68 | Temp 96.8°F | Resp 18 | Ht 67.0 in | Wt 211.0 lb

## 2020-06-11 DIAGNOSIS — B079 Viral wart, unspecified: Secondary | ICD-10-CM | POA: Diagnosis not present

## 2020-06-11 DIAGNOSIS — E6609 Other obesity due to excess calories: Secondary | ICD-10-CM

## 2020-06-11 DIAGNOSIS — Z794 Long term (current) use of insulin: Secondary | ICD-10-CM

## 2020-06-11 DIAGNOSIS — L570 Actinic keratosis: Secondary | ICD-10-CM | POA: Diagnosis not present

## 2020-06-11 DIAGNOSIS — Z6832 Body mass index (BMI) 32.0-32.9, adult: Secondary | ICD-10-CM

## 2020-06-11 DIAGNOSIS — E1151 Type 2 diabetes mellitus with diabetic peripheral angiopathy without gangrene: Secondary | ICD-10-CM

## 2020-06-11 DIAGNOSIS — I10 Essential (primary) hypertension: Secondary | ICD-10-CM | POA: Diagnosis not present

## 2020-06-11 DIAGNOSIS — I25708 Atherosclerosis of coronary artery bypass graft(s), unspecified, with other forms of angina pectoris: Secondary | ICD-10-CM

## 2020-06-11 DIAGNOSIS — R600 Localized edema: Secondary | ICD-10-CM | POA: Diagnosis not present

## 2020-06-11 DIAGNOSIS — I5033 Acute on chronic diastolic (congestive) heart failure: Secondary | ICD-10-CM | POA: Diagnosis not present

## 2020-06-14 ENCOUNTER — Other Ambulatory Visit: Payer: Self-pay | Admitting: Family Medicine

## 2020-06-14 NOTE — Telephone Encounter (Signed)
Requested medication (s) are due for refill today: -  Requested medication (s) are on the active medication list: historical med  Last refill:  06/08/20  Future visit scheduled: no  Notes to clinic:  historical med and provider   Requested Prescriptions  Pending Prescriptions Disp Refills   levothyroxine (SYNTHROID) 50 MCG tablet [Pharmacy Med Name: Levothyroxine Sodium 50 MCG Oral Tablet] 90 tablet 0    Sig: TAKE 1 TABLET BY MOUTH ONCE DAILY BEFORE BREAKFAST      Endocrinology:  Hypothyroid Agents Failed - 06/14/2020  2:20 PM      Failed - TSH needs to be rechecked within 3 months after an abnormal result. Refill until TSH is due.      Passed - TSH in normal range and within 360 days    TSH  Date Value Ref Range Status  05/11/2020 2.220 0.450 - 4.500 uIU/mL Final          Passed - Valid encounter within last 12 months    Recent Outpatient Visits           3 days ago Essential hypertension   [facility] [name]., MD   4 months ago Flank pain   [facility] [name]., MD   5 months ago Atherosclerosis of coronary artery bypass graft of native heart with stable angina pectoris [facility])   [facility] [name]., MD   7 months ago Pain of left deltoid   [facility] Virginia Crews, MD   7 months ago Left arm pain   [facility] [name]., MD       Future Appointments             In 3 weeks Deboraha Sprang, MD [facility], [facility]   In 4 months [name]., MD [facility], [facility]

## 2020-06-15 ENCOUNTER — Telehealth: Payer: Self-pay

## 2020-06-15 ENCOUNTER — Other Ambulatory Visit
Admission: RE | Admit: 2020-06-15 | Discharge: 2020-06-15 | Disposition: A | Discharge status: Home / Self Care | Payer: PPO | Attending: Physician Assistant | Admitting: Physician Assistant

## 2020-06-15 DIAGNOSIS — I5033 Acute on chronic diastolic (congestive) heart failure: Secondary | ICD-10-CM

## 2020-06-15 DIAGNOSIS — Z79899 Other long term (current) drug therapy: Secondary | ICD-10-CM | POA: Insufficient documentation

## 2020-06-15 LAB — BASIC METABOLIC PANEL
Anion gap: 7 (ref 5–15)
BUN: 24 mg/dL — ABNORMAL HIGH (ref 8–23)
CO2: 27 mmol/L (ref 22–32)
Calcium: 8.4 mg/dL — ABNORMAL LOW (ref 8.9–10.3)
Chloride: 106 mmol/L (ref 98–111)
Creatinine, Ser: 1.15 mg/dL (ref 0.61–1.24)
GFR calc Af Amer: >60 mL/min (ref >60)
GFR calc non Af Amer: 55 mL/min — ABNORMAL LOW (ref >60)
Glucose, Bld: 257 mg/dL — ABNORMAL HIGH (ref 70–99)
Potassium: 4.4 mmol/L (ref 3.5–5.1)
Sodium: 140 mmol/L (ref 135–145)

## 2020-06-15 NOTE — Telephone Encounter (Signed)
-----   Message from Rise Mu, PA-C sent at 06/15/2020 11:11 AM EDT ----- Renal function stable on torsemide.  I would like to check a follow-up BMET again in 1 week to ensure he is tolerating scheduled torsemide on Monday, Wednesday and Friday.

## 2020-06-15 NOTE — Telephone Encounter (Signed)
Call to patient to review labs.    Pt verbalized understanding and has no further questions at this time.    Advised pt to call for any further questions or concerns.  Order [facility] for repeat labs [facility].

## 2020-06-22 ENCOUNTER — Other Ambulatory Visit
Admission: RE | Admit: 2020-06-22 | Discharge: 2020-06-22 | Disposition: A | Discharge status: Home / Self Care | Payer: PPO | Attending: Physician Assistant | Admitting: Physician Assistant

## 2020-06-22 DIAGNOSIS — R809 Proteinuria, unspecified: Secondary | ICD-10-CM | POA: Diagnosis not present

## 2020-06-22 DIAGNOSIS — E1159 Type 2 diabetes mellitus with other circulatory complications: Secondary | ICD-10-CM | POA: Diagnosis not present

## 2020-06-22 DIAGNOSIS — E1129 Type 2 diabetes mellitus with other diabetic kidney complication: Secondary | ICD-10-CM | POA: Diagnosis not present

## 2020-06-22 DIAGNOSIS — E1165 Type 2 diabetes mellitus with hyperglycemia: Secondary | ICD-10-CM | POA: Diagnosis not present

## 2020-06-22 DIAGNOSIS — I5033 Acute on chronic diastolic (congestive) heart failure: Secondary | ICD-10-CM | POA: Diagnosis not present

## 2020-06-22 DIAGNOSIS — E1142 Type 2 diabetes mellitus with diabetic polyneuropathy: Secondary | ICD-10-CM | POA: Diagnosis not present

## 2020-06-22 DIAGNOSIS — Z794 Long term (current) use of insulin: Secondary | ICD-10-CM | POA: Diagnosis not present

## 2020-06-22 LAB — BASIC METABOLIC PANEL
Anion gap: 5 (ref 5–15)
BUN: 16 mg/dL (ref 8–23)
CO2: 29 mmol/L (ref 22–32)
Calcium: 8.9 mg/dL (ref 8.9–10.3)
Chloride: 106 mmol/L (ref 98–111)
Creatinine, Ser: 1.16 mg/dL (ref 0.61–1.24)
GFR calc Af Amer: >60 mL/min (ref >60)
GFR calc non Af Amer: 54 mL/min — ABNORMAL LOW (ref >60)
Glucose, Bld: 215 mg/dL — ABNORMAL HIGH (ref 70–99)
Potassium: 4.4 mmol/L (ref 3.5–5.1)
Sodium: 140 mmol/L (ref 135–145)

## 2020-07-09 ENCOUNTER — Ambulatory Visit: Payer: PPO | Admitting: Internal Medicine

## 2020-07-09 ENCOUNTER — Encounter: Payer: Self-pay | Admitting: Internal Medicine

## 2020-07-09 ENCOUNTER — Other Ambulatory Visit: Payer: Self-pay

## 2020-07-09 VITALS — BP 150/70 | HR 64 | Ht 67.0 in | Wt 207.0 lb

## 2020-07-09 DIAGNOSIS — I951 Orthostatic hypotension: Secondary | ICD-10-CM

## 2020-07-09 DIAGNOSIS — R42 Dizziness and giddiness: Secondary | ICD-10-CM | POA: Diagnosis not present

## 2020-07-09 NOTE — Progress Notes (Signed)
ELECTROPHYSIOLOGY CONSULT NOTE  Patient ID: Joseph [name], MRN: [id number], DOB/AGE: 1925/10/23 86 y.o. Admit date: (Not on file) Date of Consult: 07/09/2020  Primary Physician: [name]., MD Primary Cardiologist: MARCOS RUELAS is a 86 y.o. male who is being seen today for the evaluation of dizziness with orthostasis at the request of TG.    [name] is a 86 y.o. male referred for reasons that he is not quite sure.  Review of the chart notes that he was seen 6/21 following an episode of dizziness and was found to be profoundly orthostatic.  He denies orthostatic lightheadedness but he does have significant weakness in his legs with walking short distances.  Does have some dizziness in the shower.  History of coronary artery disease with bypass surgery 1994; denies chest pain.  Recurrent peripheral edema.  He takes his torsemide on an as-needed basis  Seen 6/21 with an episode of dizziness.  Found to be orthostatic blood pressure 181--124 with standing with an increase in heart rate of 51--102 DATE TEST EF   1/21 LHC  LIMA-D3, SVG-D1-D2; SVG-OM1-OM2 SVG-PDA-PL  ALL PATENT   1/21 Echo   50-55 %         Date Cr K Hgb  8/21 1.16 4.4 13.8         Event Recorder personnally reviewed  VT-NS, PVC 3.5%,    Past Medical History:  Diagnosis Date  . CAD (coronary artery disease)    a. s/p CABG;  b. 07/2012 Cath: 3VD including 80 dLCX (med Rx), 4/4 patent grafts.  . Chronic diastolic CHF (congestive heart failure) ([place])    a. 07/2012 Echo: EF 55-60%, no rwma, Gr 1 DD, mild MR;  04/2014 Echo: EF 55-60%, no rwma, mild MR, mildly dil LA.  . Diabetes mellitus, type 2 ([place])   . HTN (hypertension)   . Hyperlipidemia   . Hypothyroidism   . Lower extremity edema    a. 03/2014 amlodipine d/c'd.  Marland Kitchen Spinal stenosis       Surgical History:  Past Surgical History:  Procedure Laterality Date  . APPENDECTOMY    . CARDIAC CATHETERIZATION  07/2012     [facility]/no stents  . CHOLECYSTECTOMY    . CORNEAL TRANSPLANT    . CORONARY ARTERY BYPASS GRAFT  1994  . HEMORRHOID SURGERY    . LEFT HEART CATH AND CORS/GRAFTS ANGIOGRAPHY N/A 12/17/2019   Procedure: LEFT HEART CATH AND CORS/GRAFTS ANGIOGRAPHY;  Surgeon: Nelva Bush, MD;  Location: [facility];  Service: Cardiovascular;  Laterality: N/A;  . PROSTATE BIOPSY    . TOE SURGERY     ingrown toe nail     Home Meds: Current Meds  Medication Sig  . aspirin EC 81 MG tablet Take 1 tablet (81 mg total) by mouth daily.  Marland Kitchen atorvastatin (LIPITOR) 80 MG tablet Take 1 tablet (80 mg total) by mouth every evening.  . Cholecalciferol 125 MCG (5000 UT) capsule Take 5,000 Units by mouth at bedtime.  . clobetasol cream (TEMOVATE) 9.50 % Apply 1 application topically 2 (two) times daily. As needed to hands and elbows  . clopidogrel (PLAVIX) 75 MG tablet Take 1 tablet (75 mg total) by mouth daily.  . Continuous Blood Gluc Sensor (FREESTYLE LIBRE 14 DAY SENSOR) MISC USE AS DIRECTED EVERY 14 DAYS  . hydrocortisone 2.5 % cream Apply 1 application topically 2 (two) times daily. As needed to face, crease, groin and butt  .  insulin aspart (NOVOLOG) 100 UNIT/ML injection Inject into the skin 3 (three) times daily before meals. Inject 20 units in the morning / 15 units at 12:00pm / and 20 units at night time.  . insulin glargine (LANTUS) 100 UNIT/ML injection Inject 65 Units into the skin daily.  Marland Kitchen ketoconazole (NIZORAL) 2 % cream Apply 1 application topically 2 (two) times daily as needed for irritation. To groin, fave and buttocks.  Marland Kitchen levothyroxine (SYNTHROID) 50 MCG tablet TAKE 1 TABLET BY MOUTH ONCE DAILY BEFORE BREAKFAST  . losartan (COZAAR) 100 MG tablet Take 1 tablet (100 mg total) by mouth at bedtime.  . Magnesium 250 MG TABS Take 250 mg by mouth as needed (swelling). Take when you take your Torsemide.  . mometasone (ELOCON) 0.1 % ointment Apply topically daily. As needed to scalp and ears  .  nitroGLYCERIN (NITROSTAT) 0.4 MG SL tablet Place 1 tablet (0.4 mg total) under the tongue every 5 (five) minutes as needed for chest pain.  Marland Kitchen omeprazole (PRILOSEC) 20 MG capsule Take 1 capsule by mouth once daily (Patient taking differently: Take 20 mg by mouth at bedtime. )  . potassium chloride (KLOR-CON) 10 MEQ tablet Take 1 tablet (10 meq) by mouth once a day on Mondays, Wednesdays, & Fridays (when taking torsemide)  . torsemide (DEMADEX) 20 MG tablet Take 1 tablet (20 mg) by mouth once a day on Mondays, Wednesdays, and Fridays    Allergies: No Known Allergies  Social History   Socioeconomic History  . Marital status: Married    Spouse name: Not on file  . Number of children: 3  . Years of education: Not on file  . Highest education level: Some college, no degree  Occupational History  . Occupation: retired  Tobacco Use  . Smoking status: Former Smoker    Packs/day: 0.00    Years: 6.00    Pack years: 0.00    Types: Pipe, Cigars    Quit date: 12/23/1980    Years since quitting: 39.5  . Smokeless tobacco: Never Used  Vaping Use  . Vaping Use: Never used  Substance and Sexual Activity  . Alcohol use: No  . Drug use: No  . Sexual activity: Never  Other Topics Concern  . Not on file  Social History Narrative   Retired.    Social Determinants of Health   Financial Resource Strain: Low Risk   . Difficulty of Paying Living Expenses: Not hard at all  Food Insecurity: No Food Insecurity  . Worried About Charity fundraiser in the Last Year: Never true  . Ran Out of Food in the Last Year: Never true  Transportation Needs: No Transportation Needs  . Lack of Transportation (Medical): No  . Lack of Transportation (Non-Medical): No  Physical Activity: Inactive  . Days of Exercise per Week: 0 days  . Minutes of Exercise per Session: 0 min  Stress: No Stress Concern Present  . Feeling of Stress : Not at all  Social Connections: Moderately Integrated  . Frequency of  Communication with Friends and Family: More than three times a week  . Frequency of Social Gatherings with Friends and Family: Never  . Attends Religious Services: More than 4 times per year  . Active Member of Clubs or Organizations: No  . Attends Archivist Meetings: Never  . Marital Status: Married  Human resources officer Violence: Not At Risk  . Fear of Current or Ex-Partner: No  . Emotionally Abused: No  . Physically Abused: No  .  Sexually Abused: No     Family History  Problem Relation Age of Onset  . Heart failure Mother   . Heart attack Father   . Stroke Brother   . Heart failure Maternal Uncle   . Heart failure Maternal Grandfather   . Sleep apnea Daughter   . Sleep apnea Son   . Kidney Stones Son      ROS:  Please see the history of present illness.     All other systems reviewed and negative.    Physical Exam: Blood pressure (!) 150/70, pulse 64, height 5\' 7"  (1.702 m), weight 207 lb (93.9 kg), SpO2 96 %. General: Well developed, well nourished male in no acute distress. Head: Normocephalic, atraumatic, sclera non-icteric, no xanthomas, nares are without discharge. EENT: normal  Lymph Nodes:  none Neck: Negative for carotid bruits. JVD not elevated. Back mild  kyphosis  Lungs: Clear bilaterally to auscultation without wheezes, rales, or rhonchi. Breathing is unlabored. Heart: RRR with S1 S2.  2/6 systolic murmur . No rubs, or gallops appreciated. Abdomen: Soft, non-tender, non-distended with normoactive bowel sounds. No hepatomegaly. No rebound/guarding. No obvious abdominal masses. Msk:  Strength and tone appear normal for age. Extremities: No clubbing or cyanosis. 1+edema.  Distal pedal pulses are 2+ and equal bilaterally. Skin: Warm and Dry Neuro: Alert and oriented X 3. CN III-XII intact Grossly normal sensory and motor function . Psych:  Responds to questions appropriately with a normal affect.      Labs: Cardiac Enzymes No results for input(s):  CKTOTAL, CKMB, TROPONINI in the last 72 hours. CBC Lab Results  Component Value Date   WBC 5.6 05/11/2020   HGB 13.8 05/11/2020   HCT 40.6 05/11/2020   MCV 89 05/11/2020   PLT 222 05/11/2020   PROTIME: No results for input(s): LABPROT, INR in the last 72 hours. Chemistry No results for input(s): NA, K, CL, CO2, BUN, CREATININE, CALCIUM, PROT, BILITOT, ALKPHOS, ALT, AST, GLUCOSE in the last 168 hours.  Invalid input(s): LABALBU Lipids Lab Results  Component Value Date   CHOL 121 12/17/2019   HDL 34 (L) 12/17/2019   LDLCALC 67 12/17/2019   TRIG 98 12/17/2019   BNP No results found for: PROBNP Thyroid Function Tests: No results for input(s): TSH, T4TOTAL, T3FREE, THYROIDAB in the last 72 hours.  Invalid input(s): FREET3 Miscellaneous No results found for: DDIMER  Radiology/Studies:  No results found.  EKG: Sinus at 64 Interval 17/12/43 Axis left -46 Frequent variability in heart rate ranging from 50s--70s   Assessment and Plan:  Orthostatic hypotension  Ischemic heart disease with prior bypass surgery normal LV function  Bradycardia-relative   Orthostatic vital signs are much less impressive now than they were a couple months ago.  He has episodic dizziness that lasts for a day or so.  Not sure the mechanism.  Of asked him to record his blood pressure and his heart rate with those episodes.  He takes his diuretic on an as-needed basis and seems to be chasing.  He is willing to try it on a more predictable basis i.e. Monday Wednesday Friday as have been suggested previously.  Without symptoms of ischemia presume that he is on aspirin and Plavix for 1 year following his non-STEMI 1/21.      Virl Axe

## 2020-07-09 NOTE — Patient Instructions (Signed)
Medication Instructions:  - Your physician recommends that you continue on your current medications as directed. Please refer to the Current Medication list given to you today.  *If you need a refill on your cardiac medications before your next appointment, please call your pharmacy*   Lab Work: - none ordered  If you have labs (blood work) drawn today and your tests are completely normal, you will receive your results only by: . MyChart Message (if you have MyChart) OR . A paper copy in the mail If you have any lab test that is abnormal or we need to change your treatment, we will call you to review the results.   Testing/Procedures: - none ordered   Follow-Up: At [facility], you and your health needs are our priority.  As part of our continuing mission to provide you with exceptional heart care, we have created designated Provider Care Teams.  These Care Teams include your primary Cardiologist (physician) and Advanced Practice Providers (APPs -  Physician Assistants and Nurse Practitioners) who all work together to provide you with the care you need, when you need it.  We recommend signing up for the patient portal called "MyChart".  Sign up information is provided on this After Visit Summary.  MyChart is used to connect with patients for Virtual Visits (Telemedicine).  Patients are able to view lab/test results, encounter notes, upcoming appointments, etc.  Non-urgent messages can be sent to your provider as well.   To learn more about what you can do with MyChart, go to [email or web address].    Your next appointment:   As needed   The format for your next appointment:   In Person  Provider:   Steven Klein, MD   Other Instructions n/a  

## 2020-07-14 ENCOUNTER — Ambulatory Visit (INDEPENDENT_AMBULATORY_CARE_PROVIDER_SITE_OTHER): Payer: PPO

## 2020-07-14 DIAGNOSIS — I10 Essential (primary) hypertension: Secondary | ICD-10-CM | POA: Diagnosis not present

## 2020-07-14 DIAGNOSIS — I5033 Acute on chronic diastolic (congestive) heart failure: Secondary | ICD-10-CM | POA: Diagnosis not present

## 2020-07-14 DIAGNOSIS — Z794 Long term (current) use of insulin: Secondary | ICD-10-CM

## 2020-07-14 DIAGNOSIS — E1151 Type 2 diabetes mellitus with diabetic peripheral angiopathy without gangrene: Secondary | ICD-10-CM

## 2020-07-14 NOTE — Chronic Care Management (AMB) (Signed)
Chronic Care Management   Follow Up Note   07/14/2020 Name: JAIVIAN BATTAGLINI MRN: [id number] DOB: [birth year]  Primary Care Provider: [name]., MD Reason for referral : Chronic Care Management   DESEAN HEEMSTRA is a 86 y.o. year old male who is a primary care patient of [name]., MD. He is currently engaged with the chronic care management team. A routine outreach was conducted today.  Review of [name] status, including review of consultants reports, relevant labs and test results was conducted today. Collaboration with appropriate care team members was performed as part of the comprehensive evaluation and provision of chronic care management services.    SDOH (Social Determinants of Health) assessments performed: No See Care Plan activities for detailed interventions related to [facility])     Outpatient Encounter Medications as of 07/14/2020  Medication Sig  . aspirin EC 81 MG tablet Take 1 tablet (81 mg total) by mouth daily.  Marland Kitchen atorvastatin (LIPITOR) 80 MG tablet Take 1 tablet (80 mg total) by mouth every evening.  . Cholecalciferol 125 MCG (5000 UT) capsule Take 5,000 Units by mouth at bedtime.  . clobetasol cream (TEMOVATE) 7.51 % Apply 1 application topically 2 (two) times daily. As needed to hands and elbows  . clopidogrel (PLAVIX) 75 MG tablet Take 1 tablet (75 mg total) by mouth daily.  . Continuous Blood Gluc Sensor (FREESTYLE LIBRE 14 DAY SENSOR) MISC USE AS DIRECTED EVERY 14 DAYS  . hydrocortisone 2.5 % cream Apply 1 application topically 2 (two) times daily. As needed to face, crease, groin and butt  . insulin aspart (NOVOLOG) 100 UNIT/ML injection Inject into the skin 3 (three) times daily before meals. Inject 20 units in the morning / 15 units at 12:00pm / and 20 units at night time.  . insulin glargine (LANTUS) 100 UNIT/ML injection Inject 65 Units into the skin daily.  Marland Kitchen ketoconazole (NIZORAL) 2 % cream Apply 1 application topically 2  (two) times daily as needed for irritation. To groin, fave and buttocks.  Marland Kitchen levothyroxine (SYNTHROID) 50 MCG tablet TAKE 1 TABLET BY MOUTH ONCE DAILY BEFORE BREAKFAST  . losartan (COZAAR) 100 MG tablet Take 1 tablet (100 mg total) by mouth at bedtime.  . Magnesium 250 MG TABS Take 250 mg by mouth as needed (swelling). Take when you take your Torsemide.  . mometasone (ELOCON) 0.1 % ointment Apply topically daily. As needed to scalp and ears  . nitroGLYCERIN (NITROSTAT) 0.4 MG SL tablet Place 1 tablet (0.4 mg total) under the tongue every 5 (five) minutes as needed for chest pain.  Marland Kitchen omeprazole (PRILOSEC) 20 MG capsule Take 1 capsule by mouth once daily (Patient taking differently: Take 20 mg by mouth at bedtime. )  . potassium chloride (KLOR-CON) 10 MEQ tablet Take 1 tablet (10 meq) by mouth once a day on Mondays, Wednesdays, & Fridays (when taking torsemide)  . torsemide (DEMADEX) 20 MG tablet Take 1 tablet (20 mg) by mouth once a day on Mondays, Wednesdays, and Fridays   No facility-administered encounter medications on file as of 07/14/2020.     Objective:  BP Readings from Last 3 Encounters:  07/09/20 (!) 150/70  06/11/20 (!) 162/68  06/08/20 138/78    Wt Readings from Last 3 Encounters:  07/09/20 207 lb (93.9 kg)  06/11/20 (!) 211 lb (95.7 kg)  06/08/20 213 lb (96.6 kg)   Lab Results  Component Value Date   HGBA1C 9.8 04/29/2020    Goals Addressed  This Visit's Progress   . Chronic Disease Management       CARE PLAN ENTRY (see longitudinal plan of care for additional care plan information)  Current Barriers:  . Chronic Disease Management support and education needs Diabetes, Hypertension, Hyperlipidemia and CHF.  Case Manager Clinical Goal(s):  Over the next 120 days, patient will: . Not require hospitalization or emergent care d/t complications r/t chronic illnesses. . Attend all scheduled medical appointments . Take all medications a prescribed . Monitor  blood glucose levels and maintain a log. Marland Kitchen Routinely monitor BP and record readings. . Adhere to recommended cardiac prudent/diabetic diet. . Follow recommended safety measures to prevent falls and injuries.   Interventions:  . Inter-disciplinary care team collaboration (see longitudinal plan of care) . Discussed care management plan. Mr. Spieker reports doing well today. Since our last outreach he was evaluated for orthostatic hypotension and dizziness. Reports the dizziness has resolved and home BP readings have been within range. Denies falls. Torsemide was previously prescribed prn. Now prescribed three times a week on M-W-F. Reports minimal weight fluctuations since taking three times a week. Denies weight gain greater than 3 lbs overnight or 5 lbs within a week. Denies shortness of breath or edema to his lower extremities today. No changes in activity tolerance. He continues to monitor his blood sugar levels daily. Reports most fasting readings have been within range but does report elevated readings in the 200's. He attributes it to certain meal choices and is aware of indications for notifying his provider. He continues to attend appointments as scheduled and performs tasks in the home independently. No changes in care management needs. Will plan to follow up again in three months.    Patient Self Care Activities:  . Self administers medications as prescribed . Attends scheduled provider appointments . Calls pharmacy for medication refills . Performs ADL's independently . Performs IADL's independently.   Please see past updates related to this goal by clicking on the "Past Updates" button in the selected goal         PLAN A member of the care management team will follow up with Mr. Decarlo in November.   [facility] Care Management [facility] [phone]

## 2020-07-28 NOTE — Patient Instructions (Signed)
°  Thank you for allowing the Chronic Care Management team to participate in your care.  Goals Addressed            This Visit's Progress    Chronic Disease Management       CARE PLAN ENTRY (see longitudinal plan of care for additional care plan information)  Current Barriers:   Chronic Disease Management support and education needs Diabetes, Hypertension, Hyperlipidemia and CHF.  Case Manager Clinical Goal(s):  Over the next 120 days, patient will:  Not require hospitalization or emergent care d/t complications r/t chronic illnesses.  Attend all scheduled medical appointments  Take all medications a prescribed  Monitor blood glucose levels and maintain a log.  Routinely monitor BP and record readings.  Adhere to recommended cardiac prudent/diabetic diet.  Follow recommended safety measures to prevent falls and injuries.   Interventions:   Inter-disciplinary care team collaboration (see longitudinal plan of care)  Discussed care management plan. Joseph [name] reports doing well today. Since our last outreach he was evaluated for orthostatic hypotension and dizziness. Reports the dizziness has resolved and home BP readings have been within range. Denies falls. Torsemide was previously prescribed prn. Now prescribed three times a week on M-W-F. Reports minimal weight fluctuations since taking three times a week. Denies weight gain greater than 3 lbs overnight or 5 lbs within a week. Denies shortness of breath or edema to his lower extremities today. No changes in activity tolerance. He continues to monitor his blood sugar levels daily. Reports most fasting readings have been within range but does report elevated readings in the 200's. He attributes it to certain meal choices and is aware of indications for notifying his provider. He continues to attend appointments as scheduled and performs tasks in the home independently. No changes in care management needs. Will plan to follow up  again in three months.    Patient Self Care Activities:   Self administers medications as prescribed  Attends scheduled provider appointments  Calls pharmacy for medication refills  Performs ADL's independently  Performs IADL's independently.   Please see past updates related to this goal by clicking on the "Past Updates" button in the selected goal         Joseph [name] verbalized understanding of the information discussed during the telephonic outreach today. Declined need for a printed/mailed copy of the instructions.    A member of the care management team will follow up with Joseph [name] in November.    [facility] Care Management [facility] [phone]

## 2020-09-14 ENCOUNTER — Other Ambulatory Visit: Payer: Self-pay | Admitting: Family Medicine

## 2020-09-15 DIAGNOSIS — E1165 Type 2 diabetes mellitus with hyperglycemia: Secondary | ICD-10-CM | POA: Diagnosis not present

## 2020-09-15 LAB — HEMOGLOBIN A1C: Hemoglobin A1C: 9.4

## 2020-09-22 DIAGNOSIS — E1159 Type 2 diabetes mellitus with other circulatory complications: Secondary | ICD-10-CM | POA: Diagnosis not present

## 2020-09-22 DIAGNOSIS — E1142 Type 2 diabetes mellitus with diabetic polyneuropathy: Secondary | ICD-10-CM | POA: Diagnosis not present

## 2020-09-22 DIAGNOSIS — R809 Proteinuria, unspecified: Secondary | ICD-10-CM | POA: Diagnosis not present

## 2020-09-22 DIAGNOSIS — E1165 Type 2 diabetes mellitus with hyperglycemia: Secondary | ICD-10-CM | POA: Diagnosis not present

## 2020-09-22 DIAGNOSIS — E1129 Type 2 diabetes mellitus with other diabetic kidney complication: Secondary | ICD-10-CM | POA: Diagnosis not present

## 2020-09-22 DIAGNOSIS — Z794 Long term (current) use of insulin: Secondary | ICD-10-CM | POA: Diagnosis not present

## 2020-10-01 ENCOUNTER — Ambulatory Visit (INDEPENDENT_AMBULATORY_CARE_PROVIDER_SITE_OTHER): Payer: PPO | Admitting: Family Medicine

## 2020-10-01 ENCOUNTER — Encounter: Payer: Self-pay | Admitting: Family Medicine

## 2020-10-01 ENCOUNTER — Telehealth: Payer: Self-pay | Admitting: Cardiovascular Disease

## 2020-10-01 ENCOUNTER — Other Ambulatory Visit: Payer: Self-pay

## 2020-10-01 VITALS — BP 160/76 | HR 78 | Temp 98.4°F | Resp 18 | Wt 219.0 lb

## 2020-10-01 DIAGNOSIS — M25551 Pain in right hip: Secondary | ICD-10-CM

## 2020-10-01 DIAGNOSIS — M25561 Pain in right knee: Secondary | ICD-10-CM

## 2020-10-01 NOTE — Telephone Encounter (Signed)
   Patient calling to schedule asap visit with Dr. Rockey Situ to check for a blood clot.  He states he almost fell when his leg gave out.  C/o RLE pain weakness some redness and swelling but not in area with pain and swelling is BLE.   Scheduled 11/15 at 57 with gollan declined sooner with APP.

## 2020-10-01 NOTE — Telephone Encounter (Signed)
Spoke with the patient. Patient sts that he has developed increased swelling in his right leg. Patient complaians of soreness and weakness in that leg. He does use a walker to ambulate.  The leg is not warm to the touch. Swelling is from his feet to above his knee. He does reports a red area around his ankle. He is concerned that he may have a DVT.  He denies CP, sob, weight gain.  He is taking his torsemide 20 mg M,W, F.  He is scheduled to see Dr. Rockey Situ on 10/05/20. He again declined a sooner appt with a APP.  Adv the patient that he can keep that appt, but he should contact his pcp Dr. Rosanna Randy to be seen asap  evaluation.  Patient verbalized understanding and will call Dr. Alben Spittle office after this call. Patient voiced appreciation for the call.

## 2020-10-01 NOTE — Progress Notes (Signed)
Established patient visit   Patient: Joseph [name]   DOB: 15-May-1925   86 y.o. Male  MRN: [id number] Visit Date: 10/01/2020  Today's healthcare provider: Vernie Murders, PA   Chief Complaint  Patient presents with  . Leg Pain   Subjective    Leg Pain  Incident onset: 6 days ago. There was no injury mechanism. The pain is present in the left leg and right leg. The quality of the pain is described as aching. The pain has been intermittent since onset. Associated symptoms include muscle weakness (in right leg ). Pertinent negatives include no inability to bear weight, loss of motion, loss of sensation, numbness or tingling. Exacerbated by: walking. He has tried rest for the symptoms. The treatment provided significant relief.    Patient denies any swelling. He does have some redness around his ankles. He has an appointment with Cardiology on 10/05/2020.   Past Medical History:  Diagnosis Date  . CAD (coronary artery disease)    a. s/p CABG;  b. 07/2012 Cath: 3VD including 80 dLCX (med Rx), 4/4 patent grafts.  . Chronic diastolic CHF (congestive heart failure) ([place])    a. 07/2012 Echo: EF 55-60%, no rwma, Gr 1 DD, mild MR;  04/2014 Echo: EF 55-60%, no rwma, mild MR, mildly dil LA.  . Diabetes mellitus, type 2 ([place])   . HTN (hypertension)   . Hyperlipidemia   . Hypothyroidism   . Lower extremity edema    a. 03/2014 amlodipine d/c'd.  Marland Kitchen Spinal stenosis    Past Surgical History:  Procedure Laterality Date  . APPENDECTOMY    . CARDIAC CATHETERIZATION  07/2012   [facility]/no stents  . CHOLECYSTECTOMY    . CORNEAL TRANSPLANT    . CORONARY ARTERY BYPASS GRAFT  1994  . HEMORRHOID SURGERY    . LEFT HEART CATH AND CORS/GRAFTS ANGIOGRAPHY N/A 12/17/2019   Procedure: LEFT HEART CATH AND CORS/GRAFTS ANGIOGRAPHY;  Surgeon: Nelva Bush, MD;  Location: [facility];  Service: Cardiovascular;  Laterality: N/A;  . PROSTATE BIOPSY    . TOE SURGERY     ingrown toe nail   Social  History   Tobacco Use  . Smoking status: Former Smoker    Packs/day: 0.00    Years: 6.00    Pack years: 0.00    Types: Pipe, Cigars    Quit date: 12/23/1980    Years since quitting: 39.8  . Smokeless tobacco: Never Used  Vaping Use  . Vaping Use: Never used  Substance Use Topics  . Alcohol use: No  . Drug use: No   Family History  Problem Relation Age of Onset  . Heart failure Mother   . Heart attack Father   . Stroke Brother   . Heart failure Maternal Uncle   . Heart failure Maternal Grandfather   . Sleep apnea Daughter   . Sleep apnea Son   . Kidney Stones Son    No Known Allergies     Medications: Outpatient Medications Prior to Visit  Medication Sig  . aspirin EC 81 MG tablet Take 1 tablet (81 mg total) by mouth daily.  Marland Kitchen atorvastatin (LIPITOR) 80 MG tablet Take 1 tablet (80 mg total) by mouth every evening.  . Cholecalciferol 125 MCG (5000 UT) capsule Take 5,000 Units by mouth at bedtime.  . clobetasol cream (TEMOVATE) 8.36 % Apply 1 application topically 2 (two) times daily. As needed to hands and elbows  . clopidogrel (PLAVIX) 75 MG tablet Take 1 tablet (  75 mg total) by mouth daily.  . Continuous Blood Gluc Sensor (FREESTYLE LIBRE 14 DAY SENSOR) MISC USE AS DIRECTED EVERY 14 DAYS  . EUTHYROX 50 MCG tablet TAKE 1 TABLET BY MOUTH ONCE DAILY BEFORE BREAKFAST  . hydrocortisone 2.5 % cream Apply 1 application topically 2 (two) times daily. As needed to face, crease, groin and butt  . insulin aspart (NOVOLOG) 100 UNIT/ML injection Inject into the skin 3 (three) times daily before meals. Inject 20 units in the morning / 15 units at 12:00pm / and 20 units at night time.  . insulin glargine (LANTUS) 100 UNIT/ML injection Inject 65 Units into the skin daily.  Marland Kitchen ketoconazole (NIZORAL) 2 % cream Apply 1 application topically 2 (two) times daily as needed for irritation. To groin, fave and buttocks.  Marland Kitchen losartan (COZAAR) 100 MG tablet Take 1 tablet (100 mg total) by mouth at  bedtime.  . Magnesium 250 MG TABS Take 250 mg by mouth as needed (swelling). Take when you take your Torsemide.  . mometasone (ELOCON) 0.1 % ointment Apply topically daily. As needed to scalp and ears  . nitroGLYCERIN (NITROSTAT) 0.4 MG SL tablet Place 1 tablet (0.4 mg total) under the tongue every 5 (five) minutes as needed for chest pain.  Marland Kitchen omeprazole (PRILOSEC) 20 MG capsule Take 1 capsule by mouth once daily (Patient taking differently: Take 20 mg by mouth at bedtime. )  . potassium chloride (KLOR-CON) 10 MEQ tablet Take 1 tablet (10 meq) by mouth once a day on Mondays, Wednesdays, & Fridays (when taking torsemide)  . torsemide (DEMADEX) 20 MG tablet Take 1 tablet (20 mg) by mouth once a day on Mondays, Wednesdays, and Fridays   No facility-administered medications prior to visit.    Review of Systems  Constitutional: Negative for appetite change, chills and fever.  Respiratory: Negative for chest tightness, shortness of breath and wheezing.   Cardiovascular: Negative for chest pain and palpitations.  Gastrointestinal: Negative for abdominal pain, nausea and vomiting.  Musculoskeletal: Positive for gait problem and myalgias (leg pain).  Neurological: Positive for weakness (right leg). Negative for tingling and numbness.      Objective    BP (!) 160/76 (BP Location: Left Arm, Patient Position: Sitting, Cuff Size: Large)   Pulse 78   Temp 98.4 F (36.9 C) (Oral)   Resp 18   Wt 219 lb (99.3 kg)   SpO2 96% Comment: room air  BMI 34.30 kg/m  BP Readings from Last 3 Encounters:  10/01/20 (!) 160/76  07/09/20 (!) 150/70  06/11/20 (!) 162/68   Wt Readings from Last 3 Encounters:  10/01/20 219 lb (99.3 kg)  07/09/20 207 lb (93.9 kg)  06/11/20 (!) 211 lb (95.7 kg)   Physical Exam Constitutional:      General: He is not in acute distress.    Appearance: He is well-developed.  HENT:     Head: Normocephalic and atraumatic.     Right Ear: Hearing normal.     Left Ear: Hearing  normal.     Nose: Nose normal.  Eyes:     General: Lids are normal. No scleral icterus.       Right eye: No discharge.        Left eye: No discharge.     Conjunctiva/sclera: Conjunctivae normal.  Pulmonary:     Effort: Pulmonary effort is normal. No respiratory distress.  Musculoskeletal:        General: Normal range of motion.     Comments: Mild discomfort in  the right knee and hip with weight bearing only. Calf circumference 15.1/4" right and 15 3/8" left. Negative Homan's sign and no pitting edema today.  Skin:    Findings: No lesion or rash.  Neurological:     Mental Status: He is alert and oriented to person, place, and time.  Psychiatric:        Speech: Speech normal.        Behavior: Behavior normal.        Thought Content: Thought content normal.      No results found for any visits on 10/01/20.  Assessment & Plan     1. Acute pain of right knee Onset with sensation of it "giving away" on 09-25-20. No fall or known injury. No joint redness with minimal crepitus. No clicking or locking. Will check x-ray and may try Voltaren Gel and/or Tumeric. Presently on Plavix and ASA due to CAD with CABG in 1994. No calf swelling today. - DG Knee Complete 4 Views Right  2. Right hip pain Aching pain in the right hip area with weight bearing, only. No pain in knee or hip at rest. Continue moist heat applications and check x-rays for signs of bony injury or arthritis. - DG Hip Unilat W OR W/O Pelvis 2-3 Views Right   No follow-ups on file.      Andres Shad, PA, have reviewed all documentation for this visit. The documentation on 10/01/20 for the exam, diagnosis, procedures, and orders are all accurate and complete.    Vernie Murders, [place] [phone] (phone) [phone] (fax)  [place]

## 2020-10-02 ENCOUNTER — Other Ambulatory Visit: Payer: Self-pay

## 2020-10-02 ENCOUNTER — Ambulatory Visit
Admission: RE | Admit: 2020-10-02 | Discharge: 2020-10-02 | Disposition: A | Discharge status: Home / Self Care | Payer: PPO | Attending: Family Medicine | Admitting: Family Medicine

## 2020-10-02 ENCOUNTER — Ambulatory Visit
Admission: RE | Admit: 2020-10-02 | Discharge: 2020-10-02 | Disposition: A | Discharge status: Home / Self Care | Payer: PPO | Source: Ambulatory Visit | Attending: Family Medicine | Admitting: Family Medicine

## 2020-10-02 DIAGNOSIS — M25551 Pain in right hip: Secondary | ICD-10-CM | POA: Insufficient documentation

## 2020-10-02 DIAGNOSIS — M25561 Pain in right knee: Secondary | ICD-10-CM | POA: Diagnosis not present

## 2020-10-02 DIAGNOSIS — M1711 Unilateral primary osteoarthritis, right knee: Secondary | ICD-10-CM | POA: Diagnosis not present

## 2020-10-02 DIAGNOSIS — M1611 Unilateral primary osteoarthritis, right hip: Secondary | ICD-10-CM | POA: Diagnosis not present

## 2020-10-02 DIAGNOSIS — M25461 Effusion, right knee: Secondary | ICD-10-CM | POA: Diagnosis not present

## 2020-10-05 ENCOUNTER — Ambulatory Visit: Payer: PPO | Admitting: Cardiovascular Disease

## 2020-10-05 ENCOUNTER — Telehealth: Payer: Self-pay | Admitting: Family Medicine

## 2020-10-05 NOTE — Telephone Encounter (Signed)
Patient is calling for results for his DG Knee Complete 4 Views Right (Accession [id number]) (Order [id number]) & hip.   Please advise Cb- [phone]

## 2020-10-06 NOTE — Telephone Encounter (Signed)
Pt given xray results per notes of Vernie Murders PA on 10/05/20. Pt verbalized understanding.

## 2020-10-13 ENCOUNTER — Ambulatory Visit: Payer: Self-pay | Admitting: Family Medicine

## 2020-10-13 ENCOUNTER — Ambulatory Visit: Payer: Self-pay

## 2020-10-13 DIAGNOSIS — E1151 Type 2 diabetes mellitus with diabetic peripheral angiopathy without gangrene: Secondary | ICD-10-CM

## 2020-10-13 DIAGNOSIS — Z794 Long term (current) use of insulin: Secondary | ICD-10-CM

## 2020-10-13 DIAGNOSIS — I5032 Chronic diastolic (congestive) heart failure: Secondary | ICD-10-CM

## 2020-10-13 NOTE — Chronic Care Management (AMB) (Signed)
Chronic Care Management   Follow Up Note   10/13/2020 Name: Joseph [name] MRN: [id number] DOB: August 02, 1925  Primary Care Provider: Jerrol [name]., MD Reason for referral : Chronic Care Management   Joseph [name] is a 86 y.o. year old male who is a primary care patient of Joseph [name]., MD. He is currently engaged with the Chronic Care Management team. A routine telephonic outreach was conducted today.  Review of Joseph [name] status, including review of consultants reports, relevant labs and test results was conducted today. Collaboration with appropriate care team members was performed as part of the comprehensive evaluation and provision of chronic care management services.    SDOH (Social Determinants of Health) assessments performed: No    Outpatient Encounter Medications as of 10/13/2020  Medication Sig  . aspirin EC 81 MG tablet Take 1 tablet (81 mg total) by mouth daily.  Marland Kitchen atorvastatin (LIPITOR) 80 MG tablet Take 1 tablet (80 mg total) by mouth every evening.  . Cholecalciferol 125 MCG (5000 UT) capsule Take 5,000 Units by mouth at bedtime.  . clobetasol cream (TEMOVATE) 0.10 % Apply 1 application topically 2 (two) times daily. As needed to hands and elbows  . clopidogrel (PLAVIX) 75 MG tablet Take 1 tablet (75 mg total) by mouth daily.  . Continuous Blood Gluc Sensor (FREESTYLE LIBRE 14 DAY SENSOR) MISC USE AS DIRECTED EVERY 14 DAYS  . EUTHYROX 50 MCG tablet TAKE 1 TABLET BY MOUTH ONCE DAILY BEFORE BREAKFAST  . hydrocortisone 2.5 % cream Apply 1 application topically 2 (two) times daily. As needed to face, crease, groin and butt  . insulin aspart (NOVOLOG) 100 UNIT/ML injection Inject into the skin 3 (three) times daily before meals. Inject 20 units in the morning / 15 units at 12:00pm / and 20 units at night time.  . insulin glargine (LANTUS) 100 UNIT/ML injection Inject 65 Units into the skin daily.  Marland Kitchen ketoconazole (NIZORAL) 2 % cream Apply 1  application topically 2 (two) times daily as needed for irritation. To groin, fave and buttocks.  Marland Kitchen losartan (COZAAR) 100 MG tablet Take 1 tablet (100 mg total) by mouth at bedtime.  . Magnesium 250 MG TABS Take 250 mg by mouth as needed (swelling). Take when you take your Torsemide.  . mometasone (ELOCON) 0.1 % ointment Apply topically daily. As needed to scalp and ears  . nitroGLYCERIN (NITROSTAT) 0.4 MG SL tablet Place 1 tablet (0.4 mg total) under the tongue every 5 (five) minutes as needed for chest pain.  Marland Kitchen omeprazole (PRILOSEC) 20 MG capsule Take 1 capsule by mouth once daily (Patient taking differently: Take 20 mg by mouth at bedtime. )  . potassium chloride (KLOR-CON) 10 MEQ tablet Take 1 tablet (10 meq) by mouth once a day on Mondays, Wednesdays, & Fridays (when taking torsemide)  . torsemide (DEMADEX) 20 MG tablet Take 1 tablet (20 mg) by mouth once a day on Mondays, Wednesdays, and Fridays   No facility-administered encounter medications on file as of 10/13/2020.     Goals Addressed            This Visit's Progress   . Chronic Disease Management       CARE PLAN ENTRY (see longitudinal plan of care for additional care plan information)  Current Barriers:  . Chronic Disease Management support and education needs Diabetes, Hypertension, Hyperlipidemia and CHF.  Case Manager Clinical Goal(s):  Over the next 120 days, patient will: . Not require hospitalization or emergent care  d/t complications r/t chronic illnesses. . Attend all scheduled medical appointments . Take all medications a prescribed . Monitor blood glucose levels and maintain a log. Marland Kitchen Routinely monitor BP and record readings. . Adhere to recommended cardiac prudent/diabetic diet. . Follow recommended safety measures to prevent falls and injuries.   Interventions:  . Inter-disciplinary care team collaboration (see longitudinal plan of care) . Discussed compliance with medications and reviewed FBS readings.  Reports taking medications as prescribed and adjusting insulin dose as recommended. Reports a 7 day FBS average of 137 mg/dl. He recalls a recent low reading of 48 mg/dl. He did not experience symptoms and reports drinking orange juice immediately raising his level to 150 mg/dl. We reviewed s/sx of hypoglycemia and hyperglycemia along with the recommended interventions. He verbalized understanding of need to always monitor readings prior to administering insulin. Feels that he is doing "ok" with maintaining a diabetic diet. His current A1C is 9.8%.   . Discussed care management plan. Joseph [name] reports doing well. Denies symptoms r/t CHF complications. Reports lower extremity edema has improved and his weights have remained stable. Currently taking torsemide as needed. No changes in care management needs. Agreed to follow-up next month. Will plan to complete routine outreach until his A1C is within goal.     Patient Self Care Activities:  . Self administers medications as prescribed . Attends scheduled provider appointments . Calls pharmacy for medication refills . Performs ADL's independently . Performs IADL's independently.   Please see past updates related to this goal by clicking on the "Past Updates" button in the selected goal          PLAN A member of the care management team will follow-up with Joseph [name] next month.    [facility] Care Management [facility] [phone]

## 2020-10-16 DIAGNOSIS — Z20822 Contact with and (suspected) exposure to covid-19: Secondary | ICD-10-CM | POA: Diagnosis not present

## 2020-10-20 DIAGNOSIS — Z20822 Contact with and (suspected) exposure to covid-19: Secondary | ICD-10-CM | POA: Diagnosis not present

## 2020-10-21 ENCOUNTER — Other Ambulatory Visit: Payer: Self-pay

## 2020-10-21 ENCOUNTER — Ambulatory Visit (INDEPENDENT_AMBULATORY_CARE_PROVIDER_SITE_OTHER): Payer: PPO | Admitting: Family Medicine

## 2020-10-21 ENCOUNTER — Encounter: Payer: Self-pay | Admitting: Family Medicine

## 2020-10-21 VITALS — BP 183/77 | HR 85 | Temp 98.0°F | Wt 219.0 lb

## 2020-10-21 DIAGNOSIS — E1151 Type 2 diabetes mellitus with diabetic peripheral angiopathy without gangrene: Secondary | ICD-10-CM | POA: Diagnosis not present

## 2020-10-21 DIAGNOSIS — M545 Low back pain, unspecified: Secondary | ICD-10-CM

## 2020-10-21 DIAGNOSIS — G8929 Other chronic pain: Secondary | ICD-10-CM | POA: Diagnosis not present

## 2020-10-21 DIAGNOSIS — I25708 Atherosclerosis of coronary artery bypass graft(s), unspecified, with other forms of angina pectoris: Secondary | ICD-10-CM

## 2020-10-21 DIAGNOSIS — M47816 Spondylosis without myelopathy or radiculopathy, lumbar region: Secondary | ICD-10-CM | POA: Diagnosis not present

## 2020-10-21 DIAGNOSIS — Z951 Presence of aortocoronary bypass graft: Secondary | ICD-10-CM

## 2020-10-21 DIAGNOSIS — Z794 Long term (current) use of insulin: Secondary | ICD-10-CM

## 2020-10-21 DIAGNOSIS — I5033 Acute on chronic diastolic (congestive) heart failure: Secondary | ICD-10-CM | POA: Diagnosis not present

## 2020-10-21 DIAGNOSIS — I10 Essential (primary) hypertension: Secondary | ICD-10-CM | POA: Diagnosis not present

## 2020-10-21 NOTE — Progress Notes (Signed)
Established patient visit   Patient: Joseph [name]   DOB: 1924/12/26   86 y.o. Male  MRN: [id number] Visit Date: 10/21/2020  Today's healthcare provider: Wilhemena Durie, MD   Chief Complaint  Patient presents with  . Diabetes  . Hyperlipidemia  . Hypertension   Subjective    HPI  Patient is actually feeling fairly well.  His wife just had a CVA.  They have been married 30 years.  Home blood pressures are reading 140/80 range. He denies a COVID booster and a flu shot today. Hypertension, follow-up  BP Readings from Last 3 Encounters:  10/21/20 (!) 183/77  10/01/20 (!) 160/76  07/09/20 (!) 150/70   Wt Readings from Last 3 Encounters:  10/21/20 219 lb (99.3 kg)  10/01/20 219 lb (99.3 kg)  07/09/20 207 lb (93.9 kg)     He was last seen for hypertension 4 months ago.  BP at that visit was 162/68. Management since that visit includes; Fairly good control but would not change any medications in this 86 year old. He reports excellent compliance with treatment. He is not having side effects.  He is adherent to low salt diet.   Outside blood pressures are 140's/80's.  He does not smoke.  Use of agents associated with hypertension: none.    Follow up for Right knee and hip pain  The patient was last seen for this 2 weeks ago. Changes made at last visit include trying OTC Tumeric.  He reports excellent compliance with treatment. He feels that condition is Unchanged. He is not having side effects.   -----------------------------------------------------------------------------------------   ---------------------------------------------------------------------------------------------------   Patient Active Problem List   Diagnosis Date Noted  . AV block, [place] 1   . NSTEMI (non-ST elevated myocardial infarction) ([place])   . ACS (acute coronary syndrome) ([place]) 12/16/2019  . Acute cholecystitis   . Calculus of gallbladder with acute cholecystitis without  obstruction   . Diverticulitis of large intestine without perforation or abscess without bleeding   . Pain of upper abdomen   . SOB (shortness of breath)   . S/P CABG (coronary artery bypass graft)   . Morbid obesity due to excess calories ([place])   . Acute respiratory failure with hypoxia ([place]) 03/21/2016  . Back pain, chronic 07/28/2015  . Benign fibroma of prostate 07/28/2015  . Acid reflux 07/28/2015  . Adult hypothyroidism 07/28/2015  . Eunuchoidism 07/28/2015  . Neuropathy 07/28/2015  . Arthritis, degenerative 07/28/2015  . Psoriasis 07/28/2015  . Lumbar spondylosis 05/08/2015  . Obesity 09/09/2014  . Bilateral leg weakness 09/09/2014  . Acute on chronic diastolic heart failure ([place]) 05/13/2014  . Bilateral leg edema 04/15/2014  . Bradycardia 08/13/2013  . Edema 06/21/2012  . Fatigue 09/15/2011  . Type 2 diabetes mellitus with diabetic peripheral angiopathy without gangrene, with long-term current use of insulin ([place]) 09/15/2011  . DYSPNEA 06/09/2009  . Hyperlipidemia 06/07/2009  . Essential hypertension 06/07/2009  . CAD, ARTERY BYPASS GRAFT 06/07/2009   Past Medical History:  Diagnosis Date  . CAD (coronary artery disease)    a. s/p CABG;  b. 07/2012 Cath: 3VD including 80 dLCX (med Rx), 4/4 patent grafts.  . Chronic diastolic CHF (congestive heart failure) ([place])    a. 07/2012 Echo: EF 55-60%, no rwma, Gr 1 DD, mild MR;  04/2014 Echo: EF 55-60%, no rwma, mild MR, mildly dil LA.  . Diabetes mellitus, type 2 ([place])   . HTN (hypertension)   . Hyperlipidemia   . Hypothyroidism   .  Lower extremity edema    a. 03/2014 amlodipine d/c'd.  Marland Kitchen Spinal stenosis    Social History   Tobacco Use  . Smoking status: Former Smoker    Packs/day: 0.00    Years: 6.00    Pack years: 0.00    Types: Pipe, Cigars    Quit date: 12/23/1980    Years since quitting: 39.8  . Smokeless tobacco: Never Used  Vaping Use  . Vaping Use: Never used  Substance Use Topics  . Alcohol use: No  . Drug  use: No   No Known Allergies   Medications: Outpatient Medications Prior to Visit  Medication Sig  . aspirin EC 81 MG tablet Take 1 tablet (81 mg total) by mouth daily.  Marland Kitchen atorvastatin (LIPITOR) 80 MG tablet Take 1 tablet (80 mg total) by mouth every evening.  . Cholecalciferol 125 MCG (5000 UT) capsule Take 5,000 Units by mouth at bedtime.  . clobetasol cream (TEMOVATE) 3.79 % Apply 1 application topically 2 (two) times daily. As needed to hands and elbows  . clopidogrel (PLAVIX) 75 MG tablet Take 1 tablet (75 mg total) by mouth daily.  . Continuous Blood Gluc Sensor (FREESTYLE LIBRE 14 DAY SENSOR) MISC USE AS DIRECTED EVERY 14 DAYS  . EUTHYROX 50 MCG tablet TAKE 1 TABLET BY MOUTH ONCE DAILY BEFORE BREAKFAST  . hydrocortisone 2.5 % cream Apply 1 application topically 2 (two) times daily. As needed to face, crease, groin and butt  . insulin aspart (NOVOLOG) 100 UNIT/ML injection Inject into the skin 3 (three) times daily before meals. Inject 20 units in the morning / 15 units at 12:00pm / and 20 units at night time.  . insulin glargine (LANTUS) 100 UNIT/ML injection Inject 65 Units into the skin daily.  Marland Kitchen ketoconazole (NIZORAL) 2 % cream Apply 1 application topically 2 (two) times daily as needed for irritation. To groin, fave and buttocks.  Marland Kitchen losartan (COZAAR) 100 MG tablet Take 1 tablet (100 mg total) by mouth at bedtime.  . Magnesium 250 MG TABS Take 250 mg by mouth as needed (swelling). Take when you take your Torsemide.  . mometasone (ELOCON) 0.1 % ointment Apply topically daily. As needed to scalp and ears  . nitroGLYCERIN (NITROSTAT) 0.4 MG SL tablet Place 1 tablet (0.4 mg total) under the tongue every 5 (five) minutes as needed for chest pain.  Marland Kitchen omeprazole (PRILOSEC) 20 MG capsule Take 1 capsule by mouth once daily (Patient taking differently: Take 20 mg by mouth at bedtime. )  . potassium chloride (KLOR-CON) 10 MEQ tablet Take 1 tablet (10 meq) by mouth once a day on Mondays,  Wednesdays, & Fridays (when taking torsemide)  . torsemide (DEMADEX) 20 MG tablet Take 1 tablet (20 mg) by mouth once a day on Mondays, Wednesdays, and Fridays   No facility-administered medications prior to visit.    Review of Systems  Constitutional: Negative.  Negative for appetite change, chills and fever.  HENT: Positive for tinnitus.   Respiratory: Negative for chest tightness, shortness of breath and wheezing.   Cardiovascular: Negative.  Negative for chest pain and palpitations.  Gastrointestinal: Negative.  Negative for abdominal pain, nausea and vomiting.  Musculoskeletal: Positive for arthralgias and back pain. Negative for gait problem, joint swelling, myalgias, neck pain and neck stiffness.  Neurological: Negative for dizziness, light-headedness and headaches.      Objective    BP (!) 183/77 (BP Location: Right Arm, Patient Position: Sitting, Cuff Size: Large)   Pulse 85   Temp 98  F (36.7 C) (Oral)   Wt 219 lb (99.3 kg)   SpO2 97%   BMI 34.30 kg/m    Physical Exam Vitals reviewed.  Constitutional:      Appearance: He is well-developed. He is obese.  HENT:     Head: Normocephalic and atraumatic.     Right Ear: External ear normal.     Left Ear: External ear normal.     Nose: Nose normal.  Eyes:     General: No scleral icterus.    Conjunctiva/sclera: Conjunctivae normal.  Neck:     Thyroid: No thyromegaly.  Cardiovascular:     Rate and Rhythm: Normal rate and regular rhythm.     Heart sounds: Normal heart sounds.  Pulmonary:     Effort: Pulmonary effort is normal.     Breath sounds: Normal breath sounds. No wheezing.  Abdominal:     Palpations: Abdomen is soft.  Musculoskeletal:     Right lower leg: Edema present.     Comments: 1+ edema.  Skin:    General: Skin is warm and dry.  Neurological:     General: No focal deficit present.     Mental Status: He is alert and oriented to person, place, and time.  Psychiatric:        Mood and Affect: Mood  normal.        Behavior: Behavior normal.        Thought Content: Thought content normal.        Judgment: Judgment normal.       No results found for any visits on 10/21/20.  Assessment & Plan     1. Type 2 diabetes mellitus with diabetic peripheral angiopathy without gangrene, with long-term current use of insulin ([place]) By endocrinology.  2. Atherosclerosis of coronary artery bypass graft of native heart with stable angina pectoris ([place]) All risk factors treated.  3. Acute on chronic diastolic heart failure (H[facility]) Clinically stable.  4. Lumbar spondylosis Pain stable  5. S/P CABG (coronary artery bypass graft)   6. Morbid obesity due to excess calories ([place]) Role of weight discussed.  7. Chronic bilateral low back pain without sciatica   8. Essential hypertension Pressure at home is 140/80 or less.   No follow-ups on file.         Ramesha Poster Cranford Mon, MD  [facility] [phone] (phone) [phone] (fax)  [place]

## 2020-10-26 NOTE — Patient Instructions (Addendum)
Thank you for allowing the Chronic Care Management team to participate in your care.    Goals Addressed            This Visit's Progress   . Chronic Disease Management       CARE PLAN ENTRY (see longitudinal plan of care for additional care plan information)  Current Barriers:  . Chronic Disease Management support and education needs Diabetes, Hypertension, Hyperlipidemia and CHF.  Case Manager Clinical Goal(s):  Over the next 120 days, patient will: . Not require hospitalization or emergent care d/t complications r/t chronic illnesses. . Attend all scheduled medical appointments . Take all medications a prescribed . Monitor blood glucose levels and maintain a log. Marland Kitchen Routinely monitor BP and record readings. . Adhere to recommended cardiac prudent/diabetic diet. . Follow recommended safety measures to prevent falls and injuries.   Interventions:  . Inter-disciplinary care team collaboration (see longitudinal plan of care) . Discussed compliance with medications and reviewed FBS readings. Reports taking medications as prescribed and adjusting insulin dose as recommended. Reports a 7 day FBS average of 137 mg/dl. He recalls a recent low reading of 48 mg/dl. He did not experience symptoms and reports drinking orange juice immediately raising his level to 150 mg/dl. We reviewed s/sx of hypoglycemia and hyperglycemia along with the recommended interventions. He verbalized understanding of need to always monitor readings prior to administering insulin. Feels that he is doing "ok" with maintaining a diabetic diet. His current A1C is 9.8%.   . Discussed care management plan. Joseph [name] reports doing well. Denies symptoms r/t CHF complications. Reports lower extremity edema has improved and his weights have remained stable. Currently taking torsemide as needed. No changes in care management needs. Agreed to follow-up next month. Will plan to complete routine outreach until his A1C is within  goal.     Patient Self Care Activities:  . Self administers medications as prescribed . Attends scheduled provider appointments . Calls pharmacy for medication refills . Performs ADL's independently . Performs IADL's independently.   Please see past updates related to this goal by clicking on the "Past Updates" button in the selected goal        Joseph [name] verbalized understanding of the information discussed during the telephonic outreach today. Declined need for a mailed/printed copy of the information.   A member of the care management team will follow-up with Joseph [name] next month.    Cristy Friedlander [facility] Care Management [facility] [phone]

## 2020-11-02 ENCOUNTER — Other Ambulatory Visit: Payer: Self-pay | Admitting: Family Medicine

## 2020-11-02 NOTE — Telephone Encounter (Signed)
Requested Prescriptions  Pending Prescriptions Disp Refills  . omeprazole (PRILOSEC) 20 MG capsule [Pharmacy Med Name: OMEPRAZOLE 20MG  CAP] 90 capsule 0    Sig: Take 1 capsule by mouth once daily     Gastroenterology: Proton Pump Inhibitors Passed - 11/02/2020  9:45 AM      Passed - Valid encounter within last 12 months    Recent Outpatient Visits          1 week ago Type 2 diabetes mellitus with diabetic peripheral angiopathy without gangrene, with long-term current use of insulin [facility])   [facility] [name]., MD   1 month ago Acute pain of right knee   [place], Vickki Muff, PA-C   4 months ago Essential hypertension   [facility] [name]., MD   8 months ago Flank pain   [facility] [name]., MD   10 months ago Atherosclerosis of coronary artery bypass graft of native heart with stable angina pectoris [facility])   [facility] [name]., MD      Future Appointments            In 3 months [name]., MD [facility], [place]

## 2020-11-05 ENCOUNTER — Ambulatory Visit: Payer: Self-pay

## 2020-11-05 DIAGNOSIS — E1151 Type 2 diabetes mellitus with diabetic peripheral angiopathy without gangrene: Secondary | ICD-10-CM

## 2020-11-09 NOTE — Chronic Care Management (AMB) (Signed)
Chronic Care Management   Follow Up Note   11/09/2020 Name: Joseph [name] MRN: [id number] DOB: [birth year]  Primary Care Provider: [name]., MD Reason for referral : Primary Care Provider  Joseph [name] is a 86 y.o. year old male who is a primary care patient of [name]., MD. He is currently engaged with the chronic care management team. A routine telephonic outreach was conducted today.   Review of Mr. Joseph [name] status, including review of consultants reports, relevant labs and test results was conducted today. Collaboration with appropriate care team members was performed as part of the comprehensive evaluation and provision of chronic care management services.    SDOH (Social Determinants of Health) assessments performed: No     Outpatient Encounter Medications as of 11/05/2020  Medication Sig Note  . aspirin EC 81 MG tablet Take 1 tablet (81 mg total) by mouth daily.   Marland Kitchen atorvastatin (LIPITOR) 80 MG tablet Take 1 tablet (80 mg total) by mouth every evening.   . Cholecalciferol 125 MCG (5000 UT) capsule Take 5,000 Units by mouth at bedtime.   . clobetasol cream (TEMOVATE) 4.09 % Apply 1 application topically 2 (two) times daily. As needed to hands and elbows   . clopidogrel (PLAVIX) 75 MG tablet Take 1 tablet (75 mg total) by mouth daily.   . Continuous Blood Gluc Sensor (FREESTYLE LIBRE 14 DAY SENSOR) MISC USE AS DIRECTED EVERY 14 DAYS   . EUTHYROX 50 MCG tablet TAKE 1 TABLET BY MOUTH ONCE DAILY BEFORE BREAKFAST   . hydrocortisone 2.5 % cream Apply 1 application topically 2 (two) times daily. As needed to face, crease, groin and butt   . insulin aspart (NOVOLOG) 100 UNIT/ML injection Inject into the skin 3 (three) times daily before meals. Inject 20 units in the morning / 15 units at 12:00pm / and 20 units at night time.   . insulin glargine (LANTUS) 100 UNIT/ML injection Inject 65 Units into the skin daily.   Marland Kitchen ketoconazole (NIZORAL) 2 % cream  Apply 1 application topically 2 (two) times daily as needed for irritation. To groin, fave and buttocks.   Marland Kitchen losartan (COZAAR) 100 MG tablet Take 1 tablet (100 mg total) by mouth at bedtime.   . Magnesium 250 MG TABS Take 250 mg by mouth as needed (swelling). Take when you take your Torsemide.   . mometasone (ELOCON) 0.1 % ointment Apply topically daily. As needed to scalp and ears   . nitroGLYCERIN (NITROSTAT) 0.4 MG SL tablet Place 1 tablet (0.4 mg total) under the tongue every 5 (five) minutes as needed for chest pain.   Marland Kitchen omeprazole (PRILOSEC) 20 MG capsule Take 1 capsule by mouth once daily   . potassium chloride (KLOR-CON) 10 MEQ tablet Take 1 tablet (10 meq) by mouth once a day on Mondays, Wednesdays, & Fridays (when taking torsemide)   . torsemide (DEMADEX) 20 MG tablet Take 1 tablet (20 mg) by mouth once a day on Mondays, Wednesdays, and Fridays 11/05/2020: Reports taking as needed.   No facility-administered encounter medications on file as of 11/05/2020.       Goals Addressed            This Visit's Progress   . Chronic Disease Management       CARE PLAN ENTRY (see longitudinal plan of care for additional care plan information)  Current Barriers:  . Chronic Disease Management support and education needs Diabetes, Hypertension, Hyperlipidemia and CHF.  Case Manager Clinical Goal(s):  Over the next 120 days, patient will: . Not require hospitalization or emergent care d/t complications r/t chronic illnesses.-Complete . Attend all scheduled medical appointments-Complete . Take all medications a prescribed . Monitor blood glucose levels and report lows less than 70 mg/dl. . Routinely monitor BP and record readings.-Complete . Adhere to recommended cardiac prudent/diabetic diet. . Follow recommended safety measures to prevent falls and injuries.-Complete   Interventions:  . Inter-disciplinary care team collaboration (see longitudinal plan of care) . Discussed adjustments  with medications and reviewed FBS readings. Reports taking medications as prescribed. Insulin dose was recently decreased d/t several low readings in the 40''s and 50's. We thoroughly discussed the recent instructions provided by Dr. Gabriel Carina. He was instructed to decrease his Lantus insulin to 50 units at bedtime. Instructed to check readings at breakfast, lunch and bedtime. Instructed to follow up on 11/09/20 with readings lower than 70 mg/dl.  He reports not experiencing symptoms when his readings are low but verbalized appropriate interventions to increase his blood glucose and keeps  juice readily available.  . Discussed care management plan. Denies urgent concerns or changes in care management needs.   Patient Self Care Activities:  . Self administers medications as prescribed . Attends scheduled provider appointments . Calls pharmacy for medication refills . Performs ADL's independently . Performs IADL's independently.   Please see past updates related to this goal by clicking on the "Past Updates" button in the selected goal           PLAN A member of the care management team will follow-up with Joseph [name] within the next month.    Cristy Friedlander [facility] Care Management [facility] [phone]

## 2020-11-09 NOTE — Patient Instructions (Addendum)
Thank you for allowing the Chronic Care Management team to participate in your care.  Goals Addressed            This Visit's Progress   . Chronic Disease Management       CARE PLAN ENTRY (see longitudinal plan of care for additional care plan information)  Current Barriers:  . Chronic Disease Management support and education needs Diabetes, Hypertension, Hyperlipidemia and CHF.  Case Manager Clinical Goal(s):  Over the next 120 days, patient will: . Not require hospitalization or emergent care d/t complications r/t chronic illnesses.-Complete . Attend all scheduled medical appointments-Complete . Take all medications a prescribed . Monitor blood glucose levels and report lows less than 70 mg/dl. . Routinely monitor BP and record readings.-Complete . Adhere to recommended cardiac prudent/diabetic diet. . Follow recommended safety measures to prevent falls and injuries.-Complete   Interventions:  . Inter-disciplinary care team collaboration (see longitudinal plan of care) . Discussed adjustments with medications and reviewed FBS readings. Reports taking medications as prescribed. Insulin dose was recently decreased d/t several low readings in the 40''s and 50's. We thoroughly discussed the recent instructions provided by Joseph [name]. He was instructed to decrease his Lantus insulin to 50 units at bedtime. Instructed to check readings at breakfast, lunch and bedtime. Instructed to follow up on 11/09/20 with readings lower than 70 mg/dl.  He reports not experiencing symptoms when his readings are low but verbalized appropriate interventions to increase his blood glucose and keeps  juice readily available.  . Discussed care management plan. Denies urgent concerns or changes in care management needs.   Patient Self Care Activities:  . Self administers medications as prescribed . Attends scheduled provider appointments . Calls pharmacy for medication refills . Performs ADL's  independently . Performs IADL's independently.   Please see past updates related to this goal by clicking on the "Past Updates" button in the selected goal        Joseph [name] verbalized understanding of the information discussed during the telephonic outreach today. Declined need for mailed/printed instructions.   A member of the care management team will follow-up with Joseph [name] within the next month.    Joseph [name] [facility] Care Management [facility] [phone]

## 2020-11-30 ENCOUNTER — Ambulatory Visit: Payer: Self-pay | Admitting: *Deleted

## 2020-11-30 NOTE — Telephone Encounter (Signed)
Patient's daughter would like advice as to whether or not she should encourage her father should go to the hospital. Patient is experiencing chills and a runny nose.

## 2020-12-01 ENCOUNTER — Telehealth: Payer: Self-pay

## 2020-12-01 NOTE — Telephone Encounter (Signed)
Spoke with daughter yesterday about this message.  She said he was feeling better and only had those hard chills one time.  Advised on OTC medication to treat symptoms and to watch for worsening symptoms.  If worsens to let us know, if wants visit to let us know.  Daughter verbalized understanding and was agreeable with plan      Copied from [place] [phone]. Topic: Appointment Scheduling - Scheduling Inquiry for Clinic >> Nov 30, 2020  1:36 PM Erick Blinks wrote: Reason for CRM: Pt's daughter called and is requesting urgent advice from the office. Pt has tested positive, runny nose, has cold chills, fever 99.1 (has taken two 500 Mg tylenol tablets, no sore throat, doesn't feel really bad but does not feel normal) Pt's daughter has him in isolation.   Best contact: [phone]

## 2020-12-09 ENCOUNTER — Telehealth: Payer: Self-pay

## 2020-12-09 ENCOUNTER — Ambulatory Visit: Payer: Self-pay

## 2020-12-09 DIAGNOSIS — R059 Cough, unspecified: Secondary | ICD-10-CM

## 2020-12-09 MED ORDER — PREDNISONE 20 MG PO TABS: ORAL_TABLET | ORAL | 0 refills | Status: DC

## 2020-12-09 MED ORDER — DOXYCYCLINE HYCLATE 100 MG PO TABS: 100.0000 mg | ORAL_TABLET | Freq: Two times a day (BID) | ORAL | 0 refills | Status: DC

## 2020-12-09 NOTE — Telephone Encounter (Signed)
Patient's daughter called stating that the family has had COVID-54.  Dx with home test Jan 10.  She states she is worried about her father who is sleeping a lot.  She states he has no appetite. He has a congested cough. He is weak and his hands shake. He is out picking up medication at pharmacy now. She states he get SOB easily. He has not been seen by a health care provider since the COVID-19 positive result. Per protocol virtual appointment set for tomorrow Daughter request that you call her number [phone]. For the visit Reason for Disposition . [1] PERSISTING SYMPTOMS OF COVID-19 AND [2] symptoms WORSE  Answer Assessment - Initial Assessment Questions 1. COVID-19 ONSET: "When did the symptoms of COVID-19 first start?"     2weeks ago 2. DIAGNOSIS CONFIRMATION: "How were you diagnosed?" (e.g., COVID-19 oral or nasal viral test; COVID-19 antibody test; doctor visit)     Yes 2 weeks ago 3. MAIN SYMPTOM:  "What is your main concern or symptom right now?" (e.g., breathing difficulty, cough, fatigue. loss of smell)     No appitite and does not feel good weakness 4. SYMPTOM ONSET: "When did the COVID-19  start?"  cough not eating 5. BETTER-SAME-WORSE: "Are you getting better, staying the same, or getting worse over the last 1 to 2 weeks?"    Better sleeping alot 6. RECENT MEDICAL VISIT: "Have you been seen by a healthcare provider (doctor, NP, PA) for these persisting COVID-19 symptoms?" If Yes, ask: "When were you seen?" (e.g., date)     no 7. COUGH: "Do you have a cough?" If Yes, ask: "How bad is the cough?"     Yes with congestion 8. FEVER: "Do you have a fever?" If Yes, ask: "What is your temperature, how was it measured, and when did it start?"     None today 9. BREATHING DIFFICULTY: "Are you having any trouble breathing?" If Yes, ask: "How bad is your breathing?" (e.g., mild, moderate, severe)    - MILD: No SOB at rest, mild SOB with walking, speaks normally in sentences, can lie down,  no retractions, pulse < 100.    - MODERATE: SOB at rest, SOB with minimal exertion and prefers to sit, cannot lie down flat, speaks in phrases, mild retractions, audible wheezing, pulse 100-120.    - SEVERE: Very SOB at rest, speaks in single words, struggling to breathe, sitting hunched forward, retractions, pulse > 120       None while resting 10. HIGH RISK DISEASE: "Do you have any chronic medical problems?" (e.g., asthma, heart or lung disease, weak immune system, obesity, etc.)      Heart dx daibetes 11. VACCINE: "Have you gotten the COVID-19 vaccine?" If Yes ask: "Which one, how many shots, when did you get it?"       Yes pfizer 2 doses 12. PREGNANCY: "Is there any chance you are pregnant?" "When was your last menstrual period?"      N/A 13. OTHER SYMPTOMS: "Do you have any other symptoms?"  (e.g., fatigue, headache, muscle pain, weakness)       Fatigued, weakness, headache Sunday  Protocols used: CORONAVIRUS (COVID-19) PERSISTING SYMPTOMS FOLLOW-UP CALL-A-AH

## 2020-12-09 NOTE — Telephone Encounter (Signed)
Doxycycline 100 mg twice a day for 5 days.  Prednisone 20 mg daily for 5 days.

## 2020-12-09 NOTE — Telephone Encounter (Signed)
Patient still has cough, congestion, sinus pressure. No fevers. They are requesting something be sent into the pharmacy. Please advise. Thanks!

## 2020-12-09 NOTE — Telephone Encounter (Signed)
Patient advised as below.  

## 2020-12-09 NOTE — Telephone Encounter (Signed)
Copied from [place] [id number]. Topic: General - Other >> Dec 09, 2020 11:56 AM Keene Breath wrote: Reason for CRM: Daughter called to ask if the nurse or doctor could send in some medication for patient since testing positive for covid 10 days ago.  Stated that patient still has some congestion and is not eating or feeling well.  Would like medication and advice as to what the patient can do.  CB# (daughter Loletha Carrow) [phone]

## 2020-12-10 ENCOUNTER — Encounter: Payer: Self-pay | Admitting: Family Medicine

## 2020-12-15 ENCOUNTER — Other Ambulatory Visit: Payer: Self-pay | Admitting: Family Medicine

## 2020-12-18 NOTE — Telephone Encounter (Signed)
Closing encounter

## 2020-12-21 ENCOUNTER — Other Ambulatory Visit: Payer: Self-pay | Admitting: Cardiovascular Disease

## 2020-12-21 NOTE — Telephone Encounter (Signed)
Rx request sent to pharmacy.  

## 2021-01-05 ENCOUNTER — Encounter: Payer: Self-pay | Admitting: Family Medicine

## 2021-01-05 ENCOUNTER — Ambulatory Visit (INDEPENDENT_AMBULATORY_CARE_PROVIDER_SITE_OTHER): Payer: HMO | Admitting: Family Medicine

## 2021-01-05 ENCOUNTER — Other Ambulatory Visit: Payer: Self-pay

## 2021-01-05 VITALS — BP 183/79 | HR 66 | Temp 97.6°F | Resp 18 | Wt 219.0 lb

## 2021-01-05 DIAGNOSIS — Z951 Presence of aortocoronary bypass graft: Secondary | ICD-10-CM

## 2021-01-05 DIAGNOSIS — L989 Disorder of the skin and subcutaneous tissue, unspecified: Secondary | ICD-10-CM

## 2021-01-05 NOTE — Progress Notes (Signed)
I,April Miller,acting as a scribe for Wilhemena Durie, MD.,have documented all relevant documentation on the behalf of Wilhemena Durie, MD,as directed by  Wilhemena Durie, MD while in the presence of Wilhemena Durie, MD.   Established patient visit   Patient: Joseph [name]   DOB: 12-Oct-1925   87 y.o. Male  MRN: [id number] Visit Date: 01/05/2021  Today's healthcare provider: Wilhemena Durie, MD   Chief Complaint  Patient presents with  . Sore    Right foreaem   Subjective    HPI HPI    Sore     Additional comments: Right foreaem       Last edited by Julieta Bellini, CMA on 01/05/2021  4:03 PM. (History)      Patient is here concerning a sore spot on his right forearm. Patient states provider has seen spot before, however it has worsened over the past week.  We have frozen this area before successfully and has come. back.  There is mild erythema around the edges.  No drainage and no tenderness.  Has no other thick smaller lesion on the outer forearm. The patient's daughter lives with him.  Unfortunately his wife passed away 2 weeks ago after suffering a stroke. He is coping fairly well.       Medications: Outpatient Medications Prior to Visit  Medication Sig  . aspirin EC 81 MG tablet Take 1 tablet (81 mg total) by mouth daily.  Marland Kitchen atorvastatin (LIPITOR) 80 MG tablet Take 1 tablet (80 mg total) by mouth every evening.  . clobetasol cream (TEMOVATE) 1.82 % Apply 1 application topically 2 (two) times daily. As needed to hands and elbows  . Continuous Blood Gluc Sensor (FREESTYLE LIBRE 14 DAY SENSOR) MISC USE AS DIRECTED EVERY 14 DAYS  . hydrocortisone 2.5 % cream Apply 1 application topically 2 (two) times daily. As needed to face, crease, groin and butt  . insulin aspart (NOVOLOG) 100 UNIT/ML injection Inject into the skin 3 (three) times daily before meals. Inject 20 units in the morning / 15 units at 12:00pm / and 20 units at night time.  . insulin  glargine (LANTUS) 100 UNIT/ML injection Inject 65 Units into the skin daily.  Marland Kitchen ketoconazole (NIZORAL) 2 % cream Apply 1 application topically 2 (two) times daily as needed for irritation. To groin, fave and buttocks.  Marland Kitchen levothyroxine (SYNTHROID) 50 MCG tablet TAKE 1 TABLET BY MOUTH ONCE DAILY BEFORE BREAKFAST  . losartan (COZAAR) 100 MG tablet Take 1 tablet by mouth once daily  . Magnesium 250 MG TABS Take 250 mg by mouth as needed (swelling). Take when you take your Torsemide.  . mometasone (ELOCON) 0.1 % ointment Apply topically daily. As needed to scalp and ears  . nitroGLYCERIN (NITROSTAT) 0.4 MG SL tablet Place 1 tablet (0.4 mg total) under the tongue every 5 (five) minutes as needed for chest pain.  Marland Kitchen omeprazole (PRILOSEC) 20 MG capsule Take 1 capsule by mouth once daily  . potassium chloride (KLOR-CON) 10 MEQ tablet Take 1 tablet (10 meq) by mouth once a day on Mondays, Wednesdays, & Fridays (when taking torsemide)  . torsemide (DEMADEX) 20 MG tablet Take 1 tablet (20 mg) by mouth once a day on Mondays, Wednesdays, and Fridays  . Cholecalciferol 125 MCG (5000 UT) capsule Take 5,000 Units by mouth at bedtime. (Patient not taking: Reported on 01/05/2021)  . clopidogrel (PLAVIX) 75 MG tablet Take 1 tablet (75 mg total) by mouth daily. (Patient not  taking: Reported on 01/05/2021)  . doxycycline (VIBRA-TABS) 100 MG tablet Take 1 tablet (100 mg total) by mouth 2 (two) times daily. (Patient not taking: No sig reported)  . predniSONE (DELTASONE) 20 MG tablet Take 1 tablet daily for 5 days (Patient not taking: Reported on 01/05/2021)   No facility-administered medications prior to visit.    Review of Systems      Objective    BP (!) 183/79 (BP Location: Right Arm, Patient Position: Sitting, Cuff Size: Large)   Pulse 66   Temp 97.6 F (36.4 C) (Oral)   Resp 18   Wt 219 lb (99.3 kg)   SpO2 96%   BMI 34.30 kg/m  BP Readings from Last 3 Encounters:  01/05/21 (!) 183/79  10/21/20 (!) 183/77   10/01/20 (!) 160/76   Wt Readings from Last 3 Encounters:  01/05/21 219 lb (99.3 kg)  10/21/20 219 lb (99.3 kg)  10/01/20 219 lb (99.3 kg)       Physical Exam Vitals reviewed.  Constitutional:      Appearance: He is obese.  HENT:     Head: Normocephalic and atraumatic.     Right Ear: External ear normal.     Left Ear: External ear normal.  Eyes:     General: No scleral icterus. Cardiovascular:     Rate and Rhythm: Normal rate and regular rhythm.     Heart sounds: Normal heart sounds.  Pulmonary:     Breath sounds: Normal breath sounds.  Skin:    General: Skin is warm and dry.     Comments: Skin lesion on forearm with surrounding low erythema.  No drainage.  Is about 5 mm across.  Neurological:     General: No focal deficit present.     Mental Status: He is alert and oriented to person, place, and time.  Psychiatric:        Mood and Affect: Mood normal.        Behavior: Behavior normal.        Thought Content: Thought content normal.        Judgment: Judgment normal.       No results found for any visits on 01/05/21.  Assessment & Plan     1. Skin lesion of left arm With cryo pen.  Not a skin cancer. Already this well.  2. S/P CABG (coronary artery bypass graft) All risk factors treated.  3. Morbid obesity due to excess calories ([place])    No follow-ups on file.      I, Wilhemena Durie, MD, have reviewed all documentation for this visit. The documentation on 01/09/21 for the exam, diagnosis, procedures, and orders are all accurate and complete.    Ankita Newcomer Cranford Mon, MD  [facility] [phone] (phone) [phone] (fax)  [place]

## 2021-01-06 ENCOUNTER — Telehealth: Payer: Self-pay

## 2021-01-06 ENCOUNTER — Other Ambulatory Visit: Payer: Self-pay | Admitting: Family Medicine

## 2021-01-06 DIAGNOSIS — L409 Psoriasis, unspecified: Secondary | ICD-10-CM

## 2021-01-06 DIAGNOSIS — B372 Candidiasis of skin and nail: Secondary | ICD-10-CM

## 2021-01-06 NOTE — Telephone Encounter (Signed)
Please advise 

## 2021-01-06 NOTE — Telephone Encounter (Signed)
Pt. Reports the creams Dr. Rosanna Randy prescribed state to apply to groin,face and butt. But he thought he "was supposed to put it on my arms. I don't have any skin problems in those places." Please advise pt.

## 2021-01-07 NOTE — Telephone Encounter (Signed)
Nothing topical.  I just froze a couple spots

## 2021-01-07 NOTE — Telephone Encounter (Signed)
Patient was advised.  

## 2021-01-19 ENCOUNTER — Ambulatory Visit: Payer: Self-pay

## 2021-01-19 DIAGNOSIS — I5032 Chronic diastolic (congestive) heart failure: Secondary | ICD-10-CM

## 2021-01-19 DIAGNOSIS — Z794 Long term (current) use of insulin: Secondary | ICD-10-CM

## 2021-01-20 NOTE — Progress Notes (Signed)
Established patient visit   Patient: Joseph [name]   DOB: 05/16/1925   87 y.o. Male  MRN: [id number] Visit Date: 01/21/2021  Today's healthcare provider: Wilhemena Durie, MD   Chief Complaint  Patient presents with  . Hypertension  . Diabetes  . skin lesion   Subjective    HPI  Patient comes in today for follow-up for a skin lesion which is healing but the patient wishes to have it checked. He is mourning the death of his wife and is having trouble coping.  His daughter is no longer in the house but it daughter will be coming to stay with him in the next few weeks.  He has had no falls.  He takes his medications well.  His blood pressure and sugars are okay.  He states 1 week ago his blood pressure was 165/70 Hypertension, follow-up  BP Readings from Last 3 Encounters:  01/21/21 (!) 185/87  01/05/21 (!) 183/79  10/21/20 (!) 183/77   Wt Readings from Last 3 Encounters:  01/21/21 220 lb (99.8 kg)  01/05/21 219 lb (99.3 kg)  10/21/20 219 lb (99.3 kg)     He was last seen for hypertension 3 months ago.  BP at that visit was 183/79. Management since that visit includes; Pressure at home is 140/80 or less. He reports good compliance with treatment. He is not having side effects.  He is not exercising. He is adherent to low salt diet.   Outside blood pressures are checked daily.  He does not smoke.  Use of agents associated with hypertension: none.   Type 2 diabetes mellitus with diabetic peripheral angiopathy without gangrene, with long-term current use of insulin ([place]) From 10/21/2020-By endocrinology.  Lab Results  Component Value Date   HGBA1C 9.4 09/15/2020    Lumbar spondylosis From 10/21/2020-Pain stable.  Skin Lesion Patient was seen on 01/05/21 and was prescribed ketoconazole cream. He reports that it is still not healing.       Medications: Outpatient Medications Prior to Visit  Medication Sig  . aspirin EC 81 MG tablet Take 1 tablet  (81 mg total) by mouth daily.  Marland Kitchen atorvastatin (LIPITOR) 80 MG tablet Take 1 tablet (80 mg total) by mouth every evening.  . Cholecalciferol 125 MCG (5000 UT) capsule Take 5,000 Units by mouth at bedtime. (Patient not taking: Reported on 01/05/2021)  . clobetasol cream (TEMOVATE) 3.84 % Apply 1 application topically 2 (two) times daily. As needed to hands and elbows  . clopidogrel (PLAVIX) 75 MG tablet Take 1 tablet (75 mg total) by mouth daily. (Patient not taking: Reported on 01/05/2021)  . Continuous Blood Gluc Sensor (FREESTYLE LIBRE 14 DAY SENSOR) MISC USE AS DIRECTED EVERY 14 DAYS  . hydrocortisone 2.5 % cream APPLY TOPICALLY TWICE DAILY AS NEEDED TO FACE, CREASE, GROIN, AND BUTT  . insulin aspart (NOVOLOG) 100 UNIT/ML injection Inject into the skin 3 (three) times daily before meals. Inject 20 units in the morning / 15 units at 12:00pm / and 20 units at night time.  . insulin glargine (LANTUS) 100 UNIT/ML injection Inject 65 Units into the skin daily.  Marland Kitchen ketoconazole (NIZORAL) 2 % cream APPLY TOPICALLY TWICE DAILY AS NEEDED FOR IRRITATION TO GROIN, FACE, AND BUTTOCKS  . levothyroxine (SYNTHROID) 50 MCG tablet TAKE 1 TABLET BY MOUTH ONCE DAILY BEFORE BREAKFAST  . losartan (COZAAR) 100 MG tablet Take 1 tablet by mouth once daily  . Magnesium 250 MG TABS Take 250 mg by mouth as  needed (swelling). Take when you take your Torsemide.  . mometasone (ELOCON) 0.1 % ointment Apply topically daily. As needed to scalp and ears  . nitroGLYCERIN (NITROSTAT) 0.4 MG SL tablet Place 1 tablet (0.4 mg total) under the tongue every 5 (five) minutes as needed for chest pain.  Marland Kitchen omeprazole (PRILOSEC) 20 MG capsule Take 1 capsule by mouth once daily  . potassium chloride (KLOR-CON) 10 MEQ tablet Take 1 tablet (10 meq) by mouth once a day on Mondays, Wednesdays, & Fridays (when taking torsemide)  . torsemide (DEMADEX) 20 MG tablet Take 1 tablet (20 mg) by mouth once a day on Mondays, Wednesdays, and Fridays   No  facility-administered medications prior to visit.    Review of Systems  Constitutional: Negative for appetite change, chills and fever.  Respiratory: Negative for chest tightness, shortness of breath and wheezing.   Cardiovascular: Negative for chest pain and palpitations.  Gastrointestinal: Negative for abdominal pain, nausea and vomiting.        Objective    BP (!) 185/87   Pulse 97   Resp 16   Wt 220 lb (99.8 kg)   BMI 34.46 kg/m  BP Readings from Last 3 Encounters:  01/21/21 (!) 185/87  01/05/21 (!) 183/79  10/21/20 (!) 183/77   Wt Readings from Last 3 Encounters:  01/21/21 220 lb (99.8 kg)  01/05/21 219 lb (99.3 kg)  10/21/20 219 lb (99.3 kg)       Physical Exam Vitals reviewed.  Constitutional:      Appearance: He is obese.  HENT:     Head: Normocephalic and atraumatic.     Right Ear: External ear normal.     Left Ear: External ear normal.  Eyes:     General: No scleral icterus. Cardiovascular:     Rate and Rhythm: Normal rate and regular rhythm.     Heart sounds: Normal heart sounds.  Pulmonary:     Breath sounds: Normal breath sounds.  Skin:    General: Skin is warm and dry.     Comments: Skin lesion on forearm right is almost healed.  There is some remaining scarring no sign of secondary infection.  No break in the skin.  Neurological:     General: No focal deficit present.     Mental Status: He is alert and oriented to person, place, and time.  Psychiatric:        Mood and Affect: Mood normal.        Behavior: Behavior normal.        Thought Content: Thought content normal.        Judgment: Judgment normal.       Results for orders placed or performed in visit on 01/21/21  Hemoglobin A1c  Result Value Ref Range   Hemoglobin A1C 9.4     Assessment & Plan     1. Chronic bilateral low back pain without sciatica   2. Atherosclerosis of coronary artery bypass graft of native heart with stable angina pectoris ([place]) All risk factors  treated.  3. Type 2 diabetes mellitus with diabetic peripheral angiopathy without gangrene, with long-term current use of insulin ([facility]) A1C Was is 9.4today.  This was done before I went in the room and patient has follow-up with endocrinology as Dr. Elisabeth Cara in the near future.  4. S/P CABG (coronary artery bypass graft) In the 1990s.  Medical therapy.  5. Essential hypertension Controlled.   patient to check his blood pressure at home. Consider adding chlorthalidone to his  regimen. 6.  Skin lesion Right forearm appears to be healing nicely from cryotherapy.  No sign of infection.  This 87 year old expectantly.  No follow-ups on file.      I, Wilhemena Durie, MD, have reviewed all documentation for this visit. The documentation on 01/22/21 for the exam, diagnosis, procedures, and orders are all accurate and complete.    Cedra Villalon Cranford Mon, MD  [facility] [phone] (phone) [phone] (fax)  [place]

## 2021-01-20 NOTE — Chronic Care Management (AMB) (Signed)
Chronic Care Management   Follow Up Note    Name: Joseph [name] MRN: [id number] DOB: 1925-03-17  Primary Care Provider: [name]., MD Reason for referral : Chronic Care Management   Joseph [name] is a 87 y.o. year old male who is a primary care patient of [name]., MD. A routine telephonic outreach was conducted today.   Review of Mr. Joseph [name] status, including review of consultants reports, relevant labs and test results was conducted today. Collaboration with appropriate care team members was performed as part of the comprehensive evaluation and provision of chronic care management services.    SDOH (Social Determinants of Health) assessments performed: No     Outpatient Encounter Medications as of 01/19/2021  Medication Sig Note   hydrocortisone 2.5 % cream APPLY TOPICALLY TWICE DAILY AS NEEDED TO FACE, CREASE, GROIN, AND BUTT    ketoconazole (NIZORAL) 2 % cream APPLY TOPICALLY TWICE DAILY AS NEEDED FOR IRRITATION TO GROIN, FACE, AND BUTTOCKS    aspirin EC 81 MG tablet Take 1 tablet (81 mg total) by mouth daily.    atorvastatin (LIPITOR) 80 MG tablet Take 1 tablet (80 mg total) by mouth every evening.    Cholecalciferol 125 MCG (5000 UT) capsule Take 5,000 Units by mouth at bedtime. (Patient not taking: Reported on 01/05/2021)    clobetasol cream (TEMOVATE) 4.27 % Apply 1 application topically 2 (two) times daily. As needed to hands and elbows    clopidogrel (PLAVIX) 75 MG tablet Take 1 tablet (75 mg total) by mouth daily. (Patient not taking: Reported on 01/05/2021)    Continuous Blood Gluc Sensor (FREESTYLE LIBRE 14 DAY SENSOR) MISC USE AS DIRECTED EVERY 14 DAYS    insulin aspart (NOVOLOG) 100 UNIT/ML injection Inject into the skin 3 (three) times daily before meals. Inject 20 units in the morning / 15 units at 12:00pm / and 20 units at night time.    insulin glargine (LANTUS) 100 UNIT/ML injection Inject 65 Units into the skin daily.     levothyroxine (SYNTHROID) 50 MCG tablet TAKE 1 TABLET BY MOUTH ONCE DAILY BEFORE BREAKFAST    losartan (COZAAR) 100 MG tablet Take 1 tablet by mouth once daily    Magnesium 250 MG TABS Take 250 mg by mouth as needed (swelling). Take when you take your Torsemide.    mometasone (ELOCON) 0.1 % ointment Apply topically daily. As needed to scalp and ears    nitroGLYCERIN (NITROSTAT) 0.4 MG SL tablet Place 1 tablet (0.4 mg total) under the tongue every 5 (five) minutes as needed for chest pain.    omeprazole (PRILOSEC) 20 MG capsule Take 1 capsule by mouth once daily    potassium chloride (KLOR-CON) 10 MEQ tablet Take 1 tablet (10 meq) by mouth once a day on Mondays, Wednesdays, & Fridays (when taking torsemide)    torsemide (DEMADEX) 20 MG tablet Take 1 tablet (20 mg) by mouth once a day on Mondays, Wednesdays, and Fridays 11/05/2020: Reports taking as needed.   No facility-administered encounter medications on file as of 01/19/2021.     Objective:  Goals Addressed            This Visit's Progress    Chronic Disease Management       CARE PLAN ENTRY (see longitudinal plan of care for additional care plan information)  Current Barriers:   Chronic Disease Management support and education needs Diabetes, Hypertension, Hyperlipidemia and CHF.  Case Manager Clinical Goal(s):  Over the next 120 days, patient  will:  Not require hospitalization or emergent care d/t complications r/t chronic illnesses.-Complete  Attend all scheduled medical appointments-Complete  Take all medications a prescribed  Monitor blood glucose levels and report lows less than 70 mg/dl.  Routinely monitor BP and record readings.-Complete  Adhere to recommended cardiac prudent/diabetic diet.  Follow recommended safety measures to prevent falls and injuries.-Complete   Interventions:   Inter-disciplinary care team collaboration (see longitudinal plan of care)  Discussed medications and blood glucose  readings. Reports taking medications as prescribed and checking his continuous monitoring device to review blood glucose readings. He reports that his blood glucose readings have been "average" but does note a reading in the 170's. He denies s/sx of hypoglycemia or hyperglycemia over the past few weeks. He does note a slight decrease in his appetite and attributes it to dealing with the loss of his wife last month. During our previous outreach he noted several low readings. He denies recent lows despite slightly decreases intake. Reports tolerating meals without difficulty. Does not require nutritional supplements.   Reviewed plan for CHF management. Reports monitoring for symptoms as advised. Denies chest pain. Notes minimal edema to his ankles. Denies abdominal distention, shortness of breath at rest or decrease in activity tolerance. He verbalized awareness of worsening s/sx that require immediate medical attention.   Discussed care management plan. [name] remains independent and has met his care management goals. He lost his spouse within the last month. We discussed options for grief/bereavement counseling. He reports coping well and denied need for counseling. Reports his family remains very supportive and readily available when needed. Overall feels that he is doing well however he does note continued discomfort to his hip. States this occurs mostly at night. Denies falls. Also notes that he needs to follow up for a small skin lesion. Reports discussing both concerns with Dr. Rosanna Randy recently but needs to schedule a follow up. Appointment scheduled for 01/21/21. He denies urgent concerns or changes in care management needs. Strongly encouraged to consider grief counseling and call if assistance/care coordination is needed. He agreed to call if additional outreach is needed.   Patient Self Care Activities:   Self administers medications as prescribed  Attends scheduled provider  appointments  Calls pharmacy for medication refills  Performs ADL's independently  Performs IADL's independently.   Please see past updates related to this goal by clicking on the "Past Updates" button in the selected goal          PLAN [name] will follow up in the clinic on 01/21/21. He agreed to call if additional outreach/care coordination is needed. The care management team will gladly assist.     Cristy [facility] [phone]

## 2021-01-21 ENCOUNTER — Encounter: Payer: Self-pay | Admitting: Family Medicine

## 2021-01-21 ENCOUNTER — Other Ambulatory Visit: Payer: Self-pay

## 2021-01-21 ENCOUNTER — Ambulatory Visit (INDEPENDENT_AMBULATORY_CARE_PROVIDER_SITE_OTHER): Payer: HMO | Admitting: Family Medicine

## 2021-01-21 VITALS — BP 185/87 | HR 97 | Resp 16 | Wt 220.0 lb

## 2021-01-21 DIAGNOSIS — Z794 Long term (current) use of insulin: Secondary | ICD-10-CM | POA: Diagnosis not present

## 2021-01-21 DIAGNOSIS — M545 Low back pain, unspecified: Secondary | ICD-10-CM

## 2021-01-21 DIAGNOSIS — G8929 Other chronic pain: Secondary | ICD-10-CM | POA: Diagnosis not present

## 2021-01-21 DIAGNOSIS — E1151 Type 2 diabetes mellitus with diabetic peripheral angiopathy without gangrene: Secondary | ICD-10-CM

## 2021-01-21 DIAGNOSIS — Z951 Presence of aortocoronary bypass graft: Secondary | ICD-10-CM | POA: Diagnosis not present

## 2021-01-21 DIAGNOSIS — I25708 Atherosclerosis of coronary artery bypass graft(s), unspecified, with other forms of angina pectoris: Secondary | ICD-10-CM

## 2021-01-21 DIAGNOSIS — I10 Essential (primary) hypertension: Secondary | ICD-10-CM

## 2021-01-21 DIAGNOSIS — E1159 Type 2 diabetes mellitus with other circulatory complications: Secondary | ICD-10-CM | POA: Diagnosis not present

## 2021-01-21 NOTE — Progress Notes (Signed)
Subjective:   Joseph [name] is a 87 y.o. male who presents for Medicare Annual/Subsequent preventive examination.  I connected with Joseph [name] today by telephone and verified that I am speaking with the correct person using two identifiers. Location patient: home Location provider: work Persons participating in the virtual visit: patient, provider.   I discussed the limitations, risks, security and privacy concerns of performing an evaluation and management service by telephone and the availability of in person appointments. I also discussed with the patient that there may be a patient responsible charge related to this service. The patient expressed understanding and verbally consented to this telephonic visit.    Interactive audio and video telecommunications were attempted between this provider and patient, however failed, due to patient having technical difficulties OR patient did not have access to video capability.  We continued and completed visit with audio only.   Review of Systems    N/A  Cardiac Risk Factors include: advanced age (>91mn, >>75women);male gender;hypertension;obesity (BMI >30kg/m2)     Objective:    There were no vitals filed for this visit. There is no height or weight on file to calculate BMI.  Advanced Directives 01/25/2021 02/13/2020 12/16/2019 12/16/2019 01/29/2019 01/08/2018 08/08/2017  Does Patient Have a Medical Advance Directive? _0  No Yes  Type of [place]amed[place]of [place] will [place] will Living will Living will [place] will - Living will  Does patient want to make changes to medical advance directive? - - No - Patient declined No - Patient declined - No - Patient declined -  Copy of [place] Chart? No - copy requested No - copy requested - - No - copy requested - -  Would patient like information on creating a medical  advance directive? - - - - - - -    Current Medications (verified) Outpatient Encounter Medications as of 01/25/2021  Medication Sig  . aspirin EC 81 MG tablet Take 1 tablet (81 mg total) by mouth daily.  .Marland Kitchenatorvastatin (LIPITOR) 80 MG tablet Take 1 tablet (80 mg total) by mouth every evening.  . clobetasol cream (TEMOVATE) 02.87% Apply 1 application topically 2 (two) times daily. As needed to hands and elbows  . Continuous Blood Gluc Sensor (FREESTYLE LIBRE 14 DAY SENSOR) MISC USE AS DIRECTED EVERY 14 DAYS  . hydrocortisone 2.5 % cream APPLY TOPICALLY TWICE DAILY AS NEEDED TO FACE, CREASE, GROIN, AND BUTT  . insulin aspart (NOVOLOG) 100 UNIT/ML injection Inject into the skin 3 (three) times daily before meals. Inject 20 units in the morning / 15 units at 12:00pm / and 20 units at night time.  . insulin glargine (LANTUS) 100 UNIT/ML injection Inject 65 Units into the skin daily.  .Marland Kitchenketoconazole (NIZORAL) 2 % cream APPLY TOPICALLY TWICE DAILY AS NEEDED FOR IRRITATION TO GROIN, FACE, AND BUTTOCKS  . levothyroxine (SYNTHROID) 50 MCG tablet TAKE 1 TABLET BY MOUTH ONCE DAILY BEFORE BREAKFAST  . losartan (COZAAR) 100 MG tablet Take 1 tablet by mouth once daily  . Magnesium 250 MG TABS Take 250 mg by mouth as needed (swelling). Take when you take your Torsemide.  . mometasone (ELOCON) 0.1 % ointment Apply topically daily. As needed to scalp and ears  . nitroGLYCERIN (NITROSTAT) 0.4 MG SL tablet Place 1 tablet (0.4 mg total) under the tongue every 5 (five) minutes as needed for chest pain.  .Marland Kitchenomeprazole (PRILOSEC) 20 MG capsule Take  1 capsule by mouth once daily  . potassium chloride (KLOR-CON) 10 MEQ tablet Take 1 tablet (10 meq) by mouth once a day on Mondays, Wednesdays, & Fridays (when taking torsemide)  . torsemide (DEMADEX) 20 MG tablet Take 1 tablet (20 mg) by mouth once a day on Mondays, Wednesdays, and Fridays  . Cholecalciferol 125 MCG (5000 UT) capsule Take 5,000 Units by mouth at bedtime.  (Patient not taking: No sig reported)  . clopidogrel (PLAVIX) 75 MG tablet Take 1 tablet (75 mg total) by mouth daily. (Patient not taking: No sig reported)   No facility-administered encounter medications on file as of 01/25/2021.    Allergies (verified) Patient has no known allergies.   History: Past Medical History:  Diagnosis Date  . CAD (coronary artery disease)    a. s/p CABG;  b. 07/2012 Cath: 3VD including 80 dLCX (med Rx), 4/4 patent grafts.  . Chronic diastolic CHF (congestive heart failure) ([place])    a. 07/2012 Echo: EF 55-60%, no rwma, Gr 1 DD, mild MR;  04/2014 Echo: EF 55-60%, no rwma, mild MR, mildly dil LA.  . Diabetes mellitus, type 2 ([place])   . HTN (hypertension)   . Hyperlipidemia   . Hypothyroidism   . Lower extremity edema    a. 03/2014 amlodipine d/c'd.  Marland Kitchen Spinal stenosis    Past Surgical History:  Procedure Laterality Date  . APPENDECTOMY    . CARDIAC CATHETERIZATION  07/2012   [facility]/no stents  . CHOLECYSTECTOMY    . CORNEAL TRANSPLANT    . CORONARY ARTERY BYPASS GRAFT  1994  . HEMORRHOID SURGERY    . LEFT HEART CATH AND CORS/GRAFTS ANGIOGRAPHY N/A 12/17/2019   Procedure: LEFT HEART CATH AND CORS/GRAFTS ANGIOGRAPHY;  Surgeon: Nelva Bush, MD;  Location: [facility];  Service: Cardiovascular;  Laterality: N/A;  . PROSTATE BIOPSY    . TOE SURGERY     ingrown toe nail   Family History  Problem Relation Age of Onset  . Heart failure Mother   . Heart attack Father   . Stroke Brother   . Heart failure Maternal Uncle   . Heart failure Maternal Grandfather   . Sleep apnea Daughter   . Sleep apnea Son   . Kidney Stones Son    Social History   Socioeconomic History  . Marital status: Widowed    Spouse name: Not on file  . Number of children: 3  . Years of education: Not on file  . Highest education level: Some college, no degree  Occupational History  . Occupation: retired  Tobacco Use  . Smoking status: Former Smoker    Packs/day: 0.00     Years: 6.00    Pack years: 0.00    Types: Pipe, Cigars    Quit date: 12/23/1980    Years since quitting: 40.1  . Smokeless tobacco: Never Used  Vaping Use  . Vaping Use: Never used  Substance and Sexual Activity  . Alcohol use: No  . Drug use: No  . Sexual activity: Never  Other Topics Concern  . Not on file  Social History Narrative   Retired.    Social Determinants of Health   Financial Resource Strain: Medium Risk  . Difficulty of Paying Living Expenses: Somewhat hard  Food Insecurity: No Food Insecurity  . Worried About Charity fundraiser in the Last Year: Never true  . Ran Out of Food in the Last Year: Never true  Transportation Needs: No Transportation Needs  . Lack of  Transportation (Medical): No  . Lack of Transportation (Non-Medical): No  Physical Activity: Inactive  . Days of Exercise per Week: 0 days  . Minutes of Exercise per Session: 0 min  Stress: No Stress Concern Present  . Feeling of Stress : Not at all  Social Connections: Socially Integrated  . Frequency of Communication with Friends and Family: More than three times a week  . Frequency of Social Gatherings with Friends and Family: More than three times a week  . Attends Religious Services: More than 4 times per year  . Active Member of Clubs or Organizations: Yes  . Attends Archivist Meetings: More than 4 times per year  . Marital Status: Married    Tobacco Counseling Counseling given: Not Answered   Clinical Intake:  Pre-visit preparation completed: Yes  Pain : No/denies pain     Nutritional Risks: None Diabetes: Yes  How often do you need to have someone help you when you read instructions, pamphlets, or other written materials from your doctor or pharmacy?: 1 - Never  Diabetic? Yes  Nutrition Risk Assessment:  Has the patient had any N/V/D within the last 2 months?  No  Does the patient have any non-healing wounds?  No  Has the patient had any unintentional weight loss  or weight gain?  No   Diabetes:  Is the patient diabetic?  Yes  If diabetic, was a CBG obtained today?  No  Did the patient bring in their glucometer from home?  No  How often do you monitor your CBG's? Four times a day or more.   Financial Strains and Diabetes Management:  Are you having any financial strains with the device, your supplies or your medication? No .  Does the patient want to be seen by Chronic Care Management for management of their diabetes?  No  Would the patient like to be referred to a Nutritionist or for Diabetic Management?  No   Diabetic Exams:  Diabetic Eye Exam: Overdue for diabetic eye exam. Pt has been advised about the importance in completing this exam. Patient advised to call and schedule an eye exam. Diabetic Foot Exam: Completed 09/22/20   Interpreter Needed?: No  Information entered by :: [facility], LPN   Activities of Daily Living In your present state of health, do you have any difficulty performing the following activities: 01/25/2021 02/13/2020  Hearing? Y N  Comment Does not wear hearing aids. -  Vision? N N  Difficulty concentrating or making decisions? N N  Walking or climbing stairs? N N  Dressing or bathing? N N  Doing errands, shopping? N N  Preparing Food and eating ? N N  Using the Toilet? N N  In the past six months, have you accidently leaked urine? N N  Do you have problems with loss of bowel control? N N  Managing your Medications? N N  Managing your Finances? N N  Housekeeping or managing your Housekeeping? N N  Some recent data might be hidden    Patient Care Team: [name]., MD as PCP - General (Unknown Physician Specialty) Minna Merritts, MD as PCP - Cardiology (Cardiology) Gabriel Carina Betsey Holiday, MD as Physician Assistant (Endocrinology) Marsh Dolly, MD as Referring Physician (Internal Medicine)  Indicate any recent Medical Services you may have received from other than [facility] providers in the past year (date  may be approximate).     Assessment:   This is a routine wellness examination for Elijah.  Hearing/Vision screen  No exam data present  Dietary issues and exercise activities discussed: Current Exercise Habits: The patient does not participate in regular exercise at present, Exercise limited by: orthopedic condition(s)  Goals    . Chronic Disease Management     CARE PLAN ENTRY (see longitudinal plan of care for additional care plan information)  Current Barriers:  . Chronic Disease Management support and education needs Diabetes, Hypertension, Hyperlipidemia and CHF.  Case Manager Clinical Goal(s):  Over the next 120 days, patient will: . Not require hospitalization or emergent care d/t complications r/t chronic illnesses.-Complete . Attend all scheduled medical appointments-Complete . Take all medications a prescribed . Monitor blood glucose levels and report lows less than 70 mg/dl. . Routinely monitor BP and record readings.-Complete . Adhere to recommended cardiac prudent/diabetic diet. . Follow recommended safety measures to prevent falls and injuries.-Complete   Interventions:  . Inter-disciplinary care team collaboration (see longitudinal plan of care) . Discussed medications and blood glucose readings. Reports taking medications as prescribed and checking his continuous monitoring device to review blood glucose readings. He reports that his blood glucose readings have been "average" but does note a reading in the 170's. He denies s/sx of hypoglycemia or hyperglycemia over the past few weeks. He does note a slight decrease in his appetite and attributes it to dealing with the loss of his wife last month. During our previous outreach he noted several low readings. He denies recent lows despite slightly decreases intake. Reports tolerating meals without difficulty. Does not require nutritional supplements.  . Reviewed plan for CHF management. Reports monitoring for symptoms as  advised. Denies chest pain. Notes minimal edema to his ankles. Denies abdominal distention, shortness of breath at rest or decrease in activity tolerance. He verbalized awareness of worsening s/sx that require immediate medical attention.  . Discussed care management plan. Mr. Reppond remains independent and has met his care management goals. He lost his spouse within the last month. We discussed options for grief/bereavement counseling. He reports coping well and denied need for counseling. Reports his family remains very supportive and readily available when needed. Overall feels that he is doing well however he does note continued discomfort to his hip. States this occurs mostly at night. Denies falls. Also notes that he needs to follow up for a small spot to his lower extremity. Reports discussing both concerns with Dr. Rosanna Randy recently but needs to schedule a follow up. Appointment scheduled for 01/21/21. He denies urgent concerns or changes in care management needs. Strongly encouraged to consider grief counseling and call if assistance/care coordination is needed. He agreed to call if additional outreach is needed.   Patient Self Care Activities:  . Self administers medications as prescribed . Attends scheduled provider appointments . Calls pharmacy for medication refills . Performs ADL's independently . Performs IADL's independently.   Please see past updates related to this goal by clicking on the "Past Updates" button in the selected goal      . Increase water intake     Recommend to increase water intake to 6-8 8 oz glasses a day.      Depression Screen PHQ 2/9 Scores 01/25/2021 05/21/2020 02/13/2020 01/29/2019 01/29/2019 01/08/2018 08/08/2017  PHQ - 2 Score 1 0 0 0 0 0 0  PHQ- 9 Score - - - 0 - - 0    Fall Risk Fall Risk  01/25/2021 05/05/2020 02/13/2020 01/29/2019 01/08/2018  Falls in the past year? 0 0 0 0 No  Number falls in past yr: 0 -  0 - -  Injury with Fall? 0 - 0 - -  Risk for fall  due to : - Medication side effect - - -  Follow up - Falls prevention discussed - - -    FALL RISK PREVENTION PERTAINING TO THE HOME:  Any stairs in or around the home? Yes  If so, are there any without handrails? No  Home free of loose throw rugs in walkways, pet beds, electrical cords, etc? Yes  Adequate lighting in your home to reduce risk of falls? Yes   ASSISTIVE DEVICES UTILIZED TO PREVENT FALLS:  Life alert? Yes  Use of a cane, walker or w/c? No  Grab bars in the bathroom? Yes  Shower chair or bench in shower? Yes  Elevated toilet seat or a handicapped toilet? Yes     Cognitive Function: Normal cognitive status assessed by direct observation by this Nurse Health Advisor. No abnormalities found.      6CIT Screen 01/29/2019 01/08/2018 12/23/2016  What Year? 0 points 0 points 0 points  What month? 0 points 0 points 0 points  What time? 0 points 0 points 0 points  Count back from 20 0 points 0 points 0 points  Months in reverse 0 points 2 points 0 points  Repeat phrase 0 points 0 points 2 points  Total Score 0 2 2    Immunizations Immunization History  Administered Date(s) Administered  . Fluad Quad(high Dose 65+) 11/07/2019  . Influenza Split 08/23/2011  . Influenza, High Dose Seasonal PF 09/01/2015, 08/08/2017  . Influenza,inj,Quad PF,6+ Mos 08/05/2013, 08/04/2014, 08/18/2016  . PFIZER(Purple Top)SARS-COV-2 Vaccination 02/24/2020, 03/18/2020  . Pneumococcal Conjugate-13 03/24/2015  . Pneumococcal Polysaccharide-23 10/18/2014  . Tdap 08/04/2014  . Zoster 08/04/2014    TDAP status: Up to date  Flu Vaccine status: Declined, Education has been provided regarding the importance of this vaccine but patient still declined. Advised may receive this vaccine at local pharmacy or Health Dept. Aware to provide a copy of the vaccination record if obtained from local pharmacy or Health Dept. Verbalized acceptance and understanding.  Pneumococcal vaccine status: Up to  date  Covid-19 vaccine status: Completed vaccines  Qualifies for Shingles Vaccine? Yes   Zostavax completed Yes   Shingrix Completed?: No.    Education has been provided regarding the importance of this vaccine. Patient has been advised to call insurance company to determine out of pocket expense if they have not yet received this vaccine. Advised may also receive vaccine at local pharmacy or Health Dept. Verbalized acceptance and understanding.  Screening Tests Health Maintenance  Topic Date Due  . OPHTHALMOLOGY EXAM  11/17/2020  . COVID-19 Vaccine (3 - Pfizer risk 4-dose series) 02/10/2021 (Originally 04/15/2020)  . INFLUENZA VACCINE  02/18/2021 (Originally 06/21/2020)  . HEMOGLOBIN A1C  03/16/2021  . FOOT EXAM  09/22/2021  . TETANUS/TDAP  08/04/2024  . PNA vac Low Risk Adult  Completed  . HPV VACCINES  Aged Out    Health Maintenance  Health Maintenance Due  Topic Date Due  . OPHTHALMOLOGY EXAM  11/17/2020    Colorectal cancer screening: No longer required.   Lung Cancer Screening: (Low Dose CT Chest recommended if Age 63-80 years, 30 pack-year currently smoking OR have quit w/in 15years.) does not qualify.   Additional Screening:  Vision Screening: Recommended annual ophthalmology exams for early detection of glaucoma and other disorders of the eye. Is the patient up to date with their annual eye exam?  Yes  Who is the provider or what is  the name of the office in which the patient attends annual eye exams? [facility] in [place] If pt is not established with a provider, would they like to be referred to a provider to establish care? No .   Dental Screening: Recommended annual dental exams for proper oral hygiene  Community Resource Referral / Chronic Care Management: CRR required this visit?  No   CCM required this visit?  No      Plan:     I have personally reviewed and noted the following in the patient's chart:   . Medical and social history . Use of alcohol,  tobacco or illicit drugs  . Current medications and supplements . Functional ability and status . Nutritional status . Physical activity . Advanced directives . List of other physicians . Hospitalizations, surgeries, and ER visits in previous 12 months . Vitals . Screenings to include cognitive, depression, and falls . Referrals and appointments  In addition, I have reviewed and discussed with patient certain preventive protocols, quality metrics, and best practice recommendations. A written personalized care plan for preventive services as well as general preventive health recommendations were provided to patient.     Tiffney Haughton [place], [place]   02/26/9979   Nurse Notes: Pt plans to schedule an eye exam this year with the [facility]. Requested records once completed. Pt declined receiving a Covid booster.

## 2021-01-25 ENCOUNTER — Other Ambulatory Visit: Payer: Self-pay

## 2021-01-25 ENCOUNTER — Ambulatory Visit (INDEPENDENT_AMBULATORY_CARE_PROVIDER_SITE_OTHER): Payer: HMO

## 2021-01-25 DIAGNOSIS — Z0000 Encounter for general adult medical examination without abnormal findings: Secondary | ICD-10-CM

## 2021-01-25 NOTE — Patient Instructions (Signed)
Mr. Joseph [name] , Thank you for taking time to come for your Medicare Wellness Visit. I appreciate your ongoing commitment to your health goals. Please review the following plan we discussed and let me know if I can assist you in the future.   Screening recommendations/referrals: Colonoscopy: No longer required.  Recommended yearly ophthalmology/optometry visit for glaucoma screening and checkup Recommended yearly dental visit for hygiene and checkup  Vaccinations: Influenza vaccine: Currently due, declined receiving this year.  Pneumococcal vaccine: Completed series Tdap vaccine: Up to date, due 07/2024 Shingles vaccine: Shingrix discussed. Please contact your pharmacy for coverage information.     Advanced directives: Please bring a copy of your POA (Power of Attorney) and/or Living Will to your next appointment.   Conditions/risks identified: Recommend to increase water intake to 6-8 8 oz glasses a day.  Next appointment: 03/23/21 @ 3:00 PM with Dr Rosanna Randy. Declined scheduling an AWV for 2023 at this time.   Preventive Care 81 Years and Older, Male Preventive care refers to lifestyle choices and visits with your health care provider that can promote health and wellness. What does preventive care include?  A yearly physical exam. This is also called an annual well check.  Dental exams once or twice a year.  Routine eye exams. Ask your health care provider how often you should have your eyes checked.  Personal lifestyle choices, including:  Daily care of your teeth and gums.  Regular physical activity.  Eating a healthy diet.  Avoiding tobacco and drug use.  Limiting alcohol use.  Practicing safe sex.  Taking low doses of aspirin every day.  Taking vitamin and mineral supplements as recommended by your health care provider. What happens during an annual well check? The services and screenings done by your health care provider during your annual well check will depend on  your age, overall health, lifestyle risk factors, and family history of disease. Counseling  Your health care provider may ask you questions about your:  Alcohol use.  Tobacco use.  Drug use.  Emotional well-being.  Home and relationship well-being.  Sexual activity.  Eating habits.  History of falls.  Memory and ability to understand (cognition).  Work and work Statistician. Screening  You may have the following tests or measurements:  Height, weight, and BMI.  Blood pressure.  Lipid and cholesterol levels. These may be checked every 5 years, or more frequently if you are over 57 years old.  Skin check.  Lung cancer screening. You may have this screening every year starting at age 49 if you have a 30-pack-year history of smoking and currently smoke or have quit within the past 15 years.  Fecal occult blood test (FOBT) of the stool. You may have this test every year starting at age 73.  Flexible sigmoidoscopy or colonoscopy. You may have a sigmoidoscopy every 5 years or a colonoscopy every 10 years starting at age 80.  Prostate cancer screening. Recommendations will vary depending on your family history and other risks.  Hepatitis C blood test.  Hepatitis B blood test.  Sexually transmitted disease (STD) testing.  Diabetes screening. This is done by checking your blood sugar (glucose) after you have not eaten for a while (fasting). You may have this done every 1-3 years.  Abdominal aortic aneurysm (AAA) screening. You may need this if you are a current or former smoker.  Osteoporosis. You may be screened starting at age 91 if you are at high risk. Talk with your health care provider about your test  results, treatment options, and if necessary, the need for more tests. Vaccines  Your health care provider may recommend certain vaccines, such as:  Influenza vaccine. This is recommended every year.  Tetanus, diphtheria, and acellular pertussis (Tdap, Td) vaccine.  You may need a Td booster every 10 years.  Zoster vaccine. You may need this after age 57.  Pneumococcal 13-valent conjugate (PCV13) vaccine. One dose is recommended after age 37.  Pneumococcal polysaccharide (PPSV23) vaccine. One dose is recommended after age 30. Talk to your health care provider about which screenings and vaccines you need and how often you need them. This information is not intended to replace advice given to you by your health care provider. Make sure you discuss any questions you have with your health care provider. Document Released: 12/04/2015 Document Revised: 07/27/2016 Document Reviewed: 09/08/2015 Elsevier Interactive Patient Education  2017 [place] Prevention in the Home Falls can cause injuries. They can happen to people of all ages. There are many things you can do to make your home safe and to help prevent falls. What can I do on the outside of my home?  Regularly fix the edges of walkways and driveways and fix any cracks.  Remove anything that might make you trip as you walk through a door, such as a raised step or threshold.  Trim any bushes or trees on the path to your home.  Use bright outdoor lighting.  Clear any walking paths of anything that might make someone trip, such as rocks or tools.  Regularly check to see if handrails are loose or broken. Make sure that both sides of any steps have handrails.  Any raised decks and porches should have guardrails on the edges.  Have any leaves, snow, or ice cleared regularly.  Use sand or salt on walking paths during winter.  Clean up any spills in your garage right away. This includes oil or grease spills. What can I do in the bathroom?  Use night lights.  Install grab bars by the toilet and in the tub and shower. Do not use towel bars as grab bars.  Use non-skid mats or decals in the tub or shower.  If you need to sit down in the shower, use a plastic, non-slip stool.  Keep the  floor dry. Clean up any water that spills on the floor as soon as it happens.  Remove soap buildup in the tub or shower regularly.  Attach bath mats securely with double-sided non-slip rug tape.  Do not have throw rugs and other things on the floor that can make you trip. What can I do in the bedroom?  Use night lights.  Make sure that you have a light by your bed that is easy to reach.  Do not use any sheets or blankets that are too big for your bed. They should not hang down onto the floor.  Have a firm chair that has side arms. You can use this for support while you get dressed.  Do not have throw rugs and other things on the floor that can make you trip. What can I do in the kitchen?  Clean up any spills right away.  Avoid walking on wet floors.  Keep items that you use a lot in easy-to-reach places.  If you need to reach something above you, use a strong step stool that has a grab bar.  Keep electrical cords out of the way.  Do not use floor polish or wax that makes  floors slippery. If you must use wax, use non-skid floor wax.  Do not have throw rugs and other things on the floor that can make you trip. What can I do with my stairs?  Do not leave any items on the stairs.  Make sure that there are handrails on both sides of the stairs and use them. Fix handrails that are broken or loose. Make sure that handrails are as long as the stairways.  Check any carpeting to make sure that it is firmly attached to the stairs. Fix any carpet that is loose or worn.  Avoid having throw rugs at the top or bottom of the stairs. If you do have throw rugs, attach them to the floor with carpet tape.  Make sure that you have a light switch at the top of the stairs and the bottom of the stairs. If you do not have them, ask someone to add them for you. What else can I do to help prevent falls?  Wear shoes that:  Do not have high heels.  Have rubber bottoms.  Are comfortable and fit  you well.  Are closed at the toe. Do not wear sandals.  If you use a stepladder:  Make sure that it is fully opened. Do not climb a closed stepladder.  Make sure that both sides of the stepladder are locked into place.  Ask someone to hold it for you, if possible.  Clearly mark and make sure that you can see:  Any grab bars or handrails.  First and last steps.  Where the edge of each step is.  Use tools that help you move around (mobility aids) if they are needed. These include:  Canes.  Walkers.  Scooters.  Crutches.  Turn on the lights when you go into a dark area. Replace any light bulbs as soon as they burn out.  Set up your furniture so you have a clear path. Avoid moving your furniture around.  If any of your floors are uneven, fix them.  If there are any pets around you, be aware of where they are.  Review your medicines with your doctor. Some medicines can make you feel dizzy. This can increase your chance of falling. Ask your doctor what other things that you can do to help prevent falls. This information is not intended to replace advice given to you by your health care provider. Make sure you discuss any questions you have with your health care provider. Document Released: 09/03/2009 Document Revised: 04/14/2016 Document Reviewed: 12/12/2014 Elsevier Interactive Patient Education  2017 [employer].

## 2021-01-25 NOTE — Patient Instructions (Addendum)
  Thank you for allowing the Chronic Care Management team to participate in your care.     Goals Addressed            This Visit's Progress   . Chronic Disease Management       CARE PLAN ENTRY (see longitudinal plan of care for additional care plan information)  Current Barriers:  . Chronic Disease Management support and education needs Diabetes, Hypertension, Hyperlipidemia and CHF.  Case Manager Clinical Goal(s):  Over the next 120 days, patient will: . Not require hospitalization or emergent care d/t complications r/t chronic illnesses.-Complete . Attend all scheduled medical appointments-Complete . Take all medications a prescribed . Monitor blood glucose levels and report lows less than 70 mg/dl. . Routinely monitor BP and record readings.-Complete . Adhere to recommended cardiac prudent/diabetic diet. . Follow recommended safety measures to prevent falls and injuries.-Complete   Interventions:  . Inter-disciplinary care team collaboration (see longitudinal plan of care) . Discussed medications and blood glucose readings. Reports taking medications as prescribed and checking his continuous monitoring device to review blood glucose readings. He reports that his blood glucose readings have been "average" but does note a reading in the 170's. He denies s/sx of hypoglycemia or hyperglycemia over the past few weeks. He does note a slight decrease in his appetite and attributes it to dealing with the loss of his wife last month. During our previous outreach he noted several low readings. He denies recent lows despite slightly decreases intake. Reports tolerating meals without difficulty. Does not require nutritional supplements.  . Reviewed plan for CHF management. Reports monitoring for symptoms as advised. Denies chest pain. Notes minimal edema to his ankles. Denies abdominal distention, shortness of breath at rest or decrease in activity tolerance. He verbalized awareness of  worsening s/sx that require immediate medical attention.  . Discussed care management plan. Mr. Stabenow remains independent and has met his care management goals. He lost his spouse within the last month. We discussed options for grief/bereavement counseling. He reports coping well and denied need for counseling. Reports his family remains very supportive and readily available when needed. Overall feels that he is doing well however he does note continued discomfort to his hip. States this occurs mostly at night. Denies falls. Also notes that he needs to follow up for a skin lesion. Reports discussing both concerns with Dr. Rosanna Randy recently but needs to schedule a follow up. Appointment scheduled for 01/21/21. He denies urgent concerns or changes in care management needs. Strongly encouraged to consider grief counseling and call if assistance/care coordination is needed. He agreed to call if additional outreach is needed.   Patient Self Care Activities:  . Self administers medications as prescribed . Attends scheduled provider appointments . Calls pharmacy for medication refills . Performs ADL's independently . Performs IADL's independently.   Please see past updates related to this goal by clicking on the "Past Updates" button in the selected goal             Mr. Pfenning verbalized understanding of the information discussed during the telephonic outreach today. Declined need for mailed/printed resources or instructions. He will follow up in the clinic on 01/21/21. He agreed to call if additional outreach/care coordination is needed. The care management team will gladly assist.     [facility] [phone]

## 2021-01-29 DIAGNOSIS — E1129 Type 2 diabetes mellitus with other diabetic kidney complication: Secondary | ICD-10-CM | POA: Diagnosis not present

## 2021-01-29 DIAGNOSIS — E1169 Type 2 diabetes mellitus with other specified complication: Secondary | ICD-10-CM | POA: Diagnosis not present

## 2021-01-29 DIAGNOSIS — E1142 Type 2 diabetes mellitus with diabetic polyneuropathy: Secondary | ICD-10-CM | POA: Diagnosis not present

## 2021-01-29 DIAGNOSIS — E1165 Type 2 diabetes mellitus with hyperglycemia: Secondary | ICD-10-CM | POA: Diagnosis not present

## 2021-01-29 DIAGNOSIS — R809 Proteinuria, unspecified: Secondary | ICD-10-CM | POA: Diagnosis not present

## 2021-01-29 DIAGNOSIS — Z794 Long term (current) use of insulin: Secondary | ICD-10-CM | POA: Diagnosis not present

## 2021-01-29 DIAGNOSIS — E1159 Type 2 diabetes mellitus with other circulatory complications: Secondary | ICD-10-CM | POA: Diagnosis not present

## 2021-01-29 DIAGNOSIS — E785 Hyperlipidemia, unspecified: Secondary | ICD-10-CM | POA: Diagnosis not present

## 2021-02-01 ENCOUNTER — Other Ambulatory Visit: Payer: Self-pay | Admitting: Cardiovascular Disease

## 2021-02-01 ENCOUNTER — Other Ambulatory Visit: Payer: Self-pay | Admitting: Family Medicine

## 2021-02-01 NOTE — Telephone Encounter (Signed)
Requested Prescriptions  Pending Prescriptions Disp Refills  . omeprazole (PRILOSEC) 20 MG capsule [Pharmacy Med Name: Omeprazole 20 MG Oral Capsule Delayed Release] 90 capsule 0    Sig: Take 1 capsule by mouth once daily     Gastroenterology: Proton Pump Inhibitors Passed - 02/01/2021 10:17 AM      Passed - Valid encounter within last 12 months    Recent Outpatient Visits          1 week ago Chronic bilateral low back pain without sciatica   [facility] [name]., MD   3 weeks ago Skin lesion of left arm   [facility] [name]., MD   1 month ago    [facility] Rosanna Randy, Retia Passe., MD   3 months ago Type 2 diabetes mellitus with diabetic peripheral angiopathy without gangrene, with long-term current use of insulin [facility])   [facility] [name]., MD   4 months ago Acute pain of right knee   [facility] Chrismon, Vickki Muff, PA-C      Future Appointments            In 1 month [name]., MD [facility], [place]

## 2021-02-08 ENCOUNTER — Ambulatory Visit: Payer: HMO | Admitting: Physician Assistant

## 2021-02-08 NOTE — Progress Notes (Deleted)
Office Visit    Patient Name: Joseph [name] Date of Encounter: 02/08/2021  Primary Care Provider:  [name]., MD Primary Cardiologist:  Ida Rogue, MD  Chief Complaint    No chief complaint on file.   87 year old male with history of CAD s/p four-vessel CABG in 1994, 11/2019 non-STEMI with subsequent LHC 12/17/2019 as outlined below, chronic combined CHF (EF 50 to 55%, 11/2019), aortic root / ascending Ao dilation (38 mm, 11/2019), mild MR, PAD with reported right SFA stenting, bilateral carotid dz with imaging in 2018 showing 1-39% stenosis, Mobitz type I AVB, SVT, NSVT, hyperlipidemia, poorly controlled DM2, hypothyroidism, history of tobacco use via pipe smoking for 15 to 20 years (quit 1982), arthritis of the knees, history of fatigue, shortness of breath, generalized weakness (felt to be his anginal equivalent), and here today for recent episode of chest pain and subsequent lightheadedness.  Past Medical History    Past Medical History:  Diagnosis Date  . CAD (coronary artery disease)    a. s/p CABG;  b. 07/2012 Cath: 3VD including 80 dLCX (med Rx), 4/4 patent grafts.  . Chronic diastolic CHF (congestive heart failure) ([place])    a. 07/2012 Echo: EF 55-60%, no rwma, Gr 1 DD, mild MR;  04/2014 Echo: EF 55-60%, no rwma, mild MR, mildly dil LA.  . Diabetes mellitus, type 2 ([place])   . HTN (hypertension)   . Hyperlipidemia   . Hypothyroidism   . Lower extremity edema    a. 03/2014 amlodipine d/c'd.  Marland Kitchen Spinal stenosis    Past Surgical History:  Procedure Laterality Date  . APPENDECTOMY    . CARDIAC CATHETERIZATION  07/2012   [facility]/no stents  . CHOLECYSTECTOMY    . CORNEAL TRANSPLANT    . CORONARY ARTERY BYPASS GRAFT  1994  . HEMORRHOID SURGERY    . LEFT HEART CATH AND CORS/GRAFTS ANGIOGRAPHY N/A 12/17/2019   Procedure: LEFT HEART CATH AND CORS/GRAFTS ANGIOGRAPHY;  Surgeon: Nelva Bush, MD;  Location: [facility];  Service: Cardiovascular;  Laterality:  N/A;  . PROSTATE BIOPSY    . TOE SURGERY     ingrown toe nail    Allergies  No Known Allergies  History of Present Illness    Joseph [name] is a 87 y.o. male with PMH as above.    He has a significant history of CAD, including remote history of CABG x4 in 1994.  He underwent LHC 08/16/2012 in the setting of progressive shortness of breath and fatigue, felt to possibly be his anginal equivalent.  This LHC showed severe three-vessel disease with patent grafts x4.  There is 80% disease of a small to moderate size distal left circumflex.  EF 45 to 50%.  Findings were discussed with interventional cardiology and medical management recommended for the distal left circumflex disease.  06/2012 echo showed EF 55 to 60%, mild MR, no regional wall motion abnormalities, and G1DD.  He was seen 04/2014 with progressive DOE and HCTZ changed to Lasix. He diuresed 12 pounds but still felt SOB after diuresis.  Notes indicated suspicion that SOB may be his anginal equivalent.  Echo 04/2014 showed EF 55 to 60% and mild MR, and mild LAE.  Per EMR, cardiac catheterization was recommended; however, this LHC was declined / deferred by the patient.  Imdur was added.   He was seen 03/17/2016 with decline over the previous several months, including inability to walk greater than 100 feet without having to stop and take a break  to rest.  He was unable to take the trash out or climbing stairs.  His weight was stable at 230 pounds with 3 pillow orthopnea in the setting of GERD.  Cardiac catheterization was recommended and scheduled 03/31/2016; however, this was subsequently cancelled after he was admitted before his LHC to [facility] with AKI and hypoxia, as well as cholecystitis.  He underwent cholecystectomy.  He was seen at follow-up 05/16/2016 and was reportedly doing well with no indication for rescheduling LHC noted by his primary cardiologist due to lack of angina.  He was admitted 11/2019 after report of chest pain in the office  that had started 4 days earlier.  Chest pain had started at rest during the evening and was associated with some belching, which relieved the discomfort.  He had reportedly had a large dinner that night and also was often constipated. However, the CP still continued and recommendation was for admission given Tn of 588 in office. EKG showed NSR with PVCs in a bigeminal pattern.  Subsequent EKG showed NSR, Mobitz type I, AVB, LAD, LBBB.  Telemetry was significant for Mobitz type I second-degree AV block.  Plan on admission was for cardiac catheterization 2/2 non-STEMI.    LHC 12/17/2019 was performed and showed severe CAD.  Cath showed 80% dLMCA, 100% pLAD, and diffuse LCx/OM disease.  90% MRCA stenosis and CTO of the RPDA was also present.  He had a patent LIMA-D3 with backfilling of the mid/distal LAD. Also noted was patent SVG-D1-D2, SVG to OM 1-OM 2, and SVG-RPDA-RPL.  Mild to moderately elevated LVEDP and moderately reduced LVSF was also noted.  Subsequent echo showed EF 50 to 55%/low normal function with mild LVH, moderate hypokinesis of the left ventricle and mid anterior septal wall, G1DD, LV RWMA, mildly reduced RVSF, mild LAE, mild MR, trivial TR, mild to moderate AV sclerosis/calcification without evidence of aortic stenosis, and aortic dilation/dilation of the aortic root and ascending aorta measuring 38 mm.  Recommendations were for medical therapy with DAPT (ASA and clopidogrel) for at least 12 months.    Given his telemetry and EKG findings, EP consultation of AV block was recommended, especially if ongoing symptoms.  He subsequently underwent cardiac monitoring as below with NSR (average heart rate 62 bpm), 4 runs of VT (fastest 4 beats & max rate 222 bpm; longest 15 beats and average rate 120 bpm), 3 runs of SVT (fastest 5 beats, max rate 135 bpm; longest 6 beats, average rate 100 bpm), second-degree AV block Mobitz type I, isolated PACs, atrial couplets/triplets, occasional PVCs, rare ventricular  couplets and triplets, and ventricular bigeminy and trigeminy.  Today, 05/11/2020, he presents to clinic and notes that he has been under a significant amount of personal and financial stress lately.  He notes an episode of chest pain that was identical to that which occurred prior to his [facility] 12/17/2019.  CP was further described as occurring while he was trying to get the sheets on his mattress.  He reports that the CP lasted only minutes and was less severe but otherwise identical to before his cath as above.  Following resolution of his CP, he subsequently felt severe dizziness for the next 2 days but without falls or LOC.  Today, he denies any chest pain or further dizziness. He denies palpitations, pnd, orthopnea,syncope, weight gain, or early satiety.  He reports chronic and unchanged LEE. He reported significant stress due to taking care of his wife.  He did not have a regular exercise routine.  He  denied any further chest pain/dizziness since his initial event.  No signs or symptoms of bleeding.  He reported medication compliance.  Home Medications    Prior to Admission medications   Medication Sig Start Date End Date Taking? Authorizing Provider  aspirin EC 81 MG tablet Take 1 tablet (81 mg total) by mouth daily. 03/17/16  Yes Dunn, Areta Haber, PA-C  atorvastatin (LIPITOR) 80 MG tablet Take 1 tablet (80 mg total) by mouth every evening. 01/20/20  Yes Minna Merritts, MD  Cholecalciferol 125 MCG (5000 UT) capsule Take 5,000 Units by mouth at bedtime.   Yes [provider]  clobetasol cream (TEMOVATE) 2.53 % Apply 1 application topically 2 (two) times daily. As needed to hands and elbows   Yes [provider]  clopidogrel (PLAVIX) 75 MG tablet Take 1 tablet (75 mg total) by mouth daily. 01/20/20  Yes Gollan, Kathlene November, MD  Continuous Blood Gluc Sensor (FREESTYLE LIBRE 14 DAY SENSOR) MISC USE AS DIRECTED EVERY 14 DAYS 01/16/20  Yes [name]., MD  hydrocortisone 2.5 % cream  Apply 1 application topically 2 (two) times daily. As needed to face, crease, groin and butt 12/26/19  Yes [name]., MD  insulin aspart (NOVOLOG) 100 UNIT/ML injection Inject 0-9 Units into the skin 3 (three) times daily with meals. Patient taking differently: Inject into the skin 3 (three) times daily with meals. 10 am, 15 lunch, 20 pm 03/28/16  Yes Demetrios Loll, MD  insulin glargine (LANTUS) 100 UNIT/ML injection Inject 0.68 mLs (68 Units total) into the skin at bedtime. Patient taking differently: Inject 65 Units into the skin at bedtime.  03/28/16  Yes Demetrios Loll, MD  ketoconazole (NIZORAL) 2 % cream Apply 1 application topically 2 (two) times daily as needed for irritation. To groin, fave and buttocks. 12/26/19  Yes [name]., MD  levothyroxine (SYNTHROID, LEVOTHROID) 50 MCG tablet TAKE 1 TABLET BY MOUTH ONCE DAILY BEFORE BREAKFAST Patient taking differently: Take 25 mcg by mouth daily before breakfast.  01/01/19  Yes [name]., MD  losartan (COZAAR) 100 MG tablet Take 1 tablet (100 mg total) by mouth at bedtime. 01/20/20  Yes Gollan, Kathlene November, MD  Magnesium 250 MG TABS Take 250 mg by mouth as needed (swelling). Take when you take your Torsemide.   Yes [provider]  mometasone (ELOCON) 0.1 % ointment Apply topically daily. As needed to scalp and ears   Yes [provider]  nitroGLYCERIN (NITROSTAT) 0.4 MG SL tablet Place 1 tablet (0.4 mg total) under the tongue every 5 (five) minutes as needed for chest pain. 12/13/19  Yes Minna Merritts, MD  omeprazole (PRILOSEC) 20 MG capsule Take 1 capsule by mouth once daily Patient taking differently: Take 20 mg by mouth at bedtime.  09/17/19  Yes [name]., MD  potassium chloride (KLOR-CON) 10 MEQ tablet Take 1 tablet (10 mEq total) by mouth daily. 01/20/20  Yes Minna Merritts, MD  torsemide (DEMADEX) 20 MG tablet Take 1 tablet (20 mg total) by mouth as needed (As needed for swelling and  shortness of breath). Also take extra potassium 10 meq with this. 01/20/20 04/19/20  Minna Merritts, MD    Review of Systems    He reports recent CP similar to that before his [place] 11/2019 (yet less severe in intensity), followed by 2 days of dizziness; however, this has not recurred since that time. He denies palpitations, dyspnea, pnd, orthopnea, n, v,  dizziness, syncope, weight gain, or early satiety.   He reports chronic lower extremity edema, unchanged from previous visits.  All other systems reviewed and are otherwise negative except as noted above.  Physical Exam    VS:  There were no vitals taken for this visit. , BMI There is no height or weight on file to calculate BMI. GEN: Elderly male, in no acute distress. HEENT: normal. Neck: Supple, no JVD, carotid bruits, or masses. Cardiac: RRR with extrasystole / pauses appreciated, 1/6 systolic murmur. No rubs, or gallops. No clubbing, cyanosis. Mild LEE.  Radials/DP/PT 2+ and equal bilaterally.  Respiratory:  Respirations regular and unlabored, clear to auscultation bilaterally. GI: Soft, nontender, nondistended, BS + x 4. MS: no deformity or atrophy. Skin: warm and dry, no rash. Neuro:  Strength and sensation are intact. Psych: Normal affect.  Accessory Clinical Findings    ECG personally reviewed by me today -  63bpm, known LBBB, LAD, versus second degree MobitzI/II AVB / PVCs versus ventricular escape beats - no acute changes.  VITALS Reviewed today   Temp Readings from Last 3 Encounters:  01/05/21 97.6 F (36.4 C) (Oral)  10/21/20 98 F (36.7 C) (Oral)  10/01/20 98.4 F (36.9 C) (Oral)   BP Readings from Last 3 Encounters:  01/21/21 (!) 185/87  01/05/21 (!) 183/79  10/21/20 (!) 183/77   Pulse Readings from Last 3 Encounters:  01/21/21 97  01/05/21 66  10/21/20 85    Wt Readings from Last 3 Encounters:  01/21/21 220 lb (99.8 kg)  01/05/21 219 lb (99.3 kg)  10/21/20 219 lb (99.3 kg)     LABS  reviewed today    Lab Results  Component Value Date   WBC 5.6 05/11/2020   HGB 13.8 05/11/2020   HCT 40.6 05/11/2020   MCV 89 05/11/2020   PLT 222 05/11/2020   Lab Results  Component Value Date   CREATININE 1.16 06/22/2020   BUN 16 06/22/2020   NA 140 06/22/2020   K 4.4 06/22/2020   CL 106 06/22/2020   CO2 29 06/22/2020   Lab Results  Component Value Date   ALT 21 12/16/2019   AST 24 12/16/2019   ALKPHOS 47 12/16/2019   BILITOT 0.6 12/16/2019   Lab Results  Component Value Date   CHOL 121 12/17/2019   HDL 34 (L) 12/17/2019   LDLCALC 67 12/17/2019   TRIG 98 12/17/2019   CHOLHDL 3.6 12/17/2019    Lab Results  Component Value Date   HGBA1C 9.4 09/15/2020   Lab Results  Component Value Date   TSH 2.220 05/11/2020     STUDIES/PROCEDURES reviewed today   Cardiac monitor 12/18/2019 Normal sinus rhythm avg HR of 62 bpm. 4 Ventricular Tachycardia runs occurred, the run with the fastest interval lasting 4 beats with a max rate of 222 bpm, the longest lasting 15 beats with an avg rate of 120 bpm.  3 Supraventricular Tachycardia runs occurred, the run with the fastest interval lasting 5 beats with a max rate of 135 bpm, the longest lasting 6 beats with an avg rate of 100 bpm.  Second Degree AV Block-Mobitz I (Wenckebach) was present.  Isolated SVEs were rare (<1.0%), SVE Couplets were rare (<1.0%), and SVE Triplets were rare (<1.0%). Isolated VEs were occasional (3.5%, 39247), VE Couplets were rare (<1.0%, 1522), and VE Triplets were rare (<1.0%, 10). Ventricular Bigeminy and Trigeminy were present. No patient triggered events recorded.  Echocardiogram 12/16/2018: 1. Left ventricular ejection fraction, by visual estimation, is 50 to  55%. The left ventricle has low normal function. There is mildly increased  left ventricular hypertrophy.  2. Moderate hypokinesis of the left ventricular, mid anteroseptal wall.  3. Left ventricular diastolic parameters are consistent with Grade I   diastolic dysfunction (impaired relaxation).  4. The left ventricle demonstrates regional wall motion abnormalities.  5. Global right ventricle has mildly reduced systolic function.The right  ventricular size is normal. No increase in right ventricular wall  thickness.  6. Left atrial size was mildly dilated.  7. Right atrial size was normal.  8. The mitral valve is normal in structure. Mild mitral valve  regurgitation. No evidence of mitral stenosis.  9. The tricuspid valve is normal in structure.  10. The tricuspid valve is normal in structure. Tricuspid valve  regurgitation is trivial.  11. The aortic valve is tricuspid. Aortic valve regurgitation is not  visualized. Mild to moderate aortic valve sclerosis/calcification without  any evidence of aortic stenosis.  12. The pulmonic valve was not well visualized. Pulmonic valve  regurgitation is not visualized.  13. Aortic dilatation noted.  14. There is mild dilatation of the aortic root and of the ascending aorta  measuring 38 mm.  15. TR signal is inadequate for assessing pulmonary artery systolic  pressure.  16. The inferior vena cava is normal in size with greater than 50%  respiratory variability, suggesting right atrial pressure of 3 mmHg.  17. The interatrial septum was not well visualized.   LHC 12/17/2019  The left ventricular ejection fraction is 35-45% by visual estimate. Conclusions: 1. Severe native CAD, including 80% distal LMCA, 100% proximal LAD and diffuse LCx/OM disease. 90% mid RCA stenosis and CTO of rPDA also present. 2. Patent LIMA->D3 with backfilling of the mid/distal LADl. 3. Patent SVG-D1-D2, SVG-OM1-OM2, and SVG-rPDA-rPL. 4. Mildly to moderately elevated LVEDP. 5. At least moderately reduced left ventricular systolic function. Recommendations: 1. Medical therapy; recommend up to 12 months of DAPT with ASA and clopidogrel, as tolerated. 2. Aggressive secondary prevention of coronary artery  disease. 3. Obtain echocardiogram to better assess LVEF.Marland Kitchen 4. Monitor on telemetry. Consider EP consultation of AV block persists, particularly if the patient is symptoms.  US Carotid 2018 Notes recorded by Minna Merritts, MD on 03/13/2017 at 6:28 PM EDT Carotid ultrasound Less than 39% stenosis bilaterally Stable  repeat ultrasound only as needed Dr. Rockey Situ, MD  ---  Orthostatics 05/11/20 Orthostatic Vital Signs:    BP  HR  Lying:  181/66  51  Sitting:  166/71  80 Standing:  124/70  102 2 minutes: 157/65  157/65  Assessment & Plan    Chest pain and dizziness CAD s/p CABG x4 and non-ST elevation myocardial infarction --Reports CP similar to that before his 12/17/2019 LHC.  Subsequently, he was dizzy for approximately 2 days.  CP onset was while he was putting sheets on his bed.  Significant history of CAD as outlined above and HPI.  Most recent [facility] 1/26 as above with recommendation for medical management with DAPT.  Echo as above. --Given his CP with report that it was similar to his previous NSTEMI, recommendation was for further ischemic work-up with echo +/- MPI +/- LHC; however, given his current financial and personal stressors as above, this was politely declined at this time.  He is aware that he should call the office or present to the ED if further episodes of chest pain. Reassess at RTC. --Given dizziness and CP, referral to EP provided today with patient agreeable.  At this time,  he is hemodynamically stable and chronotropic competence demonstrated during visit. His orthostatics as above are positive but with patient denying any sx when moving positions. He will call the office if any further sx or present to the ED. --Continue medical management.  He reports compliance with both ASA 81 mg and Plavix.  No signs or symptoms of bleeding. BB discontinued by his primary cardiologist for AVB as below. Continue statin and PRN SL nitro. Obtain BMET, CBC, and TSH.   History of  Mobitz I AVB, Bradycardia History of NSVT, pSVT, AT Frequent PVCs / Ventricular Couplets  --Reports dizziness x2 days after an episode of CP with unclear etiology and concern for SSS or progression to CHB.  History of Mobitz I AVB, bradycardia, AT, SVT, and NSVT in the past. Previous monitor as above. BB discontinued due to AVB at previous visit with return of NSVT and SVT, as well as PVCs.  Will refer to EP for further evaluation. Will also obtain BMET and TSH. He will present to the ED if any further dizziness or concerning sx or call the office, given his hesitation to present to the ED 2/2 financial stressors.  Orthostatic hypotension --As above, orthostatics today positive with patient denying any sx at that time. See above. Consider as contributing to his sx.  Chronic combined heart failure (low normal EF 50-55%) --Appears euvolemic and denies s/sx of worsening HF. Unchanged, chronic LEE as below.. Wt down from previous visits. 1/26 echo as above with low nl EF 50-55%, mild LVH, moderate hypokinesis as above, G1DD, mildly reduced RVSF, and mild MR.  Continue current medications with losartan 100mg  and torsemide 20mg  with KCL tab 40mEq. As above, will check BMET/ electrolytes and renal function today. No BB, given discontinued at previous visits 2/2 AVB. Given faster runs of SVT and NSVT with AVB and BB discontinued, referral provided to EP as above.  .   Mild MR --Periodic echo recommended to monitor.HR and BP control recommended. Continue to monitor.   Aortic root dilation, ascending aorta dilation (8mm) --Continue to monitor with periodic echo. HR and BP control, as well as cholesterol control. Avoid FQ and heavy lifting.    HTN --BP mildly elevated. Will hold off on any medication changes pending EP evaluation. BB has been discontinued at previous visits 2/2 AVB. Continue ARB. BMET pending.  HLD --Continue statin. LDL goal <70.  PAD s/p R SFA stenting Bilateral carotid dz --Denies  s/sx of claudication s/p stenting.  No amaurosis fugax. Does report dizziness as above with no significant bruit appreciated on exam. If dizziness continues with ischemic and EP workup without significant findings, consider repeat carotid US given previous study was in 2018. Continue high intensity statin and DAPT with ASA and Plavix. Glycemic control recommended, in addition to LDL goal <70.   DM2 --Strict glycemic control recommended for risk factor modification, as well as activity as tolerated.   Hypothyroidism --Check TSH, given recent dizziness and EKG as above.  Continue medication per PCP.  Medication changes: None Labs ordered: BMET, CBC, TSH Studies / Imaging ordered: None Future considerations: Patient deferred ischemic workup. Reassess at RTC. Disposition: RTC following EP appointment     Arvil Chaco, PA-C 02/08/2021

## 2021-02-09 ENCOUNTER — Ambulatory Visit: Payer: PPO | Admitting: Family Medicine

## 2021-02-09 ENCOUNTER — Encounter: Payer: Self-pay | Admitting: Cardiovascular Disease

## 2021-02-10 ENCOUNTER — Telehealth: Payer: Self-pay | Admitting: *Deleted

## 2021-02-10 NOTE — Telephone Encounter (Signed)
Lattie Haw, Case Manager with [employer] requesting patient's most recent lab results. Information provided.

## 2021-02-16 NOTE — Progress Notes (Signed)
This encounter was created in error - please disregard.

## 2021-02-23 ENCOUNTER — Other Ambulatory Visit: Payer: Self-pay

## 2021-02-23 ENCOUNTER — Ambulatory Visit: Payer: HMO | Admitting: Physician Assistant

## 2021-02-23 ENCOUNTER — Encounter: Payer: Self-pay | Admitting: Physician Assistant

## 2021-02-23 VITALS — BP 152/82 | HR 91 | Ht 66.0 in | Wt 218.0 lb

## 2021-02-23 DIAGNOSIS — E785 Hyperlipidemia, unspecified: Secondary | ICD-10-CM | POA: Diagnosis not present

## 2021-02-23 DIAGNOSIS — Z79899 Other long term (current) drug therapy: Secondary | ICD-10-CM

## 2021-02-23 DIAGNOSIS — E1151 Type 2 diabetes mellitus with diabetic peripheral angiopathy without gangrene: Secondary | ICD-10-CM | POA: Diagnosis not present

## 2021-02-23 DIAGNOSIS — I447 Left bundle-branch block, unspecified: Secondary | ICD-10-CM | POA: Diagnosis not present

## 2021-02-23 DIAGNOSIS — I214 Non-ST elevation (NSTEMI) myocardial infarction: Secondary | ICD-10-CM

## 2021-02-23 DIAGNOSIS — I2510 Atherosclerotic heart disease of native coronary artery without angina pectoris: Secondary | ICD-10-CM | POA: Diagnosis not present

## 2021-02-23 DIAGNOSIS — I10 Essential (primary) hypertension: Secondary | ICD-10-CM | POA: Diagnosis not present

## 2021-02-23 DIAGNOSIS — I5033 Acute on chronic diastolic (congestive) heart failure: Secondary | ICD-10-CM

## 2021-02-23 DIAGNOSIS — Z951 Presence of aortocoronary bypass graft: Secondary | ICD-10-CM | POA: Diagnosis not present

## 2021-02-23 DIAGNOSIS — Z8679 Personal history of other diseases of the circulatory system: Secondary | ICD-10-CM

## 2021-02-23 NOTE — Progress Notes (Signed)
Office Visit    Patient Name: KASE SHUGHART Date of Encounter: 02/23/2021  Primary Care Provider:  [name]., MD Primary Cardiologist:  Ida Rogue, MD  Chief Complaint    Chief Complaint  Patient presents with  . Follow-up    51 month F/U    87 year old male with history of CAD s/p four-vessel CABG in 1994, 11/2019 non-STEMI with subsequent LHC 12/17/2019 as outlined below, chronic combined CHF (EF 50 to 55%, 11/2019), aortic root / ascending Ao dilation (38 mm, 11/2019), mild MR, PAD with reported right SFA stenting, bilateral carotid dz with imaging in 2018 showing 1-39% stenosis, Mobitz type I AVB, SVT, NSVT, hyperlipidemia, poorly controlled DM2, hypothyroidism, history of tobacco use via pipe smoking for 15 to 20 years (quit 1982), arthritis of the knees, history of fatigue, shortness of breath, generalized weakness (felt to be his anginal equivalent), and here today for 1 year RTC.  Past Medical History    Past Medical History:  Diagnosis Date  . CAD (coronary artery disease)    a. s/p CABG;  b. 07/2012 Cath: 3VD including 80 dLCX (med Rx), 4/4 patent grafts.  . Chronic diastolic CHF (congestive heart failure) ([place])    a. 07/2012 Echo: EF 55-60%, no rwma, Gr 1 DD, mild MR;  04/2014 Echo: EF 55-60%, no rwma, mild MR, mildly dil LA.  . Diabetes mellitus, type 2 ([place])   . HTN (hypertension)   . Hyperlipidemia   . Hypothyroidism   . Lower extremity edema    a. 03/2014 amlodipine d/c'd.  Marland Kitchen Spinal stenosis    Past Surgical History:  Procedure Laterality Date  . APPENDECTOMY    . CARDIAC CATHETERIZATION  07/2012   [facility]/no stents  . CHOLECYSTECTOMY    . CORNEAL TRANSPLANT    . CORONARY ARTERY BYPASS GRAFT  1994  . HEMORRHOID SURGERY    . LEFT HEART CATH AND CORS/GRAFTS ANGIOGRAPHY N/A 12/17/2019   Procedure: LEFT HEART CATH AND CORS/GRAFTS ANGIOGRAPHY;  Surgeon: Nelva Bush, MD;  Location: [facility];  Service: Cardiovascular;  Laterality: N/A;   . PROSTATE BIOPSY    . TOE SURGERY     ingrown toe nail    Allergies  No Known Allergies  History of Present Illness    PENIEL HASS is a 87 y.o. male with PMH as above.    He has a significant history of CAD, including remote history of CABG x4 in 1994.  He underwent LHC 08/16/2012 in the setting of progressive shortness of breath and fatigue, felt to possibly be his anginal equivalent.  This LHC showed severe three-vessel disease with patent grafts x4.  There is 80% disease of a small to moderate size distal left circumflex.  EF 45 to 50%.  Findings were discussed with interventional cardiology and medical management recommended for the distal left circumflex disease.  06/2012 echo showed EF 55 to 60%, mild MR, no regional wall motion abnormalities, and G1DD.  He was seen 04/2014 with progressive DOE and HCTZ changed to Lasix. He diuresed 12 pounds but still felt SOB after diuresis.  Notes indicated suspicion that SOB may be his anginal equivalent.  Echo 04/2014 showed EF 55 to 60% and mild MR, and mild LAE.  Per EMR, cardiac catheterization was recommended; however, this LHC was declined / deferred by the patient.  Imdur was added.   He was seen 03/17/2016 with decline over the previous several months, including inability to walk greater than 100 feet without having  to stop and take a break to rest.  He was unable to take the trash out or climbing stairs.  His weight was stable at 230 pounds with 3 pillow orthopnea in the setting of GERD.  Cardiac catheterization was recommended and scheduled 03/31/2016; however, this was subsequently cancelled after he was admitted before his LHC to [facility] with AKI and hypoxia, as well as cholecystitis.  He underwent cholecystectomy.  He was seen at follow-up 05/16/2016 and was reportedly doing well with no indication for rescheduling LHC noted by his primary cardiologist due to lack of angina.  He was admitted 11/2019 after report of chest pain in the office that  had started 4 days earlier.  Chest pain had started at rest during the evening and was associated with some belching, which relieved the discomfort.  He had reportedly had a large dinner that night and also was often constipated. However, the CP still continued and recommendation was for admission given Tn of 588 in office. EKG showed NSR with PVCs in a bigeminal pattern.  Subsequent EKG showed NSR, Mobitz type I, AVB, LAD, LBBB.  Telemetry was significant for Mobitz type I second-degree AV block.  Plan on admission was for cardiac catheterization 2/2 non-STEMI.    LHC 12/17/2019 was performed and showed severe CAD.  Cath showed 80% dLMCA, 100% pLAD, and diffuse LCx/OM disease.  90% MRCA stenosis and CTO of the RPDA was also present.  He had a patent LIMA-D3 with backfilling of the mid/distal LAD. Also noted was patent SVG-D1-D2, SVG to OM 1-OM 2, and SVG-RPDA-RPL.  Mild to moderately elevated LVEDP and moderately reduced LVSF was also noted.  Subsequent echo showed EF 50 to 55%/low normal function with mild LVH, moderate hypokinesis of the left ventricle and mid anterior septal wall, G1DD, LV RWMA, mildly reduced RVSF, mild LAE, mild MR, trivial TR, mild to moderate AV sclerosis/calcification without evidence of aortic stenosis, and aortic dilation/dilation of the aortic root and ascending aorta measuring 38 mm.  Recommendations were for medical therapy with DAPT (ASA and clopidogrel) for at least 12 months.    Given his telemetry and EKG findings, EP consultation of AV block was recommended, especially if ongoing symptoms.  He subsequently underwent cardiac monitoring as below with NSR (average heart rate 62 bpm), 4 runs of VT (fastest 4 beats & max rate 222 bpm; longest 15 beats and average rate 120 bpm), 3 runs of SVT (fastest 5 beats, max rate 135 bpm; longest 6 beats, average rate 100 bpm), second-degree AV block Mobitz type I, isolated PACs, atrial couplets/triplets, occasional PVCs, rare ventricular  couplets and triplets, and ventricular bigeminy and trigeminy.  Seen 05/11/2020 and under a significant amount of personal and financial stress.  He had an episode of chest pain that was identical to that which occurred prior to his [facility] 12/17/2019.  CP was further described as occurring while he was trying to get the sheets on his mattress.  He reports that the CP lasted only minutes and was less severe but otherwise identical to before his cath as above.  Following resolution of his CP, he subsequently felt severe dizziness for the next 2 days but without falls or LOC.  He had chronic and unchanged LEE. He reported significant stress due to taking care of his wife.  He did not have a regular exercise routine.  He denied any further chest pain/dizziness since his initial event.  No signs or symptoms of bleeding.  He reported medication compliance.  He was  seen the following month and noted to be volume up.  He was changed to torsemide on a Monday Wednesday Friday basis.  Seen 06/2020 by EP with report of weakness in his legs with walking short distances.  He also reported dizziness in the shower.  No chest pain.  He had recurrent peripheral edema and was taking his torsemide on an as-needed basis.  Orthostatic vital signs were not impressive when compared with a couple of months prior.  He reported episodic dizziness with unclear mechanism.  It was requested that he monitor BP with these episodes.  Also requested was that he start taking his diuretic on a predictable basis, such as Monday Wednesday Friday.  Today, 02/23/2021, he returns to clinic for 1 year follow-up.  He reports he felt dizzy one day last week, estimating as Friday or Saturday of last week and while pulling up something in the yard.  He subsequently went in the house to rest.  He felt heavy in the stomach/upper epigastric area, further described as a discomfort or chest tightness.  He took a sublingual nitro with improvement in his symptoms.  He  states that, for the rest of the day, he felt lightheaded and as if he himself was moving, rather than the world moving around him.  He estimates dizziness is occurring from about 2pm - 9pm.  He notes concern today, as this feeling was very similar to when he went to the ED for his non-STEMI but not as severe in intensity.  He wonders about his heart.  He denies any racing heart rate or palpitations.  No syncope.  He does report fatigue in the setting of his wife passing away in February 2022.  He has had low motivation.  He reports that he was pulling up weeds in his yard, as he had noticed his yard was neglected for some time after his wife's passing.  He is trying to stay hydrated, though does report that he has only had 1 glass of water today.  He is drinking decaf tea.  He has not been taking his torsemide on a Monday/Wednesday/Friday basis but rather as needed.  He denies any signs or symptoms of volume overload, though noted on exam.  No signs or symptoms consistent with bleeding.  Home Medications    Prior to Admission medications   Medication Sig Start Date End Date Taking? Authorizing Provider  aspirin EC 81 MG tablet Take 1 tablet (81 mg total) by mouth daily. 03/17/16  Yes Dunn, Areta Haber, PA-C  atorvastatin (LIPITOR) 80 MG tablet Take 1 tablet (80 mg total) by mouth every evening. 01/20/20  Yes Minna Merritts, MD  Cholecalciferol 125 MCG (5000 UT) capsule Take 5,000 Units by mouth at bedtime.   Yes [provider]  clobetasol cream (TEMOVATE) 0.93 % Apply 1 application topically 2 (two) times daily. As needed to hands and elbows   Yes [provider]  clopidogrel (PLAVIX) 75 MG tablet Take 1 tablet (75 mg total) by mouth daily. 01/20/20  Yes Gollan, Kathlene November, MD  Continuous Blood Gluc Sensor (FREESTYLE LIBRE 14 DAY SENSOR) MISC USE AS DIRECTED EVERY 14 DAYS 01/16/20  Yes [name]., MD  hydrocortisone 2.5 % cream Apply 1 application topically 2 (two) times daily. As  needed to face, crease, groin and butt 12/26/19  Yes [name]., MD  insulin aspart (NOVOLOG) 100 UNIT/ML injection Inject 0-9 Units into the skin 3 (three) times daily with meals. Patient taking differently:  Inject into the skin 3 (three) times daily with meals. 10 am, 15 lunch, 20 pm 03/28/16  Yes Demetrios Loll, MD  insulin glargine (LANTUS) 100 UNIT/ML injection Inject 0.68 mLs (68 Units total) into the skin at bedtime. Patient taking differently: Inject 65 Units into the skin at bedtime.  03/28/16  Yes Demetrios Loll, MD  ketoconazole (NIZORAL) 2 % cream Apply 1 application topically 2 (two) times daily as needed for irritation. To groin, fave and buttocks. 12/26/19  Yes [name]., MD  levothyroxine (SYNTHROID, LEVOTHROID) 50 MCG tablet TAKE 1 TABLET BY MOUTH ONCE DAILY BEFORE BREAKFAST Patient taking differently: Take 25 mcg by mouth daily before breakfast.  01/01/19  Yes [name]., MD  losartan (COZAAR) 100 MG tablet Take 1 tablet (100 mg total) by mouth at bedtime. 01/20/20  Yes Gollan, Kathlene November, MD  Magnesium 250 MG TABS Take 250 mg by mouth as needed (swelling). Take when you take your Torsemide.   Yes [provider]  mometasone (ELOCON) 0.1 % ointment Apply topically daily. As needed to scalp and ears   Yes [provider]  nitroGLYCERIN (NITROSTAT) 0.4 MG SL tablet Place 1 tablet (0.4 mg total) under the tongue every 5 (five) minutes as needed for chest pain. 12/13/19  Yes Minna Merritts, MD  omeprazole (PRILOSEC) 20 MG capsule Take 1 capsule by mouth once daily Patient taking differently: Take 20 mg by mouth at bedtime.  09/17/19  Yes [name]., MD  potassium chloride (KLOR-CON) 10 MEQ tablet Take 1 tablet (10 mEq total) by mouth daily. 01/20/20  Yes Minna Merritts, MD  torsemide (DEMADEX) 20 MG tablet Take 1 tablet (20 mg total) by mouth as needed (As needed for swelling and shortness of breath). Also take extra potassium 10 meq with  this. 01/20/20 04/19/20  Minna Merritts, MD    Review of Systems    He reports recent epigastric / lower chest tightness and dizziness similar to that before his LHC 11/2019 (yet less severe in intensity). He denies palpitations, dyspnea, pnd, orthopnea, n, v, dizziness, syncope, weight gain, or early satiety.   All other systems reviewed and are otherwise negative except as noted above.  Physical Exam    VS:  BP (!) 152/82 (BP Location: Left Arm, Patient Position: Sitting, Cuff Size: Large)   Pulse 91   Ht 5\' 6"  (1.676 m)   Wt 218 lb (98.9 kg)   BMI 35.19 kg/m  , BMI Body mass index is 35.19 kg/m. GEN: Elderly male, in no acute distress. HEENT: normal. Neck: Supple, no JVD, carotid bruits, or masses. Cardiac: RRR with extrasystole appreciated, 1/6 systolic murmur. No rubs, or gallops. No clubbing, cyanosis. 1-2+ bilateral LEE.  Radials/DP/PT 2+ and equal bilaterally.  Respiratory:  Respirations regular and unlabored, clear to auscultation bilaterally. GI: slightly distended, BS + x 4. MS: no deformity or atrophy. Skin: warm and dry, no rash. Neuro:  Strength and sensation are intact. Psych: Normal affect.  Accessory Clinical Findings    ECG personally reviewed by me today -sinus rhythm with sinus arrhythmia and first-degree AV block, 91bpm, occasional PVCs and rare PAC, LVH with repolarization abnormality, PR interval 214 ms, QRS 126 ms with history of LBBB, QTC 49 ms.  VITALS Reviewed today   Temp Readings from Last 3 Encounters:  01/05/21 97.6 F (36.4 C) (Oral)  10/21/20 98 F (36.7 C) (Oral)  10/01/20 98.4 F (36.9 C) (Oral)   BP Readings from Last 3  Encounters:  02/23/21 (!) 152/82  01/21/21 (!) 185/87  01/05/21 (!) 183/79   Pulse Readings from Last 3 Encounters:  02/23/21 91  01/21/21 97  01/05/21 66    Wt Readings from Last 3 Encounters:  02/23/21 218 lb (98.9 kg)  01/21/21 220 lb (99.8 kg)  01/05/21 219 lb (99.3 kg)     LABS  reviewed today   Lab  Results  Component Value Date   WBC 5.6 05/11/2020   HGB 13.8 05/11/2020   HCT 40.6 05/11/2020   MCV 89 05/11/2020   PLT 222 05/11/2020   Lab Results  Component Value Date   CREATININE 1.16 06/22/2020   BUN 16 06/22/2020   NA 140 06/22/2020   K 4.4 06/22/2020   CL 106 06/22/2020   CO2 29 06/22/2020   Lab Results  Component Value Date   ALT 21 12/16/2019   AST 24 12/16/2019   ALKPHOS 47 12/16/2019   BILITOT 0.6 12/16/2019   Lab Results  Component Value Date   CHOL 121 12/17/2019   HDL 34 (L) 12/17/2019   LDLCALC 67 12/17/2019   TRIG 98 12/17/2019   CHOLHDL 3.6 12/17/2019    Lab Results  Component Value Date   HGBA1C 9.4 09/15/2020   Lab Results  Component Value Date   TSH 2.220 05/11/2020     STUDIES/PROCEDURES reviewed today   Cardiac monitor 01/15/20 Normal sinus rhythm avg HR of 62 bpm. 4 Ventricular Tachycardia runs occurred, the run with the fastest interval lasting 4 beats with a max rate of 222 bpm, the longest lasting 15 beats with an avg rate of 120 bpm.  3 Supraventricular Tachycardia runs occurred, the run with the fastest interval lasting 5 beats with a max rate of 135 bpm, the longest lasting 6 beats with an avg rate of 100 bpm.  Second Degree AV Block-Mobitz I (Wenckebach) was present.  Isolated SVEs were rare (<1.0%), SVE Couplets were rare (<1.0%), and SVE Triplets were rare (<1.0%). Isolated VEs were occasional (3.5%, 39247), VE Couplets were rare (<1.0%, 1522), and VE Triplets were rare (<1.0%, 10). Ventricular Bigeminy and Trigeminy were present. No patient triggered events recorded.  Echocardiogram 12/16/2018: 1. Left ventricular ejection fraction, by visual estimation, is 50 to  55%. The left ventricle has low normal function. There is mildly increased  left ventricular hypertrophy.  2. Moderate hypokinesis of the left ventricular, mid anteroseptal wall.  3. Left ventricular diastolic parameters are consistent with Grade I  diastolic  dysfunction (impaired relaxation).  4. The left ventricle demonstrates regional wall motion abnormalities.  5. Global right ventricle has mildly reduced systolic function.The right  ventricular size is normal. No increase in right ventricular wall  thickness.  6. Left atrial size was mildly dilated.  7. Right atrial size was normal.  8. The mitral valve is normal in structure. Mild mitral valve  regurgitation. No evidence of mitral stenosis.  9. The tricuspid valve is normal in structure.  10. The tricuspid valve is normal in structure. Tricuspid valve  regurgitation is trivial.  11. The aortic valve is tricuspid. Aortic valve regurgitation is not  visualized. Mild to moderate aortic valve sclerosis/calcification without  any evidence of aortic stenosis.  12. The pulmonic valve was not well visualized. Pulmonic valve  regurgitation is not visualized.  13. Aortic dilatation noted.  14. There is mild dilatation of the aortic root and of the ascending aorta  measuring 38 mm.  15. TR signal is inadequate for assessing pulmonary artery systolic  pressure.  16. The inferior vena cava is normal in size with greater than 50%  respiratory variability, suggesting right atrial pressure of 3 mmHg.  17. The interatrial septum was not well visualized.   LHC 12/17/2019  The left ventricular ejection fraction is 35-45% by visual estimate. Conclusions: 1. Severe native CAD, including 80% distal LMCA, 100% proximal LAD and diffuse LCx/OM disease. 90% mid RCA stenosis and CTO of rPDA also present. 2. Patent LIMA->D3 with backfilling of the mid/distal LADl. 3. Patent SVG-D1-D2, SVG-OM1-OM2, and SVG-rPDA-rPL. 4. Mildly to moderately elevated LVEDP. 5. At least moderately reduced left ventricular systolic function. Recommendations: 1. Medical therapy; recommend up to 12 months of DAPT with ASA and clopidogrel, as tolerated. 2. Aggressive secondary prevention of coronary artery  disease. 3. Obtain echocardiogram to better assess LVEF.Marland Kitchen 4. Monitor on telemetry. Consider EP consultation of AV block persists, particularly if the patient is symptoms.  US Carotid 2018 Notes recorded by Minna Merritts, MD on 03/13/2017 at 6:28 PM EDT Carotid ultrasound Less than 39% stenosis bilaterally Stable  repeat ultrasound only as needed Dr. Rockey Situ, MD  ---  Orthostatics 05/11/20 Orthostatic Vital Signs:    BP  HR  Lying:  181/66  51  Sitting:  166/71  80 Standing:  124/70  102 2 minutes: 157/65  157/65  Assessment & Plan    AOC combined heart failure (low normal EF 50-55%) --Volume up on exam. 1/26 echo as above with low nl EF 50-55%, mild LVH, moderate hypokinesis as above, G1DD, mildly reduced RVSF, and mild MR.  Continue current medications with losartan 100mg  and torsemide. He is not taking his torsemide on a regular basis with recommendation again to take on MWF schedule. No BB, given discontinued at previous visits 2/2 AVB. Will increase to torsemide 20mg  daily for three days (with Mg and K supplementation) and then drop back down to MWF torsemide 20mg  daily dosing. Discussed daily wts and BP checks. Reviewed salt and fluid intake. Repeat BMET at RTC. Will update an echo today.   Chronic Epigastric discomfort / Chest pain and dizziness CAD s/p CABG x4 and non-ST elevation myocardial infarction --Reports another episode of epigastric pain / CP and dizziness similar to that before his 12/17/2019 LHC.  This has been noted in the past. Significant history of CAD as outlined above and HPI.  Most recent [facility] 1/26 as above with recommendation for medical management with DAPT.  For dizziness, he was evaluated by EP with recommendation for increased diuresis. he is volume up today as above and not regularly taking his diuretic with recommendations for increased dosing as above. Consider that his sx may be elicited by volume overload. Continue ASA 81 mg and Plavix with PRN SL  nitro.  No signs or symptoms of bleeding. BB discontinued for AVB as below. Continue statin. Will update an echo today.   History of Mobitz I AVB, Bradycardia History of NSVT, pSVT, AT Frequent PVCs / Ventricular Couplets  --Reports dizziness and upper epigastric pain / CP as noted in past.  History of Mobitz I AVB, bradycardia, AT, SVT, and NSVT in the past. Previous monitor as above. BB discontinued due to AVB at previous visit with return of NSVT and SVT, as well as PVCs.  EP evaluated with recommendation for increased diuresis with pt volume up again today and recommendations as above for 3d increased diuresis then MWF dosing. Update echo.  Orthostatic hypotension --Improved sx with slow position changes.  Mild MR --Periodic echo recommended to monitor.HR and BP control  recommended. Will update his echo.   Aortic root dilation, ascending aorta dilation (21mm) --Continue to monitor with periodic echo. HR and BP control, as well as cholesterol control. Avoid FQ and heavy lifting.  Will order repeat echo today.   HTN --BP elevated in setting of volume overload. MWF diuresis schedule discussed as above. Suspect improved BP with diuresis. Continue ARB. BMET pending.  HLD --Continue statin. LDL goal <70.  PAD s/p R SFA stenting Bilateral carotid dz --Denies s/sx of claudication s/p stenting.  No amaurosis fugax. Does report dizziness as above with no significant bruit appreciated on exam. Could consider repeat carotid US - will defer for now. Continue high intensity statin and DAPT with ASA and Plavix. Glycemic control recommended, in addition to LDL goal <70.   DM2 --Strict glycemic control recommended for risk factor modification, as well as activity as tolerated.   Hypothyroidism --Continue medication per PCP.  Medication changes: Torsemide schedule of MWF after 3 days of torsemide 20mg  daily with Mg/K supplementation Labs ordered: BMET 1- 2 weeks Studies / Imaging ordered:  Echo Future considerations:Pending echo, ?carotids Disposition: RTC 1-2 weeks  Arvil Chaco, PA-C 02/23/2021

## 2021-02-23 NOTE — Patient Instructions (Addendum)
Medication Instructions:   Take Torsemide 20 mg daily for three days (take your Magnesium and Potassium each day with Torsemide). Then continue taking it every Monday, Wednesday, and Friday.  *If you need a refill on your cardiac medications before your next appointment, please call your pharmacy*   Lab Work: Your physician recommends that you return for lab work on the same day of your follow-up appointment: BMET  If you have labs (blood work) drawn today and your tests are completely normal, you will receive your results only by: Marland Kitchen MyChart Message (if you have MyChart) OR . A paper copy in the mail If you have any lab test that is abnormal or we need to change your treatment, we will call you to review the results.   Testing/Procedures: Your physician has requested that you have an echocardiogram. Echocardiography is a painless test that uses sound waves to create images of your heart. It provides your doctor with information about the size and shape of your heart and how well your heart's chambers and valves are working. This procedure takes approximately one hour. There are no restrictions for this procedure.   Follow-Up: At [facility], you and your health needs are our priority.  As part of our continuing mission to provide you with exceptional heart care, we have created designated Provider Care Teams.  These Care Teams include your primary Cardiologist (physician) and Advanced Practice Providers (APPs -  Physician Assistants and Nurse Practitioners) who all work together to provide you with the care you need, when you need it.  We recommend signing up for the patient portal called "MyChart".  Sign up information is provided on this After Visit Summary.  MyChart is used to connect with patients for Virtual Visits (Telemedicine).  Patients are able to view lab/test results, encounter notes, upcoming appointments, etc.  Non-urgent messages can be sent to your provider as well.   To  learn more about what you can do with MyChart, go to [email or web address].    Your next appointment:   1-2 week(s)  The format for your next appointment:   In Person  Provider:   You will see one of the following Advanced Practice Providers on your designated Care Team:    Marrianne Mood, PA-C     Other Instructions  Blood pressure: --We recommend upper arm BP cuffs over that of the wrist. --You can always check your device's accuracy against an office model once a year if possible. You can do so by bringing your cuff into the office and notifying the person that rooms you that you would like to check your cuff against our office readings. --Measure your BP at the same time each day. Blood pressure varies often throughout the day with higher readings in the morning. BP may also be lower at home than in the office. Do not measure BP right after you wake up or after exercising. Avoid caffeine, tobacco, and alcohol for 30 minutes before taking a measurement.  --Sit quietly for five minutes in a comfortable position with your legs and ankles uncrossed and back supported. Feet flat on the ground. Have your arm supported and at the level of your heart. Always use the same arm when taking your blood pressure. Place the cuff over bare skin rather than clothing. Each time you measure, take an additional reading if abnormal to ensure accurate by waiting 1-3 minutes after the first reading. --Please bring a BP log into the office. It is helpful to document the time  of each BP reading, as well as any activity or medications taken around the reading. In addition, it is helpful to include heart rate at the time of the BP reading. Daily weights are also encouraged. --Goal BP is 130/80 or lower. If your BP is low but you are not dizzy, this is fine and preferred over BP higher than 130/80. If your BP is less than 100 for the top number and you are dizzy, call the office. If your blood pressure is  consistently elevated with top number above 180 and bottom above 120, this can damage the body. If you have severe increase in your blood pressure or concerning symptoms of severe chest pain, headache with confusion and blurred vision, severe abdominal or back pain, shortness of breath, seizures, or loss of consciousness, go to the emergency department.

## 2021-03-02 ENCOUNTER — Other Ambulatory Visit: Payer: Self-pay

## 2021-03-02 ENCOUNTER — Ambulatory Visit (INDEPENDENT_AMBULATORY_CARE_PROVIDER_SITE_OTHER): Payer: HMO

## 2021-03-02 DIAGNOSIS — I5033 Acute on chronic diastolic (congestive) heart failure: Secondary | ICD-10-CM

## 2021-03-02 DIAGNOSIS — I214 Non-ST elevation (NSTEMI) myocardial infarction: Secondary | ICD-10-CM | POA: Diagnosis not present

## 2021-03-02 DIAGNOSIS — E785 Hyperlipidemia, unspecified: Secondary | ICD-10-CM | POA: Diagnosis not present

## 2021-03-02 DIAGNOSIS — I10 Essential (primary) hypertension: Secondary | ICD-10-CM

## 2021-03-03 LAB — ECHOCARDIOGRAM COMPLETE
AR max vel: 1.34 cm2
AV Area VTI: 1.65 cm2
AV Area mean vel: 1.34 cm2
AV Mean grad: 7.5 mmHg
AV Peak grad: 15.2 mmHg
Ao pk vel: 1.95 m/s
Area-P 1/2: 2.69 cm2
S' Lateral: 3.4 cm

## 2021-03-09 ENCOUNTER — Telehealth: Payer: Self-pay

## 2021-03-09 NOTE — Telephone Encounter (Signed)
Was able to contact pt regarding his recent results of echocardiogram all questions and concerns were answer, no further testing or med changes at this time, pt grateful for call, nothing further at this time, but would like to discuss further at his appt with Mickle Plumb, PA-C this Thursday for more clarification.

## 2021-03-11 ENCOUNTER — Ambulatory Visit (INDEPENDENT_AMBULATORY_CARE_PROVIDER_SITE_OTHER): Payer: HMO | Admitting: Physician Assistant

## 2021-03-11 ENCOUNTER — Encounter: Payer: Self-pay | Admitting: Physician Assistant

## 2021-03-11 ENCOUNTER — Other Ambulatory Visit: Payer: Self-pay

## 2021-03-11 DIAGNOSIS — I5033 Acute on chronic diastolic (congestive) heart failure: Secondary | ICD-10-CM | POA: Diagnosis not present

## 2021-03-11 NOTE — Progress Notes (Signed)
Office Visit    Patient Name: Joseph [name] Date of Encounter: 03/11/2021  Primary Care Provider:  [name]., MD Primary Cardiologist:  Ida Rogue, MD  Chief Complaint    Chief Complaint  Patient presents with  . Follow-up    F/U after cardiac testing    87 year old male with history of CAD s/p four-vessel CABG in 1994, 11/2019 non-STEMI with subsequent LHC 12/17/2019 as outlined below, chronic combined CHF (EF 45-50%),PAD with reported right SFA stenting, bilateral carotid dz with imaging in 2018 showing 1-39% stenosis, Mobitz type I AVB, SVT, NSVT, hyperlipidemia, poorly controlled DM2, hypothyroidism, history of tobacco use via pipe smoking for 15 to 20 years (quit 1982), arthritis of the knees, history of fatigue, shortness of breath, generalized weakness (felt to be his anginal equivalent), and here today for follow-up after recent medication changes and echo.  Past Medical History    Past Medical History:  Diagnosis Date  . CAD (coronary artery disease)    a. s/p CABG;  b. 07/2012 Cath: 3VD including 80 dLCX (med Rx), 4/4 patent grafts.  . Chronic diastolic CHF (congestive heart failure) ([place])    a. 07/2012 Echo: EF 55-60%, no rwma, Gr 1 DD, mild MR;  04/2014 Echo: EF 55-60%, no rwma, mild MR, mildly dil LA.  . Diabetes mellitus, type 2 ([place])   . HTN (hypertension)   . Hyperlipidemia   . Hypothyroidism   . Lower extremity edema    a. 03/2014 amlodipine d/c'd.  Marland Kitchen Spinal stenosis    Past Surgical History:  Procedure Laterality Date  . APPENDECTOMY    . CARDIAC CATHETERIZATION  07/2012   [facility]/no stents  . CHOLECYSTECTOMY    . CORNEAL TRANSPLANT    . CORONARY ARTERY BYPASS GRAFT  1994  . HEMORRHOID SURGERY    . LEFT HEART CATH AND CORS/GRAFTS ANGIOGRAPHY N/A 12/17/2019   Procedure: LEFT HEART CATH AND CORS/GRAFTS ANGIOGRAPHY;  Surgeon: Nelva Bush, MD;  Location: [facility];  Service: Cardiovascular;  Laterality: N/A;  . PROSTATE BIOPSY     . TOE SURGERY     ingrown toe nail    Allergies  No Known Allergies  History of Present Illness    REASE WENCE is a 87 y.o. male with PMH as above.    He has a significant history of CAD, including remote history of CABG x4 in 1994.  He underwent LHC 08/16/2012 in the setting of progressive shortness of breath and fatigue, felt to possibly be his anginal equivalent.  This LHC showed severe three-vessel disease with patent grafts x4.  There is 80% disease of a small to moderate size distal left circumflex.  EF 45 to 50%.  Findings were discussed with interventional cardiology and medical management recommended for the distal left circumflex disease.  06/2012 echo showed EF 55 to 60%, mild MR, no regional wall motion abnormalities, and G1DD.  He was seen 04/2014 with progressive DOE and HCTZ changed to Lasix. He diuresed 12 pounds but still felt SOB after diuresis.  Notes indicated suspicion that SOB may be his anginal equivalent.  Echo 04/2014 showed EF 55 to 60% and mild MR, and mild LAE.  Per EMR, cardiac catheterization was recommended; however, this LHC was declined / deferred by the patient.  Imdur was added.   He was seen 03/17/2016 with decline over the previous several months, including inability to walk greater than 100 feet without having to stop and take a break to rest.  He  was unable to take the trash out or climbing stairs.  His weight was stable at 230 pounds with 3 pillow orthopnea in the setting of GERD.  Cardiac catheterization was recommended and scheduled 03/31/2016; however, this was subsequently cancelled after he was admitted before his LHC to [facility] with AKI and hypoxia, as well as cholecystitis.  He underwent cholecystectomy.  He was seen at follow-up 05/16/2016 and was reportedly doing well with no indication for rescheduling LHC noted by his primary cardiologist due to lack of angina.  He was admitted 11/2019 after report of chest pain in the office that had started 4 days  earlier.  Chest pain had started at rest during the evening and was associated with some belching, which relieved the discomfort.  He had reportedly had a large dinner that night and also was often constipated. However, the CP still continued and recommendation was for admission given Tn of 588 in office. EKG showed NSR with PVCs in a bigeminal pattern.  Subsequent EKG showed NSR, Mobitz type I, AVB, LAD, LBBB.  Telemetry was significant for Mobitz type I second-degree AV block.  Plan on admission was for cardiac catheterization 2/2 non-STEMI.    LHC 12/17/2019 was performed and showed severe CAD.  Cath showed 80% dLMCA, 100% pLAD, and diffuse LCx/OM disease.  90% MRCA stenosis and CTO of the RPDA was also present.  He had a patent LIMA-D3 with backfilling of the mid/distal LAD. Also noted was patent SVG-D1-D2, SVG to OM 1-OM 2, and SVG-RPDA-RPL.  Mild to moderately elevated LVEDP and moderately reduced LVSF was also noted.  Subsequent echo showed EF 50 to 55%/low normal function with mild LVH, moderate hypokinesis of the left ventricle and mid anterior septal wall, G1DD, LV RWMA, mildly reduced RVSF, mild LAE, mild MR, trivial TR, mild to moderate AV sclerosis/calcification without evidence of aortic stenosis, and aortic dilation/dilation of the aortic root and ascending aorta measuring 38 mm.  Recommendations were for medical therapy with DAPT (ASA and clopidogrel) for at least 12 months.    Given his telemetry and EKG findings, EP consultation of AV block was recommended, especially if ongoing symptoms.  He subsequently underwent cardiac monitoring as below with NSR (average heart rate 62 bpm), 4 runs of VT (fastest 4 beats & max rate 222 bpm; longest 15 beats and average rate 120 bpm), 3 runs of SVT (fastest 5 beats, max rate 135 bpm; longest 6 beats, average rate 100 bpm), second-degree AV block Mobitz type I, isolated PACs, atrial couplets/triplets, occasional PVCs, rare ventricular couplets and triplets,  and ventricular bigeminy and trigeminy.  Seen 05/11/2020 and under a significant amount of personal and financial stress.  He had an episode of chest pain that was identical to that which occurred prior to his [facility] 12/17/2019.  CP was further described as occurring while he was trying to get the sheets on his mattress.  He reports that the CP lasted only minutes and was less severe but otherwise identical to before his cath as above.  Following resolution of his CP, he subsequently felt severe dizziness for the next 2 days but without falls or LOC.  He had chronic and unchanged LEE. He reported significant stress due to taking care of his wife.  He did not have a regular exercise routine.  He denied any further chest pain/dizziness since his initial event.  No signs or symptoms of bleeding.  He reported medication compliance.  He was seen the following month and noted to be volume up.  He was changed to torsemide on a Monday Wednesday Friday basis.  Seen 06/2020 by EP with report of weakness in his legs with walking short distances.  He also reported dizziness in the shower.  No chest pain.  He had recurrent peripheral edema and was taking his torsemide on an as-needed basis.  Orthostatic vital signs were not impressive when compared with a couple of months prior.  He reported episodic dizziness with unclear mechanism.  It was requested that he monitor BP with these episodes.  Also requested was that he start taking his diuretic on a predictable basis, such as Monday Wednesday Friday.  Seen 02/23/2021 with report that he felt dizzy 1 day the previous week while pulling something up in the yard.  He reported a feeling of heaviness in the stomach and upper epigastric area, further described as a discomfort or chest tightness.  He took 1 sublingual nitro with improvement in symptoms.  For the rest of the day, he felt dizzy with symptoms from 2 PM to 9 PM.  The symptoms were similar to his previous non-STEMI but not  as severe in intensity.  He reported fatigue and low motivation in the setting of his wife's recent passing 12/2020.  He reported drinking 1 glass of water the day of his visit.  He was not taking his torsemide Monday/Wednesday/Friday but as needed.  Recommendations were to restart torsemide schedule on Monday Wednesday Friday after 3 straight days of torsemide 20 mg daily.  Repeat BMET in 1 to 2 weeks.  Echo was ordered given his symptoms and showed mildly reduced pump function at 45 to 50%, G1 DD.   Today, 03/11/2021, he returns to clinic and notes that he is overall doing better when compared with his prior clinic visit.  He reports that he urinated a lot within the first 3 days after recommendation for 3 straight days of torsemide 20 mg daily, followed by a restart of torsemide every Monday, Wednesday, and Friday.  He denies any further dizziness and reports significantly improved weakness and motivation.  No further epigastric pain or chest pain.  Overall, he feels significantly improved.  He reports that he has been for the most part taking his torsemide every day, rather than every other day.  He did not take his torsemide on Sunday of this past week, due to being in charge and not wanting to urinate throughout the entire service.  He reports drinking 4 glasses of water per day.  He lost 3 to 4 pounds within the first few days on his home scale.  He reports stable DOE, as he is not able to walk very far before sitting down, and which he mainly attributes to deconditioning and age. No SOB at rest. Echo results reviewed with pt understanding. Overall, he is very satisfied with his improvement in his sx.   Home Medications    Prior to Admission medications   Medication Sig Start Date End Date Taking? Authorizing Provider  aspirin EC 81 MG tablet Take 1 tablet (81 mg total) by mouth daily. 03/17/16  Yes Dunn, Areta Haber, PA-C  atorvastatin (LIPITOR) 80 MG tablet Take 1 tablet (80 mg total) by mouth every  evening. 01/20/20  Yes Minna Merritts, MD  Cholecalciferol 125 MCG (5000 UT) capsule Take 5,000 Units by mouth at bedtime.   Yes [provider]  clobetasol cream (TEMOVATE) 2.70 % Apply 1 application topically 2 (two) times daily. As needed to hands and elbows   Yes [provider]  clopidogrel (PLAVIX) 75 MG tablet Take 1 tablet (75 mg total) by mouth daily. 01/20/20  Yes Gollan, Kathlene November, MD  Continuous Blood Gluc Sensor (FREESTYLE LIBRE 14 DAY SENSOR) MISC USE AS DIRECTED EVERY 14 DAYS 01/16/20  Yes [name]., MD  hydrocortisone 2.5 % cream Apply 1 application topically 2 (two) times daily. As needed to face, crease, groin and butt 12/26/19  Yes [name]., MD  insulin aspart (NOVOLOG) 100 UNIT/ML injection Inject 0-9 Units into the skin 3 (three) times daily with meals. Patient taking differently: Inject into the skin 3 (three) times daily with meals. 10 am, 15 lunch, 20 pm 03/28/16  Yes Demetrios Loll, MD  insulin glargine (LANTUS) 100 UNIT/ML injection Inject 0.68 mLs (68 Units total) into the skin at bedtime. Patient taking differently: Inject 65 Units into the skin at bedtime.  03/28/16  Yes Demetrios Loll, MD  ketoconazole (NIZORAL) 2 % cream Apply 1 application topically 2 (two) times daily as needed for irritation. To groin, fave and buttocks. 12/26/19  Yes [name]., MD  levothyroxine (SYNTHROID, LEVOTHROID) 50 MCG tablet TAKE 1 TABLET BY MOUTH ONCE DAILY BEFORE BREAKFAST Patient taking differently: Take 25 mcg by mouth daily before breakfast.  01/01/19  Yes [name]., MD  losartan (COZAAR) 100 MG tablet Take 1 tablet (100 mg total) by mouth at bedtime. 01/20/20  Yes Gollan, Kathlene November, MD  Magnesium 250 MG TABS Take 250 mg by mouth as needed (swelling). Take when you take your Torsemide.   Yes [provider]  mometasone (ELOCON) 0.1 % ointment Apply topically daily. As needed to scalp and ears   Yes [provider]   nitroGLYCERIN (NITROSTAT) 0.4 MG SL tablet Place 1 tablet (0.4 mg total) under the tongue every 5 (five) minutes as needed for chest pain. 12/13/19  Yes Minna Merritts, MD  omeprazole (PRILOSEC) 20 MG capsule Take 1 capsule by mouth once daily Patient taking differently: Take 20 mg by mouth at bedtime.  09/17/19  Yes [name]., MD  potassium chloride (KLOR-CON) 10 MEQ tablet Take 1 tablet (10 mEq total) by mouth daily. 01/20/20  Yes Minna Merritts, MD  torsemide (DEMADEX) 20 MG tablet Take 1 tablet (20 mg total) by mouth as needed (As needed for swelling and shortness of breath). Also take extra potassium 10 meq with this. 01/20/20 04/19/20  Minna Merritts, MD    Review of Systems    He reports complete resolution of the previous epigastric / lower chest tightness and dizziness. He has improved weakness and motivation. He denies palpitations, dyspnea, pnd, orthopnea, n, v, dizziness, syncope, weight gain, or early satiety.   All other systems reviewed and are otherwise negative except as noted above.  Physical Exam    VS:  BP (!) 146/78 (BP Location: Left Arm, Patient Position: Sitting, Cuff Size: Large)   Pulse 78   Ht 5\' 7"  (1.702 m)   Wt 217 lb (98.4 kg)   SpO2 97%   BMI 33.99 kg/m  , BMI Body mass index is 33.99 kg/m. GEN: Elderly male, in no acute distress. HEENT: normal. Neck: Supple, no JVD, carotid bruits, or masses. Cardiac: RRR, 1/6 systolic murmur. No rubs, or gallops. No clubbing, cyanosis. Significantly improved lower extremity swelling with only mild bilateral LEE.  Radials/DP/PT 2+ and equal bilaterally.  Respiratory:  Respirations regular and unlabored, clear to auscultation bilaterally. GI: improved - less firm, very slightly distended, BS +  x 4. MS: no deformity or atrophy. Skin: warm and dry, no rash. Neuro:  Strength and sensation are intact. Psych: Normal affect.  Accessory Clinical Findings    ECG personally reviewed by me today -no  EKG.  VITALS Reviewed today   Temp Readings from Last 3 Encounters:  01/05/21 97.6 F (36.4 C) (Oral)  10/21/20 98 F (36.7 C) (Oral)  10/01/20 98.4 F (36.9 C) (Oral)   BP Readings from Last 3 Encounters:  03/11/21 (!) 146/78  02/23/21 (!) 152/82  01/21/21 (!) 185/87   Pulse Readings from Last 3 Encounters:  03/11/21 78  02/23/21 91  01/21/21 97    Wt Readings from Last 3 Encounters:  03/11/21 217 lb (98.4 kg)  02/23/21 218 lb (98.9 kg)  01/21/21 220 lb (99.8 kg)     LABS  reviewed today   Lab Results  Component Value Date   WBC 5.6 05/11/2020   HGB 13.8 05/11/2020   HCT 40.6 05/11/2020   MCV 89 05/11/2020   PLT 222 05/11/2020   Lab Results  Component Value Date   CREATININE 1.16 06/22/2020   BUN 16 06/22/2020   NA 140 06/22/2020   K 4.4 06/22/2020   CL 106 06/22/2020   CO2 29 06/22/2020   Lab Results  Component Value Date   ALT 21 12/16/2019   AST 24 12/16/2019   ALKPHOS 47 12/16/2019   BILITOT 0.6 12/16/2019   Lab Results  Component Value Date   CHOL 121 12/17/2019   HDL 34 (L) 12/17/2019   LDLCALC 67 12/17/2019   TRIG 98 12/17/2019   CHOLHDL 3.6 12/17/2019    Lab Results  Component Value Date   HGBA1C 9.4 09/15/2020   Lab Results  Component Value Date   TSH 2.220 05/11/2020     STUDIES/PROCEDURES reviewed today   Echo 03/02/21 1. Left ventricular ejection fraction, by estimation, is 45 to 50%. The  left ventricle has mildly decreased function. The left ventricle has no  regional wall motion abnormalities. There is mild left ventricular  hypertrophy. Left ventricular diastolic  parameters are consistent with Grade I diastolic dysfunction (impaired  relaxation).  2. Right ventricular systolic function is normal. The right ventricular  size is normal.  3. The mitral valve is normal in structure. No evidence of mitral valve  regurgitation.  4. The aortic valve is tricuspid. Aortic valve regurgitation is not  visualized. Mild to  moderate aortic valve sclerosis/calcification is  present, without any evidence of aortic stenosis.   Cardiac monitor 01/15/20 Normal sinus rhythm avg HR of 62 bpm. 4 Ventricular Tachycardia runs occurred, the run with the fastest interval lasting 4 beats with a max rate of 222 bpm, the longest lasting 15 beats with an avg rate of 120 bpm.  3 Supraventricular Tachycardia runs occurred, the run with the fastest interval lasting 5 beats with a max rate of 135 bpm, the longest lasting 6 beats with an avg rate of 100 bpm.  Second Degree AV Block-Mobitz I (Wenckebach) was present.  Isolated SVEs were rare (<1.0%), SVE Couplets were rare (<1.0%), and SVE Triplets were rare (<1.0%). Isolated VEs were occasional (3.5%, 39247), VE Couplets were rare (<1.0%, 1522), and VE Triplets were rare (<1.0%, 10). Ventricular Bigeminy and Trigeminy were present. No patient triggered events recorded.  Echocardiogram 12/16/2018: 1. Left ventricular ejection fraction, by visual estimation, is 50 to  55%. The left ventricle has low normal function. There is mildly increased  left ventricular hypertrophy.  2. Moderate hypokinesis of the  left ventricular, mid anteroseptal wall.  3. Left ventricular diastolic parameters are consistent with Grade I  diastolic dysfunction (impaired relaxation).  4. The left ventricle demonstrates regional wall motion abnormalities.  5. Global right ventricle has mildly reduced systolic function.The right  ventricular size is normal. No increase in right ventricular wall  thickness.  6. Left atrial size was mildly dilated.  7. Right atrial size was normal.  8. The mitral valve is normal in structure. Mild mitral valve  regurgitation. No evidence of mitral stenosis.  9. The tricuspid valve is normal in structure.  10. The tricuspid valve is normal in structure. Tricuspid valve  regurgitation is trivial.  11. The aortic valve is tricuspid. Aortic valve regurgitation is not   visualized. Mild to moderate aortic valve sclerosis/calcification without  any evidence of aortic stenosis.  12. The pulmonic valve was not well visualized. Pulmonic valve  regurgitation is not visualized.  13. Aortic dilatation noted.  14. There is mild dilatation of the aortic root and of the ascending aorta  measuring 38 mm.  15. TR signal is inadequate for assessing pulmonary artery systolic  pressure.  16. The inferior vena cava is normal in size with greater than 50%  respiratory variability, suggesting right atrial pressure of 3 mmHg.  17. The interatrial septum was not well visualized.   LHC 12/17/2019  The left ventricular ejection fraction is 35-45% by visual estimate. Conclusions: 1. Severe native CAD, including 80% distal LMCA, 100% proximal LAD and diffuse LCx/OM disease. 90% mid RCA stenosis and CTO of rPDA also present. 2. Patent LIMA->D3 with backfilling of the mid/distal LADl. 3. Patent SVG-D1-D2, SVG-OM1-OM2, and SVG-rPDA-rPL. 4. Mildly to moderately elevated LVEDP. 5. At least moderately reduced left ventricular systolic function. Recommendations: 1. Medical therapy; recommend up to 12 months of DAPT with ASA and clopidogrel, as tolerated. 2. Aggressive secondary prevention of coronary artery disease. 3. Obtain echocardiogram to better assess LVEF.Marland Kitchen 4. Monitor on telemetry. Consider EP consultation of AV block persists, particularly if the patient is symptoms.  US Carotid 2018 Notes recorded by Minna Merritts, MD on 03/13/2017 at 6:28 PM EDT Carotid ultrasound Less than 39% stenosis bilaterally Stable  repeat ultrasound only as needed Dr. Rockey Situ, MD  ---  Orthostatics 05/11/20 Orthostatic Vital Signs:    BP  HR  Lying:  181/66  51  Sitting:  166/71  80 Standing:  124/70  102 2 minutes: 157/65  157/65  Assessment & Plan    AOC combined heart failure (low normal EF 45-50%) --Sx and volume status significantly improved from previous visit,  though as we discussed today, suspect he is still holding on to some fluid. Still slightly volume up today, though significantly improved from previous visits. Updated echo reviewed with slightly reduced from previous 50 - 55% to now EF 45-50%, though without RWMA. Mild LVH and G1DD appreciated with recommendation for BP control and fluid restriction/salt restriction reviewed. Discussed strong recommendation to escalate GDMT as directly below.  Still volume up= Additional torsemide 20mg  qd x3 days then torsemide 20mg  only on MWF thereafter.   Escalation of GDMT at RTC  At RTC, transition from Losartan to [facility].   No BB = discontinued at previous visits 2/2 AVB.   At RTC, addition of Spironolactone +/-  Farxiga.  Discussed daily wts and BP checks. Reviewed salt and fluid intake.   Repeat echo 3-6 months after optimization of GDMT.  CAD s/p CABG x4 and non-ST elevation myocardial infarction --No further episodes of CP with  restart of diuresis.  Significant history of CAD as outlined above and HPI.  Most recent [facility] 1/26 as above with recommendation for medical management with DAPT.  For dizziness, he was evaluated by EP with recommendation for increased diuresis. Sx improved with diuresis. Echo did show slightly reduced EF and NRWMA. Recommendation is for ongoing medical management / escalation of GDMT, risk factor modification, and strict volume /BP control moving forward with pt understanding.   At RTC, recommend escalation of GDMT.  Transition from Losartan to [facility].   Add Spironolactone and Farxiga per GDMT.   Continue ASA 81 mg and Plavix  Continue PRN SL nitro for CP.   No BB 2/2 AVB as below.   Continue statin. LDL control.   Call the office or go to the ED if any further CP. Call the office if elevated BP at home.  Repeat limited echo in 3-6 months after optimized GDMT.  History of Mobitz I AVB, Bradycardia History of NSVT, pSVT, AT Frequent PVCs / Ventricular  Couplets  --Reports resolution of dizziness and upper epigastric pain / CP with diuresis.  History of Mobitz I AVB, bradycardia, AT, SVT, and NSVT in the past. Previous monitor as above. BB discontinued due to AVB at previous visit with return of NSVT and SVT, as well as PVCs.  EP evaluated with recommendation for increased diuresis. Volume status significantly improved on recheck today.   Orthostatic hypotension --Improved sx with slow position changes. Reports no further episodes since improved volume status.  HTN --BP still elevated but improved with diuresis and suspect further improvement with MWF diuresis schedule discussed as above. Discussed transition to Entresto at RTC if still room in BP at that time or if BP remains elevated at home and with pt understanding. Also at RTC, consider addition of Spironolactone.  HLD --Continue statin. LDL goal <70.  PAD s/p R SFA stenting Bilateral carotid dz --Denies s/sx of claudication s/p stenting.  No amaurosis fugax. Continue high intensity statin and DAPT with ASA and Plavix. Glycemic control recommended, in addition to LDL goal <70. HR and BP control recommended.  DM2 --Strict glycemic control recommended for risk factor modification, as well as activity as tolerated.   Hypothyroidism --Continue medication per PCP.  Medication changes: Torsemide 20mg  x3 days then ongoing torsemide 20mg  on MWF.  Labs ordered: BMET  Studies / Imaging ordered: None Future considerations: Stop Losartan and start Entresto. Add Spironolactone. Add [place]. Disposition: RTC 3-6 months, sooner if BP remains elevated above 140/80 (guidelines given in AVS and highlighted)  Arvil Chaco, PA-C 03/11/2021

## 2021-03-11 NOTE — Patient Instructions (Addendum)
Medication Instructions:  Your physician recommends that you continue on your current medications as directed. Please refer to the Current Medication list given to you today.  *If you need a refill on your cardiac medications before your next appointment, please call your pharmacy*   Lab Work: Your physician recommends that you have lab work TODAY: BMET  If you have labs (blood work) drawn today and your tests are completely normal, you will receive your results only by: Marland Kitchen MyChart Message (if you have MyChart) OR . A paper copy in the mail If you have any lab test that is abnormal or we need to change your treatment, we will call you to review the results.   Testing/Procedures: None ordered   Follow-Up: At [facility], you and your health needs are our priority.  As part of our continuing mission to provide you with exceptional heart care, we have created designated Provider Care Teams.  These Care Teams include your primary Cardiologist (physician) and Advanced Practice Providers (APPs -  Physician Assistants and Nurse Practitioners) who all work together to provide you with the care you need, when you need it.  We recommend signing up for the patient portal called "MyChart".  Sign up information is provided on this After Visit Summary.  MyChart is used to connect with patients for Virtual Visits (Telemedicine).  Patients are able to view lab/test results, encounter notes, upcoming appointments, etc.  Non-urgent messages can be sent to your provider as well.   To learn more about what you can do with MyChart, go to [email or web address].    Your next appointment:   6 month(s)  The format for your next appointment:   In Person  Provider:   You may see Ida Rogue, MD or one of the following Advanced Practice Providers on your designated Care Team:     Marrianne Mood, PA-C   Other Instructions  Please monitor your blood pressure at home:  **Top number under 140 and  bottom number 80**

## 2021-03-12 LAB — BASIC METABOLIC PANEL
BUN/Creatinine Ratio: 15 (ref 10–24)
BUN: 16 mg/dL (ref 10–36)
CO2: 22 mmol/L (ref 20–29)
Calcium: 8.8 mg/dL (ref 8.6–10.2)
Chloride: 102 mmol/L (ref 96–106)
Creatinine, Ser: 1.08 mg/dL (ref 0.76–1.27)
Glucose: 280 mg/dL — ABNORMAL HIGH (ref 65–99)
Potassium: 4.5 mmol/L (ref 3.5–5.2)
Sodium: 140 mmol/L (ref 134–144)
eGFR: 64 mL/min/{1.73_m2} (ref >59)

## 2021-03-15 ENCOUNTER — Telehealth: Payer: Self-pay | Admitting: *Deleted

## 2021-03-15 ENCOUNTER — Other Ambulatory Visit: Payer: Self-pay | Admitting: Cardiovascular Disease

## 2021-03-15 NOTE — Telephone Encounter (Signed)
Spoke to pt. Notified of lab results and provider's recc.  Pt voiced understanding, he will resume Torsemide 20mg  with KCL 87mEq every Monday, Wednesday, Friday.  Med list has been updated.  Pt has no further questions at this time.

## 2021-03-15 NOTE — Telephone Encounter (Signed)
Please advise alternative suggestion Atorvastatin not of pt's preferred formulary. Please see prompt when submitting Rx refill via Epic.

## 2021-03-15 NOTE — Telephone Encounter (Signed)
atorvastatin (LIPITOR) 80 MG tablet [Pharmacy Med Name: Atorvastatin Calcium 80 MG Oral Tablet] is not on the preferred formulary for the patient's insurance plan. Below are alternatives which are likely to be more affordable. Do not assume that every medication presented is a clinically appropriate alternative.  These alternatives are medications that are in the same pharmaceutical subclass (HMG CoA Reductase Inhibitors) as the ordered medication and are on formulary for the patient's insurance plan.

## 2021-03-15 NOTE — Telephone Encounter (Signed)
-----   Message from Arvil Chaco, PA-C sent at 03/12/2021  1:54 PM EDT ----- Kidney function stable. Electrolytes at goal.  No changes from clinic recommendations. After his 3rd day in a row of torsemide 20mg  with KCL 53mEq, he should drop back down to his regular schedule: --Torsemide 20mg  every Monday, Wednesday, and Friday (as discussed in clinic). --Potassium supplement 61mEq on Monday, Wednesday, and Friday as well.   Can we please update his medication list --> he has been on this schedule since earlier this month.   Sugar is on the high side - recommend follow-up with PCP regarding his sugar.

## 2021-03-16 MED ORDER — ATORVASTATIN CALCIUM 80 MG PO TABS: 80.0000 mg | ORAL_TABLET | Freq: Every day | ORAL | 3 refills | Status: DC

## 2021-03-16 NOTE — Telephone Encounter (Signed)
Called patients pharmacy and they were able to push the Atorvastatin through.

## 2021-03-23 ENCOUNTER — Other Ambulatory Visit: Payer: Self-pay

## 2021-03-23 ENCOUNTER — Ambulatory Visit (INDEPENDENT_AMBULATORY_CARE_PROVIDER_SITE_OTHER): Payer: HMO | Admitting: Family Medicine

## 2021-03-23 ENCOUNTER — Encounter: Payer: Self-pay | Admitting: Family Medicine

## 2021-03-23 ENCOUNTER — Other Ambulatory Visit: Payer: Self-pay | Admitting: Family Medicine

## 2021-03-23 VITALS — BP 150/71 | HR 86 | Temp 98.1°F | Resp 16 | Ht 67.0 in | Wt 219.0 lb

## 2021-03-23 DIAGNOSIS — L409 Psoriasis, unspecified: Secondary | ICD-10-CM

## 2021-03-23 DIAGNOSIS — E1151 Type 2 diabetes mellitus with diabetic peripheral angiopathy without gangrene: Secondary | ICD-10-CM | POA: Diagnosis not present

## 2021-03-23 DIAGNOSIS — E6609 Other obesity due to excess calories: Secondary | ICD-10-CM | POA: Diagnosis not present

## 2021-03-23 DIAGNOSIS — G629 Polyneuropathy, unspecified: Secondary | ICD-10-CM | POA: Diagnosis not present

## 2021-03-23 DIAGNOSIS — I25708 Atherosclerosis of coronary artery bypass graft(s), unspecified, with other forms of angina pectoris: Secondary | ICD-10-CM

## 2021-03-23 DIAGNOSIS — Z794 Long term (current) use of insulin: Secondary | ICD-10-CM | POA: Diagnosis not present

## 2021-03-23 DIAGNOSIS — I5033 Acute on chronic diastolic (congestive) heart failure: Secondary | ICD-10-CM | POA: Diagnosis not present

## 2021-03-23 DIAGNOSIS — Z6834 Body mass index (BMI) 34.0-34.9, adult: Secondary | ICD-10-CM

## 2021-03-23 NOTE — Telephone Encounter (Signed)
Requested Prescriptions  Pending Prescriptions Disp Refills  . hydrocortisone 2.5 % cream [Pharmacy Med Name: Hydrocortisone 2.5 % External Cream] 28 g 0    Sig: APPLY TOPICALLY TWICE DAILY AS NEEDED TO FACE, CREASE, GROIN AND BUTT     Off-Protocol Failed - 03/23/2021  5:17 PM      Failed - Medication not assigned to a protocol, review manually.      Passed - Valid encounter within last 12 months    Recent Outpatient Visits          Today    [facility] [name]., MD   2 months ago Chronic bilateral low back pain without sciatica   [facility] [name]., MD   2 months ago Skin lesion of left arm   [facility] [name]., MD   5 months ago Type 2 diabetes mellitus with diabetic peripheral angiopathy without gangrene, with long-term current use of insulin [facility])   [facility] [name]., MD   5 months ago Acute pain of right knee   [facility] Chrismon, Vickki Muff, PA-C      Future Appointments            In 3 months [name]., MD [facility] [place]

## 2021-03-23 NOTE — Progress Notes (Signed)
Established patient visit   Patient: Joseph [name]   DOB: May 29, 1925   87 y.o. Male  MRN: [id number] Visit Date: 03/23/2021  Today's healthcare provider: Wilhemena Durie, MD   Chief Complaint  Patient presents with  . Hypertension   Subjective    HPI  Patient comes in today for follow-up.  He has been feeling well.  He is taking his medications as prescribed.  He is having no problems, no chest pain shortness of breath dyspnea on exertion or syncope or presyncope. Hypertension, follow-up  BP Readings from Last 3 Encounters:  03/23/21 (!) 150/71  03/11/21 (!) 146/78  02/23/21 (!) 152/82   Wt Readings from Last 3 Encounters:  03/23/21 219 lb (99.3 kg)  03/11/21 217 lb (98.4 kg)  02/23/21 218 lb (98.9 kg)     He was last seen for hypertension 1 months ago.  BP at that visit was 185/87. Management since that visit includes no medication changes. Continue to monitor at home.   He reports good compliance with treatment. He is not having side effects.  He is following a Regular diet. He is not exercising. He does not smoke.  Use of agents associated with hypertension: none.   Outside blood pressures are checked at home. Patient reports that BP averages in the 140s/70s.  Symptoms: No chest pain No chest pressure  No palpitations No syncope  No dyspnea No orthopnea  No paroxysmal nocturnal dyspnea Yes lower extremity edema   Pertinent labs: Lab Results  Component Value Date   CHOL 121 12/17/2019   HDL 34 (L) 12/17/2019   LDLCALC 67 12/17/2019   TRIG 98 12/17/2019   CHOLHDL 3.6 12/17/2019   Lab Results  Component Value Date   NA 140 03/11/2021   K 4.5 03/11/2021   CREATININE 1.08 03/11/2021   GFRNONAA 54 (L) 06/22/2020   GFRAA >60 06/22/2020   GLUCOSE 280 (H) 03/11/2021     The ASCVD Risk score (Goff DC Jr., et al., 2013) failed to calculate for the following reasons:   The 2013 ASCVD risk score is only valid for ages 43 to 49   The patient has a  prior MI or stroke diagnosis         Medications: Outpatient Medications Prior to Visit  Medication Sig  . aspirin EC 81 MG tablet Take 1 tablet (81 mg total) by mouth daily.  Marland Kitchen atorvastatin (LIPITOR) 80 MG tablet Take 1 tablet (80 mg total) by mouth daily.  . Cholecalciferol 125 MCG (5000 UT) capsule Take 5,000 Units by mouth at bedtime.  . clobetasol cream (TEMOVATE) 9.62 % Apply 1 application topically 2 (two) times daily. As needed to hands and elbows  . clopidogrel (PLAVIX) 75 MG tablet Take 1 tablet by mouth once daily  . Continuous Blood Gluc Sensor (FREESTYLE LIBRE 14 DAY SENSOR) MISC USE AS DIRECTED EVERY 14 DAYS  . hydrocortisone 2.5 % cream APPLY TOPICALLY TWICE DAILY AS NEEDED TO FACE, CREASE, GROIN, AND BUTT  . insulin aspart (NOVOLOG) 100 UNIT/ML injection Inject into the skin 3 (three) times daily before meals. Inject 28 units in the morning / 18 units at 12:00pm / and 18 units at night time.  . insulin glargine (LANTUS) 100 UNIT/ML injection Inject 60 Units into the skin daily.  Marland Kitchen ketoconazole (NIZORAL) 2 % cream APPLY TOPICALLY TWICE DAILY AS NEEDED FOR IRRITATION TO GROIN, FACE, AND BUTTOCKS  . levothyroxine (SYNTHROID) 50 MCG tablet TAKE 1 TABLET BY MOUTH ONCE DAILY BEFORE  BREAKFAST  . losartan (COZAAR) 100 MG tablet Take 1 tablet by mouth once daily  . Magnesium 250 MG TABS Take 250 mg by mouth as needed (swelling). Take when you take your Torsemide.  . mometasone (ELOCON) 0.1 % ointment Apply topically daily. As needed to scalp and ears  . nitroGLYCERIN (NITROSTAT) 0.4 MG SL tablet Place 1 tablet (0.4 mg total) under the tongue every 5 (five) minutes as needed for chest pain.  Marland Kitchen omeprazole (PRILOSEC) 20 MG capsule Take 1 capsule by mouth once daily  . potassium chloride (MICRO-K) 10 MEQ CR capsule Take 10 mEq by mouth every Monday, Wednesday, and Friday. Takes 1 tablet as needed with furosemide  . torsemide (DEMADEX) 20 MG tablet Take 20 mg by mouth every Monday,  Wednesday, and Friday.   No facility-administered medications prior to visit.    Review of Systems  Constitutional: Negative for activity change and fatigue.  Respiratory: Negative for cough and shortness of breath.         Objective    BP (!) 150/71   Pulse 86   Temp 98.1 F (36.7 C)   Resp 16   Ht 5\' 7"  (1.702 m)   Wt 219 lb (99.3 kg)   BMI 34.30 kg/m  BP Readings from Last 3 Encounters:  03/23/21 (!) 150/71  03/11/21 (!) 146/78  02/23/21 (!) 152/82   Wt Readings from Last 3 Encounters:  03/23/21 219 lb (99.3 kg)  03/11/21 217 lb (98.4 kg)  02/23/21 218 lb (98.9 kg)       Physical Exam Vitals reviewed.  Constitutional:      Appearance: He is obese.  HENT:     Head: Normocephalic and atraumatic.     Right Ear: External ear normal.     Left Ear: External ear normal.  Eyes:     General: No scleral icterus. Cardiovascular:     Rate and Rhythm: Normal rate and regular rhythm.     Heart sounds: Normal heart sounds.  Pulmonary:     Breath sounds: Normal breath sounds.  Musculoskeletal:     Comments: 1+ lower extremity edema  Skin:    General: Skin is warm and dry.  Neurological:     General: No focal deficit present.     Mental Status: He is alert and oriented to person, place, and time.  Psychiatric:        Mood and Affect: Mood normal.        Behavior: Behavior normal.        Thought Content: Thought content normal.        Judgment: Judgment normal.       No results found for any visits on 03/23/21.  Assessment & Plan     1. Atherosclerosis of coronary artery bypass graft of native heart with stable angina pectoris ([place]) On maximal medical therapy including atorvastatin glycerin and torsemide  2. Type 2 diabetes mellitus with diabetic peripheral angiopathy without gangrene, with long-term current use of insulin (HCC) On insulin Lantus  3. Acute on chronic diastolic heart failure (HCC) Niccoli stable.  Weigh daily.  4. Class 1 obesity due to  excess calories with serious comorbidity and body mass index (BMI) of 34.0 to 34.9 in adult   5. Neuropathy    No follow-ups on file.      I, Wilhemena Durie, MD, have reviewed all documentation for this visit. The documentation on 03/27/21 for the exam, diagnosis, procedures, and orders are all accurate and complete.  Seleen Walter Cranford Mon, MD  [facility] [phone] (phone) [phone] (fax)  [place]

## 2021-04-01 ENCOUNTER — Other Ambulatory Visit: Payer: Self-pay | Admitting: Cardiovascular Disease

## 2021-04-30 DIAGNOSIS — Z794 Long term (current) use of insulin: Secondary | ICD-10-CM | POA: Diagnosis not present

## 2021-04-30 DIAGNOSIS — E1169 Type 2 diabetes mellitus with other specified complication: Secondary | ICD-10-CM | POA: Diagnosis not present

## 2021-04-30 DIAGNOSIS — R809 Proteinuria, unspecified: Secondary | ICD-10-CM | POA: Diagnosis not present

## 2021-04-30 DIAGNOSIS — E1129 Type 2 diabetes mellitus with other diabetic kidney complication: Secondary | ICD-10-CM | POA: Diagnosis not present

## 2021-04-30 DIAGNOSIS — E1159 Type 2 diabetes mellitus with other circulatory complications: Secondary | ICD-10-CM | POA: Diagnosis not present

## 2021-04-30 DIAGNOSIS — E785 Hyperlipidemia, unspecified: Secondary | ICD-10-CM | POA: Diagnosis not present

## 2021-04-30 DIAGNOSIS — E1142 Type 2 diabetes mellitus with diabetic polyneuropathy: Secondary | ICD-10-CM | POA: Diagnosis not present

## 2021-04-30 DIAGNOSIS — E11649 Type 2 diabetes mellitus with hypoglycemia without coma: Secondary | ICD-10-CM | POA: Diagnosis not present

## 2021-05-03 ENCOUNTER — Other Ambulatory Visit: Payer: Self-pay | Admitting: Cardiovascular Disease

## 2021-05-03 ENCOUNTER — Other Ambulatory Visit: Payer: Self-pay | Admitting: Family Medicine

## 2021-06-14 ENCOUNTER — Telehealth: Payer: Self-pay

## 2021-06-14 NOTE — Telephone Encounter (Signed)
Copied from [place] [id number]. Topic: General - Other >> Jun 14, 2021  9:45 AM Loma Boston wrote: Reason for CRM: Pt is needing a FU call from Dr Darnell Level nurse, conncerned as had some congestion. Primarly just a.m.'s but blowing nose a lot and have random nose bleeds last 2 mo, had a bad nose bleed in church yesterday, states "gusher" 30 min or so to stop, has had in middle of night, and quite frequent, should he stop taking the daily aspirin. FU at [phone]

## 2021-06-14 NOTE — Telephone Encounter (Signed)
Patient was advised. Patient states he has had a cold for over one week. He has symptoms of sinus and chest congestion. Patient has concerns of chest congestion turning into pneumonia. Please advise?

## 2021-06-14 NOTE — Telephone Encounter (Signed)
Please advise 

## 2021-06-17 ENCOUNTER — Telehealth: Payer: HMO | Admitting: Family Medicine

## 2021-06-23 ENCOUNTER — Ambulatory Visit: Payer: Self-pay | Admitting: Family Medicine

## 2021-06-28 ENCOUNTER — Other Ambulatory Visit: Payer: Self-pay | Admitting: Cardiovascular Disease

## 2021-06-30 ENCOUNTER — Other Ambulatory Visit: Payer: Self-pay

## 2021-06-30 ENCOUNTER — Ambulatory Visit (INDEPENDENT_AMBULATORY_CARE_PROVIDER_SITE_OTHER): Payer: HMO | Admitting: Family Medicine

## 2021-06-30 ENCOUNTER — Encounter: Payer: Self-pay | Admitting: Family Medicine

## 2021-06-30 VITALS — BP 166/77 | HR 64 | Temp 97.7°F | Resp 18 | Ht 67.0 in | Wt 224.0 lb

## 2021-06-30 DIAGNOSIS — G629 Polyneuropathy, unspecified: Secondary | ICD-10-CM

## 2021-06-30 DIAGNOSIS — M47816 Spondylosis without myelopathy or radiculopathy, lumbar region: Secondary | ICD-10-CM

## 2021-06-30 DIAGNOSIS — I5033 Acute on chronic diastolic (congestive) heart failure: Secondary | ICD-10-CM

## 2021-06-30 DIAGNOSIS — M545 Low back pain, unspecified: Secondary | ICD-10-CM | POA: Diagnosis not present

## 2021-06-30 DIAGNOSIS — I25708 Atherosclerosis of coronary artery bypass graft(s), unspecified, with other forms of angina pectoris: Secondary | ICD-10-CM | POA: Diagnosis not present

## 2021-06-30 DIAGNOSIS — Z951 Presence of aortocoronary bypass graft: Secondary | ICD-10-CM | POA: Diagnosis not present

## 2021-06-30 DIAGNOSIS — E782 Mixed hyperlipidemia: Secondary | ICD-10-CM | POA: Diagnosis not present

## 2021-06-30 DIAGNOSIS — Z794 Long term (current) use of insulin: Secondary | ICD-10-CM

## 2021-06-30 DIAGNOSIS — K219 Gastro-esophageal reflux disease without esophagitis: Secondary | ICD-10-CM

## 2021-06-30 DIAGNOSIS — E1151 Type 2 diabetes mellitus with diabetic peripheral angiopathy without gangrene: Secondary | ICD-10-CM | POA: Diagnosis not present

## 2021-06-30 DIAGNOSIS — G8929 Other chronic pain: Secondary | ICD-10-CM

## 2021-06-30 NOTE — Patient Instructions (Signed)
Take Torsemide daily for 3 days. Restart Aspirin. Try over-the-counter Eucerin Cream for dry skin.

## 2021-06-30 NOTE — Progress Notes (Signed)
I,April Miller,acting as a scribe for Wilhemena Durie, MD.,have documented all relevant documentation on the behalf of Wilhemena Durie, MD,as directed by  Wilhemena Durie, MD while in the presence of Wilhemena Durie, MD.  Established patient visit   Patient: Joseph [name]   DOB: 03-04-1925   87 y.o. Male  MRN: [id number] Visit Date: 06/30/2021  Today's healthcare provider: Wilhemena Durie, MD   Chief Complaint  Patient presents with   Follow-up   Hypertension   Subjective    HPI  Patient is doing well but is lonely as he lives alone after his wife of more than 22 years died last year.  We discussed his heart disease and the treatments and he is taking everything except aspirin.  Only complaint is 1 of intermittent lower extremity edema and chronic dry skin specially of the extremities. Hypertension, follow-up  BP Readings from Last 3 Encounters:  06/30/21 (!) 166/77  03/23/21 (!) 150/71  03/11/21 (!) 146/78   Wt Readings from Last 3 Encounters:  06/30/21 224 lb (101.6 kg)  03/23/21 219 lb (99.3 kg)  03/11/21 217 lb (98.4 kg)     He was last seen for hypertension 3 months ago.  BP at that visit was 146/78. Management since that visit includes; on losartan. He reports good compliance with treatment. He is not having side effects. none He is not exercising. He is adherent to low salt diet.   Outside blood pressures are 140/60.  He does not smoke.  Use of agents associated with hypertension: none.   ----------------------------------------------------------------------------------------------- Follow up for Atherosclerosis of coronary artery bypass graft of native heart with stable angina pectoris:  The patient was last seen for this 3 months ago. Changes made at last visit include; On maximal medical therapy including atorvastatin glycerin and torsemide.  He reports good compliance with treatment. He feels that condition is Unchanged. He is  not having side effects. none  -----------------------------------------------------------------------------------------  Patient is having swelling in legs and feet.  Also has a sore on middle left knuckle.     Medications: Outpatient Medications Prior to Visit  Medication Sig   aspirin EC 81 MG tablet Take 1 tablet (81 mg total) by mouth daily.   atorvastatin (LIPITOR) 80 MG tablet Take 1 tablet (80 mg total) by mouth daily.   Cholecalciferol 125 MCG (5000 UT) capsule Take 5,000 Units by mouth at bedtime.   clobetasol cream (TEMOVATE) [id number] % Apply 1 application topically 2 (two) times daily. As needed to hands and elbows   clopidogrel (PLAVIX) 75 MG tablet Take 1 tablet by mouth once daily   Continuous Blood Gluc Sensor (FREESTYLE LIBRE 14 DAY SENSOR) MISC USE AS DIRECTED EVERY 14 DAYS   hydrocortisone 2.5 % cream APPLY TOPICALLY TWICE DAILY AS NEEDED TO FACE, CREASE, GROIN AND BUTT   insulin aspart (NOVOLOG) 100 UNIT/ML injection Inject into the skin 3 (three) times daily before meals. Inject 28 units in the morning / 18 units at 12:00pm / and 18 units at night time.   insulin glargine (LANTUS) 100 UNIT/ML injection Inject 60 Units into the skin daily.   ketoconazole (NIZORAL) 2 % cream APPLY TOPICALLY TWICE DAILY AS NEEDED FOR IRRITATION TO GROIN, FACE, AND BUTTOCKS   levothyroxine (SYNTHROID) 50 MCG tablet TAKE 1 TABLET BY MOUTH ONCE DAILY BEFORE BREAKFAST   losartan (COZAAR) 100 MG tablet Take 1 tablet by mouth once daily   Magnesium 250 MG TABS Take 250 mg by mouth  as needed (swelling). Take when you take your Torsemide.   mometasone (ELOCON) 0.1 % ointment Apply topically daily. As needed to scalp and ears   nitroGLYCERIN (NITROSTAT) 0.4 MG SL tablet Place 1 tablet (0.4 mg total) under the tongue every 5 (five) minutes as needed for chest pain.   omeprazole (PRILOSEC) 20 MG capsule Take 1 capsule by mouth once daily   potassium chloride (KLOR-CON) 10 MEQ tablet Take 1 tablet (10  mEq total) by mouth every Monday, Wednesday, and Friday.   torsemide (DEMADEX) 20 MG tablet Take 20 mg by mouth every Monday, Wednesday, and Friday.   [DISCONTINUED] potassium chloride (MICRO-K) 10 MEQ CR capsule Take 10 mEq by mouth every Monday, Wednesday, and Friday. Takes 1 tablet as needed with furosemide (Patient not taking: Reported on 06/30/2021)   No facility-administered medications prior to visit.    Review of Systems  Constitutional:  Negative for appetite change, chills and fever.  Respiratory:  Negative for chest tightness, shortness of breath and wheezing.   Cardiovascular:  Negative for chest pain and palpitations.  Gastrointestinal:  Negative for abdominal pain, nausea and vomiting.       Objective    BP (!) 166/77 (BP Location: Left Arm, Patient Position: Sitting, Cuff Size: Large)   Pulse 64   Temp 97.7 F (36.5 C) (Temporal)   Resp 18   Ht '5\' 7"'$  (1.702 m)   Wt 224 lb (101.6 kg)   SpO2 96%   BMI 35.08 kg/m  BP Readings from Last 3 Encounters:  06/30/21 (!) 166/77  03/23/21 (!) 150/71  03/11/21 (!) 146/78   Wt Readings from Last 3 Encounters:  06/30/21 224 lb (101.6 kg)  03/23/21 219 lb (99.3 kg)  03/11/21 217 lb (98.4 kg)       Physical Exam Vitals reviewed.  Constitutional:      Appearance: He is obese.  HENT:     Head: Normocephalic and atraumatic.     Right Ear: External ear normal.     Left Ear: External ear normal.     Mouth/Throat:     Pharynx: Oropharynx is clear.  Cardiovascular:     Rate and Rhythm: Normal rate and regular rhythm.     Heart sounds: Normal heart sounds.  Pulmonary:     Breath sounds: Normal breath sounds.  Abdominal:     Palpations: Abdomen is soft.  Musculoskeletal:     Right lower leg: Edema present.     Left lower leg: Edema present.     Comments: 1+ lower extremity edema  Skin:    General: Skin is warm and dry.     Comments: Venous stasis changes of the skin of lower extremities  Neurological:     General:  No focal deficit present.     Mental Status: He is alert and oriented to person, place, and time.  Psychiatric:        Mood and Affect: Mood normal.        Behavior: Behavior normal.        Thought Content: Thought content normal.        Judgment: Judgment normal.      No results found for any visits on 06/30/21.  Assessment & Plan     1. 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  Restart baby aspirin daily  2. Acute on chronic diastolic heart failure (HCC) Stable.  We will have him to take torsemide for 3 days  for current gain and edema.  3. Type 2 diabetes mellitus with diabetic peripheral angiopathy without gangrene, with long-term current use of insulin ([place]) By endocrinology.  He is on insulin  4. Gastroesophageal reflux disease without esophagitis Patient on her cell daily  5. S/P CABG (coronary artery bypass graft) Statin 80 and Plavix and aspirin Follow-up in 3 months. 6. Morbid obesity due to excess calories ([place])   7. Mixed hyperlipidemia Atorvastatin 80  8. Chronic bilateral low back pain without sciatica Clinically patient is dealing with this well.  9. Neuropathy   10. Lumbar spondylosis    No follow-ups on file.      I, Wilhemena Durie, MD, have reviewed all documentation for this visit. The documentation on 07/09/21 for the exam, diagnosis, procedures, and orders are all accurate and complete.    Dreonna Hussein Cranford Mon, MD  [facility] [phone] (phone) [phone] (fax)  [place]

## 2021-07-09 ENCOUNTER — Encounter: Payer: Self-pay | Admitting: Family Medicine

## 2021-08-02 ENCOUNTER — Other Ambulatory Visit: Payer: Self-pay | Admitting: Cardiovascular Disease

## 2021-08-02 ENCOUNTER — Other Ambulatory Visit: Payer: Self-pay | Admitting: Family Medicine

## 2021-08-11 DIAGNOSIS — E1159 Type 2 diabetes mellitus with other circulatory complications: Secondary | ICD-10-CM | POA: Diagnosis not present

## 2021-08-11 LAB — HEMOGLOBIN A1C: Hemoglobin A1C: 8.3

## 2021-08-12 ENCOUNTER — Telehealth: Payer: Self-pay

## 2021-08-12 ENCOUNTER — Telehealth: Payer: Self-pay | Admitting: *Deleted

## 2021-08-12 NOTE — Telephone Encounter (Signed)
Copied from [place] [id number]. Topic: Appointment Scheduling - Scheduling Inquiry for Clinic >> Aug 12, 2021 10:45 AM Pawlus, Brayton Layman A wrote: Reason for CRM: Pt stated he is in pain and really needs an earlier appt, pt stated he cannot wait until 10/13. Pt wanted to speak to Dr Rosanna Randy.

## 2021-08-12 NOTE — Telephone Encounter (Signed)
Error

## 2021-08-12 NOTE — Telephone Encounter (Signed)
Pt requested an earlier appointment. I offered him an appointment with Daneil Dan tomorrow at 10:40 he states he could not make that appointment.  I told him we didn't have anything until his appointment with Dr Rosanna Randy on 10/13.  He says "I can't wait that long I'll be dead by then"     I asked him what "pain" he was having.  He has been having abdominal pain for over two weeks.  Changes in stool.  He says it has been "Mushy and today was black"  I advised him that could be very concerning for a GI bleed and he can not wait for an appointment.  He needed to be evaluated at an ER or at least go to an Urgent Care today.  He refuses to go and says "I don't feel like I need to go."  I told him these are our recommendations.  He states he will think about it and will keep his appointment for 10/13 for now.   Thanks,   -Mickel Baas

## 2021-08-13 ENCOUNTER — Ambulatory Visit: Payer: HMO | Admitting: Family Medicine

## 2021-08-13 ENCOUNTER — Ambulatory Visit: Payer: Self-pay

## 2021-08-13 NOTE — Telephone Encounter (Signed)
Pt. Reports he has had stomach pain x 2 weeks. None now. Had a bowel movement today. Stool was soft and dark brown. Informed of his appointment Monday. Verbalizes understanding.    Answer Assessment - Initial Assessment Questions 1. LOCATION: "Where does it hurt?"      Low abd. 2. RADIATION: "Does the pain shoot anywhere else?" (e.g., chest, back)     No 3. ONSET: "When did the pain begin?" (Minutes, hours or days ago)      2 weeks ago 4. SUDDEN: "Gradual or sudden onset?"     Gradual 5. PATTERN "Does the pain come and go, or is it constant?"    - If constant: "Is it getting better, staying the same, or worsening?"      (Note: Constant means the pain never goes away completely; most serious pain is constant and it progresses)     - If intermittent: "How long does it last?" "Do you have pain now?"     (Note: Intermittent means the pain goes away completely between bouts)     Comes and goes 6. SEVERITY: "How bad is the pain?"  (e.g., Scale 1-10; mild, moderate, or severe)    - MILD (1-3): doesn't interfere with normal activities, abdomen soft and not tender to touch     - MODERATE (4-7): interferes with normal activities or awakens from sleep, abdomen tender to touch     - SEVERE (8-10): excruciating pain, doubled over, unable to do any normal activities       Now - no pain 7. RECURRENT SYMPTOM: "Have you ever had this type of stomach pain before?" If Yes, ask: "When was the last time?" and "What happened that time?"      No 8. CAUSE: "What do you think is causing the stomach pain?"     Unsure 9. RELIEVING/AGGRAVATING FACTORS: "What makes it better or worse?" (e.g., movement, antacids, bowel movement)     *No Answer* 10. OTHER SYMPTOMS: "Do you have any other symptoms?" (e.g., back pain, diarrhea, fever, urination pain, vomiting)       No  Protocols used: Abdominal Pain - Male-A-AH

## 2021-08-13 NOTE — Telephone Encounter (Signed)
Daughter called in because she was upset patient home health nurse advised her that patient was not able to see PCP for several weeks. Caller states patient is experiencing urgency to have a bowel but unable to have a bowel movement.  Patient states he is in pain. Patient has been experiencing this for several weeks. Patient scheduled with PCP for Monday at 11:20am

## 2021-08-16 ENCOUNTER — Encounter: Payer: Self-pay | Admitting: Family Medicine

## 2021-08-16 ENCOUNTER — Other Ambulatory Visit: Payer: Self-pay

## 2021-08-16 ENCOUNTER — Ambulatory Visit (INDEPENDENT_AMBULATORY_CARE_PROVIDER_SITE_OTHER): Payer: HMO | Admitting: Family Medicine

## 2021-08-16 VITALS — BP 181/68 | HR 73 | Temp 97.9°F | Resp 20 | Wt 219.0 lb

## 2021-08-16 DIAGNOSIS — Z794 Long term (current) use of insulin: Secondary | ICD-10-CM

## 2021-08-16 DIAGNOSIS — E6609 Other obesity due to excess calories: Secondary | ICD-10-CM

## 2021-08-16 DIAGNOSIS — R197 Diarrhea, unspecified: Secondary | ICD-10-CM

## 2021-08-16 DIAGNOSIS — E1151 Type 2 diabetes mellitus with diabetic peripheral angiopathy without gangrene: Secondary | ICD-10-CM | POA: Diagnosis not present

## 2021-08-16 DIAGNOSIS — R1084 Generalized abdominal pain: Secondary | ICD-10-CM | POA: Diagnosis not present

## 2021-08-16 DIAGNOSIS — I5032 Chronic diastolic (congestive) heart failure: Secondary | ICD-10-CM

## 2021-08-16 DIAGNOSIS — K219 Gastro-esophageal reflux disease without esophagitis: Secondary | ICD-10-CM

## 2021-08-16 DIAGNOSIS — Z951 Presence of aortocoronary bypass graft: Secondary | ICD-10-CM | POA: Diagnosis not present

## 2021-08-16 DIAGNOSIS — E782 Mixed hyperlipidemia: Secondary | ICD-10-CM

## 2021-08-16 DIAGNOSIS — E66811 Obesity, class 1: Secondary | ICD-10-CM

## 2021-08-16 DIAGNOSIS — I25708 Atherosclerosis of coronary artery bypass graft(s), unspecified, with other forms of angina pectoris: Secondary | ICD-10-CM | POA: Diagnosis not present

## 2021-08-16 DIAGNOSIS — Z6834 Body mass index (BMI) 34.0-34.9, adult: Secondary | ICD-10-CM

## 2021-08-16 MED ORDER — CHOLESTYRAMINE 4 G PO PACK: 4.0000 g | PACK | Freq: Three times a day (TID) | ORAL | 12 refills | Status: DC

## 2021-08-16 NOTE — Progress Notes (Signed)
Established patient visit   Patient: Joseph [name]   DOB: 1925/01/03   87 y.o. Male  MRN: [id number] Visit Date: 08/16/2021  Today's healthcare provider: Wilhemena Durie, MD   Chief Complaint  Patient presents with   Abdominal Pain   Subjective    Abdominal Pain This is a recurrent problem. The current episode started in the past 7 days. The onset quality is gradual. The problem occurs intermittently. The problem has been gradually worsening. The pain is located in the generalized abdominal region. The pain is moderate. The quality of the pain is dull and a sensation of fullness. Associated symptoms include constipation, diarrhea and nausea. Pertinent negatives include no anorexia, arthralgias, belching, dysuria, fever, flatus, frequency, headaches, hematochezia or vomiting. The pain is aggravated by eating. The pain is relieved by Nothing. He has tried nothing for the symptoms.   Patient is status post cholecystectomy in the past and states for the past 3 to 4 weeks after eating he has frequent bowel movements and has to strain at times alternate was solid and mushy stools.  He states he has had 1 black stool but does not describe melena.    Medications: Outpatient Medications Prior to Visit  Medication Sig   aspirin EC 81 MG tablet Take 1 tablet (81 mg total) by mouth daily.   atorvastatin (LIPITOR) 80 MG tablet Take 1 tablet (80 mg total) by mouth daily.   Cholecalciferol 125 MCG (5000 UT) capsule Take 5,000 Units by mouth at bedtime.   clobetasol cream (TEMOVATE) 0.10 % Apply 1 application topically 2 (two) times daily. As needed to hands and elbows   clopidogrel (PLAVIX) 75 MG tablet Take 1 tablet by mouth once daily   Continuous Blood Gluc Sensor (FREESTYLE LIBRE 14 DAY SENSOR) MISC USE AS DIRECTED EVERY 14 DAYS   hydrocortisone 2.5 % cream APPLY TOPICALLY TWICE DAILY AS NEEDED TO FACE, CREASE, GROIN AND BUTT   insulin aspart (NOVOLOG) 100 UNIT/ML injection Inject  into the skin 3 (three) times daily before meals. Inject 28 units in the morning / 18 units at 12:00pm / and 18 units at night time.   insulin glargine (LANTUS) 100 UNIT/ML injection Inject 60 Units into the skin daily.   ketoconazole (NIZORAL) 2 % cream APPLY TOPICALLY TWICE DAILY AS NEEDED FOR IRRITATION TO GROIN, FACE, AND BUTTOCKS   levothyroxine (SYNTHROID) 50 MCG tablet TAKE 1 TABLET BY MOUTH ONCE DAILY BEFORE BREAKFAST   losartan (COZAAR) 100 MG tablet Take 1 tablet by mouth once daily   Magnesium 250 MG TABS Take 250 mg by mouth as needed (swelling). Take when you take your Torsemide.   mometasone (ELOCON) 0.1 % ointment Apply topically daily. As needed to scalp and ears   nitroGLYCERIN (NITROSTAT) 0.4 MG SL tablet Place 1 tablet (0.4 mg total) under the tongue every 5 (five) minutes as needed for chest pain.   omeprazole (PRILOSEC) 20 MG capsule Take 1 capsule by mouth once daily   potassium chloride (KLOR-CON) 10 MEQ tablet Take 1 tablet (10 mEq total) by mouth every Monday, Wednesday, and Friday.   torsemide (DEMADEX) 20 MG tablet Take 20 mg by mouth every Monday, Wednesday, and Friday.   No facility-administered medications prior to visit.    Review of Systems  Constitutional:  Negative for fever.  Gastrointestinal:  Positive for abdominal pain, constipation, diarrhea and nausea. Negative for anorexia, flatus, hematochezia and vomiting.  Genitourinary:  Negative for dysuria and frequency.  Musculoskeletal:  Negative for  arthralgias.  Neurological:  Negative for headaches.   Last hemoglobin A1c Lab Results  Component Value Date   HGBA1C 9.4 09/15/2020       Objective    BP (!) 181/68   Pulse 73   Temp 97.9 F (36.6 C)   Resp 20   Wt 219 lb (99.3 kg)   BMI 34.30 kg/m  BP Readings from Last 3 Encounters:  08/16/21 (!) 181/68  06/30/21 (!) 166/77  03/23/21 (!) 150/71   Wt Readings from Last 3 Encounters:  08/16/21 219 lb (99.3 kg)  06/30/21 224 lb (101.6 kg)   03/23/21 219 lb (99.3 kg)      Physical Exam Vitals reviewed.  Constitutional:      Appearance: He is obese.  HENT:     Head: Normocephalic and atraumatic.     Right Ear: External ear normal.     Left Ear: External ear normal.     Mouth/Throat:     Pharynx: Oropharynx is clear.  Cardiovascular:     Rate and Rhythm: Normal rate and regular rhythm.     Heart sounds: Normal heart sounds.  Pulmonary:     Breath sounds: Normal breath sounds.  Abdominal:     General: Bowel sounds are normal.     Palpations: Abdomen is soft.     Tenderness: There is no abdominal tenderness.  Musculoskeletal:     Right lower leg: Edema present.     Left lower leg: Edema present.     Comments: 1+ lower extremity edema  Skin:    General: Skin is warm and dry.     Comments: Venous stasis changes of the skin of lower extremities  Neurological:     General: No focal deficit present.     Mental Status: He is alert and oriented to person, place, and time.  Psychiatric:        Mood and Affect: Mood normal.        Behavior: Behavior normal.        Thought Content: Thought content normal.        Judgment: Judgment normal.      No results found for any visits on 08/16/21.  Assessment & Plan     1. Generalized abdominal pain Normal exam today.  Patient s/p cholecystectomy so we will try Questran every morning.  Follow-up in a couple weeks.  May need imaging and lab work. - cholestyramine (QUESTRAN) 4 g packet; Take 1 packet (4 g total) by mouth 3 (three) times daily with meals.  Dispense: 60 each; Refill: 12  2. Diarrhea, unspecified type As above. - cholestyramine (QUESTRAN) 4 g packet; Take 1 packet (4 g total) by mouth 3 (three) times daily with meals.  Dispense: 60 each; Refill: 12  3. Type 2 diabetes mellitus with diabetic peripheral angiopathy without gangrene, with long-term current use of insulin [facility]) Per endocrinology.  4. Atherosclerosis of coronary artery bypass graft of native heart  with stable angina pectoris ([place]) Status post CABG  5. Chronic diastolic CHF (congestive heart failure) (HCC) Clinically stable at this time.  6. Gastroesophageal reflux disease without esophagitis   7. Class 1 obesity due to excess calories with serious comorbidity and body mass index (BMI) of 34.0 to 34.9 in adult   8. S/P CABG (coronary artery bypass graft) All risk factors treated  9. Mixed hyperlipidemia On statin   No follow-ups on file.      I, Wilhemena Durie, MD, have reviewed all documentation for this visit. The  documentation on 08/18/21 for the exam, diagnosis, procedures, and orders are all accurate and complete.    Jabre Heo Cranford Mon, MD  [facility] [phone] (phone) [phone] (fax)  [place]

## 2021-08-17 DIAGNOSIS — Z794 Long term (current) use of insulin: Secondary | ICD-10-CM | POA: Diagnosis not present

## 2021-08-17 DIAGNOSIS — E1129 Type 2 diabetes mellitus with other diabetic kidney complication: Secondary | ICD-10-CM | POA: Diagnosis not present

## 2021-08-17 DIAGNOSIS — E785 Hyperlipidemia, unspecified: Secondary | ICD-10-CM | POA: Diagnosis not present

## 2021-08-17 DIAGNOSIS — E1165 Type 2 diabetes mellitus with hyperglycemia: Secondary | ICD-10-CM | POA: Diagnosis not present

## 2021-08-17 DIAGNOSIS — R809 Proteinuria, unspecified: Secondary | ICD-10-CM | POA: Diagnosis not present

## 2021-08-17 DIAGNOSIS — E1169 Type 2 diabetes mellitus with other specified complication: Secondary | ICD-10-CM | POA: Diagnosis not present

## 2021-08-17 DIAGNOSIS — E1142 Type 2 diabetes mellitus with diabetic polyneuropathy: Secondary | ICD-10-CM | POA: Diagnosis not present

## 2021-08-17 DIAGNOSIS — E1159 Type 2 diabetes mellitus with other circulatory complications: Secondary | ICD-10-CM | POA: Diagnosis not present

## 2021-09-02 ENCOUNTER — Encounter: Payer: Self-pay | Admitting: Family Medicine

## 2021-09-02 ENCOUNTER — Other Ambulatory Visit: Payer: Self-pay

## 2021-09-02 ENCOUNTER — Ambulatory Visit (INDEPENDENT_AMBULATORY_CARE_PROVIDER_SITE_OTHER): Payer: HMO | Admitting: Family Medicine

## 2021-09-02 VITALS — BP 176/68 | HR 59 | Temp 98.2°F | Wt 224.0 lb

## 2021-09-02 DIAGNOSIS — G629 Polyneuropathy, unspecified: Secondary | ICD-10-CM | POA: Diagnosis not present

## 2021-09-02 DIAGNOSIS — R600 Localized edema: Secondary | ICD-10-CM | POA: Diagnosis not present

## 2021-09-02 DIAGNOSIS — R1084 Generalized abdominal pain: Secondary | ICD-10-CM | POA: Diagnosis not present

## 2021-09-02 DIAGNOSIS — E039 Hypothyroidism, unspecified: Secondary | ICD-10-CM | POA: Diagnosis not present

## 2021-09-02 DIAGNOSIS — R29898 Other symptoms and signs involving the musculoskeletal system: Secondary | ICD-10-CM

## 2021-09-02 DIAGNOSIS — E1151 Type 2 diabetes mellitus with diabetic peripheral angiopathy without gangrene: Secondary | ICD-10-CM

## 2021-09-02 DIAGNOSIS — I25708 Atherosclerosis of coronary artery bypass graft(s), unspecified, with other forms of angina pectoris: Secondary | ICD-10-CM

## 2021-09-02 DIAGNOSIS — E782 Mixed hyperlipidemia: Secondary | ICD-10-CM

## 2021-09-02 DIAGNOSIS — Z951 Presence of aortocoronary bypass graft: Secondary | ICD-10-CM

## 2021-09-02 DIAGNOSIS — I10 Essential (primary) hypertension: Secondary | ICD-10-CM

## 2021-09-02 DIAGNOSIS — Z794 Long term (current) use of insulin: Secondary | ICD-10-CM | POA: Diagnosis not present

## 2021-09-02 NOTE — Progress Notes (Signed)
Established patient visit   Patient: Joseph [name]   DOB: 05-21-1925   87 y.o. Male  MRN: [id number] Visit Date: 09/02/2021  Today's healthcare provider: Wilhemena Durie, MD   Chief Complaint  Patient presents with   Abdominal Pain   Subjective    Constipation This is a new problem. The current episode started 1 to 4 weeks ago. His stool frequency is 4 to 5 times per week. The stool is described as loose. The patient is not on a high fiber diet. There has Not been adequate water intake. Pertinent negatives include no abdominal pain, diarrhea, difficulty urinating, fever, hematochezia, hemorrhoids, melena, nausea or vomiting.   The patient does not describe this as a problem.  He is states he has noticed a mild change recently. Follow up for abdominal pain  The patient was last seen for this 4 months ago. Changes made at last visit include starting Questran 1 packet three times daily.   He reports excellent compliance with treatment.  He feels that condition is Improved.  Pt states he finished Questran and is not having anymore trouble.  He is not having side effects.   -----------------------------------------------------------------------------------------     Medications: Outpatient Medications Prior to Visit  Medication Sig   aspirin EC 81 MG tablet Take 1 tablet (81 mg total) by mouth daily.   Cholecalciferol 125 MCG (5000 UT) capsule Take 5,000 Units by mouth at bedtime.   cholestyramine (QUESTRAN) 4 g packet Take 1 packet (4 g total) by mouth 3 (three) times daily with meals.   clobetasol cream (TEMOVATE) 1.44 % Apply 1 application topically 2 (two) times daily. As needed to hands and elbows   clopidogrel (PLAVIX) 75 MG tablet Take 1 tablet by mouth once daily   Continuous Blood Gluc Sensor (FREESTYLE LIBRE 14 DAY SENSOR) MISC USE AS DIRECTED EVERY 14 DAYS   hydrocortisone 2.5 % cream APPLY TOPICALLY TWICE DAILY AS NEEDED TO FACE, CREASE, GROIN AND BUTT    insulin aspart (NOVOLOG) 100 UNIT/ML injection Inject into the skin 3 (three) times daily before meals. Inject 34 units in the morning / 24 units at 12:00pm / and 14 units at night time.   insulin glargine (LANTUS) 100 UNIT/ML injection Inject 50 Units into the skin daily.   ketoconazole (NIZORAL) 2 % cream APPLY TOPICALLY TWICE DAILY AS NEEDED FOR IRRITATION TO GROIN, FACE, AND BUTTOCKS   levothyroxine (SYNTHROID) 50 MCG tablet TAKE 1 TABLET BY MOUTH ONCE DAILY BEFORE BREAKFAST   losartan (COZAAR) 100 MG tablet Take 1 tablet by mouth once daily   Magnesium 250 MG TABS Take 250 mg by mouth as needed (swelling). Take when you take your Torsemide.   mometasone (ELOCON) 0.1 % ointment Apply topically daily. As needed to scalp and ears   nitroGLYCERIN (NITROSTAT) 0.4 MG SL tablet Place 1 tablet (0.4 mg total) under the tongue every 5 (five) minutes as needed for chest pain.   omeprazole (PRILOSEC) 20 MG capsule Take 1 capsule by mouth once daily   potassium chloride (KLOR-CON) 10 MEQ tablet Take 1 tablet (10 mEq total) by mouth every Monday, Wednesday, and Friday.   torsemide (DEMADEX) 20 MG tablet Take 20 mg by mouth every Monday, Wednesday, and Friday.   atorvastatin (LIPITOR) 80 MG tablet Take 1 tablet (80 mg total) by mouth daily.   No facility-administered medications prior to visit.    Review of Systems  Constitutional: Negative.  Negative for fever.  Respiratory: Negative.  Cardiovascular: Negative.   Gastrointestinal:  Positive for constipation. Negative for abdominal pain, diarrhea, hematochezia, hemorrhoids, melena, nausea and vomiting.  Genitourinary:  Negative for difficulty urinating.  Neurological:  Negative for dizziness, light-headedness and headaches.      Objective    BP (!) 176/68 (BP Location: Right Arm, Patient Position: Sitting, Cuff Size: Large)   Pulse (!) 59   Temp 98.2 F (36.8 C) (Temporal)   Wt 224 lb (101.6 kg)   SpO2 97%   BMI 35.08 kg/m    Physical  Exam Vitals reviewed.  Constitutional:      Appearance: He is obese.  HENT:     Head: Normocephalic and atraumatic.     Right Ear: External ear normal.     Left Ear: External ear normal.     Mouth/Throat:     Pharynx: Oropharynx is clear.  Cardiovascular:     Rate and Rhythm: Normal rate and regular rhythm.     Heart sounds: Normal heart sounds.  Pulmonary:     Breath sounds: Normal breath sounds.  Abdominal:     General: Bowel sounds are normal.     Palpations: Abdomen is soft.     Tenderness: There is no abdominal tenderness.  Musculoskeletal:     Right lower leg: Edema present.     Left lower leg: Edema present.     Comments: 1+ lower extremity edema  Skin:    General: Skin is warm and dry.     Comments: Venous stasis changes of the skin of lower extremities  Neurological:     General: No focal deficit present.     Mental Status: He is alert and oriented to person, place, and time.  Psychiatric:        Mood and Affect: Mood normal.        Behavior: Behavior normal.        Thought Content: Thought content normal.        Judgment: Judgment normal.      Results for orders placed or performed in visit on 09/02/21  Hemoglobin A1c  Result Value Ref Range   Hemoglobin A1C 8.3     Assessment & Plan     1. Essential hypertension Good control at home blood pressures running 138/56 in this 87 year old man Tinea losartan 2. Mixed hyperlipidemia On high-dose atorvastatin  3. S/P CABG (coronary artery bypass graft) End-stage disease with medical management.  Clinically stable for the past year. - Ambulatory referral to Cardiology  4. Type 2 diabetes mellitus with diabetic peripheral angiopathy without gangrene, with long-term current use of insulin ([place]) Followed by endocrinology.  5. Atherosclerosis of coronary artery bypass graft of native heart with stable angina pectoris ([place]) All risk factors treated  6. Adult hypothyroidism   7. Neuropathy   8. Bilateral  leg weakness Stable.  Consider physical therapy referral if this worsens  9. Bilateral leg edema Wear support hose  10. Generalized abdominal pain Advised patient to try Questran daily.   No follow-ups on file.      I, Wilhemena Durie, MD, have reviewed all documentation for this visit. The documentation on 09/07/21 for the exam, diagnosis, procedures, and orders are all accurate and complete.    Walter Grima Cranford Mon, MD  [facility] [phone] (phone) [phone] (fax)  [place]

## 2021-09-19 NOTE — Progress Notes (Signed)
Cardiology Office Note  Date:  09/20/2021   ID:  ORDEAN [name], DOB 11/10/1925, MRN [id number]  PCP:  [name]., MD   Chief Complaint  Patient presents with   6 month follow up     "Doing well." Medications reviewed by the patient verbally.     HPI:  Mr. Joseph [name] is a 87 year old gentleman with a hx of  CAD, CABG in 1994,  PAD , followed by Dr.  Lucky Cowboy,  obesity,  hyperlipidemia,  arthritis of the knees,   fatigue, SOB, leg weakness,   lower extremity edema,  Carotid u/s  Less than 39% stenosis bilaterally presenting for routine followup of his coronary artery disease and  chronic diastolic CHF.    Sedentary, lots of TV, sitting No walking "Girl comes in fixes breakfast lunch" Other days fixes his own food  Lost wife beginning of the year 2022 Walking hard, legs weak  No chest pain on exertion  Poor diet Followed by endocrine  Torsemide PRN  Labs reviewed A1c 8.3, down from 9.4 in 08/2020 Total chol 115, LDL 57   EKG personally reviewed by myself on todays visit Nsr rate 89  LBBB  -cath 12/17/2019  Severe native CAD, including 80% distal LMCA, 100% proximal LAD and diffuse LCx/OM disease.  90% mid RCA stenosis and CTO of rPDA also present. Patent LIMA->D3 with backfilling of the mid/distal LADl. Patent SVG-D1-D2, SVG-OM1-OM2, and SVG-rPDA-rPL. Mildly to moderately elevated LVEDP. At least moderately reduced left ventricular systolic function.   Medical therapy; recommend up to 12 months of DAPT with ASA and clopidogrel, as tolerated. Aggressive secondary prevention of coronary artery disease. Obtain echocardiogram to better assess LVEF.. Monitor on telemetry.  Consider EP consultation of AV block persists, particularly if the patient is symptoms.   Echo 12/17/2019  1. Left ventricular ejection fraction, by visual estimation, is 50 to  55%. The left ventricle has low normal function. There is mildly increased  left ventricular  hypertrophy.  Other past medical history reviewed  headache 02/03/2017,  Seen in th ER for H/A 02/03/17 Confused,  In the emergency room had CT head, Showing atherosclerotic calcification of the cavernous carotid arteries bilaterally. Lab work within normal limits No other focal neurologic deficits Patient and family thinks he had a TIA  admission May 2017 for acute cholecystitis, developing acute respiratory distress with hypoxia and acute renal disease, peak creatinine of 2. Notes indicating HIDA scan positive for acute cholecystitis. s/p cholecystostomy tube placed in through interventional radiology 03/24/16 Diuretic was discontinued in the setting of acute renal failure He does report greater than 20 pound weight loss   Echocardiogram 03/22/2016 reviewed with him, ejection fraction 50-55% Chest x-ray 03/29/2016 with small right pleural effusion   Ultrasound of the legs showing patent right SFA stent, left side CFA and popliteal artery with good flow, waveform deterioration around the ankle on the left   Last Cardiac catheterization was performed at [facility] 08/16/2012 Catheterization showed severe three-vessel coronary artery disease with patent grafts x4. There was 80% disease of a small to moderate sized distal left circumflex. Ejection fraction 45-50%. No significant aortic valve stenosis or mitral valve regurgitation. The findings were discussed with interventional cardiology and medical management was recommended for the distal left circumflex disease.    previous Echocardiogram did not show elevated right-sided pressures and ejection fraction was normal.  Previous episode of tachycardia in June 2013 that resolved without intervention   PMH:   has a past medical history of Acute respiratory  failure with hypoxia ([place]) (03/21/2016), CAD (coronary artery disease), Chronic diastolic CHF (congestive heart failure) ([place]), Diabetes mellitus, type 2 ([place]), HTN (hypertension), Hyperlipidemia,  Hypothyroidism, Lower extremity edema, and Spinal stenosis.  PSH:    Past Surgical History:  Procedure Laterality Date   APPENDECTOMY     CARDIAC CATHETERIZATION  07/2012   [facility]/no stents   CHOLECYSTECTOMY     CORNEAL TRANSPLANT     CORONARY ARTERY BYPASS GRAFT  1994   HEMORRHOID SURGERY     LEFT HEART CATH AND CORS/GRAFTS ANGIOGRAPHY N/A 12/17/2019   Procedure: LEFT HEART CATH AND CORS/GRAFTS ANGIOGRAPHY;  Surgeon: Nelva Bush, MD;  Location: [place];  Service: Cardiovascular;  Laterality: N/A;   PROSTATE BIOPSY     TOE SURGERY     ingrown toe nail   Current Outpatient Medications on File Prior to Visit  Medication Sig Dispense Refill   atorvastatin (LIPITOR) 80 MG tablet Take 1 tablet (80 mg total) by mouth daily. 90 tablet 3   Cholecalciferol 125 MCG (5000 UT) capsule Take 5,000 Units by mouth at bedtime.     cholestyramine (QUESTRAN) 4 g packet Take 1 packet (4 g total) by mouth 3 (three) times daily with meals. 60 each 12   clobetasol cream (TEMOVATE) 3.26 % Apply 1 application topically 2 (two) times daily. As needed to hands and elbows     clopidogrel (PLAVIX) 75 MG tablet Take 1 tablet by mouth once daily 90 tablet 0   Continuous Blood Gluc Sensor (FREESTYLE LIBRE 14 DAY SENSOR) MISC USE AS DIRECTED EVERY 14 DAYS 2 each 0   hydrocortisone 2.5 % cream APPLY TOPICALLY TWICE DAILY AS NEEDED TO FACE, CREASE, GROIN AND BUTT 28 g 0   insulin aspart (NOVOLOG) 100 UNIT/ML injection Inject into the skin 3 (three) times daily before meals. Inject 34 units in the morning / 24 units at 12:00pm / and 14 units at night time.     insulin glargine (LANTUS) 100 UNIT/ML injection Inject 50 Units into the skin daily.     ketoconazole (NIZORAL) 2 % cream APPLY TOPICALLY TWICE DAILY AS NEEDED FOR IRRITATION TO GROIN, FACE, AND BUTTOCKS 15 g 0   levothyroxine (SYNTHROID) 50 MCG tablet TAKE 1 TABLET BY MOUTH ONCE DAILY BEFORE BREAKFAST 90 tablet 0   losartan (COZAAR) 100 MG tablet  Take 1 tablet by mouth once daily 90 tablet 0   Magnesium 250 MG TABS Take 250 mg by mouth as needed (swelling). Take when you take your Torsemide.     mometasone (ELOCON) 0.1 % ointment Apply topically daily. As needed to scalp and ears     nitroGLYCERIN (NITROSTAT) 0.4 MG SL tablet Place 1 tablet (0.4 mg total) under the tongue every 5 (five) minutes as needed for chest pain. 25 tablet 3   omeprazole (PRILOSEC) 20 MG capsule Take 1 capsule by mouth once daily 90 capsule 3   potassium chloride (KLOR-CON) 10 MEQ tablet Take 1 tablet (10 mEq total) by mouth every Monday, Wednesday, and Friday. 45 tablet 0   torsemide (DEMADEX) 20 MG tablet Take 20 mg by mouth every Monday, Wednesday, and Friday.     aspirin EC 81 MG tablet Take 1 tablet (81 mg total) by mouth daily. (Patient not taking: Reported on 09/20/2021) 90 tablet 3   No current facility-administered medications on file prior to visit.    Allergies:   Patient has no known allergies.   Social History:  The patient  reports that he quit smoking about 40  years ago. His smoking use included pipe, cigars, and cigarettes. He has never used smokeless tobacco. He reports that he does not drink alcohol and does not use drugs.   Family History:   family history includes Heart attack in his father; Heart failure in his maternal grandfather, maternal uncle, and mother; Kidney Stones in his son; Sleep apnea in his daughter and son; Stroke in his brother.    Review of Systems: Review of Systems  Constitutional: Negative.        Gait instability  HENT: Negative.    Respiratory: Negative.    Cardiovascular:  Positive for chest pain.  Gastrointestinal:  Positive for abdominal pain.  Musculoskeletal: Negative.   Neurological: Negative.   Psychiatric/Behavioral: Negative.    All other systems reviewed and are negative.  PHYSICAL EXAM: VS:  BP 138/62 (BP Location: Left Arm, Patient Position: Sitting, Cuff Size: Normal)   Pulse 89   Ht 5\' 7"   (1.702 m)   Wt 221 lb 4 oz (100.4 kg)   SpO2 97%   BMI 34.65 kg/m  , BMI Body mass index is 34.65 kg/m. Constitutional:  oriented to person, place, and time. No distress.  HENT:  Head: Grossly normal Eyes:  no discharge. No scleral icterus.  Neck: No JVD, no carotid bruits  Cardiovascular: Regular rate and rhythm, no murmurs appreciated Pulmonary/Chest: Clear to auscultation bilaterally, no wheezes or rails Abdominal: Soft.  no distension.  no tenderness.  Musculoskeletal: Normal range of motion Neurological:  normal muscle tone. Coordination normal. No atrophy Skin: Skin warm and dry Psychiatric: normal affect, pleasant  Recent Labs: 03/11/2021: BUN 16; Creatinine, Ser 1.08; Potassium 4.5; Sodium 140    Lipid Panel Lab Results  Component Value Date   CHOL 121 12/17/2019   HDL 34 (L) 12/17/2019   LDLCALC 67 12/17/2019   TRIG 98 12/17/2019      Wt Readings from Last 3 Encounters:  09/20/21 221 lb 4 oz (100.4 kg)  09/02/21 224 lb (101.6 kg)  08/16/21 219 lb (99.3 kg)     ASSESSMENT AND PLAN:   Mixed hyperlipidemia -  Cholesterol is at goal on the current lipid regimen. No changes to the medications were made.  HYPERTENSION, BENIGN -  Off b-blocker, noted to have AV block Blood pressure is well controlled on today's visit. No changes made to the medications.  Atrial tachycardia Having no sx,  No significant tachycardia Beta-blocker held for AV block  Atherosclerosis of coronary artery bypass graft of native heart with stable angina pectoris [facility]) -  Catheterization 2021 detailing patent grafts Denies any chest pain symptoms, very sedentary at baseline  Diabetes mellitus with peripheral vascular disease ([place]) -  We have encouraged continued exercise, careful diet management in an effort to lose weight. Followed by endocrine ZJI9C 8.4  Diastolic CHF, acute on chronic (HCC) -  Euvolemic, takes torsemide as needed for leg swelling  SOB (shortness of breath) -   Secondary to fluid, deconditioning, obesity Long discussion concerning walking program  Morbid obesity due to excess calories (HCC) -  Low carbohydrates, walking program  Bilateral leg edema -  Stable symptoms, torsemide as needed  PAD (peripheral artery disease) (HCC) -  Cholesterol is at goal on the current lipid regimen. No changes to the medications were made. A1c above goal   Total encounter time more than 25 minutes  Greater than 50% was spent in counseling and coordination of care with the patient    No orders of the defined types were placed in  this encounter.    Signed, Esmond Plants, M.D., Ph.D. 09/20/2021  [place], [place]

## 2021-09-20 ENCOUNTER — Encounter: Payer: Self-pay | Admitting: Cardiovascular Disease

## 2021-09-20 ENCOUNTER — Other Ambulatory Visit: Payer: Self-pay

## 2021-09-20 ENCOUNTER — Ambulatory Visit: Payer: HMO | Admitting: Cardiovascular Disease

## 2021-09-20 VITALS — BP 138/62 | HR 89 | Ht 67.0 in | Wt 221.2 lb

## 2021-09-20 DIAGNOSIS — I5032 Chronic diastolic (congestive) heart failure: Secondary | ICD-10-CM

## 2021-09-20 DIAGNOSIS — Z951 Presence of aortocoronary bypass graft: Secondary | ICD-10-CM

## 2021-09-20 DIAGNOSIS — E1151 Type 2 diabetes mellitus with diabetic peripheral angiopathy without gangrene: Secondary | ICD-10-CM | POA: Diagnosis not present

## 2021-09-20 DIAGNOSIS — I10 Essential (primary) hypertension: Secondary | ICD-10-CM | POA: Diagnosis not present

## 2021-09-20 DIAGNOSIS — I25118 Atherosclerotic heart disease of native coronary artery with other forms of angina pectoris: Secondary | ICD-10-CM | POA: Diagnosis not present

## 2021-09-20 DIAGNOSIS — Z794 Long term (current) use of insulin: Secondary | ICD-10-CM

## 2021-09-20 DIAGNOSIS — E785 Hyperlipidemia, unspecified: Secondary | ICD-10-CM | POA: Diagnosis not present

## 2021-09-20 MED ORDER — TORSEMIDE 20 MG PO TABS: 20.0000 mg | ORAL_TABLET | ORAL | 3 refills | Status: DC

## 2021-09-20 MED ORDER — CLOPIDOGREL BISULFATE 75 MG PO TABS: 75.0000 mg | ORAL_TABLET | Freq: Every day | ORAL | 3 refills | Status: DC

## 2021-09-20 MED ORDER — LOSARTAN POTASSIUM 100 MG PO TABS: 100.0000 mg | ORAL_TABLET | Freq: Every day | ORAL | 3 refills | Status: DC

## 2021-09-20 MED ORDER — POTASSIUM CHLORIDE ER 10 MEQ PO TBCR: 10.0000 meq | EXTENDED_RELEASE_TABLET | ORAL | 3 refills | Status: DC

## 2021-09-20 NOTE — Patient Instructions (Addendum)
Medication Instructions:  No changes  If you need a refill on your cardiac medications before your next appointment, please call your pharmacy.   Lab work: No new labs needed  Testing/Procedures: No new testing needed  Follow-Up: At [facility], you and your health needs are our priority.  As part of our continuing mission to provide you with exceptional heart care, we have created designated Provider Care Teams.  These Care Teams include your primary Cardiologist (physician) and Advanced Practice Providers (APPs -  Physician Assistants and Nurse Practitioners) who all work together to provide you with the care you need, when you need it.  You will need a follow up appointment in 12 months  Providers on your designated Care Team:   Christopher Berge, NP Ryan Dunn, PA-C Cadence Furth, PA-C  COVID-19 Vaccine Information can be found at: [email or web address] For questions related to vaccine distribution or appointments, please email [email or web address] or call [phone].   

## 2021-09-30 ENCOUNTER — Other Ambulatory Visit: Payer: Self-pay | Admitting: *Deleted

## 2021-09-30 ENCOUNTER — Other Ambulatory Visit: Payer: Self-pay

## 2021-09-30 ENCOUNTER — Ambulatory Visit (INDEPENDENT_AMBULATORY_CARE_PROVIDER_SITE_OTHER): Payer: HMO | Admitting: Family Medicine

## 2021-09-30 ENCOUNTER — Encounter: Payer: Self-pay | Admitting: Family Medicine

## 2021-09-30 VITALS — BP 163/65 | HR 84 | Temp 98.3°F | Resp 18 | Ht 67.0 in | Wt 217.0 lb

## 2021-09-30 DIAGNOSIS — I25708 Atherosclerosis of coronary artery bypass graft(s), unspecified, with other forms of angina pectoris: Secondary | ICD-10-CM

## 2021-09-30 DIAGNOSIS — E039 Hypothyroidism, unspecified: Secondary | ICD-10-CM | POA: Diagnosis not present

## 2021-09-30 DIAGNOSIS — R600 Localized edema: Secondary | ICD-10-CM | POA: Diagnosis not present

## 2021-09-30 DIAGNOSIS — M545 Low back pain, unspecified: Secondary | ICD-10-CM | POA: Diagnosis not present

## 2021-09-30 DIAGNOSIS — Z794 Long term (current) use of insulin: Secondary | ICD-10-CM | POA: Diagnosis not present

## 2021-09-30 DIAGNOSIS — G8929 Other chronic pain: Secondary | ICD-10-CM

## 2021-09-30 DIAGNOSIS — R1084 Generalized abdominal pain: Secondary | ICD-10-CM | POA: Diagnosis not present

## 2021-09-30 DIAGNOSIS — R29898 Other symptoms and signs involving the musculoskeletal system: Secondary | ICD-10-CM | POA: Diagnosis not present

## 2021-09-30 DIAGNOSIS — E1151 Type 2 diabetes mellitus with diabetic peripheral angiopathy without gangrene: Secondary | ICD-10-CM

## 2021-09-30 DIAGNOSIS — K219 Gastro-esophageal reflux disease without esophagitis: Secondary | ICD-10-CM

## 2021-09-30 DIAGNOSIS — G629 Polyneuropathy, unspecified: Secondary | ICD-10-CM | POA: Diagnosis not present

## 2021-09-30 DIAGNOSIS — Z23 Encounter for immunization: Secondary | ICD-10-CM

## 2021-09-30 MED ORDER — NITROGLYCERIN 0.4 MG SL SUBL: 0.4000 mg | SUBLINGUAL_TABLET | SUBLINGUAL | 3 refills | Status: DC | PRN

## 2021-09-30 NOTE — Progress Notes (Signed)
I,April Miller,acting as a scribe for Wilhemena Durie, MD.,have documented all relevant documentation on the behalf of Wilhemena Durie, MD,as directed by  Wilhemena Durie, MD while in the presence of Wilhemena Durie, MD.   Established patient visit   Patient: Joseph [name]   DOB: [birth year]   87 y.o. Male  MRN: [id number] Visit Date: 09/30/2021  Today's healthcare provider: Wilhemena Durie, MD   Chief Complaint  Patient presents with   Follow-up   Abdominal Pain   Subjective    HPI  Patient abdominal discomfort is much better.  Overall he is doing well and stable. He continues to feel lonely but not depressed or suicidal. Follow up for Generalized abdominal pain:  The patient was last seen for this 1 months ago. Changes made at last visit include; Advised patient to try Questran daily.  He reports good compliance with treatment. He feels that condition is Unchanged. He is not having side effects. none  ----------------------------------------------------------------------------------------- Patient states his abdominal pain has improved.  Last 2 days he has been on torsemide for fluid retention weight gain and mild CHF symptoms. Overall his weight is down in the past month however  Medications: Outpatient Medications Prior to Visit  Medication Sig   atorvastatin (LIPITOR) 80 MG tablet Take 1 tablet (80 mg total) by mouth daily.   Cholecalciferol 125 MCG (5000 UT) capsule Take 5,000 Units by mouth at bedtime.   cholestyramine (QUESTRAN) 4 g packet Take 1 packet (4 g total) by mouth 3 (three) times daily with meals.   clobetasol cream (TEMOVATE) 2.13 % Apply 1 application topically 2 (two) times daily. As needed to hands and elbows   clopidogrel (PLAVIX) 75 MG tablet Take 1 tablet (75 mg total) by mouth daily.   Continuous Blood Gluc Sensor (FREESTYLE LIBRE 14 DAY SENSOR) MISC USE AS DIRECTED EVERY 14 DAYS   hydrocortisone 2.5 % cream APPLY  TOPICALLY TWICE DAILY AS NEEDED TO FACE, CREASE, GROIN AND BUTT   insulin aspart (NOVOLOG) 100 UNIT/ML injection Inject into the skin 3 (three) times daily before meals. Inject 34 units in the morning / 24 units at 12:00pm / and 14 units at night time.   insulin glargine (LANTUS) 100 UNIT/ML injection Inject 50 Units into the skin daily.   ketoconazole (NIZORAL) 2 % cream APPLY TOPICALLY TWICE DAILY AS NEEDED FOR IRRITATION TO GROIN, FACE, AND BUTTOCKS   levothyroxine (SYNTHROID) 50 MCG tablet TAKE 1 TABLET BY MOUTH ONCE DAILY BEFORE BREAKFAST   losartan (COZAAR) 100 MG tablet Take 1 tablet (100 mg total) by mouth daily.   Magnesium 250 MG TABS Take 250 mg by mouth as needed (swelling). Take when you take your Torsemide.   mometasone (ELOCON) 0.1 % ointment Apply topically daily. As needed to scalp and ears   omeprazole (PRILOSEC) 20 MG capsule Take 1 capsule by mouth once daily   potassium chloride (KLOR-CON) 10 MEQ tablet Take 1 tablet (10 mEq total) by mouth every Monday, Wednesday, and Friday.   torsemide (DEMADEX) 20 MG tablet Take 1 tablet (20 mg total) by mouth every Monday, Wednesday, and Friday.   [DISCONTINUED] nitroGLYCERIN (NITROSTAT) 0.4 MG SL tablet Place 1 tablet (0.4 mg total) under the tongue every 5 (five) minutes as needed for chest pain.   aspirin EC 81 MG tablet Take 1 tablet (81 mg total) by mouth daily. (Patient not taking: No sig reported)   No facility-administered medications prior to visit.    Review of  Systems  Constitutional:  Negative for appetite change, chills and fever.  Respiratory:  Negative for chest tightness, shortness of breath and wheezing.   Cardiovascular:  Negative for chest pain and palpitations.  Gastrointestinal:  Negative for abdominal pain, nausea and vomiting.       Objective    BP (!) 163/65 (BP Location: Right Arm, Patient Position: Sitting, Cuff Size: Large)   Pulse 84   Temp 98.3 F (36.8 C) (Oral)   Resp 18   Ht 5\' 7"  (1.702 m)    Wt 217 lb (98.4 kg)   SpO2 95%   BMI 33.99 kg/m  BP Readings from Last 3 Encounters:  09/30/21 (!) 163/65  09/20/21 138/62  09/02/21 (!) 176/68   Wt Readings from Last 3 Encounters:  09/30/21 217 lb (98.4 kg)  09/20/21 221 lb 4 oz (100.4 kg)  09/02/21 224 lb (101.6 kg)      Physical Exam Vitals reviewed.  Constitutional:      Appearance: He is obese.  HENT:     Head: Normocephalic and atraumatic.     Right Ear: External ear normal.     Left Ear: External ear normal.     Mouth/Throat:     Pharynx: Oropharynx is clear.  Cardiovascular:     Rate and Rhythm: Normal rate and regular rhythm.     Heart sounds: Normal heart sounds.  Pulmonary:     Effort: Pulmonary effort is normal.     Breath sounds: Normal breath sounds.  Abdominal:     General: Bowel sounds are normal.     Palpations: Abdomen is soft.     Tenderness: There is no abdominal tenderness.  Musculoskeletal:     Right lower leg: Edema present.     Left lower leg: Edema present.     Comments: 1+ lower extremity edema  Skin:    General: Skin is warm and dry.     Comments: Venous stasis changes of the skin of lower extremities  Neurological:     General: No focal deficit present.     Mental Status: He is alert and oriented to person, place, and time.  Psychiatric:        Mood and Affect: Mood normal.        Behavior: Behavior normal.        Thought Content: Thought content normal.        Judgment: Judgment normal.      No results found for any visits on 09/30/21.  Assessment & Plan     1. Generalized abdominal pain Status postcholecystectomy markedly improved on Questran.  I would continue this. More than 50% 25 minute visit spent in counseling or coordination of care  2. Need for influenza vaccination  - Flu Vaccine QUAD High Dose(Fluad)  3. Type 2 diabetes mellitus with diabetic peripheral angiopathy without gangrene, with long-term current use of insulin ([place]) Followed by endocrinology  4.  Atherosclerosis of coronary artery bypass graft of native heart with stable angina pectoris ([place]) All risk factors treated. Aggressive medical management in this 87 year old  5. Gastroesophageal reflux disease without esophagitis   6. Adult hypothyroidism   7. Neuropathy   8. Bilateral leg weakness Continue to stay active  9. Morbid obesity due to excess calories (HCC) Weight down a little bit which is good  10. Bilateral leg edema Venous stasis changes along with some mild chronic CHF Torsemide presently  11. Chronic bilateral low back pain without sciatica    Return in about 4 months (around  01/28/2022).      I, Wilhemena Durie, MD, have reviewed all documentation for this visit. The documentation on 10/03/21 for the exam, diagnosis, procedures, and orders are all accurate and complete.    Latayna Ritchie Cranford Mon, MD  [facility] [phone] (phone) [phone] (fax)  [place]

## 2021-12-15 DIAGNOSIS — E1159 Type 2 diabetes mellitus with other circulatory complications: Secondary | ICD-10-CM | POA: Diagnosis not present

## 2021-12-15 LAB — HEMOGLOBIN A1C: Hemoglobin A1C: 7.8

## 2021-12-22 DIAGNOSIS — Z794 Long term (current) use of insulin: Secondary | ICD-10-CM | POA: Diagnosis not present

## 2021-12-22 DIAGNOSIS — E785 Hyperlipidemia, unspecified: Secondary | ICD-10-CM | POA: Diagnosis not present

## 2021-12-22 DIAGNOSIS — E1142 Type 2 diabetes mellitus with diabetic polyneuropathy: Secondary | ICD-10-CM | POA: Diagnosis not present

## 2021-12-22 DIAGNOSIS — E1159 Type 2 diabetes mellitus with other circulatory complications: Secondary | ICD-10-CM | POA: Diagnosis not present

## 2021-12-22 DIAGNOSIS — E1129 Type 2 diabetes mellitus with other diabetic kidney complication: Secondary | ICD-10-CM | POA: Diagnosis not present

## 2021-12-22 DIAGNOSIS — R809 Proteinuria, unspecified: Secondary | ICD-10-CM | POA: Diagnosis not present

## 2021-12-22 DIAGNOSIS — E1169 Type 2 diabetes mellitus with other specified complication: Secondary | ICD-10-CM | POA: Diagnosis not present

## 2022-01-05 ENCOUNTER — Encounter: Payer: Self-pay | Admitting: Family Medicine

## 2022-01-05 ENCOUNTER — Ambulatory Visit: Payer: Self-pay | Admitting: *Deleted

## 2022-01-05 ENCOUNTER — Other Ambulatory Visit: Payer: Self-pay

## 2022-01-05 ENCOUNTER — Ambulatory Visit (INDEPENDENT_AMBULATORY_CARE_PROVIDER_SITE_OTHER): Payer: HMO | Admitting: Family Medicine

## 2022-01-05 VITALS — BP 128/69 | HR 64 | Temp 97.8°F | Wt 220.0 lb

## 2022-01-05 DIAGNOSIS — I152 Hypertension secondary to endocrine disorders: Secondary | ICD-10-CM | POA: Insufficient documentation

## 2022-01-05 DIAGNOSIS — J0110 Acute frontal sinusitis, unspecified: Secondary | ICD-10-CM

## 2022-01-05 DIAGNOSIS — R051 Acute cough: Secondary | ICD-10-CM | POA: Insufficient documentation

## 2022-01-05 DIAGNOSIS — J00 Acute nasopharyngitis [common cold]: Secondary | ICD-10-CM | POA: Diagnosis not present

## 2022-01-05 DIAGNOSIS — E1159 Type 2 diabetes mellitus with other circulatory complications: Secondary | ICD-10-CM

## 2022-01-05 DIAGNOSIS — R599 Enlarged lymph nodes, unspecified: Secondary | ICD-10-CM | POA: Diagnosis not present

## 2022-01-05 MED ORDER — DM-GUAIFENESIN ER 30-600 MG PO TB12: 1.0000 | ORAL_TABLET | Freq: Two times a day (BID) | ORAL | 0 refills | Status: DC

## 2022-01-05 MED ORDER — PREDNISONE 5 MG PO TABS: 5.0000 mg | ORAL_TABLET | Freq: Every day | ORAL | 0 refills | Status: DC

## 2022-01-05 MED ORDER — AZITHROMYCIN 250 MG PO TABS: ORAL_TABLET | ORAL | 0 refills | Status: AC

## 2022-01-05 MED ORDER — CETIRIZINE HCL 10 MG PO TABS: 10.0000 mg | ORAL_TABLET | Freq: Every day | ORAL | 11 refills | Status: DC

## 2022-01-05 NOTE — Assessment & Plan Note (Signed)
Chronic, elevated; no changed made to POC given acute illness- recommend increased water intake to assist with medications Denies CP Denies SOB Denies DOE No LE Edema noted on exam Continue medication Refills stable Seek emergent care if you develop CP, chest pain or chest pressure

## 2022-01-05 NOTE — Assessment & Plan Note (Signed)
Likely bronchospasm, continue water to assist use of mucinex

## 2022-01-05 NOTE — Assessment & Plan Note (Signed)
Use of low dose oral steroid to assist with sinusitis

## 2022-01-05 NOTE — Progress Notes (Signed)
Acute Office Visit  Subjective:    Patient ID: Joseph [name], male    DOB: [birth year], 88 y.o.   MRN: [id number]  No chief complaint on file.   HPI Patient is in today for evaluatio of cough, sore throat, runny nose and congestion.  He has tested negative for Covid.  Patient states his symptoms began 3-4 days ago with runny nose.  Then he began having cough and sore throat and now his cough is productive of grayish green sputum.  He states it hurts his head when he has to cough or blow his nose.   Patient is fearful of getting pneumonia.  He has been using Tussin DM for cough/congestion.  Past Medical History:  Diagnosis Date   Acute respiratory failure with hypoxia ([place]) 03/21/2016   CAD (coronary artery disease)    a. s/p CABG;  b. 07/2012 Cath: 3VD including 80 dLCX (med Rx), 4/4 patent grafts.   Chronic diastolic CHF (congestive heart failure) ([place])    a. 07/2012 Echo: EF 55-60%, no rwma, Gr 1 DD, mild MR;  04/2014 Echo: EF 55-60%, no rwma, mild MR, mildly dil LA.   Diabetes mellitus, type 2 ([place])    HTN (hypertension)    Hyperlipidemia    Hypothyroidism    Lower extremity edema    a. 03/2014 amlodipine d/c'd.   Spinal stenosis     Past Surgical History:  Procedure Laterality Date   APPENDECTOMY     CARDIAC CATHETERIZATION  07/2012   [facility]/no stents   CHOLECYSTECTOMY     CORNEAL TRANSPLANT     CORONARY ARTERY BYPASS GRAFT  1994   HEMORRHOID SURGERY     LEFT HEART CATH AND CORS/GRAFTS ANGIOGRAPHY N/A 12/17/2019   Procedure: LEFT HEART CATH AND CORS/GRAFTS ANGIOGRAPHY;  Surgeon: Nelva Bush, MD;  Location: [facility];  Service: Cardiovascular;  Laterality: N/A;   PROSTATE BIOPSY     TOE SURGERY     ingrown toe nail    Family History  Problem Relation Age of Onset   Heart failure Mother    Heart attack Father    Stroke Brother    Heart failure Maternal Uncle    Heart failure Maternal Grandfather    Sleep apnea Daughter    Sleep apnea Son    Kidney  Stones Son     Social History   Socioeconomic History   Marital status: Widowed    Spouse name: Not on file   Number of children: 3   Years of education: Not on file   Highest education level: Some college, no degree  Occupational History   Occupation: retired  Tobacco Use   Smoking status: Former    Packs/day: 0.00    Years: 6.00    Pack years: 0.00    Types: Pipe, Cigars, Cigarettes    Quit date: 12/23/1980    Years since quitting: 41.0   Smokeless tobacco: Never  Vaping Use   Vaping Use: Never used  Substance and Sexual Activity   Alcohol use: No   Drug use: No   Sexual activity: Never  Other Topics Concern   Not on file  Social History Narrative   Retired.    Social Determinants of Health   Financial Resource Strain: Medium Risk   Difficulty of Paying Living Expenses: Somewhat hard  Food Insecurity: No Food Insecurity   Worried About Running Out of Food in the Last Year: Never true   Ran Out of Food in the Last Year: Never true  Transportation Needs: No Data processing manager (Medical): No   Lack of Transportation (Non-Medical): No  Physical Activity: Inactive   Days of Exercise per Week: 0 days   Minutes of Exercise per Session: 0 min  Stress: No Stress Concern Present   Feeling of Stress : Not at all  Social Connections: Socially Integrated   Frequency of Communication with Friends and Family: More than three times a week   Frequency of Social Gatherings with Friends and Family: More than three times a week   Attends Religious Services: More than 4 times per year   Active Member of [employer] or Organizations: Yes   Attends Music therapist: More than 4 times per year   Marital Status: Married  Human resources officer Violence: Not At Risk   Fear of Current or Ex-Partner: No   Emotionally Abused: No   Physically Abused: No   Sexually Abused: No    Outpatient Medications Prior to Visit  Medication Sig Dispense Refill    Cholecalciferol 125 MCG (5000 UT) capsule Take 5,000 Units by mouth at bedtime.     cholestyramine (QUESTRAN) 4 g packet Take 1 packet (4 g total) by mouth 3 (three) times daily with meals. 60 each 12   clobetasol cream (TEMOVATE) 2.20 % Apply 1 application topically 2 (two) times daily. As needed to hands and elbows     clopidogrel (PLAVIX) 75 MG tablet Take 1 tablet (75 mg total) by mouth daily. 90 tablet 3   Continuous Blood Gluc Sensor (FREESTYLE LIBRE 14 DAY SENSOR) MISC USE AS DIRECTED EVERY 14 DAYS 2 each 0   hydrocortisone 2.5 % cream APPLY TOPICALLY TWICE DAILY AS NEEDED TO FACE, CREASE, GROIN AND BUTT 28 g 0   insulin aspart (NOVOLOG) 100 UNIT/ML injection Inject into the skin 3 (three) times daily before meals. Inject 34 units in the morning / 24 units at 12:00pm / and 14 units at night time.     insulin glargine (LANTUS) 100 UNIT/ML injection Inject 50 Units into the skin daily.     ketoconazole (NIZORAL) 2 % cream APPLY TOPICALLY TWICE DAILY AS NEEDED FOR IRRITATION TO GROIN, FACE, AND BUTTOCKS 15 g 0   levothyroxine (SYNTHROID) 50 MCG tablet TAKE 1 TABLET BY MOUTH ONCE DAILY BEFORE BREAKFAST 90 tablet 0   losartan (COZAAR) 100 MG tablet Take 1 tablet (100 mg total) by mouth daily. 90 tablet 3   Magnesium 250 MG TABS Take 250 mg by mouth as needed (swelling). Take when you take your Torsemide.     mometasone (ELOCON) 0.1 % ointment Apply topically daily. As needed to scalp and ears     nitroGLYCERIN (NITROSTAT) 0.4 MG SL tablet Place 1 tablet (0.4 mg total) under the tongue every 5 (five) minutes as needed for chest pain. 25 tablet 3   omeprazole (PRILOSEC) 20 MG capsule Take 1 capsule by mouth once daily 90 capsule 3   potassium chloride (KLOR-CON) 10 MEQ tablet Take 1 tablet (10 mEq total) by mouth every Monday, Wednesday, and Friday. 39 tablet 3   torsemide (DEMADEX) 20 MG tablet Take 1 tablet (20 mg total) by mouth every Monday, Wednesday, and Friday. 39 tablet 3   aspirin EC 81 MG  tablet Take 1 tablet (81 mg total) by mouth daily. (Patient not taking: Reported on 09/20/2021) 90 tablet 3   atorvastatin (LIPITOR) 80 MG tablet Take 1 tablet (80 mg total) by mouth daily. 90 tablet 3   No facility-administered medications prior to  visit.    No Known Allergies  Review of Systems  Constitutional:  Negative for fatigue and fever.  HENT:  Positive for congestion, rhinorrhea and sore throat. Negative for ear discharge, ear pain, postnasal drip, sinus pressure, sinus pain and sneezing.   Respiratory:  Positive for cough. Negative for shortness of breath and wheezing.   Cardiovascular:  Negative for chest pain.  Gastrointestinal:  Negative for abdominal pain.  Musculoskeletal:  Negative for myalgias.  Neurological:  Negative for dizziness and light-headedness. Headaches: only when he has to cough.     Objective:    Physical Exam Vitals and nursing note reviewed.  Constitutional:      Appearance: Normal appearance. He is obese.  HENT:     Head: Normocephalic and atraumatic.     Right Ear: Tympanic membrane, ear canal and external ear normal. There is no impacted cerumen.     Left Ear: Tympanic membrane, ear canal and external ear normal. There is no impacted cerumen.     Nose: Congestion and rhinorrhea present.     Mouth/Throat:     Mouth: Mucous membranes are moist.     Pharynx: Oropharynx is clear. No oropharyngeal exudate or posterior oropharyngeal erythema.  Eyes:     Pupils: Pupils are equal, round, and reactive to light.  Cardiovascular:     Rate and Rhythm: Normal rate and regular rhythm.     Pulses: Normal pulses.     Heart sounds: Normal heart sounds.  Pulmonary:     Effort: Pulmonary effort is normal.     Breath sounds: Normal breath sounds.  Abdominal:     Palpations: Abdomen is soft.  Musculoskeletal:        General: Normal range of motion.     Cervical back: Normal range of motion.  Skin:    General: Skin is warm and dry.     Capillary Refill:  Capillary refill takes less than 2 seconds.  Neurological:     General: No focal deficit present.     Mental Status: He is alert and oriented to person, place, and time. Mental status is at baseline.  Psychiatric:        Mood and Affect: Mood normal.        Behavior: Behavior normal.        Thought Content: Thought content normal.        Judgment: Judgment normal.    BP 128/69 Comment: home reading   Pulse 64    Temp 97.8 F (36.6 C) (Oral)    Wt 220 lb (99.8 kg)    SpO2 96%    BMI 34.46 kg/m  Wt Readings from Last 3 Encounters:  01/05/22 220 lb (99.8 kg)  09/30/21 217 lb (98.4 kg)  09/20/21 221 lb 4 oz (100.4 kg)   Vitals:   01/05/22 1410 01/05/22 1413 01/05/22 1435  BP: (!) 189/88 (!) 179/75 128/69  Pulse: 64    Temp: 97.8 F (36.6 C)    TempSrc: Oral    SpO2: 96%    Weight: 220 lb (99.8 kg)      Health Maintenance Due  Topic Date Due   Zoster Vaccines- Shingrix (1 of 2) Never done   COVID-19 Vaccine (3 - Pfizer risk series) 04/15/2020   OPHTHALMOLOGY EXAM  11/17/2020   FOOT EXAM  09/22/2021    There are no preventive care reminders to display for this patient.   Lab Results  Component Value Date   TSH 2.220 05/11/2020   Lab Results  Component  Value Date   WBC 5.6 05/11/2020   HGB 13.8 05/11/2020   HCT 40.6 05/11/2020   MCV 89 05/11/2020   PLT 222 05/11/2020   Lab Results  Component Value Date   NA 140 03/11/2021   K 4.5 03/11/2021   CO2 22 03/11/2021   GLUCOSE 280 (H) 03/11/2021   BUN 16 03/11/2021   CREATININE 1.08 03/11/2021   BILITOT 0.6 12/16/2019   ALKPHOS 47 12/16/2019   AST 24 12/16/2019   ALT 21 12/16/2019   PROT 6.8 12/16/2019   ALBUMIN 3.4 (L) 12/16/2019   CALCIUM 8.8 03/11/2021   ANIONGAP 5 06/22/2020   EGFR 64 03/11/2021   Lab Results  Component Value Date   CHOL 121 12/17/2019   Lab Results  Component Value Date   HDL 34 (L) 12/17/2019   Lab Results  Component Value Date   LDLCALC 67 12/17/2019   Lab Results   Component Value Date   TRIG 98 12/17/2019   Lab Results  Component Value Date   CHOLHDL 3.6 12/17/2019   Lab Results  Component Value Date   HGBA1C 8.3 08/11/2021       Assessment & Plan:   Problem List Items Addressed This Visit       Cardiovascular and Mediastinum   Hypertension associated with diabetes ([place])    Chronic, elevated; no changed made to POC given acute illness- recommend increased water intake to assist with medications Denies CP Denies SOB Denies DOE No LE Edema noted on exam Continue medication Refills stable Seek emergent care if you develop CP, chest pain or chest pressure         Respiratory   Acute non-recurrent frontal sinusitis - Primary    Abx provided; f/u with PCP if symptoms linger or worsen Recommend OTC, over the counter purchase, to assist with recovery:  Nasal spray- Ocean nasal spray or similar, to ensure nasal passageways are moist and not dry  Nasal steroid- Flonase, or similar, assist with decrease inflammation, may not notice effect for 4 days or longer, can take 2 x/day. Best to blow nose prior to use.   Delym or Robitussin DM- to assist with cough, follow directions on package  Mucinex DM- ensure 32-64 oz of water to activate  Throat lozenges, sugar free candies, water- keep throat moistened        Relevant Medications   azithromycin (ZITHROMAX) 250 MG tablet   predniSONE (DELTASONE) 5 MG tablet   cetirizine (ZYRTEC) 10 MG tablet   dextromethorphan-guaiFENesin (MUCINEX DM) 30-600 MG 12hr tablet   Acute rhinitis    Add oral anti histamine to assist with current symptoms and recovery       Relevant Medications   cetirizine (ZYRTEC) 10 MG tablet     Immune and Lymphatic   Glands swollen    Use of low dose oral steroid to assist with sinusitis       Relevant Medications   predniSONE (DELTASONE) 5 MG tablet     Other   Acute cough    Likely bronchospasm, continue water to assist use of mucinex      Relevant  Medications   dextromethorphan-guaiFENesin (MUCINEX DM) 30-600 MG 12hr tablet      Meds ordered this encounter  Medications   azithromycin (ZITHROMAX) 250 MG tablet    Sig: Take 2 tablets on day 1, then 1 tablet daily on days 2 through 5    Dispense:  6 tablet    Refill:  0    Order Specific Question:  Supervising Provider    Answer:   Virginia Crews [[id number]]   predniSONE (DELTASONE) 5 MG tablet    Sig: Take 1 tablet (5 mg total) by mouth daily with breakfast.    Dispense:  5 tablet    Refill:  0    Order Specific Question:   Supervising Provider    Answer:   Virginia Crews [[id number]]   cetirizine (ZYRTEC) 10 MG tablet    Sig: Take 1 tablet (10 mg total) by mouth daily.    Dispense:  30 tablet    Refill:  11    Order Specific Question:   Supervising Provider    Answer:   Virginia Crews [[id number]]   dextromethorphan-guaiFENesin (MUCINEX DM) 30-600 MG 12hr tablet    Sig: Take 1 tablet by mouth 2 (two) times daily.    Dispense:  12 tablet    Refill:  0    Order Specific Question:   Supervising Provider    Answer:   Virginia Crews [[id number]]   Argentina Ponder De[place],acting as a scribe for Gwyneth Sprout, FNP.,have documented all relevant documentation on the behalf of Gwyneth Sprout, FNP,as directed by  Gwyneth Sprout, FNP while in the presence of Gwyneth Sprout, FNP.  Vonna Kotyk, FNP, have reviewed all documentation for this visit. The documentation on 01/05/22 for the exam, diagnosis, procedures, and orders are all accurate and complete.

## 2022-01-05 NOTE — Assessment & Plan Note (Signed)
Abx provided; f/u with PCP if symptoms linger or worsen Recommend OTC, over the counter purchase, to assist with recovery:  Nasal spray- Ocean nasal spray or similar, to ensure nasal passageways are moist and not dry  Nasal steroid- Flonase, or similar, assist with decrease inflammation, may not notice effect for 4 days or longer, can take 2 x/day. Best to blow nose prior to use.   Delym or Robitussin DM- to assist with cough, follow directions on package  Mucinex DM- ensure 32-64 oz of water to activate  Throat lozenges, sugar free candies, water- keep throat moistened

## 2022-01-05 NOTE — Assessment & Plan Note (Signed)
Add oral anti histamine to assist with current symptoms and recovery

## 2022-01-05 NOTE — Telephone Encounter (Signed)
°  Chief Complaint: requesting appt cough Symptoms: productive cough yellow , gray mucus sore throat runny nose  Frequency: x 2-3 days  Pertinent Negatives: Patient denies chest pain difficulty breathing, fever, has not tested for covid Disposition: [] ED /[] Urgent Care (no appt availability in office) / [x] Appointment(In office/virtual)/ []  [place] [facility]/ [] Home Care/ [] Refused Recommended Disposition /[] [place] [facility]/ []  Follow-up with PCP Additional Notes:   VV scheduled for today with Dickie La, NP    Reason for Disposition  Cough  Answer Assessment - Initial Assessment Questions 1. ONSET: "When did the cough begin?"      2-3 days  2. SEVERITY: "How bad is the cough today?"      Getting  3. SPUTUM: "Describe the color of your sputum" (none, dry cough; clear, white, yellow, green)     Yellow and gray color thick 4. HEMOPTYSIS: "Are you coughing up any blood?" If so ask: "How much?" (flecks, streaks, tablespoons, etc.)     na 5. DIFFICULTY BREATHING: "Are you having difficulty breathing?" If Yes, ask: "How bad is it?" (e.g., mild, moderate, severe)    - MILD: No SOB at rest, mild SOB with walking, speaks normally in sentences, can lie down, no retractions, pulse < 100.    - MODERATE: SOB at rest, SOB with minimal exertion and prefers to sit, cannot lie down flat, speaks in phrases, mild retractions, audible wheezing, pulse 100-120.    - SEVERE: Very SOB at rest, speaks in single words, struggling to breathe, sitting hunched forward, retractions, pulse > 120      No  6. FEVER: "Do you have a fever?" If Yes, ask: "What is your temperature, how was it measured, and when did it start?"     na 7. CARDIAC HISTORY: "Do you have any history of heart disease?" (e.g., heart attack, congestive heart failure)      Heart issues  8. LUNG HISTORY: "Do you have any history of lung disease?"  (e.g., pulmonary embolus, asthma, emphysema)     na 9. PE RISK FACTORS: "Do you have a  history of blood clots?" (or: recent major surgery, recent prolonged travel, bedridden)     na 10. OTHER SYMPTOMS: "Do you have any other symptoms?" (e.g., runny nose, wheezing, chest pain)       Runny nose constant runny clear mucus nose bleed few days ago  11. PREGNANCY: "Is there any chance you are pregnant?" "When was your last menstrual period?"       na 12. TRAVEL: "Have you traveled out of the country in the last month?" (e.g., travel history, exposures)       na  Protocols used: Cough - Acute Productive-A-AH

## 2022-01-06 ENCOUNTER — Telehealth: Payer: HMO | Admitting: Family Medicine

## 2022-01-18 NOTE — Progress Notes (Signed)
? ?I,Elena D DeSanto,acting as a scribe for Wilhemena Durie, MD.,have documented all relevant documentation on the behalf of Wilhemena Durie, MD,as directed by  Wilhemena Durie, MD while in the presence of Wilhemena Durie, MD. ?  ? ? ?Established patient visit ? ? ?Patient: Joseph [name]   DOB: [birth year]   88 y.o. Male  MRN: [id number] ?Visit Date: 01/19/2022 ? ?Today's healthcare provider: Wilhemena Durie, MD  ? ?No chief complaint on file. ? ?Subjective  ?  ?HPI  ?Patient continues to live independently.  He feels pretty well. ?Chronic mild bowel issues. ?No chest pain or breathing difficulties. ?Hypertension, follow-up ? ?BP Readings from Last 3 Encounters:  ?01/05/22 128/69  ?09/30/21 (!) 163/65  ?09/20/21 138/62  ? Wt Readings from Last 3 Encounters:  ?01/05/22 220 lb (99.8 kg)  ?09/30/21 217 lb (98.4 kg)  ?09/20/21 221 lb 4 oz (100.4 kg)  ?  ? ?He was last seen for hypertension 4 months ago.  ?BP at that visit was as above. Management since that visit includes none. ? ?He reports good compliance with treatment. ?He is not having side effects.  ? ?He is following a Regular diet. ?He is exercising...walking  ?He does not smoke. ? ?Use of agents associated with hypertension: none.  ? ?Outside blood pressures are . ?Symptoms: ?No chest pain No chest pressure  ?No palpitations No syncope  ?No dyspnea No orthopnea  ?No paroxysmal nocturnal dyspnea No lower extremity edema  ? ?Pertinent labs: ?Lab Results  ?Component Value Date  ? CHOL 121 12/17/2019  ? HDL 34 (L) 12/17/2019  ? [place] 67 12/17/2019  ? TRIG 98 12/17/2019  ? CHOLHDL 3.6 12/17/2019  ? Lab Results  ?Component Value Date  ? NA 140 03/11/2021  ? K 4.5 03/11/2021  ? CREATININE 1.08 03/11/2021  ? EGFR 64 03/11/2021  ? GLUCOSE 280 (H) 03/11/2021  ? TSH 2.220 05/11/2020  ?  ? ?The ASCVD Risk score (Arnett DK, et al., 2019) failed to calculate for the following reasons: ?  The 2019 ASCVD risk score is only valid for ages 45 to 69 ?  The  patient has a prior MI or stroke diagnosis  ? ?--------------------------------------------------------------------------------------------------- ? ? ?Medications: ?Outpatient Medications Prior to Visit  ?Medication Sig  ? aspirin EC 81 MG tablet Take 1 tablet (81 mg total) by mouth daily.  ? cetirizine (ZYRTEC) 10 MG tablet Take 1 tablet (10 mg total) by mouth daily.  ? Cholecalciferol 125 MCG (5000 UT) capsule Take 5,000 Units by mouth at bedtime.  ? cholestyramine (QUESTRAN) 4 g packet Take 1 packet (4 g total) by mouth 3 (three) times daily with meals.  ? clobetasol cream (TEMOVATE) 2.83 % Apply 1 application topically 2 (two) times daily. As needed to hands and elbows  ? Continuous Blood Gluc Sensor (FREESTYLE LIBRE 14 DAY SENSOR) MISC USE AS DIRECTED EVERY 14 DAYS  ? dextromethorphan-guaiFENesin (MUCINEX DM) 30-600 MG 12hr tablet Take 1 tablet by mouth 2 (two) times daily.  ? hydrocortisone 2.5 % cream APPLY TOPICALLY TWICE DAILY AS NEEDED TO FACE, CREASE, GROIN AND BUTT  ? insulin aspart (NOVOLOG) 100 UNIT/ML injection Inject into the skin 3 (three) times daily before meals. Inject 34 units in the morning / 24 units at 12:00pm / and 14 units at night time.  ? insulin glargine (LANTUS) 100 UNIT/ML injection Inject 50 Units into the skin daily.  ? ketoconazole (NIZORAL) 2 % cream APPLY TOPICALLY TWICE DAILY AS NEEDED FOR IRRITATION TO  GROIN, FACE, AND BUTTOCKS  ? levothyroxine (SYNTHROID) 50 MCG tablet TAKE 1 TABLET BY MOUTH ONCE DAILY BEFORE BREAKFAST  ? losartan (COZAAR) 100 MG tablet Take 1 tablet (100 mg total) by mouth daily.  ? Magnesium 250 MG TABS Take 250 mg by mouth as needed (swelling). Take when you take your Torsemide.  ? mometasone (ELOCON) 0.1 % ointment Apply topically daily. As needed to scalp and ears  ? nitroGLYCERIN (NITROSTAT) 0.4 MG SL tablet Place 1 tablet (0.4 mg total) under the tongue every 5 (five) minutes as needed for chest pain.  ? omeprazole (PRILOSEC) 20 MG capsule Take 1  capsule by mouth once daily  ? potassium chloride (KLOR-CON) 10 MEQ tablet Take 1 tablet (10 mEq total) by mouth every Monday, Wednesday, and Friday.  ? predniSONE (DELTASONE) 5 MG tablet Take 1 tablet (5 mg total) by mouth daily with breakfast.  ? torsemide (DEMADEX) 20 MG tablet Take 1 tablet (20 mg total) by mouth every Monday, Wednesday, and Friday.  ? atorvastatin (LIPITOR) 80 MG tablet Take 1 tablet (80 mg total) by mouth daily.  ? clopidogrel (PLAVIX) 75 MG tablet Take 1 tablet (75 mg total) by mouth daily. (Patient not taking: Reported on 01/19/2022)  ? ?No facility-administered medications prior to visit.  ? ? ?Review of Systems ? ?  ?  Objective  ?  ?There were no vitals taken for this visit. ?BP Readings from Last 3 Encounters:  ?01/19/22 (!) 150/68  ?01/05/22 128/69  ?09/30/21 (!) 163/65  ? ?Wt Readings from Last 3 Encounters:  ?01/19/22 213 lb 6.4 oz (96.8 kg)  ?01/05/22 220 lb (99.8 kg)  ?09/30/21 217 lb (98.4 kg)  ? ?  ? ?Physical Exam ?Vitals reviewed.  ?Constitutional:   ?   Appearance: He is obese.  ?HENT:  ?   Head: Normocephalic and atraumatic.  ?   Right Ear: External ear normal.  ?   Left Ear: External ear normal.  ?   Mouth/Throat:  ?   Pharynx: Oropharynx is clear.  ?Cardiovascular:  ?   Rate and Rhythm: Normal rate and regular rhythm.  ?   Heart sounds: Normal heart sounds.  ?Pulmonary:  ?   Breath sounds: Normal breath sounds.  ?Abdominal:  ?   General: Bowel sounds are normal.  ?   Palpations: Abdomen is soft.  ?   Tenderness: There is no abdominal tenderness.  ?Musculoskeletal:  ?   Right lower leg: Edema present.  ?   Left lower leg: Edema present.  ?   Comments: 1+ lower extremity edema  ?Skin: ?   General: Skin is warm and dry.  ?   Comments: Venous stasis changes of the skin of lower extremities  ?Neurological:  ?   General: No focal deficit present.  ?   Mental Status: He is alert and oriented to person, place, and time.  ?Psychiatric:     ?   Mood and Affect: Mood normal.     ?    Behavior: Behavior normal.     ?   Thought Content: Thought content normal.     ?   Judgment: Judgment normal.  ?  ? ? ?No results found for any visits on 01/19/22. ? Assessment & Plan  ?  ? ?1. Atherosclerosis of coronary artery bypass graft of native heart with stable angina pectoris ([place]) ?All risk Factors treated. ? ?2.Chronic CHF ([place]) ?Clinically stable.  Followed regularly by cardiology. ? ?3. Type 2 diabetes mellitus with diabetic peripheral angiopathy without  gangrene, with long-term current use of insulin ([place]) ?Followed by endocrinology. ? ?4. Essential hypertension ?Control in 88 year old gentleman. ? ?5. Adult hypothyroidism ?TSH yearly ? ?6. Neuropathy ? ? ? ?No follow-ups on file.  ?   ? ?I, Wilhemena Durie, MD, have reviewed all documentation for this visit. The documentation on 01/21/22 for the exam, diagnosis, procedures, and orders are all accurate and complete. ? ? ? ?Blanca Thornton Cranford Mon, MD  ?[facility] ?[phone] (phone) ?[phone] (fax) ? ?[facility] ?

## 2022-01-19 ENCOUNTER — Encounter: Payer: Self-pay | Admitting: Family Medicine

## 2022-01-19 ENCOUNTER — Ambulatory Visit (INDEPENDENT_AMBULATORY_CARE_PROVIDER_SITE_OTHER): Payer: HMO | Admitting: Family Medicine

## 2022-01-19 ENCOUNTER — Other Ambulatory Visit: Payer: Self-pay

## 2022-01-19 VITALS — BP 150/68 | HR 98 | Temp 98.5°F | Resp 14 | Wt 213.4 lb

## 2022-01-19 DIAGNOSIS — G629 Polyneuropathy, unspecified: Secondary | ICD-10-CM | POA: Diagnosis not present

## 2022-01-19 DIAGNOSIS — E1151 Type 2 diabetes mellitus with diabetic peripheral angiopathy without gangrene: Secondary | ICD-10-CM | POA: Diagnosis not present

## 2022-01-19 DIAGNOSIS — E039 Hypothyroidism, unspecified: Secondary | ICD-10-CM | POA: Diagnosis not present

## 2022-01-19 DIAGNOSIS — I25708 Atherosclerosis of coronary artery bypass graft(s), unspecified, with other forms of angina pectoris: Secondary | ICD-10-CM | POA: Diagnosis not present

## 2022-01-19 DIAGNOSIS — Z794 Long term (current) use of insulin: Secondary | ICD-10-CM

## 2022-01-19 DIAGNOSIS — I249 Acute ischemic heart disease, unspecified: Secondary | ICD-10-CM | POA: Diagnosis not present

## 2022-01-19 DIAGNOSIS — I10 Essential (primary) hypertension: Secondary | ICD-10-CM

## 2022-01-19 NOTE — Patient Instructions (Signed)
Over the Counter Metamucil, 1 packet each day for stools. ?

## 2022-02-03 ENCOUNTER — Ambulatory Visit: Payer: HMO | Admitting: Physician Assistant

## 2022-02-03 ENCOUNTER — Telehealth: Payer: Self-pay

## 2022-02-03 ENCOUNTER — Ambulatory Visit: Payer: Self-pay

## 2022-02-03 NOTE — Telephone Encounter (Signed)
Please advise 

## 2022-02-03 NOTE — Progress Notes (Deleted)
?  ? ?I,Khalifa Knecht E Verdon Ferrante,acting as a scribe for [employer], PA-C.,have documented all relevant documentation on the behalf of [place], PA-C,as directed by  [employer], PA-C while in the presence of Erin E Mecum, PA-C.  ? ?Established patient visit ? ? ?Patient: Joseph [name]   DOB: Mar 08, 1925   88 y.o. Male  MRN: [id number] ?Visit Date: 02/03/2022 ? ?Today's healthcare provider: Dani Gobble Mecum, PA-C  ? ?No chief complaint on file. ? ?Subjective  ?  ?HPI ? ? ?Medications: ?Outpatient Medications Prior to Visit  ?Medication Sig  ? aspirin EC 81 MG tablet Take 1 tablet (81 mg total) by mouth daily.  ? atorvastatin (LIPITOR) 80 MG tablet Take 1 tablet (80 mg total) by mouth daily.  ? cetirizine (ZYRTEC) 10 MG tablet Take 1 tablet (10 mg total) by mouth daily.  ? Cholecalciferol 125 MCG (5000 UT) capsule Take 5,000 Units by mouth at bedtime.  ? cholestyramine (QUESTRAN) 4 g packet Take 1 packet (4 g total) by mouth 3 (three) times daily with meals.  ? clobetasol cream (TEMOVATE) 7.89 % Apply 1 application topically 2 (two) times daily. As needed to hands and elbows  ? clopidogrel (PLAVIX) 75 MG tablet Take 1 tablet (75 mg total) by mouth daily. (Patient not taking: Reported on 01/19/2022)  ? Continuous Blood Gluc Sensor (FREESTYLE LIBRE 14 DAY SENSOR) MISC USE AS DIRECTED EVERY 14 DAYS  ? dextromethorphan-guaiFENesin (MUCINEX DM) 30-600 MG 12hr tablet Take 1 tablet by mouth 2 (two) times daily.  ? hydrocortisone 2.5 % cream APPLY TOPICALLY TWICE DAILY AS NEEDED TO FACE, CREASE, GROIN AND BUTT  ? insulin aspart (NOVOLOG) 100 UNIT/ML injection Inject into the skin 3 (three) times daily before meals. Inject 34 units in the morning / 24 units at 12:00pm / and 14 units at night time.  ? insulin glargine (LANTUS) 100 UNIT/ML injection Inject 50 Units into the skin daily.  ? ketoconazole (NIZORAL) 2 % cream APPLY TOPICALLY TWICE DAILY AS NEEDED FOR IRRITATION TO GROIN, FACE, AND BUTTOCKS  ? levothyroxine (SYNTHROID) 50  MCG tablet TAKE 1 TABLET BY MOUTH ONCE DAILY BEFORE BREAKFAST  ? losartan (COZAAR) 100 MG tablet Take 1 tablet (100 mg total) by mouth daily.  ? Magnesium 250 MG TABS Take 250 mg by mouth as needed (swelling). Take when you take your Torsemide.  ? mometasone (ELOCON) 0.1 % ointment Apply topically daily. As needed to scalp and ears  ? nitroGLYCERIN (NITROSTAT) 0.4 MG SL tablet Place 1 tablet (0.4 mg total) under the tongue every 5 (five) minutes as needed for chest pain.  ? omeprazole (PRILOSEC) 20 MG capsule Take 1 capsule by mouth once daily  ? potassium chloride (KLOR-CON) 10 MEQ tablet Take 1 tablet (10 mEq total) by mouth every Monday, Wednesday, and Friday.  ? predniSONE (DELTASONE) 5 MG tablet Take 1 tablet (5 mg total) by mouth daily with breakfast.  ? torsemide (DEMADEX) 20 MG tablet Take 1 tablet (20 mg total) by mouth every Monday, Wednesday, and Friday.  ? ?No facility-administered medications prior to visit.  ? ? ?Review of Systems ? ?{Labs  Heme  Chem  Endocrine  Serology  Results Review (optional):23779} ?  Objective  ?  ?There were no vitals taken for this visit. ?{Show previous vital signs (optional):23777} ? ?Physical Exam  ?*** ? ?No results found for any visits on 02/03/22. ? Assessment & Plan  ?  ? ?*** ? ?No follow-ups on file.  ?   ? ?{  provider attestation***:1} ? ? ?Erin E Mecum, PA-C  ?[place] ?[phone] (phone) ?[phone] (fax) ? ?[facility]  ?

## 2022-02-03 NOTE — Telephone Encounter (Signed)
Copied from [place] [id number]. Topic: General - Other ?>> Feb 03, 2022  8:32 AM Holley Dexter N wrote: ?Reason for CRM: Pts daughter called in stating she spoke with a on call Dr last night that the pt has not been himself, he is sleeping all the time, no energy, and stating it may be hyperglycemia and was advised to see PCP soon, they are available any time today and tomorrow, except for tomorrow pt does a 1:00 appt somewhere else, they were requesting to see if PCP could get him squeezed in. Please advise. ?

## 2022-02-03 NOTE — Telephone Encounter (Signed)
? ? ? ?  Chief Complaint: Fatigue ?Symptoms: Tired , sleeping a lot ?Frequency: Started Saturday ?Pertinent Negatives: Patient denies pain ?Disposition: '[]'$ ED /'[]'$ Urgent Care (no appt availability in office) / '[]'$ Appointment(In office/virtual)/ '[]'$  [place] [facility]/ '[]'$ Home Care/ '[]'$ Refused Recommended Disposition /'[]'$ [place] [facility]/ '[]'$  Follow-up with PCP ?Additional Notes: Daughter concerned about pt.'s lethargy, sleepiness. Has talked to endocrinology who recommend PCP visit. Message has been sent to PCP today. Please advise daighter.  ?Answer Assessment - Initial Assessment Questions ?1. DESCRIPTION: "Describe how you are feeling." ?    Tired, weak ?2. SEVERITY: "How bad is it?"  "Can you stand and walk?" ?  - MILD - Feels weak or tired, but does not interfere with work, school or normal activities ?  - MODERATE - Able to stand and walk; weakness interferes with work, school, or normal activities ?  - SEVERE - Unable to stand or walk ?    Moderate ?3. ONSET:  "When did the weakness begin?" ?    Saturday ?4. CAUSE: "What do you think is causing the weakness?" ?    Unsure ?5. MEDICINES: "Have you recently started a new medicine or had a change in the amount of a medicine?" ?    Yes- "diabetes medication" ?6. OTHER SYMPTOMS: "Do you have any other symptoms?" (e.g., chest pain, fever, cough, SOB, vomiting, diarrhea, bleeding, other areas of pain) ?    No ?7. PREGNANCY: "Is there any chance you are pregnant?" "When was your last menstrual period?" ?    N/a ? ?Protocols used: Weakness (Generalized) and Fatigue-A-AH ? ?

## 2022-02-03 NOTE — Telephone Encounter (Signed)
See other message where provider responded. ?

## 2022-02-03 NOTE — Telephone Encounter (Signed)
What about Joseph [name] this afternoon?

## 2022-02-03 NOTE — Telephone Encounter (Signed)
There are no available appointments on Dr. Marlan Palau schedule today. Please advise if patient can be worked in, or whether patient should be seen by another provider that  has an available appointment.  ?

## 2022-02-03 NOTE — Telephone Encounter (Signed)
Patient's daughter was advised. She declined appointment with another provider and stated she is going to call Dr. Rockey Situ. ?

## 2022-02-04 ENCOUNTER — Encounter: Payer: Self-pay | Admitting: Cardiovascular Disease

## 2022-02-04 ENCOUNTER — Other Ambulatory Visit: Payer: Self-pay

## 2022-02-04 ENCOUNTER — Ambulatory Visit (INDEPENDENT_AMBULATORY_CARE_PROVIDER_SITE_OTHER): Payer: HMO | Admitting: Cardiovascular Disease

## 2022-02-04 VITALS — BP 140/80 | Ht 67.0 in | Wt 216.0 lb

## 2022-02-04 DIAGNOSIS — I5032 Chronic diastolic (congestive) heart failure: Secondary | ICD-10-CM | POA: Diagnosis not present

## 2022-02-04 DIAGNOSIS — I10 Essential (primary) hypertension: Secondary | ICD-10-CM

## 2022-02-04 DIAGNOSIS — R400 Somnolence: Secondary | ICD-10-CM | POA: Diagnosis not present

## 2022-02-04 DIAGNOSIS — I25118 Atherosclerotic heart disease of native coronary artery with other forms of angina pectoris: Secondary | ICD-10-CM | POA: Diagnosis not present

## 2022-02-04 DIAGNOSIS — E1129 Type 2 diabetes mellitus with other diabetic kidney complication: Secondary | ICD-10-CM | POA: Diagnosis not present

## 2022-02-04 DIAGNOSIS — E785 Hyperlipidemia, unspecified: Secondary | ICD-10-CM | POA: Diagnosis not present

## 2022-02-04 DIAGNOSIS — E1169 Type 2 diabetes mellitus with other specified complication: Secondary | ICD-10-CM | POA: Diagnosis not present

## 2022-02-04 DIAGNOSIS — E1142 Type 2 diabetes mellitus with diabetic polyneuropathy: Secondary | ICD-10-CM | POA: Diagnosis not present

## 2022-02-04 DIAGNOSIS — E1159 Type 2 diabetes mellitus with other circulatory complications: Secondary | ICD-10-CM | POA: Diagnosis not present

## 2022-02-04 DIAGNOSIS — Z794 Long term (current) use of insulin: Secondary | ICD-10-CM | POA: Diagnosis not present

## 2022-02-04 DIAGNOSIS — R809 Proteinuria, unspecified: Secondary | ICD-10-CM | POA: Diagnosis not present

## 2022-02-04 NOTE — Progress Notes (Signed)
Cardiology Office Note ? ?Date:  02/04/2022  ? ?ID:  Joseph [name], DOB [birth year], MRN [id number] ? ?PCP:  [name]., MD  ? ?Chief Complaint  ?Patient presents with  ? Follow-up  ?  Patient's daughter Joseph [name]) has some concerns with the patient sleeping all day, left foot swelling and the patient had stopped the Plavix and Aspirin for 4 weeks due to nosebleeds but recently started back Plavix and Aspirin yesterday morning.  Medications reviewed by the patient verbally.   ? ? ?HPI:  ?Joseph [name] is a 88 year old gentleman with a hx of  ?CAD, CABG in 1994,  ?PAD , followed by Dr.  Lucky Cowboy,  ?obesity,  ?hyperlipidemia,  ?arthritis of the knees,  ? fatigue, SOB, leg weakness,   ?lower extremity edema, ? Carotid u/s  Less than 39% stenosis bilaterally ?presenting for routine followup of his coronary artery disease and  chronic diastolic CHF.   ? ?In follow-up today presents with his daughter who is visiting from out of town ?She is concerned about fatigue, sleeping more in the daytime ?Has been less interactive with family visits ?Does not get out much, does little bit of walking uses a scooter ?No regular exercise program, legs weak ? ?Echo 4/22 ?Ef 45 to 50% ? ?Reports that he stopped aspirin Plavix for 4 weeks secondary to nosebleeds, only recently restarted ? ?Denies chest pain concerning for angina ?Spends much of his time sedentary, sitting, TV ?Sometimes on the computer ?Has caretakers that help fix his food ? ?Lost wife beginning of the year 2022 ? ?Labs reviewed ?A1c 7.8 ? ?Total chol 115, LDL 57 ? ?EKG personally reviewed by myself on todays visit ?Nsr rate 58  LBBB ? ?-cath 12/17/2019  ?Severe native CAD, including 80% distal LMCA, 100% proximal LAD and diffuse LCx/OM disease.  90% mid RCA stenosis and CTO of rPDA also present. ?Patent LIMA->D3 with backfilling of the mid/distal LADl. ?Patent SVG-D1-D2, SVG-OM1-OM2, and SVG-rPDA-rPL. ?Mildly to moderately elevated LVEDP. ?At least moderately reduced  left ventricular systolic function. ?  ?Medical therapy; recommend up to 12 months of DAPT with ASA and clopidogrel, as tolerated. ?Aggressive secondary prevention of coronary artery disease. ?Obtain echocardiogram to better assess LVEF.Marland Kitchen ?Monitor on telemetry.  Consider EP consultation of AV block persists, particularly if the patient is symptoms. ? ? ?Echo 12/17/2019 ? 1. Left ventricular ejection fraction, by visual estimation, is 50 to  ?55%. The left ventricle has low normal function. There is mildly increased  ?left ventricular hypertrophy. ? ?Other past medical history reviewed ? headache 02/03/2017,  ?Seen in th ER for H/A 02/03/17 ?Confused,  ?In the emergency room had CT head, Showing atherosclerotic calcification of the cavernous carotid arteries bilaterally. ?Lab work within normal limits ?No other focal neurologic deficits ?Patient and family thinks he had a TIA ? ?admission May 2017 for acute cholecystitis, developing acute respiratory distress with hypoxia and acute renal disease, peak creatinine of 2. ?Notes indicating HIDA scan positive for acute cholecystitis. ?s/p cholecystostomy tube placed in through interventional radiology 03/24/16 ?Diuretic was discontinued in the setting of acute renal failure ?He does report greater than 20 pound weight loss ?  ?Echocardiogram 03/22/2016 reviewed with him, ejection fraction 50-55% ?Chest x-ray 03/29/2016 with small right pleural effusion ?  ?Ultrasound of the legs showing patent right SFA stent, left side CFA and popliteal artery with good flow, waveform deterioration around the ankle on the left ?  ?Last Cardiac catheterization was [facility] 08/16/2012 ?Catheterization showed severe three-vessel coronary  artery disease with patent grafts x4. There was 80% disease of a small to moderate sized distal left circumflex. Ejection fraction 45-50%. No significant aortic valve stenosis or mitral valve regurgitation. The findings were discussed with  interventional cardiology and medical management was recommended for the distal left circumflex disease. ?  ? previous Echocardiogram did not show elevated right-sided pressures and ejection fraction was normal. ? Previous episode of tachycardia in June 2013 that resolved without intervention ?  ?PMH:   has a past medical history of Acute respiratory failure with hypoxia ([place]) (03/21/2016), CAD (coronary artery disease), Chronic diastolic CHF (congestive heart failure) ([place]), Diabetes mellitus, type 2 ([place]), HTN (hypertension), Hyperlipidemia, Hypothyroidism, Lower extremity edema, and Spinal stenosis. ? ?PSH:    ?Past Surgical History:  ?Procedure Laterality Date  ? APPENDECTOMY    ? CARDIAC CATHETERIZATION  07/2012  ? [facility]/no stents  ? CHOLECYSTECTOMY    ? CORNEAL TRANSPLANT    ? CORONARY ARTERY BYPASS GRAFT  1994  ? HEMORRHOID SURGERY    ? LEFT HEART CATH AND CORS/GRAFTS ANGIOGRAPHY N/A 12/17/2019  ? Procedure: LEFT HEART CATH AND CORS/GRAFTS ANGIOGRAPHY;  Surgeon: Nelva Bush, MD;  Location: [facility];  Service: Cardiovascular;  Laterality: N/A;  ? PROSTATE BIOPSY    ? TOE SURGERY    ? ingrown toe nail  ? ?Current Outpatient Medications on File Prior to Visit  ?Medication Sig Dispense Refill  ? aspirin EC 81 MG tablet Take 1 tablet (81 mg total) by mouth daily. 90 tablet 3  ? atorvastatin (LIPITOR) 80 MG tablet Take 1 tablet (80 mg total) by mouth daily. 90 tablet 3  ? cetirizine (ZYRTEC) 10 MG tablet Take 1 tablet (10 mg total) by mouth daily. 30 tablet 11  ? Cholecalciferol 125 MCG (5000 UT) capsule Take 5,000 Units by mouth at bedtime.    ? cholestyramine (QUESTRAN) 4 g packet Take 1 packet (4 g total) by mouth 3 (three) times daily with meals. 60 each 12  ? clobetasol cream (TEMOVATE) 4.08 % Apply 1 application topically 2 (two) times daily. As needed to hands and elbows    ? clopidogrel (PLAVIX) 75 MG tablet Take 1 tablet (75 mg total) by mouth daily. 90 tablet 3  ? Continuous Blood Gluc Sensor  (FREESTYLE LIBRE 14 DAY SENSOR) MISC USE AS DIRECTED EVERY 14 DAYS 2 each 0  ? dextromethorphan-guaiFENesin (MUCINEX DM) 30-600 MG 12hr tablet Take 1 tablet by mouth 2 (two) times daily. 12 tablet 0  ? hydrocortisone 2.5 % cream APPLY TOPICALLY TWICE DAILY AS NEEDED TO FACE, CREASE, GROIN AND BUTT 28 g 0  ? insulin aspart (NOVOLOG) 100 UNIT/ML injection Inject into the skin 3 (three) times daily before meals. Inject 34 units in the morning / 24 units at 12:00pm / and 14 units at night time.    ? insulin glargine (LANTUS) 100 UNIT/ML injection Inject 50 Units into the skin daily.    ? ketoconazole (NIZORAL) 2 % cream APPLY TOPICALLY TWICE DAILY AS NEEDED FOR IRRITATION TO GROIN, FACE, AND BUTTOCKS 15 g 0  ? levothyroxine (SYNTHROID) 50 MCG tablet TAKE 1 TABLET BY MOUTH ONCE DAILY BEFORE BREAKFAST 90 tablet 0  ? losartan (COZAAR) 100 MG tablet Take 1 tablet (100 mg total) by mouth daily. 90 tablet 3  ? Magnesium 250 MG TABS Take 250 mg by mouth as needed (swelling). Take when you take your Torsemide.    ? mometasone (ELOCON) 0.1 % ointment Apply topically daily. As needed to scalp and ears    ?  nitroGLYCERIN (NITROSTAT) 0.4 MG SL tablet Place 1 tablet (0.4 mg total) under the tongue every 5 (five) minutes as needed for chest pain. 25 tablet 3  ? omeprazole (PRILOSEC) 20 MG capsule Take 1 capsule by mouth once daily 90 capsule 3  ? potassium chloride (KLOR-CON) 10 MEQ tablet Take 1 tablet (10 mEq total) by mouth every Monday, Wednesday, and Friday. 39 tablet 3  ? torsemide (DEMADEX) 20 MG tablet Take 1 tablet (20 mg total) by mouth every Monday, Wednesday, and Friday. 39 tablet 3  ? predniSONE (DELTASONE) 5 MG tablet Take 1 tablet (5 mg total) by mouth daily with breakfast. (Patient not taking: Reported on 02/04/2022) 5 tablet 0  ? ?No current facility-administered medications on file prior to visit.  ? ? ?Allergies:   Patient has no known allergies.  ? ?Social History:  The patient  reports that he quit smoking about  41 years ago. His smoking use included pipe, cigars, and cigarettes. He has never used smokeless tobacco. He reports that he does not drink alcohol and does not use drugs.  ? ?Family History:   family history incl

## 2022-02-04 NOTE — Patient Instructions (Addendum)
Medication Instructions:  ?- Your physician has recommended you make the following change in your medication:  ? ?1) STOP aspirin ? ?2) STAY ON plavix ? ?If you need a refill on your cardiac medications before your next appointment, please call your pharmacy.  ? ?Lab work: ?No new labs needed ? ?Testing/Procedures: ?No new testing needed ? ?Follow-Up: ?At [facility], you and your health needs are our priority.  As part of our continuing mission to provide you with exceptional heart care, we have created designated Provider Care Teams.  These Care Teams include your primary Cardiologist (physician) and Advanced Practice Providers (APPs -  Physician Assistants and Nurse Practitioners) who all work together to provide you with the care you need, when you need it. ? ?You will need a follow up appointment in 6 months ? ?Providers on your designated Care Team:   ?Murray Hodgkins, NP ?Christell Faith, PA-C ?Cadence Kathlen Mody, PA-C ? ?COVID-19 Vaccine Information can be found at: [email or web address] For questions related to vaccine distribution or appointments, please email [email or web address] or call [phone].  ? ?

## 2022-02-11 ENCOUNTER — Ambulatory Visit: Payer: HMO | Admitting: Cardiovascular Disease

## 2022-03-02 ENCOUNTER — Ambulatory Visit (INDEPENDENT_AMBULATORY_CARE_PROVIDER_SITE_OTHER): Payer: HMO

## 2022-03-02 VITALS — BP 142/80 | HR 58 | Temp 96.4°F | Ht 66.5 in | Wt 218.6 lb

## 2022-03-02 DIAGNOSIS — Z0000 Encounter for general adult medical examination without abnormal findings: Secondary | ICD-10-CM

## 2022-03-02 DIAGNOSIS — I10 Essential (primary) hypertension: Secondary | ICD-10-CM | POA: Insufficient documentation

## 2022-03-02 DIAGNOSIS — K219 Gastro-esophageal reflux disease without esophagitis: Secondary | ICD-10-CM | POA: Insufficient documentation

## 2022-03-02 DIAGNOSIS — E119 Type 2 diabetes mellitus without complications: Secondary | ICD-10-CM | POA: Insufficient documentation

## 2022-03-02 DIAGNOSIS — N400 Enlarged prostate without lower urinary tract symptoms: Secondary | ICD-10-CM | POA: Insufficient documentation

## 2022-03-02 DIAGNOSIS — E782 Mixed hyperlipidemia: Secondary | ICD-10-CM | POA: Insufficient documentation

## 2022-03-02 DIAGNOSIS — N200 Calculus of kidney: Secondary | ICD-10-CM | POA: Insufficient documentation

## 2022-03-02 DIAGNOSIS — E039 Hypothyroidism, unspecified: Secondary | ICD-10-CM | POA: Insufficient documentation

## 2022-03-02 DIAGNOSIS — Z7189 Other specified counseling: Secondary | ICD-10-CM | POA: Insufficient documentation

## 2022-03-02 NOTE — Progress Notes (Signed)
Subjective:   Joseph [name] is a 88 y.o. male who presents for Medicare Annual/Subsequent preventive examination.  Review of Systems           Objective:    Today's Vitals   03/02/22 1534  BP: (!) 142/80  Pulse: (!) 58  Temp: (!) 96.4 F (35.8 C)  TempSrc: Skin  SpO2: 95%  Weight: 218 lb 9.6 oz (99.2 kg)  Height: 5' 6.5" (1.689 m)   Body mass index is 34.75 kg/m.     01/25/2021    1:37 PM 02/13/2020   10:04 AM 12/16/2019    5:18 PM 12/16/2019    1:33 PM 01/29/2019    3:25 PM 01/08/2018    1:44 PM 08/08/2017    1:54 PM  Advanced Directives  Does Patient Have a Medical Advance Directive? Yes Yes Yes Yes Yes No Yes  Type of Paramedic of [place];Living will [place];Living will Living will Living will [place];Living will  Living will  Does patient want to make changes to medical advance directive?   No - Patient declined No - Patient declined  No - Patient declined   Copy of [place] in Chart? No - copy requested No - copy requested   No - copy requested      Current Medications (verified) Outpatient Encounter Medications as of 03/02/2022  Medication Sig   Continuous Blood Gluc Receiver (FREESTYLE LIBRE 2 READER) DEVI Use 1 Device as directed   insulin aspart (NOVOLOG) 100 UNIT/ML injection Take from 14 - 34 units before meals, as directed. Max daily dose is 90 units.   insulin glargine (LANTUS) 100 UNIT/ML injection Inject into the skin.   aspirin 81 MG chewable tablet Chew by mouth.   atorvastatin (LIPITOR) 80 MG tablet Take 1 tablet (80 mg total) by mouth daily.   cetirizine (ZYRTEC) 10 MG tablet Take 1 tablet (10 mg total) by mouth daily.   Cholecalciferol 125 MCG (5000 UT) capsule Take 5,000 Units by mouth at bedtime.   cholestyramine (QUESTRAN) 4 g packet Take 1 packet (4 g total) by mouth 3 (three) times daily with meals.   clobetasol cream (TEMOVATE) 4.09 % Apply 1 application  topically 2 (two) times daily. As needed to hands and elbows   clopidogrel (PLAVIX) 75 MG tablet Take 1 tablet (75 mg total) by mouth daily.   Continuous Blood Gluc Sensor (FREESTYLE LIBRE 14 DAY SENSOR) MISC USE AS DIRECTED EVERY 14 DAYS   dextromethorphan-guaiFENesin (MUCINEX DM) 30-600 MG 12hr tablet Take 1 tablet by mouth 2 (two) times daily.   hydrocortisone 2.5 % cream APPLY TOPICALLY TWICE DAILY AS NEEDED TO FACE, CREASE, GROIN AND BUTT   insulin aspart (NOVOLOG) 100 UNIT/ML injection Inject into the skin 3 (three) times daily before meals. Inject 34 units in the morning / 24 units at 12:00pm / and 14 units at night time.   insulin glargine (LANTUS) 100 UNIT/ML injection Inject 50 Units into the skin daily.   ketoconazole (NIZORAL) 2 % cream APPLY TOPICALLY TWICE DAILY AS NEEDED FOR IRRITATION TO GROIN, FACE, AND BUTTOCKS   levothyroxine (SYNTHROID) 50 MCG tablet TAKE 1 TABLET BY MOUTH ONCE DAILY BEFORE BREAKFAST   losartan (COZAAR) 100 MG tablet Take 1 tablet (100 mg total) by mouth daily.   Magnesium 250 MG TABS Take 250 mg by mouth as needed (swelling). Take when you take your Torsemide.   mometasone (ELOCON) 0.1 % ointment Apply topically daily. As needed  to scalp and ears   nitroGLYCERIN (NITROSTAT) 0.4 MG SL tablet Place 1 tablet (0.4 mg total) under the tongue every 5 (five) minutes as needed for chest pain.   omeprazole (PRILOSEC) 20 MG capsule Take 1 capsule by mouth once daily   potassium chloride (KLOR-CON) 10 MEQ tablet Take 1 tablet (10 mEq total) by mouth every Monday, Wednesday, and Friday.   predniSONE (DELTASONE) 5 MG tablet Take 1 tablet (5 mg total) by mouth daily with breakfast. (Patient not taking: Reported on 02/04/2022)   torsemide (DEMADEX) 20 MG tablet Take 1 tablet (20 mg total) by mouth every Monday, Wednesday, and Friday.   No facility-administered encounter medications on file as of 03/02/2022.    Allergies (verified) Patient has no known allergies.    History: Past Medical History:  Diagnosis Date   Acute respiratory failure with hypoxia ([place]) 03/21/2016   CAD (coronary artery disease)    a. s/p CABG;  b. 07/2012 Cath: 3VD including 80 dLCX (med Rx), 4/4 patent grafts.   Chronic diastolic CHF (congestive heart failure) ([place])    a. 07/2012 Echo: EF 55-60%, no rwma, Gr 1 DD, mild MR;  04/2014 Echo: EF 55-60%, no rwma, mild MR, mildly dil LA.   Diabetes mellitus, type 2 ([place])    HTN (hypertension)    Hyperlipidemia    Hypothyroidism    Lower extremity edema    a. 03/2014 amlodipine d/c'd.   Spinal stenosis    Past Surgical History:  Procedure Laterality Date   APPENDECTOMY     CARDIAC CATHETERIZATION  07/2012   [facility]/no stents   CHOLECYSTECTOMY     CORNEAL TRANSPLANT     CORONARY ARTERY BYPASS GRAFT  1994   HEMORRHOID SURGERY     LEFT HEART CATH AND CORS/GRAFTS ANGIOGRAPHY N/A 12/17/2019   Procedure: LEFT HEART CATH AND CORS/GRAFTS ANGIOGRAPHY;  Surgeon: Nelva Bush, MD;  Location: [facility];  Service: Cardiovascular;  Laterality: N/A;   PROSTATE BIOPSY     TOE SURGERY     ingrown toe nail   Family History  Problem Relation Age of Onset   Heart failure Mother    Heart attack Father    Stroke Brother    Heart failure Maternal Uncle    Heart failure Maternal Grandfather    Sleep apnea Daughter    Sleep apnea Son    Kidney Stones Son    Social History   Socioeconomic History   Marital status: Widowed    Spouse name: Not on file   Number of children: 3   Years of education: Not on file   Highest education level: Some college, no degree  Occupational History   Occupation: retired  Tobacco Use   Smoking status: Former    Packs/day: 0.00    Years: 6.00    Pack years: 0.00    Types: Pipe, Cigars, Cigarettes    Quit date: 12/23/1980    Years since quitting: 41.2   Smokeless tobacco: Never  Vaping Use   Vaping Use: Never used  Substance and Sexual Activity   Alcohol use: No   Drug use: No   Sexual  activity: Never  Other Topics Concern   Not on file  Social History Narrative   Retired.    Social Determinants of Health   Financial Resource Strain: Not on file  Food Insecurity: Not on file  Transportation Needs: Not on file  Physical Activity: Not on file  Stress: Not on file  Social Connections: Not on file  Tobacco Counseling Counseling given: Not Answered   Clinical Intake:  Pre-visit preparation completed: Yes  Pain : No/denies pain     Nutritional Risks: None Diabetes: Yes CBG done?: No CBG resulted in Enter/ Edit results?: No Did pt. bring in CBG monitor from home?: No  How often do you need to have someone help you when you read instructions, pamphlets, or other written materials from your doctor or pharmacy?: 1 - Never  Diabetic?yes Nutrition Risk Assessment:  Has the patient had any N/V/D within the last 2 months?  Yes  Does the patient have any non-healing wounds?  No  Has the patient had any unintentional weight loss or weight gain?  No   Diabetes:  Is the patient diabetic?  Yes  If diabetic, was a CBG obtained today?  No  Did the patient bring in their glucometer from home?  No  How often do you monitor your CBG's? 4 times per day.   Financial Strains and Diabetes Management:  Are you having any financial strains with the device, your supplies or your medication? No .  Does the patient want to be seen by Chronic Care Management for management of their diabetes?  No  Would the patient like to be referred to a Nutritionist or for Diabetic Management?  No   Diabetic Exams:  Diabetic Eye Exam: Completed 11/18/19. Overdue for diabetic eye exam. Pt has been advised about the importance in completing this exam.   Diabetic Foot Exam: Completed 09/22/20. Pt has been advised about the importance in completing this exam.   Interpreter Needed?: No  Information entered by :: Kirke Shaggy, LPN   Activities of Daily Living    01/05/2022    2:12  PM  In your present state of health, do you have any difficulty performing the following activities:  Hearing? 1  Vision? 0  Difficulty concentrating or making decisions? 0  Walking or climbing stairs? 0  Dressing or bathing? 0  Doing errands, shopping? 0    Patient Care Team: [name]., MD as PCP - General (Unknown Physician Specialty) Minna Merritts, MD as PCP - Cardiology (Cardiology) Gabriel Carina Betsey Holiday, MD as Physician Assistant (Endocrinology) Marsh Dolly, MD as Referring Physician (Internal Medicine)  Indicate any recent Medical Services you may have received from other than [facility] providers in the past year (date may be approximate).     Assessment:   This is a routine wellness examination for Joseph [name].  Hearing/Vision screen No results found.  Dietary issues and exercise activities discussed:     Goals Addressed   None    Depression Screen    01/05/2022    2:12 PM 01/25/2021    1:32 PM 05/21/2020   11:30 AM 02/13/2020   10:00 AM 01/29/2019    3:31 PM 01/29/2019    3:27 PM 01/08/2018    1:45 PM  PHQ 2/9 Scores  PHQ - 2 Score 0 1 0 0 0 0 0  PHQ- 9 Score 3    0      Fall Risk    01/05/2022    2:12 PM 01/25/2021    1:39 PM 05/05/2020    1:47 PM 02/13/2020   10:04 AM 01/29/2019    3:27 PM  [place] in the past year? 0 0 0 0 0  Number falls in past yr:  0  0   Injury with Fall?  0  0   Risk for fall due to :  Medication side effect    Follow up   Falls prevention discussed      FALL RISK PREVENTION PERTAINING TO THE HOME:  Any stairs in or around the home? No  If so, are there any without handrails? No  Home free of loose throw rugs in walkways, pet beds, electrical cords, etc? Yes  Adequate lighting in your home to reduce risk of falls? Yes   ASSISTIVE DEVICES UTILIZED TO PREVENT FALLS:  Life alert? Yes  Use of a cane, walker or w/c? Yes  Grab bars in the bathroom? Yes  Shower chair or bench in shower? Yes  Elevated toilet seat or a  handicapped toilet? Yes   TIMED UP AND GO:  Was the test performed? Yes .  Length of time to ambulate 10 feet: 6 sec.   Gait slow and steady without use of assistive device  Cognitive Function:        01/29/2019    3:31 PM 01/08/2018    1:48 PM 12/23/2016   11:33 AM  6CIT Screen  What Year? 0 points 0 points 0 points  What month? 0 points 0 points 0 points  What time? 0 points 0 points 0 points  Count back from 20 0 points 0 points 0 points  Months in reverse 0 points 2 points 0 points  Repeat phrase 0 points 0 points 2 points  Total Score 0 points 2 points 2 points    Immunizations Immunization History  Administered Date(s) Administered   Fluad Quad(high Dose 65+) 11/07/2019, 09/30/2021   Influenza Split 08/23/2011   Influenza, High Dose Seasonal PF 09/01/2015, 08/08/2017   Influenza,inj,Quad PF,6+ Mos 08/05/2013, 08/04/2014, 08/18/2016   PFIZER(Purple Top)SARS-COV-2 Vaccination 02/24/2020, 03/18/2020   Pneumococcal Conjugate-13 03/24/2015   Pneumococcal Polysaccharide-23 10/18/2014   Tdap 08/04/2014   Zoster, Live 08/04/2014    TDAP status: Up to date  Flu Vaccine status: Up to date  Pneumococcal vaccine status: Up to date  Covid-19 vaccine status: Completed vaccines  Qualifies for Shingles Vaccine? Yes   Zostavax completed Yes   Shingrix Completed?: No.    Education has been provided regarding the importance of this vaccine. Patient has been advised to call insurance company to determine out of pocket expense if they have not yet received this vaccine. Advised may also receive vaccine at local pharmacy or Health Dept. Verbalized acceptance and understanding.  Screening Tests Health Maintenance  Topic Date Due   Zoster Vaccines- Shingrix (1 of 2) Never done   COVID-19 Vaccine (3 - Pfizer risk series) 04/15/2020   OPHTHALMOLOGY EXAM  11/17/2020   FOOT EXAM  09/22/2021   HEMOGLOBIN A1C  02/08/2022   INFLUENZA VACCINE  06/21/2022   TETANUS/TDAP  08/04/2024    Pneumonia Vaccine 22+ Years old  Completed   HPV VACCINES  Aged Out    Health Maintenance  Health Maintenance Due  Topic Date Due   Zoster Vaccines- Shingrix (1 of 2) Never done   COVID-19 Vaccine (3 - Pfizer risk series) 04/15/2020   OPHTHALMOLOGY EXAM  11/17/2020   FOOT EXAM  09/22/2021   HEMOGLOBIN A1C  02/08/2022    Colorectal cancer screening: No longer required.   Lung Cancer Screening: (Low Dose CT Chest recommended if Age 20-80 years, 30 pack-year currently smoking OR have quit w/in 15years.) does not qualify.   Additional Screening:  Hepatitis C Screening: does not qualify; Completed no  Vision Screening: Recommended annual ophthalmology exams for early detection of glaucoma and other disorders of the eye. Is the  patient up to date with their annual eye exam?  Yes  Who is the provider or what is the name of the office in which the patient attends annual eye exams? [facility] If pt is not established with a provider, would they like to be referred to a provider to establish care? No .   Dental Screening: Recommended annual dental exams for proper oral hygiene  Community Resource Referral / Chronic Care Management: CRR required this visit?  No   CCM required this visit?  No      Plan:     I have personally reviewed and noted the following in the patient's chart:   Medical and social history Use of alcohol, tobacco or illicit drugs  Current medications and supplements including opioid prescriptions. Patient is not currently taking opioid prescriptions. Functional ability and status Nutritional status Physical activity Advanced directives List of other physicians Hospitalizations, surgeries, and ER visits in previous 12 months Vitals Screenings to include cognitive, depression, and falls Referrals and appointments  In addition, I have reviewed and discussed with patient certain preventive protocols, quality metrics, and best practice recommendations. A  written personalized care plan for preventive services as well as general preventive health recommendations were provided to patient.     Dionisio David, LPN   07/19/5620   Nurse Notes: none

## 2022-03-02 NOTE — Patient Instructions (Signed)
Joseph [name] , ?Thank you for taking time to come for your Medicare Wellness Visit. I appreciate your ongoing commitment to your health goals. Please review the following plan we discussed and let me know if I can assist you in the future.  ? ?Screening recommendations/referrals: ?Colonoscopy: aged out ?Recommended yearly ophthalmology/optometry visit for glaucoma screening and checkup ?Recommended yearly dental visit for hygiene and checkup ? ?Vaccinations: ?Influenza vaccine: 09/30/21 ?Pneumococcal vaccine: 03/24/15 ?Tdap vaccine: 08/04/14 ?Shingles vaccine: Zostavax 08/04/14   ?Covid-19: 02/24/20, 03/18/20 ? ?Advanced directives: yes ? ?Conditions/risks identified: none ? ?Next appointment: Follow up in one year for your annual wellness visit. 03/07/23 @ 3:15 in person ? ? ?Preventive Care 34 Years and Older, Male ?Preventive care refers to lifestyle choices and visits with your health care provider that can promote health and wellness. ?What does preventive care include? ?A yearly physical exam. This is also called an annual well check. ?Dental exams once or twice a year. ?Routine eye exams. Ask your health care provider how often you should have your eyes checked. ?Personal lifestyle choices, including: ?Daily care of your teeth and gums. ?Regular physical activity. ?Eating a healthy diet. ?Avoiding tobacco and drug use. ?Limiting alcohol use. ?Practicing safe sex. ?Taking low doses of aspirin every day. ?Taking vitamin and mineral supplements as recommended by your health care provider. ?What happens during an annual well check? ?The services and screenings done by your health care provider during your annual well check will depend on your age, overall health, lifestyle risk factors, and family history of disease. ?Counseling  ?Your health care provider may ask you questions about your: ?Alcohol use. ?Tobacco use. ?Drug use. ?Emotional well-being. ?Home and relationship well-being. ?Sexual activity. ?Eating  habits. ?History of falls. ?Memory and ability to understand (cognition). ?Work and work Statistician. ?Screening  ?You may have the following tests or measurements: ?Height, weight, and BMI. ?Blood pressure. ?Lipid and cholesterol levels. These may be checked every 5 years, or more frequently if you are over 86 years old. ?Skin check. ?Lung cancer screening. You may have this screening every year starting at age 17 if you have a 30-pack-year history of smoking and currently smoke or have quit within the past 15 years. ?Fecal occult blood test (FOBT) of the stool. You may have this test every year starting at age 28. ?Flexible sigmoidoscopy or colonoscopy. You may have a sigmoidoscopy every 5 years or a colonoscopy every 10 years starting at age 45. ?Prostate cancer screening. Recommendations will vary depending on your family history and other risks. ?Hepatitis C blood test. ?Hepatitis B blood test. ?Sexually transmitted disease (STD) testing. ?Diabetes screening. This is done by checking your blood sugar (glucose) after you have not eaten for a while (fasting). You may have this done every 1-3 years. ?Abdominal aortic aneurysm (AAA) screening. You may need this if you are a current or former smoker. ?Osteoporosis. You may be screened starting at age 5 if you are at high risk. ?Talk with your health care provider about your test results, treatment options, and if necessary, the need for more tests. ?Vaccines  ?Your health care provider may recommend certain vaccines, such as: ?Influenza vaccine. This is recommended every year. ?Tetanus, diphtheria, and acellular pertussis (Tdap, Td) vaccine. You may need a Td booster every 10 years. ?Zoster vaccine. You may need this after age 44. ?Pneumococcal 13-valent conjugate (PCV13) vaccine. One dose is recommended after age 55. ?Pneumococcal polysaccharide (PPSV23) vaccine. One dose is recommended after age 81. ?Talk to your health  care provider about which screenings and  vaccines you need and how often you need them. ?This information is not intended to replace advice given to you by your health care provider. Make sure you discuss any questions you have with your health care provider. ?Document Released: 12/04/2015 Document Revised: 07/27/2016 Document Reviewed: 09/08/2015 ?Elsevier Interactive Patient Education ? 2017 [place]. ? ?Fall Prevention in the Home ?Falls can cause injuries. They can happen to people of all ages. There are many things you can do to make your home safe and to help prevent falls. ?What can I do on the outside of my home? ?Regularly fix the edges of walkways and driveways and fix any cracks. ?Remove anything that might make you trip as you walk through a door, such as a raised step or threshold. ?Trim any bushes or trees on the path to your home. ?Use bright outdoor lighting. ?Clear any walking paths of anything that might make someone trip, such as rocks or tools. ?Regularly check to see if handrails are loose or broken. Make sure that both sides of any steps have handrails. ?Any raised decks and porches should have guardrails on the edges. ?Have any leaves, snow, or ice cleared regularly. ?Use sand or salt on walking paths during winter. ?Clean up any spills in your garage right away. This includes oil or grease spills. ?What can I do in the bathroom? ?Use night lights. ?Install grab bars by the toilet and in the tub and shower. Do not use towel bars as grab bars. ?Use non-skid mats or decals in the tub or shower. ?If you need to sit down in the shower, use a plastic, non-slip stool. ?Keep the floor dry. Clean up any water that spills on the floor as soon as it happens. ?Remove soap buildup in the tub or shower regularly. ?Attach bath mats securely with double-sided non-slip rug tape. ?Do not have throw rugs and other things on the floor that can make you trip. ?What can I do in the bedroom? ?Use night lights. ?Make sure that you have a light by your  bed that is easy to reach. ?Do not use any sheets or blankets that are too big for your bed. They should not hang down onto the floor. ?Have a firm chair that has side arms. You can use this for support while you get dressed. ?Do not have throw rugs and other things on the floor that can make you trip. ?What can I do in the kitchen? ?Clean up any spills right away. ?Avoid walking on wet floors. ?Keep items that you use a lot in easy-to-reach places. ?If you need to reach something above you, use a strong step stool that has a grab bar. ?Keep electrical cords out of the way. ?Do not use floor polish or wax that makes floors slippery. If you must use wax, use non-skid floor wax. ?Do not have throw rugs and other things on the floor that can make you trip. ?What can I do with my stairs? ?Do not leave any items on the stairs. ?Make sure that there are handrails on both sides of the stairs and use them. Fix handrails that are broken or loose. Make sure that handrails are as long as the stairways. ?Check any carpeting to make sure that it is firmly attached to the stairs. Fix any carpet that is loose or worn. ?Avoid having throw rugs at the top or bottom of the stairs. If you do have throw rugs, attach them to  the floor with carpet tape. ?Make sure that you have a light switch at the top of the stairs and the bottom of the stairs. If you do not have them, ask someone to add them for you. ?What else can I do to help prevent falls? ?Wear shoes that: ?Do not have high heels. ?Have rubber bottoms. ?Are comfortable and fit you well. ?Are closed at the toe. Do not wear sandals. ?If you use a stepladder: ?Make sure that it is fully opened. Do not climb a closed stepladder. ?Make sure that both sides of the stepladder are locked into place. ?Ask someone to hold it for you, if possible. ?Clearly mark and make sure that you can see: ?Any grab bars or handrails. ?First and last steps. ?Where the edge of each step is. ?Use tools that  help you move around (mobility aids) if they are needed. These include: ?Canes. ?Walkers. ?Scooters. ?Crutches. ?Turn on the lights when you go into a dark area. Replace any light bulbs as soon as they burn out.

## 2022-03-26 ENCOUNTER — Other Ambulatory Visit: Payer: Self-pay | Admitting: Family Medicine

## 2022-03-26 DIAGNOSIS — L409 Psoriasis, unspecified: Secondary | ICD-10-CM

## 2022-03-28 NOTE — Telephone Encounter (Signed)
Requested medication (s) are due for refill today: yes ? ?Requested medication (s) are on the active medication list: yes ? ?Last refill:  03/23/22 ? ?Future visit scheduled: yes ? ?Notes to clinic:  Unable to refill per protocol, medication not assigned to the refill protocol. ? ? ? ?  ?Requested Prescriptions  ?Pending Prescriptions Disp Refills  ? hydrocortisone 2.5 % cream [Pharmacy Med Name: Hydrocortisone 2.5 % External Cream] 28 g 0  ?  Sig: APPLY TOPICALLY TWICE DAILY AS NEEDED TO FACE, CREASE, GROIN, BUTT.  ?  ? Off-Protocol Failed - 03/26/2022  4:07 PM  ?  ?  Failed - Medication not assigned to a protocol, review manually.  ?  ?  Passed - Valid encounter within last 12 months  ?  Recent Outpatient Visits   ? ?      ? 2 months ago Atherosclerosis of coronary artery bypass graft of native heart with stable angina pectoris ([place])  ? [facility] [name]., MD  ? 2 months ago Acute non-recurrent frontal sinusitis  ? [facility] Gwyneth Sprout, FNP  ? 5 months ago Generalized abdominal pain  ? [facility] [name]., MD  ? 6 months ago Essential hypertension  ? [facility] [name]., MD  ? 7 months ago Generalized abdominal pain  ? [facility] [name]., MD  ? ?  ?  ?Future Appointments   ? ?        ? In 2 months [name]., MD [facility], [facility]  ? In 4 months Gollan, Kathlene November, MD [facility], [facility]  ? ?  ? ? ?  ?  ?  ? ?

## 2022-04-12 DIAGNOSIS — I509 Heart failure, unspecified: Secondary | ICD-10-CM | POA: Diagnosis not present

## 2022-04-12 DIAGNOSIS — E1151 Type 2 diabetes mellitus with diabetic peripheral angiopathy without gangrene: Secondary | ICD-10-CM | POA: Diagnosis not present

## 2022-04-12 DIAGNOSIS — E1165 Type 2 diabetes mellitus with hyperglycemia: Secondary | ICD-10-CM | POA: Diagnosis not present

## 2022-04-12 DIAGNOSIS — I70203 Unspecified atherosclerosis of native arteries of extremities, bilateral legs: Secondary | ICD-10-CM | POA: Diagnosis not present

## 2022-04-18 ENCOUNTER — Telehealth: Payer: Self-pay | Admitting: Physician Assistant

## 2022-04-18 DIAGNOSIS — I498 Other specified cardiac arrhythmias: Secondary | ICD-10-CM | POA: Diagnosis not present

## 2022-04-18 DIAGNOSIS — Z5321 Procedure and treatment not carried out due to patient leaving prior to being seen by health care provider: Secondary | ICD-10-CM | POA: Diagnosis not present

## 2022-04-18 DIAGNOSIS — R040 Epistaxis: Secondary | ICD-10-CM | POA: Diagnosis not present

## 2022-04-18 DIAGNOSIS — R600 Localized edema: Secondary | ICD-10-CM | POA: Diagnosis not present

## 2022-04-18 DIAGNOSIS — R059 Cough, unspecified: Secondary | ICD-10-CM | POA: Diagnosis not present

## 2022-04-18 DIAGNOSIS — R0600 Dyspnea, unspecified: Secondary | ICD-10-CM | POA: Diagnosis not present

## 2022-04-18 DIAGNOSIS — R918 Other nonspecific abnormal finding of lung field: Secondary | ICD-10-CM | POA: Diagnosis not present

## 2022-04-18 DIAGNOSIS — R0602 Shortness of breath: Secondary | ICD-10-CM | POA: Diagnosis not present

## 2022-04-18 NOTE — Telephone Encounter (Signed)
Paged by answering service.  Patient with 2 to 3 days history of progressive worsening shortness of breath, cough, congestion and cold chest.  Hard of hearing.  I have recommended to call EMS for further evaluation or go to ER/urgent care.  He is agree with plan.

## 2022-04-19 ENCOUNTER — Telehealth: Payer: Self-pay | Admitting: Cardiovascular Disease

## 2022-04-19 ENCOUNTER — Ambulatory Visit: Payer: HMO | Admitting: Medical

## 2022-04-19 ENCOUNTER — Encounter: Payer: Self-pay | Admitting: Medical

## 2022-04-19 VITALS — BP 150/60 | HR 98 | Ht 67.0 in | Wt 215.4 lb

## 2022-04-19 DIAGNOSIS — I2510 Atherosclerotic heart disease of native coronary artery without angina pectoris: Secondary | ICD-10-CM

## 2022-04-19 DIAGNOSIS — I5022 Chronic systolic (congestive) heart failure: Secondary | ICD-10-CM

## 2022-04-19 DIAGNOSIS — R0609 Other forms of dyspnea: Secondary | ICD-10-CM

## 2022-04-19 DIAGNOSIS — I10 Essential (primary) hypertension: Secondary | ICD-10-CM

## 2022-04-19 DIAGNOSIS — I25118 Atherosclerotic heart disease of native coronary artery with other forms of angina pectoris: Secondary | ICD-10-CM | POA: Diagnosis not present

## 2022-04-19 DIAGNOSIS — I5032 Chronic diastolic (congestive) heart failure: Secondary | ICD-10-CM

## 2022-04-19 MED ORDER — TORSEMIDE 20 MG PO TABS: 20.0000 mg | ORAL_TABLET | Freq: Every day | ORAL | 3 refills | Status: DC

## 2022-04-19 NOTE — Patient Instructions (Signed)
Medication Instructions:  Your physician has recommended you make the following change in your medication:   TAKE Torsemide 40 mg today THEN go to Torsemide 20 mg once daily  *If you need a refill on your cardiac medications before your next appointment, please call your pharmacy*   Lab Work: None  If you have labs (blood work) drawn today and your tests are completely normal, you will receive your results only by: [place] (if you have MyChart) OR A paper copy in the mail If you have any lab test that is abnormal or we need to change your treatment, we will call you to review the results.   Testing/Procedures: Your physician has requested that you have an echocardiogram. Echocardiography is a painless test that uses sound waves to create images of your heart. It provides your doctor with information about the size and shape of your heart and how well your heart's chambers and valves are working. This procedure takes approximately one hour. There are no restrictions for this procedure.    Follow-Up: At [facility], you and your health needs are our priority.  As part of our continuing mission to provide you with exceptional heart care, we have created designated Provider Care Teams.  These Care Teams include your primary Cardiologist (physician) and Advanced Practice Providers (APPs -  Physician Assistants and Nurse Practitioners) who all work together to provide you with the care you need, when you need it.   Your next appointment:   2-3 week(s)  The format for your next appointment:   In Person  Provider:   Ida Rogue, MD or Cadence Kathlen Mody, [place]     Important Information About Sugar

## 2022-04-19 NOTE — Telephone Encounter (Signed)
   Pt c/o swelling: STAT is pt has developed SOB within 24 hours  If swelling, where is the swelling located? All over, but particularly his left leg and foot   How much weight have you gained and in what time span?   Have you gained 3 pounds in a day or 5 pounds in a week?   Do you have a log of your daily weights (if so, list)?   Are you currently taking a fluid pill? Patient was not taking because he said it makes him go to the bathroom too much  Are you currently SOB? Daughter is not with the patient now but said the patient had trouble breathing for four days   Have you traveled recently?no   Patient had a home health nurse come to see him first yesterday and had a theory that he may have fluid around his heart which is causing him to be SOB. Patient spent the better part of the day yesterday at the hospital in [place] but did not end up seeing a MD. The patient's Son took him to the hospital and he had some tests done. Daughter does not live local but wants him worked in to be seen

## 2022-04-19 NOTE — Telephone Encounter (Signed)
Pt c/o medication issue:  1. Name of Medication:   torsemide (DEMADEX) 20 MG tablet    2. How are you currently taking this medication (dosage and times per day)? Take 1 tablet (20 mg total) by mouth daily.  3. Are you having a reaction (difficulty breathing--STAT)? No  4. What is your medication issue?  Pt's daughter stated that it has just been bought to her attention that pt has not been taking medication. She states that pt has swelling and has been SOB. Pt was seen today by PA and daughter was not too happy with what was said. Daughter would like for pt to be seen again later in the week by DOD if possible. Please advise   Sent to [place] per Shanda Howells

## 2022-04-19 NOTE — Telephone Encounter (Signed)
Updated Cadence Furth, PA that the patient has not been taking his torsemide. Per Cadence medication recommendation made at todays o/v still applies. Pt to take 40 mg of torsemide today and then take 20 mg daily going fwd.  Returned the call to the patients daughter Jocelyn Lamer. Jocelyn Lamer lives in [place] she is currently driving to [place] to be with her Dad. Robby Sermon that the family were unhappy with the outcome of todays visit. They feel that all of the patients cardiac symptoms were not addressed. They are requesting the patient be seen this week by a DOD since Dr. Rockey Situ is out of the office. The scheduler discussed with Izora Gala scheduling the pt with DOD Dr. Saunders Revel tomorrow prior to the msg being fwd to this RN. Ok to schedule per Geannie Risen, Practice Adm.  Benjamine Sprague that the patient should follow the medication recommendation. DOD appt scheduled with Dr. Saunders Revel on 04/20/22 @ 10:40 am. Jocelyn Lamer verbalized understanding and voiced appreciation for the assistance.  Updated Dr. Darnelle Bos nurse Jinny Blossom, RN.

## 2022-04-19 NOTE — Telephone Encounter (Signed)
Spoke with patient's daughter.   Daughter reports that she was just recently up here from [place] visiting and she thought her father just had a cold. She has since gone back home and her father has still been having cough and SOB.   A HH RN went out to visit the patient yesterday and was concerned that there may be some fluid around his heart that was causing the SOB and cough.   Daughter called patient's son who is local and asked for him to check on patient. Son decided to take patient to [facility], where they did a CXR and drew labs, however patient's son had to leave and take patient home prior to patient being seen.   Daughter asking for patient to be seen sooner than patient's previously scheduled September appointment.   Daughter offered appointment with Cadence Kathlen Mody, PA today at 10:30. Daughter accepted and was grateful for the assistance.

## 2022-04-19 NOTE — Progress Notes (Unsigned)
Cardiology Office Note:    Date:  04/20/2022   ID:  Joseph [name], DOB 03/01/1925, MRN [id number]  PCP:  [name]., MD  [facility] Cardiologist:  Ida Rogue, MD  [place] Electrophysiologist:  None   Referring MD: [name].,*   Chief Complaint: ER follow-up  History of Present Illness:    TAHJAY [name] is a 88 y.o. male with a hx of CAD s/p four-vessel CABG in 1994, 11/2019 non-STEMI with subsequent LHC 12/17/2019 as outlined below, chronic combined CHF (EF 45-50%),PAD with reported right SFA stenting, bilateral carotid dz with imaging in 2018 showing 1-39% stenosis, Mobitz type I AVB, SVT, NSVT, hyperlipidemia, poorly controlled DM2, hypothyroidism, history of tobacco use via pipe smoking for 15 to 20 years (quit 1982), arthritis of the knees, history of fatigue, shortness of breath, generalized weakness (felt to be his anginal equivalent), and here today for follow-up.   He has a significant history of CAD, including remote history of CABG x4 in 1994.  He underwent LHC 08/16/2012 in the setting of progressive shortness of breath and fatigue, felt to possibly be his anginal equivalent.  This LHC showed severe three-vessel disease with patent grafts x4.  There is 80% disease of a small to moderate size distal left circumflex.  EF 45 to 50%.  Findings were discussed with interventional cardiology and medical management recommended for the distal left circumflex disease.  06/2012 echo showed EF 55 to 60%, mild MR, no regional wall motion abnormalities, and G1DD.  He was seen 04/2014 with progressive DOE and HCTZ changed to Lasix. He diuresed 12 pounds but still felt SOB after diuresis.  Notes indicated suspicion that SOB may be his anginal equivalent.  Echo 04/2014 showed EF 55 to 60% and mild MR, and mild LAE.  Per EMR, cardiac catheterization was recommended; however, this LHC was declined / deferred by the patient.  Imdur was added.   He was seen  03/17/2016 with decline over the previous several months, including inability to walk greater than 100 feet without having to stop and take a break to rest.  He was unable to take the trash out or climbing stairs.  His weight was stable at 230 pounds with 3 pillow orthopnea in the setting of GERD.  Cardiac catheterization was recommended and scheduled 03/31/2016; however, this was subsequently cancelled after he was admitted before his LHC to [facility] with AKI and hypoxia, as well as cholecystitis.  He underwent cholecystectomy.  He was seen at follow-up 05/16/2016 and was reportedly doing well with no indication for rescheduling LHC noted by his primary cardiologist due to lack of angina.  He was admitted 11/2019 after report of chest pain in the office that had started 4 days earlier.  Chest pain had started at rest during the evening and was associated with some belching, which relieved the discomfort.  He had reportedly had a large dinner that night and also was often constipated. However, the CP still continued and recommendation was for admission given Tn of 588 in office. EKG showed NSR with PVCs in a bigeminal pattern.  Subsequent EKG showed NSR, Mobitz type I, AVB, LAD, LBBB.  Telemetry was significant for Mobitz type I second-degree AV block.  Plan on admission was for cardiac catheterization 2/2 non-STEMI.     LHC 12/17/2019 was performed and showed severe CAD.  Cath showed 80% dLMCA, 100% pLAD, and diffuse LCx/OM disease.  90% MRCA stenosis and CTO of the RPDA was also present.  He had a patent LIMA-D3 with backfilling of the mid/distal LAD. Also noted was patent SVG-D1-D2, SVG to OM 1-OM 2, and SVG-RPDA-RPL.  Mild to moderately elevated LVEDP and moderately reduced LVSF was also noted.  Subsequent echo showed EF 50 to 55%/low normal function with mild LVH, moderate hypokinesis of the left ventricle and mid anterior septal wall, G1DD, LV RWMA, mildly reduced RVSF, mild LAE, mild MR, trivial TR, mild to  moderate AV sclerosis/calcification without evidence of aortic stenosis, and aortic dilation/dilation of the aortic root and ascending aorta measuring 38 mm.  Recommendations were for medical therapy with DAPT (ASA and clopidogrel) for at least 12 months.     Given his telemetry and EKG findings, EP consultation of AV block was recommended, especially if ongoing symptoms.  He subsequently underwent cardiac monitoring as below with NSR (average heart rate 62 bpm), 4 runs of VT (fastest 4 beats & max rate 222 bpm; longest 15 beats and average rate 120 bpm), 3 runs of SVT (fastest 5 beats, max rate 135 bpm; longest 6 beats, average rate 100 bpm), second-degree AV block Mobitz type I, isolated PACs, atrial couplets/triplets, occasional PVCs, rare ventricular couplets and triplets, and ventricular bigeminy and trigeminy.  Seen in April 2022 for dizziness. Torsemide was decreased. Repeat echo showed LVEF 45-50%, G1DD.   Last seen 02/04/22 for left foot swelling, and daytime sleepiness. Daughter reported decline since passing of his wife. It was felt breathing issues were due to fluid, deconditioning and obesity. He was not taking his Torsemide.   Recent ER visit 5/29 for chest pain and shortness of breath. BNP 308. HS trop 22. BNP wnl. CXR unremarkable. He left because he needed food for his diabetes.   Today, the patient reports he has shortness of breath and orthopnea. He takes torsemide '20mg'$  three times weekly. He says he has trouble breathing at night mostly. Unable to walk long distances. He does have some lower leg edema on exam. He received albuterol yesterday, but has not tried it. He denies chest pain.    Past Medical History:  Diagnosis Date   Acute respiratory failure with hypoxia ([place]) 03/21/2016   CAD (coronary artery disease)    a. s/p CABG;  b. 07/2012 Cath: 3VD including 80 dLCX (med Rx), 4/4 patent grafts.   Chronic diastolic CHF (congestive heart failure) ([place])    a. 07/2012 Echo: EF 55-60%,  no rwma, Gr 1 DD, mild MR;  04/2014 Echo: EF 55-60%, no rwma, mild MR, mildly dil LA.   Diabetes mellitus, type 2 ([place])    HTN (hypertension)    Hyperlipidemia    Hypothyroidism    Lower extremity edema    a. 03/2014 amlodipine d/c'd.   Spinal stenosis     Past Surgical History:  Procedure Laterality Date   APPENDECTOMY     CARDIAC CATHETERIZATION  07/2012   [facility]/no stents   CHOLECYSTECTOMY     CORNEAL TRANSPLANT     CORONARY ARTERY BYPASS GRAFT  1994   HEMORRHOID SURGERY     LEFT HEART CATH AND CORS/GRAFTS ANGIOGRAPHY N/A 12/17/2019   Procedure: LEFT HEART CATH AND CORS/GRAFTS ANGIOGRAPHY;  Surgeon: Nelva Bush, MD;  Location: [place];  Service: Cardiovascular;  Laterality: N/A;   PROSTATE BIOPSY     TOE SURGERY     ingrown toe nail    Current Medications: Current Meds  Medication Sig   albuterol (PROVENTIL) (2.5 MG/3ML) 0.083% nebulizer solution Take 2.5 mg by nebulization every 6 (six) hours as needed  for wheezing or shortness of breath.   albuterol (VENTOLIN HFA) 108 (90 Base) MCG/ACT inhaler Inhale into the lungs every 6 (six) hours as needed for wheezing or shortness of breath.   atorvastatin (LIPITOR) 80 MG tablet Take 1 tablet (80 mg total) by mouth daily.   benzonatate (TESSALON) 100 MG capsule Take 100 mg by mouth 3 (three) times daily as needed for cough.   Cholecalciferol 125 MCG (5000 UT) capsule Take 5,000 Units by mouth at bedtime.   clobetasol cream (TEMOVATE) 0.10 % Apply 1 application topically 2 (two) times daily. As needed to hands and elbows   clopidogrel (PLAVIX) 75 MG tablet Take 1 tablet (75 mg total) by mouth daily.   Continuous Blood Gluc Receiver (FREESTYLE LIBRE 2 READER) DEVI Use 1 Device as directed   Continuous Blood Gluc Sensor (FREESTYLE LIBRE 14 DAY SENSOR) MISC USE AS DIRECTED EVERY 14 DAYS   hydrocortisone 2.5 % cream APPLY TOPICALLY TWICE DAILY AS NEEDED TO FACE, CREASE, GROIN, BUTT.   insulin aspart (NOVOLOG) 100 UNIT/ML  injection Take from 14 - 34 units before meals, as directed. Max daily dose is 90 units.   ketoconazole (NIZORAL) 2 % cream APPLY TOPICALLY TWICE DAILY AS NEEDED FOR IRRITATION TO GROIN, FACE, AND BUTTOCKS   levothyroxine (SYNTHROID) 50 MCG tablet TAKE 1 TABLET BY MOUTH ONCE DAILY BEFORE BREAKFAST   losartan (COZAAR) 100 MG tablet Take 1 tablet (100 mg total) by mouth daily.   Magnesium 250 MG TABS Take 250 mg by mouth as needed (swelling). Take when you take your Torsemide.   mometasone (ELOCON) 0.1 % ointment Apply topically daily. As needed to scalp and ears   nitroGLYCERIN (NITROSTAT) 0.4 MG SL tablet Place 1 tablet (0.4 mg total) under the tongue every 5 (five) minutes as needed for chest pain.   omeprazole (PRILOSEC) 20 MG capsule Take 1 capsule by mouth once daily   [DISCONTINUED] insulin aspart (NOVOLOG) 100 UNIT/ML injection Inject into the skin 3 (three) times daily before meals. Inject 34 units in the morning / 24 units at 12:00pm / and 14 units at night time.   [DISCONTINUED] insulin glargine (LANTUS) 100 UNIT/ML injection Inject 50 Units into the skin daily.   [DISCONTINUED] potassium chloride (KLOR-CON) 10 MEQ tablet Take 1 tablet (10 mEq total) by mouth every Monday, Wednesday, and Friday.   [DISCONTINUED] torsemide (DEMADEX) 20 MG tablet Take 1 tablet (20 mg total) by mouth every Monday, Wednesday, and Friday.     Allergies:   Patient has no known allergies.   Social History   Socioeconomic History   Marital status: Widowed    Spouse name: Not on file   Number of children: 3   Years of education: Not on file   Highest education level: Some college, no degree  Occupational History   Occupation: retired  Tobacco Use   Smoking status: Former    Packs/day: 0.00    Years: 6.00    Pack years: 0.00    Types: Pipe, Cigars, Cigarettes    Quit date: 12/23/1980    Years since quitting: 41.3   Smokeless tobacco: Never  Vaping Use   Vaping Use: Never used  Substance and Sexual  Activity   Alcohol use: No   Drug use: No   Sexual activity: Never  Other Topics Concern   Not on file  Social History Narrative   Retired.    Social Determinants of Health   Financial Resource Strain: Low Risk    Difficulty of Paying Living Expenses:  Not hard at all  Food Insecurity: No Food Insecurity   Worried About Charity fundraiser in the Last Year: Never true   Ran Out of Food in the Last Year: Never true  Transportation Needs: No Transportation Needs   Lack of Transportation (Medical): No   Lack of Transportation (Non-Medical): No  Physical Activity: Insufficiently Active   Days of Exercise per Week: 2 days   Minutes of Exercise per Session: 20 min  Stress: No Stress Concern Present   Feeling of Stress : Not at all  Social Connections: Moderately Integrated   Frequency of Communication with Friends and Family: More than three times a week   Frequency of Social Gatherings with Friends and Family: Once a week   Attends Religious Services: More than 4 times per year   Active Member of [employer] or Organizations: No   Attends Music therapist: More than 4 times per year   Marital Status: Widowed     Family History: The patient's family history includes Heart attack in his father; Heart failure in his maternal grandfather, maternal uncle, and mother; Kidney Stones in his son; Sleep apnea in his daughter and son; Stroke in his brother.  ROS:   Please see the history of present illness.     All other systems reviewed and are negative.  EKGs/Labs/Other Studies Reviewed:    The following studies were reviewed today:   Echo 02/2021 1. Left ventricular ejection fraction, by estimation, is 45 to 50%. The  left ventricle has mildly decreased function. The left ventricle has no  regional wall motion abnormalities. There is mild left ventricular  hypertrophy. Left ventricular diastolic  parameters are consistent with Grade I diastolic dysfunction (impaired   relaxation).   2. Right ventricular systolic function is normal. The right ventricular  size is normal.   3. The mitral valve is normal in structure. No evidence of mitral valve  regurgitation.   4. The aortic valve is tricuspid. Aortic valve regurgitation is not  visualized. Mild to moderate aortic valve sclerosis/calcification is  present, without any evidence of aortic stenosis.   Cardiac cath 11/2021 The left ventricular ejection fraction is 35-45% by visual estimate.   Conclusions: Severe native CAD, including 80% distal LMCA, 100% proximal LAD and diffuse LCx/OM disease.  90% mid RCA stenosis and CTO of rPDA also present. Patent LIMA->D3 with backfilling of the mid/distal LADl. Patent SVG-D1-D2, SVG-OM1-OM2, and SVG-rPDA-rPL. Mildly to moderately elevated LVEDP. At least moderately reduced left ventricular systolic function.   Recommendations: Medical therapy; recommend up to 12 months of DAPT with ASA and clopidogrel, as tolerated. Aggressive secondary prevention of coronary artery disease. Obtain echocardiogram to better assess LVEF.. Monitor on telemetry.  Consider EP consultation of AV block persists, particularly if the patient is symptoms.   Nelva Bush, MD [facility]    EKG:  EKG is ordered today.  The ekg ordered today demonstrates NSR with sinus arrhythmia, 98bpm, LAD, QRS 134m, no change  Recent Labs: No results found for requested labs within last 8760 hours.  Recent Lipid Panel    Component Value Date/Time   CHOL 121 12/17/2019 0749   CHOL 122 05/14/2018 0824   TRIG 98 12/17/2019 0749   HDL 34 (L) 12/17/2019 0749   HDL 34 (L) 05/14/2018 0824   CHOLHDL 3.6 12/17/2019 0749   VLDL 20 12/17/2019 0749   LDLCALC 67 12/17/2019 0749   LDLCALC 55 05/14/2018 0824    Physical Exam:    VS:  BP (Marland Kitchen  150/60 (BP Location: Left Arm, Patient Position: Sitting, Cuff Size: Normal)   Pulse 98   Ht '5\' 7"'$  (1.702 m)   Wt 215 lb 6 oz (97.7 kg)   SpO2 93%   BMI  33.73 kg/m     Wt Readings from Last 3 Encounters:  04/20/22 210 lb (95.3 kg)  04/19/22 215 lb 6 oz (97.7 kg)  03/02/22 218 lb 9.6 oz (99.2 kg)     GEN:  Well nourished, well developed in no acute distress HEENT: Normal NECK: No JVD; No carotid bruits LYMPHATICS: No lymphadenopathy CARDIAC: RRR, no murmurs, rubs, gallops RESPIRATORY:  mild crackles at bases ABDOMEN: Soft, non-tender, non-distended MUSCULOSKELETAL:  1+ lower leg edema; No deformity  SKIN: Warm and dry NEUROLOGIC:  Alert and oriented x 3 PSYCHIATRIC:  Normal affect   ASSESSMENT:    1. Dyspnea on exertion   2. Coronary artery disease of native artery of native heart with stable angina pectoris ([place])   3. Chronic systolic heart failure ([place])   4. Coronary artery disease involving native coronary artery of native heart without angina pectoris   5. Essential hypertension    PLAN:    In order of problems listed above:  Shortness of breath Acute HFmrEF BNP 308 yesterday at ER visit with HS trop 22. He reports DOE and orthopnea. He is on Torsemide thee times weekly. On exam he has lower leg edema and some mild crackles. I recommend he take Torsemide '40mg'$  today followed by torsemide '20mg'$  daily. I will repeat an echocardiogram. Follow-up in 2 weeks. BMET at that time. We also discussed non-cardiac issues for breathing issues, may want to see pulmonology.   CAD s/p remote CABG Last heart cath in 2021 (report above) recommended medical management. He denies chest pain. Aspirin held for nose bleed. Continue Plavix. No BB with AV block.   HTN BP mildly elevated today. Continue Losartan '100mg'$  daily.   Disposition: Follow up in 2 week(s) with APP    Signed, Morrie Daywalt Arlyss Repress  04/20/2022 7:37 PM    [facility]

## 2022-04-20 ENCOUNTER — Encounter: Payer: Self-pay | Admitting: Internal Medicine

## 2022-04-20 ENCOUNTER — Ambulatory Visit: Payer: HMO | Admitting: Internal Medicine

## 2022-04-20 VITALS — BP 150/68 | HR 80 | Ht 67.0 in | Wt 210.0 lb

## 2022-04-20 DIAGNOSIS — Z79899 Other long term (current) drug therapy: Secondary | ICD-10-CM | POA: Diagnosis not present

## 2022-04-20 DIAGNOSIS — E1159 Type 2 diabetes mellitus with other circulatory complications: Secondary | ICD-10-CM | POA: Diagnosis not present

## 2022-04-20 DIAGNOSIS — I2510 Atherosclerotic heart disease of native coronary artery without angina pectoris: Secondary | ICD-10-CM

## 2022-04-20 DIAGNOSIS — I152 Hypertension secondary to endocrine disorders: Secondary | ICD-10-CM

## 2022-04-20 DIAGNOSIS — I5043 Acute on chronic combined systolic (congestive) and diastolic (congestive) heart failure: Secondary | ICD-10-CM | POA: Diagnosis not present

## 2022-04-20 MED ORDER — POTASSIUM CHLORIDE ER 10 MEQ PO TBCR: 10.0000 meq | EXTENDED_RELEASE_TABLET | Freq: Every day | ORAL | 0 refills | Status: DC

## 2022-04-20 NOTE — Progress Notes (Signed)
Follow-up Outpatient Visit Date: 04/20/2022  Primary Care Provider: [name]., MD [address] [place] [address]  Primary Cardiologist: Esmond Plants, MD PhD  Chief Complaint: Swelling and shortness of breath  HPI:  Joseph [name] is a 88 y.o. male with history of coronary artery disease status post CABG in 0630, chronic systolic and diastolic heart failure, PAD status post right SFA stenting, bilateral carotid artery stenoses, Mobitz type I second-degree AV block, SVT, NSVT, hyperlipidemia, type 2 diabetes mellitus, hypothyroidism, and arthritis, who presents for reevaluation of shortness of breath.  He was referred to the [facility] ED from urgent care 2 days ago complaining of shortness of breath, cough, epistaxis, and worsening of leg edema.  He left without being seen.  Labs were notable for BNP of 308 and glucose of 255.  Chest radiograph showed prominence of the vasculature and interstitium that could represent pulmonary edema EKG showed sinus rhythm with left axis deviation, poor R wave progression, and LVH with QRS widening and abnormal repolarization.  He was seen yesterday in our office by Cadence Furth, PA, for evaluation of the same symptoms.  He was advised to increase his torsemide, though his daughter subsequently contacted our office after the visit noting that her father had not been taking any torsemide leading up to the visit.  The patient and his daughter asked to be seen again today because they prefer to see an MD.  Today, Joseph [name] notes that he is feeling noticeably better than yesterday.  He took torsemide 40 mg x 1 yesterday with significant urine output.  He has lost 5 pounds in 1 day.  He is concerned about continued diuresis, as this has caused cramps in the past.  He still has some shortness of breath and cough, though this is quite a bit better than yesterday.  He also has some sinus congestion and rhinorrhea.  He denies chest pain,  palpitations, and lightheadedness.  He is scheduled for echocardiogram at [facility] next Thursday.  --------------------------------------------------------------------------------------------------  Past Medical History:  Diagnosis Date   Acute respiratory failure with hypoxia ([place]) 03/21/2016   CAD (coronary artery disease)    a. s/p CABG;  b. 07/2012 Cath: 3VD including 80 dLCX (med Rx), 4/4 patent grafts.   Chronic diastolic CHF (congestive heart failure) ([place])    a. 07/2012 Echo: EF 55-60%, no rwma, Gr 1 DD, mild MR;  04/2014 Echo: EF 55-60%, no rwma, mild MR, mildly dil LA.   Diabetes mellitus, type 2 ([place])    HTN (hypertension)    Hyperlipidemia    Hypothyroidism    Lower extremity edema    a. 03/2014 amlodipine d/c'd.   Spinal stenosis    Past Surgical History:  Procedure Laterality Date   APPENDECTOMY     CARDIAC CATHETERIZATION  07/2012   [facility]/no stents   CHOLECYSTECTOMY     CORNEAL TRANSPLANT     CORONARY ARTERY BYPASS GRAFT  1994   HEMORRHOID SURGERY     LEFT HEART CATH AND CORS/GRAFTS ANGIOGRAPHY N/A 12/17/2019   Procedure: LEFT HEART CATH AND CORS/GRAFTS ANGIOGRAPHY;  Surgeon: Nelva Bush, MD;  Location: [facility];  Service: Cardiovascular;  Laterality: N/A;   PROSTATE BIOPSY     TOE SURGERY     ingrown toe nail    Current Meds  Medication Sig   albuterol (PROVENTIL) (2.5 MG/3ML) 0.083% nebulizer solution Take 2.5 mg by nebulization every 6 (six) hours as needed for wheezing or shortness of breath.  albuterol (VENTOLIN HFA) 108 (90 Base) MCG/ACT inhaler Inhale into the lungs every 6 (six) hours as needed for wheezing or shortness of breath.   benzonatate (TESSALON) 100 MG capsule Take 100 mg by mouth 3 (three) times daily as needed for cough.   Cholecalciferol 125 MCG (5000 UT) capsule Take 5,000 Units by mouth at bedtime.   clobetasol cream (TEMOVATE) 7.03 % Apply 1 application topically 2 (two) times daily. As needed to hands and elbows   clopidogrel  (PLAVIX) 75 MG tablet Take 1 tablet (75 mg total) by mouth daily.   Continuous Blood Gluc Receiver (FREESTYLE LIBRE 2 READER) DEVI Use 1 Device as directed   Continuous Blood Gluc Sensor (FREESTYLE LIBRE 14 DAY SENSOR) MISC USE AS DIRECTED EVERY 14 DAYS   hydrocortisone 2.5 % cream APPLY TOPICALLY TWICE DAILY AS NEEDED TO FACE, CREASE, GROIN, BUTT.   insulin aspart (NOVOLOG) 100 UNIT/ML injection Take from 14 - 34 units before meals, as directed. Max daily dose is 90 units.   insulin glargine (LANTUS) 100 UNIT/ML injection Inject 40 Units into the skin daily.   ketoconazole (NIZORAL) 2 % cream APPLY TOPICALLY TWICE DAILY AS NEEDED FOR IRRITATION TO GROIN, FACE, AND BUTTOCKS   levothyroxine (SYNTHROID) 50 MCG tablet TAKE 1 TABLET BY MOUTH ONCE DAILY BEFORE BREAKFAST   losartan (COZAAR) 100 MG tablet Take 1 tablet (100 mg total) by mouth daily.   Magnesium 250 MG TABS Take 250 mg by mouth as needed (swelling). Take when you take your Torsemide.   mometasone (ELOCON) 0.1 % ointment Apply topically daily. As needed to scalp and ears   nitroGLYCERIN (NITROSTAT) 0.4 MG SL tablet Place 1 tablet (0.4 mg total) under the tongue every 5 (five) minutes as needed for chest pain.   omeprazole (PRILOSEC) 20 MG capsule Take 1 capsule by mouth once daily   potassium chloride (KLOR-CON) 10 MEQ tablet Take 1 tablet (10 mEq total) by mouth every Monday, Wednesday, and Friday.   torsemide (DEMADEX) 20 MG tablet Take 1 tablet (20 mg total) by mouth daily.    Allergies: Patient has no known allergies.  Social History   Tobacco Use   Smoking status: Former    Packs/day: 0.00    Years: 6.00    Pack years: 0.00    Types: Pipe, Cigars, Cigarettes    Quit date: 12/23/1980    Years since quitting: 41.3   Smokeless tobacco: Never  Vaping Use   Vaping Use: Never used  Substance Use Topics   Alcohol use: No   Drug use: No    Family History  Problem Relation Age of Onset   Heart failure Mother    Heart attack  Father    Stroke Brother    Heart failure Maternal Uncle    Heart failure Maternal Grandfather    Sleep apnea Daughter    Sleep apnea Son    Kidney Stones Son     Review of Systems: A 12-system review of systems was performed and was negative except as noted in the HPI.  --------------------------------------------------------------------------------------------------  Physical Exam: BP (!) 150/68 (BP Location: Right Arm, Patient Position: Sitting, Cuff Size: Large)   Pulse 80   Ht '5\' 7"'$  (1.702 m)   Wt 210 lb (95.3 kg)   SpO2 95%   BMI 32.89 kg/m   General:  NAD. Neck: No JVD or HJR. Lungs: Clear to auscultation bilaterally without wheezes or crackles. Heart: Regular rate and rhythm without murmurs, rubs, or gallops. Abdomen: Soft, nontender, nondistended. Extremities: Trace  right and 1+ left calf edema.  Lab Results  Component Value Date   WBC 5.6 05/11/2020   HGB 13.8 05/11/2020   HCT 40.6 05/11/2020   MCV 89 05/11/2020   PLT 222 05/11/2020    Lab Results  Component Value Date   NA 140 03/11/2021   K 4.5 03/11/2021   CL 102 03/11/2021   CO2 22 03/11/2021   BUN 16 03/11/2021   CREATININE 1.08 03/11/2021   GLUCOSE 280 (H) 03/11/2021   ALT 21 12/16/2019    Lab Results  Component Value Date   CHOL 121 12/17/2019   HDL 34 (L) 12/17/2019   LDLCALC 67 12/17/2019   TRIG 98 12/17/2019   CHOLHDL 3.6 12/17/2019    --------------------------------------------------------------------------------------------------  ASSESSMENT AND PLAN: Acute on chronic systolic and diastolic heart failure: Joseph [name] has improved significantly since yesterday in regard to his symptoms and fluid retention following 1 dose of torsemide 40 mg after yesterday's visit with Ms. Kathlen Mody.  He still reports NYHA class III-IV symptoms.  I agree with continuing torsemide 20 mg daily.  We will also have Joseph [name] take potassium chloride 10 mEq daily.  He should move forward with  echocardiogram that is scheduled for a week from tomorrow.  We will also plan to check a BMP and magnesium level at that time.  I encouraged him to minimize his sodium intake and to also restrict his fluid to 2 L.  Coronary artery disease: No angina reported.  While underlying ischemic heart disease could be contributing to his heart failure, I would like to defer ischemia evaluation if possible given Joseph [name] age.  Of note, last catheterization in 2021 showed patent bypass grafts with underlying severe native CAD.  Continue atorvastatin and clopidogrel for secondary prevention.  Hypertension associated with type 2 diabetes mellitus: Blood pressure moderately elevated today.  In the setting of acute heart failure and persistent volume overload, I will not escalate Joseph [name]' blood pressure regimen.  He should continue losartan 100 mg daily for the time being.  Ongoing management of diabetes mellitus per Dr. Rosanna Randy.  Follow-up: Return to clinic next available with Dr. Rockey Situ or as previously scheduled with Cadence Furth, [place], on 05/10/2022 (which ever is available first).  Nelva Bush, MD 04/20/2022 11:11 AM

## 2022-04-20 NOTE — Patient Instructions (Signed)
Medication Instructions:   Your physician has recommended you make the following change in your medication:   CONTINUE Torsemide 20 mg daily   INCREASE Potassium 10 mEq daily   *If you need a refill on your cardiac medications before your next appointment, please call your pharmacy*   Lab Work:  Your physician recommends that you return for lab work on the [employer] as your Echo at [facility] 04/28/22  (BMET, Magnesium) -  You do NOT have to be fasting for these labs.  -  Please go to the [place] at [place] in at the Registration Desk: 1st desk to the right, past the screening table -  Valet Parking is offered if needed -  No appointment needed -  Lab hours: Monday- Friday (7:30 am- 5:30 pm)    Testing/Procedures:  Keep echocardiogram as scheduled at [facility] 04/28/22 at 11:00 AM (Arrive at 10:45 AM)  -  Please go to the [place] at [place] in at the Registration Desk: 1st desk to the right, past the screening table -  Valet Parking is offered if needed   Follow-Up: At [facility], you and your health needs are our priority.  As part of our continuing mission to provide you with exceptional heart care, we have created designated Provider Care Teams.  These Care Teams include your primary Cardiologist (physician) and Advanced Practice Providers (APPs -  Physician Assistants and Nurse Practitioners) who all work together to provide you with the care you need, when you need it.  We recommend signing up for the patient portal called "MyChart".  Sign up information is provided on this After Visit Summary.  MyChart is used to connect with patients for Virtual Visits (Telemedicine).  Patients are able to view lab/test results, encounter notes, upcoming appointments, etc.  Non-urgent messages can be sent to your provider as well.   To learn more about what you can do with MyChart, go to [email or web address].    Your next appointment:    Keep follow up  appointment as scheduled.   We will contact you if an opening with Dr. Rockey Situ becomes available  Important Information About Sugar

## 2022-04-21 DIAGNOSIS — E785 Hyperlipidemia, unspecified: Secondary | ICD-10-CM | POA: Diagnosis not present

## 2022-04-21 DIAGNOSIS — E1142 Type 2 diabetes mellitus with diabetic polyneuropathy: Secondary | ICD-10-CM | POA: Diagnosis not present

## 2022-04-21 DIAGNOSIS — E1169 Type 2 diabetes mellitus with other specified complication: Secondary | ICD-10-CM | POA: Diagnosis not present

## 2022-04-21 DIAGNOSIS — Z794 Long term (current) use of insulin: Secondary | ICD-10-CM | POA: Diagnosis not present

## 2022-04-21 DIAGNOSIS — E1159 Type 2 diabetes mellitus with other circulatory complications: Secondary | ICD-10-CM | POA: Diagnosis not present

## 2022-04-21 DIAGNOSIS — E1129 Type 2 diabetes mellitus with other diabetic kidney complication: Secondary | ICD-10-CM | POA: Diagnosis not present

## 2022-04-21 DIAGNOSIS — R809 Proteinuria, unspecified: Secondary | ICD-10-CM | POA: Diagnosis not present

## 2022-04-28 ENCOUNTER — Ambulatory Visit
Admission: RE | Admit: 2022-04-28 | Discharge: 2022-04-28 | Disposition: A | Discharge status: Home / Self Care | Payer: HMO | Source: Ambulatory Visit | Attending: Medical | Admitting: Medical

## 2022-04-28 ENCOUNTER — Other Ambulatory Visit
Admission: RE | Admit: 2022-04-28 | Discharge: 2022-04-28 | Disposition: A | Discharge status: Home / Self Care | Payer: HMO | Source: Ambulatory Visit | Attending: Medical | Admitting: Medical

## 2022-04-28 DIAGNOSIS — Z79899 Other long term (current) drug therapy: Secondary | ICD-10-CM

## 2022-04-28 DIAGNOSIS — E119 Type 2 diabetes mellitus without complications: Secondary | ICD-10-CM | POA: Diagnosis not present

## 2022-04-28 DIAGNOSIS — I2510 Atherosclerotic heart disease of native coronary artery without angina pectoris: Secondary | ICD-10-CM

## 2022-04-28 DIAGNOSIS — I110 Hypertensive heart disease with heart failure: Secondary | ICD-10-CM | POA: Diagnosis not present

## 2022-04-28 DIAGNOSIS — I340 Nonrheumatic mitral (valve) insufficiency: Secondary | ICD-10-CM | POA: Diagnosis not present

## 2022-04-28 DIAGNOSIS — Z951 Presence of aortocoronary bypass graft: Secondary | ICD-10-CM | POA: Diagnosis not present

## 2022-04-28 DIAGNOSIS — R0609 Other forms of dyspnea: Secondary | ICD-10-CM | POA: Diagnosis not present

## 2022-04-28 DIAGNOSIS — R0600 Dyspnea, unspecified: Secondary | ICD-10-CM | POA: Diagnosis not present

## 2022-04-28 DIAGNOSIS — I5043 Acute on chronic combined systolic (congestive) and diastolic (congestive) heart failure: Secondary | ICD-10-CM

## 2022-04-28 DIAGNOSIS — I509 Heart failure, unspecified: Secondary | ICD-10-CM | POA: Diagnosis not present

## 2022-04-28 LAB — BASIC METABOLIC PANEL
Anion gap: 7 (ref 5–15)
BUN: 19 mg/dL (ref 8–23)
CO2: 29 mmol/L (ref 22–32)
Calcium: 9.3 mg/dL (ref 8.9–10.3)
Chloride: 103 mmol/L (ref 98–111)
Creatinine, Ser: 1.04 mg/dL (ref 0.61–1.24)
GFR, Estimated: >60 mL/min (ref >60)
Glucose, Bld: 214 mg/dL — ABNORMAL HIGH (ref 70–99)
Potassium: 4 mmol/L (ref 3.5–5.1)
Sodium: 139 mmol/L (ref 135–145)

## 2022-04-28 LAB — ECHOCARDIOGRAM COMPLETE
AR max vel: 1.72 cm2
AV Area VTI: 1.43 cm2
AV Area mean vel: 1.54 cm2
AV Mean grad: 7 mmHg
AV Peak grad: 13.8 mmHg
Ao pk vel: 1.86 m/s
Area-P 1/2: 4.63 cm2
Calc EF: 38.9 %
MV VTI: 2.42 cm2
S' Lateral: 4.17 cm
Single Plane A2C EF: 37.8 %
Single Plane A4C EF: 39.3 %

## 2022-04-28 LAB — MAGNESIUM: Magnesium: 1.8 mg/dL (ref 1.7–2.4)

## 2022-04-28 MED ORDER — PERFLUTREN LIPID MICROSPHERE
1.0000 mL | INTRAVENOUS | Status: DC | PRN
  Administered 2022-04-28: 2 mL via INTRAVENOUS

## 2022-04-28 NOTE — Progress Notes (Signed)
*  PRELIMINARY RESULTS* Echocardiogram 2D Echocardiogram has been performed.  Joseph [name] 04/28/2022, 12:00 PM

## 2022-05-05 ENCOUNTER — Other Ambulatory Visit: Payer: Self-pay | Admitting: *Deleted

## 2022-05-05 ENCOUNTER — Telehealth: Payer: Self-pay | Admitting: Cardiovascular Disease

## 2022-05-05 MED ORDER — ATORVASTATIN CALCIUM 80 MG PO TABS: 80.0000 mg | ORAL_TABLET | Freq: Every day | ORAL | 1 refills | Status: DC

## 2022-05-05 NOTE — Telephone Encounter (Signed)
FYI--Patient states after 6/08 echo was discussed a heart cath was discussed, but patient states he changed his mind and no longer wants to proceed with scheduling it. He still plans to come in for his appointment on 6/20.

## 2022-05-10 ENCOUNTER — Encounter: Payer: Self-pay | Admitting: Medical

## 2022-05-10 ENCOUNTER — Other Ambulatory Visit
Admission: RE | Admit: 2022-05-10 | Discharge: 2022-05-10 | Disposition: A | Discharge status: Home / Self Care | Payer: HMO | Attending: Medical | Admitting: Medical

## 2022-05-10 ENCOUNTER — Ambulatory Visit: Payer: HMO | Admitting: Medical

## 2022-05-10 VITALS — BP 126/60 | HR 66 | Ht 65.0 in | Wt 213.0 lb

## 2022-05-10 DIAGNOSIS — I10 Essential (primary) hypertension: Secondary | ICD-10-CM | POA: Diagnosis not present

## 2022-05-10 DIAGNOSIS — I5022 Chronic systolic (congestive) heart failure: Secondary | ICD-10-CM

## 2022-05-10 DIAGNOSIS — I2510 Atherosclerotic heart disease of native coronary artery without angina pectoris: Secondary | ICD-10-CM | POA: Diagnosis not present

## 2022-05-10 DIAGNOSIS — I429 Cardiomyopathy, unspecified: Secondary | ICD-10-CM

## 2022-05-10 DIAGNOSIS — Z951 Presence of aortocoronary bypass graft: Secondary | ICD-10-CM | POA: Diagnosis not present

## 2022-05-10 LAB — BASIC METABOLIC PANEL
Anion gap: 8 (ref 5–15)
BUN: 18 mg/dL (ref 8–23)
CO2: 28 mmol/L (ref 22–32)
Calcium: 9.2 mg/dL (ref 8.9–10.3)
Chloride: 104 mmol/L (ref 98–111)
Creatinine, Ser: 1.19 mg/dL (ref 0.61–1.24)
GFR, Estimated: 57 mL/min — ABNORMAL LOW (ref >60)
Glucose, Bld: 152 mg/dL — ABNORMAL HIGH (ref 70–99)
Potassium: 4.4 mmol/L (ref 3.5–5.1)
Sodium: 140 mmol/L (ref 135–145)

## 2022-05-10 MED ORDER — DAPAGLIFLOZIN PROPANEDIOL 10 MG PO TABS: 10.0000 mg | ORAL_TABLET | Freq: Every day | ORAL | 0 refills | Status: DC

## 2022-05-10 NOTE — Patient Instructions (Signed)
Medication Instructions:   Your physician has recommended you make the following change in your medication:   START Farxiga 10 mg daily   *If you need a refill on your cardiac medications before your next appointment, please call your pharmacy*   Lab Work:  Today at the medical mall: BMET  -  Please go to the [place] Entrance at [address] in at the Registration Desk: 1st desk to the right, past the screening table   If you have labs (blood work) drawn today and your tests are completely normal, you will receive your results only by: [place] (if you have MyChart) OR A paper copy in the mail If you have any lab test that is abnormal or we need to change your treatment, we will call you to review the results.   Testing/Procedures:  None ordered   Follow-Up: At [facility], you and your health needs are our priority.  As part of our continuing mission to provide you with exceptional heart care, we have created designated Provider Care Teams.  These Care Teams include your primary Cardiologist (physician) and Advanced Practice Providers (APPs -  Physician Assistants and Nurse Practitioners) who all work together to provide you with the care you need, when you need it.  We recommend signing up for the patient portal called "MyChart".  Sign up information is provided on this After Visit Summary.  MyChart is used to connect with patients for Virtual Visits (Telemedicine).  Patients are able to view lab/test results, encounter notes, upcoming appointments, etc.  Non-urgent messages can be sent to your provider as well.   To learn more about what you can do with MyChart, go to [email or web address].    Your next appointment:   2 week(s)  The format for your next appointment:   In Person  Provider:   You may see Ida Rogue, MD or one of the following Advanced Practice Providers on your designated Care Team:   Murray Hodgkins, NP Christell Faith, PA-C Cadence  Kathlen Mody, PA-C{   Important Information About Sugar

## 2022-05-10 NOTE — Progress Notes (Signed)
Cardiology Office Note:    Date:  05/10/2022   ID:  Joseph [name], DOB 06/25/1925, MRN [id number]  PCP:  Antony Madura, PA-C  [facility] Cardiologist:  Ida Rogue, MD  [facility] Electrophysiologist:  None   Referring MD: [name].,*   Chief Complaint: 2-3 week follow-up  History of Present Illness:    Joseph [name] is a 88 y.o. male with a hx of CAD s/p four-vessel CABG in 1994, 11/2019 non-STEMI with subsequent LHC 12/17/2019 as outlined below, chronic combined CHF (EF 45-50%),PAD with reported right SFA stenting, bilateral carotid dz with imaging in 2018 showing 1-39% stenosis, Mobitz type I AVB, SVT, NSVT, hyperlipidemia, poorly controlled DM2, hypothyroidism, history of tobacco use via pipe smoking for 15 to 20 years (quit 1982), arthritis of the knees, history of fatigue, shortness of breath, generalized weakness (felt to be his anginal equivalent), and here today for follow-up.    He has a significant history of CAD, including remote history of CABG x4 in 1994.  He underwent LHC 08/16/2012 in the setting of progressive shortness of breath and fatigue, felt to possibly be his anginal equivalent.  This LHC showed severe three-vessel disease with patent grafts x4.  There is 80% disease of a small to moderate size distal left circumflex.  EF 45 to 50%.  Findings were discussed with interventional cardiology and medical management recommended for the distal left circumflex disease.  06/2012 echo showed EF 55 to 60%, mild MR, no regional wall motion abnormalities, and G1DD.  He was seen 04/2014 with progressive DOE and HCTZ changed to Lasix. He diuresed 12 pounds but still felt SOB after diuresis.  Notes indicated suspicion that SOB may be his anginal equivalent.  Echo 04/2014 showed EF 55 to 60% and mild MR, and mild LAE.  Per EMR, cardiac catheterization was recommended; however, this LHC was declined / deferred by the patient.  Imdur was added.    He was seen  03/17/2016 with decline over the previous several months, including inability to walk greater than 100 feet without having to stop and take a break to rest.  He was unable to take the trash out or climbing stairs.  His weight was stable at 230 pounds with 3 pillow orthopnea in the setting of GERD.  Cardiac catheterization was recommended and scheduled 03/31/2016; however, this was subsequently cancelled after he was admitted before his LHC to [facility] with AKI and hypoxia, as well as cholecystitis.  He underwent cholecystectomy.  He was seen at follow-up 05/16/2016 and was reportedly doing well with no indication for rescheduling LHC noted by his primary cardiologist due to lack of angina.   He was admitted 11/2019 after report of chest pain in the office that had started 4 days earlier.  Chest pain had started at rest during the evening and was associated with some belching, which relieved the discomfort.  He had reportedly had a large dinner that night and also was often constipated. However, the CP still continued and recommendation was for admission given Tn of 588 in office. EKG showed NSR with PVCs in a bigeminal pattern.  Subsequent EKG showed NSR, Mobitz type I, AVB, LAD, LBBB.  Telemetry was significant for Mobitz type I second-degree AV block.  Plan on admission was for cardiac catheterization 2/2 non-STEMI.     LHC 12/17/2019 was performed and showed severe CAD.  Cath showed 80% dLMCA, 100% pLAD, and diffuse LCx/OM disease.  90% MRCA stenosis and CTO of the RPDA  was also present.  He had a patent LIMA-D3 with backfilling of the mid/distal LAD. Also noted was patent SVG-D1-D2, SVG to OM 1-OM 2, and SVG-RPDA-RPL.  Mild to moderately elevated LVEDP and moderately reduced LVSF was also noted.  Subsequent echo showed EF 50 to 55%/low normal function with mild LVH, moderate hypokinesis of the left ventricle and mid anterior septal wall, G1DD, LV RWMA, mildly reduced RVSF, mild LAE, mild MR, trivial TR, mild to  moderate AV sclerosis/calcification without evidence of aortic stenosis, and aortic dilation/dilation of the aortic root and ascending aorta measuring 38 mm.  Recommendations were for medical therapy with DAPT (ASA and clopidogrel) for at least 12 months.     Given his telemetry and EKG findings, EP consultation of AV block was recommended, especially if ongoing symptoms.  He subsequently underwent cardiac monitoring as below with NSR (average heart rate 62 bpm), 4 runs of VT (fastest 4 beats & max rate 222 bpm; longest 15 beats and average rate 120 bpm), 3 runs of SVT (fastest 5 beats, max rate 135 bpm; longest 6 beats, average rate 100 bpm), second-degree AV block Mobitz type I, isolated PACs, atrial couplets/triplets, occasional PVCs, rare ventricular couplets and triplets, and ventricular bigeminy and trigeminy.   Seen in April 2022 for dizziness. Torsemide was decreased. Repeat echo showed LVEF 45-50%, G1DD.   ER visit 5/29 for chest pain and shortness of breath. BNP 308. HS trop 22. BNP wnl. CXR unremarkable. He left because he needed food for his diabetes.   Seen in the office 5/30 and 5/31 recommended taking Torsemide '40mg'$  once followed by Torsemide '20mg'$  daily. He was also started on potassium. Echo was ordered. Echo showed reduced LVEF 35-40%, severe HK of the bassal to mid anterior and anteroseptal wall. Mild LVH, G1DD, mild MR, mild ascending aortic dilation to 4m.   Today, echo was reviewed in detail. The patient is overall doing better. Lower leg swelling is better. He is taking torsemide '20mg'$  daily. Weight is down to 205lbs. He eats low salt or no salt. He has compression socks, but doesn't wear them. No chest pain.   Past Medical History:  Diagnosis Date   Acute respiratory failure with hypoxia ([facility] 03/21/2016   CAD (coronary artery disease)    a. s/p CABG;  b. 07/2012 Cath: 3VD including 80 dLCX (med Rx), 4/4 patent grafts.   [place]ronic diastolic CH[place] (congestive heart failure[place]     a. 07/2012 Echo: EF 55-60%, no rwma, Gr 1 DD, mild MR;  04/2014 Echo: EF 55-60%, no rwma, mild MR, mildly dil LA.   Diabetes mellitus, type 2 ([place]    HTN (hypertension)    Hyperlipidemia    Hypothyroidism    Lower extremity edema    a. 03/2014 amlodipine d/c'd.   Spinal stenosis     Past Surgical History:  Procedure Laterality Date   APPENDECTOMY     CARDIAC CATHETERIZATION  07/2012   [facility]/no stents   CHOLECYSTECTOMY     CORNEAL TRANSPLANT     CORONARY ARTERY BYPASS GRAFT  1994   HEMORRHOID SURGERY     LEFT HEART CATH AND CORS/GRAFTS ANGIOGRAPHY N/A 12/17/2019   Procedure: LEFT HEART CATH AND CORS/GRAFTS ANGIOGRAPHY;  Surgeon: ENelva Bush MD;  [facility];  Service: Cardiovascular;  Laterality: N/A;   PROSTATE BIOPSY     TOE SURGERY     ingrown toe nail    Current Medications: Current Meds  Medication Sig   atorvastatin (LIPITOR) 80 MG tablet Take  1 tablet (80 mg total) by mouth daily.   Cholecalciferol 125 MCG (5000 UT) capsule Take 5,000 Units by mouth at bedtime.   clobetasol cream (TEMOVATE) 9.47 % Apply 1 application topically 2 (two) times daily. As needed to hands and elbows   clopidogrel (PLAVIX) 75 MG tablet Take 1 tablet (75 mg total) by mouth daily.   Continuous Blood Gluc Receiver (FREESTYLE LIBRE 2 READER) DEVI Use 1 Device as directed   Continuous Blood Gluc Sensor (FREESTYLE LIBRE 14 DAY SENSOR) MISC USE AS DIRECTED EVERY 14 DAYS   dapagliflozin propanediol (FARXIGA) 10 MG TABS tablet Take 1 tablet (10 mg total) by mouth daily before breakfast.   hydrocortisone 2.5 % cream APPLY TOPICALLY TWICE DAILY AS NEEDED TO FACE, CREASE, GROIN, BUTT.   insulin aspart (NOVOLOG) 100 UNIT/ML injection Take from 14 - 34 units before meals, as directed. Max daily dose is 90 units.   insulin glargine (LANTUS) 100 UNIT/ML injection Inject 40 Units into the skin daily.   ketoconazole (NIZORAL) 2 % cream APPLY TOPICALLY TWICE DAILY AS NEEDED FOR IRRITATION TO  GROIN, FACE, AND BUTTOCKS   losartan (COZAAR) 100 MG tablet Take 1 tablet (100 mg total) by mouth daily.   Magnesium 250 MG TABS Take 250 mg by mouth as needed (swelling). Take when you take your Torsemide.   mometasone (ELOCON) 0.1 % ointment Apply topically daily. As needed to scalp and ears   nitroGLYCERIN (NITROSTAT) 0.4 MG SL tablet Place 1 tablet (0.4 mg total) under the tongue every 5 (five) minutes as needed for chest pain.   omeprazole (PRILOSEC) 20 MG capsule Take 1 capsule by mouth once daily   potassium chloride (KLOR-CON) 10 MEQ tablet Take 1 tablet (10 mEq total) by mouth daily.   torsemide (DEMADEX) 20 MG tablet Take 1 tablet (20 mg total) by mouth daily.     Allergies:   Patient has no known allergies.   Social History   Socioeconomic History   Marital status: Widowed    Spouse name: Not on file   Number of children: 3   Years of education: Not on file   Highest education level: Some college, no degree  Occupational History   Occupation: retired  Tobacco Use   Smoking status: Former    Packs/day: 0.00    Years: 6.00    Total pack years: 0.00    Types: Pipe, Cigars, Cigarettes    Quit date: 12/23/1980    Years since quitting: 41.4   Smokeless tobacco: Never  Vaping Use   Vaping Use: Never used  Substance and Sexual Activity   Alcohol use: No   Drug use: No   Sexual activity: Never  Other Topics Concern   Not on file  Social History Narrative   Retired.    Social Determinants of Health   Financial Resource Strain: Low Risk  (03/02/2022)   Overall Financial Resource Strain (CARDIA)    Difficulty of Paying Living Expenses: Not hard at all  Food Insecurity: No Food Insecurity (03/02/2022)   Hunger Vital Sign    Worried About Running Out of Food in the Last Year: Never true    Ran Out of Food in the Last Year: Never true  Transportation Needs: No Transportation Needs (03/02/2022)   PRAPARE - Hydrologist (Medical): No    Lack of  Transportation (Non-Medical): No  Physical Activity: Insufficiently Active (03/02/2022)   Exercise Vital Sign    Days of Exercise per Week: 2 days  Minutes of Exercise per Session: 20 min  Stress: No Stress Concern Present (03/02/2022)   [place]    Feeling of Stress : Not at all  Social Connections: Moderately Integrated (03/02/2022)   Social Connection and Isolation Panel [NHANES]    Frequency of Communication with Friends and Family: More than three times a week    Frequency of Social Gatherings with Friends and Family: Once a week    Attends Religious Services: More than 4 times per year    Active Member of [employer] or Organizations: No    Attends Music therapist: More than 4 times per year    Marital Status: Widowed     Family History: The patient's family history includes Heart attack in his father; Heart failure in his maternal grandfather, maternal uncle, and mother; Kidney Stones in his son; Sleep apnea in his daughter and son; Stroke in his brother.  ROS:   Please see the history of present illness.     All other systems reviewed and are negative.  EKGs/Labs/Other Studies Reviewed:    The following studies were reviewed today:   Echo 04/28/22   1. Left ventricular ejection fraction, by estimation, is 45 to 50%. The  left ventricle has mildly decreased function. The left ventricle has no  regional wall motion abnormalities. There is mild left ventricular  hypertrophy. Left ventricular diastolic  parameters are consistent with Grade I diastolic dysfunction (impaired  relaxation).   2. Right ventricular systolic function is normal. The right ventricular  size is normal.   3. The mitral valve is normal in structure. No evidence of mitral valve  regurgitation.   4. The aortic valve is tricuspid. Aortic valve regurgitation is not  visualized. Mild to moderate aortic valve sclerosis/calcification  is  present, without any evidence of aortic stenosis.    Echo 02/2021 1. Left ventricular ejection fraction, by estimation, is 45 to 50%. The  left ventricle has mildly decreased function. The left ventricle has no  regional wall motion abnormalities. There is mild left ventricular  hypertrophy. Left ventricular diastolic  parameters are consistent with Grade I diastolic dysfunction (impaired  relaxation).   2. Right ventricular systolic function is normal. The right ventricular  size is normal.   3. The mitral valve is normal in structure. No evidence of mitral valve  regurgitation.   4. The aortic valve is tricuspid. Aortic valve regurgitation is not  visualized. Mild to moderate aortic valve sclerosis/calcification is  present, without any evidence of aortic stenosis.    Cardiac cath 11/2021 The left ventricular ejection fraction is 35-45% by visual estimate.   Conclusions: Severe native CAD, including 80% distal LMCA, 100% proximal LAD and diffuse LCx/OM disease.  90% mid RCA stenosis and CTO of rPDA also present. Patent LIMA->D3 with backfilling of the mid/distal LADl. Patent SVG-D1-D2, SVG-OM1-OM2, and SVG-rPDA-rPL. Mildly to moderately elevated LVEDP. At least moderately reduced left ventricular systolic function.   Recommendations: Medical therapy; recommend up to 12 months of DAPT with ASA and clopidogrel, as tolerated. Aggressive secondary prevention of coronary artery disease. Obtain echocardiogram to better assess LVEF.. Monitor on telemetry.  Consider EP consultation of AV block persists, particularly if the patient is symptoms.   Nelva Bush, MD [facility]  EKG:  EKG is not ordered today.   Recent Labs: 04/28/2022: Magnesium 1.8 05/10/2022: BUN 18; Creatinine, Ser 1.19; Potassium 4.4; Sodium 140  Recent Lipid Panel    Component Value Date/Time  CHOL 121 12/17/2019 0749   CHOL 122 05/14/2018 0824   TRIG 98 12/17/2019 0749   HDL 34 (L) 12/17/2019 0749    HDL 34 (L) 05/14/2018 0824   CHOLHDL 3.6 12/17/2019 0749   VLDL 20 12/17/2019 0749   LDLCALC 67 12/17/2019 0749   LDLCALC 55 05/14/2018 0824    Physical Exam:    VS:  BP 126/60 (BP Location: Left Arm, Patient Position: Sitting, Cuff Size: Large)   Pulse 66   Ht '5\' 5"'$  (1.651 m)   Wt 213 lb (96.6 kg)   SpO2 97%   BMI 35.45 kg/m     Wt Readings from Last 3 Encounters:  05/10/22 213 lb (96.6 kg)  04/20/22 210 lb (95.3 kg)  04/19/22 215 lb 6 oz (97.7 kg)     GEN:  Well nourished, well developed in no acute distress HEENT: Normal NECK: No JVD; No carotid bruits LYMPHATICS: No lymphadenopathy CARDIAC: RRR, no murmurs, rubs, gallops RESPIRATORY:  Clear to auscultation without rales, wheezing or rhonchi  ABDOMEN: Soft, non-tender, non-distended MUSCULOSKELETAL:  No edema; No deformity  SKIN: Warm and dry NEUROLOGIC:  Alert and oriented x 3 PSYCHIATRIC:  Normal affect   ASSESSMENT:    1. Coronary artery disease involving native coronary artery of native heart without angina pectoris   2. Chronic systolic heart failure ([place])   3. Essential hypertension   4. S/P CABG (coronary artery bypass graft)   5. Cardiomyopathy, unspecified type ([place])    PLAN:    In order of problems listed above:  HFrEF LVEF 35-40% Cardiomyopathy, suspected ischemic Patient is euvolemic on exam today. He is taking Torsemide '20mg'$  daily. Weights are stable around 204lbs. He eats low salt. BMET today. Echo showed further reduced EF 35-40% from 45-50% in 2022 and 50-55% in 2021. It also showed WMA (report above). A long discussion regarding options: cardiac cath vs MPI vs medications management.  We decided to pursue medications, which I think is reasonable given age, function and other comorbidities. He is on Losartan '100mg'$  daily. I will add Farxiga '10mg'$  daily. Follow-up in 2 weeks to continue GDMT. No BB with h/o AV block. We will re-check an echo once he has been on GDMT.   CAD s/p remote CABG Patient  denies chest pain or SOB. EF reduced as above. LHC in 2021 (report above) showed severe CAD, recommended medication management. Plan to continue medical management. Continue Plavix and Lipitor. No BB with AV block.   HTN BP is good today. Monitor with addition of medications. Continue current regiment.   Disposition: Follow up in 2 week(s) with MD/APP    Signed, Shaquanda Graves Ninfa Meeker, PA-C  05/10/2022 4:25 PM    [facility]

## 2022-05-11 ENCOUNTER — Telehealth: Payer: Self-pay | Admitting: Cardiovascular Disease

## 2022-05-11 NOTE — Telephone Encounter (Signed)
Spoke with the patient and his daughter Jocelyn Lamer. Advised Jocelyn Lamer that [place] and Vania Rea are in the same class and are not taken together. Cadence did prescribe Wilder Glade not both and with f/u labs in 2 weeks. Alben Deeds that based on the patients insurance we will interchange [place] or Jardiance depending on which one will be covered. Robby Sermon that the patient cannot afford Wilder Glade and also that she researched the medication and concerned about the side effects and it dropping her Dad's blood sugars to low.  They are wanting to discuss this with Dr. Rockey Situ only. I advised her that it is not in our protocol for the MD to call to discuss her concern, but we could certainly schedule an appt with him. Pt and Jocelyn Lamer were both satisfied with that. I discussed with my practice mgr Izora Gala and she okayed an appt with Dr. Rockey Situ on 05/25/22 @ 10: 20 am. Pt would rather wait and discuss any medication changes with Dr. Rockey Situ at the patients upcoming appt. Advised the patient that I will fwd the update to Cadence. Adv the pt to contact the office in the interim if assistance is needed.

## 2022-05-11 NOTE — Telephone Encounter (Signed)
Pt c/o medication issue:  1. Name of Medication:   dapagliflozin propanediol (FARXIGA) 10 MG TABS tablet  Jardiance   2. How are you currently taking this medication (dosage and times per day)? As written  3. Are you having a reaction (difficulty breathing--STAT)? no  4. What is your medication issue? Pt's daughter called and informed us that her father is supposed to start [place] today however it cost $138/month. She is also concerned because she stated that Cadence wants him to start Jardiance in 2 weeks, but the [place] are both diabetic medications and he has diabetes. She stated that she was concerned it will mess with his insulin levels. Pt would like a call by 12 noon today and she would like to speak to Dr. Rockey Situ personally if possible.

## 2022-05-16 ENCOUNTER — Other Ambulatory Visit: Payer: Self-pay | Admitting: Internal Medicine

## 2022-05-24 NOTE — Progress Notes (Unsigned)
Cardiology Office Note  Date:  05/25/2022   ID:  Joseph [name], DOB 1925/06/04, MRN [id number]  PCP:  Antony Madura, PA-C   Chief Complaint  Patient presents with   2 week follow up     Patient c/o left leg swelling, redness and warm to the touch. Medications reviewed by the patient.     HPI:  Joseph [name] is a 88 year old gentleman with a hx of  CAD, CABG in 1994,  PAD , followed by Dr.  Lucky Cowboy,  obesity,  hyperlipidemia,  arthritis of the knees,   fatigue, SOB, leg weakness,   lower extremity edema,  Carotid u/s  Less than 39% stenosis bilaterally AV block on beta-blockers Dilated cardiomyopathy ejection fraction 35 to 40% in June 2023 presenting for routine followup of his coronary artery disease and  chronic diastolic CHF.    Last seen in clinic by myself March 2023 Sedentary on that visit after loss of his wife 2022  Reports that he was seen in [facility] emergency room Apr 18, 2022 Increased SOB,  Blood pressure markedly elevated 190/70 Troponin 22, BNP 308 Was in normal sinus rhythm Ended up leaving AMA with follow-up in our office  Echo 4/22 Ef 45 to 50%  Echo 6/23 EF 35-40% Was seen in our office for follow-up It is recommended that he start Farxiga,  Instructed to take torsemide 20 daily  He is inclined not to proceed with cardiac catheterization  prefers medical management Prior catheterization 2021 with severe CAD, medical management recommended  EKG personally reviewed by myself on todays visit Normal sinus rhythm rate 67 bpm, PACs, interventricular conduction delay  Other past medical history reviewed  Spends much of his time sedentary, sitting, TV Lost wife beginning of the year 2022  Labs reviewed A1c 7.8 Total chol 115, LDL 57  -cath 12/17/2019  Severe native CAD, including 80% distal LMCA, 100% proximal LAD and diffuse LCx/OM disease.  90% mid RCA stenosis and CTO of rPDA also present. Patent LIMA->D3 with backfilling of the mid/distal  LADl. Patent SVG-D1-D2, SVG-OM1-OM2, and SVG-rPDA-rPL. Mildly to moderately elevated LVEDP. At least moderately reduced left ventricular systolic function.  Echo 12/17/2019  1. Left ventricular ejection fraction, by visual estimation, is 50 to  55%. The left ventricle has low normal function. There is mildly increased  left ventricular hypertrophy.  Other past medical history reviewed  headache 02/03/2017,  Seen in th ER for H/A 02/03/17 Confused,  In the emergency room had CT head, Showing atherosclerotic calcification of the cavernous carotid arteries bilaterally. Lab work within normal limits No other focal neurologic deficits Patient and family thinks he had a TIA  admission May 2017 for acute cholecystitis, developing acute respiratory distress with hypoxia and acute renal disease, peak creatinine of 2. Notes indicating HIDA scan positive for acute cholecystitis. s/p cholecystostomy tube placed in through interventional radiology 03/24/16 Diuretic was discontinued in the setting of acute renal failure He does report greater than 20 pound weight loss   Echocardiogram 03/22/2016 reviewed with him, ejection fraction 50-55% Chest x-ray 03/29/2016 with small right pleural effusion   Ultrasound of the legs showing patent right SFA stent, left side CFA and popliteal artery with good flow, waveform deterioration around the ankle on the left   Last Cardiac catheterization was performed at [facility] 08/16/2012 Catheterization showed severe three-vessel coronary artery disease with patent grafts x4. There was 80% disease of a small to moderate sized distal left circumflex. Ejection fraction 45-50%. No significant aortic valve stenosis or mitral  valve regurgitation. The findings were discussed with interventional cardiology and medical management was recommended for the distal left circumflex disease.    previous Echocardiogram did not show elevated right-sided pressures and ejection fraction was  normal.  Previous episode of tachycardia in June 2013 that resolved without intervention   PMH:   has a past medical history of Acute respiratory failure with hypoxia ([place]) (03/21/2016), CAD (coronary artery disease), Chronic diastolic CHF (congestive heart failure) ([place]), Diabetes mellitus, type 2 ([place]), HTN (hypertension), Hyperlipidemia, Hypothyroidism, Lower extremity edema, and Spinal stenosis.  PSH:    Past Surgical History:  Procedure Laterality Date   APPENDECTOMY     CARDIAC CATHETERIZATION  07/2012   [facility]/no stents   CHOLECYSTECTOMY     CORNEAL TRANSPLANT     CORONARY ARTERY BYPASS GRAFT  1994   HEMORRHOID SURGERY     LEFT HEART CATH AND CORS/GRAFTS ANGIOGRAPHY N/A 12/17/2019   Procedure: LEFT HEART CATH AND CORS/GRAFTS ANGIOGRAPHY;  Surgeon: Nelva Bush, MD;  Location: [facility];  Service: Cardiovascular;  Laterality: N/A;   PROSTATE BIOPSY     TOE SURGERY     ingrown toe nail   Current Outpatient Medications on File Prior to Visit  Medication Sig Dispense Refill   atorvastatin (LIPITOR) 80 MG tablet Take 1 tablet (80 mg total) by mouth daily. 90 tablet 1   Cholecalciferol 125 MCG (5000 UT) capsule Take 5,000 Units by mouth at bedtime.     clobetasol cream (TEMOVATE) 0.10 % Apply 1 application topically 2 (two) times daily. As needed to hands and elbows     clopidogrel (PLAVIX) 75 MG tablet Take 1 tablet (75 mg total) by mouth daily. 90 tablet 3   Continuous Blood Gluc Receiver (FREESTYLE LIBRE 2 READER) DEVI Use 1 Device as directed     Continuous Blood Gluc Sensor (FREESTYLE LIBRE 14 DAY SENSOR) MISC USE AS DIRECTED EVERY 14 DAYS 2 each 0   hydrocortisone 2.5 % cream APPLY TOPICALLY TWICE DAILY AS NEEDED TO FACE, CREASE, GROIN, BUTT. 28 g 0   insulin aspart (NOVOLOG) 100 UNIT/ML injection Take from 14 - 34 units before meals, as directed. Max daily dose is 90 units.     insulin glargine (LANTUS) 100 UNIT/ML injection Inject 40 Units into the skin daily.      ketoconazole (NIZORAL) 2 % cream APPLY TOPICALLY TWICE DAILY AS NEEDED FOR IRRITATION TO GROIN, FACE, AND BUTTOCKS 15 g 0   levothyroxine (SYNTHROID) 50 MCG tablet TAKE 1 TABLET BY MOUTH ONCE DAILY BEFORE BREAKFAST 90 tablet 0   losartan (COZAAR) 100 MG tablet Take 1 tablet (100 mg total) by mouth daily. 90 tablet 3   Magnesium 250 MG TABS Take 250 mg by mouth as needed (swelling). Take when you take your Torsemide.     mometasone (ELOCON) 0.1 % ointment Apply topically daily. As needed to scalp and ears     nitroGLYCERIN (NITROSTAT) 0.4 MG SL tablet Place 1 tablet (0.4 mg total) under the tongue every 5 (five) minutes as needed for chest pain. 25 tablet 3   omeprazole (PRILOSEC) 20 MG capsule Take 1 capsule by mouth once daily 90 capsule 3   potassium chloride (KLOR-CON M) 10 MEQ tablet Take 1 tablet by mouth once daily 30 tablet 0   torsemide (DEMADEX) 20 MG tablet Take 1 tablet (20 mg total) by mouth daily. 90 tablet 3   No current facility-administered medications on file prior to visit.    Allergies:   Patient has no known  allergies.   Social History:  The patient  reports that he quit smoking about 41 years ago. His smoking use included pipe, cigars, and cigarettes. He has never used smokeless tobacco. He reports that he does not drink alcohol and does not use drugs.   Family History:   family history includes Heart attack in his father; Heart failure in his maternal grandfather, maternal uncle, and mother; Kidney Stones in his son; Sleep apnea in his daughter and son; Stroke in his brother.    Review of Systems: Review of Systems  Constitutional: Negative.        Gait instability  HENT: Negative.    Respiratory: Negative.    Cardiovascular: Negative.   Gastrointestinal: Negative.   Musculoskeletal: Negative.   Neurological: Negative.   Psychiatric/Behavioral: Negative.    All other systems reviewed and are negative.   PHYSICAL EXAM: VS:  BP 120/68 (BP Location: Left Arm,  Patient Position: Sitting, Cuff Size: Normal)   Pulse 67   Ht '5\' 7"'$  (1.702 m)   Wt 214 lb 8 oz (97.3 kg)   SpO2 96%   BMI 33.60 kg/m  , BMI Body mass index is 33.6 kg/m. Constitutional:  oriented to person, place, and time. No distress.  HENT:  Head: Grossly normal Eyes:  no discharge. No scleral icterus.  Neck: No JVD, no carotid bruits  Cardiovascular: Regular rate and rhythm, no murmurs appreciated Trace pitting lower extremity edema left leg below the knee Pulmonary/Chest: Clear to auscultation bilaterally, no wheezes or rails Abdominal: Soft.  no distension.  no tenderness.  Musculoskeletal: Normal range of motion Neurological:  normal muscle tone. Coordination normal. No atrophy Skin: Skin warm and dry Psychiatric: normal affect, pleasant   Recent Labs: 04/28/2022: Magnesium 1.8 05/10/2022: BUN 18; Creatinine, Ser 1.19; Potassium 4.4; Sodium 140    Lipid Panel Lab Results  Component Value Date   CHOL 121 12/17/2019   HDL 34 (L) 12/17/2019   LDLCALC 67 12/17/2019   TRIG 98 12/17/2019      Wt Readings from Last 3 Encounters:  05/25/22 214 lb 8 oz (97.3 kg)  05/10/22 213 lb (96.6 kg)  04/20/22 210 lb (95.3 kg)     ASSESSMENT AND PLAN:   Depression/fatigue decline since loss of his wife last year Sedentary Good supports from family including daughter  Ischemic cardiomyopathy Prior catheterization 2001 with severe disease, medical management recommended He is inclined not to proceed with cardiac catheterization at this time Unable to afford Farxiga Ejection fraction 35% down from 45% last year He is willing to pursue medical management Recommend he start spironolactone 25 daily, stop potassium at that time Continue losartan Would likely need patient assistance for additional medication such as a change from losartan to [facility] or addition of Jardiance/Farxiga Daughter concerned about potential side effects from [place], she will talk to  endocrinology  Mixed hyperlipidemia -  Cholesterol is at goal on the current lipid regimen. No changes to the medications were made.  HYPERTENSION, BENIGN -  Recommend he start spironolactone 25 mg daily, stop potassium  Atrial tachycardia Beta-blocker held for AV block No recent symptoms of tachycardia  Atherosclerosis of coronary artery bypass graft of native heart with stable angina pectoris [facility]) -  Catheterization 2021 detailing patent grafts Currently with no symptoms of angina. No further workup at this time. Continue current medication regimen.  Diabetes mellitus with peripheral vascular disease (HCC) -  A1c 7.8 Weight running high  Diastolic CHF, acute on chronic (HCC) -  Euvolemic, recommend he  stay on torsemide 20 daily  SOB (shortness of breath) -  Secondary to fluid, deconditioning, obesity  ischemic cardiomyopathy noted ejection fraction 35% He is selected medical management, declining catheterization Start spironolactone as above, beta-blocker on hold for AV block/bradycardia On losartan In follow-up could consider transitioning to Entresto off losartan with patient assistance  Morbid obesity due to excess calories ([place]) -  We have encouraged continued exercise, careful diet management in an effort to lose weight.  Leg edema Left greater than right, chronic issue Continue torsemide daily Recommend compression hose, again discussed with him  PAD (peripheral artery disease) ([place]) -  Cholesterol at goal, A1c high Minimally active,  Low carbohydrate diet recommended   Total encounter time more than 40 minutes  Greater than 50% was spent in counseling and coordination of care with the patient    No orders of the defined types were placed in this encounter.    Signed, Esmond Plants, M.D., Ph.D. 05/25/2022  [place], [place]

## 2022-05-25 ENCOUNTER — Telehealth: Payer: Self-pay | Admitting: *Deleted

## 2022-05-25 ENCOUNTER — Encounter: Payer: Self-pay | Admitting: Cardiovascular Disease

## 2022-05-25 ENCOUNTER — Ambulatory Visit: Payer: HMO | Admitting: Cardiovascular Disease

## 2022-05-25 VITALS — BP 120/68 | HR 67 | Ht 67.0 in | Wt 214.5 lb

## 2022-05-25 DIAGNOSIS — I2510 Atherosclerotic heart disease of native coronary artery without angina pectoris: Secondary | ICD-10-CM | POA: Diagnosis not present

## 2022-05-25 DIAGNOSIS — R0609 Other forms of dyspnea: Secondary | ICD-10-CM | POA: Diagnosis not present

## 2022-05-25 DIAGNOSIS — I429 Cardiomyopathy, unspecified: Secondary | ICD-10-CM | POA: Diagnosis not present

## 2022-05-25 DIAGNOSIS — I152 Hypertension secondary to endocrine disorders: Secondary | ICD-10-CM

## 2022-05-25 DIAGNOSIS — Z951 Presence of aortocoronary bypass graft: Secondary | ICD-10-CM

## 2022-05-25 DIAGNOSIS — I10 Essential (primary) hypertension: Secondary | ICD-10-CM | POA: Diagnosis not present

## 2022-05-25 DIAGNOSIS — E1159 Type 2 diabetes mellitus with other circulatory complications: Secondary | ICD-10-CM | POA: Diagnosis not present

## 2022-05-25 DIAGNOSIS — I5032 Chronic diastolic (congestive) heart failure: Secondary | ICD-10-CM | POA: Diagnosis not present

## 2022-05-25 DIAGNOSIS — I5022 Chronic systolic (congestive) heart failure: Secondary | ICD-10-CM

## 2022-05-25 MED ORDER — SPIRONOLACTONE 25 MG PO TABS: 25.0000 mg | ORAL_TABLET | Freq: Every day | ORAL | 3 refills | Status: DC

## 2022-05-25 MED ORDER — CARVEDILOL 3.125 MG PO TABS: 3.1250 mg | ORAL_TABLET | Freq: Two times a day (BID) | ORAL | 3 refills | Status: DC

## 2022-05-25 NOTE — Patient Instructions (Addendum)
Medication Instructions:  Start spironolactone 25 mg daily, stop the potassium  Stay on torsemide daily Take extra torsemide as needed for shortness of breath  If you need a refill on your cardiac medications before your next appointment, please call your pharmacy.   Lab work: Your physician recommends that you return for lab work (BMP) in: 1 month  - Please go to the [facility]. You will check in at the front desk to the right as you walk into the atrium. Valet Parking is offered if needed. - No appointment needed. You may go any day between 7 am and 6 pm.    Testing/Procedures: No new testing needed  Follow-Up: At [facility], you and your health needs are our priority.  As part of our continuing mission to provide you with exceptional heart care, we have created designated Provider Care Teams.  These Care Teams include your primary Cardiologist (physician) and Advanced Practice Providers (APPs -  Physician Assistants and Nurse Practitioners) who all work together to provide you with the care you need, when you need it.  You will need a follow up appointment in 3 months  Providers on your designated Care Team:   Murray Hodgkins, NP Christell Faith, PA-C Cadence Kathlen Mody, [place]  COVID-19 Vaccine Information can be found at: ShippingScam.co.uk For questions related to vaccine distribution or appointments, please email [email or web address] .[email or web address] or call [phone].  , stop the potassium

## 2022-05-25 NOTE — Telephone Encounter (Signed)
Called and spoke to pt and pt's daughter (DPR). Pt seen in clinic today with Dr. Rockey Situ. Called pt and clarified plan moving forward per Dr. Rockey Situ. Pt will only start spironolactone beginning tomorrow. Repeat labs in 2 weeks (not 1 month). Pt daughter voiced understanding and understands lab instructions.

## 2022-05-27 ENCOUNTER — Ambulatory Visit: Payer: HMO | Admitting: Medical

## 2022-06-01 ENCOUNTER — Encounter: Payer: Self-pay | Admitting: Family Medicine

## 2022-06-01 ENCOUNTER — Ambulatory Visit (INDEPENDENT_AMBULATORY_CARE_PROVIDER_SITE_OTHER): Payer: HMO | Admitting: Family Medicine

## 2022-06-01 VITALS — BP 132/66 | HR 55 | Resp 16 | Wt 213.0 lb

## 2022-06-01 DIAGNOSIS — G8929 Other chronic pain: Secondary | ICD-10-CM | POA: Diagnosis not present

## 2022-06-01 DIAGNOSIS — I5032 Chronic diastolic (congestive) heart failure: Secondary | ICD-10-CM

## 2022-06-01 DIAGNOSIS — M545 Low back pain, unspecified: Secondary | ICD-10-CM | POA: Diagnosis not present

## 2022-06-01 DIAGNOSIS — E1151 Type 2 diabetes mellitus with diabetic peripheral angiopathy without gangrene: Secondary | ICD-10-CM | POA: Diagnosis not present

## 2022-06-01 DIAGNOSIS — I10 Essential (primary) hypertension: Secondary | ICD-10-CM

## 2022-06-01 DIAGNOSIS — Z794 Long term (current) use of insulin: Secondary | ICD-10-CM | POA: Diagnosis not present

## 2022-06-01 DIAGNOSIS — I2510 Atherosclerotic heart disease of native coronary artery without angina pectoris: Secondary | ICD-10-CM | POA: Diagnosis not present

## 2022-06-01 MED ORDER — KETOROLAC TROMETHAMINE 30 MG/ML IJ SOLN: 30.0000 mg | Freq: Once | INTRAMUSCULAR | Status: DC

## 2022-06-01 MED ORDER — KETOROLAC TROMETHAMINE 60 MG/2ML IM SOLN
60.0000 mg | Freq: Once | INTRAMUSCULAR | Status: AC
  Administered 2022-06-01: 60 mg via INTRAMUSCULAR

## 2022-06-01 NOTE — Progress Notes (Signed)
Established patient visit  I,April Miller,acting as a scribe for Wilhemena Durie, MD.,have documented all relevant documentation on the behalf of Wilhemena Durie, MD,as directed by  Wilhemena Durie, MD while in the presence of Wilhemena Durie, MD.   Patient: Joseph [name]   DOB: Oct 02, 1925   88 y.o. Male  MRN: [id number] Visit Date: 06/01/2022  Today's healthcare provider: Wilhemena Durie, MD   Chief Complaint  Patient presents with   Follow-up   Hypertension   Subjective    HPI  Patient comes in today for follow-up.  He continues to live alone.  He has some mild right lower back pain after suffering a fall on July 4.  He was [place] the [place] and fell.  No severe pain, just soreness in his right lower back.  He is able to ambulate  Hypertension, follow-up  BP Readings from Last 3 Encounters:  06/01/22 132/66  05/25/22 120/68  05/10/22 126/60   Wt Readings from Last 3 Encounters:  06/01/22 213 lb (96.6 kg)  05/25/22 214 lb 8 oz (97.3 kg)  05/10/22 213 lb (96.6 kg)     He was last seen for hypertension 4 months ago.  Management since that visit includes; Control in 88 year old gentleman..  Outside blood pressures are NORMAL.  Pertinent labs Lab Results  Component Value Date   CHOL 121 12/17/2019   HDL 34 (L) 12/17/2019   LDLCALC 67 12/17/2019   TRIG 98 12/17/2019   CHOLHDL 3.6 12/17/2019   Lab Results  Component Value Date   NA 140 05/10/2022   K 4.4 05/10/2022   CREATININE 1.19 05/10/2022   GFRNONAA 57 (L) 05/10/2022   GLUCOSE 152 (H) 05/10/2022   TSH 2.220 05/11/2020     The ASCVD Risk score (Arnett DK, et al., 2019) failed to calculate for the following reasons:   The 2019 ASCVD risk score is only valid for ages 66 to 44   The patient has a prior MI or stroke diagnosis  ---------------------------------------------------------------------------------------------------   Medications: Outpatient Medications Prior to Visit   Medication Sig   atorvastatin (LIPITOR) 80 MG tablet Take 1 tablet (80 mg total) by mouth daily.   Cholecalciferol 125 MCG (5000 UT) capsule Take 5,000 Units by mouth at bedtime.   clobetasol cream (TEMOVATE) 4.08 % Apply 1 application topically 2 (two) times daily. As needed to hands and elbows   clopidogrel (PLAVIX) 75 MG tablet Take 1 tablet (75 mg total) by mouth daily.   Continuous Blood Gluc Receiver (FREESTYLE LIBRE 2 READER) DEVI Use 1 Device as directed   Continuous Blood Gluc Sensor (FREESTYLE LIBRE 14 DAY SENSOR) MISC USE AS DIRECTED EVERY 14 DAYS   hydrocortisone 2.5 % cream APPLY TOPICALLY TWICE DAILY AS NEEDED TO FACE, CREASE, GROIN, BUTT.   insulin aspart (NOVOLOG) 100 UNIT/ML injection Take from 14 - 34 units before meals, as directed. Max daily dose is 90 units.   insulin glargine (LANTUS) 100 UNIT/ML injection Inject 40 Units into the skin daily.   ketoconazole (NIZORAL) 2 % cream APPLY TOPICALLY TWICE DAILY AS NEEDED FOR IRRITATION TO GROIN, FACE, AND BUTTOCKS   levothyroxine (SYNTHROID) 50 MCG tablet TAKE 1 TABLET BY MOUTH ONCE DAILY BEFORE BREAKFAST   losartan (COZAAR) 100 MG tablet Take 1 tablet (100 mg total) by mouth daily.   Magnesium 250 MG TABS Take 250 mg by mouth as needed (swelling). Take when you take your Torsemide.   nitroGLYCERIN (NITROSTAT) 0.4 MG SL tablet Place  1 tablet (0.4 mg total) under the tongue every 5 (five) minutes as needed for chest pain.   omeprazole (PRILOSEC) 20 MG capsule Take 1 capsule by mouth once daily   [START ON 06/08/2022] spironolactone (ALDACTONE) 25 MG tablet Take 1 tablet (25 mg total) by mouth daily.   torsemide (DEMADEX) 20 MG tablet Take 1 tablet (20 mg total) by mouth daily.   [DISCONTINUED] mometasone (ELOCON) 0.1 % ointment Apply topically daily. As needed to scalp and ears   No facility-administered medications prior to visit.    Review of Systems  Constitutional:  Negative for appetite change, chills and fever.   Respiratory:  Negative for chest tightness, shortness of breath and wheezing.   Cardiovascular:  Negative for chest pain and palpitations.  Gastrointestinal:  Negative for abdominal pain, nausea and vomiting.    Last metabolic panel Lab Results  Component Value Date   GLUCOSE 152 (H) 05/10/2022   NA 140 05/10/2022   K 4.4 05/10/2022   CL 104 05/10/2022   CO2 28 05/10/2022   BUN 18 05/10/2022   CREATININE 1.19 05/10/2022   GFRNONAA 57 (L) 05/10/2022   CALCIUM 9.2 05/10/2022   PROT 6.8 12/16/2019   ALBUMIN 3.4 (L) 12/16/2019   LABGLOB 2.7 05/14/2018   AGRATIO 1.2 05/14/2018   BILITOT 0.6 12/16/2019   ALKPHOS 47 12/16/2019   AST 24 12/16/2019   ALT 21 12/16/2019   ANIONGAP 8 05/10/2022       Objective    BP 132/66 (BP Location: Left Arm, Patient Position: Sitting, Cuff Size: Large)   Pulse (!) 55   Resp 16   Wt 213 lb (96.6 kg)   SpO2 98%   BMI 33.36 kg/m  BP Readings from Last 3 Encounters:  06/01/22 132/66  05/25/22 120/68  05/10/22 126/60   Wt Readings from Last 3 Encounters:  06/01/22 213 lb (96.6 kg)  05/25/22 214 lb 8 oz (97.3 kg)  05/10/22 213 lb (96.6 kg)      Physical Exam Vitals reviewed.  Constitutional:      Appearance: He is obese.  HENT:     Head: Normocephalic and atraumatic.     Right Ear: External ear normal.     Left Ear: External ear normal.     Mouth/Throat:     Pharynx: Oropharynx is clear.  Cardiovascular:     Rate and Rhythm: Normal rate and regular rhythm.     Heart sounds: Normal heart sounds.  Pulmonary:     Breath sounds: Normal breath sounds.  Abdominal:     General: Bowel sounds are normal.     Palpations: Abdomen is soft.     Tenderness: There is no abdominal tenderness.  Musculoskeletal:     Right lower leg: Edema present.     Left lower leg: Edema present.     Comments: 1+ lower extremity edema Is no tenderness along his lower spine has a normal MSK  exam  Skin:    General: Skin is warm and dry.     Comments:  Venous stasis changes of the skin of lower extremities  Neurological:     General: No focal deficit present.     Mental Status: He is alert and oriented to person, place, and time.  Psychiatric:        Mood and Affect: Mood normal.        Behavior: Behavior normal.        Thought Content: Thought content normal.        Judgment: Judgment  normal.       Results for orders placed or performed in visit on 06/01/22  Hemoglobin A1c  Result Value Ref Range   Hemoglobin A1C 7.8     Assessment & Plan     1. Essential hypertension  - Renal function panel  2. Chronic bilateral low back pain without sciatica  - ketorolac (TORADOL) injection 60 mg  recent fall aggravated chronic musculoskeletal pain.  There is no indication of injury other than bruising. Toradol and see if this gives him some relief 3. Chronic diastolic heart failure (HCC)  - Renal function panel  4. Type 2 diabetes mellitus with diabetic peripheral angiopathy without gangrene, with long-term current use of insulin (HCC) Goal A1c less than 9.  A1c today is 7.8. - Renal function panel  5. Coronary artery disease involving native coronary artery of native heart without angina pectoris All risk factors treated - Renal function panel   Return in about 4 months (around 10/02/2022).      I, Wilhemena Durie, MD, have reviewed all documentation for this visit. The documentation on 06/03/22 for the exam, diagnosis, procedures, and orders are all accurate and complete.    Bralon Antkowiak Cranford Mon, MD  [facility] [phone] (phone) [phone] (fax)  [place]

## 2022-06-02 LAB — RENAL FUNCTION PANEL
Albumin: 3.9 g/dL (ref 3.6–4.6)
BUN/Creatinine Ratio: 17 (ref 10–24)
BUN: 20 mg/dL (ref 10–36)
CO2: 24 mmol/L (ref 20–29)
Calcium: 9.3 mg/dL (ref 8.6–10.2)
Chloride: 100 mmol/L (ref 96–106)
Creatinine, Ser: 1.21 mg/dL (ref 0.76–1.27)
Glucose: 191 mg/dL — ABNORMAL HIGH (ref 70–99)
Phosphorus: 3.4 mg/dL (ref 2.8–4.1)
Potassium: 4.7 mmol/L (ref 3.5–5.2)
Sodium: 139 mmol/L (ref 134–144)
eGFR: 55 mL/min/{1.73_m2} — ABNORMAL LOW (ref >59)

## 2022-06-06 ENCOUNTER — Ambulatory Visit: Payer: Self-pay | Admitting: *Deleted

## 2022-06-06 NOTE — Telephone Encounter (Addendum)
[employer] RN called to report pt still in pain. Called pt for details Hip pain radiates down leg, pain level 9 when walking. Saw Dr. Rosanna Randy Friday. He wants a cortisone shot in his hip. He also would like a rx for pain med called in until he can get a injection.  Chief Complaint: hip pain Symptoms: hip pain Frequency: constant, worse when walks Pertinent Negatives: Patient denies injury Disposition: '[]'$ ED /'[]'$ Urgent Care (no appt availability in office) / '[]'$ Appointment(In office/virtual)/ '[]'$  [place] [facility]/ '[]'$ Home Care/ '[]'$ Refused Recommended Disposition /'[]'$ [place] [facility]/ '[x]'$  Follow-up with PCP Additional Notes: Referral wanted  [employer] RN called to report pt still in pain. Called pt for details Hip pain radiates down leg, pain level 9 when walking. Saw Dr. Rosanna Randy Friday. He wants a cortisone shot in his hip. He also would like a rx for pain med called in until he can get a injection.

## 2022-06-06 NOTE — Telephone Encounter (Signed)
Patient was advised. He said it would be in the morning before he can get a ride.

## 2022-06-06 NOTE — Telephone Encounter (Signed)
Reason for Disposition . Hip pain  Protocols used: Hip Pain-A-AH

## 2022-06-06 NOTE — Telephone Encounter (Signed)
May be working him with [place] or Mendel Ryder or probably better to send him to [facility] urgent care once he has a ride.  We gave him a Toradol shot last week  which evidently did not help.

## 2022-06-08 ENCOUNTER — Other Ambulatory Visit
Admission: RE | Admit: 2022-06-08 | Discharge: 2022-06-08 | Disposition: A | Discharge status: Home / Self Care | Payer: HMO | Attending: Cardiovascular Disease | Admitting: Cardiovascular Disease

## 2022-06-08 ENCOUNTER — Telehealth: Payer: Self-pay | Admitting: Emergency Medicine

## 2022-06-08 DIAGNOSIS — I5022 Chronic systolic (congestive) heart failure: Secondary | ICD-10-CM | POA: Diagnosis not present

## 2022-06-08 LAB — BASIC METABOLIC PANEL
Anion gap: 7 (ref 5–15)
BUN: 24 mg/dL — ABNORMAL HIGH (ref 8–23)
CO2: 26 mmol/L (ref 22–32)
Calcium: 9 mg/dL (ref 8.9–10.3)
Chloride: 103 mmol/L (ref 98–111)
Creatinine, Ser: 1.4 mg/dL — ABNORMAL HIGH (ref 0.61–1.24)
GFR, Estimated: 47 mL/min — ABNORMAL LOW (ref >60)
Glucose, Bld: 141 mg/dL — ABNORMAL HIGH (ref 70–99)
Potassium: 4.4 mmol/L (ref 3.5–5.1)
Sodium: 136 mmol/L (ref 135–145)

## 2022-06-08 NOTE — Telephone Encounter (Signed)
Called patient, went over results and recommendations. Pt verbalized understanding, and questions, if any, were answered.  ?

## 2022-06-08 NOTE — Telephone Encounter (Signed)
-----   Message from Minna Merritts, MD sent at 06/08/2022  2:20 PM EDT ----- Lab work reviewed Stable electrolytes Renal function high end of range, looking little bit dehydrated.   May need to make slight increase to fluid intake

## 2022-06-09 ENCOUNTER — Ambulatory Visit: Payer: HMO | Admitting: Family Medicine

## 2022-06-09 DIAGNOSIS — E119 Type 2 diabetes mellitus without complications: Secondary | ICD-10-CM | POA: Diagnosis not present

## 2022-06-09 DIAGNOSIS — M7061 Trochanteric bursitis, right hip: Secondary | ICD-10-CM | POA: Diagnosis not present

## 2022-06-09 DIAGNOSIS — M47896 Other spondylosis, lumbar region: Secondary | ICD-10-CM | POA: Diagnosis not present

## 2022-06-09 DIAGNOSIS — M533 Sacrococcygeal disorders, not elsewhere classified: Secondary | ICD-10-CM | POA: Diagnosis not present

## 2022-06-10 DIAGNOSIS — E119 Type 2 diabetes mellitus without complications: Secondary | ICD-10-CM | POA: Insufficient documentation

## 2022-07-27 LAB — HEMOGLOBIN A1C: Hemoglobin A1C: 8.4

## 2022-07-28 ENCOUNTER — Telehealth: Payer: Self-pay

## 2022-07-28 NOTE — Telephone Encounter (Signed)
Copied from [place] [phone]. Topic: Appointment Scheduling - Scheduling Inquiry for Clinic >> Jul 28, 2022  2:54 PM Tiffany B wrote: Reason for CRM: Patient would like a follow up appointment with PCP prior to PCP leaving.

## 2022-07-29 NOTE — Telephone Encounter (Signed)
Please advise - appt?

## 2022-08-04 NOTE — Telephone Encounter (Signed)
Spoke with patient. He said he is not happy about you leaving and just wants to talk. I advised you did not have any openings and he asked if you could call him. I said I could not make any promises, but I would send a message.

## 2022-08-08 ENCOUNTER — Ambulatory Visit: Payer: HMO | Admitting: Cardiovascular Disease

## 2022-08-09 ENCOUNTER — Other Ambulatory Visit: Payer: Self-pay | Admitting: Family Medicine

## 2022-08-10 NOTE — Telephone Encounter (Signed)
Requested Prescriptions  Pending Prescriptions Disp Refills  . omeprazole (PRILOSEC) 20 MG capsule [Pharmacy Med Name: Omeprazole 20 MG Oral Capsule Delayed Release] 90 capsule 3    Sig: Take 1 capsule by mouth once daily     Gastroenterology: Proton Pump Inhibitors Passed - 08/09/2022 12:43 PM      Passed - Valid encounter within last 12 months    Recent Outpatient Visits          2 months ago Essential hypertension   [facility] [name]., MD   6 months ago Atherosclerosis of coronary artery bypass graft of native heart with stable angina pectoris [facility])   [facility] [name]., MD   7 months ago Acute non-recurrent frontal sinusitis   [facility] Tally Joe T, FNP   10 months ago Generalized abdominal pain   [facility] [name]., MD   11 months ago Essential hypertension   [facility] [name]., MD      Future Appointments            In 2 weeks Gollan, Kathlene November, MD [place]. [place]   In 1 month [name]., MD [facility], [place]

## 2022-08-27 NOTE — Progress Notes (Unsigned)
Cardiology Office Note  Date:  08/29/2022   ID:  Joseph [name], DOB 10-19-1925, MRN [id number]  PCP:  Antony Madura, PA-C   Chief Complaint  Patient presents with   Other    3 month f/u no complaints today . Meds reviewed verbally with pt.    HPI:  [name] is a 88 year old gentleman with a hx of  CAD, CABG in 1994,  PAD , followed by Dr.  Lucky Cowboy,  obesity,  hyperlipidemia,  arthritis of the knees,   fatigue, SOB, leg weakness,   lower extremity edema,  Carotid u/s  Less than 39% stenosis bilaterally AV block on beta-blockers Dilated cardiomyopathy ejection fraction 35 to 40% in June 2023 presenting for routine followup of his coronary artery disease and  chronic diastolic CHF.    Last seen in clinic by myself May 25, 2022 In follow-up today, he presents with family  he reports that he feels well  Sugars running high Followed by Dr. Gabriel Carina, Dr. Rosanna Randy  No swelling in his legs, denies significant chest pain or shortness of breath on exertion Continues to take potassium for leg cramps Previously instructed to hold potassium when we spironolactone Bothered by polyuria on torsemide  loss of his wife 2022  Echo 4/22 Ef 45 to 50%  Echo 6/23 EF 35-40% Previously declined cardiac catheterization  EKG personally reviewed by myself on todays visit Normal sinus rhythm rate 59 bpm, PACs interventricular conduction delay  Past medical history reviewed seen in [facility] emergency room Apr 18, 2022 Increased SOB,  Blood pressure markedly elevated 190/70 Troponin 22, BNP 308 Was in normal sinus rhythm Ended up leaving AMA with follow-up in our office  Other past medical history reviewed -cath 12/17/2019  Severe native CAD, including 80% distal LMCA, 100% proximal LAD and diffuse LCx/OM disease.  90% mid RCA stenosis and CTO of rPDA also present. Patent LIMA->D3 with backfilling of the mid/distal LADl. Patent SVG-D1-D2, SVG-OM1-OM2, and SVG-rPDA-rPL. Mildly to  moderately elevated LVEDP. At least moderately reduced left ventricular systolic function.  Echo 12/17/2019  1. Left ventricular ejection fraction, by visual estimation, is 50 to  55%. The left ventricle has low normal function. There is mildly increased  left ventricular hypertrophy.  Other past medical history reviewed  headache 02/03/2017,  Seen in th ER for H/A 02/03/17 Confused,  In the emergency room had CT head, Showing atherosclerotic calcification of the cavernous carotid arteries bilaterally. Lab work within normal limits No other focal neurologic deficits Patient and family thinks he had a TIA  admission May 2017 for acute cholecystitis, developing acute respiratory distress with hypoxia and acute renal disease, peak creatinine of 2. Notes indicating HIDA scan positive for acute cholecystitis. s/p cholecystostomy tube placed in through interventional radiology 03/24/16 Diuretic was discontinued in the setting of acute renal failure He does report greater than 20 pound weight loss   Echocardiogram 03/22/2016 reviewed with him, ejection fraction 50-55% Chest x-ray 03/29/2016 with small right pleural effusion   Ultrasound of the legs showing patent right SFA stent, left side CFA and popliteal artery with good flow, waveform deterioration around the ankle on the left   Last Cardiac catheterization was performed at [facility] 08/16/2012 Catheterization showed severe three-vessel coronary artery disease with patent grafts x4. There was 80% disease of a small to moderate sized distal left circumflex. Ejection fraction 45-50%. No significant aortic valve stenosis or mitral valve regurgitation. The findings were discussed with interventional cardiology and medical management was recommended for the distal left circumflex  disease.   PMH:   has a past medical history of Acute respiratory failure with hypoxia ([place]) (03/21/2016), CAD (coronary artery disease), Chronic diastolic CHF (congestive heart  failure) ([place]), Diabetes mellitus, type 2 ([place]), HTN (hypertension), Hyperlipidemia, Hypothyroidism, Lower extremity edema, and Spinal stenosis.  PSH:    Past Surgical History:  Procedure Laterality Date   APPENDECTOMY     CARDIAC CATHETERIZATION  07/2012   [facility]/no stents   CHOLECYSTECTOMY     CORNEAL TRANSPLANT     CORONARY ARTERY BYPASS GRAFT  1994   HEMORRHOID SURGERY     LEFT HEART CATH AND CORS/GRAFTS ANGIOGRAPHY N/A 12/17/2019   Procedure: LEFT HEART CATH AND CORS/GRAFTS ANGIOGRAPHY;  Surgeon: Nelva Bush, MD;  Location: [place] [facility];  Service: Cardiovascular;  Laterality: N/A;   PROSTATE BIOPSY     TOE SURGERY     ingrown toe nail   Current Outpatient Medications on File Prior to Visit  Medication Sig Dispense Refill   atorvastatin (LIPITOR) 80 MG tablet Take 1 tablet (80 mg total) by mouth daily. 90 tablet 1   Cholecalciferol 125 MCG (5000 UT) capsule Take 5,000 Units by mouth at bedtime.     clobetasol cream (TEMOVATE) 6.06 % Apply 1 application topically 2 (two) times daily. As needed to hands and elbows     clopidogrel (PLAVIX) 75 MG tablet Take 1 tablet (75 mg total) by mouth daily. 90 tablet 3   Continuous Blood Gluc Receiver (FREESTYLE LIBRE 2 READER) DEVI Use 1 Device as directed     Continuous Blood Gluc Sensor (FREESTYLE LIBRE 14 DAY SENSOR) MISC USE AS DIRECTED EVERY 14 DAYS 2 each 0   hydrocortisone 2.5 % cream APPLY TOPICALLY TWICE DAILY AS NEEDED TO FACE, CREASE, GROIN, BUTT. 28 g 0   insulin aspart (NOVOLOG) 100 UNIT/ML injection Take from 14 - 34 units before meals, as directed. Max daily dose is 90 units.     insulin glargine (LANTUS) 100 UNIT/ML injection Inject 40 Units into the skin daily.     ketoconazole (NIZORAL) 2 % cream APPLY TOPICALLY TWICE DAILY AS NEEDED FOR IRRITATION TO GROIN, FACE, AND BUTTOCKS 15 g 0   levothyroxine (SYNTHROID) 50 MCG tablet TAKE 1 TABLET BY MOUTH ONCE DAILY BEFORE BREAKFAST 90 tablet 0   losartan (COZAAR) 100 MG  tablet Take 1 tablet (100 mg total) by mouth daily. 90 tablet 3   Magnesium 250 MG TABS Take 250 mg by mouth as needed (swelling). Take when you take your Torsemide.     mometasone (ELOCON) 0.1 % lotion Apply topically daily.     nitroGLYCERIN (NITROSTAT) 0.4 MG SL tablet Place 1 tablet (0.4 mg total) under the tongue every 5 (five) minutes as needed for chest pain. 25 tablet 3   omeprazole (PRILOSEC) 20 MG capsule Take 1 capsule by mouth once daily 90 capsule 3   spironolactone (ALDACTONE) 25 MG tablet Take 1 tablet (25 mg total) by mouth daily. 90 tablet 3   torsemide (DEMADEX) 20 MG tablet Take 1 tablet (20 mg total) by mouth daily. 90 tablet 3   No current facility-administered medications on file prior to visit.     Allergies:   Patient has no known allergies.   Social History:  The patient  reports that he quit smoking about 41 years ago. His smoking use included pipe, cigars, and cigarettes. He has never used smokeless tobacco. He reports that he does not drink alcohol and does not use drugs.   Family History:   family  history includes Heart attack in his father; Heart failure in his maternal grandfather, maternal uncle, and mother; Kidney Stones in his son; Sleep apnea in his daughter and son; Stroke in his brother.    Review of Systems: Review of Systems  Constitutional: Negative.        Gait instability  HENT: Negative.    Respiratory: Negative.    Cardiovascular: Negative.   Gastrointestinal: Negative.   Musculoskeletal: Negative.   Neurological: Negative.   Psychiatric/Behavioral: Negative.    All other systems reviewed and are negative.  PHYSICAL EXAM: VS:  BP 120/60 (BP Location: Left Arm, Patient Position: Sitting, Cuff Size: Normal)   Pulse (!) 59   Ht '5\' 7"'$  (1.702 m)   Wt 216 lb 6 oz (98.1 kg)   SpO2 98%   BMI 33.89 kg/m  , BMI Body mass index is 33.89 kg/m. Constitutional:  oriented to person, place, and time. No distress.  HENT:  Head: Grossly  normal Eyes:  no discharge. No scleral icterus.  Neck: No JVD, no carotid bruits  Cardiovascular: Regular rate and rhythm, no murmurs appreciated Pulmonary/Chest: Clear to auscultation bilaterally, no wheezes or rails Abdominal: Soft.  no distension.  no tenderness.  Musculoskeletal: Normal range of motion Neurological:  normal muscle tone. Coordination normal. No atrophy Skin: Skin warm and dry Psychiatric: normal affect, pleasant  Recent Labs: 04/28/2022: Magnesium 1.8 06/08/2022: BUN 24; Creatinine, Ser 1.40; Potassium 4.4; Sodium 136    Lipid Panel Lab Results  Component Value Date   CHOL 121 12/17/2019   HDL 34 (L) 12/17/2019   LDLCALC 67 12/17/2019   TRIG 98 12/17/2019      Wt Readings from Last 3 Encounters:  08/29/22 216 lb 6 oz (98.1 kg)  06/01/22 213 lb (96.6 kg)  05/25/22 214 lb 8 oz (97.3 kg)     ASSESSMENT AND PLAN:   Depression/fatigue decline since loss of his wife last year Sedentary Good supports from family including daughter  Ischemic cardiomyopathy Prior catheterization 2001 with severe disease, medical management recommended Previously declined cardiac catheterization Tolerating losartan, spironolactone, torsemide Not on beta-blocker secondary to bradycardia Recommend he start [place].  If expensive will need patient assistance Similar issues with cost with Entresto Recommend he stop potassium as he is on spironolactone  Mixed hyperlipidemia -  Cholesterol is at goal on the current lipid regimen. No changes to the medications were made.  HYPERTENSION, BENIGN -  Blood pressure is well controlled on today's visit. No changes made to the medications.  Atrial tachycardia Beta-blocker held for AV block No recent symptoms of tachycardia  Atherosclerosis of coronary artery bypass graft of native heart with stable angina pectoris [facility]) -  Catheterization 2021 detailing patent grafts For cardiomyopathy, he has declined repeat  catheterization  Diabetes mellitus with peripheral vascular disease ([place]) -  A1c 8.4 Reports sugars consistently running 200 or higher Followed by endocrinology  Diastolic CHF, acute on chronic ([place]) -  Euvolemic,  Recent lab work reviewed Just he hold potassium, stay on spironolactone and torsemide  Morbid obesity due to excess calories ([place]) -  Calorie restriction recommended  Leg edema Left greater than right, chronic issue Continue torsemide daily Wearing compression hose on today's visit  PAD (peripheral artery disease) ([facility]) -  Cholesterol at goal, A1c high Minimally active,  Minimal disease carotid in 2018   Total encounter time more than 30 minutes  Greater than 50% was spent in counseling and coordination of care with the patient    Orders Placed This Encounter  Procedures   Basic metabolic panel   EKG 80-XKPV     Signed, Esmond Plants, M.D., Ph.D. 08/29/2022  [place], [place]

## 2022-08-29 ENCOUNTER — Encounter: Payer: Self-pay | Admitting: Cardiovascular Disease

## 2022-08-29 ENCOUNTER — Ambulatory Visit: Payer: HMO | Attending: Cardiovascular Disease | Admitting: Cardiovascular Disease

## 2022-08-29 VITALS — BP 120/60 | HR 59 | Ht 67.0 in | Wt 216.4 lb

## 2022-08-29 DIAGNOSIS — E1159 Type 2 diabetes mellitus with other circulatory complications: Secondary | ICD-10-CM | POA: Diagnosis not present

## 2022-08-29 DIAGNOSIS — Z794 Long term (current) use of insulin: Secondary | ICD-10-CM | POA: Diagnosis not present

## 2022-08-29 DIAGNOSIS — Z79899 Other long term (current) drug therapy: Secondary | ICD-10-CM

## 2022-08-29 DIAGNOSIS — I10 Essential (primary) hypertension: Secondary | ICD-10-CM

## 2022-08-29 DIAGNOSIS — I152 Hypertension secondary to endocrine disorders: Secondary | ICD-10-CM

## 2022-08-29 DIAGNOSIS — I5032 Chronic diastolic (congestive) heart failure: Secondary | ICD-10-CM

## 2022-08-29 DIAGNOSIS — E782 Mixed hyperlipidemia: Secondary | ICD-10-CM

## 2022-08-29 DIAGNOSIS — E1151 Type 2 diabetes mellitus with diabetic peripheral angiopathy without gangrene: Secondary | ICD-10-CM

## 2022-08-29 DIAGNOSIS — I25118 Atherosclerotic heart disease of native coronary artery with other forms of angina pectoris: Secondary | ICD-10-CM

## 2022-08-29 MED ORDER — DAPAGLIFLOZIN PROPANEDIOL 10 MG PO TABS: 10.0000 mg | ORAL_TABLET | Freq: Every day | ORAL | 6 refills | Status: DC

## 2022-08-29 NOTE — Patient Instructions (Addendum)
Medication Instructions:  - Your physician has recommended you make the following change in your medication:   1) STOP potassium  2) START farxiga 10 mg: - take 1 tablet by mouth once daily  Samples Given: Farxiga 10 mg [id number]  Exp: 09/20/2024  # 1 box   Patient Assistance Application provided today for Farxiga if needed  If you need a refill on your cardiac medications before your next appointment, please call your pharmacy.    Lab work: - Your physician recommends that you return for lab work in: 1 month (around 09/29/22)  [place] Entrance at [facility] 1st desk on the right to check in (REGISTRATION)  Lab hours: Monday- Friday (7:30 am- 5:30 pm)   Testing/Procedures: No new testing needed   Follow-Up: At [facility], you and your health needs are our priority.  As part of our continuing mission to provide you with exceptional heart care, we have created designated Provider Care Teams.  These Care Teams include your primary Cardiologist (physician) and Advanced Practice Providers (APPs -  Physician Assistants and Nurse Practitioners) who all work together to provide you with the care you need, when you need it.  You will need a follow up appointment in 6 months  Providers on your designated Care Team:   Murray Hodgkins, NP Christell Faith, PA-C Cadence Kathlen Mody, [place]  COVID-19 Vaccine Information can be found at: [email or web address].uk For questions related to vaccine distribution or appointments, please email [email or web address] or call 6080138211.    Dapagliflozin Tablets What is this medication? DAPAGLIFLOZIN (DAP a gli FLOE zin) treats type 2 diabetes. It works by helping your kidneys remove sugar (glucose) from your blood through the urine, which decreases your blood sugar. It can also be used to lower the risk of heart attack, stroke, kidney disease, and hospitalization for heart failure in people  with type 2 diabetes. Changes to diet and exercise are often combined with this medication. This medicine may be used for other purposes; ask your health care provider or pharmacist if you have questions. COMMON BRAND NAME(S): Wilder Glade What should I tell my care team before I take this medication? They need to know if you have any of these conditions: Dehydration Diabetic ketoacidosis Diet low in salt Eating less due to illness, surgery, dieting, or any other reason Frequently drink alcohol Having surgery History of pancreatitis or pancreas problems History of yeast infection of the penis or vagina Infection in the bladder, kidneys, or urinary tract Kidney disease Low blood pressure On dialysis Problems urinating Type 1 diabetes Uncircumcised male An unusual or allergic reaction to dapagliflozin, other medications, foods, dyes, or preservatives Pregnant or trying to get pregnant Breast-feeding How should I use this medication? Take this medication by mouth with water. Take it as directed on the prescription label at the same time every day. You can take it with or without food. If it upsets your stomach, take it with food. Keep taking it unless your care team tells you to stop. A special MedGuide will be given to you by the pharmacist with each prescription and refill. Be sure to read this information carefully each time. Talk to your care team about the use of this medication in children. Special care may be needed. Overdosage: If you think you have taken too much of this medicine contact a poison control center or emergency room at once. NOTE: This medicine is only for you. Do not share this medicine with others. What if I miss a dose? If you  miss a dose, take it as soon as you can. If it is almost time for your next dose, take only that dose. Do not take double or extra doses. What may interact with this medication? Lithium Sulfonylureas, such as glimepiride, glipizide,  glyburide This list may not describe all possible interactions. Give your health care provider a list of all the medicines, herbs, non-prescription drugs, or dietary supplements you use. Also tell them if you smoke, drink alcohol, or use illegal drugs. Some items may interact with your medicine. What should I watch for while using this medication? Visit your care team for regular checks on your progress. Tell your care team if your symptoms do not start to get better or if they get worse. This medication can cause a serious condition in which there is too much acid in the blood. If you develop nausea, vomiting, stomach pain, unusual tiredness, or breathing problems, stop taking this medication and call your care team right away. If possible, use a ketone dipstick to check for ketones in your urine. Check with your care team if you have severe diarrhea, nausea, and vomiting, or if you sweat a lot. The loss of too much body fluid may make it dangerous for you to take this medication. A test called the HbA1C (A1C) will be monitored. This is a simple blood test. It measures your blood sugar control over the last 2 to 3 months. You will receive this test every 3 to 6 months. Learn how to check your blood sugar. Learn the symptoms of low and high blood sugar and how to manage them. Always carry a quick-source of sugar with you in case you have symptoms of low blood sugar. Examples include hard sugar candy or glucose tablets. Make sure others know that you can choke if you eat or drink when you develop serious symptoms of low blood sugar, such as seizures or unconsciousness. Get medical help at once. Tell your care team if you have high blood sugar. You might need to change the dose of your medication. If you are sick or exercising more than usual, you may need to change the dose of your medication. Do not skip meals. Ask your care team if you should avoid alcohol. Many nonprescription cough and cold products  contain sugar or alcohol. These can affect blood sugar. Wear a medical ID bracelet or chain. Carry a card that describes your condition. List the medications and doses you take on the card. What side effects may I notice from receiving this medication? Side effects that you should report to your care team as soon as possible: Allergic reactions--skin rash, itching, hives, swelling of the face, lips, tongue, or throat Dehydration--increased thirst, dry mouth, feeling faint or lightheaded, headache, dark yellow or brown urine Diabetic ketoacidosis (DKA)--increased thirst or amount of urine, dry mouth, fatigue, fruity odor to breath, trouble breathing, stomach pain, nausea, vomiting Genital yeast infection--redness, swelling, pain, or itchiness, odor, thick or lumpy discharge New pain or tenderness, change in skin color, sores or ulcers, infection of the leg or foot Infection or redness, swelling, tenderness, or pain in the genitals, or area from the genitals to the back of the rectum Urinary tract infection (UTI)--burning when passing urine, passing frequent small amounts of urine, bloody or cloudy urine, pain in the lower back or sides This list may not describe all possible side effects. Call your doctor for medical advice about side effects. You may report side effects to FDA at 1-800-FDA-1088. Where should I  keep my medication? Keep out of the reach of children and pets. Store at room temperature between 20 and 25 degrees C (68 and 77 degrees F). Get rid of any unused medication after the expiration date. To get rid of medications that are no longer needed or have expired: Take the medication to a medication take-back program. Check with your pharmacy or law enforcement to find a location. If you cannot return the medication, check the label or package insert to see if the medication should be thrown out in the garbage or flushed down the toilet. If you are not sure, ask your care team. If it is  safe to put it in the trash, take the medication out of the container. Mix the medication with cat litter, dirt, coffee grounds, or other unwanted substance. Seal the mixture in a bag or container. Put it in the trash. NOTE: This sheet is a summary. It may not cover all possible information. If you have questions about this medicine, talk to your doctor, pharmacist, or health care provider.  2023 Elsevier/Gold Standard (2021-01-20 00:00:00)

## 2022-08-31 ENCOUNTER — Telehealth: Payer: Self-pay | Admitting: Cardiovascular Disease

## 2022-08-31 NOTE — Telephone Encounter (Signed)
Pt c/o medication issue:  1. Name of Medication: Farxiga  2. How are you currently taking this medication (dosage and times per day)?   3. Are you having a reaction (difficulty breathing--STAT)?   4. What is your medication issue? Patient's daughter is calling to find out why was patient started on Farxiga? She was unable to come to with him at his last office Visit. That is when he was prescribed Farxigat

## 2022-09-01 NOTE — Telephone Encounter (Signed)
Pt daughter calling for an update 

## 2022-09-02 NOTE — Telephone Encounter (Signed)
I called and spoke with the patient's daughter, Joseph [name] (ok per DPR).  I have advised her of Dr. Donivan Scull feedback as stated below. Per Joseph [name], she has read up on the Farxiga and aware of the benefit, but she does have concerns about the possible side effects.  She advised the patient lives alone and has caregivers come in 4 hours a day. She lives in [place]. However, he will go 2 weeks and not bathe- she has to remind him. He has a chronic rash to his groin area that he uses cream for.  She has reached out to Dr. Gabriel Carina as she had just seen the patient and adjusted his insulin. Joseph [name] has notified Dr. Gabriel Carina that the patient has started [place]. She advised Dr. Gabriel Carina was concerned about this due to recent insulin adjustments. The patient is scheduled to follow back up with her on 09/14/22 due to the [place].  Per Joseph [name], she is just wanting to know, in the context of the patient's age, living alone,  current diabetic meds, not cleaning himself, and other potential side effects, how crucial it is for hte patient to be on this medication.  I advised Joseph [name] that these are all valid concerns and great questions to ask. I explained to that I am concerned that the patient does not keep his groin area clean and potential side effects with this. She states he is urinating more frequently with the [place] as well- I have advised this is to be expected.  Joseph [name] is aware I will forward her concerns back to Dr. Rockey Situ for further review and we will be back in touch once further recommendations/ feedback is received.  Joseph [name] voices understanding and was appreciative of the call.

## 2022-09-02 NOTE — Telephone Encounter (Signed)
Minna Merritts, MD  Sent: Fri September 02, 2022  3:36 PM  To: Emily Filbert, RN          Message  Joseph [name] is very beneficial in the setting of congestive heart failure, cardiomyopathy  Joseph [name] has moderately reduced ejection fraction and at high risk for congestive heart failure and hospital admission    Links provided below for family to read    [email or web address]    Thx  TGollan

## 2022-09-05 NOTE — Telephone Encounter (Signed)
Minna Merritts, MD  Sent: Fri September 02, 2022  5:46 PM  To: Emily Filbert, RN          Message  Daughter will need to make a decision.  Perhaps she can wait and talk with endocrine first on their next follow-up  Thx  TG

## 2022-09-05 NOTE — Telephone Encounter (Signed)
Reviewed recommendations with patients daughter. She verbalized understanding and reports that he has upcoming appointment with Endocrinology and will see what their thoughts are. Instructed her to call back if she has any further concerns.

## 2022-09-06 ENCOUNTER — Encounter: Payer: Self-pay | Admitting: Cardiovascular Disease

## 2022-09-06 NOTE — Telephone Encounter (Signed)
Patient is calling requesting to speak with Dr. Rockey Situ directly regarding a personal matter he did not wish to disclose. Please advise.

## 2022-09-19 ENCOUNTER — Telehealth: Payer: Self-pay | Admitting: Cardiovascular Disease

## 2022-09-19 NOTE — Telephone Encounter (Signed)
Pt c/o medication issue:  1. Name of Medication:   dapagliflozin propanediol (FARXIGA) 10 MG TABS tablet    2. How are you currently taking this medication (dosage and times per day)? Take 1 tablet (10 mg total) by mouth daily.  3. Are you having a reaction (difficulty breathing--STAT)? No  4. What is your medication issue? Daughter states that pt's PCP advised him to stop taking medication due to pt developing a rash as well as his genitals were hurting. Please advise

## 2022-09-20 NOTE — Telephone Encounter (Signed)
Left a message for patient to call office regarding Farxiga

## 2022-09-20 NOTE — Telephone Encounter (Signed)
Left voicemail message to call back for review of provider recommendations.   Minna Merritts, MD  Sent: Mon September 19, 2022  5:12 PM  To: [facility]   Message  If he is having side effects as detailed ,  okay to hold the [place]

## 2022-09-21 ENCOUNTER — Ambulatory Visit: Payer: HMO | Admitting: Family Medicine

## 2022-09-23 ENCOUNTER — Encounter: Payer: Self-pay | Admitting: Family Medicine

## 2022-09-23 ENCOUNTER — Ambulatory Visit (INDEPENDENT_AMBULATORY_CARE_PROVIDER_SITE_OTHER): Payer: HMO | Admitting: Family Medicine

## 2022-09-23 VITALS — BP 164/60 | HR 61 | Temp 97.6°F | Resp 18 | Wt 217.0 lb

## 2022-09-23 DIAGNOSIS — N529 Male erectile dysfunction, unspecified: Secondary | ICD-10-CM

## 2022-09-23 DIAGNOSIS — I10 Essential (primary) hypertension: Secondary | ICD-10-CM | POA: Diagnosis not present

## 2022-09-23 DIAGNOSIS — E1151 Type 2 diabetes mellitus with diabetic peripheral angiopathy without gangrene: Secondary | ICD-10-CM | POA: Diagnosis not present

## 2022-09-23 DIAGNOSIS — Z23 Encounter for immunization: Secondary | ICD-10-CM

## 2022-09-23 DIAGNOSIS — Z794 Long term (current) use of insulin: Secondary | ICD-10-CM

## 2022-09-23 MED ORDER — SILDENAFIL CITRATE 50 MG PO TABS: 50.0000 mg | ORAL_TABLET | Freq: Every day | ORAL | 0 refills | Status: DC | PRN

## 2022-09-23 NOTE — Progress Notes (Signed)
I,Roshena L Chambers,acting as a scribe for Lelon Huh, MD.,have documented all relevant documentation on the behalf of Lelon Huh, MD,as directed by  Lelon Huh, MD while in the presence of Lelon Huh, MD.   Established patient visit   Patient: Joseph [name]   DOB: 01-14-1925   88 y.o. Male  MRN: [id number] Visit Date: 09/23/2022  Today's healthcare provider: Lelon Huh, MD   Chief Complaint  Patient presents with   Hypertension   Subjective    HPI  Hypertension, follow-up  BP Readings from Last 3 Encounters:  09/23/22 (!) 164/60  08/29/22 120/60  06/01/22 132/66   Wt Readings from Last 3 Encounters:  09/23/22 217 lb (98.4 kg)  08/29/22 216 lb 6 oz (98.1 kg)  06/01/22 213 lb (96.6 kg)     He was last seen for hypertension 3 months ago.  BP at that visit was 132/66. Management since that visit includes continuing same medication. Patient also see's cardiologist Dr. Rockey Situ. Blood pressure was 120/60 at last visit with him a month ago.  He reports good compliance with treatment. He is not having side effects.  He is following a Regular diet. He is not exercising. He does not smoke.  Use of agents associated with hypertension: thyroid hormones.   Outside blood pressures are 140/60. Symptoms: No chest pain No chest pressure  No palpitations No syncope  No dyspnea No orthopnea  No paroxysmal nocturnal dyspnea Yes lower extremity edema   Pertinent labs Lab Results  Component Value Date   CHOL 121 12/17/2019   HDL 34 (L) 12/17/2019   LDLCALC 67 12/17/2019   TRIG 98 12/17/2019   CHOLHDL 3.6 12/17/2019   Lab Results  Component Value Date   NA 136 06/08/2022   K 4.4 06/08/2022   CREATININE 1.40 (H) 06/08/2022   GFRNONAA 47 (L) 06/08/2022   GLUCOSE 141 (H) 06/08/2022   TSH 2.220 05/11/2020     The ASCVD Risk score (Arnett DK, et al., 2019) failed to calculate for the following reasons:   The 2019 ASCVD risk score is only valid for ages  4 to 36   The patient has a prior MI or stroke diagnosis  ---------------------------------------------------------------------------------------------------   Diabetes is managed by Endocrinology He was prescribed Farxiga by cardiology but stopped within a week due to GU rash which resolved after stopping the medication.   He also complains of difficulty maintaining erections and would like to try Viagra. He states he has only had to take 2 nitroglycerine tables in his life.   Medications: Outpatient Medications Prior to Visit  Medication Sig Note   atorvastatin (LIPITOR) 80 MG tablet Take 1 tablet (80 mg total) by mouth daily.    Cholecalciferol 125 MCG (5000 UT) capsule Take 5,000 Units by mouth at bedtime.    clobetasol cream (TEMOVATE) 6.57 % Apply 1 application topically 2 (two) times daily. As needed to hands and elbows    clopidogrel (PLAVIX) 75 MG tablet Take 1 tablet (75 mg total) by mouth daily.    Continuous Blood Gluc Receiver (FREESTYLE LIBRE 2 READER) DEVI Use 1 Device as directed    Continuous Blood Gluc Sensor (FREESTYLE LIBRE 14 DAY SENSOR) MISC USE AS DIRECTED EVERY 14 DAYS    hydrocortisone 2.5 % cream APPLY TOPICALLY TWICE DAILY AS NEEDED TO FACE, CREASE, GROIN, BUTT.    insulin aspart (NOVOLOG) 100 UNIT/ML injection Take from 14 - 34 units before meals, as directed. Max daily dose is 90 units.  insulin glargine (LANTUS) 100 UNIT/ML injection Inject 40 Units into the skin daily.    ketoconazole (NIZORAL) 2 % cream APPLY TOPICALLY TWICE DAILY AS NEEDED FOR IRRITATION TO GROIN, FACE, AND BUTTOCKS    levothyroxine (SYNTHROID) 50 MCG tablet TAKE 1 TABLET BY MOUTH ONCE DAILY BEFORE BREAKFAST    losartan (COZAAR) 100 MG tablet Take 1 tablet (100 mg total) by mouth daily.    Magnesium 250 MG TABS Take 250 mg by mouth as needed (swelling). Take when you take your Torsemide.    mometasone (ELOCON) 0.1 % lotion Apply topically daily.    nitroGLYCERIN (NITROSTAT) 0.4 MG SL  tablet Place 1 tablet (0.4 mg total) under the tongue every 5 (five) minutes as needed for chest pain.    omeprazole (PRILOSEC) 20 MG capsule Take 1 capsule by mouth once daily    spironolactone (ALDACTONE) 25 MG tablet Take 1 tablet (25 mg total) by mouth daily.    torsemide (DEMADEX) 20 MG tablet Take 1 tablet (20 mg total) by mouth daily.    dapagliflozin propanediol (FARXIGA) 10 MG TABS tablet Take 1 tablet (10 mg total) by mouth daily. (Patient not taking: Reported on 09/23/2022) 09/23/2022: Stopped taking 3 weeks ago due to breaking out in a rash. Patient reports rash resolved after stopping [place].   No facility-administered medications prior to visit.    Review of Systems  Constitutional:  Negative for appetite change, chills and fever.  Respiratory:  Negative for chest tightness, shortness of breath and wheezing.   Cardiovascular:  Positive for leg swelling. Negative for chest pain and palpitations.  Gastrointestinal:  Negative for abdominal pain, nausea and vomiting.       Objective    BP (!) 164/60 (BP Location: Right Arm, Patient Position: Sitting, Cuff Size: Large)   Pulse 61   Temp 97.6 F (36.4 C) (Oral)   Resp 18   Wt 217 lb (98.4 kg)   SpO2 99% Comment: room air  BMI 33.99 kg/m    Today's Vitals   09/23/22 1338 09/23/22 1343  BP: (!) 148/68 (!) 164/60  Pulse: 61   Resp: 18   Temp: 97.6 F (36.4 C)   TempSrc: Oral   SpO2: 99%   Weight: 217 lb (98.4 kg)    Body mass index is 33.99 kg/m.   Physical Exam   General: Appearance:    Mildly obese male in no acute distress  Eyes:    PERRL, conjunctiva/corneas clear, EOM's intact       Lungs:     Clear to auscultation bilaterally, respirations unlabored  Heart:    Normal heart rate. Normal rhythm. No murmurs, rubs, or gallops.    MS:   All extremities are intact.    Neurologic:   Awake, alert, oriented x 3. No apparent focal neurological defect.         Assessment & Plan     1. Erectile dysfunction,  unspecified erectile dysfunction type  - sildenafil (VIAGRA) 50 MG tablet; Take 1 tablet (50 mg total) by mouth daily as needed for erectile dysfunction.  Dispense: 10 tablet; Refill: 0  2. Essential hypertension BP not at goal today, but usually much better. Continue same medications for now, follow up cardiology as scheduled.   3. Type 2 diabetes mellitus with diabetic peripheral angiopathy without gangrene, with long-term current use of insulin (HCC) Stable on current medications although most recent A1c at endocrinology was not at goal. Is to follow up with Dr. Nilda Simmer as scheduled.  The entirety of the information documented in the History of Present Illness, Review of Systems and Physical Exam were personally obtained by me. Portions of this information were initially documented by the CMA and reviewed by me for thoroughness and accuracy.     Ziyana Morikawa, MD  [facility] [phone] (phone) [phone] (fax)  [facility]  

## 2022-09-23 NOTE — Telephone Encounter (Signed)
Left message for pt to call back  °

## 2022-09-23 NOTE — Patient Instructions (Signed)
.   Please review the attached list of medications and notify my office if there are any errors.   . Please bring all of your medications to every appointment so we can make sure that our medication list is the same as yours.   

## 2022-09-26 NOTE — Telephone Encounter (Signed)
The patient's daughter stated that the patient has stopped taking the [place] for now. His endocrinologist advised him to stop it and prescribed him some fungal powder to see if it resolves.

## 2022-09-26 NOTE — Telephone Encounter (Signed)
Error

## 2022-09-26 NOTE — Telephone Encounter (Signed)
The patient called in wanting to know if Dr. Rockey Situ could prescribe him Viagra.

## 2022-09-26 NOTE — Telephone Encounter (Signed)
Pt is returning call. Transferred to Lattie Haw, Therapist, sports.

## 2022-09-27 NOTE — Telephone Encounter (Signed)
PCP sent in viagra 50 mg #10 on 09/23/22.

## 2022-09-29 NOTE — Telephone Encounter (Signed)
Spoke w/ pt and advised him of Dr. Donivan Scull recommendation. He reports that he does have nitro, but has never taken it. Advised him NOT to take it w/ viagra.  He verbalizes understanding and is appreciative of the call.

## 2022-09-29 NOTE — Telephone Encounter (Signed)
Joseph Merritts, MD  [facility] Triage  Should be fine Does not appear he is on nitro Thx TGollan

## 2022-09-29 NOTE — Telephone Encounter (Signed)
Pt c/o medication issue:  1. Name of Medication: Viagra   2. How are you currently taking this medication (dosage and times per day)?   3. Are you having a reaction (difficulty breathing--STAT)?   4. What is your medication issue? Pt was confused as to why he hasn't received a call back in regards to this medication. He states that he wanted to find out if his cardiologist thought it would be okay for him to take this. Requesting call back.  I did explain that nurse did see that it had been prescribed by PCP, but that they didn't realize he had further questions and apologized. Patient states that if he doesn't answer the phone, to please leave a message in regards to this and if this medication will be okay to take.

## 2022-09-29 NOTE — Telephone Encounter (Signed)
Pt would like to know it is safe to take viagra w/ his meds/heart conditions. It was prescribed recently by his PCP.

## 2022-10-01 ENCOUNTER — Other Ambulatory Visit: Payer: Self-pay | Admitting: Cardiovascular Disease

## 2022-10-06 ENCOUNTER — Telehealth: Payer: Self-pay | Admitting: Family Medicine

## 2022-10-06 ENCOUNTER — Encounter: Payer: Self-pay | Admitting: Family Medicine

## 2022-10-06 ENCOUNTER — Ambulatory Visit (INDEPENDENT_AMBULATORY_CARE_PROVIDER_SITE_OTHER): Payer: HMO | Admitting: Family Medicine

## 2022-10-06 VITALS — BP 140/80 | HR 85 | Temp 98.0°F | Resp 18 | Wt 213.0 lb

## 2022-10-06 DIAGNOSIS — J441 Chronic obstructive pulmonary disease with (acute) exacerbation: Secondary | ICD-10-CM | POA: Diagnosis not present

## 2022-10-06 MED ORDER — AZITHROMYCIN 250 MG PO TABS: ORAL_TABLET | ORAL | 0 refills | Status: AC

## 2022-10-06 MED ORDER — FLUTICASONE-SALMETEROL 100-50 MCG/ACT IN AEPB: 1.0000 | INHALATION_SPRAY | Freq: Two times a day (BID) | RESPIRATORY_TRACT | 3 refills | Status: DC

## 2022-10-06 MED ORDER — PREDNISONE 50 MG PO TABS: 50.0000 mg | ORAL_TABLET | Freq: Every day | ORAL | 0 refills | Status: DC

## 2022-10-06 NOTE — Assessment & Plan Note (Signed)
Acute, endorses DOE and PND- using 2 pillows in place of 1. Denies known exposures. Endorses sore throat, cough and post-nasal drip. Will treat for COPD exacerbation Discussed need for further evaluation  Plans to have family in town for 89th bday and Thanksgiving

## 2022-10-06 NOTE — Progress Notes (Signed)
I,April Miller,acting as a scribe for Gwyneth Sprout, FNP.,have documented all relevant documentation on the behalf of Gwyneth Sprout, FNP,as directed by  Gwyneth Sprout, FNP while in the presence of Gwyneth Sprout, FNP.   Established patient visit   Patient: Joseph [name]   DOB: 07-21-1925   88 y.o. Male  MRN: [id number] Visit Date: 10/06/2022  Today's healthcare provider: Gwyneth Sprout, FNP  Re Introduced to nurse practitioner role and practice setting.  All questions answered.  Discussed provider/patient relationship and expectations.  Chief Complaint  Patient presents with   Sore Throat   Subjective    HPI  Patient has had cough, sinus drainage and sore throat for 2 days. No fever.  Medications: Outpatient Medications Prior to Visit  Medication Sig   atorvastatin (LIPITOR) 80 MG tablet Take 1 tablet (80 mg total) by mouth daily.   Cholecalciferol 125 MCG (5000 UT) capsule Take 5,000 Units by mouth at bedtime.   clobetasol cream (TEMOVATE) 0.86 % Apply 1 application topically 2 (two) times daily. As needed to hands and elbows   clopidogrel (PLAVIX) 75 MG tablet Take 1 tablet (75 mg total) by mouth daily.   Continuous Blood Gluc Receiver (FREESTYLE LIBRE 2 READER) DEVI Use 1 Device as directed   Continuous Blood Gluc Sensor (FREESTYLE LIBRE 14 DAY SENSOR) MISC USE AS DIRECTED EVERY 14 DAYS   hydrocortisone 2.5 % cream APPLY TOPICALLY TWICE DAILY AS NEEDED TO FACE, CREASE, GROIN, BUTT.   insulin aspart (NOVOLOG) 100 UNIT/ML injection Take from 14 - 34 units before meals, as directed. Max daily dose is 90 units.   insulin glargine (LANTUS) 100 UNIT/ML injection Inject 40 Units into the skin daily.   ketoconazole (NIZORAL) 2 % cream APPLY TOPICALLY TWICE DAILY AS NEEDED FOR IRRITATION TO GROIN, FACE, AND BUTTOCKS   levothyroxine (SYNTHROID) 50 MCG tablet TAKE 1 TABLET BY MOUTH ONCE DAILY BEFORE BREAKFAST   losartan (COZAAR) 100 MG tablet Take 1 tablet by mouth once daily    Magnesium 250 MG TABS Take 250 mg by mouth as needed (swelling). Take when you take your Torsemide.   mometasone (ELOCON) 0.1 % lotion Apply topically daily.   nitroGLYCERIN (NITROSTAT) 0.4 MG SL tablet Place 1 tablet (0.4 mg total) under the tongue every 5 (five) minutes as needed for chest pain.   omeprazole (PRILOSEC) 20 MG capsule Take 1 capsule by mouth once daily   sildenafil (VIAGRA) 50 MG tablet Take 1 tablet (50 mg total) by mouth daily as needed for erectile dysfunction.   spironolactone (ALDACTONE) 25 MG tablet Take 1 tablet (25 mg total) by mouth daily.   torsemide (DEMADEX) 20 MG tablet Take 1 tablet (20 mg total) by mouth daily.   dapagliflozin propanediol (FARXIGA) 10 MG TABS tablet Take 1 tablet (10 mg total) by mouth daily. (Patient not taking: Reported on 09/23/2022)   No facility-administered medications prior to visit.    Review of Systems  Constitutional:  Negative for appetite change, chills and fever.  Respiratory:  Negative for chest tightness, shortness of breath and wheezing.   Cardiovascular:  Negative for chest pain and palpitations.  Gastrointestinal:  Negative for abdominal pain, nausea and vomiting.     Objective    BP (!) 140/80   Pulse 85   Temp 98 F (36.7 C) (Oral)   Resp 18   Wt 213 lb (96.6 kg)   SpO2 95%   BMI 33.36 kg/m   Physical Exam Vitals and nursing  note reviewed.  Constitutional:      General: He is not in acute distress.    Appearance: Normal appearance. He is well-developed. He is obese. He is not ill-appearing, toxic-appearing or diaphoretic.  HENT:     Head: Normocephalic and atraumatic.     Right Ear: Tympanic membrane and ear canal normal.     Left Ear: Tympanic membrane and ear canal normal.     Ears:     Comments: HOH; at baseline per pt report    Nose: Nose normal.     Mouth/Throat:     Mouth: Mucous membranes are moist.     Pharynx: Oropharynx is clear.     Tonsils: No tonsillar exudate or tonsillar abscesses. 0 on the  right. 0 on the left.  Eyes:     Pupils: Pupils are equal, round, and reactive to light.  Cardiovascular:     Rate and Rhythm: Normal rate and regular rhythm.     Pulses: Normal pulses.     Heart sounds: Murmur heard.  Pulmonary:     Effort: Pulmonary effort is normal.     Breath sounds: Normal breath sounds.     Comments: Reports wheezing; LCTAB today Abdominal:     Comments: Endorses diarrhea x1 occurrence   Musculoskeletal:        General: Normal range of motion.     Cervical back: Normal range of motion.     Comments: Asthenia noted  Skin:    General: Skin is warm and dry.     Capillary Refill: Capillary refill takes less than 2 seconds.  Neurological:     General: No focal deficit present.     Mental Status: He is alert and oriented to person, place, and time. Mental status is at baseline.  Psychiatric:        Mood and Affect: Mood normal.        Behavior: Behavior normal.        Thought Content: Thought content normal.        Judgment: Judgment normal.     No results found for any visits on 10/06/22.  Assessment & Plan     Problem List Items Addressed This Visit       Respiratory   COPD with acute exacerbation ([facility]) - Primary    Acute, endorses DOE and PND- using 2 pillows in place of 1. Denies known exposures. Endorses sore throat, cough and post-nasal drip. Will treat for COPD exacerbation Discussed need for further evaluation  Plans to have family in town for 89th bday and Thanksgiving       Relevant Medications   predniSONE (DELTASONE) 50 MG tablet   fluticasone-salmeterol (ADVAIR) 100-50 MCG/ACT AEPB   azithromycin (ZITHROMAX) 250 MG tablet   Return if symptoms worsen or fail to improve.     Vonna Kotyk, FNP, have reviewed all documentation for this visit. The documentation on 10/06/22 for the exam, diagnosis, procedures, and orders are all accurate and complete.  Gwyneth Sprout, [place] [phone] (phone) [phone]  (fax)  [place]

## 2022-10-06 NOTE — Telephone Encounter (Addendum)
Pt is wanting to know if you are willing to take him on as your patient.  He saw you last for an ov and it was his understanding that he would be seeing you as his PCP.  Also would like to know when he needs to come back for appt.  Please advise.

## 2022-10-07 NOTE — Telephone Encounter (Signed)
Not able to take new patient's no, but Dr Quentin Cornwall is, or expect Dr. Rosanna Randy to be at [facility] in January

## 2022-10-11 NOTE — Telephone Encounter (Signed)
Appt scheduled for 01/11/2023 at 320pm

## 2022-10-14 ENCOUNTER — Ambulatory Visit: Payer: Self-pay | Admitting: *Deleted

## 2022-10-14 NOTE — Telephone Encounter (Signed)
  Chief Complaint: fatigue, SOB, cough, congestion Symptoms: patient was recently treated for COPD exacerbation- patient is better- but still having symptoms- cough, congestion- reports clear sputum, constipation, SOB with exertion. Frequency: 10 days Pertinent Negatives: Patient denies fever Disposition: '[]'$ ED /'[x]'$ Urgent Care (no appt availability in office) / '[]'$ Appointment(In office/virtual)/ '[]'$  [place] [facility]/ '[]'$ Home Care/ '[]'$ Refused Recommended Disposition /'[]'$ [place] [facility]/ '[]'$  Follow-up with PCP Additional Notes: Advised UC- patient has option for home nurse visit with insurance and may go that route.

## 2022-10-14 NOTE — Telephone Encounter (Signed)
Reason for Disposition  [1] MODERATE weakness (i.e., interferes with work, school, normal activities) AND [2] persists > 3 days  Answer Assessment - Initial Assessment Questions 1. DESCRIPTION: "Describe how you are feeling."     Drowsy- lack of energy, congestion 2. SEVERITY: "How bad is it?"  "Can you stand and walk?"   - MILD (0-3): Feels weak or tired, but does not interfere with work, school or normal activities.   - MODERATE (4-7): Able to stand and walk; weakness interferes with work, school, or normal activities.   - SEVERE (8-10): Unable to stand or walk; unable to do usual activities.     Mild/moderate 3. ONSET: "When did these symptoms begin?" (e.g., hours, days, weeks, months)     10 days 4. CAUSE: "What do you think is causing the weakness or fatigue?" (e.g., not drinking enough fluids, medical problem, trouble sleeping)     Congestion, cough, fatigue, constipation 5. NEW MEDICINES:  "Have you started on any new medicines recently?" (e.g., opioid pain medicines, benzodiazepines, muscle relaxants, antidepressants, antihistamines, neuroleptics, beta blockers)     no 6. OTHER SYMPTOMS: "Do you have any other symptoms?" (e.g., chest pain, fever, cough, SOB, vomiting, diarrhea, bleeding, other areas of pain)     Cough- clear, SOB- changes in breathing, SOB with exertion  Protocols used: Weakness (Generalized) and Fatigue-A-AH

## 2022-11-09 ENCOUNTER — Ambulatory Visit: Payer: Self-pay

## 2022-11-09 NOTE — Telephone Encounter (Signed)
  Chief Complaint: hip pain  Symptoms: L hip pain,  Frequency: 1 week  Pertinent Negatives: Patient denies recent fall  Disposition: '[]'$ ED /'[]'$ Urgent Care (no appt availability in office) / '[x]'$ Appointment(In office/virtual)/ '[]'$  [place] [facility]/ '[]'$ Home Care/ '[]'$ Refused Recommended Disposition /'[]'$ [place] [facility]/ '[]'$  Follow-up with PCP Additional Notes: pt states that he hasn't fallen since July and has just had severe pain when moving since 1 week ago. Pain is so bad causing difficulty with sleep and walking. Pt is using Aspercreme spray and pain patch as well as taking Tylenol. Offered appt today but pt has another appt this afternoon so scheduled for tomorrow at [place].   Reason for Disposition  [1] SEVERE pain (e.g., excruciating, unable to do any normal activities) AND [2] not improved after 2 hours of pain medicine  Answer Assessment - Initial Assessment Questions 1. LOCATION and RADIATION: "Where is the pain located?"      L hip radiates to waist  3. SEVERITY: "How bad is the pain?" "What does it keep you from doing?"   (Scale 1-10; or mild, moderate, severe)   -  MILD (1-3): doesn't interfere with normal activities    -  MODERATE (4-7): interferes with normal activities (e.g., work or school) or awakens from sleep, limping    -  SEVERE (8-10): excruciating pain, unable to do any normal activities, unable to walk     10 4. ONSET: "When did the pain start?" "Does it come and go, or is it there all the time?"     1 week 7. AGGRAVATING FACTORS: "What makes the hip pain worse?" (e.g., walking, climbing stairs, running)     movement 8. OTHER SYMPTOMS: "Do you have any other symptoms?" (e.g., back pain, pain shooting down leg,  fever, rash)     Has difficulty sleeping  Protocols used: Hip Pain-A-AH

## 2022-11-10 ENCOUNTER — Encounter: Payer: Self-pay | Admitting: Family Medicine

## 2022-11-10 ENCOUNTER — Other Ambulatory Visit: Payer: Self-pay | Admitting: Family Medicine

## 2022-11-10 ENCOUNTER — Ambulatory Visit (INDEPENDENT_AMBULATORY_CARE_PROVIDER_SITE_OTHER): Payer: HMO | Admitting: Family Medicine

## 2022-11-10 ENCOUNTER — Ambulatory Visit
Admission: RE | Admit: 2022-11-10 | Discharge: 2022-11-10 | Disposition: A | Discharge status: Home / Self Care | Payer: HMO | Source: Ambulatory Visit | Attending: Family Medicine | Admitting: Family Medicine

## 2022-11-10 ENCOUNTER — Ambulatory Visit
Admission: RE | Admit: 2022-11-10 | Discharge: 2022-11-10 | Disposition: A | Discharge status: Home / Self Care | Payer: HMO | Attending: Family Medicine | Admitting: Family Medicine

## 2022-11-10 VITALS — BP 154/68 | HR 96 | Temp 97.5°F | Resp 16 | Wt 216.7 lb

## 2022-11-10 DIAGNOSIS — M25552 Pain in left hip: Secondary | ICD-10-CM | POA: Diagnosis present

## 2022-11-10 DIAGNOSIS — L304 Erythema intertrigo: Secondary | ICD-10-CM

## 2022-11-10 MED ORDER — NYSTATIN 100000 UNIT/GM EX POWD: 1.0000 | Freq: Three times a day (TID) | CUTANEOUS | 0 refills | Status: DC

## 2022-11-10 MED ORDER — TRAMADOL HCL 50 MG PO TABS: 50.0000 mg | ORAL_TABLET | Freq: Three times a day (TID) | ORAL | 0 refills | Status: AC | PRN

## 2022-11-10 NOTE — Patient Instructions (Addendum)
Please report to [facility] located at:  [place], [place]  You do not need an appointment to have xrays completed.   Our office will follow up with  results once available.   I have prescribed an antifungal powder to apply 2-3 times daily to the affected area.  I Have prescribed a pain pill to take every 8 hours for severe pain    Please plan to follow-up with me in the next 2-3 weeks for your hip pain and to discuss changes in your bowel movements

## 2022-11-10 NOTE — Progress Notes (Signed)
I,Joseline E Rosas,acting as a scribe for [employer], MD.,have documented all relevant documentation on the behalf of [name], MD,as directed by  [name], MD while in the presence of [name], MD.   Established patient visit   Patient: Joseph [name]   DOB: 07-10-1925   88 y.o. Male  MRN: [id number] Visit Date: 11/10/2022  Today's healthcare provider: [name], MD   Chief Complaint  Patient presents with   Hip Pain   Subjective    HPI   Luvenia Starch is present and helps take care of Joseph [name]. They report that she assists Joseph [name] with transportation and preparing meals as his daughter lives out state.   Hip Pain: Patient complains of left hip pain. Onset of the symptoms was a week ago. Inciting event: none. Current symptoms include is worse with weight bearing, is aggravated by walking, and radiates to groin area and around his back . Associated symptoms: none. Aggravating symptoms: any weight bearing, rising after sitting, walking, and movement . Patient's overall course: gradually worsening. Patient has had prior hip problems. Previous visits for this problem: yes, last seen a few month ago by last pcp . Evaluation to date:  imaging and cortisone shots .  Treatment to date:  Tylenol, aspirin and lidocaine cream,Pain patch .  Rash on right groin  Patient reports having red and wet appearing rash in right groin for several years  He states he has been applying OTC hydrocortisone cream  He was also using powders  Her reports some improvement but still persists  Patient denies pruritus He denies tenderness He denies presence of the rash in any other regions of his body  Medications: Outpatient Medications Prior to Visit  Medication Sig   atorvastatin (LIPITOR) 80 MG tablet Take 1 tablet (80 mg total) by mouth daily.   Cholecalciferol 125 MCG (5000 UT) capsule Take 5,000 Units by  mouth at bedtime.   clobetasol cream (TEMOVATE) 2.29 % Apply 1 application topically 2 (two) times daily. As needed to hands and elbows   clopidogrel (PLAVIX) 75 MG tablet Take 1 tablet (75 mg total) by mouth daily.   Continuous Blood Gluc Receiver (FREESTYLE LIBRE 2 READER) DEVI Use 1 Device as directed   Continuous Blood Gluc Sensor (FREESTYLE LIBRE 14 DAY SENSOR) MISC USE AS DIRECTED EVERY 14 DAYS   dapagliflozin propanediol (FARXIGA) 10 MG TABS tablet Take 1 tablet (10 mg total) by mouth daily. (Patient not taking: Reported on 09/23/2022)   fluticasone-salmeterol (ADVAIR) 100-50 MCG/ACT AEPB Inhale 1 puff into the lungs 2 (two) times daily.   hydrocortisone 2.5 % cream APPLY TOPICALLY TWICE DAILY AS NEEDED TO FACE, CREASE, GROIN, BUTT.   insulin aspart (NOVOLOG) 100 UNIT/ML injection Take from 14 - 34 units before meals, as directed. Max daily dose is 90 units.   insulin glargine (LANTUS) 100 UNIT/ML injection Inject 40 Units into the skin daily.   ketoconazole (NIZORAL) 2 % cream APPLY TOPICALLY TWICE DAILY AS NEEDED FOR IRRITATION TO GROIN, FACE, AND BUTTOCKS   levothyroxine (SYNTHROID) 50 MCG tablet TAKE 1 TABLET BY MOUTH ONCE DAILY BEFORE BREAKFAST   losartan (COZAAR) 100 MG tablet Take 1 tablet by mouth once daily   Magnesium 250 MG TABS Take 250 mg by mouth as needed (swelling). Take when you take your Torsemide.   mometasone (ELOCON) 0.1 % lotion Apply topically daily.   nitroGLYCERIN (NITROSTAT) 0.4 MG SL tablet Place 1 tablet (0.4 mg total) under the tongue every  5 (five) minutes as needed for chest pain.   omeprazole (PRILOSEC) 20 MG capsule Take 1 capsule by mouth once daily   sildenafil (VIAGRA) 50 MG tablet Take 1 tablet (50 mg total) by mouth daily as needed for erectile dysfunction.   spironolactone (ALDACTONE) 25 MG tablet Take 1 tablet (25 mg total) by mouth daily.   torsemide (DEMADEX) 20 MG tablet Take 1 tablet (20 mg total) by mouth daily.   [DISCONTINUED] predniSONE  (DELTASONE) 50 MG tablet Take 1 tablet (50 mg total) by mouth daily with breakfast.   No facility-administered medications prior to visit.    Review of Systems     Objective    BP (!) 154/68 (BP Location: Left Arm, Patient Position: Sitting, Cuff Size: Large)   Pulse 96   Temp (!) 97.5 F (36.4 C) (Oral)   Resp 16   Wt 216 lb 11.2 oz (98.3 kg)   BMI 33.94 kg/m    Physical Exam  Left Hip:  - Inspection: No gross deformity, no swelling, erythema, or ecchymosis, patient is not holding left leg in external rotation with abduction  during visit/exam  - Palpation: TTP over greater trochanter and on left gluteal region - ROM: Normal range of motion on Flexion, extension, abduction, internal and external rotation - Strength: Normal strength. - Neuro/vasc: NV intact distally - Special Tests: Negative FABER and FADIR.    Skin: Patient has 5 cm x 2 cm area of erythema in the right pannus fold of his abdomen, there is no bleeding, there is no break in the skin, the area appears moist  No results found for any visits on 11/10/22.  Assessment & Plan     Problem List Items Addressed This Visit       Musculoskeletal and Integument   Intertrigo    Rash appears to be consistent with intertrigo  Will prescribe nystatin powders to be applied to the area 2-3 times daily         Other   Left hip pain - Primary    Acute problem  Most concerned for fracture of the hip  Will prescribe short course of tramadol for pain management as patient reports abnormal walking due to pain  Will obtain left hip and pelvic xray to evaluate for fracture  If normal, will plan to refer for orthopedic evaluation         Return in about 3 weeks (around 12/01/2022) for Left hip & BM.     I, [name], MD, have reviewed all documentation for this visit.  Portions of this information were initially documented by the CMA and reviewed by me for thoroughness and accuracy.      [name], MD  [facility] [phone] (phone) [phone] (fax)  [place]

## 2022-11-13 NOTE — Assessment & Plan Note (Signed)
Rash appears to be consistent with intertrigo  Will prescribe nystatin powders to be applied to the area 2-3 times daily

## 2022-11-13 NOTE — Assessment & Plan Note (Signed)
Acute problem  Most concerned for fracture of the hip  Will prescribe short course of tramadol for pain management as patient reports abnormal walking due to pain  Will obtain left hip and pelvic xray to evaluate for fracture  If normal, will plan to refer for orthopedic evaluation

## 2022-11-15 ENCOUNTER — Telehealth: Payer: Self-pay | Admitting: Family Medicine

## 2022-11-15 DIAGNOSIS — M25552 Pain in left hip: Secondary | ICD-10-CM

## 2022-11-15 NOTE — Addendum Note (Signed)
Addended by: Doristine Devoid on: 11/15/2022 04:11 PM   Modules accepted: Orders

## 2022-11-15 NOTE — Telephone Encounter (Signed)
Pt has called in after have hip x-rays on the 21st and is wanting dr to fu on his results no crm for results/ Call pt [phone]

## 2022-11-15 NOTE — Telephone Encounter (Signed)
Patient results reviewed with osteoarthritis   He was prescribed course of tramadol '50mg'$   every 8 hours PRN for pain. If he continues to have pain after completion of this course, we can prescribe an additional few days but would recommend orthopedic referral for consideration of hip injections or pain management if tramadol was not sufficient for pain.   [name], MD  [facility]

## 2022-11-15 NOTE — Addendum Note (Signed)
Addended by: Adolph Pollack on: 11/15/2022 05:02 PM   Modules accepted: Orders

## 2022-11-15 NOTE — Telephone Encounter (Signed)
Patient advised. Per patient Tramadol helps and takes it in the AM but doesn't want to keep taking Patient asking what is the next step. FYI went ahead and have pended referral. Please Review.

## 2022-11-15 NOTE — Telephone Encounter (Signed)
Orthopedics referral submitted for left hip pain.   XR without evidence of fracture at this time, OA bilaterally .   [name], MD  [facility]

## 2022-11-16 ENCOUNTER — Encounter: Payer: Self-pay | Admitting: Family Medicine

## 2022-11-16 ENCOUNTER — Ambulatory Visit (INDEPENDENT_AMBULATORY_CARE_PROVIDER_SITE_OTHER): Payer: HMO | Admitting: Family Medicine

## 2022-11-16 VITALS — BP 171/59 | HR 78 | Temp 97.5°F | Wt 220.7 lb

## 2022-11-16 DIAGNOSIS — I5043 Acute on chronic combined systolic (congestive) and diastolic (congestive) heart failure: Secondary | ICD-10-CM

## 2022-11-16 DIAGNOSIS — S61412A Laceration without foreign body of left hand, initial encounter: Secondary | ICD-10-CM | POA: Insufficient documentation

## 2022-11-16 DIAGNOSIS — E1159 Type 2 diabetes mellitus with other circulatory complications: Secondary | ICD-10-CM | POA: Diagnosis not present

## 2022-11-16 DIAGNOSIS — M25552 Pain in left hip: Secondary | ICD-10-CM | POA: Diagnosis not present

## 2022-11-16 DIAGNOSIS — I152 Hypertension secondary to endocrine disorders: Secondary | ICD-10-CM

## 2022-11-16 DIAGNOSIS — S61412S Laceration without foreign body of left hand, sequela: Secondary | ICD-10-CM | POA: Diagnosis not present

## 2022-11-16 NOTE — Assessment & Plan Note (Signed)
Chronic, currently uncontrolled with elevated BP and weight gain in setting of multiple missed doses of diuretic Encouraged regular use and if medication last longer than 6 hours recommend consult with urology to assist and follow up with dietician for further heart failure nutritional assistance Previously followed by cardiology

## 2022-11-16 NOTE — Assessment & Plan Note (Signed)
11/10/22 received a skin tear on L dorsal aspect of hand Pt has cleaned 2 times with turpentine tincture prior to application of ABX cream (OTC) Skin from tear is missing and pt notes extensive bruising in s/o plavix 75 mg Site cleansed with iodine, currently 3cm x 2 cm x 0.2 cm depth Dressed with xerofoam guaze, telfa and gauze. Wrapped in [place]. Continue to monitor recommend gentle cleaning, use of ABX cream PRN and telfa with coban wrap. Recommend 1 week f/u given uncontrolled DM with A1c >7%

## 2022-11-16 NOTE — Assessment & Plan Note (Signed)
Chronic, elevated Pt is up 10# in setting of CHF; notes missed doses of torsemide 20 mg daily due to social situations and appts Goal <130/<80; however, permissive HTN allowed d/t age Continue spiro 69, losartan 100 mg, has stopped farxiga d/t side effects

## 2022-11-16 NOTE — Patient Instructions (Signed)
The CDC recommends two doses of Shingrix (the new shingles vaccine) separated by 2 to 6 months for adults age 86 years and older. I recommend checking with your insurance plan regarding coverage for this vaccine.    

## 2022-11-16 NOTE — Assessment & Plan Note (Signed)
Further discussion regarding recent xrays; pt remains hesistant to use tramadol due to fear of addiction. Encouraged to remain more active. OK to use APAP 1000 mg TID PRN to assist. Pt declines referral to ortho at this time for possible injection therapies or plan for replacement.
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
## 2022-11-18 NOTE — Progress Notes (Signed)
I,Sulibeya S Dimas,acting as a Education administrator for [employer], MD.,have documented all relevant documentation on the behalf of [name], MD,as directed by  [name], MD while in the presence of [name], MD.     Established patient visit   Patient: Joseph [name]   DOB: [birth year]   88 y.o. Male  MRN: [id number] Visit Date: 11/23/2022  Today's healthcare provider: [name], MD   Chief Complaint  Patient presents with   Follow-up   Subjective    HPI  Follow up for skin tear of left hand  The patient was last seen for this 1 weeks ago. Changes made at last visit include no changes.  He reports good compliance with treatment. He feels that condition is Improved. He is not having side effects.   -----------------------------------------------------------------------------------------  Medications: Outpatient Medications Prior to Visit  Medication Sig   atorvastatin (LIPITOR) 80 MG tablet Take 1 tablet (80 mg total) by mouth daily.   Cholecalciferol 125 MCG (5000 UT) capsule Take 5,000 Units by mouth at bedtime.   clobetasol cream (TEMOVATE) 7.86 % Apply 1 application topically 2 (two) times daily. As needed to hands and elbows   clopidogrel (PLAVIX) 75 MG tablet Take 1 tablet (75 mg total) by mouth daily.   Continuous Blood Gluc Receiver (FREESTYLE LIBRE 2 READER) DEVI Use 1 Device as directed   Continuous Blood Gluc Sensor (FREESTYLE LIBRE 14 DAY SENSOR) MISC USE AS DIRECTED EVERY 14 DAYS   hydrocortisone 2.5 % cream APPLY TOPICALLY TWICE DAILY AS NEEDED TO FACE, CREASE, GROIN, BUTT.   insulin aspart (NOVOLOG) 100 UNIT/ML injection Take from 14 - 34 units before meals, as directed. Max daily dose is 90 units.   insulin glargine (LANTUS) 100 UNIT/ML injection Inject 40 Units into the skin daily.   ketoconazole (NIZORAL) 2 % cream APPLY TOPICALLY TWICE DAILY AS NEEDED FOR IRRITATION TO GROIN, FACE, AND  BUTTOCKS   levothyroxine (SYNTHROID) 50 MCG tablet TAKE 1 TABLET BY MOUTH ONCE DAILY BEFORE BREAKFAST   losartan (COZAAR) 100 MG tablet Take 1 tablet by mouth once daily   Magnesium 250 MG TABS Take 250 mg by mouth as needed (swelling). Take when you take your Torsemide.   mometasone (ELOCON) 0.1 % lotion Apply topically daily.   nitroGLYCERIN (NITROSTAT) 0.4 MG SL tablet Place 1 tablet (0.4 mg total) under the tongue every 5 (five) minutes as needed for chest pain.   nystatin (MYCOSTATIN/NYSTOP) powder Apply 1 Application topically 3 (three) times daily.   omeprazole (PRILOSEC) 20 MG capsule Take 1 capsule by mouth once daily   potassium chloride (KLOR-CON M10) 10 MEQ tablet Take 10 mEq by mouth as needed.   spironolactone (ALDACTONE) 25 MG tablet Take 1 tablet (25 mg total) by mouth daily.   torsemide (DEMADEX) 20 MG tablet Take 1 tablet (20 mg total) by mouth daily.   [DISCONTINUED] fluticasone-salmeterol (ADVAIR) 100-50 MCG/ACT AEPB Inhale 1 puff into the lungs 2 (two) times daily. (Patient not taking: Reported on 11/23/2022)   [DISCONTINUED] sildenafil (VIAGRA) 50 MG tablet Take 1 tablet (50 mg total) by mouth daily as needed for erectile dysfunction. (Patient not taking: Reported on 11/23/2022)   No facility-administered medications prior to visit.    Review of Systems     Objective    BP (!) 156/60 (BP Location: Left Arm, Cuff Size: Large)   Pulse 84   Temp 97.9 F (36.6 C) (Oral)   Resp 20   Wt 219 lb 3.2 oz (99.4 kg)  BMI 34.33 kg/m  BP Readings from Last 3 Encounters:  11/23/22 (!) 156/60  11/16/22 (!) 171/59  11/10/22 (!) 154/68   Wt Readings from Last 3 Encounters:  11/23/22 219 lb 3.2 oz (99.4 kg)  11/16/22 220 lb 11.2 oz (100.1 kg)  11/10/22 216 lb 11.2 oz (98.3 kg)     Problem List Items Addressed This Visit       Musculoskeletal and Integument   Skin tear of left hand without complication - Primary    Improving  Does not appear to have cellulitis  Advised  to continue applying antibiotic ointment and alternate with vaseline application  Keeping the area covered with fabric bandages  Discussed return precautions including warmth, streaking of redness, increased pain or swelling           Return if symptoms worsen or fail to improve.      I, [name], MD, have reviewed all documentation for this visit.  Portions of this information were initially documented by the CMA and reviewed by me for thoroughness and accuracy.      [name], MD  [facility] [phone] (phone) [phone] (fax)  [place]

## 2022-11-22 ENCOUNTER — Ambulatory Visit: Payer: HMO | Admitting: Family Medicine

## 2022-11-23 ENCOUNTER — Ambulatory Visit (INDEPENDENT_AMBULATORY_CARE_PROVIDER_SITE_OTHER): Payer: HMO | Admitting: Family Medicine

## 2022-11-23 ENCOUNTER — Encounter: Payer: Self-pay | Admitting: Family Medicine

## 2022-11-23 VITALS — BP 156/60 | HR 84 | Temp 97.9°F | Resp 20 | Wt 219.2 lb

## 2022-11-23 DIAGNOSIS — S61412S Laceration without foreign body of left hand, sequela: Secondary | ICD-10-CM | POA: Diagnosis not present

## 2022-11-23 NOTE — Patient Instructions (Signed)
Please use the non-adherent pads to keep the wound covered throughout the day   Please use the antibiotic ointment every other day and cover with a large fabric bandage   You can use vaseline on the other days   Please notify us and seek immediate care if you notice red streaks traveling up your arm or into the fingers, if you develop fever, pain throughout the hand, or have green/yellow or bloody drainage from the wound as these are signs of infection

## 2022-11-23 NOTE — Progress Notes (Deleted)
I,Edger Husain S Junette Bernat,acting as a Education administrator for [employer], MD.,have documented all relevant documentation on the behalf of [name], MD,as directed by  [name], MD while in the presence of [name], MD.     Established patient visit   Patient: Joseph [name]   DOB: 1925-08-11   89 y.o. Male  MRN: [id number] Visit Date: 11/23/2022  Today's healthcare provider: [name], MD   Chief Complaint  Patient presents with   Follow-up   Subjective    HPI  Follow up for skin tear of left hand  The patient was last seen for this 1 weeks ago. Changes made at last visit include cleaned and wrapped in coban. Continue to clean, use ABX cream PRN and keep wrapped.  He reports excellent compliance with treatment. He feels that condition is Improved. He is not having side effects.   -----------------------------------------------------------------------------------------   Medications: Outpatient Medications Prior to Visit  Medication Sig   atorvastatin (LIPITOR) 80 MG tablet Take 1 tablet (80 mg total) by mouth daily.   Cholecalciferol 125 MCG (5000 UT) capsule Take 5,000 Units by mouth at bedtime.   clobetasol cream (TEMOVATE) 5.46 % Apply 1 application topically 2 (two) times daily. As needed to hands and elbows   clopidogrel (PLAVIX) 75 MG tablet Take 1 tablet (75 mg total) by mouth daily.   Continuous Blood Gluc Receiver (FREESTYLE LIBRE 2 READER) DEVI Use 1 Device as directed   Continuous Blood Gluc Sensor (FREESTYLE LIBRE 14 DAY SENSOR) MISC USE AS DIRECTED EVERY 14 DAYS   hydrocortisone 2.5 % cream APPLY TOPICALLY TWICE DAILY AS NEEDED TO FACE, CREASE, GROIN, BUTT.   insulin aspart (NOVOLOG) 100 UNIT/ML injection Take from 14 - 34 units before meals, as directed. Max daily dose is 90 units.   insulin glargine (LANTUS) 100 UNIT/ML injection Inject 40 Units into the skin daily.   ketoconazole (NIZORAL) 2 %  cream APPLY TOPICALLY TWICE DAILY AS NEEDED FOR IRRITATION TO GROIN, FACE, AND BUTTOCKS   levothyroxine (SYNTHROID) 50 MCG tablet TAKE 1 TABLET BY MOUTH ONCE DAILY BEFORE BREAKFAST   losartan (COZAAR) 100 MG tablet Take 1 tablet by mouth once daily   Magnesium 250 MG TABS Take 250 mg by mouth as needed (swelling). Take when you take your Torsemide.   mometasone (ELOCON) 0.1 % lotion Apply topically daily.   nitroGLYCERIN (NITROSTAT) 0.4 MG SL tablet Place 1 tablet (0.4 mg total) under the tongue every 5 (five) minutes as needed for chest pain.   nystatin (MYCOSTATIN/NYSTOP) powder Apply 1 Application topically 3 (three) times daily.   omeprazole (PRILOSEC) 20 MG capsule Take 1 capsule by mouth once daily   potassium chloride (KLOR-CON M10) 10 MEQ tablet Take 10 mEq by mouth as needed.   spironolactone (ALDACTONE) 25 MG tablet Take 1 tablet (25 mg total) by mouth daily.   torsemide (DEMADEX) 20 MG tablet Take 1 tablet (20 mg total) by mouth daily.   [DISCONTINUED] fluticasone-salmeterol (ADVAIR) 100-50 MCG/ACT AEPB Inhale 1 puff into the lungs 2 (two) times daily. (Patient not taking: Reported on 11/23/2022)   [DISCONTINUED] sildenafil (VIAGRA) 50 MG tablet Take 1 tablet (50 mg total) by mouth daily as needed for erectile dysfunction. (Patient not taking: Reported on 11/23/2022)   No facility-administered medications prior to visit.    Review of Systems  Constitutional:  Negative for appetite change, chills and fever.  Respiratory:  Negative for chest tightness.   Cardiovascular:  Negative for chest pain.  Skin:  Positive for color  change and wound.   {Labs  Heme  Chem  Endocrine  Serology  Results Review (optional):23779}   Objective    BP (!) 156/60 (BP Location: Left Arm, Cuff Size: Large)   Pulse 84   Temp 97.9 F (36.6 C) (Oral)   Resp 20   Wt 219 lb 3.2 oz (99.4 kg)   BMI 34.33 kg/m  BP Readings from Last 3 Encounters:  11/23/22 (!) 156/60  11/16/22 (!) 171/59  11/10/22 (!)  154/68   Wt Readings from Last 3 Encounters:  11/23/22 219 lb 3.2 oz (99.4 kg)  11/16/22 220 lb 11.2 oz (100.1 kg)  11/10/22 216 lb 11.2 oz (98.3 kg)      Physical Exam  ***  No results found for any visits on 11/23/22.  Assessment & Plan     ***  No follow-ups on file.      {provider attestation***:1}   [name], MD  [facility] [phone] (phone) [phone] (fax)  [place]

## 2022-11-23 NOTE — Progress Notes (Deleted)
I,Zarya Lasseigne S Alasdair Kleve,acting as a Education administrator for [employer], MD.,have documented all relevant documentation on the behalf of [name], MD,as directed by  [name], MD while in the presence of [name], MD.     Established patient visit   Patient: Joseph [name]   DOB: June 28, 1925   89 y.o. Male  MRN: [id number] Visit Date: 11/23/2022  Today's healthcare provider: [name], MD   Chief Complaint  Patient presents with   Follow-up   Subjective    HPI  Follow up for skin tear of left hand  The patient was last seen for this 1 weeks ago. Changes made at last visit include cleaned and wrapped in coban. Continue to clean, use ABX cream PRN and keep wrapped.  He reports excellent compliance with treatment. He feels that condition is Improved. He is not having side effects.   -----------------------------------------------------------------------------------------   Medications: Outpatient Medications Prior to Visit  Medication Sig   atorvastatin (LIPITOR) 80 MG tablet Take 1 tablet (80 mg total) by mouth daily.   Cholecalciferol 125 MCG (5000 UT) capsule Take 5,000 Units by mouth at bedtime.   clobetasol cream (TEMOVATE) 8.14 % Apply 1 application topically 2 (two) times daily. As needed to hands and elbows   clopidogrel (PLAVIX) 75 MG tablet Take 1 tablet (75 mg total) by mouth daily.   Continuous Blood Gluc Receiver (FREESTYLE LIBRE 2 READER) DEVI Use 1 Device as directed   Continuous Blood Gluc Sensor (FREESTYLE LIBRE 14 DAY SENSOR) MISC USE AS DIRECTED EVERY 14 DAYS   hydrocortisone 2.5 % cream APPLY TOPICALLY TWICE DAILY AS NEEDED TO FACE, CREASE, GROIN, BUTT.   insulin aspart (NOVOLOG) 100 UNIT/ML injection Take from 14 - 34 units before meals, as directed. Max daily dose is 90 units.   insulin glargine (LANTUS) 100 UNIT/ML injection Inject 40 Units into the skin daily.   ketoconazole (NIZORAL) 2 %  cream APPLY TOPICALLY TWICE DAILY AS NEEDED FOR IRRITATION TO GROIN, FACE, AND BUTTOCKS   levothyroxine (SYNTHROID) 50 MCG tablet TAKE 1 TABLET BY MOUTH ONCE DAILY BEFORE BREAKFAST   losartan (COZAAR) 100 MG tablet Take 1 tablet by mouth once daily   Magnesium 250 MG TABS Take 250 mg by mouth as needed (swelling). Take when you take your Torsemide.   mometasone (ELOCON) 0.1 % lotion Apply topically daily.   nitroGLYCERIN (NITROSTAT) 0.4 MG SL tablet Place 1 tablet (0.4 mg total) under the tongue every 5 (five) minutes as needed for chest pain.   nystatin (MYCOSTATIN/NYSTOP) powder Apply 1 Application topically 3 (three) times daily.   omeprazole (PRILOSEC) 20 MG capsule Take 1 capsule by mouth once daily   potassium chloride (KLOR-CON M10) 10 MEQ tablet Take 10 mEq by mouth as needed.   spironolactone (ALDACTONE) 25 MG tablet Take 1 tablet (25 mg total) by mouth daily.   torsemide (DEMADEX) 20 MG tablet Take 1 tablet (20 mg total) by mouth daily.   [DISCONTINUED] fluticasone-salmeterol (ADVAIR) 100-50 MCG/ACT AEPB Inhale 1 puff into the lungs 2 (two) times daily. (Patient not taking: Reported on 11/23/2022)   [DISCONTINUED] sildenafil (VIAGRA) 50 MG tablet Take 1 tablet (50 mg total) by mouth daily as needed for erectile dysfunction. (Patient not taking: Reported on 11/23/2022)   No facility-administered medications prior to visit.    Review of Systems  Constitutional:  Negative for appetite change, chills and fever.  Respiratory:  Negative for chest tightness.   Cardiovascular:  Negative for chest pain.  Skin:  Positive for color  change and wound.    {Labs  Heme  Chem  Endocrine  Serology  Results Review (optional):23779}   Objective    BP (!) 156/60 (BP Location: Left Arm, Cuff Size: Large)   Pulse 84   Temp 97.9 F (36.6 C) (Oral)   Resp 20   Wt 219 lb 3.2 oz (99.4 kg)   BMI 34.33 kg/m  BP Readings from Last 3 Encounters:  11/23/22 (!) 156/60  11/16/22 (!) 171/59  11/10/22  (!) 154/68     Wt Readings from Last 3 Encounters:  11/23/22 219 lb 3.2 oz (99.4 kg)  11/16/22 220 lb 11.2 oz (100.1 kg)  11/10/22 216 lb 11.2 oz (98.3 kg)      Physical Exam  ***  No results found for any visits on 11/23/22.  Assessment & Plan   ***  No follow-ups on file.      {provider attestation***:1}   [name], MD  [facility] [phone] (phone) [phone] (fax)  [place]

## 2022-11-27 NOTE — Assessment & Plan Note (Signed)
Improving  Does not appear to have cellulitis  Advised to continue applying antibiotic ointment and alternate with vaseline application  Keeping the area covered with fabric bandages  Discussed return precautions including warmth, streaking of redness, increased pain or swelling

## 2022-11-28 ENCOUNTER — Other Ambulatory Visit: Payer: Self-pay | Admitting: Internal Medicine

## 2022-12-08 ENCOUNTER — Other Ambulatory Visit: Payer: Self-pay | Admitting: Cardiovascular Disease

## 2022-12-09 ENCOUNTER — Emergency Department: Payer: HMO

## 2022-12-09 ENCOUNTER — Inpatient Hospital Stay: Payer: HMO

## 2022-12-09 ENCOUNTER — Other Ambulatory Visit: Payer: Self-pay

## 2022-12-09 ENCOUNTER — Inpatient Hospital Stay
Admission: EM | Admit: 2022-12-09 | Discharge: 2022-12-14 | DRG: 065 | Disposition: A | Discharge status: Skilled Nursing Facility | Payer: HMO | Attending: Osteopathic Medicine | Admitting: Osteopathic Medicine

## 2022-12-09 DIAGNOSIS — I110 Hypertensive heart disease with heart failure: Secondary | ICD-10-CM | POA: Diagnosis present

## 2022-12-09 DIAGNOSIS — R29703 NIHSS score 3: Secondary | ICD-10-CM | POA: Diagnosis not present

## 2022-12-09 DIAGNOSIS — Z794 Long term (current) use of insulin: Secondary | ICD-10-CM | POA: Diagnosis not present

## 2022-12-09 DIAGNOSIS — I2510 Atherosclerotic heart disease of native coronary artery without angina pectoris: Secondary | ICD-10-CM | POA: Diagnosis present

## 2022-12-09 DIAGNOSIS — Z9181 History of falling: Secondary | ICD-10-CM | POA: Diagnosis not present

## 2022-12-09 DIAGNOSIS — R29704 NIHSS score 4: Secondary | ICD-10-CM | POA: Diagnosis not present

## 2022-12-09 DIAGNOSIS — I214 Non-ST elevation (NSTEMI) myocardial infarction: Secondary | ICD-10-CM | POA: Diagnosis not present

## 2022-12-09 DIAGNOSIS — I69398 Other sequelae of cerebral infarction: Secondary | ICD-10-CM | POA: Diagnosis not present

## 2022-12-09 DIAGNOSIS — E1151 Type 2 diabetes mellitus with diabetic peripheral angiopathy without gangrene: Secondary | ICD-10-CM | POA: Diagnosis not present

## 2022-12-09 DIAGNOSIS — Y92003 Bedroom of unspecified non-institutional (private) residence as the place of occurrence of the external cause: Secondary | ICD-10-CM

## 2022-12-09 DIAGNOSIS — E785 Hyperlipidemia, unspecified: Secondary | ICD-10-CM | POA: Diagnosis not present

## 2022-12-09 DIAGNOSIS — R200 Anesthesia of skin: Secondary | ICD-10-CM | POA: Diagnosis not present

## 2022-12-09 DIAGNOSIS — Z79899 Other long term (current) drug therapy: Secondary | ICD-10-CM

## 2022-12-09 DIAGNOSIS — M6281 Muscle weakness (generalized): Secondary | ICD-10-CM | POA: Diagnosis not present

## 2022-12-09 DIAGNOSIS — W1839XA Other fall on same level, initial encounter: Secondary | ICD-10-CM | POA: Diagnosis present

## 2022-12-09 DIAGNOSIS — I672 Cerebral atherosclerosis: Secondary | ICD-10-CM | POA: Diagnosis not present

## 2022-12-09 DIAGNOSIS — R531 Weakness: Secondary | ICD-10-CM | POA: Diagnosis not present

## 2022-12-09 DIAGNOSIS — R2689 Other abnormalities of gait and mobility: Secondary | ICD-10-CM | POA: Diagnosis not present

## 2022-12-09 DIAGNOSIS — I5042 Chronic combined systolic (congestive) and diastolic (congestive) heart failure: Secondary | ICD-10-CM | POA: Diagnosis not present

## 2022-12-09 DIAGNOSIS — Z87891 Personal history of nicotine dependence: Secondary | ICD-10-CM | POA: Diagnosis not present

## 2022-12-09 DIAGNOSIS — R001 Bradycardia, unspecified: Secondary | ICD-10-CM | POA: Diagnosis not present

## 2022-12-09 DIAGNOSIS — W19XXXA Unspecified fall, initial encounter: Secondary | ICD-10-CM

## 2022-12-09 DIAGNOSIS — I639 Cerebral infarction, unspecified: Principal | ICD-10-CM | POA: Diagnosis present

## 2022-12-09 DIAGNOSIS — Z741 Need for assistance with personal care: Secondary | ICD-10-CM | POA: Diagnosis not present

## 2022-12-09 DIAGNOSIS — I69351 Hemiplegia and hemiparesis following cerebral infarction affecting right dominant side: Secondary | ICD-10-CM | POA: Insufficient documentation

## 2022-12-09 DIAGNOSIS — R29702 NIHSS score 2: Secondary | ICD-10-CM | POA: Diagnosis not present

## 2022-12-09 DIAGNOSIS — Z947 Corneal transplant status: Secondary | ICD-10-CM

## 2022-12-09 DIAGNOSIS — Z951 Presence of aortocoronary bypass graft: Secondary | ICD-10-CM | POA: Diagnosis not present

## 2022-12-09 DIAGNOSIS — Z9049 Acquired absence of other specified parts of digestive tract: Secondary | ICD-10-CM

## 2022-12-09 DIAGNOSIS — I440 Atrioventricular block, first degree: Secondary | ICD-10-CM | POA: Diagnosis present

## 2022-12-09 DIAGNOSIS — G8191 Hemiplegia, unspecified affecting right dominant side: Secondary | ICD-10-CM | POA: Diagnosis not present

## 2022-12-09 DIAGNOSIS — E876 Hypokalemia: Secondary | ICD-10-CM | POA: Diagnosis present

## 2022-12-09 DIAGNOSIS — J449 Chronic obstructive pulmonary disease, unspecified: Secondary | ICD-10-CM | POA: Diagnosis present

## 2022-12-09 DIAGNOSIS — Z823 Family history of stroke: Secondary | ICD-10-CM

## 2022-12-09 DIAGNOSIS — I6623 Occlusion and stenosis of bilateral posterior cerebral arteries: Secondary | ICD-10-CM | POA: Diagnosis not present

## 2022-12-09 DIAGNOSIS — Z7401 Bed confinement status: Secondary | ICD-10-CM | POA: Diagnosis not present

## 2022-12-09 DIAGNOSIS — E669 Obesity, unspecified: Secondary | ICD-10-CM | POA: Diagnosis not present

## 2022-12-09 DIAGNOSIS — I6389 Other cerebral infarction: Secondary | ICD-10-CM | POA: Diagnosis not present

## 2022-12-09 DIAGNOSIS — L409 Psoriasis, unspecified: Secondary | ICD-10-CM | POA: Diagnosis present

## 2022-12-09 DIAGNOSIS — E039 Hypothyroidism, unspecified: Secondary | ICD-10-CM | POA: Diagnosis not present

## 2022-12-09 DIAGNOSIS — R278 Other lack of coordination: Secondary | ICD-10-CM | POA: Diagnosis not present

## 2022-12-09 DIAGNOSIS — R29898 Other symptoms and signs involving the musculoskeletal system: Secondary | ICD-10-CM | POA: Diagnosis not present

## 2022-12-09 DIAGNOSIS — R29701 NIHSS score 1: Secondary | ICD-10-CM | POA: Diagnosis not present

## 2022-12-09 DIAGNOSIS — I6329 Cerebral infarction due to unspecified occlusion or stenosis of other precerebral arteries: Secondary | ICD-10-CM | POA: Diagnosis not present

## 2022-12-09 DIAGNOSIS — R29705 NIHSS score 5: Secondary | ICD-10-CM | POA: Diagnosis not present

## 2022-12-09 DIAGNOSIS — R4189 Other symptoms and signs involving cognitive functions and awareness: Secondary | ICD-10-CM | POA: Diagnosis not present

## 2022-12-09 DIAGNOSIS — Z8249 Family history of ischemic heart disease and other diseases of the circulatory system: Secondary | ICD-10-CM

## 2022-12-09 DIAGNOSIS — Z043 Encounter for examination and observation following other accident: Secondary | ICD-10-CM | POA: Diagnosis not present

## 2022-12-09 DIAGNOSIS — I10 Essential (primary) hypertension: Secondary | ICD-10-CM | POA: Diagnosis not present

## 2022-12-09 DIAGNOSIS — Z79890 Hormone replacement therapy (postmenopausal): Secondary | ICD-10-CM | POA: Diagnosis not present

## 2022-12-09 DIAGNOSIS — I4430 Unspecified atrioventricular block: Secondary | ICD-10-CM | POA: Diagnosis not present

## 2022-12-09 DIAGNOSIS — K219 Gastro-esophageal reflux disease without esophagitis: Secondary | ICD-10-CM | POA: Diagnosis not present

## 2022-12-09 DIAGNOSIS — T1490XA Injury, unspecified, initial encounter: Secondary | ICD-10-CM | POA: Diagnosis not present

## 2022-12-09 DIAGNOSIS — S0990XA Unspecified injury of head, initial encounter: Secondary | ICD-10-CM | POA: Diagnosis not present

## 2022-12-09 DIAGNOSIS — Z6832 Body mass index (BMI) 32.0-32.9, adult: Secondary | ICD-10-CM

## 2022-12-09 DIAGNOSIS — Z888 Allergy status to other drugs, medicaments and biological substances status: Secondary | ICD-10-CM

## 2022-12-09 DIAGNOSIS — Z7902 Long term (current) use of antithrombotics/antiplatelets: Secondary | ICD-10-CM

## 2022-12-09 DIAGNOSIS — I6601 Occlusion and stenosis of right middle cerebral artery: Secondary | ICD-10-CM | POA: Diagnosis not present

## 2022-12-09 LAB — CBC
HCT: 42 % (ref 39.0–52.0)
Hemoglobin: 14.4 g/dL (ref 13.0–17.0)
MCH: 30.5 pg (ref 26.0–34.0)
MCHC: 34.3 g/dL (ref 30.0–36.0)
MCV: 89 fL (ref 80.0–100.0)
Platelets: 204 10*3/uL (ref 150–400)
RBC: 4.72 MIL/uL (ref 4.22–5.81)
RDW: 13.4 % (ref 11.5–15.5)
WBC: 6.5 10*3/uL (ref 4.0–10.5)
nRBC: 0 % (ref 0.0–0.2)

## 2022-12-09 LAB — BASIC METABOLIC PANEL
Anion gap: 10 (ref 5–15)
BUN: 16 mg/dL (ref 8–23)
CO2: 24 mmol/L (ref 22–32)
Calcium: 8.8 mg/dL — ABNORMAL LOW (ref 8.9–10.3)
Chloride: 104 mmol/L (ref 98–111)
Creatinine, Ser: 1.03 mg/dL (ref 0.61–1.24)
GFR, Estimated: >60 mL/min (ref >60)
Glucose, Bld: 242 mg/dL — ABNORMAL HIGH (ref 70–99)
Potassium: 3.3 mmol/L — ABNORMAL LOW (ref 3.5–5.1)
Sodium: 138 mmol/L (ref 135–145)

## 2022-12-09 LAB — GLUCOSE, CAPILLARY: Glucose-Capillary: 192 mg/dL — ABNORMAL HIGH (ref 70–99)

## 2022-12-09 LAB — CBG MONITORING, ED
Glucose-Capillary: 236 mg/dL — ABNORMAL HIGH (ref 70–99)
Glucose-Capillary: 185 mg/dL — ABNORMAL HIGH (ref 70–99)

## 2022-12-09 MED ORDER — NITROGLYCERIN 0.4 MG SL SUBL: 0.4000 mg | SUBLINGUAL_TABLET | SUBLINGUAL | Status: DC | PRN

## 2022-12-09 MED ORDER — INSULIN ASPART 100 UNIT/ML IJ SOLN
0.0000 [IU] | Freq: Three times a day (TID) | INTRAMUSCULAR | Status: DC
  Administered 2022-12-09: 3 [IU] via SUBCUTANEOUS
  Administered 2022-12-10: 8 [IU] via SUBCUTANEOUS
  Administered 2022-12-10 (×2): 5 [IU] via SUBCUTANEOUS
  Administered 2022-12-11: 8 [IU] via SUBCUTANEOUS
  Administered 2022-12-11 (×2): 5 [IU] via SUBCUTANEOUS
  Administered 2022-12-12: 2 [IU] via SUBCUTANEOUS
  Administered 2022-12-12 (×2): 5 [IU] via SUBCUTANEOUS
  Administered 2022-12-13: 8 [IU] via SUBCUTANEOUS
  Administered 2022-12-13: 3 [IU] via SUBCUTANEOUS
  Administered 2022-12-13: 5 [IU] via SUBCUTANEOUS
  Administered 2022-12-14 (×2): 3 [IU] via SUBCUTANEOUS
  Filled 2022-12-09 (×13): qty 1

## 2022-12-09 MED ORDER — ONDANSETRON HCL 4 MG/2ML IJ SOLN: 4.0000 mg | Freq: Four times a day (QID) | INTRAMUSCULAR | Status: DC | PRN

## 2022-12-09 MED ORDER — ACETAMINOPHEN 160 MG/5ML PO SOLN: 650.0000 mg | ORAL | Status: DC | PRN

## 2022-12-09 MED ORDER — ASPIRIN 81 MG PO CHEW
324.0000 mg | CHEWABLE_TABLET | Freq: Once | ORAL | Status: AC
  Administered 2022-12-09: 324 mg via ORAL
  Filled 2022-12-09: qty 4

## 2022-12-09 MED ORDER — SENNOSIDES-DOCUSATE SODIUM 8.6-50 MG PO TABS: 1.0000 | ORAL_TABLET | Freq: Every evening | ORAL | Status: DC | PRN

## 2022-12-09 MED ORDER — PANTOPRAZOLE SODIUM 40 MG PO TBEC
40.0000 mg | DELAYED_RELEASE_TABLET | Freq: Every day | ORAL | Status: DC
  Administered 2022-12-09 – 2022-12-14 (×6): 40 mg via ORAL
  Filled 2022-12-09 (×6): qty 1

## 2022-12-09 MED ORDER — LEVOTHYROXINE SODIUM 50 MCG PO TABS
50.0000 ug | ORAL_TABLET | Freq: Every day | ORAL | Status: DC
  Administered 2022-12-10 – 2022-12-14 (×5): 50 ug via ORAL
  Filled 2022-12-09 (×5): qty 1

## 2022-12-09 MED ORDER — POTASSIUM CHLORIDE CRYS ER 10 MEQ PO TBCR
10.0000 meq | EXTENDED_RELEASE_TABLET | Freq: Every day | ORAL | Status: DC
  Administered 2022-12-09 – 2022-12-14 (×6): 10 meq via ORAL
  Filled 2022-12-09 (×6): qty 1

## 2022-12-09 MED ORDER — INSULIN ASPART 100 UNIT/ML IJ SOLN
0.0000 [IU] | Freq: Every day | INTRAMUSCULAR | Status: DC
  Administered 2022-12-10 – 2022-12-12 (×2): 2 [IU] via SUBCUTANEOUS
  Filled 2022-12-09 (×2): qty 1

## 2022-12-09 MED ORDER — ASPIRIN 81 MG PO TBEC
81.0000 mg | DELAYED_RELEASE_TABLET | Freq: Every day | ORAL | Status: DC
  Administered 2022-12-10 – 2022-12-14 (×5): 81 mg via ORAL
  Filled 2022-12-09 (×5): qty 1

## 2022-12-09 MED ORDER — TORSEMIDE 20 MG PO TABS
20.0000 mg | ORAL_TABLET | Freq: Every day | ORAL | Status: DC
  Administered 2022-12-09 – 2022-12-14 (×5): 20 mg via ORAL
  Filled 2022-12-09 (×6): qty 1

## 2022-12-09 MED ORDER — ENOXAPARIN SODIUM 60 MG/0.6ML IJ SOSY
0.5000 mg/kg | PREFILLED_SYRINGE | INTRAMUSCULAR | Status: DC
  Administered 2022-12-09 – 2022-12-13 (×5): 50 mg via SUBCUTANEOUS
  Filled 2022-12-09 (×5): qty 0.6

## 2022-12-09 MED ORDER — STROKE: EARLY STAGES OF RECOVERY BOOK: Freq: Once | Status: AC

## 2022-12-09 MED ORDER — ATORVASTATIN CALCIUM 20 MG PO TABS
80.0000 mg | ORAL_TABLET | Freq: Every day | ORAL | Status: DC
  Administered 2022-12-09 – 2022-12-13 (×5): 80 mg via ORAL
  Filled 2022-12-09 (×5): qty 4

## 2022-12-09 MED ORDER — ACETAMINOPHEN 650 MG RE SUPP: 650.0000 mg | RECTAL | Status: DC | PRN

## 2022-12-09 MED ORDER — IOHEXOL 350 MG/ML SOLN
75.0000 mL | Freq: Once | INTRAVENOUS | Status: AC | PRN
  Administered 2022-12-09: 75 mL via INTRAVENOUS

## 2022-12-09 MED ORDER — CLOPIDOGREL BISULFATE 75 MG PO TABS: 75.0000 mg | ORAL_TABLET | Freq: Every day | ORAL | Status: DC

## 2022-12-09 MED ORDER — ACETAMINOPHEN 325 MG PO TABS
650.0000 mg | ORAL_TABLET | ORAL | Status: DC | PRN
  Filled 2022-12-09: qty 2

## 2022-12-09 MED ORDER — INSULIN GLARGINE-YFGN 100 UNIT/ML ~~LOC~~ SOLN
40.0000 [IU] | Freq: Every day | SUBCUTANEOUS | Status: DC
  Administered 2022-12-10: 40 [IU] via SUBCUTANEOUS
  Filled 2022-12-09 (×2): qty 0.4

## 2022-12-09 MED ORDER — CLOPIDOGREL BISULFATE 75 MG PO TABS
75.0000 mg | ORAL_TABLET | Freq: Every day | ORAL | Status: DC
  Administered 2022-12-09 – 2022-12-14 (×6): 75 mg via ORAL
  Filled 2022-12-09 (×6): qty 1

## 2022-12-09 NOTE — ED Notes (Signed)
Patient arrives to room 37 with daughter at bedside. Patient denies any needs at this time, daughter assisting patient to eat dinner.

## 2022-12-09 NOTE — ED Notes (Signed)
See triage note  Presents with family via EMS s/p fall  States he tried to get up and his legs were weak  He fell   Having pain to right leg  Hx of sciatica per family

## 2022-12-09 NOTE — ED Triage Notes (Signed)
Pt to ED via [facility] from home. Pt reports was getting out of bed and felt weaker on his left leg and fell out of bed. Pt was found on floor by caregiver. CBG 186. BP 194/98

## 2022-12-09 NOTE — ED Provider Notes (Signed)
[facility] Provider Note    Event Date/Time   First MD Initiated Contact with Patient 12/09/22 1208     (approximate)   History   Weakness   HPI  Joseph [name] is a 89 y.o. male with past medical history of diastolic heart failure, diabetes, coronary disease, hypertension who presents with weakness.  Patient woke up this morning and went to get out of bed when he felt like he could not move his right leg and he fell to the ground hitting his head.  Continues to feel like he cannot move the right leg denies numbness.  Says the right arm feels somewhat strange and a little bit heavy but not numb or weak.  Denies vision change or difficulty speaking.  He does have a history of low back pain but is denying any pain currently denies any new pain.  Denies bowel bladder incontinence.  Has not been able to ambulate since.     Past Medical History:  Diagnosis Date   Acute respiratory failure with hypoxia ([place]) 03/21/2016   CAD (coronary artery disease)    a. s/p CABG;  b. 07/2012 Cath: 3VD including 80 dLCX (med Rx), 4/4 patent grafts.   Chronic diastolic CHF (congestive heart failure) ([place])    a. 07/2012 Echo: EF 55-60%, no rwma, Gr 1 DD, mild MR;  04/2014 Echo: EF 55-60%, no rwma, mild MR, mildly dil LA.   Diabetes mellitus, type 2 ([place])    HTN (hypertension)    Hyperlipidemia    Hypothyroidism    Lower extremity edema    a. 03/2014 amlodipine d/c'd.   Spinal stenosis     Patient Active Problem List   Diagnosis Date Noted   Skin tear of left hand without complication 02/54/2706   Pain of left hip 11/16/2022   Left hip pain 11/10/2022   Intertrigo 11/10/2022   COPD with acute exacerbation ([place]) 10/06/2022   Enlarged prostate 03/02/2022   Other specified counseling 03/02/2022   Renal calculi 03/02/2022   Type 2 diabetes mellitus without complication ([place]) 23/76/2831   Gastroesophageal reflux disease 03/02/2022   Hypertensive disorder 03/02/2022   Mixed  hyperlipidemia 03/02/2022   Hypothyroidism 03/02/2022   Acute non-recurrent frontal sinusitis 01/05/2022   Glands swollen 01/05/2022   Hypertension associated with diabetes ([place]) 01/05/2022   Acute rhinitis 01/05/2022   AV block, [place] 1    NSTEMI (non-ST elevated myocardial infarction) ([place])    ACS (acute coronary syndrome) ([place]) 12/16/2019   Diverticulitis of large intestine without perforation or abscess without bleeding    Pain of upper abdomen    SOB (shortness of breath)    S/P CABG (coronary artery bypass graft)    Morbid obesity due to excess calories (HCC)    Back pain, chronic 07/28/2015   Benign fibroma of prostate 07/28/2015   Acid reflux 07/28/2015   Adult hypothyroidism 07/28/2015   Eunuchoidism 07/28/2015   Neuropathy 07/28/2015   Arthritis, degenerative 07/28/2015   Psoriasis 07/28/2015   Lumbar spondylosis 05/08/2015   Obesity 09/09/2014   Bilateral leg weakness 09/09/2014   Acute on chronic combined systolic and diastolic HF (heart failure) ([place]) 05/13/2014   Bilateral leg edema 04/15/2014   Bradycardia 08/13/2013   Edema 06/21/2012   Fatigue 09/15/2011   Type 2 diabetes mellitus with diabetic peripheral angiopathy without gangrene, with long-term current use of insulin ([place]) 09/15/2011   DYSPNEA 06/09/2009   Hyperlipidemia 06/07/2009   Essential hypertension 06/07/2009   Coronary artery disease 06/07/2009  Physical Exam  Triage Vital Signs: ED Triage Vitals  Enc Vitals Group     BP 12/09/22 0956 (!) 165/111     Pulse Rate 12/09/22 0956 73     Resp 12/09/22 0956 16     Temp 12/09/22 0956 97.8 F (36.6 C)     Temp Source 12/09/22 1315 Oral     SpO2 12/09/22 0956 97 %     Weight 12/09/22 0956 220 lb (99.8 kg)     Height 12/09/22 0956 '5\' 7"'$  (1.702 m)     Head Circumference --      Peak Flow --      Pain Score 12/09/22 1315 4     Pain Loc --      Pain Edu? --      Excl. in [place]? --     Most recent vital signs: Vitals:   12/09/22 1315  12/09/22 1319  BP: (!) 160/108   Pulse: 70   Resp:    Temp: 98 F (36.7 C) 98.2 F (36.8 C)  SpO2: 98%      General: Awake, no distress.  CV:  Good peripheral perfusion.  Resp:  Normal effort.  Abd:  No distention.  Neuro:             Awake, Alert, Oriented x 3  Other:  Aox3, nml speech  PERRL, EOMI, face symmetric, nml tongue movement  Slight pronator drift in RUE, nml grip BL upper extremities  4/5 strength with hip flexion R, 5/5 L, 5/5 knee flexion, extension, plantar flexion and dorsiflexion BL lower extremities   Sensation grossly intact in the BL upper and lower extremities  Finger-nose-finger intact BL  No midline C, T or L spine ttp  ED Results / Procedures / Treatments  Labs (all labs ordered are listed, but only abnormal results are displayed) Labs Reviewed  BASIC METABOLIC PANEL - Abnormal; Notable for the following components:      Result Value   Potassium 3.3 (*)    Glucose, Bld 242 (*)    Calcium 8.8 (*)    All other components within normal limits  CBG MONITORING, ED - Abnormal; Notable for the following components:   Glucose-Capillary 236 (*)    All other components within normal limits  CBC  URINALYSIS, ROUTINE W REFLEX MICROSCOPIC     EKG  EKG shows sinus rhythm with left bundle branch block PVCs, ST depression T wave inversion V4 through V6   RADIOLOGY I reviewed and interpreted the CT scan of the brain which does not show any acute intracranial process    PROCEDURES:  Critical Care performed: No  Procedures   MEDICATIONS ORDERED IN ED: Medications  aspirin chewable tablet 324 mg (has no administration in time range)     IMPRESSION / MDM / ASSESSMENT AND PLAN / ED COURSE  I reviewed the triage vital signs and the nursing notes.                              Patient's presentation is most consistent with acute presentation with potential threat to life or bodily function.  Differential diagnosis includes, but is not limited  to, CVA, thoracic or lumbar myelopathy, peripheral neuropathy  The patient is a 89 year old male presents with right lower extremity weakness.  Patient noticed right leg weakness when he got out of bed today and fell as a result.  Also feels like right arm is somewhat heavy.  No back  pain no bowel bladder symptoms.  Patient is hypertensive.  On exam he does have some subtle right pronator drift and decreased hip flexion strength about 4 out of 5 on the right and he is unable to ambulate.  The rest of his neurologic exam is nonfocal.  CT head negative.  Labs overall reassuring.  And concern for CVA versus primary thoracic or lumbar pathology so we will obtain MRIs.  MRI brain shows acute stroke of the left anterior choroidal artery.  This corresponds with patient's symptoms.  He is on Plavix we will give him full dose aspirin admit to the hospitalist.     FINAL CLINICAL IMPRESSION(S) / ED DIAGNOSES   Final diagnoses:  Acute CVA (cerebrovascular accident) [facility])     Rx / DC Orders   ED Discharge Orders     None        Note:  This document was prepared using Dragon voice recognition software and may include unintentional dictation errors.   Rada Hay, MD 12/09/22 [phone]

## 2022-12-09 NOTE — ED Notes (Signed)
Pt resting comfortably with family at bedside. Pt successfully passed swallow screen. Pt request urinal.

## 2022-12-09 NOTE — ED Notes (Signed)
Pt gone to CT 

## 2022-12-09 NOTE — Consult Note (Incomplete)
Neurology Consultation Reason for Consult: *** Requesting Physician: ***  CC: ***  History is obtained from:***  HPI: Joseph [name] is a 89 y.o. male ***   LKW: *** Thrombolytic given?: No, or if yes, time given ***  Checklist of contraindications was reviewed and negative. Risks, benefits and alternatives were discussed *** IA performed?: No, or if yes, groin puncture time: *** Premorbid modified rankin scale: ***     0 - No symptoms.     1 - No significant disability. Able to carry out all usual activities, despite some symptoms.     2 - Slight disability. Able to look after own affairs without assistance, but unable to carry out all previous activities.     3 - Moderate disability. Requires some help, but able to walk unassisted.     4 - Moderately severe disability. Unable to attend to own bodily needs without assistance, and unable to walk unassisted.     5 - Severe disability. Requires constant nursing care and attention, bedridden, incontinent.     6 - Dead. ICH Score: ***  Time performed: *** GCS: {Blank single:19197::"3-4 is 2 points","5-12 is 1 point","13-15 is 0 points"} Infratentorial: {yes no:314532}. If yes, 1 point Volume: {Blank single:19197::">30cc is 1 point","<30cc is 0 points"}  Age: 89 y.o.. >80 is 1 point Intraventricular extension is 1 point  Score:***  A Score of {Blank single:19197::"0 points has a 30 day mortality of 0%","1 points has a 30 day mortality of 13%","2 points has a 30 day mortality of 26%","3 points has a 30 day mortality of 72%","4 points has a 30 day mortality of 97%","5-6 points has a 30 day mortality of 100%"}. Stroke. 2001 Apr;32(4):891-7.    ROS: All other review of systems was negative except as noted in the HPI. *** Unable to obtain due to altered mental status.   Past Medical History:  Diagnosis Date   Acute respiratory failure with hypoxia ([place]) 03/21/2016   CAD (coronary artery disease)    a. s/p CABG;  b. 07/2012 Cath: 3VD  including 80 dLCX (med Rx), 4/4 patent grafts.   Chronic diastolic CHF (congestive heart failure) ([place])    a. 07/2012 Echo: EF 55-60%, no rwma, Gr 1 DD, mild MR;  04/2014 Echo: EF 55-60%, no rwma, mild MR, mildly dil LA.   Diabetes mellitus, type 2 (HCC)    HTN (hypertension)    Hyperlipidemia    Hypothyroidism    Lower extremity edema    a. 03/2014 amlodipine d/c'd.   Spinal stenosis    ***  Family History  Problem Relation Age of Onset   Heart failure Mother    Heart attack Father    Stroke Brother    Heart failure Maternal Uncle    Heart failure Maternal Grandfather    Sleep apnea Daughter    Sleep apnea Son    Kidney Stones Son    ***  Social History:  reports that he quit smoking about 41 years ago. His smoking use included pipe, cigars, and cigarettes. He has never used smokeless tobacco. He reports that he does not drink alcohol and does not use drugs. ***  Exam: Current vital signs: BP (!) 157/66   Pulse 77   Temp 98.2 F (36.8 C) (Oral)   Resp 18   Ht '5\' 7"'$  (1.702 m)   Wt 99.8 kg   SpO2 98%   BMI 34.46 kg/m  Vital signs in last 24 hours: Temp:  [97.8 F (36.6 C)-98.2 F (36.8 C)]  98.2 F (36.8 C) (01/19 1319) Pulse Rate:  [70-77] 77 (01/19 1500) Resp:  [16-18] 18 (01/19 1500) BP: (157-165)/(66-111) 157/66 (01/19 1500) SpO2:  [97 %-98 %] 98 % (01/19 1500) Weight:  [99.8 kg] 99.8 kg (01/19 0956)   Physical Exam  Constitutional: Appears well-developed and well-nourished.  Psych: Affect appropriate to situation, *** Eyes: No scleral injection HENT: No oropharyngeal obstruction.  MSK: no joint deformities.  Cardiovascular: Normal rate and regular rhythm. *** Perfusing extremities well Respiratory: Effort normal, non-labored breathing GI: Soft.  No distension. There is no tenderness.  Skin: Warm dry and intact visible skin  Neuro: Mental Status: Patient is awake, alert, oriented to person, place, month, year, and situation.*** Patient is able to give  a clear and coherent history.*** No signs of aphasia or neglect*** Cranial Nerves: II: Visual Fields are full. Pupils are equal, round, and reactive to light.  *** III,IV, VI: EOMI without ptosis or diploplia.  V: Facial sensation is symmetric to temperature VII: Facial movement is symmetric.  VIII: hearing is intact to voice X: Uvula elevates symmetrically XI: Shoulder shrug is symmetric. XII: tongue is midline without atrophy or fasciculations.  Motor: Tone is normal. Bulk is normal. 5/5 strength was present in all four extremities. *** Sensory: Sensation is symmetric to light touch and temperature in the arms and legs.*** Deep Tendon Reflexes: 2+ and symmetric in the brachioradialis and patellae. *** Plantars: Toes are downgoing bilaterally. *** Cerebellar: FNF and HKS are intact bilaterally*** Gait:  Deferred in acute setting ***  NIHSS total *** Score breakdown: *** Performed at *** time of patient arrival to ED    I have reviewed labs in epic and the results pertinent to this consultation are: ***  I have reviewed the images obtained:***   Impression: ***  Recommendations: # *** stroke - Stroke labs TSH, ESR, RPR, HgbA1c, fasting lipid panel - MRI brain  - CTA or MRA of the brain without contrast and Carotid duplex or MRA neck w/wo  - Frequent neuro checks - Echocardiogram - Prophylactic therapy-Antiplatelet med: Aspirin - dose '325mg'$  PO or '300mg'$  PR, followed by 81 mg daily - Consider Plavix 300 mg load with 75 mg daily for 21 - 90 day course  - Risk factor modification - Telemetry monitoring; 30 day event monitor on discharge if no arrythmias captured  - Blood pressure goal ***  - Normotension  - Management of hypertensive emergency/urgency per standard protocols  - Permissive hypertension to 220/120 due to acute stroke  - Post tPA for 24  hours < 180/105  - Post successful uncomplicated revascularization SBP 120 - 140 for 24 hours; if complications have  arisen or only partial revascularization reach out to interventionalist or neurologist on call for BP goal - PT consult, OT consult, Speech consult, unless patient is back to baseline - Stroke team to follow    Joseph Noe MD-PhD [facility] [phone] [facility] coverage for [facility] is from 8 AM to 4 AM in-house and 4 PM to 8 PM by telephone/video. 8 PM to 8 AM emergent questions or overnight urgent questions should be addressed to Teleneurology On-call or [facility] neurohospitalist; contact information can be found on AMION

## 2022-12-09 NOTE — H&P (Signed)
History and Physical  Joseph [name] GXQ:[id number] DOB: 07-Dec-1924 DOA: 12/09/2022   PCP: [name], MD   Patient coming from: Home   Chief Complaint: Fall at home, Right leg weakness   HPI: Joseph [name] is a 89 y.o. male with medical history significant for hypertension, hyperlipidemia, insulin-dependent type 2 diabetes, coronary artery disease status post CABG about 40 years ago who is being admitted to the hospitalist service for evaluation and management of acute stroke.  History is provided by the patient, he states that he was in his usual state of health until this morning, when he woke up, went to stand up at the side of his bed and fell to the ground due to right leg weakness.  He is having spells, but denies chest pain, dizziness, palpitations, loss of consciousness.  He denies being sick recently, specifically denies any recent fevers, chills, nausea, vomiting, new or adjustments to doses of his medications.  Patient lives on his own, but has daily caregivers, he was found on the floor this morning by his caregiver, who called EMS.  Patient was brought to the emergency department for evaluation, where he had noncontrast head CT, as well as MRI of the lumbar and thoracic spine given his fall.  MRI of the brain revealed evidence of acute stroke in the left anterior choroidal artery distribution.  Patient was given a full dose aspirin, hospitalist was contacted for admission.  In the meantime, he has passed nursing bedside swallow evaluation.  His son is at the bedside in the ER as well.  Review of Systems: Please see HPI for pertinent positives and negatives. A complete 10 system review of systems are otherwise negative.  Past Medical History:  Diagnosis Date   Acute respiratory failure with hypoxia ([place]) 03/21/2016   CAD (coronary artery disease)    a. s/p CABG;  b. 07/2012 Cath: 3VD including 80 dLCX (med Rx), 4/4 patent grafts.   Chronic diastolic CHF  (congestive heart failure) ([place])    a. 07/2012 Echo: EF 55-60%, no rwma, Gr 1 DD, mild MR;  04/2014 Echo: EF 55-60%, no rwma, mild MR, mildly dil LA.   Diabetes mellitus, type 2 ([place])    HTN (hypertension)    Hyperlipidemia    Hypothyroidism    Lower extremity edema    a. 03/2014 amlodipine d/c'd.   Spinal stenosis    Past Surgical History:  Procedure Laterality Date   APPENDECTOMY     CARDIAC CATHETERIZATION  07/2012   [facility]/no stents   CHOLECYSTECTOMY     CORNEAL TRANSPLANT     CORONARY ARTERY BYPASS GRAFT  1994   HEMORRHOID SURGERY     LEFT HEART CATH AND CORS/GRAFTS ANGIOGRAPHY N/A 12/17/2019   Procedure: LEFT HEART CATH AND CORS/GRAFTS ANGIOGRAPHY;  Surgeon: Nelva Bush, MD;  Location: [facility];  Service: Cardiovascular;  Laterality: N/A;   PROSTATE BIOPSY     TOE SURGERY     ingrown toe nail    Social History:  reports that he quit smoking about 41 years ago. His smoking use included pipe, cigars, and cigarettes. He has never used smokeless tobacco. He reports that he does not drink alcohol and does not use drugs.   Allergies  Allergen Reactions   Farxiga [Dapagliflozin] Rash    Genital rash    Family History  Problem Relation Age of Onset   Heart failure Mother    Heart attack Father    Stroke Brother    Heart failure Maternal Uncle  Heart failure Maternal Grandfather    Sleep apnea Daughter    Sleep apnea Son    Kidney Stones Son      Prior to Admission medications   Medication Sig Start Date End Date Taking? Authorizing Provider  atorvastatin (LIPITOR) 80 MG tablet Take 1 tablet by mouth once daily 11/28/22  Yes Gollan, Kathlene November, MD  Cholecalciferol 125 MCG (5000 UT) capsule Take 5,000 Units by mouth at bedtime.   Yes [provider]  clobetasol cream (TEMOVATE) 2.68 % Apply 1 application topically 2 (two) times daily. As needed to hands and elbows   Yes [provider]  clopidogrel (PLAVIX) 75 MG tablet Take 1 tablet by mouth  once daily 12/08/22  Yes Gollan, Kathlene November, MD  hydrocortisone 2.5 % cream APPLY TOPICALLY TWICE DAILY AS NEEDED TO FACE, CREASE, GROIN, BUTT. 03/28/22  Yes [name]., MD  insulin aspart (NOVOLOG) 100 UNIT/ML injection Take from 14 - 34 units before meals, as directed. Max daily dose is 90 units. 12/22/21  Yes [provider]  insulin glargine (LANTUS) 100 UNIT/ML injection Inject 40 Units into the skin daily.   Yes [provider]  ketoconazole (NIZORAL) 2 % cream APPLY TOPICALLY TWICE DAILY AS NEEDED FOR IRRITATION TO GROIN, FACE, AND BUTTOCKS 01/06/21  Yes [name]., MD  levothyroxine (SYNTHROID) 50 MCG tablet TAKE 1 TABLET BY MOUTH ONCE DAILY BEFORE BREAKFAST 12/15/20  Yes [name]., MD  losartan (COZAAR) 100 MG tablet Take 1 tablet by mouth once daily 10/03/22  Yes Gollan, Kathlene November, MD  Magnesium 250 MG TABS Take 250 mg by mouth as needed (swelling). Take when you take your Torsemide.   Yes [provider]  nitroGLYCERIN (NITROSTAT) 0.4 MG SL tablet Place 1 tablet (0.4 mg total) under the tongue every 5 (five) minutes as needed for chest pain. 09/30/21  Yes [name]., MD  omeprazole (PRILOSEC) 20 MG capsule Take 1 capsule by mouth once daily 08/10/22  Yes [name]., MD  potassium chloride (KLOR-CON M10) 10 MEQ tablet Take 10 mEq by mouth as needed. 05/17/16  Yes [provider]  torsemide (DEMADEX) 20 MG tablet Take 1 tablet (20 mg total) by mouth daily. 04/19/22  Yes Furth, Cadence H, PA-C  Continuous Blood Gluc Receiver (FREESTYLE LIBRE 2 READER) DEVI Use 1 Device as directed 02/04/22   [provider]  Continuous Blood Gluc Sensor (FREESTYLE LIBRE 14 DAY SENSOR) MISC USE AS DIRECTED EVERY 14 DAYS 01/16/20   [name]., MD  nystatin (MYCOSTATIN/NYSTOP) powder Apply 1 Application topically 3 (three) times daily. Patient not taking: Reported on 12/09/2022 11/10/22   Simmons-Robinson,  Riki Sheer, MD  spironolactone (ALDACTONE) 25 MG tablet Take 1 tablet (25 mg total) by mouth daily. Patient not taking: Reported on 12/09/2022 06/08/22   Minna Merritts, MD   Physical Exam: BP (!) 157/66   Pulse 77   Temp 98.2 F (36.8 C) (Oral)   Resp 18   Ht '5\' 7"'$  (1.702 m)   Wt 99.8 kg   SpO2 98%   BMI 34.46 kg/m   General:  Alert, oriented, calm, in no acute distress  Eyes: EOMI, clear conjuctivae, white sclerea Neck: supple, no masses, trachea mildline  Cardiovascular: RRR, no murmurs or rubs, no peripheral edema  Respiratory: clear to auscultation bilaterally, no wheezes, no crackles  Abdomen: soft, nontender, nondistended, normal bowel tones heard  Skin: dry, no rashes  Musculoskeletal: no joint effusions,  normal range of motion  Psychiatric: appropriate affect, normal speech  Neurologic: extraocular muscles intact, clear speech, moving all extremities with intact sensorium, he has minimal weakness of right hip flexion         Labs on Admission:  Basic Metabolic Panel: Recent Labs  Lab 12/09/22 1040  NA 138  K 3.3*  CL 104  CO2 24  GLUCOSE 242*  BUN 16  CREATININE 1.03  CALCIUM 8.8*   Liver Function Tests: No results for input(s): "AST", "ALT", "ALKPHOS", "BILITOT", "PROT", "ALBUMIN" in the last 168 hours. No results for input(s): "LIPASE", "AMYLASE" in the last 168 hours. No results for input(s): "AMMONIA" in the last 168 hours. CBC: Recent Labs  Lab 12/09/22 1040  WBC 6.5  HGB 14.4  HCT 42.0  MCV 89.0  PLT 204   Cardiac Enzymes: No results for input(s): "CKTOTAL", "CKMB", "CKMBINDEX", "TROPONINI" in the last 168 hours.  BNP (last 3 results) No results for input(s): "BNP" in the last 8760 hours.  ProBNP (last 3 results) No results for input(s): "PROBNP" in the last 8760 hours.  CBG: Recent Labs  Lab 12/09/22 1216  GLUCAP 236*    Radiological Exams on Admission: MR THORACIC SPINE WO CONTRAST  Result Date: 12/09/2022 CLINICAL DATA:  Leg  weakness.  Right leg pain after fall. EXAM: MRI THORACIC SPINE WITHOUT CONTRAST TECHNIQUE: Multiplanar, multisequence MR imaging of the thoracic spine was performed. No intravenous contrast was administered. COMPARISON:  CT chest, abdomen, and pelvis dated Mar 21, 2016. FINDINGS: Alignment:  Physiologic. Vertebrae: No fracture, evidence of discitis, or bone lesion. Cord:  Normal signal and morphology. Paraspinal and other soft tissues: Negative. Disc levels: No significant disc bulge or herniation.  No stenosis. IMPRESSION: 1. No acute abnormality of the thoracic spine. Electronically Signed   By: Titus Dubin M.D.   On: 12/09/2022 14:44   MR BRAIN WO CONTRAST  Result Date: 12/09/2022 CLINICAL DATA:  Neuro deficit, acute, stroke suspected. EXAM: MRI HEAD WITHOUT CONTRAST TECHNIQUE: Multiplanar, multiecho pulse sequences of the brain and surrounding structures were obtained without intravenous contrast. COMPARISON:  Head CT 12/09/2022.  MRA head 07/27/2007. FINDINGS: Brain: Acute infarct in the left posterior limb of the internal capsule extending into the left corona radiata. Mild associated T2 hyperintensity. No acute hemorrhage. No mass effect or midline shift. No hydrocephalus. No extra-axial collection. Basilar cisterns are patent. Vascular: Normal flow voids. Skull and upper cervical spine: Normal marrow signal. Mild degenerative changes of the upper cervical spine. Sinuses/Orbits: Paranasal sinuses, mastoid air cells, and middle ear cavities are well aerated. Orbits are unremarkable. Other: None. IMPRESSION: Acute left anterior choroidal artery infarct with mild T2 hyperintensity. No acute hemorrhage or significant mass effect. Electronically Signed   By: Emmit Alexanders M.D.   On: 12/09/2022 14:42   MR LUMBAR SPINE WO CONTRAST  Result Date: 12/09/2022 CLINICAL DATA:  Leg weakness. Right leg pain after falling out of bed. EXAM: MRI LUMBAR SPINE WITHOUT CONTRAST TECHNIQUE: Multiplanar, multisequence  MR imaging of the lumbar spine was performed. No intravenous contrast was administered. COMPARISON:  CT chest, abdomen, and pelvis dated Mar 21, 2016. FINDINGS: Segmentation:  Standard. Alignment:  Physiologic. Vertebrae: No fracture, evidence of discitis, or bone lesion. Degenerative left-sided vertebral body marrow edema at L2. Degenerative right-sided endplate marrow edema at L5-S1. Conus medullaris and cauda equina: Conus extends to the L1-L2 level. Conus and cauda equina appear normal. Fatty filum terminale. Paraspinal and other soft tissues: Negative. Disc levels: T12-L1:  No significant disc bulge or  herniation.  No stenosis. L1-L2:  No significant disc bulge or herniation.  No stenosis. L2-L3:  Mild disc bulging.  No stenosis. L3-L4: Mild disc bulging. Mild to moderate spinal canal stenosis. No neuroforaminal stenosis. L4-L5: Mild disc bulging and bilateral facet arthropathy. Mild bilateral lateral recess stenosis. No spinal canal or neuroforaminal stenosis. L5-S1: Small broad-based posterior disc protrusion. Moderate bilateral facet arthropathy with ligamentum flavum hypertrophy. Mild left lateral recess stenosis. Mild-to-moderate bilateral neuroforaminal stenosis. IMPRESSION: 1. No acute abnormality. 2. Multilevel lumbar spondylosis as described above. Mild-to-moderate spinal canal stenosis at L3-L4. 3. Mild-to-moderate bilateral neuroforaminal stenosis at L5-S1. Electronically Signed   By: Titus Dubin M.D.   On: 12/09/2022 14:40   CT Cervical Spine Wo Contrast  Result Date: 12/09/2022 CLINICAL DATA:  Trauma EXAM: CT CERVICAL SPINE WITHOUT CONTRAST TECHNIQUE: Multidetector CT imaging of the cervical spine was performed without intravenous contrast. Multiplanar CT image reconstructions were also generated. RADIATION DOSE REDUCTION: This exam was performed according to the departmental dose-optimization program which includes automated exposure control, adjustment of the mA and/or kV according to  patient size and/or use of iterative reconstruction technique. COMPARISON:  None Available. FINDINGS: Reversed cervical lordosis identified without evidence of posterior element distraction or anterior translation consistent with muscle spasm or positioning. Degenerative disc disease noted at each cervical level with disc space narrowing and marginal osteophytes. Findings most severely involving C4-5 through C6-7. Osteoarthritis identified at C1-C2. Prevertebral and cervicocranial soft tissues are unremarkable. No compression deformities. No spondylolisthesis. No focal osteolytic or osteoblastic changes. IMPRESSION: Degenerative changes.  No acute traumatic abnormalities. Electronically Signed   By: Sammie Bench M.D.   On: 12/09/2022 11:32   CT HEAD WO CONTRAST  Result Date: 12/09/2022 CLINICAL DATA:  Head trauma, moderate-severe fall EXAM: CT HEAD WITHOUT CONTRAST TECHNIQUE: Contiguous axial images were obtained from the base of the skull through the vertex without intravenous contrast. RADIATION DOSE REDUCTION: This exam was performed according to the departmental dose-optimization program which includes automated exposure control, adjustment of the mA and/or kV according to patient size and/or use of iterative reconstruction technique. COMPARISON:  02/03/2017 FINDINGS: Brain: There is periventricular white matter decreased attenuation consistent with small vessel ischemic changes. Ventricles, sulci and cisterns are prominent consistent with age related involutional changes. No acute intracranial hemorrhage, mass effect or shift. No hydrocephalus. Vascular: No hyperdense vessel or unexpected calcification. Skull: Normal. Negative for fracture or focal lesion. Sinuses/Orbits: No acute finding. IMPRESSION: Atrophy and chronic small vessel ischemic changes. No acute intracranial process identified. Electronically Signed   By: Sammie Bench M.D.   On: 12/09/2022 11:30    Assessment/Plan Principal  Problem:   Acute cerebrovascular accident (CVA) ([place]) -acute left anterior choroidal artery stroke seen on MRI 1/19 -Inpatient admission, with acute CVA order set utilized -Continuous telemetry for the next 48 hours -Hold antihypertensives to allow for permissive hypertension -Patient was given aspirin in the emergency department, will continue home Plavix -Check 2D echo, CTA head and neck -Check fasting lipids, and hemoglobin A1c -Discussed with neurohospitalist Dr. Parke Simmers who will be kind enough to see the patient in the morning -Patient has past nurse bedside swallow screen, okay for diabetic diet -PT/OT/SLP evaluations, anticipate elevated benefit from skilled therapy at discharge    Hyperlipidemia -continue statin, check lipid panel in the morning   Essential hypertension - hold home losartan for now   Coronary artery disease -continue home Plavix, add aspirin   Type 2 diabetes mellitus with diabetic peripheral angiopathy without gangrene, with long-term current use  of insulin ([place]) -Diabetic diet when eating -Hemoglobin A1c -Continue home Lantus 40 units daily, dose confirmed with patient -Sliding scale insulin   S/P CABG (coronary artery bypass graft)   Chronic combined systolic and diastolic CHF (congestive heart failure) ([facility]) -no evidence of acute exacerbation, patient appears euvolemic on exam, will continue torsemide potassium supplementation   Fall at home, initial encounter   Hypokalemia  DVT prophylaxis: Lovenox  Code Status: Full code, confirmed with patient and his son at the bedside of the time of hospital admission.  Family Communication: Discussed with patient and his son in the ER this afternoon.  Disposition Plan: Anticipate patient will require home health versus subacute nursing facility at discharge  Consults called: Neurohospitalist  Admission status: Inpatient  Time spent: 56 minutes  Journey Castonguay Marry Guan MD [facility] Pager  [phone]  If 7PM-7AM, please contact night-coverage [email or web address] Password [facility]  12/09/2022, 4:13 PM

## 2022-12-10 ENCOUNTER — Inpatient Hospital Stay: Payer: HMO

## 2022-12-10 DIAGNOSIS — I639 Cerebral infarction, unspecified: Secondary | ICD-10-CM | POA: Diagnosis not present

## 2022-12-10 LAB — GLUCOSE, CAPILLARY
Glucose-Capillary: 203 mg/dL — ABNORMAL HIGH (ref 70–99)
Glucose-Capillary: 213 mg/dL — ABNORMAL HIGH (ref 70–99)
Glucose-Capillary: 260 mg/dL — ABNORMAL HIGH (ref 70–99)
Glucose-Capillary: 238 mg/dL — ABNORMAL HIGH (ref 70–99)

## 2022-12-10 LAB — BASIC METABOLIC PANEL
Anion gap: 9 (ref 5–15)
BUN: 15 mg/dL (ref 8–23)
CO2: 26 mmol/L (ref 22–32)
Calcium: 8.5 mg/dL — ABNORMAL LOW (ref 8.9–10.3)
Chloride: 104 mmol/L (ref 98–111)
Creatinine, Ser: 0.97 mg/dL (ref 0.61–1.24)
GFR, Estimated: >60 mL/min (ref >60)
Glucose, Bld: 192 mg/dL — ABNORMAL HIGH (ref 70–99)
Potassium: 3.1 mmol/L — ABNORMAL LOW (ref 3.5–5.1)
Sodium: 139 mmol/L (ref 135–145)

## 2022-12-10 LAB — CBC
HCT: 37.3 % — ABNORMAL LOW (ref 39.0–52.0)
Hemoglobin: 12.5 g/dL — ABNORMAL LOW (ref 13.0–17.0)
MCH: 29.9 pg (ref 26.0–34.0)
MCHC: 33.5 g/dL (ref 30.0–36.0)
MCV: 89.2 fL (ref 80.0–100.0)
Platelets: 184 10*3/uL (ref 150–400)
RBC: 4.18 MIL/uL — ABNORMAL LOW (ref 4.22–5.81)
RDW: 13.2 % (ref 11.5–15.5)
WBC: 6.2 10*3/uL (ref 4.0–10.5)
nRBC: 0 % (ref 0.0–0.2)

## 2022-12-10 LAB — URINALYSIS, ROUTINE W REFLEX MICROSCOPIC
Bilirubin Urine: NEGATIVE
Glucose, UA: NEGATIVE mg/dL
Hgb urine dipstick: NEGATIVE
Ketones, ur: NEGATIVE mg/dL
Leukocytes,Ua: NEGATIVE
Nitrite: NEGATIVE
Protein, ur: NEGATIVE mg/dL
Specific Gravity, Urine: 1.027 (ref 1.005–1.030)
pH: 5 (ref 5.0–8.0)

## 2022-12-10 LAB — LIPID PANEL
Cholesterol: 121 mg/dL (ref 0–200)
HDL: 26 mg/dL — ABNORMAL LOW (ref >40)
LDL Cholesterol: 52 mg/dL (ref 0–99)
Total CHOL/HDL Ratio: 4.7 RATIO
Triglycerides: 215 mg/dL — ABNORMAL HIGH (ref <150)
VLDL: 43 mg/dL — ABNORMAL HIGH (ref 0–40)

## 2022-12-10 LAB — HEMOGLOBIN A1C
Hgb A1c MFr Bld: 7.4 % — ABNORMAL HIGH (ref 4.8–5.6)
Mean Plasma Glucose: 165.68 mg/dL

## 2022-12-10 MED ORDER — FENOFIBRATE 54 MG PO TABS
54.0000 mg | ORAL_TABLET | Freq: Every day | ORAL | Status: DC
  Administered 2022-12-10 – 2022-12-14 (×5): 54 mg via ORAL
  Filled 2022-12-10 (×5): qty 1

## 2022-12-10 NOTE — Progress Notes (Signed)
MD notified of elevated BP. No new orders at this time. Pt is up in bed eating lunch with family at bedside.

## 2022-12-10 NOTE — Progress Notes (Signed)
PROGRESS NOTE  Joseph [name] ASN:[id number] DOB: 08-25-1925 DOA: 12/09/2022 PCP: [name], MD  Hospital Course/Subjective: Joseph [name] is a 89 y.o. male with medical history significant for hypertension, hyperlipidemia, insulin-dependent type 2 diabetes, coronary artery disease status post CABG about 40 years ago who is admitted to the hospitalist service for evaluation and management of acute left anterior choroidal artery stroke seen on MRI 1/19. He was started on DAPT (on Plavix at home), and had new right facial numbness and right hand weakness early this AM. He continues to have this sensation on my exam this AM. In the mean time he had repeat CT head early this AM which did not show any change. Otherwise he feels well, denies any slurred speech, extremity weakness, or difficulty swallowing.  Assessment/Plan:   Acute cerebrovascular accident (CVA) ([place]) -acute left anterior choroidal artery stroke seen on MRI 1/19 -Inpatient admission, with acute CVA order set utilized -Continuous telemetry for the next 48 hours -Hold antihypertensives for another 24 hours to allow for permissive hypertension -continue ASA and Plavix for now -Check 2D echo, CTA head and neck without LVO -Check fasting lipids, and hemoglobin A1c -Discussed with neurohospitalist Dr. Parke Simmers who consult today -Patient has passed nurse bedside swallow screen, okay for diabetic diet -PT/OT/SLP evaluations, anticipate he may benefit from skilled therapy at discharge likely [facility]   Hyperlipidemia -continue statin, LDL at goal but HDL low and TG 215  Essential hypertension - hold home losartan for now Coronary artery disease -continue home Plavix, add aspirin as above Type 2 diabetes mellitus with diabetic peripheral angiopathy without gangrene, with long-term current use of insulin ([place]) -Diabetic diet when eating -Hemoglobin A1c -Continue home Lantus 40 units daily, dose confirmed with  patient -Sliding scale insulin   S/P CABG (coronary artery bypass graft)   Chronic combined systolic and diastolic CHF (congestive heart failure) (HCC) -no evidence of acute exacerbation, patient appears euvolemic on exam, will continue torsemide and potassium supplementation   Fall at home, initial encounter   Hypokalemia   DVT prophylaxis: Lovenox   Code Status: Full code, confirmed with patient and his son at the bedside of the time of hospital admission.   Family Communication: Discussed with patient this AM, no family present.   Disposition Plan: Anticipate patient will require home health versus subacute nursing facility at discharge   Consults called: Neurohospitalist   Admission status: Inpatient  Antimicrobials: Anti-infectives (From admission, onward)    None       Objective: Vitals:   12/10/22 0100 12/10/22 0527 12/10/22 0633 12/10/22 0736  BP: 129/61 (!) 162/73 (!) 144/72 (!) 161/54  Pulse: 76 95 69 68  Resp: '20 18 18 16  '$ Temp: 98.8 F (37.1 C) 97.8 F (36.6 C) 98 F (36.7 C) 97.7 F (36.5 C)  TempSrc: Oral Oral Oral   SpO2: 95% 96% 95% 96%  Weight:      Height:       No intake or output data in the 24 hours ending 12/10/22 0850 Filed Weights   12/09/22 0956 12/09/22 2040  Weight: 99.8 kg 93.1 kg   Exam: General:  Alert, oriented, calm, in no acute distress, clear speech Eyes: EOMI, clear sclerea Neck: supple, no masses, trachea mildline  Cardiovascular: RRR, no murmurs or rubs, no peripheral edema  Respiratory: clear to auscultation bilaterally, no wheezes, no crackles  Abdomen: soft, nontender, nondistended, normal bowel tones heard  Skin: dry, no rashes  Musculoskeletal: no joint effusions, normal range of motion  Psychiatric:  appropriate affect, normal speech  Neurologic: extraocular muscles intact, clear speech, moving all extremities with intact sensorium and normal strength  Data Reviewed: CBC: Recent Labs  Lab 12/09/22 1040  12/10/22 0605  WBC 6.5 6.2  HGB 14.4 12.5*  HCT 42.0 37.3*  MCV 89.0 89.2  PLT 204 196   Basic Metabolic Panel: Recent Labs  Lab 12/09/22 1040 12/10/22 0605  NA 138 139  K 3.3* 3.1*  CL 104 104  CO2 24 26  GLUCOSE 242* 192*  BUN 16 15  CREATININE 1.03 0.97  CALCIUM 8.8* 8.5*   GFR: Estimated Creatinine Clearance: 49.5 mL/min (by C-G formula based on SCr of 0.97 mg/dL). Liver Function Tests: No results for input(s): "AST", "ALT", "ALKPHOS", "BILITOT", "PROT", "ALBUMIN" in the last 168 hours. No results for input(s): "LIPASE", "AMYLASE" in the last 168 hours. No results for input(s): "AMMONIA" in the last 168 hours. Coagulation Profile: No results for input(s): "INR", "PROTIME" in the last 168 hours. Cardiac Enzymes: No results for input(s): "CKTOTAL", "CKMB", "CKMBINDEX", "TROPONINI" in the last 168 hours. BNP (last 3 results) No results for input(s): "PROBNP" in the last 8760 hours. HbA1C: Recent Labs    12/09/22 2034  HGBA1C 7.4*   CBG: Recent Labs  Lab 12/09/22 1216 12/09/22 1710 12/09/22 2040 12/10/22 0734  GLUCAP 236* 185* 192* 203*   Lipid Profile: Recent Labs    12/10/22 0605  CHOL 121  HDL 26*  LDLCALC 52  TRIG 215*  CHOLHDL 4.7   Thyroid Function Tests: No results for input(s): "TSH", "T4TOTAL", "FREET4", "T3FREE", "THYROIDAB" in the last 72 hours. Anemia Panel: No results for input(s): "VITAMINB12", "FOLATE", "FERRITIN", "TIBC", "IRON", "RETICCTPCT" in the last 72 hours. Urine analysis:    Component Value Date/Time   COLORURINE YELLOW (A) 12/10/2022 0115   APPEARANCEUR CLEAR (A) 12/10/2022 0115   LABSPEC 1.027 12/10/2022 0115   PHURINE 5.0 12/10/2022 0115   GLUCOSEU NEGATIVE 12/10/2022 0115   HGBUR NEGATIVE 12/10/2022 0115   BILIRUBINUR NEGATIVE 12/10/2022 0115   BILIRUBINUR negative 02/10/2020 1600   KETONESUR NEGATIVE 12/10/2022 0115   PROTEINUR NEGATIVE 12/10/2022 0115   UROBILINOGEN 0.2 02/10/2020 1600   NITRITE NEGATIVE  12/10/2022 0115   LEUKOCYTESUR NEGATIVE 12/10/2022 0115   Sepsis Labs: '@LABRCNTIP'$ (procalcitonin:4,lacticidven:4)  )No results found for this or any previous visit (from the past 240 hour(s)).   Studies: CT HEAD WO CONTRAST (5MM)  Result Date: 12/10/2022 CLINICAL DATA:  Stroke follow-up.  New right-sided numbness EXAM: CT HEAD WITHOUT CONTRAST TECHNIQUE: Contiguous axial images were obtained from the base of the skull through the vertex without intravenous contrast. RADIATION DOSE REDUCTION: This exam was performed according to the departmental dose-optimization program which includes automated exposure control, adjustment of the mA and/or kV according to patient size and/or use of iterative reconstruction technique. COMPARISON:  Brain MRI from yesterday FINDINGS: Brain: Patient's left corona radiata infarct is subtle by CT. No visible progression or hemorrhage. Generalized cerebral volume loss. Vascular: No hyperdense vessel or unexpected calcification. Skull: Normal. Negative for fracture or focal lesion. Sinuses/Orbits: No acute finding. IMPRESSION: No hemorrhagic conversion or visible progression of the acute white matter infarct on the left. Electronically Signed   By: Jorje Guild M.D.   On: 12/10/2022 07:16   CT ANGIO HEAD NECK W WO CM  Result Date: 12/09/2022 CLINICAL DATA:  Stroke/TIA, determine embolic source. EXAM: CT ANGIOGRAPHY HEAD AND NECK TECHNIQUE: Multidetector CT imaging of the head and neck was performed using the standard protocol during bolus administration of intravenous contrast. Multiplanar  CT image reconstructions and MIPs were obtained to evaluate the vascular anatomy. Carotid stenosis measurements (when applicable) are obtained utilizing NASCET criteria, using the distal internal carotid diameter as the denominator. RADIATION DOSE REDUCTION: This exam was performed according to the departmental dose-optimization program which includes automated exposure control,  adjustment of the mA and/or kV according to patient size and/or use of iterative reconstruction technique. CONTRAST:  28m OMNIPAQUE IOHEXOL 350 MG/ML SOLN COMPARISON:  Brain MRI 12/09/2022. FINDINGS: CTA NECK FINDINGS Aortic arch: Three-vessel arch configuration. Atherosclerotic calcifications of the aortic arch and arch vessel origins. Right carotid system: Mild atherosclerotic calcifications of the common carotid and carotid bulb without stenosis. The right ICA is patent without stenosis or aneurysm. Left carotid system: Atherosclerotic calcifications of the common carotid and carotid bulb. Calcified plaque at the left ICA origin without hemodynamically significant stenosis. Vertebral arteries: Severe stenosis of the right vertebral artery origin. Mild stenosis of the left vertebral artery origin. The V2 and V3 segments are patent without evidence of dissection. Mild narrowing of the left mid V2 segment secondary to degenerative change at the left C3-4 neural foramen. Calcified plaque along the right V4 segment results in mild stenosis. The left V4 segment is patent without stenosis. Skeleton: Degenerative reversal of the normal cervical lordosis. No high-grade spinal canal stenosis. No suspicious bone lesions. Other neck: 1.2 cm right thyroid nodule (No follow-up imaging is recommended). Upper chest: Unremarkable. Review of the MIP images confirms the above findings CTA HEAD FINDINGS Anterior circulation: Extensive atherosclerotic calcifications of the carotid siphons with mild stenosis of the right ophthalmic segment and moderate stenosis of the left communicating segment. Mild stenosis of the distal right M1 segment. Patent left MCA. Distal branches are symmetric. Posterior circulation: Normal basilar artery. The SCAs, AICAs and PICAs are patent proximally. Moderate stenosis of the right P1 segment. Mild stenosis of the left proximal P2 segment. Distal branches are symmetric. Venous sinuses: Early phase of  contrast. Anatomic variants: Hypoplastic left A1 segment. Posterior communicating arteries are not visualized. Review of the MIP images confirms the above findings IMPRESSION: 1. No acute large vessel occlusion. 2. Severe stenosis of the right vertebral artery origin. 3. Multifocal intracranial atherosclerosis, notable for moderate stenosis of the left ICA communicating segment and right P1 segment. 4.  Aortic Atherosclerosis (ICD10-I70.0). Electronically Signed   By: WEmmit AlexandersM.D.   On: 12/09/2022 16:51   MR THORACIC SPINE WO CONTRAST  Result Date: 12/09/2022 CLINICAL DATA:  Leg weakness.  Right leg pain after fall. EXAM: MRI THORACIC SPINE WITHOUT CONTRAST TECHNIQUE: Multiplanar, multisequence MR imaging of the thoracic spine was performed. No intravenous contrast was administered. COMPARISON:  CT chest, abdomen, and pelvis dated Mar 21, 2016. FINDINGS: Alignment:  Physiologic. Vertebrae: No fracture, evidence of discitis, or bone lesion. Cord:  Normal signal and morphology. Paraspinal and other soft tissues: Negative. Disc levels: No significant disc bulge or herniation.  No stenosis. IMPRESSION: 1. No acute abnormality of the thoracic spine. Electronically Signed   By: WTitus DubinM.D.   On: 12/09/2022 14:44   MR BRAIN WO CONTRAST  Result Date: 12/09/2022 CLINICAL DATA:  Neuro deficit, acute, stroke suspected. EXAM: MRI HEAD WITHOUT CONTRAST TECHNIQUE: Multiplanar, multiecho pulse sequences of the brain and surrounding structures were obtained without intravenous contrast. COMPARISON:  Head CT 12/09/2022.  MRA head 07/27/2007. FINDINGS: Brain: Acute infarct in the left posterior limb of the internal capsule extending into the left corona radiata. Mild associated T2 hyperintensity. No acute hemorrhage. No mass effect or  midline shift. No hydrocephalus. No extra-axial collection. Basilar cisterns are patent. Vascular: Normal flow voids. Skull and upper cervical spine: Normal marrow signal. Mild  degenerative changes of the upper cervical spine. Sinuses/Orbits: Paranasal sinuses, mastoid air cells, and middle ear cavities are well aerated. Orbits are unremarkable. Other: None. IMPRESSION: Acute left anterior choroidal artery infarct with mild T2 hyperintensity. No acute hemorrhage or significant mass effect. Electronically Signed   By: Emmit Alexanders M.D.   On: 12/09/2022 14:42   MR LUMBAR SPINE WO CONTRAST  Result Date: 12/09/2022 CLINICAL DATA:  Leg weakness. Right leg pain after falling out of bed. EXAM: MRI LUMBAR SPINE WITHOUT CONTRAST TECHNIQUE: Multiplanar, multisequence MR imaging of the lumbar spine was performed. No intravenous contrast was administered. COMPARISON:  CT chest, abdomen, and pelvis dated Mar 21, 2016. FINDINGS: Segmentation:  Standard. Alignment:  Physiologic. Vertebrae: No fracture, evidence of discitis, or bone lesion. Degenerative left-sided vertebral body marrow edema at L2. Degenerative right-sided endplate marrow edema at L5-S1. Conus medullaris and cauda equina: Conus extends to the L1-L2 level. Conus and cauda equina appear normal. Fatty filum terminale. Paraspinal and other soft tissues: Negative. Disc levels: T12-L1:  No significant disc bulge or herniation.  No stenosis. L1-L2:  No significant disc bulge or herniation.  No stenosis. L2-L3:  Mild disc bulging.  No stenosis. L3-L4: Mild disc bulging. Mild to moderate spinal canal stenosis. No neuroforaminal stenosis. L4-L5: Mild disc bulging and bilateral facet arthropathy. Mild bilateral lateral recess stenosis. No spinal canal or neuroforaminal stenosis. L5-S1: Small broad-based posterior disc protrusion. Moderate bilateral facet arthropathy with ligamentum flavum hypertrophy. Mild left lateral recess stenosis. Mild-to-moderate bilateral neuroforaminal stenosis. IMPRESSION: 1. No acute abnormality. 2. Multilevel lumbar spondylosis as described above. Mild-to-moderate spinal canal stenosis at L3-L4. 3. Mild-to-moderate  bilateral neuroforaminal stenosis at L5-S1. Electronically Signed   By: Titus Dubin M.D.   On: 12/09/2022 14:40   CT Cervical Spine Wo Contrast  Result Date: 12/09/2022 CLINICAL DATA:  Trauma EXAM: CT CERVICAL SPINE WITHOUT CONTRAST TECHNIQUE: Multidetector CT imaging of the cervical spine was performed without intravenous contrast. Multiplanar CT image reconstructions were also generated. RADIATION DOSE REDUCTION: This exam was performed according to the departmental dose-optimization program which includes automated exposure control, adjustment of the mA and/or kV according to patient size and/or use of iterative reconstruction technique. COMPARISON:  None Available. FINDINGS: Reversed cervical lordosis identified without evidence of posterior element distraction or anterior translation consistent with muscle spasm or positioning. Degenerative disc disease noted at each cervical level with disc space narrowing and marginal osteophytes. Findings most severely involving C4-5 through C6-7. Osteoarthritis identified at C1-C2. Prevertebral and cervicocranial soft tissues are unremarkable. No compression deformities. No spondylolisthesis. No focal osteolytic or osteoblastic changes. IMPRESSION: Degenerative changes.  No acute traumatic abnormalities. Electronically Signed   By: Sammie Bench M.D.   On: 12/09/2022 11:32   CT HEAD WO CONTRAST  Result Date: 12/09/2022 CLINICAL DATA:  Head trauma, moderate-severe fall EXAM: CT HEAD WITHOUT CONTRAST TECHNIQUE: Contiguous axial images were obtained from the base of the skull through the vertex without intravenous contrast. RADIATION DOSE REDUCTION: This exam was performed according to the departmental dose-optimization program which includes automated exposure control, adjustment of the mA and/or kV according to patient size and/or use of iterative reconstruction technique. COMPARISON:  02/03/2017 FINDINGS: Brain: There is periventricular white matter decreased  attenuation consistent with small vessel ischemic changes. Ventricles, sulci and cisterns are prominent consistent with age related involutional changes. No acute intracranial hemorrhage, mass effect or  shift. No hydrocephalus. Vascular: No hyperdense vessel or unexpected calcification. Skull: Normal. Negative for fracture or focal lesion. Sinuses/Orbits: No acute finding. IMPRESSION: Atrophy and chronic small vessel ischemic changes. No acute intracranial process identified. Electronically Signed   By: Sammie Bench M.D.   On: 12/09/2022 11:30    Scheduled Meds:   stroke: early stages of recovery book   Does not apply Once   aspirin EC  81 mg Oral Daily   atorvastatin  80 mg Oral Daily   clopidogrel  75 mg Oral Daily   enoxaparin (LOVENOX) injection  0.5 mg/kg Subcutaneous Q24H   insulin aspart  0-15 Units Subcutaneous TID WC   insulin aspart  0-5 Units Subcutaneous QHS   insulin glargine-yfgn  40 Units Subcutaneous Daily   levothyroxine  50 mcg Oral QAC breakfast   pantoprazole  40 mg Oral Daily   potassium chloride  10 mEq Oral Daily   torsemide  20 mg Oral Daily    Continuous Infusions:   LOS: 1 day   Time spent: 31 minutes  Keghan Mcfarren Marry Guan, MD [facility] Pager [phone]  If 7PM-7AM, please contact night-coverage [email or web address] Password [email or web address] 12/10/2022, 8:50 AM

## 2022-12-10 NOTE — Progress Notes (Signed)
CROSS COVER NOTE  NAME: Joseph [name] MRN: [id number] DOB : 03-19-1925    HPI/Events of Note   89 yo m admitted to hospital with acute left anterior choroidal artery stroke   Assessment and  Interventions   Assessment: Nurse reports new onset right sided numbness not present earlier assessment Plan: Stat head CT r/o stroke extension Frequent neuro checks Notify Dr Curly Shores when she is available, acc to nursing, not available till 0800     Kathlene Cote NP [facility]

## 2022-12-10 NOTE — Progress Notes (Addendum)
Patient admitted for stroke with right side weakness with an NIH of 2. This morning at 0605 patient complained of right sided facial numbness and right arm numbness with the NIH of 4. Hospitalist notified, Head CT ordered. Patient alert and oriented x4, no facial drooping, no slurred speech, will continue to monitor.

## 2022-12-10 NOTE — Evaluation (Signed)
Speech Language Pathology Evaluation Patient Details Name: Joseph [name] MRN: [id number] DOB: 11/20/1925 Today's Date: 12/10/2022 Time: 8828-0034 SLP Time Calculation (min) (ACUTE ONLY): 8 min  Problem List:  Patient Active Problem List   Diagnosis Date Noted   Acute cerebrovascular accident (CVA) ([place]) 12/09/2022   Chronic combined systolic and diastolic CHF (congestive heart failure) ([place]) 12/09/2022   Fall at home, initial encounter 12/09/2022   Hypokalemia 12/09/2022   Skin tear of left hand without complication 91/79/1505   Pain of left hip 11/16/2022   Left hip pain 11/10/2022   Intertrigo 11/10/2022   COPD with acute exacerbation ([place]) 10/06/2022   Enlarged prostate 03/02/2022   Other specified counseling 03/02/2022   Renal calculi 03/02/2022   Type 2 diabetes mellitus without complication ([place]) 69/79/4801   Gastroesophageal reflux disease 03/02/2022   Hypertensive disorder 03/02/2022   Mixed hyperlipidemia 03/02/2022   Hypothyroidism 03/02/2022   Acute non-recurrent frontal sinusitis 01/05/2022   Glands swollen 01/05/2022   Hypertension associated with diabetes ([place]) 01/05/2022   Acute rhinitis 01/05/2022   AV block, [place] 1    NSTEMI (non-ST elevated myocardial infarction) ([place])    ACS (acute coronary syndrome) ([place]) 12/16/2019   Diverticulitis of large intestine without perforation or abscess without bleeding    Pain of upper abdomen    SOB (shortness of breath)    S/P CABG (coronary artery bypass graft)    Morbid obesity due to excess calories (HCC)    Back pain, chronic 07/28/2015   Benign fibroma of prostate 07/28/2015   Acid reflux 07/28/2015   Adult hypothyroidism 07/28/2015   Eunuchoidism 07/28/2015   Neuropathy 07/28/2015   Arthritis, degenerative 07/28/2015   Psoriasis 07/28/2015   Lumbar spondylosis 05/08/2015   Obesity 09/09/2014   Bilateral leg weakness 09/09/2014   Acute on chronic combined systolic and diastolic HF (heart failure) ([place])  05/13/2014   Bilateral leg edema 04/15/2014   Bradycardia 08/13/2013   Edema 06/21/2012   Fatigue 09/15/2011   Type 2 diabetes mellitus with diabetic peripheral angiopathy without gangrene, with long-term current use of insulin ([place]) 09/15/2011   DYSPNEA 06/09/2009   Hyperlipidemia 06/07/2009   Essential hypertension 06/07/2009   Coronary artery disease 06/07/2009   Past Medical History:  Past Medical History:  Diagnosis Date   Acute respiratory failure with hypoxia ([place]) 03/21/2016   CAD (coronary artery disease)    a. s/p CABG;  b. 07/2012 Cath: 3VD including 80 dLCX (med Rx), 4/4 patent grafts.   Chronic diastolic CHF (congestive heart failure) ([place])    a. 07/2012 Echo: EF 55-60%, no rwma, Gr 1 DD, mild MR;  04/2014 Echo: EF 55-60%, no rwma, mild MR, mildly dil LA.   Diabetes mellitus, type 2 ([place])    HTN (hypertension)    Hyperlipidemia    Hypothyroidism    Lower extremity edema    a. 03/2014 amlodipine d/c'd.   Spinal stenosis    Past Surgical History:  Past Surgical History:  Procedure Laterality Date   APPENDECTOMY     CARDIAC CATHETERIZATION  07/2012   [facility]/no stents   CHOLECYSTECTOMY     CORNEAL TRANSPLANT     CORONARY ARTERY BYPASS GRAFT  1994   HEMORRHOID SURGERY     LEFT HEART CATH AND CORS/GRAFTS ANGIOGRAPHY N/A 12/17/2019   Procedure: LEFT HEART CATH AND CORS/GRAFTS ANGIOGRAPHY;  Surgeon: Nelva Bush, MD;  Location: [facility];  Service: Cardiovascular;  Laterality: N/A;   PROSTATE BIOPSY     TOE SURGERY     ingrown  toe nail   HPI:  Pt is a 89 y/o male admitted secondary to R sided weakness. MRI of the brain revealed an acute L anterior choroidal artery infarct. PMH including but not limited to ypertension, hyperlipidemia, insulin-dependent type 2 diabetes, coronary artery disease status post CABG about 40 years ago.   Assessment / Plan / Recommendation Clinical Impression  Pt presents with funcitonal cognitive linguistic abilities that appear  spared by recent stroke. Pt did demonstrate some impulsivity when exiting bed d/t urgency of losse stools. Overall skilled ST intervention doesn't appear indicated at this time.    SLP Assessment  SLP Recommendation/Assessment: Patient does not need any further Speech Roff Pathology Services SLP Visit Diagnosis: Cognitive communication deficit (R41.841)    Recommendations for follow up therapy are one component of a multi-disciplinary discharge planning process, led by the attending physician.  Recommendations may be updated based on patient status, additional functional criteria and insurance authorization.    Follow Up Recommendations  No SLP follow up    Assistance Recommended at Discharge     Functional Status Assessment    Frequency and Duration           SLP Evaluation Cognition  Overall Cognitive Status: Within Functional Limits for tasks assessed Arousal/Alertness: Awake/alert Orientation Level: Oriented X4       Comprehension  Auditory Comprehension Overall Auditory Comprehension: Appears within functional limits for tasks assessed Visual Recognition/Discrimination Discrimination: Not tested Reading Comprehension Reading Status: Not tested    Expression Expression Primary Mode of Expression: Verbal Verbal Expression Overall Verbal Expression: Appears within functional limits for tasks assessed Written Expression Dominant Hand: Right Written Expression: Not tested   Oral / Motor  Oral Motor/Sensory Function Overall Oral Motor/Sensory Function: Within functional limits Motor Speech Overall Motor Speech: Appears within functional limits for tasks assessed            Camyla Camposano 12/10/2022, 11:57 AM

## 2022-12-10 NOTE — Progress Notes (Signed)
Mobility Specialist - Progress Note   12/10/22 0836  Mobility  Activity Ambulated with assistance in room;Transferred to/from [facility]  Level of Assistance +2 (takes two people)  Assistive Device None  Distance Ambulated (ft) 2 ft  Activity Response Tolerated well  Mobility Referral Yes  $Mobility charge 1 Mobility   Pt semi-supine in bed on RA upon arrival, requesting assistance to [facility]. Pt completes bed mobility with HHA. Pt completes SPT +2 with ModA. Pt left on [facility] with RN in room.   Gretchen Short  Mobility Specialist  12/10/22 8:38 AM

## 2022-12-10 NOTE — Consult Note (Addendum)
Neurology Consultation Reason for Consult: Stroke on MRI Requesting Physician: Dr. Shirline Frees  CC: Right-sided weakness  History is obtained from: Patient, daughter at bedside, chart review  HPI: Joseph [name] is a 89 y.o. male with a past medical history significant for diabetes, hypertension, hyperlipidemia, obesity (BMI 32.15), coronary artery disease s/p CABG, first-degree heart block, heart failure with reduced EF, COPD, hypothyroidism, psoriasis, former smoking, spinal stenosis, prior CVA without residual deficit ("mini stroke" affecting his speech with hospital stay complicated by hallucinations multiple years ago)  He lives independently although he does have a caretaker who comes from 8:30 AM to 12:30 PM every day to assist with cleaning/laundry and meal preparation (breakfast, lunch, dinner).  Otherwise he is very independent including continuing to drive around town getting his own groceries etc.  He went to bed normally on the evening of 1/18 and awoke on 1/19 with right-sided weakness causing him to fall when he tried to get out of bed, just after 8 AM.  Daughter is concerned that over the weekend he had 2 days of nonadherence to Plavix as he was trying to determine whether it was contributing to some pain he was having.  He did restart it at her urging and does not feel that it made a difference with the pain to have stopped  LKW: 1/18 evening  Thrombolytic given?: No, out of the window IA performed?: No, no LVO Premorbid modified rankin scale:      2 - Slight disability. Able to look after own affairs without assistance, but unable to carry out all previous activities.  ROS: All other review of systems was negative except as noted in the HPI.  Past Medical History:  Diagnosis Date   Acute respiratory failure with hypoxia (HCC) 03/21/2016   CAD (coronary artery disease)    a. s/p CABG;  b. 07/2012 Cath: 3VD including 80 dLCX (med Rx), 4/4 patent grafts.   Chronic diastolic CHF  (congestive heart failure) (HCC)    a. 07/2012 Echo: EF 55-60%, no rwma, Gr 1 DD, mild MR;  04/2014 Echo: EF 55-60%, no rwma, mild MR, mildly dil LA.   Diabetes mellitus, type 2 (HCC)    HTN (hypertension)    Hyperlipidemia    Hypothyroidism    Lower extremity edema    a. 03/2014 amlodipine d/c'd.   Spinal stenosis    Past Surgical History:  Procedure Laterality Date   APPENDECTOMY     CARDIAC CATHETERIZATION  07/2012   [facility]/no stents   CHOLECYSTECTOMY     CORNEAL TRANSPLANT     CORONARY ARTERY BYPASS GRAFT  1994   HEMORRHOID SURGERY     LEFT HEART CATH AND CORS/GRAFTS ANGIOGRAPHY N/A 12/17/2019   Procedure: LEFT HEART CATH AND CORS/GRAFTS ANGIOGRAPHY;  Surgeon: Yvonne Kendall, MD;  Location: [facility] INVASIVE CV LAB;  Service: Cardiovascular;  Laterality: N/A;   PROSTATE BIOPSY     TOE SURGERY     ingrown toe nail   Current Outpatient Medications  Medication Instructions   atorvastatin (LIPITOR) 80 mg, Oral, Daily   Cholecalciferol 5,000 Units, Oral, Daily at bedtime   clobetasol cream (TEMOVATE) 0.05 % 1 application , Topical, 2 times daily, As needed to hands and elbows   clopidogrel (PLAVIX) 75 mg, Oral, Daily   Continuous Blood Gluc Receiver (FREESTYLE LIBRE 2 READER) DEVI Use 1 Device as directed   Continuous Blood Gluc Sensor (FREESTYLE LIBRE 14 DAY SENSOR) MISC USE AS DIRECTED EVERY 14 DAYS   hydrocortisone 2.5 % cream APPLY TOPICALLY TWICE  DAILY AS NEEDED TO FACE, CREASE, GROIN, BUTT.   insulin aspart (NOVOLOG) 100 UNIT/ML injection Take from 14 - 34 units before meals, as directed. Max daily dose is 90 units.   insulin glargine (LANTUS) 40 Units, Subcutaneous, Daily   ketoconazole (NIZORAL) 2 % cream APPLY TOPICALLY TWICE DAILY AS NEEDED FOR IRRITATION TO GROIN, FACE, AND BUTTOCKS   levothyroxine (SYNTHROID) 50 MCG tablet TAKE 1 TABLET BY MOUTH ONCE DAILY BEFORE BREAKFAST   losartan (COZAAR) 100 mg, Oral, Daily   Magnesium 250 mg, Oral, As needed, Take when you take your  Torsemide.   nitroGLYCERIN (NITROSTAT) 0.4 mg, Sublingual, Every 5 min PRN   nystatin (MYCOSTATIN/NYSTOP) powder 1 Application, Topical, 3 times daily   omeprazole (PRILOSEC) 20 MG capsule Take 1 capsule by mouth once daily   potassium chloride (KLOR-CON M10) 10 MEQ tablet 10 mEq, Oral, As needed   spironolactone (ALDACTONE) 25 mg, Oral, Daily   torsemide (DEMADEX) 20 mg, Oral, Daily     Family History  Problem Relation Age of Onset   Heart failure Mother    Heart attack Father    Stroke Brother    Heart failure Maternal Uncle    Heart failure Maternal Grandfather    Sleep apnea Daughter    Sleep apnea Son    Kidney Stones Son     Social History:  reports that he quit smoking about 41 years ago. His smoking use included pipe, cigars, and cigarettes. He has never used smokeless tobacco. He reports that he does not drink alcohol and does not use drugs.   Exam: Current vital signs: BP (!) 161/54 (BP Location: Right Arm)   Pulse 68   Temp 97.7 F (36.5 C)   Resp 16   Ht 5\' 7"  (1.702 m)   Wt 93.1 kg   SpO2 96%   BMI 32.15 kg/m  Vital signs in last 24 hours: Temp:  [97.7 F (36.5 C)-98.8 F (37.1 C)] 97.7 F (36.5 C) (01/20 0736) Pulse Rate:  [61-95] 68 (01/20 0736) Resp:  [16-20] 16 (01/20 0736) BP: (129-184)/(54-111) 161/54 (01/20 0736) SpO2:  [95 %-98 %] 96 % (01/20 0736) Weight:  [93.1 kg-99.8 kg] 93.1 kg (01/19 2040)   Physical Exam  Constitutional: Appears well-developed and well-nourished.  Psych: Affect appropriate to situation, pleasant and cooperative Eyes: No scleral injection HENT: No oropharyngeal obstruction, limited lateral rotation bilaterally of the neck MSK: no joint deformities, age expected arthritic changes.  Cardiovascular: Normal rate and regular rhythm. Perfusing extremities well Respiratory: Effort normal, non-labored breathing GI: Soft.  No distension. There is no tenderness.  Skin: Warm dry and intact visible skin  Neuro: Mental  Status: Patient is awake, alert, oriented to person, place, month, year, and situation. Patient is able to give a clear and coherent history. No signs of aphasia or neglect Cranial Nerves: II: Visual Fields are full. Pupils are equal, round, and reactive to light.   III,IV, VI: EOMI without ptosis or diploplia (mild bilateral ptosis).  V: Facial sensation is symmetric to light touch VII: Facial movement is symmetric (at times very slight delay in muscle activation of the right).  VIII: hearing is intact to voice X: Uvula elevates symmetrically XI: Shoulder shrug is symmetric. XII: tongue is midline without atrophy or fasciculations.  Motor: Tone is normal. Bulk is normal. 5/5 strength was present on the left side.  On the right side 4-5/5 throughout in an upper motor neuron pattern Sensory: Slightly reduced in the right arm and leg Deep Tendon Reflexes:  2+ and symmetric in the brachioradialis and patellae.  Cerebellar: Dysmetria out of proportion to weakness on the right upper extremity, otherwise intact on the left upper extremity and intact heel-to-shin bilateral lower extremities Gait:  Deferred in acute setting   NIHSS total 4 Score breakdown: 1 point for right upper extremity weakness, 1 point for right lower leg weakness, 1 point for right upper extremity limb ataxia, 1 point for sensory change on the right side Performed at 12:40 PM   I have reviewed labs in epic and the results pertinent to this consultation are:  Basic Metabolic Panel: Recent Labs  Lab 12/09/22 1040 12/10/22 0605  NA 138 139  K 3.3* 3.1*  CL 104 104  CO2 24 26  GLUCOSE 242* 192*  BUN 16 15  CREATININE 1.03 0.97  CALCIUM 8.8* 8.5*    CBC: Recent Labs  Lab 12/09/22 1040 12/10/22 0605  WBC 6.5 6.2  HGB 14.4 12.5*  HCT 42.0 37.3*  MCV 89.0 89.2  PLT 204 184    Coagulation Studies: No results for input(s): "LABPROT", "INR" in the last 72 hours.   Lab Results  Component Value Date    HGBA1C 7.4 (H) 12/09/2022   Lab Results  Component Value Date   CHOL 121 12/10/2022   HDL 26 (L) 12/10/2022   LDLCALC 52 12/10/2022   TRIG 215 (H) 12/10/2022   CHOLHDL 4.7 12/10/2022     I have reviewed the images obtained:  MRI Brain personally reviewed, agree with radiology:   Acute left anterior choroidal artery infarct with mild T2 hyperintensity. No acute hemorrhage or significant mass effect. [Left thalamocapsular stroke]  MRI lumbar spine 1. No acute abnormality. 2. Multilevel lumbar spondylosis as described above. Mild-to-moderate spinal canal stenosis at L3-L4. 3. Mild-to-moderate bilateral neuroforaminal stenosis at L5-S1.  MRI thoracic spine: 1. No acute abnormality of the thoracic spine.   CT head 1/19 11:22 personally reviewed, agree with radiology:   Atrophy and chronic small vessel ischemic changes. No acute intracranial process identified.  CT C-spine: Reversed cervical lordosis identified without evidence of posterior element distraction or anterior translation consistent with muscle spasm or positioning. Degenerative disc disease noted at each cervical level with disc space narrowing and marginal osteophytes. Findings most severely involving C4-5 through C6-7. Osteoarthritis identified at C1-C2. Prevertebral and cervicocranial soft tissues are unremarkable. No compression deformities. No spondylolisthesis. No focal osteolytic or osteoblastic changes.  CTA head/neck personally reviewed, agree with radiology:   1. No acute large vessel occlusion. 2. Severe stenosis of the right vertebral artery origin. 3. Multifocal intracranial atherosclerosis, notable for moderate stenosis of the left ICA communicating segment and right P1 segment. 4.  Aortic Atherosclerosis (ICD10-I70.0).  CT head 12/10/2022 7:09 AM No hemorrhagic conversion or visible progression of the acute white matter infarct on the left.  ECHO 04/28/22  1. Left ventricular ejection fraction,  by estimation, is 35 to 40%. The  left ventricle has moderately decreased function. The left ventricle  demonstrates global hypokinesis with severe hypokinesis of the bassal to  mid anterior and anteroseptal wall. The   left ventricular internal cavity size was mildly dilated. There is mild  left ventricular hypertrophy. Left ventricular diastolic parameters are  consistent with Grade I diastolic dysfunction (impaired relaxation).   2. Right ventricular systolic function is normal. The right ventricular  size is normal.   3. The mitral valve is normal in structure. Mild mitral valve  regurgitation. No evidence of mitral stenosis.   4. The aortic valve is normal  in structure. Aortic valve regurgitation is  not visualized. Aortic valve sclerosis/calcification is present, without  any evidence of aortic stenosis.   5. There is mild dilatation of the ascending aorta, measuring 42 mm.   6. The inferior vena cava is normal in size with greater than 50%  respiratory variability, suggesting right atrial pressure of 3 mmHg.  [Normal biatrial sizes]    Impression: Lacunar stroke most likely secondary to small vessel disease.  However cannot rule out embolic etiology and given his cardiac history, I do think a 30-day event monitor is indicated if echocardiogram/telemetry does not reveal an indication for anticoagulation  Recommendations:  # Left thalamocapsular stroke - LDL meeting goal, A1c above target < 7%, medication adjustements per PCP/primary team - He does have significant hypertriglyceridemia, appreciate management of this by primary team/PCP as it is a stroke risk factor - Frequent neuro checks - Echocardiogram - Continue home Plavix 75 mg daily - Aspirin 81 mg daily for a 21 day course  - Risk factor modification, diet exercise and medication adherence counseling completed - Telemetry monitoring; 30 day event monitor on discharge if no arrythmias captured  - Blood pressure goal    -Given some fluctuation in his symptoms this morning, continue to allow permissive hypertension today and start to normalize blood pressure gradually over 3 to 5 days starting tomorrow - PT consult, OT consult, Speech consult - Neurology will follow-up echocardiogram but otherwise will be available as needed going forward, please reach out if any additional questions or concerns arise   Brooke Dare MD-PhD [facility] [phone] [facility] coverage for [facility] is from 8 AM to 4 AM in-house and 4 PM to 8 PM by telephone/video. 8 PM to 8 AM emergent questions or overnight urgent questions should be addressed to Teleneurology On-call or [facility] neurohospitalist; contact information can be found on AMION

## 2022-12-10 NOTE — Evaluation (Signed)
Occupational Therapy Evaluation Patient Details Name: Joseph [name] MRN: [id number] DOB: Dec 19, 1924 Today's Date: 12/10/2022   History of Present Illness Pt is a 89 y/o male admitted secondary to R sided weakness. MRI of the brain revealed an acute L anterior choroidal artery infarct. PMH including but not limited to ypertension, hyperlipidemia, insulin-dependent type 2 diabetes, coronary artery disease status post CABG about 40 years ago.   Clinical Impression   Patient presenting with decreased independence in self care, balance, functional mobility/transfers, endurance, and safety awareness. PTA pt lived alone, was independent for ADLs (recent difficulty with shower transfer), received assistance for IADLs, and independent for functional mobility without an AD. Pt has PCA 6 days/week from 8:30 am-12:30 pm (Mon-Sat). Pt currently functioning at [place] A for bed mobility, Mod A +2 for squat pivot transfer from EOB<>BSC via HHA, Max A for posterior hygiene, and Max A for LB dressing. Pt demonstrated decreased FM coordination and strength for RUE. VC for safety awareness during OOB mobility 2/2 urgency of needing to use BSC (experiencing loose stools). RN aware. Pt will benefit from acute OT to increase overall independence in the areas of ADLs and functional mobility in order to safely discharge to next venue of care. Upon hospital discharge, recommend STR to maximize pt safety and return to PLOF.    Recommendations for follow up therapy are one component of a multi-disciplinary discharge planning process, led by the attending physician.  Recommendations may be updated based on patient status, additional functional criteria and insurance authorization.   Follow Up Recommendations  Skilled nursing-short term rehab (<3 hours/day)     Assistance Recommended at Discharge Frequent or constant Supervision/Assistance  Patient can return home with the following Two people to help with walking and/or  transfers;Two people to help with bathing/dressing/bathroom;Assistance with cooking/housework;Assist for transportation;Help with stairs or ramp for entrance;Assistance with feeding    Functional Status Assessment  Patient has had a recent decline in their functional status and demonstrates the ability to make significant improvements in function in a reasonable and predictable amount of time.  Equipment Recommendations  BSC/3in1;Tub/shower bench (has shower chair in the tub currently, but recently difficulty stepping over the side of tub)    Recommendations for Other Services       Precautions / Restrictions Precautions Precautions: Fall Restrictions Weight Bearing Restrictions: No      Mobility Bed Mobility Overal bed mobility: Needs Assistance Bed Mobility: Supine to Sit, Sit to Supine     Supine to sit: Min assist (for trunk elevation) Sit to supine: Min assist (to manage BLEs)   General bed mobility comments: Able to scoot self up toward [facility] using bed rails with VC for technique    Transfers Overall transfer level: Needs assistance Equipment used: 2 person hand held assist Transfers: Bed to chair/wheelchair/BSC     Squat pivot transfers: Mod assist, +2 physical assistance              Balance Overall balance assessment: Needs assistance Sitting-balance support: Feet supported Sitting balance-Leahy Scale: Fair     Standing balance support: During functional activity, Bilateral upper extremity supported Standing balance-Leahy Scale: Poor                             ADL either performed or assessed with clinical judgement   ADL Overall ADL's : Needs assistance/impaired   Eating/Feeding Details (indicate cue type and reason): daughter reports pt having difficulty holding utensils and assisted  with self-feeding                 Lower Body Dressing: Maximal assistance;Sitting/lateral leans Lower Body Dressing Details (indicate cue type and  reason): for socks Toilet Transfer: Squat-pivot;BSC/3in1;+2 for physical assistance;Moderate assistance Toilet Transfer Details (indicate cue type and reason): via [place] and Hygiene: Maximal assistance;Sit to/from stand;+2 for safety/equipment Toileting - Clothing Manipulation Details (indicate cue type and reason): for posterior hygiene after BM, only able to achieve partial stand long enough for therapist to clean bottom             Vision Baseline Vision/History: 1 Wears glasses Patient Visual Report: No change from baseline Vision Assessment?: No apparent visual deficits Additional Comments: pt denied any blurry vision or diplopia, good ability to visually track stimulus     Perception     Praxis      Pertinent Vitals/Pain Pain Assessment Pain Assessment: No/denies pain     Hand Dominance Right   Extremity/Trunk Assessment Upper Extremity Assessment Upper Extremity Assessment: Generalized weakness;RUE deficits/detail (BUE shoulder flexion grossly 3+/5, elbow flexion/extension grossly 4/5, B grip strength good) RUE Deficits / Details: + dysmetria with finger to nose testing, decreased speed/accuracy of FM coordination, pt denies sensation changes RUE Coordination: decreased fine motor   Lower Extremity Assessment Lower Extremity Assessment: Generalized weakness;RLE deficits/detail RLE Deficits / Details: pt able to manage his R LE off of and back onto bed independently; however, unable to move it during standing or transfers. No buckling of knee noted.       Communication Communication Communication: No difficulties   Cognition Arousal/Alertness: Awake/alert Behavior During Therapy: Impulsive, WFL for tasks assessed/performed Overall Cognitive Status: Within Functional Limits for tasks assessed Area of Impairment: Safety/judgement, Problem solving                         Safety/Judgement: Decreased awareness of safety,  Decreased awareness of deficits   Problem Solving: Difficulty sequencing, Requires verbal cues, Requires tactile cues General Comments: A&Ox4 (grossly to situation), "I don't understand why I can't use that R leg". Some impulsivity when exiting bed due to urgency of loose stools     General Comments       Exercises Other Exercises Other Exercises: OT provided education re: role of OT, OT POC, post acute recs, sitting up for all meals, EOB/OOB mobility with assistance, home/fall safety.     Shoulder Instructions      Home Living Family/patient expects to be discharged to:: Private residence Living Arrangements: Alone Available Help at Discharge: Personal care attendant Type of Home: House Home Access: Stairs to enter [employer] of Steps: 2 Entrance Stairs-Rails: Right;Left Home Layout: One level     Bathroom Shower/Tub: Tub/shower unit         Home Equipment: Advice worker (2 wheels);Rollator (4 wheels);Transport chair;Cane - single point;Hand held shower head;Grab bars - tub/shower   Additional Comments: PCA 6 days/week from 8:30 am-12:30 pm (Monday-Saturday).      Prior Functioning/Environment Prior Level of Function : Independent/Modified Independent;Driving             Mobility Comments: was previously ambulating without an AD ADLs Comments: Normally independent for ADLs, however, daughter reports recent difficulty with shower transfer into tub thus pt not bathing as often. PCA assist with all IADLs. Drives short distances.        OT Problem List: Decreased strength;Decreased activity tolerance;Impaired balance (sitting and/or standing);Decreased coordination;Decreased safety awareness;Impaired UE functional  use      OT Treatment/Interventions: Self-care/ADL training;Therapeutic exercise;Therapeutic activities;Energy conservation;DME and/or AE instruction;Patient/family education;Balance training    OT Goals(Current goals can be found  in the care plan section) Acute Rehab OT Goals Patient Stated Goal: get stronger, use RUE OT Goal Formulation: With patient/family Time For Goal Achievement: 12/24/22 Potential to Achieve Goals: Fair   OT Frequency: Min 2X/week    Co-evaluation            AM-PAC OT "6 Clicks" Daily Activity     Outcome Measure Help from another person eating meals?: A Lot Help from another person taking care of personal grooming?: A Lot Help from another person toileting, which includes using toliet, bedpan, or urinal?: A Lot Help from another person bathing (including washing, rinsing, drying)?: A Lot Help from another person to put on and taking off regular upper body clothing?: A Little Help from another person to put on and taking off regular lower body clothing?: A Lot 6 Click Score: 13   End of Session Equipment Utilized During Treatment: Gait belt Nurse Communication: Mobility status  Activity Tolerance: Patient tolerated treatment well;Patient limited by fatigue Patient left: in bed;with call bell/phone within reach;with bed alarm set;with family/visitor present  OT Visit Diagnosis: Other abnormalities of gait and mobility (R26.89);Hemiplegia and hemiparesis Hemiplegia - Right/Left: Right Hemiplegia - dominant/non-dominant: Dominant Hemiplegia - caused by: Cerebral infarction                Time: [id number] OT Time Calculation (min): 30 min Charges:  OT General Charges $OT Visit: 1 Visit OT Evaluation $OT Eval Moderate Complexity: 1 Mod  [facility] MS, OTR/L ascom [phone]  12/10/22, 1:39 PM

## 2022-12-10 NOTE — Progress Notes (Signed)
MD Attending notified of numbness and decreased sensation on R side of face and upper extremity. No new orders at this time. Pt back from CT scan.

## 2022-12-10 NOTE — Evaluation (Signed)
Physical Therapy Evaluation Patient Details Name: ABIGAIL MARSIGLIA MRN: [id number] DOB: 08/11/1925 Today's Date: 12/10/2022  History of Present Illness  Pt is a 89 y/o male admitted secondary to R sided weakness. MRI of the brain revealed an acute L anterior choroidal artery infarct. PMH including but not limited to ypertension, hyperlipidemia, insulin-dependent type 2 diabetes, coronary artery disease status post CABG about 40 years ago.   Clinical Impression  Pt presented supine in bed with HOB elevated, awake and willing to participate in therapy session. Prior to admission, pt reported that he was independent with all functional mobility and ADLs. He had assistance from caregivers 6 days/week (from 8:30-12:30) for IADLs. Pt reported that he was still driving. Pt lives alone in a single level house with two steps to enter. At the time of evaluation, pt demonstrating significant R sided weakness in both upper and lower extremity on the right. He required min A for bed mobility and min-mod A x2 for transfers. He was unable to fully stand upright during transfers or with pericare after having a BM at the [facility]. Pt's daughter concerned that she would be unable to care for pt if he were to d/c home. PT recommending pt d/c to a SNF for short-term rehab to maximize his safety and independence with functional mobility prior to returning home with family support. Pt would continue to benefit from skilled physical therapy services at this time while admitted and after d/c to address the below listed limitations in order to improve overall safety and independence with functional mobility.      Recommendations for follow up therapy are one component of a multi-disciplinary discharge planning process, led by the attending physician.  Recommendations may be updated based on patient status, additional functional criteria and insurance authorization.  Follow Up Recommendations Skilled nursing-short term rehab (<3  hours/day) (prefers [facility]) Can patient physically be transported by private vehicle: No    Assistance Recommended at Discharge Frequent or constant Supervision/Assistance  Patient can return home with the following  Two people to help with walking and/or transfers;Two people to help with bathing/dressing/bathroom;Assistance with cooking/housework;Assist for transportation;Help with stairs or ramp for entrance    Equipment Recommendations Other (comment) (defer to next venue of care)  Recommendations for Other Services       Functional Status Assessment Patient has had a recent decline in their functional status and demonstrates the ability to make significant improvements in function in a reasonable and predictable amount of time.     Precautions / Restrictions Precautions Precautions: Fall Restrictions Weight Bearing Restrictions: No      Mobility  Bed Mobility Overal bed mobility: Needs Assistance Bed Mobility: Supine to Sit, Sit to Supine     Supine to sit: Min assist Sit to supine: Min assist   General bed mobility comments: HOB slightly elevated, use of bed rails, min A for trunk elevation to achieve upright sitting position at EOB    Transfers Overall transfer level: Needs assistance Equipment used: None, 1 person hand held assist Transfers: Bed to chair/wheelchair/BSC       Squat pivot transfers: Mod assist, Min assist, +2 physical assistance, +2 safety/equipment     General transfer comment: pt initially very impulsive with first transfer from bed to [facility] due to [facility] urgency. He required mod A for stability and safety with transfer from bed to [facility] towards his R side. Second transfer from [facility] to bed with 2 person min A.    Ambulation/Gait  General Gait Details: unable at this time  Stairs            Wheelchair Mobility    Modified Rankin (Stroke Patients Only) Modified Rankin (Stroke Patients Only) Pre-Morbid Rankin Score: No  symptoms Modified Rankin: Severe disability     Balance Overall balance assessment: Needs assistance Sitting-balance support: Feet supported Sitting balance-Leahy Scale: Fair     Standing balance support: During functional activity, Bilateral upper extremity supported Standing balance-Leahy Scale: Poor Standing balance comment: pt required total A for pericare after BM and unable to stand without bilateral UE supports                             Pertinent Vitals/Pain Pain Assessment Pain Assessment: No/denies pain    Home Living Family/patient expects to be discharged to:: Private residence Living Arrangements: Alone Available Help at Discharge: Personal care attendant (8:30-12:30 6x/week, alone on Sundays) Type of Home: House Home Access: Stairs to enter Entrance Stairs-Rails: Psychiatric nurse of Steps: 2   Home Layout: One level Home Equipment: Advice worker (2 wheels);Rollator (4 wheels);Transport chair;Cane - single point      Prior Function Prior Level of Function : Independent/Modified Independent;Driving             Mobility Comments: was previously ambulating without an AD ADLs Comments: independent; however, daughter reporting that he did not bathe frequently. Assistance from caregivers for IADLs     Hand Dominance   Dominant Hand: Right    Extremity/Trunk Assessment   Upper Extremity Assessment Upper Extremity Assessment: Defer to OT evaluation    Lower Extremity Assessment Lower Extremity Assessment: RLE deficits/detail RLE Deficits / Details: pt able to manage his R LE off of and back onto bed independently; however, unable to move it during standing or transfers. No buckling of knee noted.       Communication   Communication: No difficulties  Cognition Arousal/Alertness: Awake/alert Behavior During Therapy: Flat affect, Impulsive Overall Cognitive Status: Impaired/Different from baseline Area of  Impairment: Safety/judgement, Problem solving                         Safety/Judgement: Decreased awareness of safety, Decreased awareness of deficits   Problem Solving: Difficulty sequencing, Requires verbal cues, Requires tactile cues General Comments: pt's daughter present and reporting that his cognition has not been effected        General Comments      Exercises     Assessment/Plan    PT Assessment Patient needs continued PT services  PT Problem List Decreased strength;Decreased balance;Decreased mobility;Decreased coordination;Decreased cognition;Decreased knowledge of use of DME;Decreased safety awareness;Decreased knowledge of precautions       PT Treatment Interventions DME instruction;Gait training;Stair training;Functional mobility training;Therapeutic activities;Therapeutic exercise;Balance training;Neuromuscular re-education;Patient/family education    PT Goals (Current goals can be found in the Care Plan section)  Acute Rehab PT Goals Patient Stated Goal: to return to PLOF PT Goal Formulation: With patient/family Time For Goal Achievement: 12/24/22 Potential to Achieve Goals: Fair    Frequency Min 2X/week     Co-evaluation               AM-PAC PT "6 Clicks" Mobility  Outcome Measure Help needed turning from your back to your side while in a flat bed without using bedrails?: A Lot Help needed moving from lying on your back to sitting on the side of a flat bed without using bedrails?:  A Little Help needed moving to and from a bed to a chair (including a wheelchair)?: A Lot Help needed standing up from a chair using your arms (e.g., wheelchair or bedside chair)?: A Lot Help needed to walk in hospital room?: Total Help needed climbing 3-5 steps with a railing? : Total 6 Click Score: 11    End of Session Equipment Utilized During Treatment: Gait belt Activity Tolerance: Patient tolerated treatment well Patient left: in bed;with call  bell/phone within reach;with bed alarm set;with family/visitor present Nurse Communication: Mobility status PT Visit Diagnosis: Other abnormalities of gait and mobility (R26.89);Hemiplegia and hemiparesis Hemiplegia - Right/Left: Right Hemiplegia - dominant/non-dominant: Dominant Hemiplegia - caused by: Cerebral infarction    Time: 3818-2993 PT Time Calculation (min) (ACUTE ONLY): 25 min   Charges:   PT Evaluation $PT Eval Moderate Complexity: 1 Mod PT Treatments $Therapeutic Activity: 8-22 mins        Anastasio Champion, DPT  Acute Rehabilitation Services Office [place] 12/10/2022, 10:15 AM

## 2022-12-10 NOTE — NC FL2 (Signed)
[place] LEVEL OF CARE FORM     IDENTIFICATION  Patient Name: Joseph [name] Birthdate: [birth year] Sex: male Admission Date (Current Location): 12/09/2022  [place] and [place] Number:  Engineering geologist and Address:  [facility], [address], [place], [place] [address]      Provider Number: [id number]  Attending Physician Name and Address:  Hollice Gong, Leslie Name and Phone Number:  Kostas, Marrow (Daughter) [phone] (Mobile)    Current Level of Care: Hospital Recommended Level of Care: [place] Prior Approval Number:    Date Approved/Denied:   PASRR Number: [id number]  Discharge Plan:      Current Diagnoses: Patient Active Problem List   Diagnosis Date Noted   Acute cerebrovascular accident (CVA) ([place]) 12/09/2022   Chronic combined systolic and diastolic CHF (congestive heart failure) ([place]) 12/09/2022   Fall at home, initial encounter 12/09/2022   Hypokalemia 12/09/2022   Skin tear of left hand without complication 28/78/6767   Pain of left hip 11/16/2022   Left hip pain 11/10/2022   Intertrigo 11/10/2022   COPD with acute exacerbation ([place]) 10/06/2022   Enlarged prostate 03/02/2022   Other specified counseling 03/02/2022   Renal calculi 03/02/2022   Type 2 diabetes mellitus without complication ([place]) 20/94/7096   Gastroesophageal reflux disease 03/02/2022   Hypertensive disorder 03/02/2022   Mixed hyperlipidemia 03/02/2022   Hypothyroidism 03/02/2022   Acute non-recurrent frontal sinusitis 01/05/2022   Glands swollen 01/05/2022   Hypertension associated with diabetes ([place]) 01/05/2022   Acute rhinitis 01/05/2022   AV block, Mobitz 1    NSTEMI (non-ST elevated myocardial infarction) [facility])    ACS (acute coronary syndrome) ([place]) 12/16/2019   Diverticulitis of large intestine without perforation or abscess without bleeding    Pain of upper abdomen    SOB (shortness of breath)     S/P CABG (coronary artery bypass graft)    Morbid obesity due to excess calories (HCC)    Back pain, chronic 07/28/2015   Benign fibroma of prostate 07/28/2015   Acid reflux 07/28/2015   Adult hypothyroidism 07/28/2015   Eunuchoidism 07/28/2015   Neuropathy 07/28/2015   Arthritis, degenerative 07/28/2015   Psoriasis 07/28/2015   Lumbar spondylosis 05/08/2015   Obesity 09/09/2014   Bilateral leg weakness 09/09/2014   Acute on chronic combined systolic and diastolic HF (heart failure) ([place]) 05/13/2014   Bilateral leg edema 04/15/2014   Bradycardia 08/13/2013   Edema 06/21/2012   Fatigue 09/15/2011   Type 2 diabetes mellitus with diabetic peripheral angiopathy without gangrene, with long-term current use of insulin ([place]) 09/15/2011   DYSPNEA 06/09/2009   Hyperlipidemia 06/07/2009   Essential hypertension 06/07/2009   Coronary artery disease 06/07/2009    Orientation RESPIRATION BLADDER Height & Weight     Self, Time, Situation, Place  Normal External catheter Weight: 205 lb 4 oz (93.1 kg) Height:  '5\' 7"'$  (170.2 cm)  BEHAVIORAL SYMPTOMS/MOOD NEUROLOGICAL BOWEL NUTRITION STATUS      Continent Diet (carb modified)  AMBULATORY STATUS COMMUNICATION OF NEEDS Skin   Limited Assist Verbally Skin abrasions, Bruising (lower medial right abdomen)                       Personal Care Assistance Level of Assistance  Bathing, Feeding, Dressing Bathing Assistance: Limited assistance Feeding assistance: Limited assistance Dressing Assistance: Limited assistance     Functional Limitations Info             [place]  PT (By licensed PT), OT (By licensed OT)     PT Frequency: 5 times per week OT Frequency: 5 times per week            Contractures      Additional Factors Info  Code Status, Allergies Code Status Info: full Allergies Info: Allergies: Farxiga (Dapagliflozin)           Current Medications (12/10/2022):  This is the current  hospital active medication list Current Facility-Administered Medications  Medication Dose Route Frequency Provider Last Rate Last Admin   acetaminophen (TYLENOL) tablet 650 mg  650 mg Oral Q4H PRN Hollice Gong, Mir Earlie Server, MD       Or   acetaminophen (TYLENOL) 160 MG/5ML solution 650 mg  650 mg Per Tube Q4H PRN Hollice Gong, Mir Earlie Server, MD       Or   acetaminophen (TYLENOL) suppository 650 mg  650 mg Rectal Q4H PRN Hollice Gong, Mir Earlie Server, MD       aspirin EC tablet 81 mg  81 mg Oral Daily Hollice Gong, Mir [place], MD   81 mg at 12/10/22 0820   atorvastatin (LIPITOR) tablet 80 mg  80 mg Oral Daily Hollice Gong, Mir Mohammed, MD   80 mg at 12/09/22 2218   clopidogrel (PLAVIX) tablet 75 mg  75 mg Oral Daily Hollice Gong, Mir Mohammed, MD   75 mg at 12/10/22 0820   enoxaparin (LOVENOX) injection 50 mg  0.5 mg/kg Subcutaneous Q24H Hollice Gong, Mir Mohammed, MD   50 mg at 12/09/22 2218   fenofibrate tablet 54 mg  54 mg Oral Daily Ikramullah, Mir Mohammed, MD       insulin aspart (novoLOG) injection 0-15 Units  0-15 Units Subcutaneous TID WC Tomma Rakers, MD   5 Units at 12/10/22 1208   insulin aspart (novoLOG) injection 0-5 Units  0-5 Units Subcutaneous QHS Hollice Gong, Mir [place], MD       insulin glargine-yfgn [facility]) injection 40 Units  40 Units Subcutaneous Daily Hollice Gong, Mir [place], MD   40 Units at 12/10/22 1023   levothyroxine (SYNTHROID) tablet 50 mcg  50 mcg Oral QAC breakfast Hollice Gong, Mir Mohammed, MD   50 mcg at 12/10/22 0531   nitroGLYCERIN (NITROSTAT) SL tablet 0.4 mg  0.4 mg Sublingual Q5 min PRN Hollice Gong, Mir Mohammed, MD       ondansetron [facility]) injection 4 mg  4 mg Intravenous Q6H PRN Hollice Gong, Mir Mohammed, MD       pantoprazole (PROTONIX) EC tablet 40 mg  40 mg Oral Daily Hollice Gong, Mir Mohammed, MD   40 mg at 12/10/22 0258   potassium chloride (KLOR-CON M) CR tablet 10 mEq  10 mEq Oral Daily Hollice Gong, Mir Mohammed, MD   10 mEq at 12/10/22 0820    torsemide (DEMADEX) tablet 20 mg  20 mg Oral Daily Hollice Gong, Mir Mohammed, MD   20 mg at 12/09/22 1610     Discharge Medications: Please see discharge summary for a list of discharge medications.  Relevant Imaging Results:  Relevant Lab Results:   Additional Information SS #: [id number]  [place], LCSW

## 2022-12-10 NOTE — TOC Initial Note (Signed)
Transition of Care [facility]) - Initial/Assessment Note    Patient Details  Name: Joseph [name] MRN: [id number] Date of Birth: 26-Jun-1925  [facility] CM/SW Contact:    Magnus Ivan, LCSW Phone Number: 12/10/2022, 3:59 PM  Clinical Narrative:                 Spoke to daughter Joseph [name] via phone regarding SNF recommendation. Patient lives home alone, patient has a sitter that comes Monday through Saturday from 830 to 1230. Joseph [name] stated the sitter only does housekeeping, does not help with ADLs. PCP is Dr. Alba Cory. Pharmacy is [facility] on [address]. No HH history. Joseph [name] stated there is a nurse who comes once a month to check vitals and talk to patient through his insurance.  Patient has a shower seat, RW, rollator, transport w/c, cane (was family members and he could use a new one per [facility]), and hand held shower head at home.  Lives- home alone, private aide - just for housework [phone] Monday thru Saturday, they don't do personal care. Joseph [name] stated they are agreeable to SNF. They do not want [facility] or [facility]. Joseph [name] stated their 1st choice is [facility] and 2nd choice is [facility]. She is not sure what her 3rd choice would be. Joseph [name] verbalized understanding that this is based on availability. CSW is starting SNF work up.   Expected Discharge Plan: Skilled Nursing Facility Barriers to Discharge: Continued Medical Work up   Patient Goals and CMS Choice Patient states their goals for this hospitalization and ongoing recovery are:: SNF CMS Medicare.gov Compare Post Acute Care list provided to:: Patient Represenative (must comment) Choice offered to / list presented to : Adult Children      Expected Discharge Plan and Services       Living arrangements for the past 2 months: Single Family Home                                      Prior Living Arrangements/Services Living arrangements for the past 2 months: Single Family Home Lives with::  Self Patient language and need for interpreter reviewed:: Yes Do you feel safe going back to the place where you live?: Yes      Need for Family Participation in Patient Care: Yes (Comment) Care giver support system in place?: Yes (comment) Current home services: Housekeeping Criminal Activity/Legal Involvement Pertinent to Current Situation/Hospitalization: No - Comment as needed  Activities of Daily Living Home Assistive Devices/Equipment: Cane (specify quad or straight) ADL Screening (condition at time of admission) Patient's cognitive ability adequate to safely complete daily activities?: Yes Is the patient deaf or have difficulty hearing?: No Does the patient have difficulty seeing, even when wearing glasses/contacts?: No Does the patient have difficulty concentrating, remembering, or making decisions?: No Patient able to express need for assistance with ADLs?: Yes Does the patient have difficulty dressing or bathing?: Yes Independently performs ADLs?: No Communication: Independent Dressing (OT): Needs assistance Is this a change from baseline?: Change from baseline, expected to last <3days Grooming: Needs assistance Is this a change from baseline?: Change from baseline, expected to last <3 days Feeding: Independent Bathing: Needs assistance Is this a change from baseline?: Pre-admission baseline Toileting: Needs assistance Is this a change from baseline?: Change from baseline, expected to last <3 days In/Out Bed: Needs assistance Is this a change from baseline?: Change from baseline, expected to last <3 days  Walks in Home: Needs assistance Is this a change from baseline?: Pre-admission baseline Does the patient have difficulty walking or climbing stairs?: Yes Weakness of Legs: Right Weakness of Arms/Hands: Right  Permission Sought/Granted Permission sought to share information with : Facility Art therapist granted to share information with : Yes, Verbal  Permission Granted (by daughter)     Permission granted to share info w AGENCY: SNFs        Emotional Assessment         Alcohol / Substance Use: Not Applicable Psych Involvement: No (comment)  Admission diagnosis:  Acute CVA (cerebrovascular accident) ([place]) [I63.9] Acute cerebrovascular accident (CVA) ([place]) [I63.9] Patient Active Problem List   Diagnosis Date Noted   Acute cerebrovascular accident (CVA) ([place]) 12/09/2022   Chronic combined systolic and diastolic CHF (congestive heart failure) ([place]) 12/09/2022   Fall at home, initial encounter 12/09/2022   Hypokalemia 12/09/2022   Skin tear of left hand without complication 94/85/4627   Pain of left hip 11/16/2022   Left hip pain 11/10/2022   Intertrigo 11/10/2022   COPD with acute exacerbation ([place]) 10/06/2022   Enlarged prostate 03/02/2022   Other specified counseling 03/02/2022   Renal calculi 03/02/2022   Type 2 diabetes mellitus without complication ([place]) 03/50/0938   Gastroesophageal reflux disease 03/02/2022   Hypertensive disorder 03/02/2022   Mixed hyperlipidemia 03/02/2022   Hypothyroidism 03/02/2022   Acute non-recurrent frontal sinusitis 01/05/2022   Glands swollen 01/05/2022   Hypertension associated with diabetes ([place]) 01/05/2022   Acute rhinitis 01/05/2022   AV block, Mobitz 1    NSTEMI (non-ST elevated myocardial infarction) ([place])    ACS (acute coronary syndrome) ([place]) 12/16/2019   Diverticulitis of large intestine without perforation or abscess without bleeding    Pain of upper abdomen    SOB (shortness of breath)    S/P CABG (coronary artery bypass graft)    Morbid obesity due to excess calories (HCC)    Back pain, chronic 07/28/2015   Benign fibroma of prostate 07/28/2015   Acid reflux 07/28/2015   Adult hypothyroidism 07/28/2015   Eunuchoidism 07/28/2015   Neuropathy 07/28/2015   Arthritis, degenerative 07/28/2015   Psoriasis 07/28/2015   Lumbar spondylosis 05/08/2015   Obesity 09/09/2014    Bilateral leg weakness 09/09/2014   Acute on chronic combined systolic and diastolic HF (heart failure) ([place]) 05/13/2014   Bilateral leg edema 04/15/2014   Bradycardia 08/13/2013   Edema 06/21/2012   Fatigue 09/15/2011   Type 2 diabetes mellitus with diabetic peripheral angiopathy without gangrene, with long-term current use of insulin ([place]) 09/15/2011   DYSPNEA 06/09/2009   Hyperlipidemia 06/07/2009   Essential hypertension 06/07/2009   Coronary artery disease 06/07/2009   PCP:  [name], MD Pharmacy:   [facility] [address], [place] - [place] [address] [place] [address] Phone: [phone] Fax: [phone]     Social Determinants of Health (SDOH) Social History: SDOH Screenings   Food Insecurity: No Food Insecurity (12/09/2022)  Housing: Low Risk  (12/09/2022)  Transportation Needs: No Transportation Needs (12/09/2022)  Utilities: Not At Risk (12/09/2022)  Alcohol Screen: Low Risk  (03/02/2022)  Depression (PHQ2-9): Low Risk  (11/10/2022)  Financial Resource Strain: Low Risk  (03/02/2022)  Physical Activity: Insufficiently Active (03/02/2022)  Social Connections: Moderately Integrated (03/02/2022)  Stress: No Stress Concern Present (03/02/2022)  Tobacco Use: Medium Risk (12/09/2022)   SDOH Interventions:     Readmission Risk Interventions    12/10/2022    3:58 PM  Readmission Risk Prevention Plan  Transportation Screening Complete  PCP or Specialist Appt within 5-7 Days Complete  Home Care Screening Complete  Medication Review (RN CM) Complete

## 2022-12-11 ENCOUNTER — Inpatient Hospital Stay
Admit: 2022-12-11 | Discharge: 2022-12-11 | Disposition: A | Discharge status: Home / Self Care | Payer: HMO | Attending: Internal Medicine | Admitting: Internal Medicine

## 2022-12-11 DIAGNOSIS — I639 Cerebral infarction, unspecified: Secondary | ICD-10-CM | POA: Diagnosis not present

## 2022-12-11 LAB — BASIC METABOLIC PANEL
Anion gap: 7 (ref 5–15)
BUN: 14 mg/dL (ref 8–23)
CO2: 24 mmol/L (ref 22–32)
Calcium: 8.4 mg/dL — ABNORMAL LOW (ref 8.9–10.3)
Chloride: 107 mmol/L (ref 98–111)
Creatinine, Ser: 0.93 mg/dL (ref 0.61–1.24)
GFR, Estimated: >60 mL/min (ref >60)
Glucose, Bld: 211 mg/dL — ABNORMAL HIGH (ref 70–99)
Potassium: 3.3 mmol/L — ABNORMAL LOW (ref 3.5–5.1)
Sodium: 138 mmol/L (ref 135–145)

## 2022-12-11 LAB — ECHOCARDIOGRAM COMPLETE
AR max vel: 1.8 cm2
AV Area VTI: 2.23 cm2
AV Area mean vel: 1.71 cm2
AV Mean grad: 5.5 mmHg
AV Peak grad: 10 mmHg
Ao pk vel: 1.59 m/s
Area-P 1/2: 3.51 cm2
Calc EF: 52.4 %
Height: 67 in
S' Lateral: 3.8 cm
Single Plane A2C EF: 61 %
Single Plane A4C EF: 43.7 %
Weight: 3283.97 oz

## 2022-12-11 LAB — GLUCOSE, CAPILLARY
Glucose-Capillary: 230 mg/dL — ABNORMAL HIGH (ref 70–99)
Glucose-Capillary: 266 mg/dL — ABNORMAL HIGH (ref 70–99)
Glucose-Capillary: 213 mg/dL — ABNORMAL HIGH (ref 70–99)
Glucose-Capillary: 177 mg/dL — ABNORMAL HIGH (ref 70–99)

## 2022-12-11 LAB — CBC
HCT: 36 % — ABNORMAL LOW (ref 39.0–52.0)
Hemoglobin: 12.5 g/dL — ABNORMAL LOW (ref 13.0–17.0)
MCH: 31 pg (ref 26.0–34.0)
MCHC: 34.7 g/dL (ref 30.0–36.0)
MCV: 89.3 fL (ref 80.0–100.0)
Platelets: 175 10*3/uL (ref 150–400)
RBC: 4.03 MIL/uL — ABNORMAL LOW (ref 4.22–5.81)
RDW: 13.1 % (ref 11.5–15.5)
WBC: 5.6 10*3/uL (ref 4.0–10.5)
nRBC: 0 % (ref 0.0–0.2)

## 2022-12-11 MED ORDER — INSULIN GLARGINE-YFGN 100 UNIT/ML ~~LOC~~ SOLN
50.0000 [IU] | Freq: Every day | SUBCUTANEOUS | Status: DC
  Administered 2022-12-11 – 2022-12-14 (×4): 50 [IU] via SUBCUTANEOUS
  Filled 2022-12-11 (×4): qty 0.5

## 2022-12-11 MED ORDER — POTASSIUM CHLORIDE CRYS ER 20 MEQ PO TBCR
40.0000 meq | EXTENDED_RELEASE_TABLET | Freq: Once | ORAL | Status: AC
  Administered 2022-12-11: 40 meq via ORAL
  Filled 2022-12-11: qty 2

## 2022-12-11 MED ORDER — LOSARTAN POTASSIUM 50 MG PO TABS
50.0000 mg | ORAL_TABLET | Freq: Every day | ORAL | Status: DC
  Administered 2022-12-11 – 2022-12-12 (×2): 50 mg via ORAL
  Filled 2022-12-11 (×2): qty 1

## 2022-12-11 MED ORDER — PERFLUTREN LIPID MICROSPHERE
1.0000 mL | INTRAVENOUS | Status: AC | PRN
  Administered 2022-12-11: 3 mL via INTRAVENOUS

## 2022-12-11 NOTE — Progress Notes (Signed)
Mobility Specialist - Progress Note   12/11/22 1418  Mobility  Activity Ambulated with assistance in room  Level of Assistance +2 (takes two people)  Games developer wheel walker  Distance Ambulated (ft) 3 ft  Activity Response Tolerated well  Mobility Referral Yes  $Mobility charge 1 Mobility   PT sitting in recliner on RA upon arrival. PT STS and transfers from recliner to bed Max + 2. Pt needs ModA to reposition in bed. Pt left semi-supine in bed with needs in reach, daughter in room, and bed alarm set.   Gretchen Short  Mobility Specialist  12/11/22 2:23 PM

## 2022-12-11 NOTE — Progress Notes (Signed)
Telemetry called and said patients HR was running from 30s - 70s. Now sustaining in the 63s. Patient asymptomatic. Made MD aware. No new orders.

## 2022-12-11 NOTE — TOC Progression Note (Addendum)
Transition of Care [facility]) - Progression Note    Patient Details  Name: Joseph [name] MRN: [id number] Date of Birth: 1925/06/04  Transition of Care [facility]) CM/SW [place], LCSW Phone Number: 12/11/2022, 8:35 AM  Clinical Narrative:    CSW reached out to [place] in Admissions at [facility] and Seth Bake in Admissions at [facility] and requested they review bed request. -Per Seth Bake at [facility], it will depend on her bed availability which she will need to check 1/22 morning, as of Friday she states she did not have beds. -Waiting to hear back from [place] at [facility].  Updated daughter Olegario Shearer.   Expected Discharge Plan: [place] Barriers to Discharge: Continued Medical Work up  Expected Discharge Plan and Services       Living arrangements for the past 2 months: Single Family Home                                       Social Determinants of Health (SDOH) Interventions SDOH Screenings   Food Insecurity: No Food Insecurity (12/09/2022)  Housing: Low Risk  (12/09/2022)  Transportation Needs: No Transportation Needs (12/09/2022)  Utilities: Not At Risk (12/09/2022)  Alcohol Screen: Low Risk  (03/02/2022)  Depression (PHQ2-9): Low Risk  (11/10/2022)  Financial Resource Strain: Low Risk  (03/02/2022)  Physical Activity: Insufficiently Active (03/02/2022)  Social Connections: Moderately Integrated (03/02/2022)  Stress: No Stress Concern Present (03/02/2022)  Tobacco Use: Medium Risk (12/09/2022)    Readmission Risk Interventions    12/10/2022    3:58 PM  Readmission Risk Prevention Plan  Transportation Screening Complete  PCP or Specialist Appt within 5-7 Days Complete  Home Care Screening Complete  Medication Review (RN CM) Complete

## 2022-12-11 NOTE — Progress Notes (Signed)
Mobility Specialist - Progress Note   12/11/22 1039  Mobility  Activity Ambulated with assistance in room;Transferred to/from [facility]  Level of Assistance +2 (takes two people)  Assistive Device None  Distance Ambulated (ft) 2 ft  Activity Response Tolerated well  Mobility Referral Yes  $Mobility charge 1 Mobility   Pt sitting in recliner on RA requesting transfer to [facility]. Pt STS and SPT to/from [facility] with Max +2 and LOB corrected by author. Pt returns to recliner with needs in reach and chair alarm set.   Gretchen Short  Mobility Specialist  12/11/22 10:40 AM

## 2022-12-11 NOTE — Progress Notes (Signed)
PROGRESS NOTE  Joseph [name] FTD:[id number] DOB: [birth year] DOA: 12/09/2022 PCP: [name], MD  Hospital Course/Subjective: Joseph [name] is a 89 y.o. male with medical history significant for hypertension, hyperlipidemia, insulin-dependent type 2 diabetes, coronary artery disease status post CABG about 40 years ago who is admitted to the hospitalist service for evaluation and management of acute left anterior choroidal artery stroke seen on MRI 1/19. He was started on DAPT (on Plavix at home), and had new right facial numbness and right hand weakness early 1/20.  Repeat head CT without significant change.  He was seen in consultation by neurology, he remained stable and therapy services are recommending rehab.  Assessment/Plan: Acute cerebrovascular accident (CVA) ([place]) -acute left anterior choroidal artery stroke seen on MRI 1/19 -Inpatient admission, with acute CVA order set utilized -Continuous telemetry for 48 hours; if no events will need 30-day outpatient cardiac event monitor -Per neurology recommendations, will gradually start resuming his blood pressure medications, will start losartan 50 mg today -continue ASA x 21 days and Plavix  -Check 2D echo (pending), CTA head and neck without LVO -Triglycerides not at goal, started fibrate, A1c noted above goal -Discussed with neurohospitalist Dr. Parke Simmers, appreciate her input   Hyperlipidemia -continue statin, LDL at goal but HDL low and TG 215, started fibrate Essential hypertension -resume losartan 50 mg daily, takes 100 mg at home gradually increase as want to avoid hypotension Coronary artery disease -continue home Plavix, add aspirin as above for 21 days Type 2 diabetes mellitus with diabetic peripheral angiopathy without gangrene, with long-term current use of insulin (HCC) -Diabetic diet when eating -Hemoglobin A1c above goal -Continue home Lantus will increase basal insulin since sugars elevated-Sliding  scale insulin   S/P CABG (coronary artery bypass graft)   Chronic combined systolic and diastolic CHF (congestive heart failure) (HCC) -no evidence of acute exacerbation, patient appears euvolemic on exam, will continue torsemide and potassium supplementation   Fall at home, initial encounter   Hypokalemia   DVT prophylaxis: Lovenox   Code Status: Full code, confirmed with patient and his son at the bedside of the time of hospital admission.   Family Communication: Discussed with patient this AM, no family present.   Disposition Plan: Anticipate subacute nursing facility placement at discharge.  Patient will be medically ready tomorrow 1/22   Consults called: Neurohospitalist   Admission status: Inpatient  Antimicrobials: Anti-infectives (From admission, onward)    None       Objective: Vitals:   12/11/22 0025 12/11/22 0417 12/11/22 0736 12/11/22 0800  BP: (!) 143/66 (!) 140/69 (!) 142/69   Pulse: 94 63 (!) 52 62  Resp: '18 16 16   '$ Temp: 98.4 F (36.9 C) 98.2 F (36.8 C) 97.6 F (36.4 C)   TempSrc: Oral Oral    SpO2: 95% 96% 96%   Weight:      Height:        Intake/Output Summary (Last 24 hours) at 12/11/2022 0847 Last data filed at 12/10/2022 2100 Gross per 24 hour  Intake --  Output 400 ml  Net -400 ml   Filed Weights   12/09/22 0956 12/09/22 2040  Weight: 99.8 kg 93.1 kg   Exam: General:  Alert, oriented, calm, in no acute distress, clear speech, resting in bed on room air Eyes: EOMI, clear conjuctivae, white sclerea Neck: supple, no masses, trachea mildline  Cardiovascular: RRR, no murmurs or rubs, no peripheral edema  Respiratory: clear to auscultation bilaterally, no wheezes, no crackles  Abdomen: soft, nontender,  nondistended, normal bowel tones heard  Skin: dry, no rashes  Musculoskeletal: no joint effusions, normal range of motion  Psychiatric: appropriate affect, normal speech  Neurologic: extraocular muscles intact, clear speech, moving all  extremities with intact sensorium  Data Reviewed: CBC: Recent Labs  Lab 12/09/22 1040 12/10/22 0605 12/11/22 0646  WBC 6.5 6.2 5.6  HGB 14.4 12.5* 12.5*  HCT 42.0 37.3* 36.0*  MCV 89.0 89.2 89.3  PLT 204 184 409    Basic Metabolic Panel: Recent Labs  Lab 12/09/22 1040 12/10/22 0605 12/11/22 0646  NA 138 139 138  K 3.3* 3.1* 3.3*  CL 104 104 107  CO2 '24 26 24  '$ GLUCOSE 242* 192* 211*  BUN '16 15 14  '$ CREATININE 1.03 0.97 0.93  CALCIUM 8.8* 8.5* 8.4*    GFR: Estimated Creatinine Clearance: 51.7 mL/min (by C-G formula based on SCr of 0.93 mg/dL). Liver Function Tests: No results for input(s): "AST", "ALT", "ALKPHOS", "BILITOT", "PROT", "ALBUMIN" in the last 168 hours. No results for input(s): "LIPASE", "AMYLASE" in the last 168 hours. No results for input(s): "AMMONIA" in the last 168 hours. Coagulation Profile: No results for input(s): "INR", "PROTIME" in the last 168 hours. Cardiac Enzymes: No results for input(s): "CKTOTAL", "CKMB", "CKMBINDEX", "TROPONINI" in the last 168 hours. BNP (last 3 results) No results for input(s): "PROBNP" in the last 8760 hours. HbA1C: Recent Labs    12/09/22 2034  HGBA1C 7.4*    CBG: Recent Labs  Lab 12/10/22 0734 12/10/22 1202 12/10/22 1601 12/10/22 2012 12/11/22 0737  GLUCAP 203* 213* 260* 238* 230*    Lipid Profile: Recent Labs    12/10/22 0605  CHOL 121  HDL 26*  LDLCALC 52  TRIG 215*  CHOLHDL 4.7    Thyroid Function Tests: No results for input(s): "TSH", "T4TOTAL", "FREET4", "T3FREE", "THYROIDAB" in the last 72 hours. Anemia Panel: No results for input(s): "VITAMINB12", "FOLATE", "FERRITIN", "TIBC", "IRON", "RETICCTPCT" in the last 72 hours. Urine analysis:    Component Value Date/Time   COLORURINE YELLOW (A) 12/10/2022 0115   APPEARANCEUR CLEAR (A) 12/10/2022 0115   LABSPEC 1.027 12/10/2022 0115   PHURINE 5.0 12/10/2022 0115   GLUCOSEU NEGATIVE 12/10/2022 0115   HGBUR NEGATIVE 12/10/2022 0115    BILIRUBINUR NEGATIVE 12/10/2022 0115   BILIRUBINUR negative 02/10/2020 1600   KETONESUR NEGATIVE 12/10/2022 0115   PROTEINUR NEGATIVE 12/10/2022 0115   UROBILINOGEN 0.2 02/10/2020 1600   NITRITE NEGATIVE 12/10/2022 0115   LEUKOCYTESUR NEGATIVE 12/10/2022 0115   Sepsis Labs: '@LABRCNTIP'$ (procalcitonin:4,lacticidven:4)  )No results found for this or any previous visit (from the past 240 hour(s)).   Studies: No results found.  Scheduled Meds:  aspirin EC  81 mg Oral Daily   atorvastatin  80 mg Oral Daily   clopidogrel  75 mg Oral Daily   enoxaparin (LOVENOX) injection  0.5 mg/kg Subcutaneous Q24H   fenofibrate  54 mg Oral Daily   insulin aspart  0-15 Units Subcutaneous TID WC   insulin aspart  0-5 Units Subcutaneous QHS   insulin glargine-yfgn  50 Units Subcutaneous Daily   levothyroxine  50 mcg Oral QAC breakfast   losartan  50 mg Oral Daily   pantoprazole  40 mg Oral Daily   potassium chloride  10 mEq Oral Daily   potassium chloride  40 mEq Oral Once   torsemide  20 mg Oral Daily    Continuous Infusions:   LOS: 2 days   Time spent: 31 minutes  Trudi Morgenthaler Marry Guan, MD [facility] Pager [phone]  If 7PM-7AM,  please contact night-coverage [email or web address] Password [facility] 12/11/2022, 8:47 AM

## 2022-12-11 NOTE — Progress Notes (Signed)
Mobility Specialist - Progress Note   12/11/22 0934  Mobility  Activity Ambulated with assistance in room;Stood at bedside;Dangled on edge of bed;Transferred from bed to chair  Level of Assistance +2 (takes two people)  Games developer wheel walker  Distance Ambulated (ft) 2 ft  Activity Response Tolerated well  Mobility Referral Yes  $Mobility charge 1 Mobility   Pt semi-supine in bed on RA upon arrival. Pt completes bed mobility MinA and STS and SPT from chair to bed ModA +2. Pt left in recliner with needs in reach and chair alarm set.   Gretchen Short  Mobility Specialist  12/11/22 9:36 AM

## 2022-12-11 NOTE — Plan of Care (Signed)
ECHO:   1. Left ventricular ejection fraction, by estimation, is 55 to 60%. The  left ventricle has normal function. The left ventricle has no regional  wall motion abnormalities. Left ventricular diastolic parameters are  consistent with Grade I diastolic  dysfunction (impaired relaxation).   2. Right ventricular systolic function is normal. The right ventricular  size is normal.   3. The mitral valve is normal in structure. Mild mitral valve  regurgitation. No evidence of mitral stenosis.   4. The aortic valve is normal in structure. Aortic valve regurgitation is  not visualized. Mild aortic valve stenosis.   5. The inferior vena cava is normal in size with greater than 50%  respiratory variability, suggesting right atrial pressure of 3 mmHg.   [Normal biatrial sizes]  No change to previously documented plan  No charge note

## 2022-12-12 DIAGNOSIS — I639 Cerebral infarction, unspecified: Secondary | ICD-10-CM | POA: Diagnosis not present

## 2022-12-12 LAB — CBC
HCT: 35.7 % — ABNORMAL LOW (ref 39.0–52.0)
Hemoglobin: 12.1 g/dL — ABNORMAL LOW (ref 13.0–17.0)
MCH: 30.5 pg (ref 26.0–34.0)
MCHC: 33.9 g/dL (ref 30.0–36.0)
MCV: 89.9 fL (ref 80.0–100.0)
Platelets: 185 10*3/uL (ref 150–400)
RBC: 3.97 MIL/uL — ABNORMAL LOW (ref 4.22–5.81)
RDW: 13 % (ref 11.5–15.5)
WBC: 6.1 10*3/uL (ref 4.0–10.5)
nRBC: 0 % (ref 0.0–0.2)

## 2022-12-12 LAB — BASIC METABOLIC PANEL
Anion gap: 8 (ref 5–15)
BUN: 15 mg/dL (ref 8–23)
CO2: 25 mmol/L (ref 22–32)
Calcium: 8.5 mg/dL — ABNORMAL LOW (ref 8.9–10.3)
Chloride: 107 mmol/L (ref 98–111)
Creatinine, Ser: 0.96 mg/dL (ref 0.61–1.24)
GFR, Estimated: >60 mL/min (ref >60)
Glucose, Bld: 136 mg/dL — ABNORMAL HIGH (ref 70–99)
Potassium: 3.2 mmol/L — ABNORMAL LOW (ref 3.5–5.1)
Sodium: 140 mmol/L (ref 135–145)

## 2022-12-12 LAB — GLUCOSE, CAPILLARY
Glucose-Capillary: 137 mg/dL — ABNORMAL HIGH (ref 70–99)
Glucose-Capillary: 208 mg/dL — ABNORMAL HIGH (ref 70–99)
Glucose-Capillary: 249 mg/dL — ABNORMAL HIGH (ref 70–99)
Glucose-Capillary: 206 mg/dL — ABNORMAL HIGH (ref 70–99)

## 2022-12-12 LAB — MAGNESIUM: Magnesium: 1.7 mg/dL (ref 1.7–2.4)

## 2022-12-12 MED ORDER — POTASSIUM CHLORIDE CRYS ER 20 MEQ PO TBCR
40.0000 meq | EXTENDED_RELEASE_TABLET | Freq: Once | ORAL | Status: AC
  Administered 2022-12-12: 40 meq via ORAL
  Filled 2022-12-12: qty 2

## 2022-12-12 MED ORDER — FENOFIBRATE 54 MG PO TABS: 54.0000 mg | ORAL_TABLET | Freq: Every day | ORAL | 2 refills | Status: DC

## 2022-12-12 MED ORDER — LOSARTAN POTASSIUM 50 MG PO TABS
100.0000 mg | ORAL_TABLET | Freq: Every day | ORAL | Status: DC
  Administered 2022-12-13 – 2022-12-14 (×2): 100 mg via ORAL
  Filled 2022-12-12 (×2): qty 2

## 2022-12-12 MED ORDER — POTASSIUM CHLORIDE CRYS ER 10 MEQ PO TBCR: 20.0000 meq | EXTENDED_RELEASE_TABLET | Freq: Every day | ORAL | 0 refills | Status: DC

## 2022-12-12 MED ORDER — ASPIRIN 81 MG PO TBEC: 81.0000 mg | DELAYED_RELEASE_TABLET | Freq: Every day | ORAL | 0 refills | Status: AC

## 2022-12-12 MED ORDER — INSULIN GLARGINE-YFGN 100 UNIT/ML ~~LOC~~ SOLN: 50.0000 [IU] | Freq: Every day | SUBCUTANEOUS | 11 refills | Status: DC

## 2022-12-12 NOTE — Care Management Important Message (Signed)
Important Message  Patient Details  Name: Joseph [name] MRN: [id number] Date of Birth: March 06, 1925   Medicare Important Message Given:  N/A - LOS <3 / Initial given by admissions     [name] Weight 12/12/2022, 8:27 AM

## 2022-12-12 NOTE — TOC Progression Note (Addendum)
Transition of Care [facility]) - Progression Note    Patient Details  Name: KAYODE PETION MRN: [id number] Date of Birth: 05/02/1925  Transition of Care [facility]) CM/SW Contact  Gerilyn Pilgrim, LCSW Phone Number: 12/12/2022, 9:58 AM  Clinical Narrative:   HTA auth started for patient to go to [facility]  2:58 Auth approved auth number [id number] and Ems approved [id number]. SW will notify facility.    Expected Discharge Plan: [place] Barriers to Discharge: Continued Medical Work up  Expected Discharge Plan and Services       Living arrangements for the past 2 months: Single Family Home                                       Social Determinants of Health (SDOH) Interventions SDOH Screenings   Food Insecurity: No Food Insecurity (12/09/2022)  Housing: Low Risk  (12/09/2022)  Transportation Needs: No Transportation Needs (12/09/2022)  Utilities: Not At Risk (12/09/2022)  Alcohol Screen: Low Risk  (03/02/2022)  Depression (PHQ2-9): Low Risk  (11/10/2022)  Financial Resource Strain: Low Risk  (03/02/2022)  Physical Activity: Insufficiently Active (03/02/2022)  Social Connections: Moderately Integrated (03/02/2022)  Stress: No Stress Concern Present (03/02/2022)  Tobacco Use: Medium Risk (12/09/2022)    Readmission Risk Interventions    12/10/2022    3:58 PM  Readmission Risk Prevention Plan  Transportation Screening Complete  PCP or Specialist Appt within 5-7 Days Complete  Home Care Screening Complete  Medication Review (RN CM) Complete

## 2022-12-12 NOTE — Progress Notes (Signed)
PROGRESS NOTE  DOMINIQ [name] BJY:[id number] DOB: 04-11-1925 DOA: 12/09/2022 PCP: [name], MD  Hospital Course/Subjective: Joseph [name] is a 89 y.o. male with medical history significant for hypertension, hyperlipidemia, insulin-dependent type 2 diabetes, coronary artery disease status post CABG about 40 years ago who is admitted to the hospitalist service for evaluation and management of acute left anterior choroidal artery stroke seen on MRI 1/19. He was started on DAPT (on Plavix at home), and had new right facial numbness and right hand weakness early 1/20.  Repeat head CT without significant change.  He was seen in consultation by neurology, he remained stable and therapy services are recommending rehab.  Seen and examined this morning, eating breakfast doing well.  Still has some numbness in the right arm.  Seen with family member at the bedside, he is anticipating discharge to rehab soon.  Assessment/Plan: Acute cerebrovascular accident (CVA) ([place]) -acute left anterior choroidal artery stroke seen on MRI 1/19 -Inpatient admission, with acute CVA order set utilized -Continuous telemetry for 48 hours; since no events we will plan on arranging outpatient 30-day event monitor -Per neurology recommendations, okay to resume blood pressure control will resume his home losartan 50 mg p.o. daily -continue ASA x 21 days and Plavix  -Check 2D echo unremarkable, CTA head and neck without LVO -Triglycerides not at goal, started fibrate, A1c noted above goal -Discussed with neurohospitalist Dr. Parke Simmers, appreciate her input   Hyperlipidemia -continue statin, LDL at goal but HDL low and TG 215, started fibrate Essential hypertension -resume losartan 100 daily Coronary artery disease -continue home Plavix, add aspirin as above for 21 days Type 2 diabetes mellitus with diabetic peripheral angiopathy without gangrene, with long-term current use of insulin ([place]) -Diabetic diet  when eating -Hemoglobin A1c above goal -Continue home Lantus will increase basal insulin since sugars elevated-Sliding scale insulin   S/P CABG (coronary artery bypass graft)   Chronic combined systolic and diastolic CHF (congestive heart failure) (HCC) -no evidence of acute exacerbation, patient appears euvolemic on exam, will continue torsemide and potassium supplementation   Fall at home, initial encounter   Hypokalemia   DVT prophylaxis: Lovenox   Code Status: Full code, confirmed with patient and his son at the bedside of the time of hospital admission.   Family Communication: Discussed with patient this AM, with family member present.   Disposition Plan: Anticipate subacute nursing facility placement at discharge.  Patient is medically ready and insurance authorization being initiated.   Consults called: Neurohospitalist   Admission status: Inpatient  Antimicrobials: Anti-infectives (From admission, onward)    None       Objective: Vitals:   12/11/22 1954 12/12/22 0009 12/12/22 0418 12/12/22 0725  BP: (!) 157/91 (!) 118/53 (!) 164/58 (!) 166/82  Pulse: (!) 56 (!) 55 (!) 56 84  Resp: '20 20 20 18  '$ Temp: 98.8 F (37.1 C) 98.6 F (37 C) 98.7 F (37.1 C) 97.9 F (36.6 C)  TempSrc: Oral Oral Oral   SpO2: 93% 96% 96% 97%  Weight:      Height:        Intake/Output Summary (Last 24 hours) at 12/12/2022 0920 Last data filed at 12/12/2022 0865 Gross per 24 hour  Intake --  Output 600 ml  Net -600 ml    Filed Weights   12/09/22 0956 12/09/22 2040  Weight: 99.8 kg 93.1 kg   Exam: General:  Alert, oriented, calm, in no acute distress, sitting up in bed having breakfast on room air Eyes:  EOMI, clear conjuctivae, white sclerea Neck: supple, no masses, trachea mildline  Cardiovascular: RRR, no murmurs or rubs, no peripheral edema  Respiratory: clear to auscultation bilaterally, no wheezes, no crackles  Abdomen: soft, nontender, nondistended, normal bowel tones heard   Skin: dry, no rashes  Musculoskeletal: no joint effusions, normal range of motion  Psychiatric: appropriate affect, normal speech  Neurologic: extraocular muscles intact, clear speech, moving all extremities with intact sensorium  Data Reviewed: CBC: Recent Labs  Lab 12/09/22 1040 12/10/22 0605 12/11/22 0646 12/12/22 0527  WBC 6.5 6.2 5.6 6.1  HGB 14.4 12.5* 12.5* 12.1*  HCT 42.0 37.3* 36.0* 35.7*  MCV 89.0 89.2 89.3 89.9  PLT 204 184 175 810    Basic Metabolic Panel: Recent Labs  Lab 12/09/22 1040 12/10/22 0605 12/11/22 0646 12/12/22 0527  NA 138 139 138 140  K 3.3* 3.1* 3.3* 3.2*  CL 104 104 107 107  CO2 '24 26 24 25  '$ GLUCOSE 242* 192* 211* 136*  BUN '16 15 14 15  '$ CREATININE 1.03 0.97 0.93 0.96  CALCIUM 8.8* 8.5* 8.4* 8.5*  MG  --   --   --  1.7    GFR: Estimated Creatinine Clearance: 50.1 mL/min (by C-G formula based on SCr of 0.96 mg/dL). Liver Function Tests: No results for input(s): "AST", "ALT", "ALKPHOS", "BILITOT", "PROT", "ALBUMIN" in the last 168 hours. No results for input(s): "LIPASE", "AMYLASE" in the last 168 hours. No results for input(s): "AMMONIA" in the last 168 hours. Coagulation Profile: No results for input(s): "INR", "PROTIME" in the last 168 hours. Cardiac Enzymes: No results for input(s): "CKTOTAL", "CKMB", "CKMBINDEX", "TROPONINI" in the last 168 hours. BNP (last 3 results) No results for input(s): "PROBNP" in the last 8760 hours. HbA1C: Recent Labs    12/09/22 2034  HGBA1C 7.4*    CBG: Recent Labs  Lab 12/11/22 0737 12/11/22 1130 12/11/22 1652 12/11/22 2135 12/12/22 0725  GLUCAP 230* 266* 213* 177* 137*    Lipid Profile: Recent Labs    12/10/22 0605  CHOL 121  HDL 26*  LDLCALC 52  TRIG 215*  CHOLHDL 4.7    Thyroid Function Tests: No results for input(s): "TSH", "T4TOTAL", "FREET4", "T3FREE", "THYROIDAB" in the last 72 hours. Anemia Panel: No results for input(s): "VITAMINB12", "FOLATE", "FERRITIN", "TIBC",  "IRON", "RETICCTPCT" in the last 72 hours. Urine analysis:    Component Value Date/Time   COLORURINE YELLOW (A) 12/10/2022 0115   APPEARANCEUR CLEAR (A) 12/10/2022 0115   LABSPEC 1.027 12/10/2022 0115   PHURINE 5.0 12/10/2022 0115   GLUCOSEU NEGATIVE 12/10/2022 0115   HGBUR NEGATIVE 12/10/2022 0115   BILIRUBINUR NEGATIVE 12/10/2022 0115   BILIRUBINUR negative 02/10/2020 1600   KETONESUR NEGATIVE 12/10/2022 0115   PROTEINUR NEGATIVE 12/10/2022 0115   UROBILINOGEN 0.2 02/10/2020 1600   NITRITE NEGATIVE 12/10/2022 0115   LEUKOCYTESUR NEGATIVE 12/10/2022 0115   Sepsis Labs: '@LABRCNTIP'$ (procalcitonin:4,lacticidven:4)  )No results found for this or any previous visit (from the past 240 hour(s)).   Studies: ECHOCARDIOGRAM COMPLETE  Result Date: 12/11/2022    ECHOCARDIOGRAM REPORT   Patient Name:   JHALIL SILVERA Date of Exam: 12/11/2022 Medical Rec #:  [id number]         Height:       67.0 in Accession #:    [id number]        Weight:       205.2 lb Date of Birth:  Feb 18, 1925        BSA:          2.045  m Patient Age:    89 years          BP:           143/66 mmHg Patient Gender: M                 HR:           100 bpm. Exam Location:  [facility] Procedure: 2D Echo, Color Doppler, Cardiac Doppler and Intracardiac            Opacification Agent Indications:     Stroke  History:         Patient has prior history of Echocardiogram examinations. DCHF,                  CAD; Risk Factors:Hypertension and Diabetes.  Sonographer:     Wallace Keller Thornton-Maynard Referring Phys:  [id number] [place] Diagnosing Phys: Isaias Cowman MD  Sonographer Comments: Suboptimal apical window. Image acquisition challenging due to patient body habitus. IMPRESSIONS  1. Left ventricular ejection fraction, by estimation, is 55 to 60%. The left ventricle has normal function. The left ventricle has no regional wall motion abnormalities. Left ventricular diastolic parameters are consistent with Grade I diastolic  dysfunction (impaired relaxation).  2. Right ventricular systolic function is normal. The right ventricular size is normal.  3. The mitral valve is normal in structure. Mild mitral valve regurgitation. No evidence of mitral stenosis.  4. The aortic valve is normal in structure. Aortic valve regurgitation is not visualized. Mild aortic valve stenosis.  5. The inferior vena cava is normal in size with greater than 50% respiratory variability, suggesting right atrial pressure of 3 mmHg. FINDINGS  Left Ventricle: Left ventricular ejection fraction, by estimation, is 55 to 60%. The left ventricle has normal function. The left ventricle has no regional wall motion abnormalities. Definity contrast agent was given IV to delineate the left ventricular  endocardial borders. The left ventricular internal cavity size was normal in size. There is no left ventricular hypertrophy. Left ventricular diastolic parameters are consistent with Grade I diastolic dysfunction (impaired relaxation). Right Ventricle: The right ventricular size is normal. No increase in right ventricular wall thickness. Right ventricular systolic function is normal. Left Atrium: Left atrial size was normal in size. Right Atrium: Right atrial size was normal in size. Pericardium: There is no evidence of pericardial effusion. Mitral Valve: The mitral valve is normal in structure. Mild mitral valve regurgitation. No evidence of mitral valve stenosis. Tricuspid Valve: The tricuspid valve is normal in structure. Tricuspid valve regurgitation is mild . No evidence of tricuspid stenosis. Aortic Valve: The aortic valve is normal in structure. Aortic valve regurgitation is not visualized. Mild aortic stenosis is present. Aortic valve mean gradient measures 5.5 mmHg. Aortic valve peak gradient measures 10.0 mmHg. Aortic valve area, by VTI measures 2.23 cm. Pulmonic Valve: The pulmonic valve was normal in structure. Pulmonic valve regurgitation is not visualized. No  evidence of pulmonic stenosis. Aorta: The aortic root is normal in size and structure. Venous: The inferior vena cava is normal in size with greater than 50% respiratory variability, suggesting right atrial pressure of 3 mmHg. IAS/Shunts: No atrial level shunt detected by color flow Doppler.  LEFT VENTRICLE PLAX 2D LVIDd:         5.40 cm      Diastology LVIDs:         3.80 cm      LV e' medial:    4.03 cm/s LV PW:  1.30 cm      LV E/e' medial:  21.6 LV IVS:        1.40 cm      LV e' lateral:   15.20 cm/s LVOT diam:     2.30 cm      LV E/e' lateral: 5.7 LV SV:         66 LV SV Index:   33 LVOT Area:     4.15 cm  LV Volumes (MOD) LV vol d, MOD A2C: 129.0 ml LV vol d, MOD A4C: 96.9 ml LV vol s, MOD A2C: 50.3 ml LV vol s, MOD A4C: 54.6 ml LV SV MOD A2C:     78.7 ml LV SV MOD A4C:     96.9 ml LV SV MOD BP:      61.0 ml IVC IVC diam: 2.50 cm LEFT ATRIUM             Index LA diam:        4.50 cm 2.20 cm/m LA Vol (A2C):   38.1 ml 18.63 ml/m LA Vol (A4C):   56.5 ml 27.63 ml/m LA Biplane Vol: 48.2 ml 23.57 ml/m  AORTIC VALVE                     PULMONIC VALVE AV Area (Vmax):    1.80 cm      PV Vmax:       0.99 m/s AV Area (Vmean):   1.71 cm      PV Peak grad:  3.9 mmHg AV Area (VTI):     2.23 cm AV Vmax:           158.50 cm/s AV Vmean:          108.000 cm/s AV VTI:            0.298 m AV Peak Grad:      10.0 mmHg AV Mean Grad:      5.5 mmHg LVOT Vmax:         68.70 cm/s LVOT Vmean:        44.400 cm/s LVOT VTI:          0.160 m LVOT/AV VTI ratio: 0.54  AORTA Ao Root diam: 3.50 cm Ao Asc diam:  3.40 cm MITRAL VALVE MV Area (PHT): 3.51 cm     SHUNTS MV Decel Time: 216 msec     Systemic VTI:  0.16 m MV E velocity: 87.00 cm/s   Systemic Diam: 2.30 cm MV A velocity: 108.00 cm/s MV E/A ratio:  0.81 Isaias Cowman MD Electronically signed by Isaias Cowman MD Signature Date/Time: 12/11/2022/11:20:02 AM    Final     Scheduled Meds:  aspirin EC  81 mg Oral Daily   atorvastatin  80 mg Oral Daily    clopidogrel  75 mg Oral Daily   enoxaparin (LOVENOX) injection  0.5 mg/kg Subcutaneous Q24H   fenofibrate  54 mg Oral Daily   insulin aspart  0-15 Units Subcutaneous TID WC   insulin aspart  0-5 Units Subcutaneous QHS   insulin glargine-yfgn  50 Units Subcutaneous Daily   levothyroxine  50 mcg Oral QAC breakfast   losartan  50 mg Oral Daily   pantoprazole  40 mg Oral Daily   potassium chloride  10 mEq Oral Daily   torsemide  20 mg Oral Daily    Continuous Infusions:   LOS: 3 days   Time spent: 31 minutes  Saafir Abdullah Marry Guan, MD [facility] Pager [phone]  If 7PM-7AM, please  contact night-coverage [email or web address] Password [email or web address] 12/12/2022, 9:20 AM

## 2022-12-12 NOTE — Progress Notes (Addendum)
Physical Therapy Treatment Patient Details Name: Joseph [name] MRN: [id number] DOB: 1925/10/21 Today's Date: 12/12/2022   History of Present Illness Pt is a 89 y/o male admitted secondary to R sided weakness. MRI of the brain revealed an acute L anterior choroidal artery infarct. PMH including but not limited to ypertension, hyperlipidemia, insulin-dependent type 2 diabetes, coronary artery disease status post CABG about 40 years ago.    PT Comments    Pt seen just after participating with OT and motivated to work more to regain function. Pt has made improvement with transfers and mobility since initial eval and may benefit more from Acute Inpatient Rehab once medically cleared. Pt is a young 89y.o. who is very motivated and was Independently functioning prior to most recent event. Will See pt in am to further assess function and mobility with most likely new recommendations for AIR upon d/c with increased frequency acutely.   Recommendations for follow up therapy are one component of a multi-disciplinary discharge planning process, led by the attending physician.  Recommendations may be updated based on patient status, additional functional criteria and insurance authorization.  Follow Up Recommendations  Acute inpatient rehab (3hours/day) Can patient physically be transported by private vehicle: Yes   Assistance Recommended at Discharge Frequent or constant Supervision/Assistance  Patient can return home with the following Two people to help with walking and/or transfers;Two people to help with bathing/dressing/bathroom;Assistance with cooking/housework;Assist for transportation;Help with stairs or ramp for entrance   Equipment Recommendations  Other (comment) (TBD)    Recommendations for Other Services       Precautions / Restrictions Precautions Precautions: Fall Restrictions Weight Bearing Restrictions: No     Mobility  Bed Mobility Overal bed mobility: Needs  Assistance Bed Mobility: Supine to Sit, Sit to Supine     Supine to sit: Min assist, HOB elevated (Use of side rail) Sit to supine: Min assist   General bed mobility comments: Able to scoot self up toward [facility] using bed rails with VC for technique    Transfers Overall transfer level: Needs assistance Equipment used: Rolling walker (2 wheels) Transfers: Sit to/from Stand Sit to Stand: Mod assist, From elevated surface                Ambulation/Gait Ambulation/Gait assistance: Mod assist Gait Distance (Feet): 3 Feet Assistive device: Rolling walker (2 wheels) Gait Pattern/deviations: Ataxic, Step-to pattern, Staggering right       General Gait Details: Decreased grip strength on Right, Right knee at risk for buckling, will progress with additional assist of 2 next visit   Stairs             Wheelchair Mobility    Modified Rankin (Stroke Patients Only)       Balance Overall balance assessment: Needs assistance Sitting-balance support: No upper extremity supported, Feet supported Sitting balance-Leahy Scale: Good     Standing balance support: During functional activity, Bilateral upper extremity supported, Reliant on assistive device for balance Standing balance-Leahy Scale: Fair (to Poor)                              Cognition Arousal/Alertness: Awake/alert Behavior During Therapy: WFL for tasks assessed/performed Overall Cognitive Status: Within Functional Limits for tasks assessed Area of Impairment: Safety/judgement, Problem solving                         Safety/Judgement: Decreased awareness of safety, Decreased awareness of deficits  Problem Solving: Difficulty sequencing, Requires verbal cues, Requires tactile cues General Comments: Very pleasant and motivated        Exercises General Exercises - Lower Extremity Ankle Circles/Pumps: AROM, Both, 10 reps Heel Slides: AAROM, AROM, Right, 5 reps Hip  ABduction/ADduction: AROM, AAROM, Right, 5 reps    General Comments General comments (skin integrity, edema, etc.):  (Pt and family educated on benefits of Acute Rehab vs SNF)      Pertinent Vitals/Pain Pain Assessment Pain Assessment: No/denies pain    Home Living                          Prior Function            PT Goals (current goals can now be found in the care plan section) Acute Rehab PT Goals Patient Stated Goal: to return to PLOF Progress towards PT goals: Progressing toward goals    Frequency    Min 2X/week      PT Plan Discharge plan needs to be updated    Co-evaluation              AM-PAC PT "6 Clicks" Mobility   Outcome Measure  Help needed turning from your back to your side while in a flat bed without using bedrails?: A Lot Help needed moving from lying on your back to sitting on the side of a flat bed without using bedrails?: A Little Help needed moving to and from a bed to a chair (including a wheelchair)?: A Lot Help needed standing up from a chair using your arms (e.g., wheelchair or bedside chair)?: A Lot Help needed to walk in hospital room?: Total Help needed climbing 3-5 steps with a railing? : Total 6 Click Score: 11    End of Session Equipment Utilized During Treatment: Gait belt Activity Tolerance: Patient tolerated treatment well Patient left: in bed;with call bell/phone within reach;with bed alarm set;with family/visitor present Nurse Communication: Mobility status PT Visit Diagnosis: Other abnormalities of gait and mobility (R26.89);Hemiplegia and hemiparesis Hemiplegia - Right/Left: Right Hemiplegia - dominant/non-dominant: Dominant Hemiplegia - caused by: Cerebral infarction     Time: 1630-1703 PT Time Calculation (min) (ACUTE ONLY): 33 min  Charges:  $Gait Training: 8-22 mins $Therapeutic Exercise: 8-22 mins                    Mikel Cella, PTA   LIBERO PUTHOFF 12/12/2022, 5:13 PM  Note addended for  typed recommendations/assessment to match clicked recommendations.   Rheta Hemmelgarn H. Owens Shark, PT, DPT, NCS 12/13/22, 8:34 AM [phone]

## 2022-12-12 NOTE — Progress Notes (Addendum)
Occupational Therapy Treatment Patient Details Name: Joseph [name] MRN: [id number] DOB: Aug 27, 1925 Today's Date: 12/12/2022   History of present illness Pt is a 89 y/o male admitted secondary to R sided weakness. MRI of the brain revealed an acute L anterior choroidal artery infarct. PMH including but not limited to ypertension, hyperlipidemia, insulin-dependent type 2 diabetes, coronary artery disease status post CABG about 40 years ago.   OT comments  Joseph [name] was seen for OT treatment on this date. Upon arrival to room pt reclined in bed having just returned from sitting in chair, agreeable to tx. Pt requires MIN A don L socks seated EOB, assist for threading over toes, MOD A don R sock. MIN cues + increased time open/close tooth paste with R hand. MIN A + RW sit<>stand x3, decreasing to MOD A + RW sit<>stand x2 as pt fatigues - use of ace wrap to facilitate R grip on RW. Completed ~3 ft steps forward/backward x3 and lateral L+R x2. Red theraputty provided and fine motor HEP discussed with pt/daughter. Daughter discussed plan to increase caregivers at home if needed (currently assist for cooking/cleaning only). Pt making good progress toward goals. Frequency updated and discharge recommendation update to AIR to reflect pt progress.    Recommendations for follow up therapy are one component of a multi-disciplinary discharge planning process, led by the attending physician.  Recommendations may be updated based on patient status, additional functional criteria and insurance authorization.    Follow Up Recommendations  Acute inpatient rehab (3hours/day)     Assistance Recommended at Discharge Frequent or constant Supervision/Assistance  Patient can return home with the following  Assistance with cooking/housework;Assist for transportation;Help with stairs or ramp for entrance;A lot of help with walking and/or transfers;A lot of help with bathing/dressing/bathroom   Equipment  Recommendations  BSC/3in1;Tub/shower bench    Recommendations for Other Services      Precautions / Restrictions Precautions Precautions: Fall Restrictions Weight Bearing Restrictions: No       Mobility Bed Mobility Overal bed mobility: Needs Assistance Bed Mobility: Supine to Sit, Sit to Supine     Supine to sit: Min assist Sit to supine: Min guard        Transfers Overall transfer level: Needs assistance Equipment used: Rolling walker (2 wheels) Transfers: Sit to/from Stand Sit to Stand: Min assist, Mod assist           General transfer comment: MIN A decreasing to MOD A as pt fatigued     Balance Overall balance assessment: Needs assistance Sitting-balance support: No upper extremity supported, Feet supported Sitting balance-Leahy Scale: Good     Standing balance support: Single extremity supported, During functional activity Standing balance-Leahy Scale: Fair                             ADL either performed or assessed with clinical judgement   ADL Overall ADL's : Needs assistance/impaired                                       General ADL Comments: MIN A don L socks seated EOB, assist for threading over toes, MOD A don R sock. SETUP self-drinking seated EOB, difficulty moderating grip. MIN cues + increased time open/close tooth paste with R hand.      Cognition Arousal/Alertness: Awake/alert Behavior During Therapy: WFL for tasks assessed/performed Overall Cognitive Status:  Within Functional Limits for tasks assessed                                           Pertinent Vitals/ Pain       Pain Assessment Pain Assessment: No/denies pain   Frequency  Min 4X/week        Progress Toward Goals  OT Goals(current goals can now be found in the care plan section)  Progress towards OT goals: Progressing toward goals  Acute Rehab OT Goals Patient Stated Goal: to go to rehab OT Goal Formulation: With  patient/family Time For Goal Achievement: 12/24/22 Potential to Achieve Goals: Fair ADL Goals Pt Will Perform Grooming: with set-up;sitting Pt Will Perform Lower Body Dressing: with min assist;sitting/lateral leans Pt Will Transfer to Toilet: with min assist;stand pivot transfer;bedside commode Pt Will Perform Toileting - Clothing Manipulation and hygiene: with min assist;sitting/lateral leans Pt/caregiver will Perform Home Exercise Program: Right Upper extremity;Independently  Plan Discharge plan needs to be updated;Frequency needs to be updated    Co-evaluation                 AM-PAC OT "6 Clicks" Daily Activity     Outcome Measure   Help from another person eating meals?: A Little Help from another person taking care of personal grooming?: A Little Help from another person toileting, which includes using toliet, bedpan, or urinal?: A Lot Help from another person bathing (including washing, rinsing, drying)?: A Lot Help from another person to put on and taking off regular upper body clothing?: A Little Help from another person to put on and taking off regular lower body clothing?: A Lot 6 Click Score: 15    End of Session    OT Visit Diagnosis: Other abnormalities of gait and mobility (R26.89);Hemiplegia and hemiparesis Hemiplegia - Right/Left: Right Hemiplegia - dominant/non-dominant: Dominant Hemiplegia - caused by: Cerebral infarction   Activity Tolerance Patient tolerated treatment well;Patient limited by fatigue   Patient Left in bed;with call bell/phone within reach;with family/visitor present   Nurse Communication          Time: 3612-2449 OT Time Calculation (min): 58 min  Charges: OT General Charges $OT Visit: 1 Visit OT Treatments $Self Care/Home Management : 38-52 mins $Therapeutic Exercise: 8-22 mins  Dessie Coma, M.S. OTR/L  12/12/22, 4:18 PM  ascom [phone]

## 2022-12-13 DIAGNOSIS — I639 Cerebral infarction, unspecified: Secondary | ICD-10-CM

## 2022-12-13 LAB — GLUCOSE, CAPILLARY
Glucose-Capillary: 186 mg/dL — ABNORMAL HIGH (ref 70–99)
Glucose-Capillary: 261 mg/dL — ABNORMAL HIGH (ref 70–99)
Glucose-Capillary: 244 mg/dL — ABNORMAL HIGH (ref 70–99)
Glucose-Capillary: 177 mg/dL — ABNORMAL HIGH (ref 70–99)

## 2022-12-13 LAB — BASIC METABOLIC PANEL
Anion gap: 8 (ref 5–15)
BUN: 17 mg/dL (ref 8–23)
CO2: 24 mmol/L (ref 22–32)
Calcium: 8.3 mg/dL — ABNORMAL LOW (ref 8.9–10.3)
Chloride: 107 mmol/L (ref 98–111)
Creatinine, Ser: 0.95 mg/dL (ref 0.61–1.24)
GFR, Estimated: >60 mL/min (ref >60)
Glucose, Bld: 175 mg/dL — ABNORMAL HIGH (ref 70–99)
Potassium: 3.5 mmol/L (ref 3.5–5.1)
Sodium: 139 mmol/L (ref 135–145)

## 2022-12-13 LAB — CBC
HCT: 36.5 % — ABNORMAL LOW (ref 39.0–52.0)
Hemoglobin: 12.3 g/dL — ABNORMAL LOW (ref 13.0–17.0)
MCH: 30.2 pg (ref 26.0–34.0)
MCHC: 33.7 g/dL (ref 30.0–36.0)
MCV: 89.7 fL (ref 80.0–100.0)
Platelets: 190 10*3/uL (ref 150–400)
RBC: 4.07 MIL/uL — ABNORMAL LOW (ref 4.22–5.81)
RDW: 13.1 % (ref 11.5–15.5)
WBC: 6.2 10*3/uL (ref 4.0–10.5)
nRBC: 0 % (ref 0.0–0.2)

## 2022-12-13 MED ORDER — INSULIN ASPART 100 UNIT/ML IJ SOLN
8.0000 [IU] | Freq: Three times a day (TID) | INTRAMUSCULAR | Status: DC
  Administered 2022-12-13 – 2022-12-14 (×3): 8 [IU] via SUBCUTANEOUS
  Filled 2022-12-13 (×2): qty 1

## 2022-12-13 NOTE — TOC Progression Note (Addendum)
Transition of Care [facility]) - Progression Note    Patient Details  Name: Joseph [name] MRN: [id number] Date of Birth: 26-Dec-1924  Transition of Care [facility]) CM/SW Contact  Gerilyn Pilgrim, LCSW Phone Number: 12/13/2022, 2:35 PM  Clinical Narrative:   SW spoke with daughter who states she has changed her mind and would like to go with [facility] for SNF. SW left VM with [facility] to let them know as well as health team to get auth switched over from [facility].  2:42pm Received call for HTA confirming that [facility] had been switched over to [facility].   Expected Discharge Plan: [place] Barriers to Discharge: Continued Medical Work up  Expected Discharge Plan and Services       Living arrangements for the past 2 months: Single Family Home                                       Social Determinants of Health (SDOH) Interventions SDOH Screenings   Food Insecurity: No Food Insecurity (12/09/2022)  Housing: Low Risk  (12/09/2022)  Transportation Needs: No Transportation Needs (12/09/2022)  Utilities: Not At Risk (12/09/2022)  Alcohol Screen: Low Risk  (03/02/2022)  Depression (PHQ2-9): Low Risk  (11/10/2022)  Financial Resource Strain: Low Risk  (03/02/2022)  Physical Activity: Insufficiently Active (03/02/2022)  Social Connections: Moderately Integrated (03/02/2022)  Stress: No Stress Concern Present (03/02/2022)  Tobacco Use: Medium Risk (12/09/2022)    Readmission Risk Interventions    12/10/2022    3:58 PM  Readmission Risk Prevention Plan  Transportation Screening Complete  PCP or Specialist Appt within 5-7 Days Complete  Home Care Screening Complete  Medication Review (RN CM) Complete

## 2022-12-13 NOTE — Progress Notes (Signed)
Physical Therapy Treatment Patient Details Name: Joseph [name] MRN: [id number] DOB: 17-Jun-1925 Today's Date: 12/13/2022   History of Present Illness Pt is a 89 y/o male admitted secondary to R sided weakness. MRI of the brain revealed an acute L anterior choroidal artery infarct. PMH including but not limited to ypertension, hyperlipidemia, insulin-dependent type 2 diabetes, coronary artery disease status post CABG about 40 years ago.    PT Comments    Pt received in bed, anxious to get moving. Pt completed multiple transfers from various surfaces with ModA with and without RW support. Pt requires vc's for technique, reinforced grip strength on right, and weight shifting to attain upright stance and reduce Right lateral lean. Significant progression with gait training this date with use of RW, right built up handle, ModA to maintain balance and cues for proper sequencing, additional assist to follow with w/c. Pt tolerated ~1 hour of functional activity without rest breaks, remains very motivated with supportive family and safe dispo post CIR stay. Pt remains an excellent candidate for acute inpatient rehab. Will continue to provide PT services acutely until cleared for d/c.    Recommendations for follow up therapy are one component of a multi-disciplinary discharge planning process, led by the attending physician.  Recommendations may be updated based on patient status, additional functional criteria and insurance authorization.  Follow Up Recommendations  Acute inpatient rehab (3hours/day) Can patient physically be transported by private vehicle: Yes   Assistance Recommended at Discharge Frequent or constant Supervision/Assistance  Patient can return home with the following Two people to help with walking and/or transfers;Two people to help with bathing/dressing/bathroom;Assistance with cooking/housework;Assist for transportation;Help with stairs or ramp for entrance   Equipment  Recommendations  Other (comment) (TBD at next facility)    Recommendations for Other Services       Precautions / Restrictions Precautions Precautions: Fall Restrictions Weight Bearing Restrictions: No     Mobility  Bed Mobility Overal bed mobility: Needs Assistance Bed Mobility: Supine to Sit     Supine to sit: Min assist, HOB elevated          Transfers Overall transfer level: Needs assistance Equipment used: Rolling walker (2 wheels) Transfers: Sit to/from Stand, Bed to chair/wheelchair/BSC Sit to Stand: Mod assist, From elevated surface Stand pivot transfers: Mod assist         General transfer comment: Verbal and tactile cues for hand placement and Right hand grip to support push off. Assist for hygiene on commode    Ambulation/Gait Ambulation/Gait assistance: Mod assist Gait Distance (Feet): 35 Feet Assistive device: Rolling walker (2 wheels) (Right built up grip handle) Gait Pattern/deviations: Ataxic, Step-to pattern, Narrow base of support (Right lateral lean, Right hip hike) Gait velocity: decreased     General Gait Details: Excellent progress with gait training. 61f x 2 ModA for balance during Right weight shift/stance time.   Stairs             Wheelchair Mobility    Modified Rankin (Stroke Patients Only)       Balance Overall balance assessment: Needs assistance Sitting-balance support: No upper extremity supported, Feet supported Sitting balance-Leahy Scale: Good     Standing balance support: During functional activity, Bilateral upper extremity supported, Reliant on assistive device for balance Standing balance-Leahy Scale: Fair (to poor)                              Cognition Arousal/Alertness: Awake/alert Behavior During Therapy:  WFL for tasks assessed/performed Overall Cognitive Status: Within Functional Limits for tasks assessed Area of Impairment: Safety/judgement, Problem solving                          Safety/Judgement: Decreased awareness of safety, Decreased awareness of deficits   Problem Solving: Difficulty sequencing, Requires verbal cues, Requires tactile cues General Comments: Very pleasant and motivated, slight Right neglect        Exercises General Exercises - Lower Extremity Ankle Circles/Pumps: AROM, Both, 10 reps Long Arc Quad: AROM, Both, 15 reps Hip ABduction/ADduction: AROM, Both, 10 reps, Seated    General Comments General comments (skin integrity, edema, etc.):  (Pt educated on safe transfer technique, gait training with RW, proper sequencing, and weight shifting. Discussed recommendations for pt to transition to AIR for continued rehab)      Pertinent Vitals/Pain Pain Assessment Pain Assessment: No/denies pain    Home Living                          Prior Function            PT Goals (current goals can now be found in the care plan section) Acute Rehab PT Goals Patient Stated Goal: to return to PLOF Progress towards PT goals: Progressing toward goals    Frequency    7X/week      PT Plan      Co-evaluation              AM-PAC PT "6 Clicks" Mobility   Outcome Measure  Help needed turning from your back to your side while in a flat bed without using bedrails?: A Lot Help needed moving from lying on your back to sitting on the side of a flat bed without using bedrails?: A Little Help needed moving to and from a bed to a chair (including a wheelchair)?: A Lot Help needed standing up from a chair using your arms (e.g., wheelchair or bedside chair)?: A Lot Help needed to walk in hospital room?: Total Help needed climbing 3-5 steps with a railing? : Total 6 Click Score: 11    End of Session Equipment Utilized During Treatment: Gait belt Activity Tolerance: Patient tolerated treatment well Patient left: in chair;with call bell/phone within reach;with chair alarm set;with family/visitor present Nurse Communication:  Mobility status PT Visit Diagnosis: Other abnormalities of gait and mobility (R26.89);Hemiplegia and hemiparesis Hemiplegia - Right/Left: Right Hemiplegia - dominant/non-dominant: Dominant Hemiplegia - caused by: Cerebral infarction     Time: 9379-0240 PT Time Calculation (min) (ACUTE ONLY): 55 min  Charges:  $Gait Training: 8-22 mins $Therapeutic Exercise: 8-22 mins $Therapeutic Activity: 8-22 mins $Neuromuscular Re-education: 8-22 mins                    Mikel Cella, PTA   BRENNAN LITZINGER 12/13/2022, 11:39 AM

## 2022-12-13 NOTE — Progress Notes (Signed)
Inpatient Rehab Admissions Coordinator Note:   Per updated therapy recommendations patient was screened for CIR candidacy by Michel Santee, PT. At this time, pt appears to be a potential candidate for CIR. I will place an order for rehab consult for full assessment, per our protocol.  Please contact me any with questions.Shann Medal, PT, DPT [phone] 12/13/22 9:49 AM

## 2022-12-13 NOTE — Progress Notes (Signed)
Occupational Therapy Treatment Patient Details Name: Joseph [name] MRN: [id number] DOB: 09-22-1925 Today's Date: 12/13/2022   History of present illness Pt is a 89 y/o male admitted secondary to R sided weakness. MRI of the brain revealed an acute L anterior choroidal artery infarct. PMH including but not limited to ypertension, hyperlipidemia, insulin-dependent type 2 diabetes, coronary artery disease status post CABG about 40 years ago.   OT comments  Mr Turpin was seen for OT treatment on this date. Upon arrival to room pt reclined in bed, agreeable to tx. Pt requires MIN A hand washing standing sink side, assist for standing balance. MOD A + RW sit<>stand x5 and ~58f mobility x3. Pt completed therex as described below. Tolerated standing functional reaching task wiping sink/mirror with wash cloth and placing soap bottles from one side of the counter to the other at various heights. Seated writing task including signing name with 50-75% legibility and completing clock drawing with 11/12 accuracy and 50% legibility. [facility] Pt making good progress toward goals, will continue to follow POC. Discharge recommendation remains appropriate.     Recommendations for follow up therapy are one component of a multi-disciplinary discharge planning process, led by the attending physician.  Recommendations may be updated based on patient status, additional functional criteria and insurance authorization.    Follow Up Recommendations  Acute inpatient rehab (3hours/day)     Assistance Recommended at Discharge Frequent or constant Supervision/Assistance  Patient can return home with the following  Assistance with cooking/housework;Assist for transportation;Help with stairs or ramp for entrance;A lot of help with walking and/or transfers;A lot of help with bathing/dressing/bathroom   Equipment Recommendations  BSC/3in1;Tub/shower bench    Recommendations for Other Services       Precautions / Restrictions Precautions Precautions: Fall Restrictions Weight Bearing Restrictions: No       Mobility Bed Mobility Overal bed mobility: Needs Assistance Bed Mobility: Supine to Sit, Sit to Supine     Supine to sit: Min guard Sit to supine: Min guard        Transfers Overall transfer level: Needs assistance Equipment used: Rolling walker (2 wheels) Transfers: Sit to/from Stand Sit to Stand: Mod assist           General transfer comment: assist to stabilize R grip on rail/RW and for lift off.     Balance Overall balance assessment: Needs assistance Sitting-balance support: No upper extremity supported, Feet supported Sitting balance-Leahy Scale: Good     Standing balance support: During functional activity, Single extremity supported Standing balance-Leahy Scale: Fair                             ADL either performed or assessed with clinical judgement   ADL Overall ADL's : Needs assistance/impaired                                       General ADL Comments: MIN A hand washing stnading sink side, assist for standing balance. MOD A + RW simulated BSC t/f      Cognition Arousal/Alertness: Awake/alert Behavior During Therapy: WFL for tasks assessed/performed Overall Cognitive Status: Within Functional Limits for tasks assessed Area of Impairment: Problem solving                             Problem  Solving: Difficulty sequencing, Requires verbal cues, Requires tactile cues General Comments: Very pleasant and motivated, slight Right neglect. Step by step cues to sequence steps        Exercises Exercises: Other exercises Other Exercises Other Exercises: Standing functional reaching task wiping sink/mirror with wash cloth and placing soap bottles from one side of the counter to the other at various heights. Other Exercises: Seated writing task including signing name with 50-75% legibility and completing  clock drawing with 11/12 accuracy and 50% legibility. Family eudcated on [facility] HEP Other Exercises: completed seated isolated finger tapping and red theraputty therex: 1 set x 10 reps each lateral pinch, rolling, key pinch, grip.            Pertinent Vitals/ Pain       Pain Assessment Pain Assessment: No/denies pain   Frequency  Min 4X/week        Progress Toward Goals  OT Goals(current goals can now be found in the care plan section)  Progress towards OT goals: Progressing toward goals  Acute Rehab OT Goals Patient Stated Goal: get stronger OT Goal Formulation: With patient/family Time For Goal Achievement: 12/24/22 Potential to Achieve Goals: Fair ADL Goals Pt Will Perform Grooming: with set-up;sitting Pt Will Perform Lower Body Dressing: with min assist;sitting/lateral leans Pt Will Transfer to Toilet: with min assist;stand pivot transfer;bedside commode Pt Will Perform Toileting - Clothing Manipulation and hygiene: with min assist;sitting/lateral leans Pt/caregiver will Perform Home Exercise Program: Right Upper extremity;Independently  Plan Discharge plan remains appropriate;Frequency remains appropriate    Co-evaluation                 AM-PAC OT "6 Clicks" Daily Activity     Outcome Measure   Help from another person eating meals?: A Little Help from another person taking care of personal grooming?: A Little Help from another person toileting, which includes using toliet, bedpan, or urinal?: A Lot Help from another person bathing (including washing, rinsing, drying)?: A Lot Help from another person to put on and taking off regular upper body clothing?: A Little Help from another person to put on and taking off regular lower body clothing?: A Lot 6 Click Score: 15    End of Session    OT Visit Diagnosis: Other abnormalities of gait and mobility (R26.89);Hemiplegia and hemiparesis Hemiplegia - Right/Left: Right Hemiplegia - dominant/non-dominant:  Dominant Hemiplegia - caused by: Cerebral infarction   Activity Tolerance Patient tolerated treatment well;Patient limited by fatigue   Patient Left in bed;with call bell/phone within reach;with family/visitor present   Nurse Communication          Time: 0370-4888 OT Time Calculation (min): 46 min  Charges: OT General Charges $OT Visit: 1 Visit OT Treatments $Therapeutic Activity: 8-22 mins $Neuromuscular Re-education: 8-22 mins $Therapeutic Exercise: 8-22 mins  Dessie Coma, M.S. OTR/L  12/13/22, 4:08 PM  ascom [phone]

## 2022-12-13 NOTE — Care Management Important Message (Signed)
Important Message  Patient Details  Name: Joseph [name] MRN: [id number] Date of Birth: 1925-02-20   Medicare Important Message Given:  Yes     Juliann Pulse A Sheng Pritz 12/13/2022, 12:00 PM

## 2022-12-13 NOTE — Inpatient Diabetes Management (Signed)
Inpatient Diabetes Program Recommendations  AACE/ADA: New Consensus Statement on Inpatient Glycemic Control (2015)  Target Ranges:  Prepandial:   less than 140 mg/dL      Peak postprandial:   less than 180 mg/dL (1-2 hours)      Critically ill patients:  140 - 180 mg/dL   Lab Results  Component Value Date   GLUCAP 261 (H) 12/13/2022   HGBA1C 7.4 (H) 12/09/2022    Latest Reference Range & Units 12/12/22 07:25 12/12/22 12:14 12/12/22 16:21 12/12/22 20:21 12/13/22 07:49 12/13/22 11:52  Glucose-Capillary 70 - 99 mg/dL 137 (H) 208 (H) 249 (H) 206 (H) 186 (H) 261 (H)  (H): Data is abnormally high  Diabetes history: DM2 Outpatient Diabetes medications: Lantus 56, Nov 14-34 MD, has libre 2  Current orders for Inpatient glycemic control: Semglee 50 units,Novolog 0-15 units tid, 0-5 units hs  Inpatient Diabetes Program Recommendations:   -Add Novolog 8 units tid if eats 50% or greater  Received consult regarding discussion of insulin with patient. Patient very versed with his diabetes and A1c is 7.4. Sees Dr. Gabriel Carina for endocrinology. Spoke with patient and daughter @ bedside, and explained range of blood sugars while in the hospital 140-180 to decrease chance of hypoglycemia when variables. When patient discharged to SNF, please consider: -Decrease Semglee to 40 (home insulin dose) -Continue Novolog meal coverage and will need to adjust accordingly @ SNF. -Decrease Novolog correction to 0-9 units tid, 0-5 units hs  Will follow while inpatient.  Thank you, Nani Gasser. Kaedon Fanelli, RN, MSN, CDE  Diabetes Coordinator Inpatient Glycemic Control Team Team Pager [phone] (8am-5pm) 12/13/2022 2:48 PM

## 2022-12-13 NOTE — Progress Notes (Signed)
Inpatient Rehab Admissions Coordinator:    I spoke with pt. And family regarding potential CIR admit. I discussed the fact that pt. Will have copays for CIR, that we will have to obtain insurance auth (and that approval is not guaranteed) and the fact that CIR has limited beds this week, so the insurance approval process/waiting for a bed could keep him in the hospital an additional 3-5+ days. Pt. And daughter both feel that while CIR may be good for him, they do not want to lose his place at [facility], the facility that  they originally preferred. After discussion and careful consideration, pt. Declined CIR. I have notified TOC.   Clemens Catholic, [place], [place] Admissions Coordinator  [phone] ([place]) [phone] (office)

## 2022-12-13 NOTE — Progress Notes (Signed)
PROGRESS NOTE  Joseph [name] DZH:[id number] DOB: 1925/05/31 DOA: 12/09/2022 PCP: [name], MD  Hospital Course/Subjective: Joseph [name] is a 89 y.o. male with medical history significant for hypertension, hyperlipidemia, insulin-dependent type 2 diabetes, coronary artery disease status post CABG about 40 years ago who is admitted to the hospitalist service for evaluation and management of acute left anterior choroidal artery stroke seen on MRI 1/19. He was started on DAPT (on Plavix at home), and had new right facial numbness and right hand weakness early 1/20.  Repeat head CT without significant change.  He was seen in consultation by neurology, he remained stable and therapy services are recommending rehab.  Seen and examined this morning, eating breakfast doing well.  His daughter and private caregiver are present this morning.  Therapy services are now recommending acute inpatient rehab, a recommendation that I agree with.  The patient is highly motivated and in my opinion has a high chance of significant recovery back to baseline.  Assessment/Plan: Acute cerebrovascular accident (CVA) ([place]) -acute left anterior choroidal artery stroke seen on MRI 1/19 -Inpatient admission, with acute CVA order set utilized -Continuous telemetry for 48 hours; since no events I would recommend arranging outpatient 30-day event monitor at discharge -Per neurology recommendations, okay to resume blood pressure control will resume his home losartan 100 mg p.o. daily (had started slow and was on 50 mg p.o. daily before today) -continue ASA x 21 days and Plavix  -2D echo unremarkable, CTA head and neck without LVO -Triglycerides not at goal, started fibrate, A1c noted above goal -Discussed with neurohospitalist Dr. Parke Simmers, appreciate her input   Hyperlipidemia -continue statin, LDL at goal but HDL low and TG 215, started fibrate Essential hypertension -resume losartan 100  daily Coronary artery disease -continue home Plavix, add aspirin as above for 21 days Type 2 diabetes mellitus with diabetic peripheral angiopathy without gangrene, with long-term current use of insulin (Joseph [name]) -Diabetic diet when eating -Hemoglobin A1c above goal -Continue home Lantus will increase basal insulin since sugars elevated-Sliding scale insulin   S/P CABG (coronary artery bypass graft)   Chronic combined systolic and diastolic CHF (congestive heart failure) (HCC) -no evidence of acute exacerbation, patient appears euvolemic on exam, will continue torsemide and potassium supplementation   Fall at home, initial encounter   Hypokalemia   DVT prophylaxis: Lovenox   Code Status: Full code, confirmed with patient and his son at the bedside of the time of hospital admission.   Family Communication: Discussed with patient this AM, with daughter and caregiver present.   Disposition Plan: Anticipate acute inpatient rehab at discharge, arrangements are pending   Consults: Neurohospitalist   Admission status: Inpatient  Antimicrobials: Anti-infectives (From admission, onward)    None      Objective: Vitals:   12/12/22 1619 12/12/22 2020 12/13/22 0540 12/13/22 0808  BP: (!) 167/54 (!) 156/71 (!) 151/61 (!) 171/62  Pulse: (!) 49 (!) 55 65 62  Resp:  '18 18 20  '$ Temp: 97.9 F (36.6 C) 99.1 F (37.3 C) 98.1 F (36.7 C) 98.1 F (36.7 C)  TempSrc:   Oral   SpO2: 99% 95% 95% 97%  Weight:      Height:        Intake/Output Summary (Last 24 hours) at 12/13/2022 0931 Last data filed at 12/13/2022 0808 Gross per 24 hour  Intake --  Output 1300 ml  Net -1300 ml    Filed Weights   12/09/22 0956 12/09/22 2040  Weight: 99.8 kg 93.1  kg   Exam: General:  Alert, oriented, calm, in no acute distress, daughter at bedside.  Patient is feeding himself some oranges. Eyes: EOMI, clear conjuctivae, white sclerea Neck: supple, no masses, trachea mildline  Cardiovascular: RRR, no murmurs  or rubs, no peripheral edema  Respiratory: clear to auscultation bilaterally, no wheezes, no crackles  Abdomen: soft, nontender, nondistended, normal bowel tones heard  Skin: dry, no rashes  Musculoskeletal: no joint effusions, normal range of motion  Psychiatric: appropriate affect, normal speech  Neurologic: extraocular muscles intact, clear speech, moving all extremities with intact sensorium  Data Reviewed: CBC: Recent Labs  Lab 12/09/22 1040 12/10/22 0605 12/11/22 0646 12/12/22 0527 12/13/22 0523  WBC 6.5 6.2 5.6 6.1 6.2  HGB 14.4 12.5* 12.5* 12.1* 12.3*  HCT 42.0 37.3* 36.0* 35.7* 36.5*  MCV 89.0 89.2 89.3 89.9 89.7  PLT 204 184 175 185 993    Basic Metabolic Panel: Recent Labs  Lab 12/09/22 1040 12/10/22 0605 12/11/22 0646 12/12/22 0527 12/13/22 0523  NA 138 139 138 140 139  K 3.3* 3.1* 3.3* 3.2* 3.5  CL 104 104 107 107 107  CO2 '24 26 24 25 24  '$ GLUCOSE 242* 192* 211* 136* 175*  BUN '16 15 14 15 17  '$ CREATININE 1.03 0.97 0.93 0.96 0.95  CALCIUM 8.8* 8.5* 8.4* 8.5* 8.3*  MG  --   --   --  1.7  --     GFR: Estimated Creatinine Clearance: 50.6 mL/min (by C-G formula based on SCr of 0.95 mg/dL). Liver Function Tests: No results for input(s): "AST", "ALT", "ALKPHOS", "BILITOT", "PROT", "ALBUMIN" in the last 168 hours. No results for input(s): "LIPASE", "AMYLASE" in the last 168 hours. No results for input(s): "AMMONIA" in the last 168 hours. Coagulation Profile: No results for input(s): "INR", "PROTIME" in the last 168 hours. Cardiac Enzymes: No results for input(s): "CKTOTAL", "CKMB", "CKMBINDEX", "TROPONINI" in the last 168 hours. BNP (last 3 results) No results for input(s): "PROBNP" in the last 8760 hours. HbA1C: No results for input(s): "HGBA1C" in the last 72 hours.  CBG: Recent Labs  Lab 12/12/22 0725 12/12/22 1214 12/12/22 1621 12/12/22 2021 12/13/22 0749  GLUCAP 137* 208* 249* 206* 186*    Lipid Profile: No results for input(s): "CHOL",  "HDL", "LDLCALC", "TRIG", "CHOLHDL", "LDLDIRECT" in the last 72 hours.  Thyroid Function Tests: No results for input(s): "TSH", "T4TOTAL", "FREET4", "T3FREE", "THYROIDAB" in the last 72 hours. Anemia Panel: No results for input(s): "VITAMINB12", "FOLATE", "FERRITIN", "TIBC", "IRON", "RETICCTPCT" in the last 72 hours. Urine analysis:    Component Value Date/Time   COLORURINE YELLOW (A) 12/10/2022 0115   APPEARANCEUR CLEAR (A) 12/10/2022 0115   LABSPEC 1.027 12/10/2022 0115   PHURINE 5.0 12/10/2022 0115   GLUCOSEU NEGATIVE 12/10/2022 0115   HGBUR NEGATIVE 12/10/2022 0115   BILIRUBINUR NEGATIVE 12/10/2022 0115   BILIRUBINUR negative 02/10/2020 1600   KETONESUR NEGATIVE 12/10/2022 0115   PROTEINUR NEGATIVE 12/10/2022 0115   UROBILINOGEN 0.2 02/10/2020 1600   NITRITE NEGATIVE 12/10/2022 0115   LEUKOCYTESUR NEGATIVE 12/10/2022 0115   Sepsis Labs: '@LABRCNTIP'$ (procalcitonin:4,lacticidven:4)  )No results found for this or any previous visit (from the past 240 hour(s)).   Studies: No results found.  Scheduled Meds:  aspirin EC  81 mg Oral Daily   atorvastatin  80 mg Oral Daily   clopidogrel  75 mg Oral Daily   enoxaparin (LOVENOX) injection  0.5 mg/kg Subcutaneous Q24H   fenofibrate  54 mg Oral Daily   insulin aspart  0-15 Units Subcutaneous TID WC  insulin aspart  0-5 Units Subcutaneous QHS   insulin glargine-yfgn  50 Units Subcutaneous Daily   levothyroxine  50 mcg Oral QAC breakfast   losartan  100 mg Oral Daily   pantoprazole  40 mg Oral Daily   potassium chloride  10 mEq Oral Daily   torsemide  20 mg Oral Daily    Continuous Infusions:   LOS: 4 days   Time spent: 31 minutes  Cristine Daw Marry Guan, MD [facility] Pager [phone]  If 7PM-7AM, please contact night-coverage [email or web address] Password [facility] 12/13/2022, 9:31 AM

## 2022-12-14 ENCOUNTER — Telehealth: Payer: Self-pay | Admitting: Student

## 2022-12-14 DIAGNOSIS — I4430 Unspecified atrioventricular block: Secondary | ICD-10-CM | POA: Diagnosis not present

## 2022-12-14 DIAGNOSIS — E876 Hypokalemia: Secondary | ICD-10-CM | POA: Diagnosis not present

## 2022-12-14 DIAGNOSIS — E785 Hyperlipidemia, unspecified: Secondary | ICD-10-CM | POA: Diagnosis not present

## 2022-12-14 DIAGNOSIS — I872 Venous insufficiency (chronic) (peripheral): Secondary | ICD-10-CM | POA: Diagnosis not present

## 2022-12-14 DIAGNOSIS — I639 Cerebral infarction, unspecified: Secondary | ICD-10-CM | POA: Diagnosis not present

## 2022-12-14 DIAGNOSIS — I214 Non-ST elevation (NSTEMI) myocardial infarction: Secondary | ICD-10-CM | POA: Diagnosis not present

## 2022-12-14 DIAGNOSIS — R040 Epistaxis: Secondary | ICD-10-CM | POA: Diagnosis not present

## 2022-12-14 DIAGNOSIS — I69398 Other sequelae of cerebral infarction: Secondary | ICD-10-CM | POA: Diagnosis not present

## 2022-12-14 DIAGNOSIS — Z951 Presence of aortocoronary bypass graft: Secondary | ICD-10-CM | POA: Diagnosis not present

## 2022-12-14 DIAGNOSIS — E1151 Type 2 diabetes mellitus with diabetic peripheral angiopathy without gangrene: Secondary | ICD-10-CM | POA: Diagnosis not present

## 2022-12-14 DIAGNOSIS — M79674 Pain in right toe(s): Secondary | ICD-10-CM | POA: Diagnosis not present

## 2022-12-14 DIAGNOSIS — M6281 Muscle weakness (generalized): Secondary | ICD-10-CM | POA: Diagnosis not present

## 2022-12-14 DIAGNOSIS — B351 Tinea unguium: Secondary | ICD-10-CM | POA: Diagnosis not present

## 2022-12-14 DIAGNOSIS — K219 Gastro-esophageal reflux disease without esophagitis: Secondary | ICD-10-CM | POA: Diagnosis not present

## 2022-12-14 DIAGNOSIS — Z9181 History of falling: Secondary | ICD-10-CM | POA: Diagnosis not present

## 2022-12-14 DIAGNOSIS — I5042 Chronic combined systolic (congestive) and diastolic (congestive) heart failure: Secondary | ICD-10-CM | POA: Diagnosis not present

## 2022-12-14 DIAGNOSIS — Z741 Need for assistance with personal care: Secondary | ICD-10-CM | POA: Diagnosis not present

## 2022-12-14 DIAGNOSIS — Z7401 Bed confinement status: Secondary | ICD-10-CM | POA: Diagnosis not present

## 2022-12-14 DIAGNOSIS — I2510 Atherosclerotic heart disease of native coronary artery without angina pectoris: Secondary | ICD-10-CM | POA: Diagnosis not present

## 2022-12-14 DIAGNOSIS — I152 Hypertension secondary to endocrine disorders: Secondary | ICD-10-CM | POA: Diagnosis not present

## 2022-12-14 DIAGNOSIS — W19XXXA Unspecified fall, initial encounter: Secondary | ICD-10-CM | POA: Diagnosis not present

## 2022-12-14 DIAGNOSIS — I110 Hypertensive heart disease with heart failure: Secondary | ICD-10-CM | POA: Diagnosis not present

## 2022-12-14 DIAGNOSIS — R278 Other lack of coordination: Secondary | ICD-10-CM | POA: Diagnosis not present

## 2022-12-14 DIAGNOSIS — E1159 Type 2 diabetes mellitus with other circulatory complications: Secondary | ICD-10-CM | POA: Diagnosis not present

## 2022-12-14 DIAGNOSIS — R531 Weakness: Secondary | ICD-10-CM | POA: Diagnosis not present

## 2022-12-14 DIAGNOSIS — I69351 Hemiplegia and hemiparesis following cerebral infarction affecting right dominant side: Secondary | ICD-10-CM | POA: Diagnosis not present

## 2022-12-14 DIAGNOSIS — I10 Essential (primary) hypertension: Secondary | ICD-10-CM | POA: Diagnosis not present

## 2022-12-14 DIAGNOSIS — M79675 Pain in left toe(s): Secondary | ICD-10-CM | POA: Diagnosis not present

## 2022-12-14 DIAGNOSIS — E039 Hypothyroidism, unspecified: Secondary | ICD-10-CM | POA: Diagnosis not present

## 2022-12-14 DIAGNOSIS — Z794 Long term (current) use of insulin: Secondary | ICD-10-CM | POA: Diagnosis not present

## 2022-12-14 DIAGNOSIS — R4189 Other symptoms and signs involving cognitive functions and awareness: Secondary | ICD-10-CM | POA: Diagnosis not present

## 2022-12-14 DIAGNOSIS — I25708 Atherosclerosis of coronary artery bypass graft(s), unspecified, with other forms of angina pectoris: Secondary | ICD-10-CM | POA: Diagnosis not present

## 2022-12-14 DIAGNOSIS — R2689 Other abnormalities of gait and mobility: Secondary | ICD-10-CM | POA: Diagnosis not present

## 2022-12-14 LAB — BASIC METABOLIC PANEL
Anion gap: 5 (ref 5–15)
BUN: 19 mg/dL (ref 8–23)
CO2: 25 mmol/L (ref 22–32)
Calcium: 8.5 mg/dL — ABNORMAL LOW (ref 8.9–10.3)
Chloride: 109 mmol/L (ref 98–111)
Creatinine, Ser: 1.08 mg/dL (ref 0.61–1.24)
GFR, Estimated: >60 mL/min (ref >60)
Glucose, Bld: 166 mg/dL — ABNORMAL HIGH (ref 70–99)
Potassium: 3.2 mmol/L — ABNORMAL LOW (ref 3.5–5.1)
Sodium: 139 mmol/L (ref 135–145)

## 2022-12-14 LAB — GLUCOSE, CAPILLARY
Glucose-Capillary: 165 mg/dL — ABNORMAL HIGH (ref 70–99)
Glucose-Capillary: 155 mg/dL — ABNORMAL HIGH (ref 70–99)
Glucose-Capillary: 201 mg/dL — ABNORMAL HIGH (ref 70–99)

## 2022-12-14 LAB — CBC
HCT: 37.3 % — ABNORMAL LOW (ref 39.0–52.0)
Hemoglobin: 12.7 g/dL — ABNORMAL LOW (ref 13.0–17.0)
MCH: 30.8 pg (ref 26.0–34.0)
MCHC: 34 g/dL (ref 30.0–36.0)
MCV: 90.5 fL (ref 80.0–100.0)
Platelets: 196 10*3/uL (ref 150–400)
RBC: 4.12 MIL/uL — ABNORMAL LOW (ref 4.22–5.81)
RDW: 13.1 % (ref 11.5–15.5)
WBC: 7.1 10*3/uL (ref 4.0–10.5)
nRBC: 0 % (ref 0.0–0.2)

## 2022-12-14 NOTE — Telephone Encounter (Signed)
Received call as

## 2022-12-14 NOTE — TOC Transition Note (Addendum)
Transition of Care [facility]) - CM/SW Discharge Note   Patient Details  Name: Joseph [name] MRN: [id number] Date of Birth: 1925/04/20  [facility]) CM/SW Contact:  Gerilyn Pilgrim, LCSW Phone Number: 12/14/2022, 2:02 PM   Clinical Narrative:   Pt has orders in to discharge to [facility]. Seth Bake at [facility] notified. RN given number for report. DC summary sent to facility, [facility] arranged for pick up around 3pm. Daughter vicky notified. CSW signing off.     Final next level of care: Skilled Nursing Facility Barriers to Discharge: Barriers Resolved   Patient Goals and CMS Choice CMS Medicare.gov Compare Post Acute Care list provided to:: Patient Choice offered to / list presented to : Patient  Discharge Placement                Patient chooses bed at: [facility] Patient to be transferred to facility by: [facility] Name of family member notified: Vicky Patient and family notified of of transfer: 12/14/22  Discharge Plan and Services Additional resources added to the After Visit Summary for                                       Social Determinants of Health (SDOH) Interventions SDOH Screenings   Food Insecurity: No Food Insecurity (12/09/2022)  Housing: Low Risk  (12/09/2022)  Transportation Needs: No Transportation Needs (12/09/2022)  Utilities: Not At Risk (12/09/2022)  Alcohol Screen: Low Risk  (03/02/2022)  Depression (PHQ2-9): Low Risk  (11/10/2022)  Financial Resource Strain: Low Risk  (03/02/2022)  Physical Activity: Insufficiently Active (03/02/2022)  Social Connections: Moderately Integrated (03/02/2022)  Stress: No Stress Concern Present (03/02/2022)  Tobacco Use: Medium Risk (12/09/2022)     Readmission Risk Interventions    12/10/2022    3:58 PM  Readmission Risk Prevention Plan  Transportation Screening Complete  PCP or Specialist Appt within 5-7 Days Complete  Home Care Screening Complete  Medication Review (RN CM) Complete

## 2022-12-14 NOTE — Progress Notes (Signed)
Mobility Specialist - Progress Note    12/14/22 1239  Mobility  Activity Transferred to/from [facility]  Level of Assistance Moderate assist, patient does 50-74%  Assistive Device Front wheel walker  Distance Ambulated (ft) 5 ft  Activity Response Tolerated well  Mobility Referral Yes  $Mobility charge 1 Mobility    Pt semi-supine on RA upon arrival. Pt STS and SPT to/from [facility] ModA. PT left supine in bed with needs in reach and family present.   Gretchen Short  Mobility Specialist  12/14/22 12:42 PM

## 2022-12-14 NOTE — Telephone Encounter (Signed)
Received call from RN at facility. Provided clarification for orders. 8 Units Aspart with meals with sliding scale from 120-399. If greater than 400 will plan to call on call provider. Continue Semglee 50 units daily. ACHS glucose checks with CGM.   Tomasa Rand, MD, [facility] [phone]

## 2022-12-14 NOTE — Discharge Summary (Signed)
Physician Discharge Summary   Patient: Joseph [name] MRN: [id number]  DOB: 05-Jan-1925   Admit:     Date of Admission: 12/09/2022 Admitted from: home   Discharge: Date of discharge: 12/14/22 Disposition: Skilled nursing facility Condition at discharge: good  CODE STATUS: FULL CODE      Discharge Physician: Emeterio Reeve, DO [facility]     PCP: [name], MD  Recommendations for Outpatient Follow-up:  Follow up with PCP Simmons-Robinson, Riki Sheer, MD in 1-2 weeks Follow up with Neurology in 4-6 weeks  Please obtain labs/tests: n/a Please follow up on the following pending results: n/a   Discharge Instructions     Diet - low sodium heart healthy   Complete by: As directed    Increase activity slowly   Complete by: As directed    No wound care   Complete by: As directed          Discharge Diagnoses: Principal Problem:   Acute cerebrovascular accident (CVA) ([place]) Active Problems:   Hyperlipidemia   Essential hypertension   Coronary artery disease   Type 2 diabetes mellitus with diabetic peripheral angiopathy without gangrene, with long-term current use of insulin ([place])   S/P CABG (coronary artery bypass graft)   Chronic combined systolic and diastolic CHF (congestive heart failure) ([place])   Fall at home, initial encounter   Hypokalemia       Hospital Course: Joseph [name] is a 89 y.o. male with medical history significant for hypertension, hyperlipidemia, insulin-dependent type 2 diabetes, coronary artery disease status post CABG about 40 years ago. To ED 12/09/2022 w/ weakness/fall. He was admitted to the hospitalist service for evaluation and management of acute left anterior choroidal artery stroke seen on MRI 1/19. He was started on DAPT (on Plavix at home), and had new right facial numbness and right hand weakness early 1/20.  Repeat head CT without significant change.  He was seen in consultation by neurology, he  remained stable and therapy services are recommending rehab however patient and family have opted for SNF rehab d/t slow process w/ insurance CIR approval.    Consultants:  Neurology   Procedures: none      ASSESSMENT & PLAN:   Principal Problem:   Acute cerebrovascular accident (CVA) ([place]) Active Problems:   Hyperlipidemia   Essential hypertension   Coronary artery disease   Type 2 diabetes mellitus with diabetic peripheral angiopathy without gangrene, with long-term current use of insulin ([place])   S/P CABG (coronary artery bypass graft)   Chronic combined systolic and diastolic CHF (congestive heart failure) ([place])   Fall at home, initial encounter   Hypokalemia  Acute cerebrovascular accident (CVA) ([place]) -acute left anterior choroidal artery stroke seen on MRI 1/19 Fall at home, initial encounter Per neurology recommendations, okay to resume blood pressure control will resume his home losartan 100 mg p.o. daily (had started slow and was on 50 mg p.o. daily before today) continue ASA x 21 days and Plavix indefinitely 2D echo unremarkable CTA head and neck without LVO Triglycerides not at goal, started fibrate A1c noted above goal   Hyperlipidemia  continue statin, LDL at goal but HDL low  TG 215, started fibrate  Essential hypertension  resume losartan 100 daily  Coronary artery disease   S/P CABG (coronary artery bypass graft) Chronic combined systolic and diastolic CHF (congestive heart failure) ([place]) continue home Plavix, add aspirin as above for 21 days continue torsemide and potassium supplementation  Type 2 diabetes mellitus with  diabetic peripheral angiopathy without gangrene, with long-term current use of insulin ([facility]) Diabetic diet when eating Hemoglobin A1c above goal Continue home Lantus will increase basal insulin since sugars elevated-Sliding scale insulin no evidence of acute exacerbation, patient appears euvolemic on exam,     Hypokalemia replaced           Discharge Instructions  Allergies as of 12/14/2022       Reactions   Farxiga [dapagliflozin] Rash   Genital rash        Medication List     STOP taking these medications    clobetasol cream 0.05 % Commonly known as: TEMOVATE   hydrocortisone 2.5 % cream   ketoconazole 2 % cream Commonly known as: NIZORAL   Lantus 100 UNIT/ML injection Generic drug: insulin glargine Replaced by: insulin glargine-yfgn 100 UNIT/ML injection   nystatin powder Commonly known as: MYCOSTATIN/NYSTOP   spironolactone 25 MG tablet Commonly known as: ALDACTONE       TAKE these medications    aspirin EC 81 MG tablet Take 1 tablet (81 mg total) by mouth daily for 21 days. Swallow whole.   atorvastatin 80 MG tablet Commonly known as: LIPITOR Take 1 tablet by mouth once daily   Cholecalciferol 125 MCG (5000 UT) capsule Take 5,000 Units by mouth at bedtime.   clopidogrel 75 MG tablet Commonly known as: PLAVIX Take 1 tablet by mouth once daily   fenofibrate 54 MG tablet Take 1 tablet (54 mg total) by mouth daily.   FreeStyle Libre 14 Day Sensor Misc USE AS DIRECTED EVERY 14 DAYS   FreeStyle Libre 2 Reader Devi Use 1 Device as directed   insulin aspart 100 UNIT/ML injection Commonly known as: novoLOG Take from 14 - 34 units before meals, as directed. Max daily dose is 90 units.   insulin glargine-yfgn 100 UNIT/ML injection Commonly known as: SEMGLEE Inject 0.5 mLs (50 Units total) into the skin daily. Replaces: Lantus 100 UNIT/ML injection   Klor-Con M10 10 MEQ tablet Generic drug: potassium chloride Take 10 mEq by mouth as needed. What changed: Another medication with the same name was added. Make sure you understand how and when to take each.   potassium chloride 10 MEQ tablet Commonly known as: Klor-Con M10 Take 2 tablets (20 mEq total) by mouth daily. What changed: You were already taking a medication with the same name, and  this prescription was added. Make sure you understand how and when to take each.   levothyroxine 50 MCG tablet Commonly known as: SYNTHROID TAKE 1 TABLET BY MOUTH ONCE DAILY BEFORE BREAKFAST   losartan 100 MG tablet Commonly known as: COZAAR Take 1 tablet by mouth once daily   Magnesium 250 MG Tabs Take 250 mg by mouth as needed (swelling). Take when you take your Torsemide.   nitroGLYCERIN 0.4 MG SL tablet Commonly known as: Nitrostat Place 1 tablet (0.4 mg total) under the tongue every 5 (five) minutes as needed for chest pain.   omeprazole 20 MG capsule Commonly known as: PRILOSEC Take 1 capsule by mouth once daily   torsemide 20 MG tablet Commonly known as: DEMADEX Take 1 tablet (20 mg total) by mouth daily.         Follow-up Information     Simmons-Robinson, Makiera, MD Follow up in 2 week(s).   Specialty: Family Medicine Contact information: [address] [place] [address] [facility] Follow up in 4 week(s).  Contact information: [place] [address] [phone]                Allergies  Allergen Reactions   [place] [Dapagliflozin] Rash    Genital rash     Subjective: Pt feeling well this morning, no concerns    Discharge Exam: BP (!) 139/53 (BP Location: Left Arm)   Pulse (!) 52   Temp 98.1 F (36.7 C)   Resp (!) 22   Ht '5\' 7"'$  (1.702 m)   Wt 93.1 kg   SpO2 95%   BMI 32.15 kg/m  General: Pt is alert, awake, not in acute distress Cardiovascular: RRR, S1/S2 +, no rubs, no gallops Respiratory: CTA bilaterally, no wheezing, no rhonchi Abdominal: Soft, NT, ND, bowel sounds + Extremities: no edema, no cyanosis     The results of significant diagnostics from this hospitalization (including imaging, microbiology, ancillary and laboratory) are listed below for reference.     Microbiology: No results found for this or any  previous visit (from the past 240 hour(s)).   Labs: BNP (last 3 results) No results for input(s): "BNP" in the last 8760 hours. Basic Metabolic Panel: Recent Labs  Lab 12/10/22 0605 12/11/22 0646 12/12/22 0527 12/13/22 0523 12/14/22 0439  NA 139 138 140 139 139  K 3.1* 3.3* 3.2* 3.5 3.2*  CL 104 107 107 107 109  CO2 '26 24 25 24 25  '$ GLUCOSE 192* 211* 136* 175* 166*  BUN '15 14 15 17 19  '$ CREATININE 0.97 0.93 0.96 0.95 1.08  CALCIUM 8.5* 8.4* 8.5* 8.3* 8.5*  MG  --   --  1.7  --   --     Liver Function Tests: No results for input(s): "AST", "ALT", "ALKPHOS", "BILITOT", "PROT", "ALBUMIN" in the last 168 hours. No results for input(s): "LIPASE", "AMYLASE" in the last 168 hours. No results for input(s): "AMMONIA" in the last 168 hours. CBC: Recent Labs  Lab 12/10/22 0605 12/11/22 0646 12/12/22 0527 12/13/22 0523 12/14/22 0439  WBC 6.2 5.6 6.1 6.2 7.1  HGB 12.5* 12.5* 12.1* 12.3* 12.7*  HCT 37.3* 36.0* 35.7* 36.5* 37.3*  MCV 89.2 89.3 89.9 89.7 90.5  PLT 184 175 185 190 196    Cardiac Enzymes: No results for input(s): "CKTOTAL", "CKMB", "CKMBINDEX", "TROPONINI" in the last 168 hours. BNP: Invalid input(s): "POCBNP" CBG: Recent Labs  Lab 12/13/22 1152 12/13/22 1600 12/13/22 2038 12/14/22 0722 12/14/22 1201  GLUCAP 261* 244* 177* 165* 155*    D-Dimer No results for input(s): "DDIMER" in the last 72 hours. Hgb A1c No results for input(s): "HGBA1C" in the last 72 hours. Lipid Profile No results for input(s): "CHOL", "HDL", "LDLCALC", "TRIG", "CHOLHDL", "LDLDIRECT" in the last 72 hours. Thyroid function studies No results for input(s): "TSH", "T4TOTAL", "T3FREE", "THYROIDAB" in the last 72 hours.  Invalid input(s): "FREET3" Anemia work up No results for input(s): "VITAMINB12", "FOLATE", "FERRITIN", "TIBC", "IRON", "RETICCTPCT" in the last 72 hours. Urinalysis    Component Value Date/Time   COLORURINE YELLOW (A) 12/10/2022 0115   APPEARANCEUR CLEAR (A)  12/10/2022 0115   LABSPEC 1.027 12/10/2022 0115   PHURINE 5.0 12/10/2022 0115   GLUCOSEU NEGATIVE 12/10/2022 0115   HGBUR NEGATIVE 12/10/2022 0115   BILIRUBINUR NEGATIVE 12/10/2022 0115   BILIRUBINUR negative 02/10/2020 1600   KETONESUR NEGATIVE 12/10/2022 0115   PROTEINUR NEGATIVE 12/10/2022 0115   UROBILINOGEN 0.2 02/10/2020 1600   NITRITE NEGATIVE 12/10/2022 0115   LEUKOCYTESUR NEGATIVE 12/10/2022 0115   Sepsis Labs Recent Labs  Lab  12/11/22 0646 12/12/22 0527 12/13/22 0523 12/14/22 0439  WBC 5.6 6.1 6.2 7.1    Microbiology No results found for this or any previous visit (from the past 240 hour(s)). Imaging ECHOCARDIOGRAM COMPLETE  Result Date: 12/11/2022    ECHOCARDIOGRAM REPORT   Patient Name:   Joseph [name] Date of Exam: 12/11/2022 Medical Rec #:  [id number]         Height:       67.0 in Accession #:    [id number]        Weight:       205.2 lb Date of Birth:  November 02, 1925        BSA:          2.045 m Patient Age:    89 years          BP:           143/66 mmHg Patient Gender: M                 HR:           100 bpm. Exam Location:  [facility] Procedure: 2D Echo, Color Doppler, Cardiac Doppler and Intracardiac            Opacification Agent Indications:     Stroke  History:         Patient has prior history of Echocardiogram examinations. DCHF,                  CAD; Risk Factors:Hypertension and Diabetes.  Sonographer:     Wallace Keller Thornton-Maynard Referring Phys:  [id number] [place] Diagnosing Phys: Isaias Cowman MD  Sonographer Comments: Suboptimal apical window. Image acquisition challenging due to patient body habitus. IMPRESSIONS  1. Left ventricular ejection fraction, by estimation, is 55 to 60%. The left ventricle has normal function. The left ventricle has no regional wall motion abnormalities. Left ventricular diastolic parameters are consistent with Grade I diastolic dysfunction (impaired relaxation).  2. Right ventricular systolic function is normal. The  right ventricular size is normal.  3. The mitral valve is normal in structure. Mild mitral valve regurgitation. No evidence of mitral stenosis.  4. The aortic valve is normal in structure. Aortic valve regurgitation is not visualized. Mild aortic valve stenosis.  5. The inferior vena cava is normal in size with greater than 50% respiratory variability, suggesting right atrial pressure of 3 mmHg. FINDINGS  Left Ventricle: Left ventricular ejection fraction, by estimation, is 55 to 60%. The left ventricle has normal function. The left ventricle has no regional wall motion abnormalities. Definity contrast agent was given IV to delineate the left ventricular  endocardial borders. The left ventricular internal cavity size was normal in size. There is no left ventricular hypertrophy. Left ventricular diastolic parameters are consistent with Grade I diastolic dysfunction (impaired relaxation). Right Ventricle: The right ventricular size is normal. No increase in right ventricular wall thickness. Right ventricular systolic function is normal. Left Atrium: Left atrial size was normal in size. Right Atrium: Right atrial size was normal in size. Pericardium: There is no evidence of pericardial effusion. Mitral Valve: The mitral valve is normal in structure. Mild mitral valve regurgitation. No evidence of mitral valve stenosis. Tricuspid Valve: The tricuspid valve is normal in structure. Tricuspid valve regurgitation is mild . No evidence of tricuspid stenosis. Aortic Valve: The aortic valve is normal in structure. Aortic valve regurgitation is not visualized. Mild aortic stenosis is present. Aortic valve mean gradient measures 5.5 mmHg. Aortic valve peak gradient measures 10.0 mmHg.  Aortic valve area, by VTI measures 2.23 cm. Pulmonic Valve: The pulmonic valve was normal in structure. Pulmonic valve regurgitation is not visualized. No evidence of pulmonic stenosis. Aorta: The aortic root is normal in size and structure.  Venous: The inferior vena cava is normal in size with greater than 50% respiratory variability, suggesting right atrial pressure of 3 mmHg. IAS/Shunts: No atrial level shunt detected by color flow Doppler.  LEFT VENTRICLE PLAX 2D LVIDd:         5.40 cm      Diastology LVIDs:         3.80 cm      LV e' medial:    4.03 cm/s LV PW:         1.30 cm      LV E/e' medial:  21.6 LV IVS:        1.40 cm      LV e' lateral:   15.20 cm/s LVOT diam:     2.30 cm      LV E/e' lateral: 5.7 LV SV:         66 LV SV Index:   33 LVOT Area:     4.15 cm  LV Volumes (MOD) LV vol d, MOD A2C: 129.0 ml LV vol d, MOD A4C: 96.9 ml LV vol s, MOD A2C: 50.3 ml LV vol s, MOD A4C: 54.6 ml LV SV MOD A2C:     78.7 ml LV SV MOD A4C:     96.9 ml LV SV MOD BP:      61.0 ml IVC IVC diam: 2.50 cm LEFT ATRIUM             Index LA diam:        4.50 cm 2.20 cm/m LA Vol (A2C):   38.1 ml 18.63 ml/m LA Vol (A4C):   56.5 ml 27.63 ml/m LA Biplane Vol: 48.2 ml 23.57 ml/m  AORTIC VALVE                     PULMONIC VALVE AV Area (Vmax):    1.80 cm      PV Vmax:       0.99 m/s AV Area (Vmean):   1.71 cm      PV Peak grad:  3.9 mmHg AV Area (VTI):     2.23 cm AV Vmax:           158.50 cm/s AV Vmean:          108.000 cm/s AV VTI:            0.298 m AV Peak Grad:      10.0 mmHg AV Mean Grad:      5.5 mmHg LVOT Vmax:         68.70 cm/s LVOT Vmean:        44.400 cm/s LVOT VTI:          0.160 m LVOT/AV VTI ratio: 0.54  AORTA Ao Root diam: 3.50 cm Ao Asc diam:  3.40 cm MITRAL VALVE MV Area (PHT): 3.51 cm     SHUNTS MV Decel Time: 216 msec     Systemic VTI:  0.16 m MV E velocity: 87.00 cm/s   Systemic Diam: 2.30 cm MV A velocity: 108.00 cm/s MV E/A ratio:  0.81 Isaias Cowman MD Electronically signed by Isaias Cowman MD Signature Date/Time: 12/11/2022/11:20:02 AM    Final       Time coordinating discharge: over 30 minutes  SIGNED:  Emeterio Reeve DO [facility]

## 2022-12-14 NOTE — Progress Notes (Signed)
Occupational Therapy Treatment Patient Details Name: Joseph [name] MRN: [id number] DOB: March 02, 1925 Today's Date: 12/14/2022   History of present illness Pt is a 89 y/o male admitted secondary to R sided weakness. MRI of the brain revealed an acute L anterior choroidal artery infarct. PMH including but not limited to hypertension, hyperlipidemia, insulin-dependent type 2 diabetes, coronary artery disease status post CABG about 40 years ago.   OT comments  Joseph [name] was seen for OT treatment on this date. Upon arrival to room pt seated in chair, agreeable to tx. Pt requires SETUP self-drinking, noted to have difficulty moderating grip. MIN A picking up coins from therapists hand and placing in resistive slot, assist to keep elbow against waist for proximal stability. Noted difficulty with coordination and isolated pinch strength to push into slot. MOD A + RW bed>BSC t/f. Completed ~30f mobility MOD A + RW (R wrist ace wrapped to RW), assist for balance, RW control, and cues to sequence steps/turns. Pt making excellent progress toward goals, will continue to follow POC. Discharge recommendation remains appropriate.     Recommendations for follow up therapy are one component of a multi-disciplinary discharge planning process, led by the attending physician.  Recommendations may be updated based on patient status, additional functional criteria and insurance authorization.    Follow Up Recommendations  Acute inpatient rehab (3hours/day)     Assistance Recommended at Discharge Frequent or constant Supervision/Assistance  Patient can return home with the following  Assistance with cooking/housework;Assist for transportation;Help with stairs or ramp for entrance;A lot of help with walking and/or transfers;A lot of help with bathing/dressing/bathroom   Equipment Recommendations  BSC/3in1;Tub/shower bench    Recommendations for Other Services      Precautions / Restrictions  Precautions Precautions: Fall Precaution Comments: R wrist ace wrap for mobility Restrictions Weight Bearing Restrictions: No       Mobility Bed Mobility Overal bed mobility: Needs Assistance Bed Mobility: Supine to Sit     Supine to sit: Min guard          Transfers Overall transfer level: Needs assistance Equipment used: Rolling walker (2 wheels) Transfers: Sit to/from Stand, Bed to chair/wheelchair/BSC Sit to Stand: Min assist     Step pivot transfers: Mod assist           Balance Overall balance assessment: Needs assistance Sitting-balance support: No upper extremity supported, Feet supported Sitting balance-Leahy Scale: Good     Standing balance support: Bilateral upper extremity supported, During functional activity Standing balance-Leahy Scale: Fair                             ADL either performed or assessed with clinical judgement   ADL Overall ADL's : Needs assistance/impaired                                       General ADL Comments: SETUP self-drinking, noted to have difficulty modersating grip. MIN A picking up coins from therapists hand and placing in resistive slot, assist to keep elbow against waist for proximal stability. MOD A + RW bed>BSC t/f.       Cognition Arousal/Alertness: Awake/alert Behavior During Therapy: WFL for tasks assessed/performed Overall Cognitive Status: Within Functional Limits for tasks assessed  Exercises Other Exercises Other Exercises: Completed ~21f mobility MOD A + RW (R wrist ace wrapped to RW), assist for balance, RW control, and cues to sequence            Pertinent Vitals/ Pain       Pain Assessment Pain Assessment: No/denies pain   Frequency  Min 4X/week        Progress Toward Goals  OT Goals(current goals can now be found in the care plan section)  Progress towards OT goals: Progressing toward  goals  Acute Rehab OT Goals Patient Stated Goal: to get stronger OT Goal Formulation: With patient/family Time For Goal Achievement: 12/24/22 Potential to Achieve Goals: Fair ADL Goals Pt Will Perform Grooming: with set-up;sitting Pt Will Perform Lower Body Dressing: with min assist;sitting/lateral leans Pt Will Transfer to Toilet: with min assist;stand pivot transfer;bedside commode Pt Will Perform Toileting - Clothing Manipulation and hygiene: with min assist;sitting/lateral leans Pt/caregiver will Perform Home Exercise Program: Right Upper extremity;Independently  Plan Discharge plan remains appropriate;Frequency remains appropriate    Co-evaluation                 AM-PAC OT "6 Clicks" Daily Activity     Outcome Measure   Help from another person eating meals?: A Little Help from another person taking care of personal grooming?: A Little Help from another person toileting, which includes using toliet, bedpan, or urinal?: A Lot Help from another person bathing (including washing, rinsing, drying)?: A Lot Help from another person to put on and taking off regular upper body clothing?: A Little Help from another person to put on and taking off regular lower body clothing?: A Lot 6 Click Score: 15    End of Session Equipment Utilized During Treatment: Gait belt;Rolling walker (2 wheels)  OT Visit Diagnosis: Other abnormalities of gait and mobility (R26.89);Hemiplegia and hemiparesis Hemiplegia - Right/Left: Right Hemiplegia - dominant/non-dominant: Dominant Hemiplegia - caused by: Cerebral infarction   Activity Tolerance Patient tolerated treatment well   Patient Left in chair;with call bell/phone within reach;with chair alarm set;with family/visitor present   Nurse Communication          Time: 08[address]22OT Time Calculation (min): 30 min  Charges: OT General Charges $OT Visit: 1 Visit OT Treatments $Self Care/Home Management : 8-22 mins $Therapeutic Activity:  8-22 mins  Joseph [name] M.S. OTR/L  12/14/22, 10:13 AM  ascom [phone]

## 2022-12-14 NOTE — Progress Notes (Signed)
Mobility Specialist - Progress Note   12/14/22 1025  Mobility  Activity Transferred to/from [facility]  Level of Assistance Moderate assist, patient does 50-74%  Assistive Device Front wheel walker  Distance Ambulated (ft) 5 ft  Activity Response Tolerated well  Mobility Referral Yes  $Mobility charge 1 Mobility   Pt sitting in recliner on RA requesting to go to [facility]. Pt STS and ambulates from recliner to/from [place] with no LOB noted. Pt left in recliner with needs in reach, chair alarm set, and family present.   Gretchen Short  Mobility Specialist  12/14/22 10:26 AM

## 2022-12-14 NOTE — Progress Notes (Signed)
Report given to Caren Griffins, RN at [facility]. EMS to bring patient. Patient assisted with getting dressed. PIV will be removed at discharge.

## 2022-12-14 NOTE — Progress Notes (Signed)
Mobility Specialist - Progress Note   12/14/22 0915  Mobility  Activity Transferred to/from [facility]  Level of Assistance Moderate assist, patient does 50-74%  Assistive Device Front wheel walker  Distance Ambulated (ft) 5 ft  Activity Response Tolerated well  Mobility Referral Yes  $Mobility charge 1 Mobility   Pt OOB on BSC on RA upon arrival. Pt STS and ambulates to bed and than from bed to recliner ModA. Pt has one LOB noted and corrected by author. Pt left in recliner with needs in reach, chair alarm set and OT entering room.   Gretchen Short  Mobility Specialist  12/14/22 9:17 AM

## 2022-12-15 ENCOUNTER — Non-Acute Institutional Stay (SKILLED_NURSING_FACILITY): Payer: PPO | Admitting: Adult Health

## 2022-12-15 ENCOUNTER — Encounter: Payer: Self-pay | Admitting: Adult Health

## 2022-12-15 DIAGNOSIS — I69351 Hemiplegia and hemiparesis following cerebral infarction affecting right dominant side: Secondary | ICD-10-CM

## 2022-12-15 DIAGNOSIS — I152 Hypertension secondary to endocrine disorders: Secondary | ICD-10-CM | POA: Diagnosis not present

## 2022-12-15 DIAGNOSIS — E039 Hypothyroidism, unspecified: Secondary | ICD-10-CM

## 2022-12-15 DIAGNOSIS — I5042 Chronic combined systolic (congestive) and diastolic (congestive) heart failure: Secondary | ICD-10-CM

## 2022-12-15 DIAGNOSIS — I25708 Atherosclerosis of coronary artery bypass graft(s), unspecified, with other forms of angina pectoris: Secondary | ICD-10-CM

## 2022-12-15 DIAGNOSIS — Z794 Long term (current) use of insulin: Secondary | ICD-10-CM

## 2022-12-15 DIAGNOSIS — E1151 Type 2 diabetes mellitus with diabetic peripheral angiopathy without gangrene: Secondary | ICD-10-CM | POA: Diagnosis not present

## 2022-12-15 DIAGNOSIS — E1159 Type 2 diabetes mellitus with other circulatory complications: Secondary | ICD-10-CM | POA: Diagnosis not present

## 2022-12-15 NOTE — Progress Notes (Signed)
Location:   [facility] Room Number: 103A Place of Service:  SNF (31) Provider:  Durenda Age, NP  Joseph Foster, MD  Patient Care Team: Joseph Foster, MD as PCP - General (Family Medicine) Minna Merritts, MD as PCP - Cardiology (Cardiology) Gabriel Carina Betsey Holiday, MD as Physician Assistant (Endocrinology) Marsh Dolly, MD as Referring Physician (Internal Medicine)  Extended Emergency Contact Information Primary Emergency Contact: Joseph [name] Phone: [phone] Relation: Daughter Secondary Emergency Contact: [place] [name] Phone: [phone] Relation: Son  Code Status:  FULL CODE Goals of care: Advanced Directive information    12/15/2022   10:46 AM  Advanced Directives  Does Patient Have a Medical Advance Directive? No  Does patient want to make changes to medical advance directive? No - Patient declined     Chief Complaint  Patient presents with   Screven [name] follow up    HPI:  Pt is a 89 y.o. male seen today for a [name] f/u s/p admission to [facility] SNF on 12/14/22 post hospitalization 12/09/22 to 1/24/24for acute CVA. He has a PMH of hypertension, hyperlipidemia, insulin-dependent type 2 diabetes mellitus and coronary artery disease S/P CABG about 40 years ago. He was sent to ED on 12/09/22 due to weakness/fall and was found to have acute left anterior choroidal artery stroke per MRI 1/19.  He was started on DAPT (on Plavix at home).  He had right facial numbness and right hand weakness on 1/20.  Repeat head CT without significant change.  Neurology was consulted during hospitalization.   He was seen in the  room today with daughter who was visiting. Daughter requested that patient be started on his insulin management pre-hospitalization and to have continuous glucose monitoring with Freestyle Libre 14.   Past Medical History:  Diagnosis Date   Acute  respiratory failure with hypoxia ([place]) 03/21/2016   CAD (coronary artery disease)    a. s/p CABG;  b. 07/2012 Cath: 3VD including 80 dLCX (med Rx), 4/4 patent grafts.   Chronic diastolic CHF (congestive heart failure) ([place])    a. 07/2012 Echo: EF 55-60%, no rwma, Gr 1 DD, mild MR;  04/2014 Echo: EF 55-60%, no rwma, mild MR, mildly dil LA.   Diabetes mellitus, type 2 ([place])    HTN (hypertension)    Hyperlipidemia    Hypothyroidism    Lower extremity edema    a. 03/2014 amlodipine d/c'd.   Spinal stenosis    Past Surgical History:  Procedure Laterality Date   APPENDECTOMY     CARDIAC CATHETERIZATION  07/2012   [facility]/no stents   CHOLECYSTECTOMY     CORNEAL TRANSPLANT     CORONARY ARTERY BYPASS GRAFT  1994   HEMORRHOID SURGERY     LEFT HEART CATH AND CORS/GRAFTS ANGIOGRAPHY N/A 12/17/2019   Procedure: LEFT HEART CATH AND CORS/GRAFTS ANGIOGRAPHY;  Surgeon: Nelva Bush, MD;  Location: [facility];  Service: Cardiovascular;  Laterality: N/A;   PROSTATE BIOPSY     TOE SURGERY     ingrown toe nail    Allergies  Allergen Reactions   Farxiga [Dapagliflozin] Rash    Genital rash    Allergies as of 12/15/2022       Reactions   Farxiga [dapagliflozin] Rash   Genital rash        Medication List        Accurate as of December 15, 2022 11:00 AM. If you have any questions, ask your nurse or doctor.  STOP taking these medications    FreeStyle Libre 14 Day Sensor Misc Stopped by: Durenda Age, NP   FreeStyle Libre 2 Reader [employer] by: Durenda Age, NP       TAKE these medications    aspirin EC 81 MG tablet Take 1 tablet (81 mg total) by mouth daily for 21 days. Swallow whole.   atorvastatin 80 MG tablet Commonly known as: LIPITOR Take 1 tablet by mouth once daily   Cholecalciferol 125 MCG (5000 UT) capsule Take 5,000 Units by mouth at bedtime.   clopidogrel 75 MG tablet Commonly known as: PLAVIX Take 1 tablet by mouth once  daily   Dermacloud Oint Apply topically. Apply to peri area topically every shift for skin protectant   fenofibrate 54 MG tablet Take 1 tablet (54 mg total) by mouth daily.   insulin aspart 100 UNIT/ML injection Commonly known as: novoLOG Aspart) Inject as per sliding scale: if 0 - 120 = 0; 121 -150 = 2; 151 - 200 = 3; 201 - 250 = 5; 251 - 300 = 8;301 - 350 = 11; 351 - 399 = 15; 400 - 1000 = CALL MD, subcutaneously before meals for dmii   insulin glargine-yfgn 100 UNIT/ML injection Commonly known as: SEMGLEE Inject 0.5 mLs (50 Units total) into the skin daily.   levothyroxine 50 MCG tablet Commonly known as: SYNTHROID TAKE 1 TABLET BY MOUTH ONCE DAILY BEFORE BREAKFAST   losartan 100 MG tablet Commonly known as: COZAAR Take 1 tablet by mouth once daily   Magnesium 250 MG Tabs Take 250 mg by mouth as needed (swelling). Take when you take your Torsemide.   nitroGLYCERIN 0.4 MG SL tablet Commonly known as: Nitrostat Place 1 tablet (0.4 mg total) under the tongue every 5 (five) minutes as needed for chest pain.   omeprazole 20 MG capsule Commonly known as: PRILOSEC Take 1 capsule by mouth once daily   potassium chloride 10 MEQ tablet Commonly known as: Klor-Con M10 Take 2 tablets (20 mEq total) by mouth daily.   torsemide 20 MG tablet Commonly known as: DEMADEX Take 1 tablet (20 mg total) by mouth daily.        Review of Systems  Constitutional:  Negative for activity change, appetite change and fever.  HENT:  Negative for sore throat.   Eyes: Negative.   Cardiovascular:  Negative for chest pain and leg swelling.  Gastrointestinal:  Negative for abdominal distention, diarrhea and vomiting.  Genitourinary:  Negative for dysuria, frequency and urgency.  Skin:  Negative for color change.  Neurological:  Positive for weakness. Negative for dizziness, speech difficulty and headaches.  Psychiatric/Behavioral:  Negative for behavioral problems and sleep disturbance. The  patient is not nervous/anxious.     Immunization History  Administered Date(s) Administered   Fluad Quad(high Dose 65+) 11/07/2019, 09/30/2021, 09/23/2022   Influenza Split 08/23/2011   Influenza, High Dose Seasonal PF 09/01/2015, 08/08/2017   Influenza,inj,Quad PF,6+ Mos 08/05/2013, 08/04/2014, 08/18/2016   PFIZER(Purple Top)SARS-COV-2 Vaccination 02/24/2020, 03/18/2020   Pneumococcal Conjugate-13 03/24/2015   Pneumococcal Polysaccharide-23 10/18/2014   Tdap 08/04/2014   Zoster, Live 08/04/2014   Pertinent  Health Maintenance Due  Topic Date Due   OPHTHALMOLOGY EXAM  11/17/2020   FOOT EXAM  09/22/2021   HEMOGLOBIN A1C  06/09/2023   INFLUENZA VACCINE  Completed      05/05/2020    1:47 PM 01/25/2021    1:39 PM 01/05/2022    2:12 PM 03/02/2022    3:45 PM 11/10/2022  8:25 AM  Fall Risk  Falls in the past year? 0 0 0 0 1  Was there an injury with Fall?  0  0 0  Fall Risk Category Calculator  0  0 1  Fall Risk Category (Retired)  Low  Low Low  (RETIRED) Patient Fall Risk Level Moderate fall risk   Low fall risk Low fall risk  Patient at Risk for Falls Due to Medication side effect   No Fall Risks No Fall Risks  Fall risk Follow up Falls prevention discussed   Falls evaluation completed    Functional Status Survey:    Vitals:   12/15/22 1043  BP: (!) 152/88  Pulse: 88  Resp: (!) 22  Temp: (!) 97.5 F (36.4 C)  SpO2: 98%  Weight: 204 lb 1.6 oz (92.6 kg)  Height: '5\' 7"'$  (1.702 m)   Body mass index is 31.97 kg/m. Physical Exam Constitutional:      Appearance: Normal appearance.  HENT:     Head: Normocephalic and atraumatic.     Mouth/Throat:     Mouth: Mucous membranes are moist.  Eyes:     Conjunctiva/sclera: Conjunctivae normal.  Cardiovascular:     Rate and Rhythm: Normal rate and regular rhythm.     Pulses: Normal pulses.     Heart sounds: Normal heart sounds.  Pulmonary:     Effort: Pulmonary effort is normal.     Breath sounds: Normal breath sounds.   Abdominal:     General: Bowel sounds are normal.     Palpations: Abdomen is soft.  Musculoskeletal:        General: No swelling.     Cervical back: Normal range of motion.  Skin:    General: Skin is warm and dry.  Neurological:     Mental Status: He is alert and oriented to person, place, and time.     Motor: Weakness present.     Comments: Right-sided weakness  Psychiatric:        Mood and Affect: Mood normal.        Behavior: Behavior normal.        Thought Content: Thought content normal.        Judgment: Judgment normal.     Labs reviewed: Recent Labs    04/28/22 1014 05/10/22 1552 06/01/22 1532 06/08/22 1338 12/12/22 0527 12/13/22 0523 12/14/22 0439  NA 139   < > 139   < > 140 139 139  K 4.0   < > 4.7   < > 3.2* 3.5 3.2*  CL 103   < > 100   < > 107 107 109  CO2 29   < > 24   < > '25 24 25  '$ GLUCOSE 214*   < > 191*   < > 136* 175* 166*  BUN 19   < > 20   < > '15 17 19  '$ CREATININE 1.04   < > 1.21   < > 0.96 0.95 1.08  CALCIUM 9.3   < > 9.3   < > 8.5* 8.3* 8.5*  MG 1.8  --   --   --  1.7  --   --   PHOS  --   --  3.4  --   --   --   --    < > = values in this interval not displayed.   Recent Labs    06/01/22 1532  ALBUMIN 3.9   Recent Labs    12/12/22 0527 12/13/22 0523 12/14/22 2202  WBC 6.1 6.2 7.1  HGB 12.1* 12.3* 12.7*  HCT 35.7* 36.5* 37.3*  MCV 89.9 89.7 90.5  PLT 185 190 196   Lab Results  Component Value Date   TSH 2.220 05/11/2020   Lab Results  Component Value Date   HGBA1C 7.4 (H) 12/09/2022   Lab Results  Component Value Date   CHOL 121 12/10/2022   HDL 26 (L) 12/10/2022   LDLCALC 52 12/10/2022   TRIG 215 (H) 12/10/2022   CHOLHDL 4.7 12/10/2022    Significant Diagnostic Results in last 30 days:  ECHOCARDIOGRAM COMPLETE  Result Date: 12/11/2022    ECHOCARDIOGRAM REPORT   Patient Name:   Joseph [name] Date of Exam: 12/11/2022 Medical Rec #:  [id number]         Height:       67.0 in Accession #:    [id number]        Weight:        205.2 lb Date of Birth:  Feb 05, 1925        BSA:          2.045 m Patient Age:    89 years          BP:           143/66 mmHg Patient Gender: M                 HR:           100 bpm. Exam Location:  [facility] Procedure: 2D Echo, Color Doppler, Cardiac Doppler and Intracardiac            Opacification Agent Indications:     Stroke  History:         Patient has prior history of Echocardiogram examinations. DCHF,                  CAD; Risk Factors:Hypertension and Diabetes.  Sonographer:     Wallace Keller Thornton-Maynard Referring Phys:  [id number] [place] Diagnosing Phys: Isaias Cowman MD  Sonographer Comments: Suboptimal apical window. Image acquisition challenging due to patient body habitus. IMPRESSIONS  1. Left ventricular ejection fraction, by estimation, is 55 to 60%. The left ventricle has normal function. The left ventricle has no regional wall motion abnormalities. Left ventricular diastolic parameters are consistent with Grade I diastolic dysfunction (impaired relaxation).  2. Right ventricular systolic function is normal. The right ventricular size is normal.  3. The mitral valve is normal in structure. Mild mitral valve regurgitation. No evidence of mitral stenosis.  4. The aortic valve is normal in structure. Aortic valve regurgitation is not visualized. Mild aortic valve stenosis.  5. The inferior vena cava is normal in size with greater than 50% respiratory variability, suggesting right atrial pressure of 3 mmHg. FINDINGS  Left Ventricle: Left ventricular ejection fraction, by estimation, is 55 to 60%. The left ventricle has normal function. The left ventricle has no regional wall motion abnormalities. Definity contrast agent was given IV to delineate the left ventricular  endocardial borders. The left ventricular internal cavity size was normal in size. There is no left ventricular hypertrophy. Left ventricular diastolic parameters are consistent with Grade I diastolic dysfunction  (impaired relaxation). Right Ventricle: The right ventricular size is normal. No increase in right ventricular wall thickness. Right ventricular systolic function is normal. Left Atrium: Left atrial size was normal in size. Right Atrium: Right atrial size was normal in size. Pericardium: There is no evidence of pericardial effusion. Mitral Valve: The mitral valve is normal in  structure. Mild mitral valve regurgitation. No evidence of mitral valve stenosis. Tricuspid Valve: The tricuspid valve is normal in structure. Tricuspid valve regurgitation is mild . No evidence of tricuspid stenosis. Aortic Valve: The aortic valve is normal in structure. Aortic valve regurgitation is not visualized. Mild aortic stenosis is present. Aortic valve mean gradient measures 5.5 mmHg. Aortic valve peak gradient measures 10.0 mmHg. Aortic valve area, by VTI measures 2.23 cm. Pulmonic Valve: The pulmonic valve was normal in structure. Pulmonic valve regurgitation is not visualized. No evidence of pulmonic stenosis. Aorta: The aortic root is normal in size and structure. Venous: The inferior vena cava is normal in size with greater than 50% respiratory variability, suggesting right atrial pressure of 3 mmHg. IAS/Shunts: No atrial level shunt detected by color flow Doppler.  LEFT VENTRICLE PLAX 2D LVIDd:         5.40 cm      Diastology LVIDs:         3.80 cm      LV e' medial:    4.03 cm/s LV PW:         1.30 cm      LV E/e' medial:  21.6 LV IVS:        1.40 cm      LV e' lateral:   15.20 cm/s LVOT diam:     2.30 cm      LV E/e' lateral: 5.7 LV SV:         66 LV SV Index:   33 LVOT Area:     4.15 cm  LV Volumes (MOD) LV vol d, MOD A2C: 129.0 ml LV vol d, MOD A4C: 96.9 ml LV vol s, MOD A2C: 50.3 ml LV vol s, MOD A4C: 54.6 ml LV SV MOD A2C:     78.7 ml LV SV MOD A4C:     96.9 ml LV SV MOD BP:      61.0 ml IVC IVC diam: 2.50 cm LEFT ATRIUM             Index LA diam:        4.50 cm 2.20 cm/m LA Vol (A2C):   38.1 ml 18.63 ml/m LA Vol  (A4C):   56.5 ml 27.63 ml/m LA Biplane Vol: 48.2 ml 23.57 ml/m  AORTIC VALVE                     PULMONIC VALVE AV Area (Vmax):    1.80 cm      PV Vmax:       0.99 m/s AV Area (Vmean):   1.71 cm      PV Peak grad:  3.9 mmHg AV Area (VTI):     2.23 cm AV Vmax:           158.50 cm/s AV Vmean:          108.000 cm/s AV VTI:            0.298 m AV Peak Grad:      10.0 mmHg AV Mean Grad:      5.5 mmHg LVOT Vmax:         68.70 cm/s LVOT Vmean:        44.400 cm/s LVOT VTI:          0.160 m LVOT/AV VTI ratio: 0.54  AORTA Ao Root diam: 3.50 cm Ao Asc diam:  3.40 cm MITRAL VALVE MV Area (PHT): 3.51 cm     SHUNTS MV Decel Time: 216 msec  Systemic VTI:  0.16 m MV E velocity: 87.00 cm/s   Systemic Diam: 2.30 cm MV A velocity: 108.00 cm/s MV E/A ratio:  0.81 Isaias Cowman MD Electronically signed by Isaias Cowman MD Signature Date/Time: 12/11/2022/11:20:02 AM    Final     Assessment/Plan  1. Hemiparesis affecting right side as late effect of cerebrovascular accident (CVA) ([place]) -  acute left anterior choroidal artery stroke per MRI 1/19 -  continue DAPT (ASA and Plavix) X 21 days then Plavix alone -  continue Atorvastatin -  for PT and OT, for therapeutic strengthening exercises -   follow up with neurology  2. Type 2 diabetes mellitus with diabetic peripheral angiopathy without gangrene, with long-term current use of insulin (H[facility]) Lab Results  Component Value Date   HGBA1C 7.4 (H) 12/09/2022   -  will change insulin per home regimen -  start Lantus 40 units daily, Novolog 35 units in the morning, 28 units at lunch and 18 units at dinner -  start continuous glucose monitoring with FreeStyle Libre 14  3. Hypertension associated with diabetes ([place]) -   continue Losartan -  monitor BPs  4. Acquired hypothyroidism Lab Results  Component Value Date   TSH 2.220 05/11/2020   -  continue Levothyroxine  5. Chronic combined systolic and diastolic CHF (congestive heart failure) (HCC) -  no  SOB, continue Torsemide  6. CAD S/P CABG -  continue DAPT (ASA and Plavix) X 21 days then Plavix alone -  continue Atorvastatin -  continue NTG PRN   Family/ staff Communication:  Discussed plan of care with resident, daughter Loletha Carrow) and Camera operator.  Labs/tests ordered:   None

## 2022-12-16 ENCOUNTER — Non-Acute Institutional Stay (SKILLED_NURSING_FACILITY): Payer: Self-pay | Admitting: Student

## 2022-12-16 ENCOUNTER — Encounter: Payer: Self-pay | Admitting: Student

## 2022-12-16 DIAGNOSIS — I10 Essential (primary) hypertension: Secondary | ICD-10-CM

## 2022-12-16 DIAGNOSIS — K219 Gastro-esophageal reflux disease without esophagitis: Secondary | ICD-10-CM

## 2022-12-16 DIAGNOSIS — E1159 Type 2 diabetes mellitus with other circulatory complications: Secondary | ICD-10-CM

## 2022-12-16 DIAGNOSIS — E1151 Type 2 diabetes mellitus with diabetic peripheral angiopathy without gangrene: Secondary | ICD-10-CM

## 2022-12-16 DIAGNOSIS — I152 Hypertension secondary to endocrine disorders: Secondary | ICD-10-CM

## 2022-12-16 DIAGNOSIS — I214 Non-ST elevation (NSTEMI) myocardial infarction: Secondary | ICD-10-CM

## 2022-12-16 DIAGNOSIS — I69351 Hemiplegia and hemiparesis following cerebral infarction affecting right dominant side: Secondary | ICD-10-CM

## 2022-12-16 DIAGNOSIS — E039 Hypothyroidism, unspecified: Secondary | ICD-10-CM

## 2022-12-16 DIAGNOSIS — I5042 Chronic combined systolic (congestive) and diastolic (congestive) heart failure: Secondary | ICD-10-CM

## 2022-12-16 DIAGNOSIS — Z794 Long term (current) use of insulin: Secondary | ICD-10-CM

## 2022-12-16 DIAGNOSIS — I441 Atrioventricular block, second degree: Secondary | ICD-10-CM

## 2022-12-16 NOTE — Progress Notes (Signed)
Provider:  Dr. Earnestine Mealing Location:  Other [place].  Nursing Home Room Number: [facility] Place of Service:  SNF (31)  PCP: Ronnald Ramp, MD Patient Care Team: Ronnald Ramp, MD as PCP - General (Family Medicine) Antonieta Iba, MD as PCP - Cardiology (Cardiology) Tedd Sias Marlana Salvage, MD as Physician Assistant (Endocrinology) Gertha Calkin, MD as Referring Physician (Internal Medicine)  Extended Emergency Contact Information Primary Emergency Contact: [facility] Phone: [phone] Relation: Daughter Secondary Emergency Contact: [facility] Phone: [phone] Relation: Son  Code Status: Full Code Goals of Care: Advanced Directive information    12/16/2022   10:18 AM  Advanced Directives  Does Patient Have a Medical Advance Directive? Yes  Type of Advance Directive Healthcare Power of Attorney  Does patient want to make changes to medical advance directive? No - Patient declined  Copy of Healthcare Power of Attorney in Chart? No - copy requested    Chief Complaint  Patient presents with   New Admit To SNF    Admission     HPI: Patient is a 89 y.o. male seen today for admission to [facility].   At home he was totally independent. He drives. He doesn't clean the house, but he can do everything he wants to do. He stopped doing yard work. This started about 2.5 years ago. He has an aid at bedside  He manages medications. He dresses and showers independently. He lives here in [place].   On 1/18 he noticed that his arm wasn't coorperating well, but it didn't feel right. He went to the hospital on 1/19. He couldn't get his right leg to move and he fell flat on the floor. His face went into his shoes which saved his face from injury. His Caregiver Cherly Hensen was about to come so he sat up and they called 911. He wen tto the hospital and had MRI and CT scan. He can't grip like he used to nor his right leg so he doesn't fall  over. This is why he is in therapy. Right side of the face. He states he had stopped taking his plavix.   He denies bleeding in gums, urine or stool. NO issues with urination. Has a lot of straining with Bms.   DM he has had DM for a long time.  He takes 40 units in the morning before breakfast and 35 units of novolog all at the same time. No additioanl lantus during they, but before each meal he takes 28 and at supper he takes 18.   Past Medical History:  Diagnosis Date   Acute respiratory failure with hypoxia (HCC) 03/21/2016   CAD (coronary artery disease)    a. s/p CABG;  b. 07/2012 Cath: 3VD including 80 dLCX (med Rx), 4/4 patent grafts.   Chronic diastolic CHF (congestive heart failure) (HCC)    a. 07/2012 Echo: EF 55-60%, no rwma, Gr 1 DD, mild MR;  04/2014 Echo: EF 55-60%, no rwma, mild MR, mildly dil LA.   Diabetes mellitus, type 2 (HCC)    HTN (hypertension)    Hyperlipidemia    Hypothyroidism    Lower extremity edema    a. 03/2014 amlodipine d/c'd.   Spinal stenosis    Past Surgical History:  Procedure Laterality Date   APPENDECTOMY     CARDIAC CATHETERIZATION  07/2012   [facility]/no stents   CHOLECYSTECTOMY     CORNEAL TRANSPLANT     CORONARY ARTERY BYPASS GRAFT  1994   HEMORRHOID SURGERY  LEFT HEART CATH AND CORS/GRAFTS ANGIOGRAPHY N/A 12/17/2019   Procedure: LEFT HEART CATH AND CORS/GRAFTS ANGIOGRAPHY;  Surgeon: Yvonne Kendall, MD;  Location: [facility] INVASIVE CV LAB;  Service: Cardiovascular;  Laterality: N/A;   PROSTATE BIOPSY     TOE SURGERY     ingrown toe nail    reports that he quit smoking about 42 years ago. His smoking use included pipe, cigars, and cigarettes. He has never used smokeless tobacco. He reports that he does not drink alcohol and does not use drugs. Social History   Socioeconomic History   Marital status: Widowed    Spouse name: Not on file   Number of children: 3   Years of education: Not on file   Highest education level: Some college, no degree   Occupational History   Occupation: retired  Tobacco Use   Smoking status: Former    Packs/day: 0.00    Years: 6.00    Total pack years: 0.00    Types: Pipe, Cigars, Cigarettes    Quit date: 12/23/1980    Years since quitting: 42.0   Smokeless tobacco: Never  Vaping Use   Vaping Use: Never used  Substance and Sexual Activity   Alcohol use: No   Drug use: No   Sexual activity: Never  Other Topics Concern   Not on file  Social History Narrative   Retired.    Social Determinants of Health   Financial Resource Strain: Low Risk  (03/02/2022)   Overall Financial Resource Strain (CARDIA)    Difficulty of Paying Living Expenses: Not hard at all  Food Insecurity: No Food Insecurity (12/09/2022)   Hunger Vital Sign    Worried About Running Out of Food in the Last Year: Never true    Ran Out of Food in the Last Year: Never true  Transportation Needs: No Transportation Needs (12/09/2022)   PRAPARE - Administrator, Civil Service (Medical): No    Lack of Transportation (Non-Medical): No  Physical Activity: Insufficiently Active (03/02/2022)   Exercise Vital Sign    Days of Exercise per Week: 2 days    Minutes of Exercise per Session: 20 min  Stress: No Stress Concern Present (03/02/2022)   [employer] of Occupational Health - Occupational Stress Questionnaire    Feeling of Stress : Not at all  Social Connections: Moderately Integrated (03/02/2022)   Social Connection and Isolation Panel [NHANES]    Frequency of Communication with Friends and Family: More than three times a week    Frequency of Social Gatherings with Friends and Family: Once a week    Attends Religious Services: More than 4 times per year    Active Member of [employer] or Organizations: No    Attends Engineer, structural: More than 4 times per year    Marital Status: Widowed  Intimate Partner Violence: Not At Risk (12/09/2022)   Humiliation, Afraid, Rape, and Kick questionnaire    Fear of Current or  Ex-Partner: No    Emotionally Abused: No    Physically Abused: No    Sexually Abused: No    Functional Status Survey:    Family History  Problem Relation Age of Onset   Heart failure Mother    Heart attack Father    Stroke Brother    Heart failure Maternal Uncle    Heart failure Maternal Grandfather    Sleep apnea Daughter    Sleep apnea Son    Kidney Stones Son     Health Maintenance  Topic  Date Due   Zoster Vaccines- Shingrix (1 of 2) Never done   COVID-19 Vaccine (3 - Pfizer risk series) 04/15/2020   OPHTHALMOLOGY EXAM  11/17/2020   FOOT EXAM  09/22/2021   Medicare Annual Wellness (AWV)  03/03/2023   HEMOGLOBIN A1C  06/09/2023   DTaP/Tdap/Td (2 - Td or Tdap) 08/04/2024   Pneumonia Vaccine 29+ Years old  Completed   INFLUENZA VACCINE  Completed   HPV VACCINES  Aged Out    Allergies  Allergen Reactions   Farxiga [Dapagliflozin] Rash    Genital rash    Outpatient Encounter Medications as of 12/16/2022  Medication Sig   aspirin EC 81 MG tablet Take 1 tablet (81 mg total) by mouth daily for 21 days. Swallow whole.   atorvastatin (LIPITOR) 80 MG tablet Take 1 tablet by mouth once daily   Cholecalciferol 125 MCG (5000 UT) capsule Take 5,000 Units by mouth at bedtime.   clopidogrel (PLAVIX) 75 MG tablet Take 1 tablet by mouth once daily   fenofibrate 54 MG tablet Take 1 tablet (54 mg total) by mouth daily.   Infant Care Products [facility]) OINT Apply topically. Apply to peri area topically every shift for skin protectant   insulin aspart (NOVOLOG) 100 UNIT/ML injection Inject 18 units subcutaneously in the evening, Inject 28 units subcutaneously once daily, Inject 35units subcutaneously one time daily.   insulin glargine (LANTUS) 100 UNIT/ML injection Inject 40 Units into the skin daily.   levothyroxine (SYNTHROID) 50 MCG tablet TAKE 1 TABLET BY MOUTH ONCE DAILY BEFORE BREAKFAST   losartan (COZAAR) 100 MG tablet Take 1 tablet by mouth once daily   Magnesium 250 MG  TABS Take 250 mg by mouth as needed (swelling). Take when you take your Torsemide.   nitroGLYCERIN (NITROSTAT) 0.4 MG SL tablet Place 1 tablet (0.4 mg total) under the tongue every 5 (five) minutes as needed for chest pain.   omeprazole (PRILOSEC) 20 MG capsule Take 1 capsule by mouth once daily   potassium chloride (KLOR-CON M10) 10 MEQ tablet Take 2 tablets (20 mEq total) by mouth daily.   torsemide (DEMADEX) 20 MG tablet Take 1 tablet (20 mg total) by mouth daily.   [DISCONTINUED] insulin aspart (NOVOLOG) 100 UNIT/ML injection Aspart) Inject as per sliding scale: if 0 - 120 = 0; 121 -150 = 2; 151 - 200 = 3; 201 - 250 = 5; 251 - 300 = 8;301 - 350 = 11; 351 - 399 = 15; 400 - 1000 = CALL MD, subcutaneously before meals for dmii   [DISCONTINUED] insulin glargine-yfgn (SEMGLEE) 100 UNIT/ML injection Inject 0.5 mLs (50 Units total) into the skin daily.   No facility-administered encounter medications on file as of 12/16/2022.    Review of Systems  Vitals:   12/16/22 1009  BP: 130/82  Pulse: 98  Resp: 20  Temp: (!) 96.4 F (35.8 C)  SpO2: 95%  Weight: 205 lb 12.8 oz (93.4 kg)  Height: 5\' 7"  (1.702 m)   Body mass index is 32.23 kg/m. Physical Exam  Labs reviewed: Basic Metabolic Panel: Recent Labs    04/28/22 1014 05/10/22 1552 06/01/22 1532 06/08/22 1338 12/12/22 0527 12/13/22 0523 12/14/22 0439  NA 139   < > 139   < > 140 139 139  K 4.0   < > 4.7   < > 3.2* 3.5 3.2*  CL 103   < > 100   < > 107 107 109  CO2 29   < > 24   < >  25 24 25   GLUCOSE 214*   < > 191*   < > 136* 175* 166*  BUN 19   < > 20   < > 15 17 19   CREATININE 1.04   < > 1.21   < > 0.96 0.95 1.08  CALCIUM 9.3   < > 9.3   < > 8.5* 8.3* 8.5*  MG 1.8  --   --   --  1.7  --   --   PHOS  --   --  3.4  --   --   --   --    < > = values in this interval not displayed.   Liver Function Tests: Recent Labs    06/01/22 1532  ALBUMIN 3.9   No results for input(s): "LIPASE", "AMYLASE" in the last 8760 hours. No  results for input(s): "AMMONIA" in the last 8760 hours. CBC: Recent Labs    12/12/22 0527 12/13/22 0523 12/14/22 0439  WBC 6.1 6.2 7.1  HGB 12.1* 12.3* 12.7*  HCT 35.7* 36.5* 37.3*  MCV 89.9 89.7 90.5  PLT 185 190 196   Cardiac Enzymes: No results for input(s): "CKTOTAL", "CKMB", "CKMBINDEX", "TROPONINI" in the last 8760 hours. BNP: Invalid input(s): "POCBNP" Lab Results  Component Value Date   HGBA1C 7.4 (H) 12/09/2022   Lab Results  Component Value Date   TSH 2.220 05/11/2020   No results found for: "VITAMINB12" No results found for: "FOLATE" No results found for: "IRON", "TIBC", "FERRITIN"  Imaging and Procedures obtained prior to SNF admission: ECHOCARDIOGRAM COMPLETE  Result Date: 12/11/2022    ECHOCARDIOGRAM REPORT   Patient Name:   DEEGAN MABEN Date of Exam: 12/11/2022 Medical Rec #:  [id number]         Height:       67.0 in Accession #:    [id number]        Weight:       205.2 lb Date of Birth:  12-19-1924        BSA:          2.045 m Patient Age:    89 years          BP:           143/66 mmHg Patient Gender: M                 HR:           100 bpm. Exam Location:  [facility] Procedure: 2D Echo, Color Doppler, Cardiac Doppler and Intracardiac            Opacification Agent Indications:     Stroke  History:         Patient has prior history of Echocardiogram examinations. DCHF,                  CAD; Risk Factors:Hypertension and Diabetes.  Sonographer:     Hilbert Corrigan Thornton-Maynard Referring Phys:  [id number] MIR MOHAMMED [facility] Diagnosing Phys: Marcina Millard MD  Sonographer Comments: Suboptimal apical window. Image acquisition challenging due to patient body habitus. IMPRESSIONS  1. Left ventricular ejection fraction, by estimation, is 55 to 60%. The left ventricle has normal function. The left ventricle has no regional wall motion abnormalities. Left ventricular diastolic parameters are consistent with Grade I diastolic dysfunction (impaired relaxation).  2. Right  ventricular systolic function is normal. The right ventricular size is normal.  3. The mitral valve is normal in structure. Mild mitral valve regurgitation. No evidence of mitral stenosis.  4. The  aortic valve is normal in structure. Aortic valve regurgitation is not visualized. Mild aortic valve stenosis.  5. The inferior vena cava is normal in size with greater than 50% respiratory variability, suggesting right atrial pressure of 3 mmHg. FINDINGS  Left Ventricle: Left ventricular ejection fraction, by estimation, is 55 to 60%. The left ventricle has normal function. The left ventricle has no regional wall motion abnormalities. Definity contrast agent was given IV to delineate the left ventricular  endocardial borders. The left ventricular internal cavity size was normal in size. There is no left ventricular hypertrophy. Left ventricular diastolic parameters are consistent with Grade I diastolic dysfunction (impaired relaxation). Right Ventricle: The right ventricular size is normal. No increase in right ventricular wall thickness. Right ventricular systolic function is normal. Left Atrium: Left atrial size was normal in size. Right Atrium: Right atrial size was normal in size. Pericardium: There is no evidence of pericardial effusion. Mitral Valve: The mitral valve is normal in structure. Mild mitral valve regurgitation. No evidence of mitral valve stenosis. Tricuspid Valve: The tricuspid valve is normal in structure. Tricuspid valve regurgitation is mild . No evidence of tricuspid stenosis. Aortic Valve: The aortic valve is normal in structure. Aortic valve regurgitation is not visualized. Mild aortic stenosis is present. Aortic valve mean gradient measures 5.5 mmHg. Aortic valve peak gradient measures 10.0 mmHg. Aortic valve area, by VTI measures 2.23 cm. Pulmonic Valve: The pulmonic valve was normal in structure. Pulmonic valve regurgitation is not visualized. No evidence of pulmonic stenosis. Aorta: The  aortic root is normal in size and structure. Venous: The inferior vena cava is normal in size with greater than 50% respiratory variability, suggesting right atrial pressure of 3 mmHg. IAS/Shunts: No atrial level shunt detected by color flow Doppler.  LEFT VENTRICLE PLAX 2D LVIDd:         5.40 cm      Diastology LVIDs:         3.80 cm      LV e' medial:    4.03 cm/s LV PW:         1.30 cm      LV E/e' medial:  21.6 LV IVS:        1.40 cm      LV e' lateral:   15.20 cm/s LVOT diam:     2.30 cm      LV E/e' lateral: 5.7 LV SV:         66 LV SV Index:   33 LVOT Area:     4.15 cm  LV Volumes (MOD) LV vol d, MOD A2C: 129.0 ml LV vol d, MOD A4C: 96.9 ml LV vol s, MOD A2C: 50.3 ml LV vol s, MOD A4C: 54.6 ml LV SV MOD A2C:     78.7 ml LV SV MOD A4C:     96.9 ml LV SV MOD BP:      61.0 ml IVC IVC diam: 2.50 cm LEFT ATRIUM             Index LA diam:        4.50 cm 2.20 cm/m LA Vol (A2C):   38.1 ml 18.63 ml/m LA Vol (A4C):   56.5 ml 27.63 ml/m LA Biplane Vol: 48.2 ml 23.57 ml/m  AORTIC VALVE                     PULMONIC VALVE AV Area (Vmax):    1.80 cm      PV Vmax:  0.99 m/s AV Area (Vmean):   1.71 cm      PV Peak grad:  3.9 mmHg AV Area (VTI):     2.23 cm AV Vmax:           158.50 cm/s AV Vmean:          108.000 cm/s AV VTI:            0.298 m AV Peak Grad:      10.0 mmHg AV Mean Grad:      5.5 mmHg LVOT Vmax:         68.70 cm/s LVOT Vmean:        44.400 cm/s LVOT VTI:          0.160 m LVOT/AV VTI ratio: 0.54  AORTA Ao Root diam: 3.50 cm Ao Asc diam:  3.40 cm MITRAL VALVE MV Area (PHT): 3.51 cm     SHUNTS MV Decel Time: 216 msec     Systemic VTI:  0.16 m MV E velocity: 87.00 cm/s   Systemic Diam: 2.30 cm MV A velocity: 108.00 cm/s MV E/A ratio:  0.81 Marcina Millard MD Electronically signed by Marcina Millard MD Signature Date/Time: 12/11/2022/11:20:02 AM    Final     Assessment/Plan 1. Hemiparesis affecting right side as late effect of cerebrovascular accident (CVA) (HCC) occurred on  12/09/2022 Patient with recent CVA. Continues to have right sided weakness. Admitted to LTC for PT/OT. Started on ASA 81 mg for 21 days, continue plavix 75 mg indefinitely. Continue atorvastatin 80 mg and fenofibrate 54 mg daily.   2. Essential hypertension BP well-controlled. Continue losartan 100 mg daily. Continue demedex 20 for leg swelling. Continue magnesium and potassium supplememtation.   3. Chronic combined systolic and diastolic CHF (congestive heart failure) (HCC) Appears euvolemic on exam. Patient taiking daily demedex. Discussed concern for overdiuresis. Potassium low on last check in the hospital. Continue Potassium daily. Recheck BMP 1 week. Consider making demedex PRN while in skilled nursing.  4. AV block, Mobitz 1 5. NSTEMI (non-ST elevated myocardial infarction) (HCC) Nitroglycerine PRN. NO symptoms at this time.   6. Type 2 diabetes mellitus with diabetic peripheral angiopathy without gangrene, with long-term current use of insulin (HCC) No wounds on lower extremity at this time. Keep close monitor. A1c 7.4 on admission. Continu lantus 40 units daily, novolog 35 am, 28 at lunch and 18 at dinner. ACHS glucose checks.   7. Hypertension associated with diabetes (HCC) BP well-controlled. A1c on admission 7.4. Discussed comorbid condition and goal of less than 7 if possible, however, given age may widen goal parameters to 8.0. Continued care by Dr. Phineas Douglas with endocrinology.   8. Acquired hypothyroidism Continue synthroid 50 mcg daily  9. Gastroesophageal reflux disease, unspecified whether esophagitis present Continue Omeprazole 20 mg daily given adequate symptom control.     Family/ staff Communication: Daughter Chip Boer, caregiver, Alvino Chapel, Nursing  Labs/tests ordered: BMP in 1 week  I spent greater than 45 minutes for the care of this patient with 30 minutes of face to face time, 10 minutes discussion with family and additional time for chart review.   Marland Kitchenvbsgi

## 2022-12-27 ENCOUNTER — Telehealth: Payer: Self-pay

## 2022-12-27 NOTE — Telephone Encounter (Signed)
Copied from [place] [id number]. Topic: Appointment Scheduling - Scheduling Inquiry for Clinic >> Dec 27, 2022 11:51 AM Marcellus Scott wrote: Reason for CRM: The patient's daughter stated pt had a stroke and is currently at [facility]. Requesting to cancel an upcoming AWV. Declined to reschedule at this time.  Please advise.

## 2023-01-03 DIAGNOSIS — I872 Venous insufficiency (chronic) (peripheral): Secondary | ICD-10-CM | POA: Diagnosis not present

## 2023-01-03 DIAGNOSIS — E1151 Type 2 diabetes mellitus with diabetic peripheral angiopathy without gangrene: Secondary | ICD-10-CM | POA: Diagnosis not present

## 2023-01-03 DIAGNOSIS — M79675 Pain in left toe(s): Secondary | ICD-10-CM | POA: Diagnosis not present

## 2023-01-03 DIAGNOSIS — B351 Tinea unguium: Secondary | ICD-10-CM | POA: Diagnosis not present

## 2023-01-03 DIAGNOSIS — Z794 Long term (current) use of insulin: Secondary | ICD-10-CM | POA: Diagnosis not present

## 2023-01-03 DIAGNOSIS — M79674 Pain in right toe(s): Secondary | ICD-10-CM | POA: Diagnosis not present

## 2023-01-11 ENCOUNTER — Ambulatory Visit: Payer: HMO | Admitting: Family Medicine

## 2023-01-12 ENCOUNTER — Encounter: Payer: Self-pay | Admitting: Nurse Practitioner

## 2023-01-12 ENCOUNTER — Non-Acute Institutional Stay (SKILLED_NURSING_FACILITY): Payer: PPO | Admitting: Nurse Practitioner

## 2023-01-12 DIAGNOSIS — I25708 Atherosclerosis of coronary artery bypass graft(s), unspecified, with other forms of angina pectoris: Secondary | ICD-10-CM

## 2023-01-12 DIAGNOSIS — I69351 Hemiplegia and hemiparesis following cerebral infarction affecting right dominant side: Secondary | ICD-10-CM

## 2023-01-12 DIAGNOSIS — I10 Essential (primary) hypertension: Secondary | ICD-10-CM | POA: Diagnosis not present

## 2023-01-12 DIAGNOSIS — E039 Hypothyroidism, unspecified: Secondary | ICD-10-CM | POA: Diagnosis not present

## 2023-01-12 DIAGNOSIS — K219 Gastro-esophageal reflux disease without esophagitis: Secondary | ICD-10-CM | POA: Diagnosis not present

## 2023-01-12 DIAGNOSIS — E1151 Type 2 diabetes mellitus with diabetic peripheral angiopathy without gangrene: Secondary | ICD-10-CM | POA: Diagnosis not present

## 2023-01-12 DIAGNOSIS — Z794 Long term (current) use of insulin: Secondary | ICD-10-CM

## 2023-01-12 DIAGNOSIS — I5042 Chronic combined systolic (congestive) and diastolic (congestive) heart failure: Secondary | ICD-10-CM

## 2023-01-12 NOTE — Progress Notes (Signed)
Location:  Other [facility] Room Number: 103-A Place of Service:  SNF [phone]) Provider:  Malcolm Metro, MD  Patient Care Team: [name], MD as PCP - General (Family Medicine) Minna Merritts, MD as PCP - Cardiology (Cardiology) Gabriel Carina Betsey Holiday, MD as Physician Assistant (Endocrinology) Marsh Dolly, MD as Referring Physician (Internal Medicine)  Extended Emergency Contact Information Primary Emergency Contact: [facility] Phone: [phone] Relation: Daughter Secondary Emergency Contact: [facility] Phone: [phone] Relation: Son  Code Status:  Full Code Goals of care: Advanced Directive information    01/12/2023   11:40 AM  Advanced Directives  Does Patient Have a Medical Advance Directive? Yes  Type of Advance Directive [place]  Does patient want to make changes to medical advance directive? No - Patient declined  Copy of [place] in Chart? Yes - validated most recent copy scanned in chart (See row information)     Chief Complaint  Patient presents with   Routine    HPI:  Pt is a 89 y.o. male seen today for medical management of chronic diseases.  He is at [facility] for rehab after acute CVA.  He was left with right sided weakness which has continued.  Continues to work with therapy making progress. Unsure if he will be able to live independently Arm was very weak but now he is moving right arm and hand better.  Right side of face was numb but now he can feel it.   Bp ranging from 131-160/53-84  Reports blood sugars have been elevated. Fasting blood sugars have been 200-250. No hypoglycemic episodes   He is sleeping well but staff wakes him up early    No chest pains. No LE edema.   TSH was 4.546 in March 2023  Having nose bleeds   GERD- controlled on omeprazole.   Past Medical History:  Diagnosis Date   Acute respiratory  failure with hypoxia ([place]) 03/21/2016   CAD (coronary artery disease)    a. s/p CABG;  b. 07/2012 Cath: 3VD including 80 dLCX (med Rx), 4/4 patent grafts.   Chronic diastolic CHF (congestive heart failure) ([place])    a. 07/2012 Echo: EF 55-60%, no rwma, Gr 1 DD, mild MR;  04/2014 Echo: EF 55-60%, no rwma, mild MR, mildly dil LA.   Diabetes mellitus, type 2 ([place])    HTN (hypertension)    Hyperlipidemia    Hypothyroidism    Lower extremity edema    a. 03/2014 amlodipine d/c'd.   Spinal stenosis    Past Surgical History:  Procedure Laterality Date   APPENDECTOMY     CARDIAC CATHETERIZATION  07/2012   [facility]/no stents   CHOLECYSTECTOMY     CORNEAL TRANSPLANT     CORONARY ARTERY BYPASS GRAFT  1994   HEMORRHOID SURGERY     LEFT HEART CATH AND CORS/GRAFTS ANGIOGRAPHY N/A 12/17/2019   Procedure: LEFT HEART CATH AND CORS/GRAFTS ANGIOGRAPHY;  Surgeon: Nelva Bush, MD;  Location: [facility];  Service: Cardiovascular;  Laterality: N/A;   PROSTATE BIOPSY     TOE SURGERY     ingrown toe nail    Allergies  Allergen Reactions   Farxiga [Dapagliflozin] Rash    Genital rash    Outpatient Encounter Medications as of 01/12/2023  Medication Sig   atorvastatin (LIPITOR) 80 MG tablet Take 1 tablet by mouth once daily   Cholecalciferol 125 MCG (5000 UT) capsule Take 5,000 Units by mouth at bedtime.  clopidogrel (PLAVIX) 75 MG tablet Take 1 tablet by mouth once daily   fenofibrate 54 MG tablet Take 1 tablet (54 mg total) by mouth daily.   Infant Care Products [facility]) OINT Apply topically. Apply to peri area topically every shift for skin protectant   insulin aspart (NOVOLOG) 100 UNIT/ML injection Inject 18 units subcutaneously in the evening, Inject 28 units subcutaneously once daily, Inject 35units subcutaneously one time daily.   insulin glargine (LANTUS) 100 UNIT/ML injection Inject 40 Units into the skin daily.   levothyroxine (SYNTHROID) 50 MCG tablet TAKE 1 TABLET BY MOUTH ONCE DAILY  BEFORE BREAKFAST   losartan (COZAAR) 100 MG tablet Take 1 tablet by mouth once daily   Magnesium 250 MG TABS Take 250 mg by mouth as needed (swelling). Take when you take your Torsemide.   nitroGLYCERIN (NITROSTAT) 0.4 MG SL tablet Place 1 tablet (0.4 mg total) under the tongue every 5 (five) minutes as needed for chest pain.   omeprazole (PRILOSEC) 20 MG capsule Take 1 capsule by mouth once daily   potassium chloride (KLOR-CON M10) 10 MEQ tablet Take 2 tablets (20 mEq total) by mouth daily.   torsemide (DEMADEX) 20 MG tablet Take 1 tablet (20 mg total) by mouth daily.   No facility-administered encounter medications on file as of 01/12/2023.    Review of Systems  Constitutional:  Negative for activity change, appetite change, fatigue and unexpected weight change.  HENT:  Negative for congestion and hearing loss.   Eyes: Negative.   Respiratory:  Negative for cough and shortness of breath.   Cardiovascular:  Negative for chest pain, palpitations and leg swelling.  Gastrointestinal:  Negative for abdominal pain, constipation and diarrhea.  Genitourinary:  Negative for difficulty urinating and dysuria.  Musculoskeletal:  Negative for arthralgias and myalgias.  Skin:  Negative for color change and wound.  Neurological:  Positive for weakness. Negative for dizziness.  Psychiatric/Behavioral:  Negative for agitation, behavioral problems and confusion.     Immunization History  Administered Date(s) Administered   Fluad Quad(high Dose 65+) 11/07/2019, 09/30/2021, 09/23/2022   Influenza Split 08/23/2011   Influenza, High Dose Seasonal PF 09/01/2015, 08/08/2017   Influenza,inj,Quad PF,6+ Mos 08/05/2013, 08/04/2014, 08/18/2016   PFIZER(Purple Top)SARS-COV-2 Vaccination 02/24/2020, 03/18/2020   Pneumococcal Conjugate-13 03/24/2015   Pneumococcal Polysaccharide-23 10/18/2014   Tdap 08/04/2014   Zoster, Live 08/04/2014   Pertinent  Health Maintenance Due  Topic Date Due   OPHTHALMOLOGY EXAM   11/17/2020   FOOT EXAM  09/22/2021   HEMOGLOBIN A1C  06/09/2023   INFLUENZA VACCINE  Completed      05/05/2020    1:47 PM 01/25/2021    1:39 PM 01/05/2022    2:12 PM 03/02/2022    3:45 PM 11/10/2022    8:25 AM  Fall Risk  Falls in the past year? 0 0 0 0 1  Was there an injury with Fall?  0  0 0  Fall Risk Category Calculator  0  0 1  Fall Risk Category (Retired)  Low  Low Low  (RETIRED) Patient Fall Risk Level Moderate fall risk   Low fall risk Low fall risk  Patient at Risk for Falls Due to Medication side effect   No Fall Risks No Fall Risks  Fall risk Follow up Falls prevention discussed   Falls evaluation completed    Functional Status Survey:    Vitals:   01/12/23 1124  BP: (!) 157/84  Pulse: 66  Resp: 18  Temp: (!) 97.1 F (36.2 C)  SpO2: 95%  Weight: 208 lb 12.8 oz (94.7 kg)  Height: '5\' 7"'$  (1.702 m)   Body mass index is 32.7 kg/m. Physical Exam Constitutional:      General: He is not in acute distress.    Appearance: He is well-developed. He is not diaphoretic.  HENT:     Head: Normocephalic and atraumatic.     Right Ear: External ear normal.     Left Ear: External ear normal.     Mouth/Throat:     Pharynx: No oropharyngeal exudate.  Eyes:     Conjunctiva/sclera: Conjunctivae normal.     Pupils: Pupils are equal, round, and reactive to light.  Cardiovascular:     Rate and Rhythm: Normal rate and regular rhythm.     Heart sounds: Normal heart sounds.  Pulmonary:     Effort: Pulmonary effort is normal.     Breath sounds: Normal breath sounds.  Abdominal:     General: Bowel sounds are normal.     Palpations: Abdomen is soft.  Musculoskeletal:        General: No tenderness.     Cervical back: Normal range of motion and neck supple.     Right lower leg: No edema.     Left lower leg: No edema.  Skin:    General: Skin is warm and dry.  Neurological:     Mental Status: He is alert and oriented to person, place, and time.     Labs reviewed: Recent Labs     04/28/22 1014 05/10/22 1552 06/01/22 1532 06/08/22 1338 12/12/22 0527 12/13/22 0523 12/14/22 0439  NA 139   < > 139   < > 140 139 139  K 4.0   < > 4.7   < > 3.2* 3.5 3.2*  CL 103   < > 100   < > 107 107 109  CO2 29   < > 24   < > '25 24 25  '$ GLUCOSE 214*   < > 191*   < > 136* 175* 166*  BUN 19   < > 20   < > '15 17 19  '$ CREATININE 1.04   < > 1.21   < > 0.96 0.95 1.08  CALCIUM 9.3   < > 9.3   < > 8.5* 8.3* 8.5*  MG 1.8  --   --   --  1.7  --   --   PHOS  --   --  3.4  --   --   --   --    < > = values in this interval not displayed.   Recent Labs    06/01/22 1532  ALBUMIN 3.9   Recent Labs    12/12/22 0527 12/13/22 0523 12/14/22 0439  WBC 6.1 6.2 7.1  HGB 12.1* 12.3* 12.7*  HCT 35.7* 36.5* 37.3*  MCV 89.9 89.7 90.5  PLT 185 190 196   Lab Results  Component Value Date   TSH 2.220 05/11/2020   Lab Results  Component Value Date   HGBA1C 7.4 (H) 12/09/2022   Lab Results  Component Value Date   CHOL 121 12/10/2022   HDL 26 (L) 12/10/2022   LDLCALC 52 12/10/2022   TRIG 215 (H) 12/10/2022   CHOLHDL 4.7 12/10/2022    Significant Diagnostic Results in last 30 days:  No results found.  Assessment/Plan 1. Hemiparesis affecting right side as late effect of cerebrovascular accident (CVA) ([place]) Has improved right sided weakness with therapy, continues to work on making progress for strength  training. Continues on plavix and lipitor  2. Essential hypertension -add hydralazine 25 mg by mouth every 8 hours needed for better htn control .   3. Chronic combined systolic and diastolic CHF (congestive heart failure) (HCC) -stable, without worsening edema, continues on torsemide, hydralazine, and losartan   4. Type 2 diabetes mellitus with diabetic peripheral angiopathy without gangrene, with long-term current use of insulin (HCC) - increase lantus to 44 units, continues novolog.  -discussed dietary modifications  -Encouraged dietary compliance, routine foot  care/monitoring and to keep up with diabetic eye exams through ophthalmology   5. Acquired hypothyroidism Continues on synthroid 50 mcg. TSH at goal in March  6. Gastroesophageal reflux disease, unspecified whether esophagitis present Controlled on omeprazole  7. Coronary artery disease of bypass graft with stable angina pectoris, unspecified whether native or transplanted heart ([place]) -stable, continues on plavix and statin. Not having any chest pains at this time.   Joseph [name]. [place], [facility] [phone]

## 2023-01-13 ENCOUNTER — Encounter: Payer: Self-pay | Admitting: Adult Health

## 2023-01-13 ENCOUNTER — Non-Acute Institutional Stay (SKILLED_NURSING_FACILITY): Payer: PPO | Admitting: Adult Health

## 2023-01-13 DIAGNOSIS — R040 Epistaxis: Secondary | ICD-10-CM | POA: Diagnosis not present

## 2023-01-13 DIAGNOSIS — E1151 Type 2 diabetes mellitus with diabetic peripheral angiopathy without gangrene: Secondary | ICD-10-CM | POA: Diagnosis not present

## 2023-01-13 DIAGNOSIS — I10 Essential (primary) hypertension: Secondary | ICD-10-CM | POA: Diagnosis not present

## 2023-01-13 DIAGNOSIS — Z794 Long term (current) use of insulin: Secondary | ICD-10-CM

## 2023-01-13 MED ORDER — HYDRALAZINE HCL 50 MG PO TABS: 50.0000 mg | ORAL_TABLET | Freq: Two times a day (BID) | ORAL | 3 refills | Status: DC

## 2023-01-13 NOTE — Progress Notes (Unsigned)
Location:  Other Nursing Home Room Number: [place] of Service:  SNF (31) Provider:  Durenda Age, NP   Patient Care Team: [name], MD as PCP - General (Family Medicine) Minna Merritts, MD as PCP - Cardiology (Cardiology) Gabriel Carina Betsey Holiday, MD as Physician Assistant (Endocrinology) Marsh Dolly, MD as Referring Physician (Internal Medicine)  Extended Emergency Contact Information Primary Emergency Contact: [facility] Phone: [phone] Relation: Daughter Secondary Emergency Contact: [facility] Phone: [phone] Relation: Son  Code Status:  Full Code Goals of care: Advanced Directive information    01/13/2023   10:26 AM  Advanced Directives  Does Patient Have a Medical Advance Directive? Yes  Type of Advance Directive [place]  Does patient want to make changes to medical advance directive? No - Patient declined  Copy of [place] in Chart? Yes - validated most recent copy scanned in chart (See row information)     Chief Complaint  Patient presents with   Acute Visit    Nosebleeds    HPI:  Pt is a 89 y.o. male seen today for an acute visit for nosebleed episode last night. He was seen in the room today with caregiver at bedside who reported that he has episode of nosebleed  before. Review of his SBPs ranging from 117 to 172. He takes Hydralazine and Losartan for hypertension. He takes Lantus and Novologfor diabetes mellitus. CBGs ranging from 96 to 300.    Past Medical History:  Diagnosis Date   Acute respiratory failure with hypoxia ([place]) 03/21/2016   CAD (coronary artery disease)    a. s/p CABG;  b. 07/2012 Cath: 3VD including 80 dLCX (med Rx), 4/4 patent grafts.   Chronic diastolic CHF (congestive heart failure) ([place])    a. 07/2012 Echo: EF 55-60%, no rwma, Gr 1 DD, mild MR;  04/2014 Echo: EF 55-60%, no rwma, mild MR, mildly dil LA.   Diabetes mellitus, type 2 ([place])    HTN  (hypertension)    Hyperlipidemia    Hypothyroidism    Lower extremity edema    a. 03/2014 amlodipine d/c'd.   Spinal stenosis    Past Surgical History:  Procedure Laterality Date   APPENDECTOMY     CARDIAC CATHETERIZATION  07/2012   [facility]/no stents   CHOLECYSTECTOMY     CORNEAL TRANSPLANT     CORONARY ARTERY BYPASS GRAFT  1994   HEMORRHOID SURGERY     LEFT HEART CATH AND CORS/GRAFTS ANGIOGRAPHY N/A 12/17/2019   Procedure: LEFT HEART CATH AND CORS/GRAFTS ANGIOGRAPHY;  Surgeon: Nelva Bush, MD;  Location: [facility];  Service: Cardiovascular;  Laterality: N/A;   PROSTATE BIOPSY     TOE SURGERY     ingrown toe nail    Allergies  Allergen Reactions   Farxiga [Dapagliflozin] Rash    Genital rash    Outpatient Encounter Medications as of 01/13/2023  Medication Sig   atorvastatin (LIPITOR) 80 MG tablet Take 1 tablet by mouth once daily   Cholecalciferol 125 MCG (5000 UT) capsule Take 5,000 Units by mouth at bedtime.   clopidogrel (PLAVIX) 75 MG tablet Take 1 tablet by mouth once daily   fenofibrate 54 MG tablet Take 1 tablet (54 mg total) by mouth daily.   Infant Care Products [facility]) OINT Apply topically. Apply to peri area topically every shift for skin protectant   insulin aspart (NOVOLOG) 100 UNIT/ML injection Inject 18 units subcutaneously in the evening, Inject 28 units subcutaneously once daily, Inject 35units subcutaneously  one time daily.   insulin glargine (LANTUS) 100 UNIT/ML injection Inject 40 Units into the skin daily.   levothyroxine (SYNTHROID) 50 MCG tablet TAKE 1 TABLET BY MOUTH ONCE DAILY BEFORE BREAKFAST   losartan (COZAAR) 100 MG tablet Take 1 tablet by mouth once daily   Magnesium 250 MG TABS Take 250 mg by mouth as needed (swelling). Take when you take your Torsemide.   nitroGLYCERIN (NITROSTAT) 0.4 MG SL tablet Place 1 tablet (0.4 mg total) under the tongue every 5 (five) minutes as needed for chest pain.   omeprazole (PRILOSEC) 20 MG capsule  Take 1 capsule by mouth once daily   torsemide (DEMADEX) 20 MG tablet Take 1 tablet (20 mg total) by mouth daily.   potassium chloride (KLOR-CON M10) 10 MEQ tablet Take 2 tablets (20 mEq total) by mouth daily.   No facility-administered encounter medications on file as of 01/13/2023.    Review of Systems  Constitutional:  Negative for activity change, appetite change and fever.  HENT:  Positive for nosebleeds. Negative for sore throat.   Eyes: Negative.   Cardiovascular:  Negative for chest pain and leg swelling.  Gastrointestinal:  Negative for abdominal distention, diarrhea and vomiting.  Genitourinary:  Negative for dysuria, frequency and urgency.  Skin:  Negative for color change.  Neurological:  Negative for dizziness and headaches.  Psychiatric/Behavioral:  Negative for behavioral problems and sleep disturbance. The patient is not nervous/anxious.     Immunization History  Administered Date(s) Administered   COVID-19, mRNA, vaccine(Comirnaty)12 years and older 04/15/2020   Fluad Quad(high Dose 65+) 11/07/2019, 09/30/2021, 09/23/2022   Influenza Split 08/23/2011   Influenza, High Dose Seasonal PF 09/01/2015, 08/08/2017   Influenza,inj,Quad PF,6+ Mos 08/05/2013, 08/04/2014, 08/18/2016   Influenza-Unspecified 09/23/2022   PFIZER(Purple Top)SARS-COV-2 Vaccination 02/24/2020, 03/18/2020   Pneumococcal Conjugate-13 03/24/2015   Pneumococcal Polysaccharide-23 10/18/2014   Td (Adult),unspecified 04/01/2004   Tdap 08/04/2014   Zoster, Live 08/04/2014   Pertinent  Health Maintenance Due  Topic Date Due   OPHTHALMOLOGY EXAM  11/17/2020   FOOT EXAM  09/22/2021   HEMOGLOBIN A1C  06/09/2023   INFLUENZA VACCINE  Completed      01/25/2021    1:39 PM 01/05/2022    2:12 PM 03/02/2022    3:45 PM 11/10/2022    8:25 AM 01/13/2023   10:23 AM  Fall Risk  Falls in the past year? 0 0 0 1 0  Was there an injury with Fall? 0  0 0 0  Fall Risk Category Calculator 0  0 1 0  Fall Risk Category  (Retired) Low  Low Low   (RETIRED) Patient Fall Risk Level   Low fall risk Low fall risk   Patient at Risk for Falls Due to   No Fall Risks No Fall Risks No Fall Risks  Fall risk Follow up   Falls evaluation completed  Falls evaluation completed   Functional Status Survey:    Vitals:   01/13/23 1016  BP: (!) 159/71  Pulse: 69  Resp: 18  Temp: (!) 97.4 F (36.3 C)  SpO2: 95%  Weight: 208 lb 8 oz (94.6 kg)  Height: '5\' 7"'$  (1.702 m)   Body mass index is 32.66 kg/m. Physical Exam Constitutional:      Appearance: He is obese.  HENT:     Head: Normocephalic and atraumatic.     Mouth/Throat:     Mouth: Mucous membranes are moist.  Eyes:     Conjunctiva/sclera: Conjunctivae normal.  Cardiovascular:  Rate and Rhythm: Normal rate and regular rhythm.     Pulses: Normal pulses.     Heart sounds: Normal heart sounds.  Pulmonary:     Effort: Pulmonary effort is normal.     Breath sounds: Normal breath sounds.  Abdominal:     General: Bowel sounds are normal.     Palpations: Abdomen is soft.  Musculoskeletal:     Cervical back: Normal range of motion.  Skin:    General: Skin is warm and dry.  Neurological:     Mental Status: He is alert and oriented to person, place, and time. Mental status is at baseline.     Comments: Right-sided weakness  Psychiatric:        Mood and Affect: Mood normal.        Behavior: Behavior normal.        Thought Content: Thought content normal.        Judgment: Judgment normal.     Labs reviewed: Recent Labs    04/28/22 1014 05/10/22 1552 06/01/22 1532 06/08/22 1338 12/12/22 0527 12/13/22 0523 12/14/22 0439  NA 139   < > 139   < > 140 139 139  K 4.0   < > 4.7   < > 3.2* 3.5 3.2*  CL 103   < > 100   < > 107 107 109  CO2 29   < > 24   < > '25 24 25  '$ GLUCOSE 214*   < > 191*   < > 136* 175* 166*  BUN 19   < > 20   < > '15 17 19  '$ CREATININE 1.04   < > 1.21   < > 0.96 0.95 1.08  CALCIUM 9.3   < > 9.3   < > 8.5* 8.3* 8.5*  MG 1.8  --   --    --  1.7  --   --   PHOS  --   --  3.4  --   --   --   --    < > = values in this interval not displayed.   Recent Labs    06/01/22 1532  ALBUMIN 3.9   Recent Labs    12/12/22 0527 12/13/22 0523 12/14/22 0439  WBC 6.1 6.2 7.1  HGB 12.1* 12.3* 12.7*  HCT 35.7* 36.5* 37.3*  MCV 89.9 89.7 90.5  PLT 185 190 196   Lab Results  Component Value Date   TSH 2.220 05/11/2020   Lab Results  Component Value Date   HGBA1C 7.4 (H) 12/09/2022   Lab Results  Component Value Date   CHOL 121 12/10/2022   HDL 26 (L) 12/10/2022   LDLCALC 52 12/10/2022   TRIG 215 (H) 12/10/2022   CHOLHDL 4.7 12/10/2022    Significant Diagnostic Results in last 30 days:  No results found.  Assessment/Plan  1. Epistaxis -  had nosebleed X 1 last night -  will start Saline nasal spray instill 2 sprays to each nostril daily BID for moisture   2. Essential hypertension -  Bps elevated -  will increase Hydralazine from 25 mg TID to 50 mg BID and continue Losartan -  monitor BPs - hydrALAZINE (APRESOLINE) 50 MG tablet; Take 1 tablet (50 mg total) by mouth 2 (two) times daily.  Dispense: 60 tablet; Refill: 3  3. Type 2 diabetes mellitus with diabetic peripheral angiopathy without gangrene, with long-term current use of insulin (HCC) Lab Results  Component Value Date   HGBA1C 7.4 (H) 12/09/2022   -  continue Lantus and Novolog -  monitor CBGs    Family/ staff Communication:   Discussed plan of care with resident, caregiver and charge nurse.  Labs/tests ordered:  CBC with differentials

## 2023-01-16 LAB — CBC AND DIFFERENTIAL
HCT: 38 — AB (ref 41–53)
Hemoglobin: 12.6 — AB (ref 13.5–17.5)
Neutrophils Absolute: 3250
Platelets: 238 10*3/uL (ref 150–400)
WBC: 6.9

## 2023-01-16 LAB — CBC: RBC: 4.15 (ref 3.87–5.11)

## 2023-01-23 ENCOUNTER — Telehealth: Payer: Self-pay

## 2023-01-23 NOTE — Telephone Encounter (Signed)
Copied from [place] [phone]. Topic: General - Other >> Jan 23, 2023  9:33 AM Sabas Sous wrote: Reason for CRM: Sherren Mocha from [facility] called to report that the patient is discharging from their services today.  [phone]

## 2023-01-24 ENCOUNTER — Telehealth: Payer: Self-pay

## 2023-01-31 ENCOUNTER — Telehealth: Payer: Self-pay | Admitting: Family Medicine

## 2023-01-31 DIAGNOSIS — R040 Epistaxis: Secondary | ICD-10-CM

## 2023-01-31 NOTE — Telephone Encounter (Signed)
Brandy with [facility] is calling in because she says pt has been having frequent nose bleeds. She says he has them at least 1/day which is an increase from before. She says he had a stroke in January. Theadora Rama says she wanted to get pt's PCP opinion on the frequent nose bleeds. Please advise. [place]

## 2023-02-01 MED ORDER — OXYMETAZOLINE HCL 0.05 % NA SOLN: NASAL | 0 refills | Status: DC

## 2023-02-01 NOTE — Telephone Encounter (Signed)
Please schedule patient for office visit if bleeding persists.   He is ok to use Afrin (2 sprays per nostril once do not use daily) to help with bleeding until evaluation with me.  RX for nasal spray has been sent to [place], MD  [facility]

## 2023-02-01 NOTE — Telephone Encounter (Signed)
No answer when I called the patient - appears he is establishing with Rosanna Randy in April.

## 2023-02-01 NOTE — Telephone Encounter (Signed)
Would you like for patient to come in to be seen?

## 2023-02-07 DIAGNOSIS — I69398 Other sequelae of cerebral infarction: Secondary | ICD-10-CM | POA: Diagnosis not present

## 2023-02-07 DIAGNOSIS — Z9181 History of falling: Secondary | ICD-10-CM | POA: Diagnosis not present

## 2023-02-07 DIAGNOSIS — I5042 Chronic combined systolic (congestive) and diastolic (congestive) heart failure: Secondary | ICD-10-CM | POA: Diagnosis not present

## 2023-02-22 NOTE — Transitions of Care (Post Inpatient/ED Visit) (Signed)
   02/22/2023  Name: Joseph [name] MRN: [id number] DOB: [birth year] error Today's TOC FU Call Status:    Attempted to reach the patient regarding the most recent Inpatient/ED visit.  Follow Up Plan: No further outreach attempts will be made at this time. We have been unable to contact the patient.  Signature Juanda Crumble, [place] Direct Dial [phone]

## 2023-02-24 ENCOUNTER — Other Ambulatory Visit: Payer: Self-pay | Admitting: Cardiovascular Disease

## 2023-02-24 DIAGNOSIS — I10 Essential (primary) hypertension: Secondary | ICD-10-CM

## 2023-02-24 MED ORDER — CLOPIDOGREL BISULFATE 75 MG PO TABS: 75.0000 mg | ORAL_TABLET | Freq: Every day | ORAL | 0 refills | Status: DC

## 2023-02-24 MED ORDER — LOSARTAN POTASSIUM 100 MG PO TABS: 100.0000 mg | ORAL_TABLET | Freq: Every day | ORAL | 0 refills | Status: DC

## 2023-02-24 MED ORDER — TORSEMIDE 20 MG PO TABS: 20.0000 mg | ORAL_TABLET | Freq: Every day | ORAL | 0 refills | Status: DC

## 2023-02-24 MED ORDER — ATORVASTATIN CALCIUM 80 MG PO TABS: 80.0000 mg | ORAL_TABLET | Freq: Every day | ORAL | 0 refills | Status: DC

## 2023-02-24 NOTE — Telephone Encounter (Signed)
Please contact pt for future appointment. Pt due for follow up needing refills.

## 2023-02-24 NOTE — Telephone Encounter (Signed)
Pt aware that he needs to contact pcp to have Levothyroxine filled.

## 2023-02-24 NOTE — Telephone Encounter (Signed)
Requested Prescriptions   Pending Prescriptions Disp Refills   hydrALAZINE (APRESOLINE) 50 MG tablet 180 tablet 0    Sig: Take 1 tablet (50 mg total) by mouth 2 (two) times daily.   potassium chloride (KLOR-CON M10) 10 MEQ tablet 180 tablet 0    Sig: Take 2 tablets (20 mEq total) by mouth daily.   fenofibrate 54 MG tablet 90 tablet 0    Sig: Take 1 tablet (54 mg total) by mouth daily.   Signed Prescriptions Disp Refills   clopidogrel (PLAVIX) 75 MG tablet 90 tablet 0    Sig: Take 1 tablet (75 mg total) by mouth daily.    Authorizing Provider: Antonieta Iba    Ordering User: Iverson Alamin C   losartan (COZAAR) 100 MG tablet 90 tablet 0    Sig: Take 1 tablet (100 mg total) by mouth daily.    Authorizing Provider: Antonieta Iba    Ordering User: Iverson Alamin C   atorvastatin (LIPITOR) 80 MG tablet 90 tablet 0    Sig: Take 1 tablet (80 mg total) by mouth daily.    Authorizing Provider: Antonieta Iba    Ordering User: Iverson Alamin C   torsemide (DEMADEX) 20 MG tablet 90 tablet 0    Sig: Take 1 tablet (20 mg total) by mouth daily.    Authorizing Provider: Marianne Sofia    Ordering User: Kendrick Fries

## 2023-02-24 NOTE — Telephone Encounter (Signed)
*  STAT* If patient is at the pharmacy, call can be transferred to refill team.   1. Which medications need to be refilled? (please list name of each medication and dose if known)   clopidogrel (PLAVIX) 75 MG tablet  losartan (COZAAR) 100 MG tablet  atorvastatin (LIPITOR) 80 MG tablet  torsemide (DEMADEX) 20 MG tablet  hydrALAZINE (APRESOLINE) 50 MG tablet  levothyroxine (SYNTHROID) 50 MCG tablet  potassium chloride (KLOR-CON M10) 10 MEQ tablet (Expired)  fenofibrate 54 MG tablet   2. Which pharmacy/location (including street and city if local pharmacy) is medication to be sent to?  [facility] [address] [place] - [address]   3. Do they need a 30 day or 90 day supply?   90 day  Daughter stated patient has been in rehab and nursing home and will need new prescriptions for these medication.  Daughter stated patient will run out of medications before the end of April.

## 2023-02-24 NOTE — Telephone Encounter (Signed)
Pt will contact PCP to have meds filled.

## 2023-02-24 NOTE — Telephone Encounter (Signed)
Pt scheduled on 7/12, wanted [place] only and this was the first available appt. Pt added to wait list.

## 2023-02-24 NOTE — Addendum Note (Signed)
Addended by: Kendrick Fries on: 02/24/2023 03:01 PM   Modules accepted: Orders

## 2023-03-09 DIAGNOSIS — R809 Proteinuria, unspecified: Secondary | ICD-10-CM | POA: Diagnosis not present

## 2023-03-09 DIAGNOSIS — Z794 Long term (current) use of insulin: Secondary | ICD-10-CM | POA: Diagnosis not present

## 2023-03-09 DIAGNOSIS — E1129 Type 2 diabetes mellitus with other diabetic kidney complication: Secondary | ICD-10-CM | POA: Diagnosis not present

## 2023-03-10 DIAGNOSIS — Z9181 History of falling: Secondary | ICD-10-CM | POA: Diagnosis not present

## 2023-03-10 DIAGNOSIS — I69398 Other sequelae of cerebral infarction: Secondary | ICD-10-CM | POA: Diagnosis not present

## 2023-03-10 DIAGNOSIS — I5042 Chronic combined systolic (congestive) and diastolic (congestive) heart failure: Secondary | ICD-10-CM | POA: Diagnosis not present

## 2023-03-16 DIAGNOSIS — R809 Proteinuria, unspecified: Secondary | ICD-10-CM | POA: Diagnosis not present

## 2023-03-16 DIAGNOSIS — E1159 Type 2 diabetes mellitus with other circulatory complications: Secondary | ICD-10-CM | POA: Diagnosis not present

## 2023-03-16 DIAGNOSIS — E1129 Type 2 diabetes mellitus with other diabetic kidney complication: Secondary | ICD-10-CM | POA: Diagnosis not present

## 2023-03-16 DIAGNOSIS — Z794 Long term (current) use of insulin: Secondary | ICD-10-CM | POA: Diagnosis not present

## 2023-03-16 DIAGNOSIS — E785 Hyperlipidemia, unspecified: Secondary | ICD-10-CM | POA: Diagnosis not present

## 2023-03-16 DIAGNOSIS — E1142 Type 2 diabetes mellitus with diabetic polyneuropathy: Secondary | ICD-10-CM | POA: Diagnosis not present

## 2023-03-16 DIAGNOSIS — Z8673 Personal history of transient ischemic attack (TIA), and cerebral infarction without residual deficits: Secondary | ICD-10-CM | POA: Diagnosis not present

## 2023-03-16 DIAGNOSIS — E1169 Type 2 diabetes mellitus with other specified complication: Secondary | ICD-10-CM | POA: Diagnosis not present

## 2023-03-21 ENCOUNTER — Ambulatory Visit: Payer: HMO | Admitting: Cardiovascular Disease

## 2023-03-22 DIAGNOSIS — I4891 Unspecified atrial fibrillation: Secondary | ICD-10-CM

## 2023-03-22 HISTORY — DX: Unspecified atrial fibrillation: I48.91

## 2023-03-23 DIAGNOSIS — I69351 Hemiplegia and hemiparesis following cerebral infarction affecting right dominant side: Secondary | ICD-10-CM | POA: Diagnosis not present

## 2023-03-23 DIAGNOSIS — Z794 Long term (current) use of insulin: Secondary | ICD-10-CM | POA: Diagnosis not present

## 2023-03-23 DIAGNOSIS — E1151 Type 2 diabetes mellitus with diabetic peripheral angiopathy without gangrene: Secondary | ICD-10-CM | POA: Diagnosis not present

## 2023-03-23 DIAGNOSIS — I5043 Acute on chronic combined systolic (congestive) and diastolic (congestive) heart failure: Secondary | ICD-10-CM | POA: Diagnosis not present

## 2023-03-23 DIAGNOSIS — Z951 Presence of aortocoronary bypass graft: Secondary | ICD-10-CM | POA: Diagnosis not present

## 2023-03-23 DIAGNOSIS — R2689 Other abnormalities of gait and mobility: Secondary | ICD-10-CM | POA: Diagnosis not present

## 2023-03-23 DIAGNOSIS — E039 Hypothyroidism, unspecified: Secondary | ICD-10-CM | POA: Diagnosis not present

## 2023-04-06 ENCOUNTER — Emergency Department: Payer: PPO

## 2023-04-06 ENCOUNTER — Inpatient Hospital Stay: Payer: PPO

## 2023-04-06 ENCOUNTER — Inpatient Hospital Stay
Admission: EM | Admit: 2023-04-06 | Discharge: 2023-04-11 | DRG: 065 | Disposition: A | Discharge status: Skilled Nursing Facility | Payer: PPO | Attending: Internal Medicine | Admitting: Internal Medicine

## 2023-04-06 DIAGNOSIS — E785 Hyperlipidemia, unspecified: Secondary | ICD-10-CM | POA: Diagnosis not present

## 2023-04-06 DIAGNOSIS — I5041 Acute combined systolic (congestive) and diastolic (congestive) heart failure: Secondary | ICD-10-CM | POA: Diagnosis not present

## 2023-04-06 DIAGNOSIS — Z9049 Acquired absence of other specified parts of digestive tract: Secondary | ICD-10-CM

## 2023-04-06 DIAGNOSIS — I6523 Occlusion and stenosis of bilateral carotid arteries: Secondary | ICD-10-CM | POA: Diagnosis not present

## 2023-04-06 DIAGNOSIS — R29703 NIHSS score 3: Secondary | ICD-10-CM | POA: Diagnosis not present

## 2023-04-06 DIAGNOSIS — G2581 Restless legs syndrome: Secondary | ICD-10-CM | POA: Diagnosis present

## 2023-04-06 DIAGNOSIS — Z751 Person awaiting admission to adequate facility elsewhere: Secondary | ICD-10-CM

## 2023-04-06 DIAGNOSIS — I639 Cerebral infarction, unspecified: Secondary | ICD-10-CM | POA: Diagnosis not present

## 2023-04-06 DIAGNOSIS — I739 Peripheral vascular disease, unspecified: Secondary | ICD-10-CM | POA: Diagnosis present

## 2023-04-06 DIAGNOSIS — Z8249 Family history of ischemic heart disease and other diseases of the circulatory system: Secondary | ICD-10-CM

## 2023-04-06 DIAGNOSIS — I130 Hypertensive heart and chronic kidney disease with heart failure and stage 1 through stage 4 chronic kidney disease, or unspecified chronic kidney disease: Secondary | ICD-10-CM | POA: Diagnosis not present

## 2023-04-06 DIAGNOSIS — I5042 Chronic combined systolic (congestive) and diastolic (congestive) heart failure: Secondary | ICD-10-CM | POA: Diagnosis not present

## 2023-04-06 DIAGNOSIS — Z888 Allergy status to other drugs, medicaments and biological substances status: Secondary | ICD-10-CM

## 2023-04-06 DIAGNOSIS — I6389 Other cerebral infarction: Secondary | ICD-10-CM | POA: Diagnosis not present

## 2023-04-06 DIAGNOSIS — Z6832 Body mass index (BMI) 32.0-32.9, adult: Secondary | ICD-10-CM | POA: Diagnosis not present

## 2023-04-06 DIAGNOSIS — R54 Age-related physical debility: Secondary | ICD-10-CM | POA: Diagnosis present

## 2023-04-06 DIAGNOSIS — G459 Transient cerebral ischemic attack, unspecified: Secondary | ICD-10-CM | POA: Diagnosis not present

## 2023-04-06 DIAGNOSIS — I69351 Hemiplegia and hemiparesis following cerebral infarction affecting right dominant side: Secondary | ICD-10-CM | POA: Diagnosis not present

## 2023-04-06 DIAGNOSIS — Z79899 Other long term (current) drug therapy: Secondary | ICD-10-CM

## 2023-04-06 DIAGNOSIS — Z951 Presence of aortocoronary bypass graft: Secondary | ICD-10-CM | POA: Diagnosis not present

## 2023-04-06 DIAGNOSIS — I161 Hypertensive emergency: Secondary | ICD-10-CM | POA: Diagnosis present

## 2023-04-06 DIAGNOSIS — Z823 Family history of stroke: Secondary | ICD-10-CM

## 2023-04-06 DIAGNOSIS — K219 Gastro-esophageal reflux disease without esophagitis: Secondary | ICD-10-CM | POA: Diagnosis not present

## 2023-04-06 DIAGNOSIS — I420 Dilated cardiomyopathy: Secondary | ICD-10-CM | POA: Diagnosis not present

## 2023-04-06 DIAGNOSIS — N1831 Chronic kidney disease, stage 3a: Secondary | ICD-10-CM | POA: Diagnosis not present

## 2023-04-06 DIAGNOSIS — I4819 Other persistent atrial fibrillation: Secondary | ICD-10-CM | POA: Diagnosis present

## 2023-04-06 DIAGNOSIS — E039 Hypothyroidism, unspecified: Secondary | ICD-10-CM | POA: Diagnosis not present

## 2023-04-06 DIAGNOSIS — Z87891 Personal history of nicotine dependence: Secondary | ICD-10-CM | POA: Diagnosis not present

## 2023-04-06 DIAGNOSIS — Z794 Long term (current) use of insulin: Secondary | ICD-10-CM

## 2023-04-06 DIAGNOSIS — N183 Chronic kidney disease, stage 3 unspecified: Secondary | ICD-10-CM | POA: Diagnosis not present

## 2023-04-06 DIAGNOSIS — J449 Chronic obstructive pulmonary disease, unspecified: Secondary | ICD-10-CM | POA: Diagnosis present

## 2023-04-06 DIAGNOSIS — M170 Bilateral primary osteoarthritis of knee: Secondary | ICD-10-CM | POA: Diagnosis present

## 2023-04-06 DIAGNOSIS — R531 Weakness: Secondary | ICD-10-CM | POA: Diagnosis not present

## 2023-04-06 DIAGNOSIS — Z79890 Hormone replacement therapy (postmenopausal): Secondary | ICD-10-CM

## 2023-04-06 DIAGNOSIS — I10 Essential (primary) hypertension: Secondary | ICD-10-CM

## 2023-04-06 DIAGNOSIS — N179 Acute kidney failure, unspecified: Secondary | ICD-10-CM | POA: Diagnosis not present

## 2023-04-06 DIAGNOSIS — Z7902 Long term (current) use of antithrombotics/antiplatelets: Secondary | ICD-10-CM

## 2023-04-06 DIAGNOSIS — R040 Epistaxis: Secondary | ICD-10-CM | POA: Diagnosis present

## 2023-04-06 DIAGNOSIS — Z9181 History of falling: Secondary | ICD-10-CM | POA: Diagnosis not present

## 2023-04-06 DIAGNOSIS — I5032 Chronic diastolic (congestive) heart failure: Secondary | ICD-10-CM | POA: Diagnosis present

## 2023-04-06 DIAGNOSIS — R29818 Other symptoms and signs involving the nervous system: Secondary | ICD-10-CM | POA: Diagnosis not present

## 2023-04-06 DIAGNOSIS — I4821 Permanent atrial fibrillation: Secondary | ICD-10-CM | POA: Diagnosis present

## 2023-04-06 DIAGNOSIS — E1151 Type 2 diabetes mellitus with diabetic peripheral angiopathy without gangrene: Secondary | ICD-10-CM | POA: Diagnosis not present

## 2023-04-06 DIAGNOSIS — I63419 Cerebral infarction due to embolism of unspecified middle cerebral artery: Secondary | ICD-10-CM | POA: Diagnosis not present

## 2023-04-06 DIAGNOSIS — I2510 Atherosclerotic heart disease of native coronary artery without angina pectoris: Secondary | ICD-10-CM | POA: Diagnosis not present

## 2023-04-06 DIAGNOSIS — R471 Dysarthria and anarthria: Secondary | ICD-10-CM | POA: Diagnosis present

## 2023-04-06 DIAGNOSIS — R278 Other lack of coordination: Secondary | ICD-10-CM | POA: Diagnosis present

## 2023-04-06 DIAGNOSIS — E669 Obesity, unspecified: Secondary | ICD-10-CM | POA: Diagnosis present

## 2023-04-06 DIAGNOSIS — I63233 Cerebral infarction due to unspecified occlusion or stenosis of bilateral carotid arteries: Secondary | ICD-10-CM | POA: Diagnosis not present

## 2023-04-06 DIAGNOSIS — E782 Mixed hyperlipidemia: Secondary | ICD-10-CM | POA: Diagnosis not present

## 2023-04-06 DIAGNOSIS — I25708 Atherosclerosis of coronary artery bypass graft(s), unspecified, with other forms of angina pectoris: Secondary | ICD-10-CM | POA: Diagnosis not present

## 2023-04-06 DIAGNOSIS — I6300 Cerebral infarction due to thrombosis of unspecified precerebral artery: Secondary | ICD-10-CM | POA: Diagnosis not present

## 2023-04-06 DIAGNOSIS — E876 Hypokalemia: Secondary | ICD-10-CM | POA: Diagnosis not present

## 2023-04-06 DIAGNOSIS — I69398 Other sequelae of cerebral infarction: Secondary | ICD-10-CM | POA: Diagnosis not present

## 2023-04-06 DIAGNOSIS — I63219 Cerebral infarction due to unspecified occlusion or stenosis of unspecified vertebral arteries: Secondary | ICD-10-CM | POA: Diagnosis not present

## 2023-04-06 DIAGNOSIS — E1122 Type 2 diabetes mellitus with diabetic chronic kidney disease: Secondary | ICD-10-CM | POA: Diagnosis present

## 2023-04-06 DIAGNOSIS — I252 Old myocardial infarction: Secondary | ICD-10-CM | POA: Diagnosis not present

## 2023-04-06 DIAGNOSIS — E1129 Type 2 diabetes mellitus with other diabetic kidney complication: Secondary | ICD-10-CM | POA: Diagnosis present

## 2023-04-06 DIAGNOSIS — I4891 Unspecified atrial fibrillation: Secondary | ICD-10-CM

## 2023-04-06 DIAGNOSIS — I4430 Unspecified atrioventricular block: Secondary | ICD-10-CM | POA: Diagnosis present

## 2023-04-06 DIAGNOSIS — R001 Bradycardia, unspecified: Secondary | ICD-10-CM | POA: Diagnosis present

## 2023-04-06 DIAGNOSIS — Z947 Corneal transplant status: Secondary | ICD-10-CM

## 2023-04-06 DIAGNOSIS — R29810 Facial weakness: Secondary | ICD-10-CM | POA: Diagnosis present

## 2023-04-06 LAB — CBC
HCT: 38.7 % — ABNORMAL LOW (ref 39.0–52.0)
Hemoglobin: 12.9 g/dL — ABNORMAL LOW (ref 13.0–17.0)
MCH: 30.3 pg (ref 26.0–34.0)
MCHC: 33.3 g/dL (ref 30.0–36.0)
MCV: 90.8 fL (ref 80.0–100.0)
Platelets: 256 10*3/uL (ref 150–400)
RBC: 4.26 MIL/uL (ref 4.22–5.81)
RDW: 14.1 % (ref 11.5–15.5)
WBC: 6.6 10*3/uL (ref 4.0–10.5)
nRBC: 0 % (ref 0.0–0.2)

## 2023-04-06 LAB — URINALYSIS, COMPLETE (UACMP) WITH MICROSCOPIC
Bacteria, UA: NONE SEEN
Bilirubin Urine: NEGATIVE
Glucose, UA: >=500 mg/dL — AB
Hgb urine dipstick: NEGATIVE
Ketones, ur: NEGATIVE mg/dL
Leukocytes,Ua: NEGATIVE
Nitrite: NEGATIVE
Protein, ur: NEGATIVE mg/dL
Specific Gravity, Urine: 1.018 (ref 1.005–1.030)
pH: 5 (ref 5.0–8.0)

## 2023-04-06 LAB — COMPREHENSIVE METABOLIC PANEL
ALT: 15 U/L (ref 0–44)
AST: 30 U/L (ref 15–41)
Albumin: 3.3 g/dL — ABNORMAL LOW (ref 3.5–5.0)
Alkaline Phosphatase: 39 U/L (ref 38–126)
Anion gap: 8 (ref 5–15)
BUN: 19 mg/dL (ref 8–23)
CO2: 24 mmol/L (ref 22–32)
Calcium: 8.7 mg/dL — ABNORMAL LOW (ref 8.9–10.3)
Chloride: 105 mmol/L (ref 98–111)
Creatinine, Ser: 1.32 mg/dL — ABNORMAL HIGH (ref 0.61–1.24)
GFR, Estimated: 50 mL/min — ABNORMAL LOW (ref >60)
Glucose, Bld: 240 mg/dL — ABNORMAL HIGH (ref 70–99)
Potassium: 3.6 mmol/L (ref 3.5–5.1)
Sodium: 137 mmol/L (ref 135–145)
Total Bilirubin: 1 mg/dL (ref 0.3–1.2)
Total Protein: 6.3 g/dL — ABNORMAL LOW (ref 6.5–8.1)

## 2023-04-06 LAB — GLUCOSE, CAPILLARY: Glucose-Capillary: 155 mg/dL — ABNORMAL HIGH (ref 70–99)

## 2023-04-06 LAB — PROTIME-INR
INR: 1.1 (ref 0.8–1.2)
Prothrombin Time: 14 seconds (ref 11.4–15.2)

## 2023-04-06 LAB — BRAIN NATRIURETIC PEPTIDE: B Natriuretic Peptide: 97.9 pg/mL (ref 0.0–100.0)

## 2023-04-06 LAB — APTT: aPTT: 26 seconds (ref 24–36)

## 2023-04-06 LAB — TROPONIN I (HIGH SENSITIVITY)
Troponin I (High Sensitivity): 14 ng/L (ref <18)
Troponin I (High Sensitivity): 14 ng/L (ref <18)

## 2023-04-06 LAB — TSH: TSH: 1.962 u[IU]/mL (ref 0.350–4.500)

## 2023-04-06 LAB — CBG MONITORING, ED
Glucose-Capillary: 212 mg/dL — ABNORMAL HIGH (ref 70–99)
Glucose-Capillary: 131 mg/dL — ABNORMAL HIGH (ref 70–99)

## 2023-04-06 MED ORDER — HYDRALAZINE HCL 20 MG/ML IJ SOLN: 5.0000 mg | INTRAMUSCULAR | Status: DC | PRN

## 2023-04-06 MED ORDER — INSULIN GLARGINE-YFGN 100 UNIT/ML ~~LOC~~ SOLN
30.0000 [IU] | Freq: Every day | SUBCUTANEOUS | Status: DC
  Administered 2023-04-07 – 2023-04-11 (×5): 30 [IU] via SUBCUTANEOUS
  Filled 2023-04-06 (×5): qty 0.3

## 2023-04-06 MED ORDER — LEVOTHYROXINE SODIUM 50 MCG PO TABS
50.0000 ug | ORAL_TABLET | Freq: Every day | ORAL | Status: DC
  Administered 2023-04-07 – 2023-04-11 (×5): 50 ug via ORAL
  Filled 2023-04-06 (×6): qty 1

## 2023-04-06 MED ORDER — ALBUTEROL SULFATE (2.5 MG/3ML) 0.083% IN NEBU: 3.0000 mL | INHALATION_SOLUTION | RESPIRATORY_TRACT | Status: DC | PRN

## 2023-04-06 MED ORDER — SENNOSIDES-DOCUSATE SODIUM 8.6-50 MG PO TABS: 1.0000 | ORAL_TABLET | Freq: Every evening | ORAL | Status: DC | PRN

## 2023-04-06 MED ORDER — NITROGLYCERIN 0.4 MG SL SUBL: 0.4000 mg | SUBLINGUAL_TABLET | SUBLINGUAL | Status: DC | PRN

## 2023-04-06 MED ORDER — INSULIN ASPART 100 UNIT/ML IJ SOLN
0.0000 [IU] | Freq: Three times a day (TID) | INTRAMUSCULAR | Status: DC
  Administered 2023-04-07: 1 [IU] via SUBCUTANEOUS
  Administered 2023-04-07 (×2): 2 [IU] via SUBCUTANEOUS
  Administered 2023-04-08: 3 [IU] via SUBCUTANEOUS
  Administered 2023-04-08: 1 [IU] via SUBCUTANEOUS
  Administered 2023-04-08 – 2023-04-09 (×2): 3 [IU] via SUBCUTANEOUS
  Administered 2023-04-09: 5 [IU] via SUBCUTANEOUS
  Administered 2023-04-09: 2 [IU] via SUBCUTANEOUS
  Administered 2023-04-10 (×2): 3 [IU] via SUBCUTANEOUS
  Administered 2023-04-10: 1 [IU] via SUBCUTANEOUS
  Administered 2023-04-11: 2 [IU] via SUBCUTANEOUS
  Filled 2023-04-06 (×13): qty 1

## 2023-04-06 MED ORDER — ACETAMINOPHEN 325 MG PO TABS
650.0000 mg | ORAL_TABLET | ORAL | Status: DC | PRN
  Administered 2023-04-09 – 2023-04-11 (×3): 650 mg via ORAL
  Filled 2023-04-06 (×4): qty 2

## 2023-04-06 MED ORDER — PANTOPRAZOLE SODIUM 40 MG PO TBEC
40.0000 mg | DELAYED_RELEASE_TABLET | Freq: Every day | ORAL | Status: DC
  Administered 2023-04-07 – 2023-04-11 (×5): 40 mg via ORAL
  Filled 2023-04-06 (×5): qty 1

## 2023-04-06 MED ORDER — DM-GUAIFENESIN ER 30-600 MG PO TB12: 1.0000 | ORAL_TABLET | Freq: Two times a day (BID) | ORAL | Status: DC | PRN

## 2023-04-06 MED ORDER — STROKE: EARLY STAGES OF RECOVERY BOOK: Freq: Once | Status: AC

## 2023-04-06 MED ORDER — ASPIRIN 81 MG PO CHEW
324.0000 mg | CHEWABLE_TABLET | Freq: Once | ORAL | Status: AC
  Administered 2023-04-06: 324 mg via ORAL
  Filled 2023-04-06: qty 4

## 2023-04-06 MED ORDER — ATORVASTATIN CALCIUM 20 MG PO TABS
80.0000 mg | ORAL_TABLET | Freq: Every day | ORAL | Status: DC
  Administered 2023-04-06 – 2023-04-07 (×2): 80 mg via ORAL
  Filled 2023-04-06 (×2): qty 4

## 2023-04-06 MED ORDER — ACETAMINOPHEN 160 MG/5ML PO SOLN: 650.0000 mg | ORAL | Status: DC | PRN

## 2023-04-06 MED ORDER — ONDANSETRON HCL 4 MG/2ML IJ SOLN: 4.0000 mg | Freq: Three times a day (TID) | INTRAMUSCULAR | Status: DC | PRN

## 2023-04-06 MED ORDER — IOHEXOL 350 MG/ML SOLN
75.0000 mL | Freq: Once | INTRAVENOUS | Status: AC | PRN
  Administered 2023-04-06: 75 mL via INTRAVENOUS

## 2023-04-06 MED ORDER — ENOXAPARIN SODIUM 40 MG/0.4ML IJ SOSY: 40.0000 mg | PREFILLED_SYRINGE | INTRAMUSCULAR | Status: DC

## 2023-04-06 MED ORDER — INSULIN ASPART 100 UNIT/ML IJ SOLN
0.0000 [IU] | Freq: Every day | INTRAMUSCULAR | Status: DC
  Administered 2023-04-09: 2 [IU] via SUBCUTANEOUS
  Filled 2023-04-06: qty 1

## 2023-04-06 MED ORDER — ENOXAPARIN SODIUM 60 MG/0.6ML IJ SOSY
50.0000 mg | PREFILLED_SYRINGE | INTRAMUSCULAR | Status: DC
  Administered 2023-04-06: 50 mg via SUBCUTANEOUS
  Filled 2023-04-06: qty 0.6

## 2023-04-06 MED ORDER — FENOFIBRATE 54 MG PO TABS
54.0000 mg | ORAL_TABLET | Freq: Every day | ORAL | Status: DC
  Administered 2023-04-06 – 2023-04-11 (×6): 54 mg via ORAL
  Filled 2023-04-06 (×6): qty 1

## 2023-04-06 MED ORDER — ACETAMINOPHEN 650 MG RE SUPP: 650.0000 mg | RECTAL | Status: DC | PRN

## 2023-04-06 MED ORDER — MAGNESIUM OXIDE -MG SUPPLEMENT 400 (240 MG) MG PO TABS
200.0000 mg | ORAL_TABLET | Freq: Every day | ORAL | Status: DC
  Administered 2023-04-07 – 2023-04-11 (×5): 200 mg via ORAL
  Filled 2023-04-06 (×6): qty 1

## 2023-04-06 NOTE — ED Notes (Signed)
Patient transported to MRI 

## 2023-04-06 NOTE — ED Notes (Signed)
Patient transported to CT 

## 2023-04-06 NOTE — Progress Notes (Signed)
PHARMACIST - PHYSICIAN COMMUNICATION  CONCERNING:  Enoxaparin (Lovenox) for DVT Prophylaxis    RECOMMENDATION: Patient was prescribed enoxaprin 40mg  q24 hours for VTE prophylaxis.   Filed Weights   04/06/23 1202  Weight: 94.3 kg (208 lb)    Body mass index is 32.58 kg/m.  Estimated Creatinine Clearance: 36.6 mL/min (A) (by C-G formula based on SCr of 1.32 mg/dL (H)).   Based on [facility] policy patient is candidate for enoxaparin 0.5mg /kg TBW SQ every 24 hours based on BMI being >30.   DESCRIPTION: Pharmacy has adjusted enoxaparin dose per [facility] policy.  Patient is now receiving enoxaparin 50 mg every 24 hours    Breylin Dom Rodriguez-Guzman PharmD, BCPS 04/06/2023 6:24 PM

## 2023-04-06 NOTE — H&P (Signed)
History and Physical    Joseph [name]:[id number] DOB: 08-Nov-1925 DOA: 04/06/2023  Referring MD/NP/PA:   PCP: Bosie Clos, MD   Patient coming from:  The patient is coming from home.     Chief Complaint: Weakness and mild left facial droop  HPI: Joseph [name] is a 89 y.o. male with medical history significant of stroke with right-sided weakness 11/2022, HTN, HLD, DM, CAD, CABG, dCHF, hypothyroidism, CKD-3A, who presents with generalized weakness and mild left facial droop.   Patient states he started feeling generalized weak since yesterday morning, denies unilateral numbness or tingling in extremities.  He was noted to have mild left facial droop, being slower in speaking per caregiver at the bedside.  No vision loss.  Patient has chronic hard of hearing, which has not changed.  Denies chest pain, cough, shortness of breath.  No nausea, vomiting, diarrhea or abdominal pain.  No symptoms of UTI.  No fever or chills.  Pt is  found to have new onset atrial fibrillation with HR 40-50s in ED.  Data reviewed independently and ED Course: pt was found to have WBC 6.6, worsening renal function, negative urinalysis, temperature normal, blood pressure 181/66, 135/70, RR 21, oxygen sat 97% on room air.  CT of head negative. MRI showed acute stroke.  Patient is admitted to telemetry bed as inpatient.  Dr. Gala Romney of cardiology and Dr. Delice Lesch of neurology are consulted.  MRI-brain: Acute infarct in the right posterior limb of the internal capsule and corona radiata.  EKG: I have personally reviewed.  Atrial fibrillation, heart rate 62, LAD, poor R wave progression   Review of Systems:   General: no fevers, chills, no body weight gain, has weakness HEENT: no blurry vision, hearing changes or sore throat Respiratory: no dyspnea, coughing, wheezing CV: no chest pain, no palpitations GI: no nausea, vomiting, abdominal pain, diarrhea, constipation GU: no dysuria, burning on  urination, increased urinary frequency, hematuria  Ext: no leg edema Neuro: no unilateral numbness, or tingling, no vision change. Has chronic hearing loss. Has mild left facial droop Skin: no rash, no skin tear. MSK: No muscle spasm, no deformity, no limitation of range of movement in spin Heme: No easy bruising.  Travel history: No recent long distant travel.   Allergy:  Allergies  Allergen Reactions   Farxiga [Dapagliflozin] Rash    Genital rash    Past Medical History:  Diagnosis Date   Acute respiratory failure with hypoxia (HCC) 03/21/2016   CAD (coronary artery disease)    a. s/p CABG;  b. 07/2012 Cath: 3VD including 80 dLCX (med Rx), 4/4 patent grafts.   Chronic diastolic CHF (congestive heart failure) (HCC)    a. 07/2012 Echo: EF 55-60%, no rwma, Gr 1 DD, mild MR;  04/2014 Echo: EF 55-60%, no rwma, mild MR, mildly dil LA.   Diabetes mellitus, type 2 (HCC)    HTN (hypertension)    Hyperlipidemia    Hypothyroidism    Lower extremity edema    a. 03/2014 amlodipine d/c'd.   Spinal stenosis     Past Surgical History:  Procedure Laterality Date   APPENDECTOMY     CARDIAC CATHETERIZATION  07/2012   [facility]/no stents   CHOLECYSTECTOMY     CORNEAL TRANSPLANT     CORONARY ARTERY BYPASS GRAFT  1994   HEMORRHOID SURGERY     LEFT HEART CATH AND CORS/GRAFTS ANGIOGRAPHY N/A 12/17/2019   Procedure: LEFT HEART CATH AND CORS/GRAFTS ANGIOGRAPHY;  Surgeon: Yvonne Kendall, MD;  Location: [facility]  INVASIVE CV LAB;  Service: Cardiovascular;  Laterality: N/A;   PROSTATE BIOPSY     TOE SURGERY     ingrown toe nail    Social History:  reports that he quit smoking about 42 years ago. His smoking use included pipe, cigars, and cigarettes. He has never used smokeless tobacco. He reports that he does not drink alcohol and does not use drugs.  Family History:  Family History  Problem Relation Age of Onset   Heart failure Mother    Heart attack Father    Stroke Brother    Heart failure Maternal  Uncle    Heart failure Maternal Grandfather    Sleep apnea Daughter    Sleep apnea Son    Kidney Stones Son      Prior to Admission medications   Medication Sig Start Date End Date Taking? Authorizing Provider  atorvastatin (LIPITOR) 80 MG tablet Take 1 tablet (80 mg total) by mouth daily. Patient taking differently: Take 80 mg by mouth at bedtime. 02/24/23  Yes Antonieta Iba, MD  clopidogrel (PLAVIX) 75 MG tablet Take 1 tablet (75 mg total) by mouth daily. 02/24/23  Yes Antonieta Iba, MD  fenofibrate 54 MG tablet Take 1 tablet (54 mg total) by mouth daily. 12/13/22 04/06/23 Yes Kirby Crigler, Mir Judie Petit, MD  Infant Care Products [facility]) OINT Apply 1 application  topically daily as needed. Apply to peri area topically every shift for skin protectant   Yes [provider]  insulin aspart (NOVOLOG) 100 UNIT/ML injection Inject 20-25 Units into the skin 3 (three) times daily with meals. 25 units in the morning 20 units at noon 25 units in the evening   Yes [provider]  insulin glargine (LANTUS) 100 UNIT/ML injection Inject 40 Units into the skin daily.   Yes [provider]  levothyroxine (SYNTHROID) 50 MCG tablet TAKE 1 TABLET BY MOUTH ONCE DAILY BEFORE BREAKFAST 12/15/20  Yes Bosie Clos, MD  losartan (COZAAR) 100 MG tablet Take 1 tablet (100 mg total) by mouth daily. 02/24/23  Yes Antonieta Iba, MD  Magnesium 250 MG TABS Take 250 mg by mouth daily. Take when you take your Torsemide.   Yes [provider]  nitroGLYCERIN (NITROSTAT) 0.4 MG SL tablet Place 1 tablet (0.4 mg total) under the tongue every 5 (five) minutes as needed for chest pain. 09/30/21  Yes Bosie Clos, MD  omeprazole (PRILOSEC) 20 MG capsule Take 1 capsule by mouth once daily 08/10/22  Yes Bosie Clos, MD  potassium chloride (KLOR-CON M10) 10 MEQ tablet Take 2 tablets (20 mEq total) by mouth daily. 12/13/22 04/06/23 Yes Kirby Crigler, Mir M, MD  torsemide (DEMADEX) 20 MG  tablet Take 1 tablet (20 mg total) by mouth daily. 02/24/23  Yes Furth, Cadence H, PA-C  hydrALAZINE (APRESOLINE) 50 MG tablet Take 1 tablet (50 mg total) by mouth 2 (two) times daily. Patient not taking: Reported on 04/06/2023 01/13/23   Medina-Vargas, Monina C, NP  oxymetazoline (AFRIN) 0.05 % nasal spray Place 2 sprays into both nostrils once for nose bleed, seek medical attention if bleeding persists after 2 sprays, do not repeat dose Patient not taking: Reported on 04/06/2023 02/01/23   Ronnald Ramp, MD    Physical Exam: Vitals:   04/06/23 1400 04/06/23 1430 04/06/23 1731 04/06/23 1800  BP: (!) 172/51 135/70 (!) 173/71 (!) 190/91  Pulse: (!) 59 (!) 45 67   Resp: 16 18 20 19   Temp:      TempSrc:  SpO2:   99%   Weight:      Height:       General: Not in acute distress HEENT:       Eyes: PERRL, EOMI, no scleral icterus.       ENT: No discharge from the ears and nose, no pharynx injection, no tonsillar enlargement.        Neck: No JVD, no bruit, no mass felt. Heme: No neck lymph node enlargement. Cardiac: S1/S2, RRR, No murmurs, No gallops or rubs. Respiratory: No rales, wheezing, rhonchi or rubs. GI: Soft, nondistended, nontender, no rebound pain, no organomegaly, BS present. GU: No hematuria Ext: No pitting leg edema bilaterally. 1+DP/PT pulse bilaterally. Musculoskeletal: No joint deformities, No joint redness or warmth, no limitation of ROM in spin. Skin: No rashes.  Neuro: Alert, oriented X3, cranial nerves II-XII grossly intact except for mild left facial droop.  On my examination, patient muscle strength is normal in all extremities, 5/5. Sensation to light touch intact.  Psych: Patient is not psychotic, no suicidal or hemocidal ideation.  Labs on Admission: I have personally reviewed following labs and imaging studies  CBC: Recent Labs  Lab 04/06/23 1206  WBC 6.6  HGB 12.9*  HCT 38.7*  MCV 90.8  PLT 256   Basic Metabolic Panel: Recent Labs  Lab  04/06/23 1206  NA 137  K 3.6  CL 105  CO2 24  GLUCOSE 240*  BUN 19  CREATININE 1.32*  CALCIUM 8.7*   GFR: Estimated Creatinine Clearance: 36.6 mL/min (A) (by C-G formula based on SCr of 1.32 mg/dL (H)). Liver Function Tests: Recent Labs  Lab 04/06/23 1206  AST 30  ALT 15  ALKPHOS 39  BILITOT 1.0  PROT 6.3*  ALBUMIN 3.3*   No results for input(s): "LIPASE", "AMYLASE" in the last 168 hours. No results for input(s): "AMMONIA" in the last 168 hours. Coagulation Profile: No results for input(s): "INR", "PROTIME" in the last 168 hours. Cardiac Enzymes: No results for input(s): "CKTOTAL", "CKMB", "CKMBINDEX", "TROPONINI" in the last 168 hours. BNP (last 3 results) No results for input(s): "PROBNP" in the last 8760 hours. HbA1C: No results for input(s): "HGBA1C" in the last 72 hours. CBG: Recent Labs  Lab 04/06/23 1204 04/06/23 1824  GLUCAP 212* 131*   Lipid Profile: No results for input(s): "CHOL", "HDL", "LDLCALC", "TRIG", "CHOLHDL", "LDLDIRECT" in the last 72 hours. Thyroid Function Tests: No results for input(s): "TSH", "T4TOTAL", "FREET4", "T3FREE", "THYROIDAB" in the last 72 hours. Anemia Panel: No results for input(s): "VITAMINB12", "FOLATE", "FERRITIN", "TIBC", "IRON", "RETICCTPCT" in the last 72 hours. Urine analysis:    Component Value Date/Time   COLORURINE YELLOW (A) 04/06/2023 1206   APPEARANCEUR CLEAR (A) 04/06/2023 1206   LABSPEC 1.018 04/06/2023 1206   PHURINE 5.0 04/06/2023 1206   GLUCOSEU >=500 (A) 04/06/2023 1206   HGBUR NEGATIVE 04/06/2023 1206   BILIRUBINUR NEGATIVE 04/06/2023 1206   BILIRUBINUR negative 02/10/2020 1600   KETONESUR NEGATIVE 04/06/2023 1206   PROTEINUR NEGATIVE 04/06/2023 1206   UROBILINOGEN 0.2 02/10/2020 1600   NITRITE NEGATIVE 04/06/2023 1206   LEUKOCYTESUR NEGATIVE 04/06/2023 1206   Sepsis Labs: @LABRCNTIP (procalcitonin:4,lacticidven:4) )No results found for this or any previous visit (from the past 240 hour(s)).    Radiological Exams on Admission: MR BRAIN WO CONTRAST  Result Date: 04/06/2023 CLINICAL DATA:  Generalized weakness, stroke suspected EXAM: MRI HEAD WITHOUT CONTRAST TECHNIQUE: Multiplanar, multiecho pulse sequences of the brain and surrounding structures were obtained without intravenous contrast. COMPARISON:  12/09/2022 MRI head, correlation is also made  with same day CT head FINDINGS: Brain: Restricted diffusion with ADC correlate in the right posterior limb of the internal capsule and corona radiata (series 8, images 26), which measures up to 15 x 6 x 9 mm (AP x TR x CC) this is associated with increased T2 hyperintense signal. No acute hemorrhage, mass, mass effect, or midline shift. No hydrocephalus or extra-axial collection. Normal craniocervical junction. No hemosiderin deposition to suggest remote hemorrhage. Sequela of prior infarct in the left posterior limb of the internal capsule and corona radiata. Vascular: Normal arterial flow voids. Skull and upper cervical spine: Normal marrow signal. Sinuses/Orbits: Clear paranasal sinuses. No acute finding in the orbits. Status post bilateral lens replacements. Other: Trace fluid in right mastoid air cells. IMPRESSION: Acute infarct in the right posterior limb of the internal capsule and corona radiata. These results were called by telephone at the time of interpretation on 04/06/2023 at 4:39 pm to provider [facility] , who verbally acknowledged these results. Electronically Signed   By: Wiliam Ke M.D.   On: 04/06/2023 16:42   CT HEAD WO CONTRAST ( )  Result Date: 04/06/2023 CLINICAL DATA:  Neuro deficit, acute, stroke suspected. Additional history provided: Weakness, prior stroke. EXAM: CT HEAD WITHOUT CONTRAST TECHNIQUE: Contiguous axial images were obtained from the base of the skull through the vertex without intravenous contrast. RADIATION DOSE REDUCTION: This exam was performed according to the departmental dose-optimization program which  includes automated exposure control, adjustment of the mA and/or kV according to patient size and/or use of iterative reconstruction technique. COMPARISON:  Non-contrast head CT 12/10/2022. Brain MRI 12/09/2022. FINDINGS: Brain: Known chronic infarct within the left corona radiata/posterior limb of left internal capsule. There is no acute intracranial hemorrhage. No demarcated cortical infarct. No extra-axial fluid collection. No evidence of an intracranial mass. No midline shift. Vascular: No hyperdense vessel. Atherosclerotic calcifications. Skull: No fracture or aggressive osseous lesion. Sinuses/Orbits: No mass or acute finding within the imaged orbits. No significant paranasal sinus disease at the imaged levels. IMPRESSION: 1.  No evidence of an acute intracranial abnormality. 2. 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 Electronically Signed   By: Jackey Loge D.O.   On: 04/06/2023 13:09      Assessment/Plan Principal Problem:   Stroke [facility]) Active Problems:   New onset atrial fibrillation (HCC)   Essential hypertension   Coronary artery disease   Type II diabetes mellitus with renal manifestations (HCC)   Chronic kidney disease, stage 3a (HCC)   Mixed hyperlipidemia   Hypothyroidism   COPD (chronic obstructive pulmonary disease) (HCC)   Chronic diastolic CHF (congestive heart failure) (HCC)   Obesity (BMI 30-39.9)   Assessment and Plan:   Stroke [facility]): Patient has history of stroke 1/24, he had mild right-sided weakness, which which seems to have resolved.  Now presents with mild left facial droop and generalized weakness.  MRI of brain showed acute infarct in the right posterior limb of the internal capsule and corona radiata.  Pt is found to have new onset A fib with slow heart rate. Consulted Dr. Wilford Corner of neurology, who recommended full stroke workup.  -Admit to tele med bed as inpatient - CTA of  head and neck - will hold oral  Bp meds to allow permissive HTN in the setting of acute stroke  - pt was given 324 mg of ASA in ED. Will hold plavix. Pt will likely need Eliquis due to A fib - Statin: Lipitor - fasting lipid panel  and HbA1c  - 2D transthoracic echocardiography  - swallowing screen. If fails, will get SLP - PT/OT consult  New onset atrial fibrillation [facility]): CHA2DS2-VASc Score is 7, need oral anticoagulation, but will not start [facility] now due to risk of hemorrhagic conversion of stroke.  Heart rate is slow in 40-50s. -Consulted Dr. Madelynn Done for cardiology -check TSH  Hyperlipidemia, mixed -Lipitor and fenofibrate  Essential hypertension -hold home blood pressure medications to allow permissive hypertension - prn IV hydralazine for SBP>220 or dBP>110  Coronary artery disease: s/p of CABG. No chest pain -Lipitor and ASA  Type II diabetes mellitus with renal manifestations [facility]):: Recent A1c 7.4, poorly controlled.  Patient is taking NovoLog and Lantus 40 units daily -SSI -Glargine insulin 30 units daily  Chronic kidney disease, stage 3a (HCC): Renal function is worsening.  Recent baseline creatinine 1.08 on 12/14/2022.  His creatinine is 1.32, BUN 19, GFR 50 -Cozaar is on hold -Will hold torsemide  Hypothyroidism -Hypothyroidism  COPD (chronic obstructive pulmonary disease) (HCC): Stable -As needed bronchodilators  Chronic diastolic CHF (congestive heart failure) (HCC): 2D echo 12/11/2022 showed EF of 55 to 60% with grade 1 diastolic dysfunction.  Patient has trace leg edema, but no JVD, no shortness of breath.  CHF seem to be compensated. -Hold torsemide due to worsening renal function -Check BNP  Obesity (BMI 30-39.9): Body weight 94.3 kg, BMI 32.58 -Healthy diet and exercise -Encourage losing weight     DVT ppx: SQ Lovenox  Code Status: Full code per pt  Family Communication:   Yes, patient's son and caregiver at bed side.      Disposition Plan:  Anticipate discharge back to previous  environment  Consults called:  Dr. Gala Romney of cardiology and Dr. Delice Lesch of neurology are consulted.  Admission status and Level of care: Telemetry Medical:   as inpt      Dispo: The patient is from: Home              Anticipated d/c is to: Home              Anticipated d/c date is: 2 days              Patient currently is not medically stable to d/c.    Severity of Illness:  The appropriate patient status for this patient is INPATIENT. Inpatient status is judged to be reasonable and necessary in order to provide the required intensity of service to ensure the patient's safety. The patient's presenting symptoms, physical exam findings, and initial radiographic and laboratory data in the context of their chronic comorbidities is felt to place them at high risk for further clinical deterioration. Furthermore, it is not anticipated that the patient will be medically stable for discharge from the hospital within 2 midnights of admission.   * I certify that at the point of admission it is my clinical judgment that the patient will require inpatient hospital care spanning beyond 2 midnights from the point of admission due to high intensity of service, high risk for further deterioration and high frequency of surveillance required.*       Date of Service 04/06/2023    Lorretta Harp [facility]   If 7PM-7AM, please contact night-coverage [email or web address] 04/06/2023, 6:41 PM

## 2023-04-06 NOTE — ED Provider Notes (Signed)
[facility] Provider Note    Event Date/Time   First MD Initiated Contact with Patient 04/06/23 1211     (approximate)  History   Chief Complaint: Weakness  HPI  KENARD BOEDER is a 89 y.o. male with a past medical history of CAD, CHF, diabetes, hypertension, hyperlipidemia, recent CVA with right-sided deficits who presents to the emergency department for generalized weakness.  According to the patient earlier this year he suffered a CVA with right-sided deficits.  States he has been recovering ever since.  He is now able to ambulate with a walker however over the past 3 days patient states he has been feeling weak and he has been unable to ambulate with a walker and has trouble even getting up from his chair.  He states people were concerned that he could have had another stroke so he came to the emergency department for evaluation.  Patient denies any recent cough congestion fever vomiting diarrhea or dysuria.  Denies any focal weakness states a sensation of generalized fatigue.  Physical Exam   Triage Vital Signs: ED Triage Vitals  Enc Vitals Group     BP 04/06/23 1205 (!) 156/80     Pulse Rate 04/06/23 1205 (!) 58     Resp 04/06/23 1205 (!) 21     Temp 04/06/23 1205 97.6 F (36.4 C)     Temp Source 04/06/23 1205 Oral     SpO2 04/06/23 1205 97 %     Weight 04/06/23 1202 208 lb (94.3 kg)     Height 04/06/23 1202 5\' 7"  (1.702 m)     Head Circumference --      Peak Flow --      Pain Score 04/06/23 1202 0     Pain Loc --      Pain Edu? --      Excl. in GC? --     Most recent vital signs: Vitals:   04/06/23 1205  BP: (!) 156/80  Pulse: (!) 58  Resp: (!) 21  Temp: 97.6 F (36.4 C)  SpO2: 97%    General: Awake, no distress.  CV:  Good peripheral perfusion.  Regular rate and rhythm  Resp:  Normal effort.  Equal breath sounds bilaterally.  Abd:  No distention.  Soft, nontender.  No rebound or guarding.  ED Results / Procedures / Treatments    EKG  EKG viewed and interpreted by myself shows a atrial fibrillation versus sinus rhythm at 62 bpm, widened QRS, normal axis, normal intervals without concerning ST findings.  Overall large unchanged from prior EKGs especially of 08/29/2022  RADIOLOGY  I reviewed and interpreted the CT images I do not see any obvious bleed seen on my evaluation. Radiology has read the CT is negative for acute abnormality.   MEDICATIONS ORDERED IN ED: Medications - No data to display   IMPRESSION / MDM / ASSESSMENT AND PLAN / ED COURSE  I reviewed the triage vital signs and the nursing notes.  Patient's presentation is most consistent with acute presentation with potential threat to life or bodily function.  Patient presents emergency department for generalized weakness and difficulty ambulating and standing up from his chair over the last 3 to 4 days.  History of prior CVA with right-sided deficits but states he has been recovering well and was able to now ambulate and stand on his own using a walker.  Has not been able to do so over the last 3 days.  No concerning finding on  physical exam appears to have equal grip strength I did not appreciate a pronator drift on either arm and no lower extremity drift, 5/5 motor in all extremities, no obvious cranial nerve deficit.  We will check labs to evaluate for metabolic or electrolyte abnormality or anemia.  Will obtain CT scan of the head to help rule out intracranial hemorrhage or significant abnormality.  Will obtain cardiac enzymes to rule out ACS.  EKG shows sinus rhythm versus A-fib difficult to decipher P waves however largely unchanged from his prior EKG in October when she had more of a sinus arrhythmia with a similar morphology.  Urinalysis pending to rule out urinary tract infection.  CT scan of the head shows no acute abnormality.  Patient's urinalysis shows no sign of urinary tract infection and troponin reassuringly negative, CBC is normal, chemistry  reassuring, very mild AKI.  Will dose IV fluids we will obtain an MRI of the brain to rule out CVA.  Patient and daughter are agreeable to plan of care.  MRI is pending, patient care signed out to oncoming provider.  FINAL CLINICAL IMPRESSION(S) / ED DIAGNOSES   Weakness    Note:  This document was prepared using Dragon voice recognition software and may include unintentional dictation errors.   Minna Antis, MD 04/06/23 1440

## 2023-04-06 NOTE — ED Provider Notes (Signed)
Patient signed out to me pending MRI results.  MRI shows acute infarct in the right posterior limb of the internal capsule.  On my assessment patient does have mild left-sided facial droop.  He is on Plavix currently.  Had been on aspirin and Plavix for 21 days after his prior stroke in January.  Will give full dose aspirin.  Will consult the hospitalist for admission.   Georga Hacking, MD 04/06/23 [phone]

## 2023-04-06 NOTE — ED Notes (Signed)
Pt ambulatory with walker from bed to toilet without assistance.

## 2023-04-06 NOTE — ED Triage Notes (Signed)
Pt presents to the ED from home via [facility] for weakness. Pt had a stroke last January and was concerned about another stroke. Stroke screen was negative with EMS. Pt states that he started feeling weak all over on Monday. BG at time of triage 212  Vital signs with EMS as follows HR 70 CBG 249 BP 150/100 96% RA

## 2023-04-07 ENCOUNTER — Telehealth: Payer: Self-pay | Admitting: Cardiovascular Disease

## 2023-04-07 ENCOUNTER — Inpatient Hospital Stay (HOSPITAL_COMMUNITY)
Admit: 2023-04-07 | Discharge: 2023-04-07 | Disposition: A | Discharge status: Home / Self Care | Payer: PPO | Attending: Internal Medicine | Admitting: Internal Medicine

## 2023-04-07 ENCOUNTER — Telehealth: Payer: Self-pay

## 2023-04-07 ENCOUNTER — Other Ambulatory Visit: Payer: Self-pay

## 2023-04-07 ENCOUNTER — Other Ambulatory Visit (HOSPITAL_COMMUNITY): Payer: Self-pay

## 2023-04-07 ENCOUNTER — Inpatient Hospital Stay: Payer: PPO

## 2023-04-07 DIAGNOSIS — N183 Chronic kidney disease, stage 3 unspecified: Secondary | ICD-10-CM

## 2023-04-07 DIAGNOSIS — E785 Hyperlipidemia, unspecified: Secondary | ICD-10-CM

## 2023-04-07 DIAGNOSIS — I639 Cerebral infarction, unspecified: Secondary | ICD-10-CM | POA: Diagnosis not present

## 2023-04-07 DIAGNOSIS — R531 Weakness: Secondary | ICD-10-CM

## 2023-04-07 DIAGNOSIS — I4891 Unspecified atrial fibrillation: Secondary | ICD-10-CM

## 2023-04-07 DIAGNOSIS — I6389 Other cerebral infarction: Secondary | ICD-10-CM | POA: Diagnosis not present

## 2023-04-07 DIAGNOSIS — E1129 Type 2 diabetes mellitus with other diabetic kidney complication: Secondary | ICD-10-CM

## 2023-04-07 DIAGNOSIS — I4819 Other persistent atrial fibrillation: Secondary | ICD-10-CM | POA: Diagnosis not present

## 2023-04-07 DIAGNOSIS — I25708 Atherosclerosis of coronary artery bypass graft(s), unspecified, with other forms of angina pectoris: Secondary | ICD-10-CM | POA: Diagnosis not present

## 2023-04-07 DIAGNOSIS — Z794 Long term (current) use of insulin: Secondary | ICD-10-CM

## 2023-04-07 DIAGNOSIS — I10 Essential (primary) hypertension: Secondary | ICD-10-CM | POA: Diagnosis not present

## 2023-04-07 LAB — BASIC METABOLIC PANEL
Anion gap: 6 (ref 5–15)
BUN: 16 mg/dL (ref 8–23)
CO2: 23 mmol/L (ref 22–32)
Calcium: 8.7 mg/dL — ABNORMAL LOW (ref 8.9–10.3)
Chloride: 110 mmol/L (ref 98–111)
Creatinine, Ser: 1.09 mg/dL (ref 0.61–1.24)
GFR, Estimated: >60 mL/min (ref >60)
Glucose, Bld: 168 mg/dL — ABNORMAL HIGH (ref 70–99)
Potassium: 3.6 mmol/L (ref 3.5–5.1)
Sodium: 139 mmol/L (ref 135–145)

## 2023-04-07 LAB — LIPID PANEL
Cholesterol: 126 mg/dL (ref 0–200)
HDL: 28 mg/dL — ABNORMAL LOW (ref >40)
LDL Cholesterol: 70 mg/dL (ref 0–99)
Total CHOL/HDL Ratio: 4.5 RATIO
Triglycerides: 138 mg/dL (ref <150)
VLDL: 28 mg/dL (ref 0–40)

## 2023-04-07 LAB — CBC WITH DIFFERENTIAL/PLATELET
Abs Immature Granulocytes: 0.01 10*3/uL (ref 0.00–0.07)
Basophils Absolute: 0.1 10*3/uL (ref 0.0–0.1)
Basophils Relative: 2 %
Eosinophils Absolute: 0.3 10*3/uL (ref 0.0–0.5)
Eosinophils Relative: 6 %
HCT: 38.4 % — ABNORMAL LOW (ref 39.0–52.0)
Hemoglobin: 12.8 g/dL — ABNORMAL LOW (ref 13.0–17.0)
Immature Granulocytes: 0 %
Lymphocytes Relative: 40 %
Lymphs Abs: 1.9 10*3/uL (ref 0.7–4.0)
MCH: 29.8 pg (ref 26.0–34.0)
MCHC: 33.3 g/dL (ref 30.0–36.0)
MCV: 89.3 fL (ref 80.0–100.0)
Monocytes Absolute: 0.5 10*3/uL (ref 0.1–1.0)
Monocytes Relative: 10 %
Neutro Abs: 2 10*3/uL (ref 1.7–7.7)
Neutrophils Relative %: 42 %
Platelets: 237 10*3/uL (ref 150–400)
RBC: 4.3 MIL/uL (ref 4.22–5.81)
RDW: 13.9 % (ref 11.5–15.5)
WBC: 4.8 10*3/uL (ref 4.0–10.5)
nRBC: 0 % (ref 0.0–0.2)

## 2023-04-07 LAB — ECHOCARDIOGRAM COMPLETE
AR max vel: 1.78 cm2
AV Area VTI: 1.61 cm2
AV Area mean vel: 1.62 cm2
AV Mean grad: 8 mmHg
AV Peak grad: 16.3 mmHg
Ao pk vel: 2.02 m/s
Area-P 1/2: 2.83 cm2
Height: 67 in
MV VTI: 2.48 cm2
S' Lateral: 3.4 cm
Weight: 3328 oz

## 2023-04-07 LAB — HEMOGLOBIN A1C
Hgb A1c MFr Bld: 6.9 % — ABNORMAL HIGH (ref 4.8–5.6)
Mean Plasma Glucose: 151.33 mg/dL

## 2023-04-07 LAB — GLUCOSE, CAPILLARY
Glucose-Capillary: 139 mg/dL — ABNORMAL HIGH (ref 70–99)
Glucose-Capillary: 163 mg/dL — ABNORMAL HIGH (ref 70–99)
Glucose-Capillary: 193 mg/dL — ABNORMAL HIGH (ref 70–99)
Glucose-Capillary: 149 mg/dL — ABNORMAL HIGH (ref 70–99)

## 2023-04-07 MED ORDER — MELATONIN 5 MG PO TABS
5.0000 mg | ORAL_TABLET | Freq: Every day | ORAL | Status: DC
  Administered 2023-04-07 – 2023-04-10 (×3): 5 mg via ORAL
  Filled 2023-04-07 (×3): qty 1

## 2023-04-07 MED ORDER — APIXABAN 5 MG PO TABS
5.0000 mg | ORAL_TABLET | Freq: Two times a day (BID) | ORAL | Status: DC
  Administered 2023-04-08 – 2023-04-11 (×7): 5 mg via ORAL
  Filled 2023-04-07 (×7): qty 1

## 2023-04-07 MED ORDER — PERFLUTREN LIPID MICROSPHERE
1.0000 mL | INTRAVENOUS | Status: AC | PRN
  Administered 2023-04-07: 3 mL via INTRAVENOUS

## 2023-04-07 MED ORDER — APIXABAN 5 MG PO TABS: 5.0000 mg | ORAL_TABLET | Freq: Two times a day (BID) | ORAL | Status: DC

## 2023-04-07 NOTE — TOC Benefit Eligibility Note (Signed)
Patient Product/process development scientist completed.    The patient is currently admitted and upon discharge could be taking Eliquis.  The current 30 day co-pay is $47.00.   The patient is insured through [employer] Part D   This test claim was processed through [facility]- copay amounts may vary at other pharmacies due to pharmacy/plan contracts, or as the patient moves through the different stages of their insurance plan.

## 2023-04-07 NOTE — Progress Notes (Signed)
Patient received from CT  via bed in stable condition. 

## 2023-04-07 NOTE — Consult Note (Addendum)
Neurology Consultation  Reason for Consult: Stroke Referring Physician: Dr. Clyde Lundborg  CC: Weakness  History is obtained from: Patient, chart  HPI: Joseph KAWCZYNSKI is a 89 y.o. male who has a past medical history of diabetes, hypertension, hyperlipidemia, hypothyroidism, spinal stenosis, chronic diastolic heart failure, coronary artery disease, left basal ganglia infarct with some residual right-sided weakness for which she is getting therapy, presented to the emergency room for evaluation of multiple days of weakness and family noticing left-sided facial droop-unclear last known well. MRI was done which shows a new stroke in the right internal capsule/corona radiata. Also heart rate was noted to be in 40s to 50s and rhythm was atrial fibrillation-this is a new finding. He follows with Dr. Mariah Milling, cardiology. I was contacted over the phone yesterday and recommended that a full stroke workup be done and eventually he will need anticoagulation.  He and the family are very concerned about the anticoagulation for multiple reasons: 1.  He gets nosebleeds even without being on anticoagulation every morning that requires multiple tissue/napkins to control and he is concerned that if he goes on blood thinning that would get worse. 2.  His wife was on Eliquis and she passed-he did not share much details but is reluctant for that reason as well. Family would like to speak with cardiology prior to considering anticoagulation   LKW: Unclear IV thrombolysis given?: no, unclear last known well Premorbid modified Rankin scale (mRS):3-uses a walker, multiple people come to help at home but lives at home independently.  Stays alone at night but has help during most of the daytime.  ROS: Full ROS was performed and is negative except as noted in the HPI  Past Medical History:  Diagnosis Date   Acute respiratory failure with hypoxia (HCC) 03/21/2016   CAD (coronary artery disease)    a. s/p CABG;  b. 07/2012 Cath:  3VD including 80 dLCX (med Rx), 4/4 patent grafts.   Chronic diastolic CHF (congestive heart failure) (HCC)    a. 07/2012 Echo: EF 55-60%, no rwma, Gr 1 DD, mild MR;  04/2014 Echo: EF 55-60%, no rwma, mild MR, mildly dil LA.   Diabetes mellitus, type 2 (HCC)    HTN (hypertension)    Hyperlipidemia    Hypothyroidism    Lower extremity edema    a. 03/2014 amlodipine d/c'd.   Spinal stenosis      Family History  Problem Relation Age of Onset   Heart failure Mother    Heart attack Father    Stroke Brother    Heart failure Maternal Uncle    Heart failure Maternal Grandfather    Sleep apnea Daughter    Sleep apnea Son    Kidney Stones Son      Social History:   reports that he quit smoking about 42 years ago. His smoking use included pipe, cigars, and cigarettes. He has never used smokeless tobacco. He reports that he does not drink alcohol and does not use drugs. Medications  Current Facility-Administered Medications:     stroke: early stages of recovery book, , Does not apply, Once, Lorretta Harp, MD   acetaminophen (TYLENOL) tablet 650 mg, 650 mg, Oral, Q4H PRN **OR** acetaminophen (TYLENOL) 160 MG/5ML solution 650 mg, 650 mg, Per Tube, Q4H PRN **OR** acetaminophen (TYLENOL) suppository 650 mg, 650 mg, Rectal, Q4H PRN, Lorretta Harp, MD   albuterol (PROVENTIL) (2.5 MG/3ML) 0.083% nebulizer solution 3 mL, 3 mL, Nebulization, Q4H PRN, Lorretta Harp, MD   atorvastatin (LIPITOR) tablet 80 mg, 80  mg, Oral, Daily, Lorretta Harp, MD, 80 mg at 04/07/23 1610   dextromethorphan-guaiFENesin (MUCINEX DM) 30-600 MG per 12 hr tablet 1 tablet, 1 tablet, Oral, BID PRN, Lorretta Harp, MD   enoxaparin (LOVENOX) injection 50 mg, 50 mg, Subcutaneous, Q24H, Lorretta Harp, MD, 50 mg at 04/06/23 2207   fenofibrate tablet 54 mg, 54 mg, Oral, Daily, Lorretta Harp, MD, 54 mg at 04/07/23 9604   hydrALAZINE (APRESOLINE) injection 5 mg, 5 mg, Intravenous, Q2H PRN, Lorretta Harp, MD   insulin aspart (novoLOG) injection 0-5 Units, 0-5  Units, Subcutaneous, QHS, Lorretta Harp, MD   insulin aspart (novoLOG) injection 0-9 Units, 0-9 Units, Subcutaneous, TID WC, Lorretta Harp, MD   insulin glargine-yfgn (SEMGLEE) injection 30 Units, 30 Units, Subcutaneous, Daily, Lorretta Harp, MD   levothyroxine (SYNTHROID) tablet 50 mcg, 50 mcg, Oral, QAC breakfast, Lorretta Harp, MD, 50 mcg at 04/07/23 5409   magnesium oxide (MAG-OX) tablet 200 mg, 200 mg, Oral, Daily, Lorretta Harp, MD, 200 mg at 04/07/23 0825   nitroGLYCERIN (NITROSTAT) SL tablet 0.4 mg, 0.4 mg, Sublingual, Q5 min PRN, Lorretta Harp, MD   ondansetron [facility]) injection 4 mg, 4 mg, Intravenous, Q8H PRN, Lorretta Harp, MD   pantoprazole (PROTONIX) EC tablet 40 mg, 40 mg, Oral, Daily, Lorretta Harp, MD, 40 mg at 04/07/23 0825   senna-docusate (Senokot-S) tablet 1 tablet, 1 tablet, Oral, QHS PRN, Lorretta Harp, MD   Exam: Current vital signs: BP (!) 160/78 (BP Location: Left Arm)   Pulse 71   Temp 97.9 F (36.6 C)   Resp 18   Ht 5\' 7"  (1.702 m)   Wt 94.3 kg   SpO2 98%   BMI 32.58 kg/m  Vital signs in last 24 hours: Temp:  [97.6 F (36.4 C)-98.8 F (37.1 C)] 97.9 F (36.6 C) (05/17 0843) Pulse Rate:  [44-71] 71 (05/17 0843) Resp:  [15-21] 18 (05/17 0843) BP: (135-190)/(51-91) 160/78 (05/17 0843) SpO2:  [96 %-99 %] 98 % (05/17 0843) Weight:  [94.3 kg] 94.3 kg (05/16 1202) General: Awake alert in no distress HEENT: Normocephalic, atraumatic Lungs: Clear Cardiovascular: Regular rhythm Abdomen nondistended nontender Extremities warm well-perfused Neurological exam Awake alert oriented x 3 Mildly dysarthric speech No evidence of aphasia Cranial nerves II through XII: Pupils equal round react light, extraocular movements intact, visual fields full, face is mildly asymmetric with left lower facial subtle droop visible at rest.  He is able to smile symmetrically.  Tongue and palate midline. Motor examination with no drift in any of the 4 extremities but has subtle weakness of the right upper  and lower extremities which is worse than the left. Sensation intact light touch without extinction Coordination examination with mild dysmetria on the right  NIHSS 1a Level of Conscious.: 0 1b LOC Questions: 0 1c LOC Commands: 0 2 Best Gaze: 0 3 Visual: 0 4 Facial Palsy: 1 5a Motor Arm - left: 0 5b Motor Arm - Right: 0 6a Motor Leg - Left: 0 6b Motor Leg - Right: 0 7 Limb Ataxia: 1 8 Sensory: 0 9 Best Language: 0 10 Dysarthria: 1 11 Extinct. and Inatten.: 0 TOTAL: 3   Labs I have reviewed labs in epic and the results pertinent to this consultation are: CBC    Component Value Date/Time   WBC 6.6 04/06/2023 1206   RBC 4.26 04/06/2023 1206   HGB 12.9 (L) 04/06/2023 1206   HGB 13.8 05/11/2020 1013   HCT 38.7 (L) 04/06/2023 1206   HCT 40.6 05/11/2020 1013   PLT 256 04/06/2023  1206   PLT 222 05/11/2020 1013   MCV 90.8 04/06/2023 1206   MCV 89 05/11/2020 1013   MCH 30.3 04/06/2023 1206   MCHC 33.3 04/06/2023 1206   RDW 14.1 04/06/2023 1206   RDW 13.3 05/11/2020 1013   LYMPHSABS 2.6 12/16/2019 1338   LYMPHSABS 2.6 05/14/2018 0824   MONOABS 0.6 12/16/2019 1338   EOSABS 1.1 (H) 12/16/2019 1338   EOSABS 0.6 (H) 05/14/2018 0824   BASOSABS 0.1 12/16/2019 1338   BASOSABS 0.1 05/14/2018 0824    CMP     Component Value Date/Time   NA 137 04/06/2023 1206   NA 139 06/01/2022 1532   K 3.6 04/06/2023 1206   CL 105 04/06/2023 1206   CO2 24 04/06/2023 1206   GLUCOSE 240 (H) 04/06/2023 1206   BUN 19 04/06/2023 1206   BUN 20 06/01/2022 1532   CREATININE 1.32 (H) 04/06/2023 1206   CALCIUM 8.7 (L) 04/06/2023 1206   PROT 6.3 (L) 04/06/2023 1206   PROT 6.0 05/14/2018 0824   ALBUMIN 3.3 (L) 04/06/2023 1206   ALBUMIN 3.9 06/01/2022 1532   AST 30 04/06/2023 1206   ALT 15 04/06/2023 1206   ALKPHOS 39 04/06/2023 1206   BILITOT 1.0 04/06/2023 1206   BILITOT 0.6 05/14/2018 0824   GFRNONAA 50 (L) 04/06/2023 1206   GFRAA >60 06/22/2020 1138    Lipid Panel     Component  Value Date/Time   CHOL 126 04/07/2023 0410   CHOL 122 05/14/2018 0824   TRIG 138 04/07/2023 0410   HDL 28 (L) 04/07/2023 0410   HDL 34 (L) 05/14/2018 0824   CHOLHDL 4.5 04/07/2023 0410   VLDL 28 04/07/2023 0410   LDLCALC 70 04/07/2023 0410   LDLCALC 55 05/14/2018 0824   Hemoglobin A1c-7.4 from January  2D echo from this admission pending 2D echocardiogram from January of this year showed an ejection fraction of 55 to 60% with grade 1 diastolic dysfunction.  Normal mitral valve.  Normal aortic valve.  Normal left atrial size.  Imaging I have reviewed the images obtained:  CT-head no acute intracranial process CT angio head and neck negative for emergent large vessel occlusion.  Moderate atheromatous changes about carotid siphons with associated mild to moderate stenosis left worse than right.  Additional iCAD with moderate right P2 stenosis.  Diffuse small vessel atheromatous irregularity.  Atheromatous plaque at the origin of the right vertebral artery with associated severe stenosis.  MRI examination of the brain: Acute infarct in the posterior limb of the right internal capsule/right corona radiata.  Assessment:  89 year old man with new onset atrial fibrillation presenting for evaluation of left-sided facial droop and generalized weakness, imaging reveals acute infarct in the posterior limb of the right internal capsule/right corona radiata.  Had a stroke in January that affected the left internal capsule/basal ganglia with some residual right hemiparesis. New onset atrial fibrillation in the ED. Discussed anticoagulation-has reluctance based on nosebleeds at baseline as well as poor experience with anticoagulation in the case of his wife who is now deceased. Wishes to speak with cardiology prior to considering anticoagulation.   Impression: Acute ischemic stroke-location concerning for small vessel etiology but has newfound atrial fibrillation-cardioembolic etiology cannot be  completely ruled out.  Recommendations: Continue telemetry Frequent neurochecks Aspirin for today. Wishes to confer with his cardiologist prior to starting anticoagulation which I would recommend for stroke prevention given new onset atrial fibrillation and strokes. I would recommend Eliquis but again he wishes to discuss this with his cardiologist-will defer  to cardiology. If he decides not to go for anticoagulation, I would recommend dual antiplatelets with aspirin and Plavix for 3 months followed by aspirin only in that situation. A1c goal less than 7-management per primary team. His LDL is 70-at goal.  He was not on statin at home.  With his LDL being at goal without a statin and his advanced age, okay to continue without statin at this time. PT. OT and  Speech therapy assessments Outpatient neurology follow-up in 8 to 12 weeks-[place] [facility] stroke clinic or if prefers to follow in [facility] clinic neurology.  Plan discussed with the patient and his caretaker at the bedside. Primary hospitalist is checking with cardiology regarding anticoagulation questions from the patient above. Plan relayed to Dr. Meriam Sprague via secure chat Please call with questions.   Addendum Patient amenable to anticoagulation as a bridge to Watchman device per Dr. Mariah Milling. Okay to start Eliquis tomorrow-5 mg twice daily. Patient to follow-up with outpatient neurology as above and cardiology. Appreciate Dr. Windell Hummingbird prompt response.Marland Kitchen Please call with questions as needed.  -- Milon Dikes, MD Neurologist [facility] Pager: [phone]

## 2023-04-07 NOTE — Evaluation (Signed)
Speech Language Pathology Evaluation Patient Details Name: Joseph [name] MRN: [id number] DOB: 30-Mar-1925 Today's Date: 04/07/2023 Time: 1240-1250 SLP Time Calculation (min) (ACUTE ONLY): 10 min  Problem List:  Patient Active Problem List   Diagnosis Date Noted   Stroke (HCC) 04/06/2023   Type II diabetes mellitus with renal manifestations (HCC) 04/06/2023   Chronic kidney disease, stage 3a (HCC) 04/06/2023   COPD (chronic obstructive pulmonary disease) (HCC) 04/06/2023   Chronic diastolic CHF (congestive heart failure) (HCC) 04/06/2023   New onset atrial fibrillation (HCC) 04/06/2023   Obesity (BMI 30-39.9) 04/06/2023   Acute cerebrovascular accident (CVA) (HCC) 12/09/2022   Chronic combined systolic and diastolic CHF (congestive heart failure) (HCC) 12/09/2022   Fall at home, initial encounter 12/09/2022   Hypokalemia 12/09/2022   Hemiparesis affecting right side as late effect of cerebrovascular accident (CVA) (HCC) 12/09/2022   Skin tear of left hand without complication 11/16/2022   Pain of left hip 11/16/2022   Left hip pain 11/10/2022   Intertrigo 11/10/2022   COPD with acute exacerbation (HCC) 10/06/2022   Diabetes mellitus (HCC) 06/10/2022   Enlarged prostate 03/02/2022   Other specified counseling 03/02/2022   Renal calculi 03/02/2022   Gastroesophageal reflux disease 03/02/2022   Hypertensive disorder 03/02/2022   Mixed hyperlipidemia 03/02/2022   Hypothyroidism 03/02/2022   Acute non-recurrent frontal sinusitis 01/05/2022   Glands swollen 01/05/2022   Hypertension associated with diabetes (HCC) 01/05/2022   Acute rhinitis 01/05/2022   AV block, Mobitz 1    NSTEMI (non-ST elevated myocardial infarction) (HCC)    ACS (acute coronary syndrome) (HCC) 12/16/2019   Diverticulitis of large intestine without perforation or abscess without bleeding    Pain of upper abdomen    SOB (shortness of breath)    S/P CABG (coronary artery bypass graft)    Morbid obesity due  to excess calories (HCC)    Back pain, chronic 07/28/2015   Benign fibroma of prostate 07/28/2015   Acid reflux 07/28/2015   Adult hypothyroidism 07/28/2015   Neuropathy 07/28/2015   Arthritis, degenerative 07/28/2015   Psoriasis 07/28/2015   Lumbar spondylosis 05/08/2015   Obesity 09/09/2014   Bilateral leg weakness 09/09/2014   Bilateral leg edema 04/15/2014   Bradycardia 08/13/2013   Edema 06/21/2012   Fatigue 09/15/2011   Type 2 diabetes mellitus with diabetic peripheral angiopathy without gangrene, with long-term current use of insulin (HCC) 09/15/2011   DYSPNEA 06/09/2009   Hyperlipidemia 06/07/2009   Essential hypertension 06/07/2009   Coronary artery disease 06/07/2009   Past Medical History:  Past Medical History:  Diagnosis Date   Acute respiratory failure with hypoxia (HCC) 03/21/2016   CAD (coronary artery disease)    a. s/p CABG;  b. 07/2012 Cath: 3VD including 80 dLCX (med Rx), 4/4 patent grafts.   Chronic diastolic CHF (congestive heart failure) (HCC)    a. 07/2012 Echo: EF 55-60%, no rwma, Gr 1 DD, mild MR;  04/2014 Echo: EF 55-60%, no rwma, mild MR, mildly dil LA.   Diabetes mellitus, type 2 (HCC)    HTN (hypertension)    Hyperlipidemia    Hypothyroidism    Lower extremity edema    a. 03/2014 amlodipine d/c'd.   Spinal stenosis    Past Surgical History:  Past Surgical History:  Procedure Laterality Date   APPENDECTOMY     CARDIAC CATHETERIZATION  07/2012   [facility]/no stents   CHOLECYSTECTOMY     CORNEAL TRANSPLANT     CORONARY ARTERY BYPASS GRAFT  1994   HEMORRHOID SURGERY  LEFT HEART CATH AND CORS/GRAFTS ANGIOGRAPHY N/A 12/17/2019   Procedure: LEFT HEART CATH AND CORS/GRAFTS ANGIOGRAPHY;  Surgeon: Yvonne Kendall, MD;  Location: [facility] INVASIVE CV LAB;  Service: Cardiovascular;  Laterality: N/A;   PROSTATE BIOPSY     TOE SURGERY     ingrown toe nail   HPI:  Joseph [name] is a 89 y.o. male with medical history significant of stroke with right-sided  weakness 11/2022, HTN, HLD, DM, CAD, CABG, dCHF, hypothyroidism, CKD-3A, who presents with generalized weakness and mild left facial droop. Patient states he started feeling generalized weak since yesterday morning, denies unilateral numbness or tingling in extremities.  He was noted to have mild left facial droop, being slower in speaking per caregiver at the bedside.  No vision loss.  Patient has chronic hard of hearing, which has not changed.  MRI (04/06/2023) revealed acute infarct in the right posterior limb of the internal capsule and corona radiata.   Assessment / Plan / Recommendation Clinical Impression  Pt is known to this writer from previous hospitalization. Pt states that he recognizes me as well. During initial portion of the evaluation, pt's caregiver was present with his daughter arriving for the later portion of the evaluation. Pt presents with functional speech intelligibility as well as overall grossly intact cognitive abilities.   Despite this, pt was observed with left inattention and didn't turn his head to his left during entire evaluation to make eye contact with this Clinical research associate. In addition, his caregiver was observed moving his lunch plate to pt's right to increase his consumption. His cup was slightly left of midline which he located without cues. When cued, pt able to maintain eye contact with me to his left. When his daughter was present, pt didn't turn to look at her. Education provided to pt's daugther with advice provided to stand on pt's left and encourage him to make eye contact to his left during conversation. Also of note, pt is right hand dominant. Per pt, his daughter and caregiver report, pt had returned to using his right hand for self-feeding. However, during this evaluation, pt feed himself soley with his left hand. When prompted to use his right hand by this Clinical research associate, he kept it at his side. Pt's medical team notified of the above concerns. Pt would benefit from follow up ST  services at his next venue of care.    SLP Assessment  SLP Recommendation/Assessment: All further Speech Lanaguage Pathology  needs can be addressed in the next venue of care SLP Visit Diagnosis: Cognitive communication deficit (R41.841)    Recommendations for follow up therapy are one component of a multi-disciplinary discharge planning process, led by the attending physician.  Recommendations may be updated based on patient status, additional functional criteria and insurance authorization.    Follow Up Recommendations  Skilled nursing-short term rehab (<3 hours/day)    Assistance Recommended at Discharge  Frequent or constant Supervision/Assistance  Functional Status Assessment Patient has had a recent decline in their functional status and demonstrates the ability to make significant improvements in function in a reasonable and predictable amount of time.  Frequency and Duration   N/A        SLP Evaluation Cognition  Overall Cognitive Status: Within Functional Limits for tasks assessed Arousal/Alertness: Awake/alert Orientation Level: Oriented X4       Comprehension  Auditory Comprehension Overall Auditory Comprehension: Appears within functional limits for tasks assessed    Expression Expression Primary Mode of Expression: Verbal Verbal Expression Overall Verbal Expression: Appears  within functional limits for tasks assessed Written Expression Dominant Hand: Right Written Expression: Not tested   Oral / Motor  Oral Motor/Sensory Function Overall Oral Motor/Sensory Function: Mild impairment Facial ROM: Reduced right (at basleine from previous stroke) Motor Speech Overall Motor Speech: Appears within functional limits for tasks assessed           Gilberta Peeters B. Dreama Saa, M.S., CCC-SLP, Tree surgeon Certified Brain Injury Specialist [facility]  [facility] Office [phone] Ascom [phone] Fax  [phone]

## 2023-04-07 NOTE — Progress Notes (Signed)
*  PRELIMINARY RESULTS* Echocardiogram 2D Echocardiogram has been performed.  Joseph [name] 04/07/2023, 11:45 AM

## 2023-04-07 NOTE — Evaluation (Signed)
Physical Therapy Evaluation Patient Details Name: Joseph [name] MRN: [id number] DOB: 1925-03-11 Today's Date: 04/07/2023  History of Present Illness  Joseph [name] is a 89 y.o. male with medical history significant of stroke with right-sided weakness 11/2022, HTN, HLD, DM, CAD, CABG, dCHF, hypothyroidism, CKD-3A, who presents with generalized weakness and mild left facial droop.   Clinical Impression  Pt admitted with above diagnosis. Pt currently with functional limitations due to the deficits listed below (see PT Problem List). Pt received upright in bed agreeable to PT. Caretaker, Alvino Chapel at bedside and daughter present via speaker phone throughout session. At baseline pt is mod-I with RW and mod-I with his ADL's relying on caregiver assist for LB dressing and IADL's. Typically pt has PCA's 6 days/week 8:30-12:30 and then evenings 5:30-10:30 at night but daughter reporting pt will not be having his evening PCA's due to personal matters/concerns.   To date pt is mod-I with bed mobility with bed features but this is effortful and takes time. Pt without focal weakness and intact RAMPS and HTS. Dysmetric on RUE FTN and normal on LUE anticipating this is due to residual deficit from prior CVA. Pt denies any visual deficits. Pt is able to stand at minguard and bed elevated with bouts of momentum and ambulate to bathroom endorsing BM. Pt with poor proprioception and motor control with RLE in swing phase and foot placement but is generally steady despite mild VC's to maintain RW closer to BOS. Similar to FTN, anticipate this is residual from prior CVA. Upon sitting on toilet pt left in care of OT for hand off. Discussion with pt's daughter that due to limited support at home, will plan to recommend additional PT resources at d/c due to acute weakness from acute CVA.      Recommendations for follow up therapy are one component of a multi-disciplinary discharge planning process, led by the attending  physician.  Recommendations may be updated based on patient status, additional functional criteria and insurance authorization.  Follow Up Recommendations Can patient physically be transported by private vehicle: Yes     Assistance Recommended at Discharge Frequent or constant Supervision/Assistance  Patient can return home with the following  A little help with walking and/or transfers;Assistance with cooking/housework;Assist for transportation;A little help with bathing/dressing/bathroom;Help with stairs or ramp for entrance    Equipment Recommendations None recommended by PT  Recommendations for Other Services       Functional Status Assessment Patient has had a recent decline in their functional status and demonstrates the ability to make significant improvements in function in a reasonable and predictable amount of time.     Precautions / Restrictions Precautions Precautions: Fall Restrictions Weight Bearing Restrictions: No      Mobility  Bed Mobility Overal bed mobility: Modified Independent             General bed mobility comments: with bed features Patient Response: Cooperative  Transfers Overall transfer level: Needs assistance Equipment used: Rolling walker (2 wheels) Transfers: Sit to/from Stand Sit to Stand: Min guard, From elevated surface           General transfer comment: bouts of momentum and VC's for hand placement. Bed elevated to approximate reported home height    Ambulation/Gait Ambulation/Gait assistance: Min guard Gait Distance (Feet): 25 Feet Assistive device: Rolling walker (2 wheels) Gait Pattern/deviations: Step-through pattern, Trunk flexed       General Gait Details: Safe use of RW with gait despite minor VC's to maintain RW closer to  BOS. Generally with poor RLe proprioception and motor sequencing of RLE in swing phase which I anticipate is residual from prior CVA.  Stairs            Wheelchair Mobility    Modified  Rankin (Stroke Patients Only)       Balance Overall balance assessment: Needs assistance Sitting-balance support: Bilateral upper extremity supported, Feet supported Sitting balance-Leahy Scale: Fair     Standing balance support: Bilateral upper extremity supported, During functional activity Standing balance-Leahy Scale: Fair Standing balance comment: relies on RW at baseline                             Pertinent Vitals/Pain Pain Assessment Pain Assessment: No/denies pain    Home Living Family/patient expects to be discharged to:: Private residence Living Arrangements: Alone Available Help at Discharge: Personal care attendant Type of Home: House Home Access: Ramped entrance       Home Layout: One level Home Equipment: Pharmacist, hospital (2 wheels);Rollator (4 wheels);Transport chair;Cane - single point;Hand held shower head;Grab bars - tub/shower Additional Comments: PCA 6 days/week 8:30-12:30 then 5:30 - 10:30 pm. uses BSC next to bed at night time when alone. Daughter reports pt will not have night time PCA's any longer due to personal matters/concerns. Attempting to find replacements.    Prior Function Prior Level of Function : Independent/Modified Independent;Needs assist       Physical Assist : ADLs (physical)   ADLs (physical): Dressing;IADLs Mobility Comments: Mod-I with RW since prior CVA ADLs Comments: Assist for LB dressing     Hand Dominance   Dominant Hand: Right    Extremity/Trunk Assessment   Upper Extremity Assessment Upper Extremity Assessment: RUE deficits/detail RUE Deficits / Details: Dysmetric with FTN anticipate due to residual deficit from prior CVA    Lower Extremity Assessment Lower Extremity Assessment: Generalized weakness;RLE deficits/detail RLE Deficits / Details: generally with poor proprioception/motor control with gait in swing phase but symmetric strength . Anticipate this is residual deficit from prior  CVA.       Communication   Communication: No difficulties;HOH  Cognition Arousal/Alertness: Awake/alert Behavior During Therapy: WFL for tasks assessed/performed Overall Cognitive Status: Within Functional Limits for tasks assessed                                          General Comments      Exercises Other Exercises Other Exercises: Role of PT in acute setting, safe use of DME. Other Exercises: FTN dysmetric on RLE, normal on LUE. RAMPS normal bilat, MMT symmetrical in Ue's and LE's 4/5 bilat   Assessment/Plan    PT Assessment Patient needs continued PT services  PT Problem List Decreased strength;Decreased activity tolerance;Decreased balance       PT Treatment Interventions DME instruction;Therapeutic activities;Gait training;Therapeutic exercise;Balance training;Functional mobility training;Neuromuscular re-education    PT Goals (Current goals can be found in the Care Plan section)  Acute Rehab PT Goals Patient Stated Goal: to go home if safe to do so PT Goal Formulation: With patient/family Time For Goal Achievement: 04/21/23 Potential to Achieve Goals: Fair    Frequency Min 4X/week     Co-evaluation               AM-PAC PT "6 Clicks" Mobility  Outcome Measure Help needed turning from your back to your side while in  a flat bed without using bedrails?: A Little Help needed moving from lying on your back to sitting on the side of a flat bed without using bedrails?: A Lot Help needed moving to and from a bed to a chair (including a wheelchair)?: A Little Help needed standing up from a chair using your arms (e.g., wheelchair or bedside chair)?: A Little Help needed to walk in hospital room?: A Little Help needed climbing 3-5 steps with a railing? : A Lot 6 Click Score: 16    End of Session Equipment Utilized During Treatment: Gait belt Activity Tolerance: Patient tolerated treatment well Patient left: in chair;with call bell/phone  within reach;with chair alarm set;with family/visitor present (in care of OT) Nurse Communication: Mobility status PT Visit Diagnosis: Unsteadiness on feet (R26.81);Muscle weakness (generalized) (M62.81);Difficulty in walking, not elsewhere classified (R26.2)    Time: 1610-9604 PT Time Calculation (min) (ACUTE ONLY): 27 min   Charges:   PT Evaluation $PT Eval Low Complexity: 1 Low PT Treatments $Therapeutic Activity: 8-22 mins      Kebrina Friend M. Fairly IV, PT, DPT Physical Therapist- [facility]  04/07/2023, 11:17 AM

## 2023-04-07 NOTE — Progress Notes (Signed)
Patient sent to CT via bed in stable condition. 

## 2023-04-07 NOTE — TOC Progression Note (Signed)
Transition of Care [facility]) - Progression Note    Patient Details  Name: NYHEEM MADURO MRN: [id number] Date of Birth: [birth year]  Transition of [facility]) CM/SW Contact  Allena Katz, LCSW Phone Number: 04/07/2023, 3:23 PM  Clinical Narrative:   CSW spoke with pt's daughter who reports that she would like to go to [place] as she has been to [place] before. CSW will start workup.          Expected Discharge Plan and Services                                               Social Determinants of Health (SDOH) Interventions SDOH Screenings   Food Insecurity: No Food Insecurity (04/06/2023)  Housing: Low Risk  (04/06/2023)  Transportation Needs: No Transportation Needs (04/06/2023)  Utilities: Not At Risk (04/06/2023)  Alcohol Screen: Low Risk  (03/02/2022)  Depression (PHQ2-9): Low Risk  (01/13/2023)  Financial Resource Strain: Low Risk  (03/02/2022)  Physical Activity: Insufficiently Active (03/02/2022)  Social Connections: Moderately Integrated (03/02/2022)  Stress: No Stress Concern Present (03/02/2022)  Tobacco Use: Medium Risk (01/13/2023)    Readmission Risk Interventions    12/10/2022    3:58 PM  Readmission Risk Prevention Plan  Transportation Screening Complete  PCP or Specialist Appt within 5-7 Days Complete  Home Care Screening Complete  Medication Review (RN CM) Complete

## 2023-04-07 NOTE — Evaluation (Signed)
Clinical/Bedside Swallow Evaluation Patient Details  Name: Joseph [name] MRN: [id number] Date of Birth: 11-17-1925  Today's Date: 04/07/2023 Time: SLP Start Time (ACUTE ONLY): 1250 SLP Stop Time (ACUTE ONLY): 1304 SLP Time Calculation (min) (ACUTE ONLY): 14 min  Past Medical History:  Past Medical History:  Diagnosis Date   Acute respiratory failure with hypoxia (HCC) 03/21/2016   CAD (coronary artery disease)    a. s/p CABG;  b. 07/2012 Cath: 3VD including 80 dLCX (med Rx), 4/4 patent grafts.   Chronic diastolic CHF (congestive heart failure) (HCC)    a. 07/2012 Echo: EF 55-60%, no rwma, Gr 1 DD, mild MR;  04/2014 Echo: EF 55-60%, no rwma, mild MR, mildly dil LA.   Diabetes mellitus, type 2 (HCC)    HTN (hypertension)    Hyperlipidemia    Hypothyroidism    Lower extremity edema    a. 03/2014 amlodipine d/c'd.   Spinal stenosis    Past Surgical History:  Past Surgical History:  Procedure Laterality Date   APPENDECTOMY     CARDIAC CATHETERIZATION  07/2012   [facility]/no stents   CHOLECYSTECTOMY     CORNEAL TRANSPLANT     CORONARY ARTERY BYPASS GRAFT  1994   HEMORRHOID SURGERY     LEFT HEART CATH AND CORS/GRAFTS ANGIOGRAPHY N/A 12/17/2019   Procedure: LEFT HEART CATH AND CORS/GRAFTS ANGIOGRAPHY;  Surgeon: Yvonne Kendall, MD;  Location: [facility] INVASIVE CV LAB;  Service: Cardiovascular;  Laterality: N/A;   PROSTATE BIOPSY     TOE SURGERY     ingrown toe nail   HPI:  Joseph [name] is a 89 y.o. male with medical history significant of stroke with right-sided weakness 11/2022, HTN, HLD, DM, CAD, CABG, dCHF, hypothyroidism, CKD-3A, who presents with generalized weakness and mild left facial droop. Patient states he started feeling generalized weak since yesterday morning, denies unilateral numbness or tingling in extremities.  He was noted to have mild left facial droop, being slower in speaking per caregiver at the bedside.  No vision loss.  Patient has chronic hard of hearing, which has  not changed.  MRI (04/06/2023) revealed acute infarct in the right posterior limb of the internal capsule and corona radiata.    Assessment / Plan / Recommendation  Clinical Impression  Pt reported "I have noticed a difference in my swallowing and speech since this stroke." Pt was consuming his lunch consisting of pizza (that his caregiver had cut) and thin liquids via straw.   When consuming the pizza, pt was observed with mildly prolonged mastication and oral prep. However, pizza appeared to be room temperature and slightly tough. Therefore, SLP provided pt with graham crackers and peanut butter. Pt's oral phase was very much within functional limits when consuming these items. Therefore don't recommend any changes to diet textures.   Pt was also observed with subtle throat clears when consuming thin liquids via straw as well as a moderate cough x 1 to which patient commented "that is what I am talking about." All overt s/s of aspiration resolved when drinking via cup -NO STRAW. Extensive education provided to his daughter on recommendation that pt consume all liquids via cup -NO STRAW. Pt and daughter voiced understanding. At this time, pt's risk of aspiration appears reduced when consuming regular diet with thin liquids via cup (no straw), medicine whole with thin liquids. ST services are not indicated for diet management.  SLP Visit Diagnosis: Dysphagia, pharyngeal phase (R13.13);Cognitive communication deficit (R41.841)    Aspiration Risk  Mild aspiration risk  Diet Recommendation Regular;Thin liquid   Liquid Administration via: Cup;No straw Medication Administration: Whole meds with liquid Supervision: Intermittent supervision to cue for compensatory strategies Compensations: Minimize environmental distractions;Slow rate;Small sips/bites Postural Changes: Seated upright at 90 degrees;Remain upright for at least 30 minutes after po intake    Other  Recommendations Oral Care  Recommendations: Oral care BID    Recommendations for follow up therapy are one component of a multi-disciplinary discharge planning process, led by the attending physician.  Recommendations may be updated based on patient status, additional functional criteria and insurance authorization.  Follow up Recommendations Skilled nursing-short term rehab (<3 hours/day)      Assistance Recommended at Discharge Frequent or constant Supervision/Assistance  Functional Status Assessment Patient has had a recent decline in their functional status and demonstrates the ability to make significant improvements in function in a reasonable and predictable amount of time.  Frequency and Duration   N/A        Prognosis   N/A     Swallow Study   General Date of Onset: 04/06/23 HPI: Joseph [name] is a 89 y.o. male with medical history significant of stroke with right-sided weakness 11/2022, HTN, HLD, DM, CAD, CABG, dCHF, hypothyroidism, CKD-3A, who presents with generalized weakness and mild left facial droop. Patient states he started feeling generalized weak since yesterday morning, denies unilateral numbness or tingling in extremities.  He was noted to have mild left facial droop, being slower in speaking per caregiver at the bedside.  No vision loss.  Patient has chronic hard of hearing, which has not changed.  MRI (04/06/2023) revealed acute infarct in the right posterior limb of the internal capsule and corona radiata. Type of Study: Bedside Swallow Evaluation Previous Swallow Assessment: none in chart Diet Prior to this Study: Regular;Thin liquids (Level 0) Temperature Spikes Noted: No Respiratory Status: Room air History of Recent Intubation: No Behavior/Cognition: Alert;Cooperative Oral Cavity Assessment: Within Functional Limits Oral Care Completed by SLP: No Oral Cavity - Dentition: Adequate natural dentition Vision: Functional for self-feeding Self-Feeding Abilities: Able to feed  self;Needs set up Patient Positioning: Upright in bed Baseline Vocal Quality: Normal Volitional Cough: Strong Volitional Swallow: Able to elicit    Oral/Motor/Sensory Function Overall Oral Motor/Sensory Function: Mild impairment Facial ROM: Reduced right (at baseline from previous stroke)   Ice Chips Ice chips: Not tested   Thin Liquid Thin Liquid: Impaired Presentation: Cup;Straw Pharyngeal  Phase Impairments: Suspected delayed Swallow;Throat Clearing - Immediate;Cough - Immediate (with straw)    Nectar Thick Nectar Thick Liquid: Not tested   Honey Thick Honey Thick Liquid: Not tested   Puree Puree: Within functional limits Presentation: Spoon;Self Fed   Solid     Solid: Within functional limits Presentation: Self Fed     Shane Melby B. Dreama Saa, M.S., CCC-SLP, Tree surgeon Certified Brain Injury Specialist [facility]  [facility] Office [phone] Ascom [phone] Fax [phone]

## 2023-04-07 NOTE — Plan of Care (Signed)
  Problem: Education: Goal: Knowledge of disease or condition will improve Outcome: Progressing Goal: Knowledge of secondary prevention will improve (MUST DOCUMENT ALL) Outcome: Progressing   Problem: Ischemic Stroke/TIA Tissue Perfusion: Goal: Complications of ischemic stroke/TIA will be minimized Outcome: Progressing   Problem: Coping: Goal: Will verbalize positive feelings about self Outcome: Progressing Goal: Will identify appropriate support needs Outcome: Progressing   Problem: Health Behavior/Discharge Planning: Goal: Goals will be collaboratively established with patient/family Outcome: Progressing   Problem: Self-Care: Goal: Ability to participate in self-care as condition permits will improve Outcome: Progressing Goal: Verbalization of feelings and concerns over difficulty with self-care will improve Outcome: Progressing Goal: Ability to communicate needs accurately will improve Outcome: Progressing   Problem: Nutrition: Goal: Risk of aspiration will decrease Outcome: Progressing   Problem: Education: Goal: Knowledge of disease or condition will improve Outcome: Progressing Goal: Knowledge of secondary prevention will improve (MUST DOCUMENT ALL) Outcome: Progressing   Problem: Ischemic Stroke/TIA Tissue Perfusion: Goal: Complications of ischemic stroke/TIA will be minimized Outcome: Progressing   Problem: Coping: Goal: Will verbalize positive feelings about self Outcome: Progressing Goal: Will identify appropriate support needs Outcome: Progressing   Problem: Health Behavior/Discharge Planning: Goal: Goals will be collaboratively established with patient/family Outcome: Progressing   Problem: Self-Care: Goal: Ability to participate in self-care as condition permits will improve Outcome: Progressing Goal: Verbalization of feelings and concerns over difficulty with self-care will improve Outcome: Progressing Goal: Ability to communicate needs  accurately will improve Outcome: Progressing   Problem: Nutrition: Goal: Risk of aspiration will decrease Outcome: Progressing

## 2023-04-07 NOTE — Consult Note (Signed)
Cardiology Consultation   Patient ID: [name] MRN: [id number]; DOB: 1925/02/20  Admit date: 04/06/2023 Date of Consult: 04/07/2023  PCP:  Bosie Clos, MD   [facility] Providers Cardiologist: Mariah Milling Physician requesting consult: Dr. Meriam Sprague Reason for consult: Persistent atrial fibrillation   Patient Profile:   Joseph [name] is a 89 y.o. male with a hx of  CAD, CABG in 1994,  PAD , followed by Dr.  Wyn Quaker,  obesity,  hyperlipidemia,  arthritis of the knees,   fatigue, SOB, leg weakness,   lower extremity edema,  Carotid u/s  Less than 39% stenosis bilaterally AV block on beta-blockers Dilated cardiomyopathy ejection fraction 35 to 40% in June 2023 Presenting with acute stroke, weakness  History of Present Illness:   [name] reports having stroke January 2024, recovered from right-sided deficits, able to ambulate with a walker Presents May 16 with weakness all over starting Monday, May 13th Unable to ambulate with walker, trouble getting up from his chair Hypertensive in the emergency room 156/80 MRI in the emergency room confirming acute infarcts right posterior limb of the internal capsule, noted to have mild left sided facial droop He was previously on aspirin Plavix for 21 days, then continued Plavix thereafter  EKG confirming atrial fibrillation rate in the 60s EKG from December 09, 2022, unable to exclude atrial fibrillation at that time  It was recommended he start Eliquis 5 twice daily, Patient is very concerned about starting anticoagulation given long history of nosebleeds, reports his wife died from a blood thinner, details unclear  He reports he has previously seen ENT, had cauterizations in his nose, Typically has nosebleeds twice a month but daughter feels this happens more frequently  Past Medical History:  Diagnosis Date   Acute respiratory failure with hypoxia (HCC) 03/21/2016   CAD (coronary artery disease)    a. s/p  CABG;  b. 07/2012 Cath: 3VD including 80 dLCX (med Rx), 4/4 patent grafts.   Chronic diastolic CHF (congestive heart failure) (HCC)    a. 07/2012 Echo: EF 55-60%, no rwma, Gr 1 DD, mild MR;  04/2014 Echo: EF 55-60%, no rwma, mild MR, mildly dil LA.   Diabetes mellitus, type 2 (HCC)    HTN (hypertension)    Hyperlipidemia    Hypothyroidism    Lower extremity edema    a. 03/2014 amlodipine d/c'd.   Spinal stenosis     Past Surgical History:  Procedure Laterality Date   APPENDECTOMY     CARDIAC CATHETERIZATION  07/2012   [facility]/no stents   CHOLECYSTECTOMY     CORNEAL TRANSPLANT     CORONARY ARTERY BYPASS GRAFT  1994   HEMORRHOID SURGERY     LEFT HEART CATH AND CORS/GRAFTS ANGIOGRAPHY N/A 12/17/2019   Procedure: LEFT HEART CATH AND CORS/GRAFTS ANGIOGRAPHY;  Surgeon: Yvonne Kendall, MD;  Location: [facility] INVASIVE CV LAB;  Service: Cardiovascular;  Laterality: N/A;   PROSTATE BIOPSY     TOE SURGERY     ingrown toe nail     Home Medications:  Prior to Admission medications   Medication Sig Start Date End Date Taking? Authorizing Provider  atorvastatin (LIPITOR) 80 MG tablet Take 1 tablet (80 mg total) by mouth daily. Patient taking differently: Take 80 mg by mouth at bedtime. 02/24/23  Yes Antonieta Iba, MD  clopidogrel (PLAVIX) 75 MG tablet Take 1 tablet (75 mg total) by mouth daily. 02/24/23  Yes Antonieta Iba, MD  fenofibrate 54 MG tablet Take 1 tablet (54 mg  total) by mouth daily. 12/13/22 04/06/23 Yes Kirby Crigler, Mir Judie Petit, MD  Infant Care Products [facility]) OINT Apply 1 application  topically daily as needed. Apply to peri area topically every shift for skin protectant   Yes [provider]  insulin aspart (NOVOLOG) 100 UNIT/ML injection Inject 20-25 Units into the skin 3 (three) times daily with meals. 25 units in the morning 20 units at noon 25 units in the evening   Yes [provider]  insulin glargine (LANTUS) 100 UNIT/ML injection Inject 40 Units into the skin  daily.   Yes [provider]  levothyroxine (SYNTHROID) 50 MCG tablet TAKE 1 TABLET BY MOUTH ONCE DAILY BEFORE BREAKFAST 12/15/20  Yes Bosie Clos, MD  losartan (COZAAR) 100 MG tablet Take 1 tablet (100 mg total) by mouth daily. 02/24/23  Yes Antonieta Iba, MD  Magnesium 250 MG TABS Take 250 mg by mouth daily. Take when you take your Torsemide.   Yes [provider]  nitroGLYCERIN (NITROSTAT) 0.4 MG SL tablet Place 1 tablet (0.4 mg total) under the tongue every 5 (five) minutes as needed for chest pain. 09/30/21  Yes Bosie Clos, MD  omeprazole (PRILOSEC) 20 MG capsule Take 1 capsule by mouth once daily 08/10/22  Yes Bosie Clos, MD  potassium chloride (KLOR-CON M10) 10 MEQ tablet Take 2 tablets (20 mEq total) by mouth daily. 12/13/22 04/06/23 Yes Kirby Crigler, Mir M, MD  torsemide (DEMADEX) 20 MG tablet Take 1 tablet (20 mg total) by mouth daily. 02/24/23  Yes Furth, Cadence H, PA-C  hydrALAZINE (APRESOLINE) 50 MG tablet Take 1 tablet (50 mg total) by mouth 2 (two) times daily. Patient not taking: Reported on 04/06/2023 01/13/23   Medina-Vargas, Monina C, NP  oxymetazoline (AFRIN) 0.05 % nasal spray Place 2 sprays into both nostrils once for nose bleed, seek medical attention if bleeding persists after 2 sprays, do not repeat dose Patient not taking: Reported on 04/06/2023 02/01/23   Ronnald Ramp, MD    Inpatient Medications: Scheduled Meds:  [START ON 04/08/2023] apixaban  5 mg Oral BID   fenofibrate  54 mg Oral Daily   insulin aspart  0-5 Units Subcutaneous QHS   insulin aspart  0-9 Units Subcutaneous TID WC   insulin glargine-yfgn  30 Units Subcutaneous Daily   levothyroxine  50 mcg Oral QAC breakfast   magnesium oxide  200 mg Oral Daily   melatonin  5 mg Oral QHS   pantoprazole  40 mg Oral Daily   Continuous Infusions:  PRN Meds: acetaminophen **OR** acetaminophen (TYLENOL) oral liquid 160 mg/5 mL **OR** acetaminophen, albuterol,  dextromethorphan-guaiFENesin, hydrALAZINE, nitroGLYCERIN, ondansetron (ZOFRAN) IV, senna-docusate  Allergies:    Allergies  Allergen Reactions   Farxiga [Dapagliflozin] Rash    Genital rash    Social History:   Social History   Socioeconomic History   Marital status: Widowed    Spouse name: Not on file   Number of children: 3   Years of education: Not on file   Highest education level: Some college, no degree  Occupational History   Occupation: retired  Tobacco Use   Smoking status: Former    Packs/day: 0.00    Years: 6.00    Additional pack years: 0.00    Total pack years: 0.00    Types: Pipe, Cigars, Cigarettes    Quit date: 12/23/1980    Years since quitting: 42.3   Smokeless tobacco: Never  Vaping Use   Vaping Use: Never used  Substance and Sexual Activity  Alcohol use: No   Drug use: No   Sexual activity: Never  Other Topics Concern   Not on file  Social History Narrative   Retired.    Social Determinants of Health   Financial Resource Strain: Low Risk  (03/02/2022)   Overall Financial Resource Strain (CARDIA)    Difficulty of Paying Living Expenses: Not hard at all  Food Insecurity: No Food Insecurity (04/06/2023)   Hunger Vital Sign    Worried About Running Out of Food in the Last Year: Never true    Ran Out of Food in the Last Year: Never true  Transportation Needs: No Transportation Needs (04/06/2023)   PRAPARE - Administrator, Civil Service (Medical): No    Lack of Transportation (Non-Medical): No  Physical Activity: Insufficiently Active (03/02/2022)   Exercise Vital Sign    Days of Exercise per Week: 2 days    Minutes of Exercise per Session: 20 min  Stress: No Stress Concern Present (03/02/2022)   [employer] of Occupational Health - Occupational Stress Questionnaire    Feeling of Stress : Not at all  Social Connections: Moderately Integrated (03/02/2022)   Social Connection and Isolation Panel [NHANES]    Frequency of  Communication with Friends and Family: More than three times a week    Frequency of Social Gatherings with Friends and Family: Once a week    Attends Religious Services: More than 4 times per year    Active Member of [employer] or Organizations: No    Attends Engineer, structural: More than 4 times per year    Marital Status: Widowed  Intimate Partner Violence: Not At Risk (04/06/2023)   Humiliation, Afraid, Rape, and Kick questionnaire    Fear of Current or Ex-Partner: No    Emotionally Abused: No    Physically Abused: No    Sexually Abused: No    Family History:    Family History  Problem Relation Age of Onset   Heart failure Mother    Heart attack Father    Stroke Brother    Heart failure Maternal Uncle    Heart failure Maternal Grandfather    Sleep apnea Daughter    Sleep apnea Son    Kidney Stones Son      ROS:  Please see the history of present illness.  Review of Systems  Constitutional: Negative.   HENT: Negative.    Respiratory: Negative.    Cardiovascular: Negative.   Gastrointestinal: Negative.   Musculoskeletal: Negative.   Neurological: Negative.   Psychiatric/Behavioral: Negative.    All other systems reviewed and are negative.   Physical Exam/Data:   Vitals:   04/07/23 0058 04/07/23 0453 04/07/23 0843 04/07/23 1112  BP: (!) 156/53 (!) 151/55 (!) 160/78 (!) 157/87  Pulse: (!) 57 (!) 50 71 (!) 56  Resp: 20 16 18 18   Temp: 97.7 F (36.5 C) 98.8 F (37.1 C) 97.9 F (36.6 C) 98 F (36.7 C)  TempSrc:      SpO2: 98% 96% 98%   Weight:      Height:        Intake/Output Summary (Last 24 hours) at 04/07/2023 1630 Last data filed at 04/07/2023 1345 Gross per 24 hour  Intake 240 ml  Output 700 ml  Net -460 ml      04/06/2023   12:02 PM 01/13/2023   10:16 AM 01/12/2023   11:24 AM  Last 3 Weights  Weight (lbs) 208 lb 208 lb 8 oz 208 lb 12.8 oz  Weight (kg) 94.348 kg 94.575 kg 94.711 kg     Body mass index is 32.58 kg/m.  General:  Well  nourished, well developed, in no acute distress HEENT: normal Neck: no JVD Vascular: No carotid bruits; Distal pulses 2+ bilaterally Cardiac: Irregularly irregular no murmur  Lungs:  clear to auscultation bilaterally, no wheezing, rhonchi or rales  Abd: soft, nontender, no hepatomegaly  Ext: no edema Musculoskeletal:  No deformities, BUE and BLE strength normal and equal Skin: warm and dry  Neuro:  CNs 2-12 intact, no focal abnormalities noted Psych:  Normal affect   EKG:  The EKG was personally reviewed and demonstrates:   Atrial fibrillation rate 62 bpm  Telemetry:  Telemetry was personally reviewed and demonstrates:   Atrial fibrillation  Relevant CV Studies:   Laboratory Data:  High Sensitivity Troponin:   Recent Labs  Lab 04/06/23 1205 04/06/23 1206  TROPONINIHS 14 14     Chemistry Recent Labs  Lab 04/06/23 1206 04/07/23 0953  NA 137 139  K 3.6 3.6  CL 105 110  CO2 24 23  GLUCOSE 240* 168*  BUN 19 16  CREATININE 1.32* 1.09  CALCIUM 8.7* 8.7*  GFRNONAA 50* >60  ANIONGAP 8 6    Recent Labs  Lab 04/06/23 1206  PROT 6.3*  ALBUMIN 3.3*  AST 30  ALT 15  ALKPHOS 39  BILITOT 1.0   Lipids  Recent Labs  Lab 04/07/23 0410  CHOL 126  TRIG 138  HDL 28*  LDLCALC 70  CHOLHDL 4.5    Hematology Recent Labs  Lab 04/06/23 1206 04/07/23 0953  WBC 6.6 4.8  RBC 4.26 4.30  HGB 12.9* 12.8*  HCT 38.7* 38.4*  MCV 90.8 89.3  MCH 30.3 29.8  MCHC 33.3 33.3  RDW 14.1 13.9  PLT 256 237   Thyroid  Recent Labs  Lab 04/06/23 1206  TSH 1.962    BNP Recent Labs  Lab 04/06/23 1206  BNP 97.9    DDimer No results for input(s): "DDIMER" in the last 168 hours.   Radiology/Studies:  CT HEAD WO CONTRAST ( )  Result Date: 04/07/2023 CLINICAL DATA:  Stroke, follow up EXAM: CT HEAD WITHOUT CONTRAST TECHNIQUE: Contiguous axial images were obtained from the base of the skull through the vertex without intravenous contrast. RADIATION DOSE REDUCTION: This exam  was performed according to the departmental dose-optimization program which includes automated exposure control, adjustment of the mA and/or kV according to patient size and/or use of iterative reconstruction technique. COMPARISON:  MR Brain 04/06/23, CT head 04/06/23 FINDINGS: Brain: No hemorrhage. No hydrocephalus. There are prominent subarachnoid spaces bilaterally, unchanged. There is sequela of moderate chronic microvascular ischemic change with a chronic infarct in the left corona radiata and an acute infarct in the right basal ganglia, better characterized on recent brain MRI. Vascular: No hyperdense vessel or unexpected calcification. Skull: Normal. Negative for fracture or focal lesion. Sinuses/Orbits: No mastoid or middle ear effusion. Paranasal sinuses are clear. Bilateral lens replacement. Orbits are otherwise unremarkable. Other: None. IMPRESSION: 1. No acute intracranial hemorrhage. 2. Acute infarct in the right basal ganglia, better characterized on recent brain MRI. Electronically Signed   By: Lorenza Cambridge M.D.   On: 04/07/2023 16:08   CT ANGIO HEAD NECK W WO CM  Result Date: 04/06/2023 CLINICAL DATA:  Initial evaluation for neuro deficit, stroke. EXAM: CT ANGIOGRAPHY HEAD AND NECK WITH AND WITHOUT CONTRAST TECHNIQUE: Multidetector CT imaging of the head and neck was performed using the standard protocol during bolus administration of  intravenous contrast. Multiplanar CT image reconstructions and MIPs were obtained to evaluate the vascular anatomy. Carotid stenosis measurements (when applicable) are obtained utilizing NASCET criteria, using the distal internal carotid diameter as the denominator. RADIATION DOSE REDUCTION: This exam was performed according to the departmental dose-optimization program which includes automated exposure control, adjustment of the mA and/or kV according to patient size and/or use of iterative reconstruction technique. CONTRAST:  75mL OMNIPAQUE IOHEXOL 350 MG/ML SOLN  COMPARISON:  MRI and CT from earlier the same day. FINDINGS: CTA NECK FINDINGS Aortic arch: Visualized aortic arch normal caliber with standard branch pattern. Moderate atheromatous change about the arch itself. No high-grade stenosis about the origin the great vessels. Right carotid system: Right common and internal carotid arteries are patent without dissection. Mild to moderate atheromatous change about the right carotid bulb without hemodynamically significant greater 50% stenosis. Left carotid system: Left common and internal carotid arteries are patent without dissection. Moderate atheromatous change about the left carotid bulb without hemodynamically significant greater than 50% stenosis. Vertebral arteries: Both vertebral arteries arise from subclavian arteries. No significant proximal subclavian artery stenosis. Atheromatous plaque at the origin of the right vertebral artery with associated severe stenosis (series 6, image 264). Vertebral arteries otherwise patent without significant stenosis or dissection. Skeleton: No discrete or worrisome osseous lesions. C6 and C7 vertebral bodies are ankylosed. Underlying moderate cervical spondylosis. Prior median sternotomy. Other neck: No other acute abnormality within the neck. Upper chest: Visualized upper chest demonstrates no other acute finding. Review of the MIP images confirms the above findings CTA HEAD FINDINGS Anterior circulation: Atheromatous change about the carotid siphons with associated mild diffuse narrowing on the right with more moderate stenosis on the left. A1 segments patent bilaterally. No aneurysm. Left A1 hypoplastic, accounting for the mildly diminutive left ICA is compared to the right. Normal anterior communicating complex. Anterior cerebral arteries patent without stenosis. No M1 stenosis or occlusion. No proximal MCA branch occlusion or high-grade stenosis. Distal MCA branches grossly perfused and symmetric. Diffuse small vessel  atheromatous irregularity noted. Posterior circulation: Atheromatous change about the proximal V4 segments bilaterally without hemodynamically significant stenosis. Both PICA grossly patent at their origins. Basilar patent without stenosis. Superior cerebral arteries patent bilaterally. Both PCAs primarily supplied via the basilar. Atheromatous change about the PCAs bilaterally. No significant stenosis on the left. Moderate proximal right P2 stenoses noted (series 9, image 21). Right PCA remains patent to its distal aspect. Venous sinuses: Not well assessed due to arterial timing the contrast bolus. Anatomic variants: As above.  No aneurysm. Review of the MIP images confirms the above findings IMPRESSION: 1. Negative CTA for large vessel occlusion or other emergent finding. 2. Moderate atheromatous change about the carotid siphons with associated mild to moderate stenosis, left worse than right. 3. Additional intracranial atheromatous disease with associated moderate proximal right P2 stenoses. Diffuse small vessel atheromatous irregularity. 4. Atheromatous plaque at the origin of the right vertebral artery with associated severe stenosis. Electronically Signed   By: Rise Mu M.D.   On: 04/06/2023 19:27   MR BRAIN WO CONTRAST  Result Date: 04/06/2023 CLINICAL DATA:  Generalized weakness, stroke suspected EXAM: MRI HEAD WITHOUT CONTRAST TECHNIQUE: Multiplanar, multiecho pulse sequences of the brain and surrounding structures were obtained without intravenous contrast. COMPARISON:  12/09/2022 MRI head, correlation is also made with same day CT head FINDINGS: Brain: Restricted diffusion with ADC correlate in the right posterior limb of the internal capsule and corona radiata (series 8, images 26), which measures up to  15 x 6 x 9 mm (AP x TR x CC) this is associated with increased T2 hyperintense signal. No acute hemorrhage, mass, mass effect, or midline shift. No hydrocephalus or extra-axial collection.  Normal craniocervical junction. No hemosiderin deposition to suggest remote hemorrhage. Sequela of prior infarct in the left posterior limb of the internal capsule and corona radiata. Vascular: Normal arterial flow voids. Skull and upper cervical spine: Normal marrow signal. Sinuses/Orbits: Clear paranasal sinuses. No acute finding in the orbits. Status post bilateral lens replacements. Other: Trace fluid in right mastoid air cells. IMPRESSION: Acute infarct in the right posterior limb of the internal capsule and corona radiata. These results were called by telephone at the time of interpretation on 04/06/2023 at 4:39 pm to provider [facility] , who verbally acknowledged these results. Electronically Signed   By: Wiliam Ke M.D.   On: 04/06/2023 16:42   CT HEAD WO CONTRAST ( )  Result Date: 04/06/2023 CLINICAL DATA:  Neuro deficit, acute, stroke suspected. Additional history provided: Weakness, prior stroke. EXAM: CT HEAD WITHOUT CONTRAST TECHNIQUE: Contiguous axial images were obtained from the base of the skull through the vertex without intravenous contrast. RADIATION DOSE REDUCTION: This exam was performed according to the departmental dose-optimization program which includes automated exposure control, adjustment of the mA and/or kV according to patient size and/or use of iterative reconstruction technique. COMPARISON:  Non-contrast head CT 12/10/2022. Brain MRI 12/09/2022. FINDINGS: Brain: Known chronic infarct within the left corona radiata/posterior limb of left internal capsule. There is no acute intracranial hemorrhage. No demarcated cortical infarct. No extra-axial fluid collection. No evidence of an intracranial mass. No midline shift. Vascular: No hyperdense vessel. Atherosclerotic calcifications. Skull: No fracture or aggressive osseous lesion. Sinuses/Orbits: No mass or acute finding within the imaged orbits. No significant paranasal sinus disease at the imaged levels. IMPRESSION: 1.  No evidence  of an acute intracranial abnormality. 2. 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 Electronically Signed   By: Jackey Loge D.O.   On: 04/06/2023 13:09     Assessment and Plan:   Persistent atrial fibrillation Timing of onset unclear, baseline artifact on EKG December 09, 2022, unable to exclude atrial fibrillation at that time when he had for stroke -EKG this admission showing atrial fibrillation, not on anticoagulation Rate relatively well-controlled not on rate controlling agents -Long discussion with patient and daughter on the phone, both are concerned with starting anticoagulation and risk of bleeding, nosebleeds -Reports long history of epistaxis, previously seen by ENT and has required cauterization in the past -On further discussion, he is willing to start Eliquis 5 twice daily and we will arrange follow-up with Dr. Lalla Brothers for consideration of Watchman device -For severe epistaxis, we have recommended he stop Eliquis and call our office -We have detailed to the patient and family that he is not a candidate for cardioversion or restoring normal sinus rhythm as he has not been on anticoagulation  Coronary artery disease with stable angina History of CABG Denies chest pain concerning for angina No plan for ischemic workup at this time On Lipitor 80  Essential hypertension Blood pressure 150 up to 160 systolic As outpatient takes losartan 100 daily, hydralazine 50 twice daily These are on hold at this time  Hyperlipidemia On Lipitor 80 daily, fenofibrate  Diabetes type 2 On insulin  Chronic kidney disease stage III As outpatient on torsemide and losartan  Chronic diastolic CHF Ejection fraction 55 to 60% in January 2024 Appears relatively euvolemic Would  start torsemide 20 daily, outpatient dose, at discharge   Total encounter time more than 80 minutes  Greater than 50% was spent in counseling and coordination of care  with the patient   For questions or updates, please contact [facility] Please consult [email or web address] for contact info under    Signed, Julien Nordmann, MD  04/07/2023 4:30 PM

## 2023-04-07 NOTE — Progress Notes (Signed)
Progress Note   Patient: Joseph [name]:[id number] DOB: [birth year] DOA: 04/06/2023     1 DOS: the patient was seen and examined on 04/07/2023     Subjective:  Patient seen and examined at bedside this morning in the presence of the daughter Patient also working with physical therapist Denies chest pain nausea vomiting abdominal pain or shortness of breath Found to have new onset A-fib for which cardiologist have recommended Eliquis.  Brief hospital course:  HPI by Dr. Clyde [name] "Joseph [name] is a 89 y.o. male with medical history significant of stroke with right-sided weakness 11/2022, HTN, HLD, DM, CAD, CABG, dCHF, hypothyroidism, CKD-3A, who presents with generalized weakness and mild left facial droop.    Patient states he started feeling generalized weak since yesterday morning, denies unilateral numbness or tingling in extremities.  He was noted to have mild left facial droop, being slower in speaking per caregiver at the bedside.  No vision loss.  Patient has chronic hard of hearing, which has not changed.  Denies chest pain, cough, shortness of breath.  No nausea, vomiting, diarrhea or abdominal pain.  No symptoms of UTI.  No fever or chills.   Pt is  found to have new onset atrial fibrillation with HR 40-50s in ED.   Data reviewed independently and ED Course: pt was found to have WBC 6.6, worsening renal function, negative urinalysis, temperature normal, blood pressure 181/66, 135/70, RR 21, oxygen sat 97% on room air.  CT of head negative. MRI showed acute stroke.  Patient is admitted to telemetry bed as inpatient.  Dr. Gala [name] of cardiology and Dr. Delice [name] of neurology are consulted.   MRI-brain: Acute infarct in the right posterior limb of the internal capsule and corona radiata.  "  Assessment and Plan: Assessment/Plan Principal Problem:   Stroke [facility]) Active Problems:   New onset atrial fibrillation (HCC)   Essential hypertension   Coronary artery disease   Type II  diabetes mellitus with renal manifestations (HCC)   Chronic kidney disease, stage 3a (HCC)   Mixed hyperlipidemia   Hypothyroidism   COPD (chronic obstructive pulmonary disease) (HCC)   Chronic diastolic CHF (congestive heart failure) (HCC)   Obesity (BMI 30-39.9)     Assessment and Plan:     Acute ischemic stroke [facility]): Patient has history of stroke 1/24, he had mild right-sided weakness, which which seems to have resolved.  Now presents with mild left facial droop and generalized weakness.  MRI of brain showed acute infarct in the right posterior limb of the internal capsule and corona radiata.  Pt is found to have new onset A fib with slow heart rate.   Plan  neurologist on board we appreciate input- Continue management under hospitalist service - CTA of  head and neck negative for large vessel occlusion - will hold oral Bp meds to allow permissive HTN in the setting of acute stroke for the first 24 hours -Continue aspirin and Plavix -Neurologist recommends discontinuation of statin therapy as patient's LDL is within normal limits -A1c 6.9 -Follow-up on echocardiogram -Follow-up on speech eval - PT/OT consult   New onset atrial fibrillation [facility]): CHA2DS2-VASc Score is 7,  Heart rate is slow in 40-50s. -Cardiology on board -Patient's family have agreed for [place] -TSH 1.96   Hyperlipidemia, mixed -Lipitor and fenofibrate   Essential hypertension -holding home blood pressure medications to allow permissive hypertension for the next 24 hours - prn IV hydralazine for SBP>220 or dBP>110   Coronary artery disease: s/p of  CABG. No chest pain -Lipitor and ASA   Type II diabetes mellitus with renal manifestations [facility]):: Recent A1c 7.4, poorly controlled.  Patient is taking NovoLog and Lantus 40 units daily -SSI -Glargine insulin 30 units daily   Chronic kidney disease, stage 3a (HCC): Renal function is improved  recent baseline creatinine 1.08 on 12/14/2022.   -Cozaar is on hold  due to initial worsening of renal function -Will hold torsemide   Hypothyroidism -Hypothyroidism   COPD (chronic obstructive pulmonary disease) (HCC): Stable -As needed bronchodilators   Chronic diastolic CHF (congestive heart failure) (HCC): 2D echo 12/11/2022 showed EF of 55 to 60% with grade 1 diastolic dysfunction.  Patient has trace leg edema, but no JVD, no shortness of breath.  CHF currently compensated -Hold torsemide due to worsening renal function  BNP 97.9   Obesity (BMI 30-39.9): Body weight 94.3 kg, BMI 32.58 -Healthy diet and exercise -Encourage losing weight    DVT ppx: Eliquis   Code Status: Full code per pt   Family Communication:   Yes, patient's son and caregiver at bed side.       Disposition Plan:  Anticipate discharge back to previous environment   Consults called:  Dr. Gala [name] of cardiology and Dr. Wilford [name] of neurology are consulted.     Physical Exam: Vitals:   04/07/23 0058 04/07/23 0453 04/07/23 0843 04/07/23 1112  BP: (!) 156/53 (!) 151/55 (!) 160/78 (!) 157/87  Pulse: (!) 57 (!) 50 71 (!) 56  Resp: 20 16 18 18   Temp: 97.7 F (36.5 C) 98.8 F (37.1 C) 97.9 F (36.6 C) 98 F (36.7 C)  TempSrc:      SpO2: 98% 96% 98%   Weight:      Height:       General: Not in acute distress HEENT:       Eyes: PERRL, EOMI, no scleral icterus.       ENT: No discharge from the ears and nose, no pharynx injection, no tonsillar enlargement.        Neck: No JVD, no bruit, no mass felt. Heme: No neck lymph node enlargement. Cardiac: S1/S2, RRR, No murmurs, No gallops or rubs. Respiratory: No rales, wheezing, rhonchi or rubs. GI: Soft, nondistended, nontender, no rebound pain, no organomegaly, BS present. GU: No hematuria Ext: No pitting leg edema bilaterally. 1+DP/PT pulse bilaterally. Musculoskeletal: No joint deformities, No joint redness or warmth, no limitation of ROM in spin. Skin: No rashes.  Neuro: Alert, oriented X3, cranial nerves II-XII grossly  intact except for mild left facial droop.  On my examination, patient muscle strength is normal in all extremities, 5/5. Psych: Patient is not psychotic, no suicidal or hemocidal ideation.   Data Reviewed: I have reviewed patient's laboratory results showing sodium 139 potassium 3.6 creatinine 1.0  Family Communication: Discussed with patient's daughter as well as caretaker at bedside  Disposition: Status is: Inpatient   Time spent: 42 minutes  Author: Loyce Dys, MD 04/07/2023 2:13 PM  For on call review [email or web address].

## 2023-04-07 NOTE — Evaluation (Signed)
Occupational Therapy Evaluation Patient Details Name: Joseph [name] MRN: [id number] DOB: 1925-04-18 Today's Date: 04/07/2023   History of Present Illness Joseph [name] is a 89 y.o. male with medical history significant of stroke with right-sided weakness 11/2022, HTN, HLD, DM, CAD, CABG, dCHF, hypothyroidism, CKD-3A, who presents with generalized weakness and mild left facial droop.   Clinical Impression   Pt presents with generalized weakness, limited endurance, and impaired balance. Following a stroke in Jan 2024, pt has had caregivers at home 4 hours every morning and 5 hours every evening, assisting with IADLs, w/ pt able to complete most BADLs w/ SUPV. During today's evaluation, pt is Mod I w/ bed mobility, is able to ambulate to/from bathroom w/ RW and CGA, requires Min A for toileting, close SUPV for safety during all OOB mobility. Pt demonstrates poor proprioception and motor control with RLE during ambulation, dysdiadochokinesia w/ R UE although no appreciable difference in strength or ROM between b/l UE. Pt will require increased caregiving support, anticipate he could return to home setting if AM and PM attendants are available, although pt could benefit from continued STR in a skilled setting.    Recommendations for follow up therapy are one component of a multi-disciplinary discharge planning process, led by the attending physician.  Recommendations may be updated based on patient status, additional functional criteria and insurance authorization.   Assistance Recommended at Discharge Intermittent Supervision/Assistance  Patient can return home with the following A little help with walking and/or transfers;A little help with bathing/dressing/bathroom;Assistance with cooking/housework;Help with stairs or ramp for entrance;Assist for transportation    Functional Status Assessment  Patient has had a recent decline in their functional status and demonstrates the ability to make  significant improvements in function in a reasonable and predictable amount of time.  Equipment Recommendations  None recommended by OT    Recommendations for Other Services       Precautions / Restrictions Precautions Precautions: Fall Restrictions Weight Bearing Restrictions: No      Mobility Bed Mobility Overal bed mobility: Modified Independent             General bed mobility comments: with bed features    Transfers Overall transfer level: Needs assistance Equipment used: Rolling walker (2 wheels) Transfers: Sit to/from Stand, Bed to chair/wheelchair/BSC Sit to Stand: Min guard, From elevated surface     Step pivot transfers: Min guard            Balance Overall balance assessment: Needs assistance Sitting-balance support: Bilateral upper extremity supported, Feet supported Sitting balance-Leahy Scale: Good     Standing balance support: Bilateral upper extremity supported, During functional activity Standing balance-Leahy Scale: Fair Standing balance comment: relies on RW at baseline                           ADL either performed or assessed with clinical judgement   ADL Overall ADL's : Needs assistance/impaired     Grooming: Wash/dry hands;Standing;Supervision/safety                   Toilet Transfer: Rolling walker (2 wheels);Regular Toilet;Minimal assistance Toilet Transfer Details (indicate cue type and reason): Min A to come into standing from low toilet Toileting- Clothing Manipulation and Hygiene: Min guard;Sit to/from stand       Functional mobility during ADLs: Rolling walker (2 wheels);Minimal assistance General ADL Comments: Min A and close SUPV for safety w/ OOB mobility     Vision  Perception     Praxis      Pertinent Vitals/Pain Pain Assessment Pain Assessment: No/denies pain     Hand Dominance Right   Extremity/Trunk Assessment Upper Extremity Assessment Upper Extremity Assessment: RUE  deficits/detail RUE Deficits / Details: Dysmetria with FTN anticipate due to residual deficit from prior CVA   Lower Extremity Assessment Lower Extremity Assessment: Generalized weakness;RLE deficits/detail RLE Deficits / Details: generally with poor proprioception/motor control with gait in swing phase but symmetric strength . Anticipate this is residual deficit from prior CVA.       Communication Communication Communication: No difficulties;HOH   Cognition Arousal/Alertness: Awake/alert Behavior During Therapy: WFL for tasks assessed/performed Overall Cognitive Status: Within Functional Limits for tasks assessed                                       General Comments       Exercises     Shoulder Instructions      Home Living Family/patient expects to be discharged to:: Private residence Living Arrangements: Alone Available Help at Discharge: Personal care attendant Type of Home: House Home Access: Ramped entrance     Home Layout: One level     Bathroom Shower/Tub: Chief Strategy Officer: Standard     Home Equipment: Pharmacist, hospital (2 wheels);Rollator (4 wheels);Transport chair;Cane - single point;Hand held shower head;Grab bars - tub/shower   Additional Comments: PCA 6 days/week 8:30-12:30 then 5:30 - 10:30 pm. uses BSC next to bed at night time when alone. Daughter reports pt will not have night time PCA's any longer due to personal matters/concerns. Attempting to find replacements.      Prior Functioning/Environment Prior Level of Function : Needs assist       Physical Assist : ADLs (physical)   ADLs (physical): Dressing;IADLs Mobility Comments: Mod- w/ RW prior to CVA in Jan 2024 ADLs Comments: Assist for LB dressing        OT Problem List: Decreased strength;Decreased activity tolerance;Impaired balance (sitting and/or standing)      OT Treatment/Interventions: Self-care/ADL training;Therapeutic  exercise;Patient/family education;Energy conservation;DME and/or AE instruction;Therapeutic activities;Balance training    OT Goals(Current goals can be found in the care plan section) Acute Rehab OT Goals Patient Stated Goal: to go home OT Goal Formulation: With patient Time For Goal Achievement: 04/21/23 Potential to Achieve Goals: Good ADL Goals Pt Will Perform Grooming: with supervision;standing Pt Will Perform Lower Body Dressing: with min assist;sit to/from stand Pt Will Transfer to Toilet: with supervision;regular height toilet;ambulating  OT Frequency: Min 3X/week    Co-evaluation              AM-PAC OT "6 Clicks" Daily Activity     Outcome Measure Help from another person eating meals?: None Help from another person taking care of personal grooming?: A Little Help from another person toileting, which includes using toliet, bedpan, or urinal?: A Little Help from another person bathing (including washing, rinsing, drying)?: A Lot Help from another person to put on and taking off regular upper body clothing?: A Little Help from another person to put on and taking off regular lower body clothing?: A Lot 6 Click Score: 17   End of Session Equipment Utilized During Treatment: Rolling walker (2 wheels)  Activity Tolerance: Patient tolerated treatment well Patient left: in chair;with call bell/phone within reach;with family/visitor present  OT Visit Diagnosis: Unsteadiness on feet (R26.81);Muscle weakness (generalized) (M62.81)  Time: 1010-1035 OT Time Calculation (min): 25 min Charges:  OT General Charges $OT Visit: 1 Visit OT Evaluation $OT Eval Low Complexity: 1 Low OT Treatments $Self Care/Home Management : 23-37 mins Latina Craver, PhD, MS, OTR/L 04/07/23, 1:09 PM

## 2023-04-07 NOTE — Telephone Encounter (Addendum)
Referral placed for EP and message sent to scheduling to arrange date and time.    ----- Message from Antonieta Iba, MD sent at 04/07/2023  4:51 PM EDT ----- Can we place referral to EP for consideration of Watchman device Recent stroke x 2, reluctant to start Eliquis given frequent nosebleeds Atrial fibrillation documented Willing to take Eliquis for short time for bridging until decision made concerning Watchman Thx TGollan

## 2023-04-07 NOTE — Progress Notes (Signed)
Concern during ST assessment for possible weakness of rt arm worse than baseline and left neglect. I examined him. Essentially unchanged since my AM exam but given concern earlier, will get [facility] to r/o acute process. Also met family. Answered questions. They report sleep issues. Recommended trial of Melatonin 3mg  qhs for a few nights. Can dc melatonin if not deemed effective per patient choice. Family amenable. CTH -expected evolution of new right BG stroke - no new findings. Neurology will be available as needed. Please call with questions.   -- Milon Dikes, MD Neurologist [facility] Pager: [phone]

## 2023-04-07 NOTE — Telephone Encounter (Signed)
Pt c/o medication issue:  1. Name of Medication: Eliquis  2. How are you currently taking this medication (dosage and times per day)?   3. Are you having a reaction (difficulty breathing--STAT)? No  4. What is your medication issue? Patient's daughter is calling because the patient is currently at the hospital. Patient's daughter stated that the patient will have random nose bleeds and the provider at the hospital would like the patient to start taking Eliquis. Patient's daughter stated that she doesn't want the patient to start taking Eliquis with out Dr. Windell Hummingbird recommendation. Patient's daughter stated that the patient now has developed AFIB. Please advise.

## 2023-04-08 DIAGNOSIS — I5041 Acute combined systolic (congestive) and diastolic (congestive) heart failure: Secondary | ICD-10-CM | POA: Diagnosis not present

## 2023-04-08 DIAGNOSIS — I4891 Unspecified atrial fibrillation: Secondary | ICD-10-CM | POA: Diagnosis not present

## 2023-04-08 DIAGNOSIS — I10 Essential (primary) hypertension: Secondary | ICD-10-CM | POA: Diagnosis not present

## 2023-04-08 DIAGNOSIS — I639 Cerebral infarction, unspecified: Secondary | ICD-10-CM | POA: Diagnosis not present

## 2023-04-08 LAB — CBC WITH DIFFERENTIAL/PLATELET
Abs Immature Granulocytes: 0.01 10*3/uL (ref 0.00–0.07)
Basophils Absolute: 0.1 10*3/uL (ref 0.0–0.1)
Basophils Relative: 2 %
Eosinophils Absolute: 0.4 10*3/uL (ref 0.0–0.5)
Eosinophils Relative: 8 %
HCT: 35.5 % — ABNORMAL LOW (ref 39.0–52.0)
Hemoglobin: 11.9 g/dL — ABNORMAL LOW (ref 13.0–17.0)
Immature Granulocytes: 0 %
Lymphocytes Relative: 42 %
Lymphs Abs: 2.3 10*3/uL (ref 0.7–4.0)
MCH: 30.1 pg (ref 26.0–34.0)
MCHC: 33.5 g/dL (ref 30.0–36.0)
MCV: 89.6 fL (ref 80.0–100.0)
Monocytes Absolute: 0.6 10*3/uL (ref 0.1–1.0)
Monocytes Relative: 10 %
Neutro Abs: 2.1 10*3/uL (ref 1.7–7.7)
Neutrophils Relative %: 38 %
Platelets: 232 10*3/uL (ref 150–400)
RBC: 3.96 MIL/uL — ABNORMAL LOW (ref 4.22–5.81)
RDW: 13.7 % (ref 11.5–15.5)
WBC: 5.5 10*3/uL (ref 4.0–10.5)
nRBC: 0 % (ref 0.0–0.2)

## 2023-04-08 LAB — GLUCOSE, CAPILLARY
Glucose-Capillary: 142 mg/dL — ABNORMAL HIGH (ref 70–99)
Glucose-Capillary: 218 mg/dL — ABNORMAL HIGH (ref 70–99)
Glucose-Capillary: 238 mg/dL — ABNORMAL HIGH (ref 70–99)

## 2023-04-08 LAB — BASIC METABOLIC PANEL
Anion gap: 6 (ref 5–15)
BUN: 16 mg/dL (ref 8–23)
CO2: 23 mmol/L (ref 22–32)
Calcium: 8.6 mg/dL — ABNORMAL LOW (ref 8.9–10.3)
Chloride: 111 mmol/L (ref 98–111)
Creatinine, Ser: 1.17 mg/dL (ref 0.61–1.24)
GFR, Estimated: 57 mL/min — ABNORMAL LOW (ref >60)
Glucose, Bld: 126 mg/dL — ABNORMAL HIGH (ref 70–99)
Potassium: 3.4 mmol/L — ABNORMAL LOW (ref 3.5–5.1)
Sodium: 140 mmol/L (ref 135–145)

## 2023-04-08 MED ORDER — TRAZODONE HCL 50 MG PO TABS
25.0000 mg | ORAL_TABLET | Freq: Once | ORAL | Status: AC
  Administered 2023-04-09: 25 mg via ORAL
  Filled 2023-04-08: qty 1

## 2023-04-08 MED ORDER — HYDRALAZINE HCL 50 MG PO TABS
50.0000 mg | ORAL_TABLET | Freq: Three times a day (TID) | ORAL | Status: DC
  Administered 2023-04-08 – 2023-04-11 (×9): 50 mg via ORAL
  Filled 2023-04-08 (×10): qty 1

## 2023-04-08 MED ORDER — POTASSIUM CHLORIDE 20 MEQ PO PACK
40.0000 meq | PACK | Freq: Once | ORAL | Status: AC
  Administered 2023-04-08: 40 meq via ORAL
  Filled 2023-04-08: qty 2

## 2023-04-08 NOTE — Progress Notes (Signed)
Occupational Therapy Treatment Patient Details Name: Joseph [name] MRN: [id number] DOB: October 22, 1925 Today's Date: 04/08/2023   History of present illness Joseph [name] is a 89 y.o. male with medical history significant of stroke with right-sided weakness 11/2022, HTN, HLD, DM, CAD, CABG, dCHF, hypothyroidism, CKD-3A, who presents with generalized weakness and mild left facial droop.   OT comments  [name] was seen for OT treatment on this date. Upon arrival to room pt reclined in bed, family at bed side, agreeable to tx. Pt requires  MIN A + RW for ADL t/f, tolerates ~80 ft x2, increased assist as pt fatigues. MAX A for LB access seated EOB. Pt making good progress toward goals, will continue to follow POC. Discharge recommendation remains appropriate.     Recommendations for follow up therapy are one component of a multi-disciplinary discharge planning process, led by the attending physician.  Recommendations may be updated based on patient status, additional functional criteria and insurance authorization.    Assistance Recommended at Discharge Intermittent Supervision/Assistance  Patient can return home with the following  A little help with walking and/or transfers;A little help with bathing/dressing/bathroom;Assistance with cooking/housework;Help with stairs or ramp for entrance;Assist for transportation   Equipment Recommendations  None recommended by OT    Recommendations for Other Services      Precautions / Restrictions Precautions Precautions: Fall Restrictions Weight Bearing Restrictions: No       Mobility Bed Mobility Overal bed mobility: Needs Assistance Bed Mobility: Supine to Sit     Supine to sit: Min assist          Transfers Overall transfer level: Needs assistance Equipment used: Rolling walker (2 wheels) Transfers: Sit to/from Stand Sit to Stand: Min assist           General transfer comment: posterior lean     Balance Overall  balance assessment: Needs assistance Sitting-balance support: Feet supported, No upper extremity supported Sitting balance-Leahy Scale: Good     Standing balance support: Bilateral upper extremity supported Standing balance-Leahy Scale: Fair                             ADL either performed or assessed with clinical judgement   ADL Overall ADL's : Needs assistance/impaired                                       General ADL Comments: MIN A + RW for ADL t/f, tolerates ~80 ft x2, increased assist as pt fatigues. MAX A for LB access seated EOB.      Cognition Arousal/Alertness: Awake/alert Behavior During Therapy: WFL for tasks assessed/performed Overall Cognitive Status: Within Functional Limits for tasks assessed                                                     Pertinent Vitals/ Pain       Pain Assessment Pain Assessment: No/denies pain   Frequency  Min 3X/week        Progress Toward Goals  OT Goals(current goals can now be found in the care plan section)  Progress towards OT goals: Progressing toward goals  Acute Rehab OT Goals Patient Stated Goal: to go home OT Goal Formulation: With  patient/family Time For Goal Achievement: 04/21/23 Potential to Achieve Goals: Good ADL Goals Pt Will Perform Grooming: with supervision;standing Pt Will Perform Lower Body Dressing: with min assist;sit to/from stand Pt Will Transfer to Toilet: with supervision;regular height toilet;ambulating  Plan Discharge plan remains appropriate;Frequency remains appropriate    Co-evaluation                 AM-PAC OT "6 Clicks" Daily Activity     Outcome Measure   Help from another person eating meals?: None Help from another person taking care of personal grooming?: A Little Help from another person toileting, which includes using toliet, bedpan, or urinal?: A Little Help from another person bathing (including washing, rinsing,  drying)?: A Lot Help from another person to put on and taking off regular upper body clothing?: A Little Help from another person to put on and taking off regular lower body clothing?: A Lot 6 Click Score: 17    End of Session Equipment Utilized During Treatment: Gait belt;Rolling walker (2 wheels)  OT Visit Diagnosis: Unsteadiness on feet (R26.81);Muscle weakness (generalized) (M62.81)   Activity Tolerance Patient tolerated treatment well   Patient Left in chair;with call bell/phone within reach;with family/visitor present   Nurse Communication          Time: 1610-9604 OT Time Calculation (min): 27 min  Charges: OT General Charges $OT Visit: 1 Visit OT Treatments $Self Care/Home Management : 23-37 mins  Kathie Dike, M.S. OTR/L  04/08/23, 3:59 PM  ascom [phone]

## 2023-04-08 NOTE — Plan of Care (Signed)

## 2023-04-08 NOTE — Progress Notes (Signed)
Called to patient's room by patient and daughter. Patient complaining about removal of puriwick. Patient does not qualify for puriwick and is planning to d/c to rehab. Pt and daughter educated on use of puriwick and need to increase independence (with assist of staff) as part of qualifying for rehab. Order placed by MD for no puriwick.

## 2023-04-08 NOTE — Progress Notes (Signed)
Rounding Note    Patient Name: Joseph [name] Date of Encounter: 04/08/2023  [facility] Cardiologist: Julien Nordmann, MD   Subjective   Feeling tired.  Denies chest pain or shortness of breath.  Ambulated yesterday and felt well.  Inpatient Medications    Scheduled Meds:  apixaban  5 mg Oral BID   fenofibrate  54 mg Oral Daily   insulin aspart  0-5 Units Subcutaneous QHS   insulin aspart  0-9 Units Subcutaneous TID WC   insulin glargine-yfgn  30 Units Subcutaneous Daily   levothyroxine  50 mcg Oral QAC breakfast   magnesium oxide  200 mg Oral Daily   melatonin  5 mg Oral QHS   pantoprazole  40 mg Oral Daily   Continuous Infusions:  PRN Meds: acetaminophen **OR** acetaminophen (TYLENOL) oral liquid 160 mg/5 mL **OR** acetaminophen, albuterol, dextromethorphan-guaiFENesin, hydrALAZINE, nitroGLYCERIN, ondansetron (ZOFRAN) IV, senna-docusate   Vital Signs    Vitals:   04/07/23 2000 04/07/23 2359 04/08/23 0401 04/08/23 0750  BP: (!) 176/59 (!) 149/63 (!) 153/62 (!) 160/58  Pulse: 60 (!) 53 (!) 58 (!) 55  Resp:  16 16 18   Temp: 98.3 F (36.8 C) 98.5 F (36.9 C) 98.6 F (37 C) 98.3 F (36.8 C)  TempSrc: Oral     SpO2: 96% 95% 96% 95%  Weight:      Height:        Intake/Output Summary (Last 24 hours) at 04/08/2023 0957 Last data filed at 04/07/2023 2000 Gross per 24 hour  Intake 580 ml  Output --  Net 580 ml      04/06/2023   12:02 PM 01/13/2023   10:16 AM 01/12/2023   11:24 AM  Last 3 Weights  Weight (lbs) 208 lb 208 lb 8 oz 208 lb 12.8 oz  Weight (kg) 94.348 kg 94.575 kg 94.711 kg      Telemetry    Atrial fibrillation.  Rate 40s to 60s.- Personally Reviewed  ECG    N/a - Personally Reviewed  Physical Exam   VS:  BP (!) 160/58 (BP Location: Left Arm)   Pulse (!) 55   Temp 98.3 F (36.8 C)   Resp 18   Ht 5\' 7"  (1.702 m)   Wt 94.3 kg   SpO2 95%   BMI 32.58 kg/m  , BMI Body mass index is 32.58 kg/m. GENERAL:  Well appearing.  No  acute distress HEENT: Pupils equal round and reactive, fundi not visualized, oral mucosa unremarkable NECK:  No jugular venous distention, waveform within normal limits, carotid upstroke brisk and symmetric, no bruits, no thyromegaly LUNGS:  Clear to auscultation bilaterally HEART: Irregularly irregular PMI not displaced or sustained,S1 and S2 within normal limits, no S3, no S4, no clicks, no rubs, 2/6 systolic murmur at the left upper sternal border ABD:  Flat, positive bowel sounds normal in frequency in pitch, no bruits, no rebound, no guarding, no midline pulsatile mass, no hepatomegaly, no splenomegaly EXT:  2 plus pulses throughout, no edema, no cyanosis no clubbing SKIN:  No rashes no nodules NEURO:  Cranial nerves II through XII grossly intact, motor grossly intact throughout PSYCH:  Cognitively intact, oriented to person place and time    Labs    High Sensitivity Troponin:   Recent Labs  Lab 04/06/23 1205 04/06/23 1206  TROPONINIHS 14 14     Chemistry Recent Labs  Lab 04/06/23 1206 04/07/23 0953 04/08/23 0408  NA 137 139 140  K 3.6 3.6 3.4*  CL 105 110  111  CO2 24 23 23   GLUCOSE 240* 168* 126*  BUN 19 16 16   CREATININE 1.32* 1.09 1.17  CALCIUM 8.7* 8.7* 8.6*  PROT 6.3*  --   --   ALBUMIN 3.3*  --   --   AST 30  --   --   ALT 15  --   --   ALKPHOS 39  --   --   BILITOT 1.0  --   --   GFRNONAA 50* >60 57*  ANIONGAP 8 6 6     Lipids  Recent Labs  Lab 04/07/23 0410  CHOL 126  TRIG 138  HDL 28*  LDLCALC 70  CHOLHDL 4.5    Hematology Recent Labs  Lab 04/06/23 1206 04/07/23 0953 04/08/23 0408  WBC 6.6 4.8 5.5  RBC 4.26 4.30 3.96*  HGB 12.9* 12.8* 11.9*  HCT 38.7* 38.4* 35.5*  MCV 90.8 89.3 89.6  MCH 30.3 29.8 30.1  MCHC 33.3 33.3 33.5  RDW 14.1 13.9 13.7  PLT 256 237 232   Thyroid  Recent Labs  Lab 04/06/23 1206  TSH 1.962    BNP Recent Labs  Lab 04/06/23 1206  BNP 97.9    DDimer No results for input(s): "DDIMER" in the last 168  hours.   Radiology    ECHOCARDIOGRAM COMPLETE  Result Date: 04/07/2023    ECHOCARDIOGRAM REPORT   Patient Name:   Joseph [name] Date of Exam: 04/07/2023 Medical Rec #:  [id number]         Height:       67.0 in Accession #:    [id number]        Weight:       208.0 lb Date of Birth:  1925/09/26        BSA:          2.056 m Patient Age:    89 years          BP:           151/55 mmHg Patient Gender: M                 HR:           54 bpm. Exam Location:  [facility] Procedure: 2D Echo, Cardiac Doppler, Color Doppler and Intracardiac            Opacification Agent Indications:     Sroke  History:         Patient has prior history of Echocardiogram examinations, most                  recent 12/11/2022. CHF, CAD and Previous Myocardial Infarction,                  Prior CABG, Stroke and COPD, Arrythmias:Atrial Fibrillation and                  Bradycardia, Signs/Symptoms:Edema, Dyspnea, Fatigue and                  Shortness of Breath; Risk Factors:Hypertension, Diabetes and                  Dyslipidemia.  Sonographer:     Mikki Harbor Referring Phys:  [id number] Lorretta Harp Diagnosing Phys: Julien Nordmann MD  Sonographer Comments: Image acquisition challenging due to COPD. IMPRESSIONS  1. Left ventricular ejection fraction, by estimation, is 45 to 50%. The left ventricle has mildly decreased function. The left ventricle has no regional wall motion abnormalities. Left ventricular diastolic parameters  are indeterminate.  2. Right ventricular systolic function is normal. The right ventricular size is normal. There is normal pulmonary artery systolic pressure.  3. Left atrial size was moderately dilated.  4. The mitral valve is normal in structure. Mild to moderate mitral valve regurgitation. No evidence of mitral stenosis.  5. The aortic valve is normal in structure. Aortic valve regurgitation is not visualized. Aortic valve sclerosis/calcification is present, without any evidence of aortic stenosis. Aortic valve mean gradient  measures 8.0 mmHg.  6. There is borderline dilatation of the ascending aorta, measuring 38 mm.  7. The inferior vena cava is normal in size with greater than 50% respiratory variability, suggesting right atrial pressure of 3 mmHg. FINDINGS  Left Ventricle: Left ventricular ejection fraction, by estimation, is 45 to 50%. The left ventricle has mildly decreased function. The left ventricle has no regional wall motion abnormalities. Definity contrast agent was given IV to delineate the left ventricular endocardial borders. The left ventricular internal cavity size was normal in size. There is no left ventricular hypertrophy. Left ventricular diastolic parameters are indeterminate. Right Ventricle: The right ventricular size is normal. No increase in right ventricular wall thickness. Right ventricular systolic function is normal. There is normal pulmonary artery systolic pressure. The tricuspid regurgitant velocity is 1.84 m/s, and  with an assumed right atrial pressure of 3 mmHg, the estimated right ventricular systolic pressure is 16.5 mmHg. Left Atrium: Left atrial size was moderately dilated. Right Atrium: Right atrial size was normal in size. Pericardium: There is no evidence of pericardial effusion. Mitral Valve: The mitral valve is normal in structure. Mild to moderate mitral valve regurgitation. No evidence of mitral valve stenosis. MV peak gradient, 3.7 mmHg. The mean mitral valve gradient is 1.0 mmHg. Tricuspid Valve: The tricuspid valve is normal in structure. Tricuspid valve regurgitation is not demonstrated. No evidence of tricuspid stenosis. Aortic Valve: The aortic valve is normal in structure. Aortic valve regurgitation is not visualized. Aortic valve sclerosis/calcification is present, without any evidence of aortic stenosis. Aortic valve mean gradient measures 8.0 mmHg. Aortic valve peak  gradient measures 16.3 mmHg. Aortic valve area, by VTI measures 1.61 cm. Pulmonic Valve: The pulmonic valve was  normal in structure. Pulmonic valve regurgitation is not visualized. No evidence of pulmonic stenosis. Aorta: The aortic root is normal in size and structure. There is borderline dilatation of the ascending aorta, measuring 38 mm. Venous: The inferior vena cava is normal in size with greater than 50% respiratory variability, suggesting right atrial pressure of 3 mmHg. IAS/Shunts: No atrial level shunt detected by color flow Doppler.  LEFT VENTRICLE PLAX 2D LVIDd:         5.30 cm   Diastology LVIDs:         3.40 cm   LV e' medial:    6.20 cm/s LV PW:         1.40 cm   LV E/e' medial:  12.1 LV IVS:        1.30 cm   LV e' lateral:   9.46 cm/s LVOT diam:     2.10 cm   LV E/e' lateral: 7.9 LV SV:         79 LV SV Index:   39 LVOT Area:     3.46 cm  RIGHT VENTRICLE RV Basal diam:  3.60 cm RV Mid diam:    3.30 cm RV S prime:     7.83 cm/s TAPSE (M-mode): 1.8 cm LEFT ATRIUM  Index        RIGHT ATRIUM           Index LA diam:        5.40 cm 2.63 cm/m   RA Area:     17.00 cm LA Vol (A2C):   72.3 ml 35.16 ml/m  RA Volume:   40.10 ml  19.50 ml/m LA Vol (A4C):   52.6 ml 25.58 ml/m LA Biplane Vol: 62.8 ml 30.54 ml/m  AORTIC VALVE                     PULMONIC VALVE AV Area (Vmax):    1.78 cm      PV Vmax:       1.18 m/s AV Area (Vmean):   1.62 cm      PV Peak grad:  5.6 mmHg AV Area (VTI):     1.61 cm AV Vmax:           202.00 cm/s AV Vmean:          133.000 cm/s AV VTI:            0.493 m AV Peak Grad:      16.3 mmHg AV Mean Grad:      8.0 mmHg LVOT Vmax:         104.00 cm/s LVOT Vmean:        62.100 cm/s LVOT VTI:          0.229 m LVOT/AV VTI ratio: 0.46  AORTA Ao Root diam: 4.30 cm Ao Asc diam:  3.80 cm MITRAL VALVE               TRICUSPID VALVE MV Area (PHT): 2.83 cm    TR Peak grad:   13.5 mmHg MV Area VTI:   2.48 cm    TR Vmax:        184.00 cm/s MV Peak grad:  3.7 mmHg MV Mean grad:  1.0 mmHg    SHUNTS MV Vmax:       0.96 m/s    Systemic VTI:  0.23 m MV Vmean:      50.6 cm/s   Systemic Diam: 2.10 cm  MV Decel Time: 268 msec MV E velocity: 74.90 cm/s MV A velocity: 95.90 cm/s MV E/A ratio:  0.78 Julien Nordmann MD Electronically signed by Julien Nordmann MD Signature Date/Time: 04/07/2023/6:01:01 PM    Final    CT HEAD WO CONTRAST ( )  Result Date: 04/07/2023 CLINICAL DATA:  Stroke, follow up EXAM: CT HEAD WITHOUT CONTRAST TECHNIQUE: Contiguous axial images were obtained from the base of the skull through the vertex without intravenous contrast. RADIATION DOSE REDUCTION: This exam was performed according to the departmental dose-optimization program which includes automated exposure control, adjustment of the mA and/or kV according to patient size and/or use of iterative reconstruction technique. COMPARISON:  MR Brain 04/06/23, CT head 04/06/23 FINDINGS: Brain: No hemorrhage. No hydrocephalus. There are prominent subarachnoid spaces bilaterally, unchanged. There is sequela of moderate chronic microvascular ischemic change with a chronic infarct in the left corona radiata and an acute infarct in the right basal ganglia, better characterized on recent brain MRI. Vascular: No hyperdense vessel or unexpected calcification. Skull: Normal. Negative for fracture or focal lesion. Sinuses/Orbits: No mastoid or middle ear effusion. Paranasal sinuses are clear. Bilateral lens replacement. Orbits are otherwise unremarkable. Other: None. IMPRESSION: 1. No acute intracranial hemorrhage. 2. Acute infarct in the right basal ganglia, better characterized on recent brain MRI. Electronically Signed   By: Sandria Senter  Celine Mans M.D.   On: 04/07/2023 16:08   CT ANGIO HEAD NECK W WO CM  Result Date: 04/06/2023 CLINICAL DATA:  Initial evaluation for neuro deficit, stroke. EXAM: CT ANGIOGRAPHY HEAD AND NECK WITH AND WITHOUT CONTRAST TECHNIQUE: Multidetector CT imaging of the head and neck was performed using the standard protocol during bolus administration of intravenous contrast. Multiplanar CT image reconstructions and MIPs were obtained  to evaluate the vascular anatomy. Carotid stenosis measurements (when applicable) are obtained utilizing NASCET criteria, using the distal internal carotid diameter as the denominator. RADIATION DOSE REDUCTION: This exam was performed according to the departmental dose-optimization program which includes automated exposure control, adjustment of the mA and/or kV according to patient size and/or use of iterative reconstruction technique. CONTRAST:  75mL OMNIPAQUE IOHEXOL 350 MG/ML SOLN COMPARISON:  MRI and CT from earlier the same day. FINDINGS: CTA NECK FINDINGS Aortic arch: Visualized aortic arch normal caliber with standard branch pattern. Moderate atheromatous change about the arch itself. No high-grade stenosis about the origin the great vessels. Right carotid system: Right common and internal carotid arteries are patent without dissection. Mild to moderate atheromatous change about the right carotid bulb without hemodynamically significant greater 50% stenosis. Left carotid system: Left common and internal carotid arteries are patent without dissection. Moderate atheromatous change about the left carotid bulb without hemodynamically significant greater than 50% stenosis. Vertebral arteries: Both vertebral arteries arise from subclavian arteries. No significant proximal subclavian artery stenosis. Atheromatous plaque at the origin of the right vertebral artery with associated severe stenosis (series 6, image 264). Vertebral arteries otherwise patent without significant stenosis or dissection. Skeleton: No discrete or worrisome osseous lesions. C6 and C7 vertebral bodies are ankylosed. Underlying moderate cervical spondylosis. Prior median sternotomy. Other neck: No other acute abnormality within the neck. Upper chest: Visualized upper chest demonstrates no other acute finding. Review of the MIP images confirms the above findings CTA HEAD FINDINGS Anterior circulation: Atheromatous change about the carotid  siphons with associated mild diffuse narrowing on the right with more moderate stenosis on the left. A1 segments patent bilaterally. No aneurysm. Left A1 hypoplastic, accounting for the mildly diminutive left ICA is compared to the right. Normal anterior communicating complex. Anterior cerebral arteries patent without stenosis. No M1 stenosis or occlusion. No proximal MCA branch occlusion or high-grade stenosis. Distal MCA branches grossly perfused and symmetric. Diffuse small vessel atheromatous irregularity noted. Posterior circulation: Atheromatous change about the proximal V4 segments bilaterally without hemodynamically significant stenosis. Both PICA grossly patent at their origins. Basilar patent without stenosis. Superior cerebral arteries patent bilaterally. Both PCAs primarily supplied via the basilar. Atheromatous change about the PCAs bilaterally. No significant stenosis on the left. Moderate proximal right P2 stenoses noted (series 9, image 21). Right PCA remains patent to its distal aspect. Venous sinuses: Not well assessed due to arterial timing the contrast bolus. Anatomic variants: As above.  No aneurysm. Review of the MIP images confirms the above findings IMPRESSION: 1. Negative CTA for large vessel occlusion or other emergent finding. 2. Moderate atheromatous change about the carotid siphons with associated mild to moderate stenosis, left worse than right. 3. Additional intracranial atheromatous disease with associated moderate proximal right P2 stenoses. Diffuse small vessel atheromatous irregularity. 4. Atheromatous plaque at the origin of the right vertebral artery with associated severe stenosis. Electronically Signed   By: Rise Mu M.D.   On: 04/06/2023 19:27   MR BRAIN WO CONTRAST  Result Date: 04/06/2023 CLINICAL DATA:  Generalized weakness, stroke suspected EXAM: MRI HEAD WITHOUT  CONTRAST TECHNIQUE: Multiplanar, multiecho pulse sequences of the brain and surrounding  structures were obtained without intravenous contrast. COMPARISON:  12/09/2022 MRI head, correlation is also made with same day CT head FINDINGS: Brain: Restricted diffusion with ADC correlate in the right posterior limb of the internal capsule and corona radiata (series 8, images 26), which measures up to 15 x 6 x 9 mm (AP x TR x CC) this is associated with increased T2 hyperintense signal. No acute hemorrhage, mass, mass effect, or midline shift. No hydrocephalus or extra-axial collection. Normal craniocervical junction. No hemosiderin deposition to suggest remote hemorrhage. Sequela of prior infarct in the left posterior limb of the internal capsule and corona radiata. Vascular: Normal arterial flow voids. Skull and upper cervical spine: Normal marrow signal. Sinuses/Orbits: Clear paranasal sinuses. No acute finding in the orbits. Status post bilateral lens replacements. Other: Trace fluid in right mastoid air cells. IMPRESSION: Acute infarct in the right posterior limb of the internal capsule and corona radiata. These results were called by telephone at the time of interpretation on 04/06/2023 at 4:39 pm to provider [facility] , who verbally acknowledged these results. Electronically Signed   By: Wiliam Ke M.D.   On: 04/06/2023 16:42   CT HEAD WO CONTRAST ( )  Result Date: 04/06/2023 CLINICAL DATA:  Neuro deficit, acute, stroke suspected. Additional history provided: Weakness, prior stroke. EXAM: CT HEAD WITHOUT CONTRAST TECHNIQUE: Contiguous axial images were obtained from the base of the skull through the vertex without intravenous contrast. RADIATION DOSE REDUCTION: This exam was performed according to the departmental dose-optimization program which includes automated exposure control, adjustment of the mA and/or kV according to patient size and/or use of iterative reconstruction technique. COMPARISON:  Non-contrast head CT 12/10/2022. Brain MRI 12/09/2022. FINDINGS: Brain: Known chronic infarct within  the left corona radiata/posterior limb of left internal capsule. There is no acute intracranial hemorrhage. No demarcated cortical infarct. No extra-axial fluid collection. No evidence of an intracranial mass. No midline shift. Vascular: No hyperdense vessel. Atherosclerotic calcifications. Skull: No fracture or aggressive osseous lesion. Sinuses/Orbits: No mass or acute finding within the imaged orbits. No significant paranasal sinus disease at the imaged levels. IMPRESSION: 1.  No evidence of an acute intracranial abnormality. 2. 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 Electronically Signed   By: Jackey Loge D.O.   On: 04/06/2023 13:09    Cardiac Studies   Echo 04/08/23: IMPRESSIONS    1. Left ventricular ejection fraction, by estimation, is 45 to 50%. The  left ventricle has mildly decreased function. The left ventricle has no  regional wall motion abnormalities. Left ventricular diastolic parameters  are indeterminate.   2. Right ventricular systolic function is normal. The right ventricular  size is normal. There is normal pulmonary artery systolic pressure.   3. Left atrial size was moderately dilated.   4. The mitral valve is normal in structure. Mild to moderate mitral valve  regurgitation. No evidence of mitral stenosis.   5. The aortic valve is normal in structure. Aortic valve regurgitation is  not visualized. Aortic valve sclerosis/calcification is present, without  any evidence of aortic stenosis. Aortic valve mean gradient measures 8.0  mmHg.   6. There is borderline dilatation of the ascending aorta, measuring 38  mm.   7. The inferior vena cava is normal in size with greater than 50%  respiratory variability, suggesting right atrial pressure of 3 mmHg.    Patient Profile     89 y.o. male  with CAD s/p CABG, CVA, HTN, carotid stenosis, HL, DM and hypothyroidism admitted with symptoms concerning for stroke.    Assessment  & Plan   # Persistent atrial fibrillation: Patient with recent stroke X2.  Concern for anticoagulation due to frequent nose bleeds.  Planning for outpatient Watchman evaluation.  Patient has been agreeable to starting Eliquis short tern.  Hold clopidogrel.  Thus far he has not had any epistaxis.  He is not on any nodal agents due to bradycardia at baseline.   # CVA:  Patient had a stroke 11/2022 and recurrent symptoms 03/2023.    # HTN: # Hypertensive emergency: BP remains elevated.  Presume that neurology wants to allow permissive HTN for now.  As BP can be more aggressively managed, recommend adding GDMT for heart failure,  HR too low for beta blocker.   # HFmrEF:  Echo this admission LVEF 45-50%.  Down from 55-60% 11/2022.  On my review there did seem to be some mild hypokinesis of the septal myocardium on the prior echo as well.  Nevertheless, it is worse now than during his prior hospitalization.  He denies any ischemic symptoms.  Would defer an ischemic evaluation at this time given his age and lack of symptoms.  Would recommend considering outpatient DCCV if he is able to tolerate anticoagulation for 3 weeks.  Repeat echo after maintaining sinus rhythm.  Once cleared by neurology for tighter blood pressure control, would recommend adding an ARB and spironolactone as renal function permit.  Would consider Jardiance or Marcelline Deist as well if okay with the primary team.  # CAD s/p CABG:  #Hyperlipidemia:  Continue atorvastatin and fenofibrate.  Holding clopidogrel as above due to concerns for epistaxis and the need to add Eliquis.  # Carotid stenosis: Mild.  Continue medical management.      For questions or updates, please contact [facility] Please consult [email or web address] for contact info under        Signed, Chilton Si, MD  04/08/2023, 9:57 AM

## 2023-04-08 NOTE — Progress Notes (Addendum)
Progress Note   Patient: Joseph [name]:[id number] DOB: 03-Sep-1925 DOA: 04/06/2023     2 DOS: the patient was seen and examined on 04/08/2023   Subjective:  Patient seen and examined at bedside this morning in the presence of the care taker. Denies chest pain nausea vomiting abdominal pain or shortness of breath  Brief hospital course:   HPI by Dr. Clyde [name] "Joseph [name] is a 89 y.o. male with medical history significant of stroke with right-sided weakness 11/2022, HTN, HLD, DM, CAD, CABG, dCHF, hypothyroidism, CKD-3A, who presents with generalized weakness and mild left facial droop.    Patient states he started feeling generalized weak since yesterday morning, denies unilateral numbness or tingling in extremities.  He was noted to have mild left facial droop, being slower in speaking per caregiver at the bedside.  No vision loss.  Patient has chronic hard of hearing, which has not changed.  Denies chest pain, cough, shortness of breath.  No nausea, vomiting, diarrhea or abdominal pain.  No symptoms of UTI.  No fever or chills.   Pt is  found to have new onset atrial fibrillation with HR 40-50s in ED.   Data reviewed independently and ED Course: pt was found to have WBC 6.6, worsening renal function, negative urinalysis, temperature normal, blood pressure 181/66, 135/70, RR 21, oxygen sat 97% on room air.  CT of head negative. MRI showed acute stroke.  Patient is admitted to telemetry bed as inpatient.  Dr. Gala Romney of cardiology and Dr. Delice Lesch of neurology are consulted.   MRI-brain: Acute infarct in the right posterior limb of the internal capsule and corona radiata.  "   Assessment and Plan: Assessment/Plan Principal Problem:   Stroke [facility]) Active Problems:   New onset atrial fibrillation (HCC)   Essential hypertension   Coronary artery disease   Type II diabetes mellitus with renal manifestations (HCC)   Chronic kidney disease, stage 3a (HCC)   Mixed hyperlipidemia    Hypothyroidism   COPD (chronic obstructive pulmonary disease) (HCC)   Chronic diastolic CHF (congestive heart failure) (HCC)   Obesity (BMI 30-39.9)     Assessment and Plan:     Acute ischemic stroke [facility]): Patient has history of stroke 1/24, he had mild right-sided weakness, which which seems to have resolved.  Now presents with mild left facial droop and generalized weakness.  MRI of brain showed acute infarct in the right posterior limb of the internal capsule and corona radiata.  Pt is found to have new onset A fib with slow heart rate.   Plan  neurologist on board we appreciate input- Continue management under hospitalist service - CTA of  head and neck negative for large vessel occlusion -Antihypertensives have been resumed after permissive hypertension -Continue aspirin and Plavix -Neurologist recommends discontinuation of statin therapy as patient's LDL is within normal limits -A1c 6.9 -Follow-up on echocardiogram, done results pending -Patient has been evaluated by speech therapist - PT/OT consult   New onset atrial fibrillation [facility]): CHA2DS2-VASc Score is 7,  Heart rate is slow in 40-50s. -Cardiology on board -Patient's family have agreed for Eliquis -TSH 1.96   Hypokalemia-continue repletion and monitoring  Hyperlipidemia, mixed -Continue fenofibrate   Essential hypertension --Antihypertensives have been resumed after permissive hypertension -We will continue hydralazine -Holding other nephrotoxic agents  - prn IV hydralazine for SBP>220 or dBP>110   Coronary artery disease: s/p of CABG. No chest pain -Continue ASA Not on beta-blockers on account of bradycardia   Type II diabetes mellitus  with renal manifestations [facility]):: Recent A1c 7.4, poorly controlled.  Patient is taking NovoLog and Lantus 40 units daily -SSI -Glargine insulin 30 units daily   Chronic kidney disease, stage 3a (HCC): Renal function is improved  recent baseline creatinine 1.08 on  12/14/2022.   -Cozaar is on hold due to initial worsening of renal function -Holding torsemide   Hypothyroidism -Continue levothyroxine   COPD (chronic obstructive pulmonary disease) (HCC): Stable -As needed bronchodilators   Chronic diastolic CHF (congestive heart failure) (HCC): 2D echo 12/11/2022 showed EF of 55 to 60% with grade 1 diastolic dysfunction.  Patient has trace leg edema, but no JVD, no shortness of breath.  CHF currently compensated -Holding torsemide due to bump up in function BNP 97.9 Follow-up on repeat echocardiogram   Obesity (BMI 30-39.9): Body weight 94.3 kg, BMI 32.58 -Healthy diet and exercise -Encourage losing weight   DVT ppx: Eliquis   Code Status: Full code per pt      Disposition Plan: Rehab facility   Consults:  cardiology and Dr. Wilford Corner of neurology are consulted.    Physical Exam   General: Not in acute distress HEENT:Eyes: PERRL, EOMI, no scleral icterus. Cardiac: S1/S2, RRR, No murmurs, No gallops or rubs. Respiratory: No rales, wheezing, rhonchi or rubs. GI: Soft, nondistended, nontender, no rebound pain GU: No hematuria Ext: No pitting leg edema bilaterally. 1+DP/PT pulse bilaterally. Musculoskeletal: No joint deformities Skin: No rashes.  Neuro: Alert, oriented X3, cranial nerves II-XII grossly intact. On examination, patient muscle strength is normal in all extremities, 5/5. Psych: Patient is not psychotic, no suicidal or hemocidal ideation.   Data Reviewed: I have reviewed patient's laboratory results showing sodium 140 potassium 3.4 creatinine 1.17   Family Communication: Discussed with patient's daughter as well as caretaker at bedside   Disposition: Status is: Inpatient   Time spent: 45 minutes   Vitals:   04/07/23 2000 04/07/23 2359 04/08/23 0401 04/08/23 0750  BP: (!) 176/59 (!) 149/63 (!) 153/62 (!) 160/58  Pulse: 60 (!) 53 (!) 58 (!) 55  Resp:  16 16 18   Temp: 98.3 F (36.8 C) 98.5 F (36.9 C) 98.6 F (37 C) 98.3  F (36.8 C)  TempSrc: Oral     SpO2: 96% 95% 96% 95%  Weight:      Height:         Author: Loyce Dys, MD 04/08/2023 11:27 AM  For on call review [email or web address].

## 2023-04-09 DIAGNOSIS — I25708 Atherosclerosis of coronary artery bypass graft(s), unspecified, with other forms of angina pectoris: Secondary | ICD-10-CM

## 2023-04-09 DIAGNOSIS — I5041 Acute combined systolic (congestive) and diastolic (congestive) heart failure: Secondary | ICD-10-CM | POA: Diagnosis not present

## 2023-04-09 DIAGNOSIS — I6300 Cerebral infarction due to thrombosis of unspecified precerebral artery: Secondary | ICD-10-CM | POA: Diagnosis not present

## 2023-04-09 DIAGNOSIS — I10 Essential (primary) hypertension: Secondary | ICD-10-CM | POA: Diagnosis not present

## 2023-04-09 DIAGNOSIS — I4891 Unspecified atrial fibrillation: Secondary | ICD-10-CM | POA: Diagnosis not present

## 2023-04-09 LAB — CBC WITH DIFFERENTIAL/PLATELET
Abs Immature Granulocytes: 0.02 10*3/uL (ref 0.00–0.07)
Basophils Absolute: 0.1 10*3/uL (ref 0.0–0.1)
Basophils Relative: 2 %
Eosinophils Absolute: 0.4 10*3/uL (ref 0.0–0.5)
Eosinophils Relative: 6 %
HCT: 37.9 % — ABNORMAL LOW (ref 39.0–52.0)
Hemoglobin: 12.5 g/dL — ABNORMAL LOW (ref 13.0–17.0)
Immature Granulocytes: 0 %
Lymphocytes Relative: 37 %
Lymphs Abs: 2.5 10*3/uL (ref 0.7–4.0)
MCH: 30.1 pg (ref 26.0–34.0)
MCHC: 33 g/dL (ref 30.0–36.0)
MCV: 91.3 fL (ref 80.0–100.0)
Monocytes Absolute: 0.5 10*3/uL (ref 0.1–1.0)
Monocytes Relative: 8 %
Neutro Abs: 3.3 10*3/uL (ref 1.7–7.7)
Neutrophils Relative %: 47 %
Platelets: 227 10*3/uL (ref 150–400)
RBC: 4.15 MIL/uL — ABNORMAL LOW (ref 4.22–5.81)
RDW: 14 % (ref 11.5–15.5)
WBC: 6.9 10*3/uL (ref 4.0–10.5)
nRBC: 0 % (ref 0.0–0.2)

## 2023-04-09 LAB — BASIC METABOLIC PANEL
Anion gap: 7 (ref 5–15)
BUN: 16 mg/dL (ref 8–23)
CO2: 21 mmol/L — ABNORMAL LOW (ref 22–32)
Calcium: 8.6 mg/dL — ABNORMAL LOW (ref 8.9–10.3)
Chloride: 109 mmol/L (ref 98–111)
Creatinine, Ser: 1.17 mg/dL (ref 0.61–1.24)
GFR, Estimated: 57 mL/min — ABNORMAL LOW (ref >60)
Glucose, Bld: 157 mg/dL — ABNORMAL HIGH (ref 70–99)
Potassium: 3.6 mmol/L (ref 3.5–5.1)
Sodium: 137 mmol/L (ref 135–145)

## 2023-04-09 LAB — GLUCOSE, CAPILLARY
Glucose-Capillary: 160 mg/dL — ABNORMAL HIGH (ref 70–99)
Glucose-Capillary: 267 mg/dL — ABNORMAL HIGH (ref 70–99)
Glucose-Capillary: 243 mg/dL — ABNORMAL HIGH (ref 70–99)
Glucose-Capillary: 245 mg/dL — ABNORMAL HIGH (ref 70–99)

## 2023-04-09 MED ORDER — SALINE SPRAY 0.65 % NA SOLN
1.0000 | NASAL | Status: DC | PRN
  Administered 2023-04-09: 1 via NASAL
  Filled 2023-04-09: qty 44

## 2023-04-09 MED ORDER — GUAIFENESIN 100 MG/5ML PO LIQD: 5.0000 mL | ORAL | Status: DC | PRN

## 2023-04-09 MED ORDER — LOSARTAN POTASSIUM 50 MG PO TABS
50.0000 mg | ORAL_TABLET | Freq: Every day | ORAL | Status: DC
  Administered 2023-04-09: 50 mg via ORAL
  Filled 2023-04-09: qty 1

## 2023-04-09 MED ORDER — POTASSIUM CHLORIDE CRYS ER 20 MEQ PO TBCR
40.0000 meq | EXTENDED_RELEASE_TABLET | Freq: Once | ORAL | Status: AC
  Administered 2023-04-09: 40 meq via ORAL
  Filled 2023-04-09: qty 2

## 2023-04-09 MED ORDER — IPRATROPIUM-ALBUTEROL 0.5-2.5 (3) MG/3ML IN SOLN: 3.0000 mL | RESPIRATORY_TRACT | Status: DC | PRN

## 2023-04-09 MED ORDER — TRAZODONE HCL 50 MG PO TABS: 50.0000 mg | ORAL_TABLET | Freq: Every evening | ORAL | Status: DC | PRN

## 2023-04-09 NOTE — Plan of Care (Signed)

## 2023-04-09 NOTE — Consult Note (Signed)
PHARMACY CONSULT NOTE - ELECTROLYTES  Pharmacy Consult for Electrolyte Monitoring and Replacement   Recent Labs: Potassium (mmol/L)  Date Value  04/09/2023 3.6   Magnesium (mg/dL)  Date Value  04/54/0981 1.7   Calcium (mg/dL)  Date Value  19/14/7829 8.6 (L)   Albumin (g/dL)  Date Value  56/21/3086 3.3 (L)  06/01/2022 3.9   Phosphorus (mg/dL)  Date Value  57/84/6962 3.4   Sodium (mmol/L)  Date Value  04/09/2023 137  06/01/2022 139   Corrected Ca: 9.2 mg/dL  Assessment  Joseph [name] is a 89 y.o. male presenting with stroke. PMH significant for HTN, HLD, DM, CAD, CABG, dCHF, hypothyroidism, CKD3a. Pharmacy has been consulted to monitor and replace electrolytes.  Diet: regular  Goal of Therapy: Electrolytes WNL  Plan:  No replacement warranted today Check BMP, Mg with AM labs  Thank you for allowing pharmacy to be a part of this patient's care.  Celene Squibb, PharmD PGY1 Pharmacy Resident 04/09/2023 8:46 AM

## 2023-04-09 NOTE — Progress Notes (Signed)
Mobility Specialist - Progress Note   04/09/23 1028  Mobility  Activity Ambulated with assistance in room;Ambulated with assistance to bathroom;Dangled on edge of bed  Level of Assistance Standby assist, set-up cues, supervision of patient - no hands on  Assistive Device Front wheel walker  Distance Ambulated (ft) 15 ft  Activity Response Tolerated well  Mobility Referral Yes  $Mobility charge 1 Mobility  Mobility Specialist Start Time (ACUTE ONLY) [id number]  Mobility Specialist Stop Time (ACUTE ONLY) 1000  Mobility Specialist Time Calculation (min) (ACUTE ONLY) 17 min   Pt sitting in recliner on RA upon arrival. Pt STS from recliner and ambulated to bathroom SBA. Pt STS from toilet MinA and ambulates back to recliner SBA. Pt left in recliner with needs in reach.   Terrilyn Saver  Mobility Specialist  04/09/23 10:35 AM

## 2023-04-09 NOTE — Progress Notes (Signed)
Patient refused his 0000 NIH stroke scale and vitals, when they were due. RN was able to complete both the vitals and NIH at 0115. The patient told this RN he does not want to be woken up for the 0400 vitals / NIH stroke scale. He states he would prefer to sleep.   RN educated the patient on the reason for the NIH stroke scale and the vitals. He continues to states he does not wish to be woken for them.  RN notified Manuela Schwartz, NP of this information.

## 2023-04-09 NOTE — Progress Notes (Signed)
Rounding Note    Patient Name: Joseph [name] Date of Encounter: 04/09/2023  [facility] Cardiologist: Julien Nordmann, MD   Subjective   Feeling tired. Did not sleep well.  Feeling too weak to roll over in bed.   Inpatient Medications    Scheduled Meds:  apixaban  5 mg Oral BID   fenofibrate  54 mg Oral Daily   hydrALAZINE  50 mg Oral Q8H   insulin aspart  0-5 Units Subcutaneous QHS   insulin aspart  0-9 Units Subcutaneous TID WC   insulin glargine-yfgn  30 Units Subcutaneous Daily   levothyroxine  50 mcg Oral QAC breakfast   magnesium oxide  200 mg Oral Daily   melatonin  5 mg Oral QHS   pantoprazole  40 mg Oral Daily   Continuous Infusions:  PRN Meds: acetaminophen **OR** acetaminophen (TYLENOL) oral liquid 160 mg/5 mL **OR** acetaminophen, guaiFENesin, hydrALAZINE, ipratropium-albuterol, nitroGLYCERIN, ondansetron (ZOFRAN) IV, senna-docusate, traZODone   Vital Signs    Vitals:   04/08/23 1940 04/08/23 2207 04/09/23 0114 04/09/23 0805  BP: (!) 178/56 (!) 157/60 (!) 143/55 (!) 144/60  Pulse: 72  94 91  Resp: 18  20 18   Temp: 97.7 F (36.5 C)  98 F (36.7 C) 98 F (36.7 C)  TempSrc: Oral     SpO2: 97%  95% 99%  Weight:      Height:        Intake/Output Summary (Last 24 hours) at 04/09/2023 0925 Last data filed at 04/08/2023 1500 Gross per 24 hour  Intake --  Output 700 ml  Net -700 ml      04/06/2023   12:02 PM 01/13/2023   10:16 AM 01/12/2023   11:24 AM  Last 3 Weights  Weight (lbs) 208 lb 208 lb 8 oz 208 lb 12.8 oz  Weight (kg) 94.348 kg 94.575 kg 94.711 kg      Telemetry    Atrial fibrillation.  Rate mostly <100 bpm.  Personally Reviewed  ECG    N/a - Personally Reviewed  Physical Exam   VS:  BP (!) 144/60 (BP Location: Right Arm)   Pulse 91   Temp 98 F (36.7 C)   Resp 18   Ht 5\' 7"  (1.702 m)   Wt 94.3 kg   SpO2 99%   BMI 32.58 kg/m  , BMI Body mass index is 32.58 kg/m. GENERAL:  Well appearing.  No acute  distress HEENT: Pupils equal round and reactive, fundi not visualized, oral mucosa unremarkable NECK:  No jugular venous distention, waveform within normal limits, carotid upstroke brisk and symmetric, no bruits, no thyromegaly LUNGS:  Clear to auscultation bilaterally HEART: Irregularly irregular PMI not displaced or sustained,S1 and S2 within normal limits, no S3, no S4, no clicks, no rubs, 2/6 systolic murmur at the left upper sternal border ABD:  Flat, positive bowel sounds normal in frequency in pitch, no bruits, no rebound, no guarding, no midline pulsatile mass, no hepatomegaly, no splenomegaly EXT:  2 plus pulses throughout, no edema, no cyanosis no clubbing SKIN:  No rashes no nodules NEURO:  Cranial nerves II through XII grossly intact, motor grossly intact throughout PSYCH:  Cognitively intact, oriented to person place and time    Labs    High Sensitivity Troponin:   Recent Labs  Lab 04/06/23 1205 04/06/23 1206  TROPONINIHS 14 14     Chemistry Recent Labs  Lab 04/06/23 1206 04/07/23 0953 04/08/23 0408 04/09/23 0423  NA 137 139 140 137  K 3.6  3.6 3.4* 3.6  CL 105 110 111 109  CO2 24 23 23  21*  GLUCOSE 240* 168* 126* 157*  BUN 19 16 16 16   CREATININE 1.32* 1.09 1.17 1.17  CALCIUM 8.7* 8.7* 8.6* 8.6*  PROT 6.3*  --   --   --   ALBUMIN 3.3*  --   --   --   AST 30  --   --   --   ALT 15  --   --   --   ALKPHOS 39  --   --   --   BILITOT 1.0  --   --   --   GFRNONAA 50* >60 57* 57*  ANIONGAP 8 6 6 7     Lipids  Recent Labs  Lab 04/07/23 0410  CHOL 126  TRIG 138  HDL 28*  LDLCALC 70  CHOLHDL 4.5    Hematology Recent Labs  Lab 04/07/23 0953 04/08/23 0408 04/09/23 0423  WBC 4.8 5.5 6.9  RBC 4.30 3.96* 4.15*  HGB 12.8* 11.9* 12.5*  HCT 38.4* 35.5* 37.9*  MCV 89.3 89.6 91.3  MCH 29.8 30.1 30.1  MCHC 33.3 33.5 33.0  RDW 13.9 13.7 14.0  PLT 237 232 227   Thyroid  Recent Labs  Lab 04/06/23 1206  TSH 1.962    BNP Recent Labs  Lab  04/06/23 1206  BNP 97.9    DDimer No results for input(s): "DDIMER" in the last 168 hours.   Radiology    ECHOCARDIOGRAM COMPLETE  Result Date: 04/07/2023    ECHOCARDIOGRAM REPORT   Patient Name:   PRYCE FLY Date of Exam: 04/07/2023 Medical Rec #:  [id number]         Height:       67.0 in Accession #:    [id number]        Weight:       208.0 lb Date of Birth:  [birth year]        BSA:          2.056 m Patient Age:    89 years          BP:           151/55 mmHg Patient Gender: M                 HR:           54 bpm. Exam Location:  [facility] Procedure: 2D Echo, Cardiac Doppler, Color Doppler and Intracardiac            Opacification Agent Indications:     Sroke  History:         Patient has prior history of Echocardiogram examinations, most                  recent 12/11/2022. CHF, CAD and Previous Myocardial Infarction,                  Prior CABG, Stroke and COPD, Arrythmias:Atrial Fibrillation and                  Bradycardia, Signs/Symptoms:Edema, Dyspnea, Fatigue and                  Shortness of Breath; Risk Factors:Hypertension, Diabetes and                  Dyslipidemia.  Sonographer:     Mikki Harbor Referring Phys:  [id number] Lorretta Harp Diagnosing Phys: Julien Nordmann MD  Sonographer Comments: Image acquisition challenging due to COPD.  IMPRESSIONS  1. Left ventricular ejection fraction, by estimation, is 45 to 50%. The left ventricle has mildly decreased function. The left ventricle has no regional wall motion abnormalities. Left ventricular diastolic parameters are indeterminate.  2. Right ventricular systolic function is normal. The right ventricular size is normal. There is normal pulmonary artery systolic pressure.  3. Left atrial size was moderately dilated.  4. The mitral valve is normal in structure. Mild to moderate mitral valve regurgitation. No evidence of mitral stenosis.  5. The aortic valve is normal in structure. Aortic valve regurgitation is not visualized. Aortic valve  sclerosis/calcification is present, without any evidence of aortic stenosis. Aortic valve mean gradient measures 8.0 mmHg.  6. There is borderline dilatation of the ascending aorta, measuring 38 mm.  7. The inferior vena cava is normal in size with greater than 50% respiratory variability, suggesting right atrial pressure of 3 mmHg. FINDINGS  Left Ventricle: Left ventricular ejection fraction, by estimation, is 45 to 50%. The left ventricle has mildly decreased function. The left ventricle has no regional wall motion abnormalities. Definity contrast agent was given IV to delineate the left ventricular endocardial borders. The left ventricular internal cavity size was normal in size. There is no left ventricular hypertrophy. Left ventricular diastolic parameters are indeterminate. Right Ventricle: The right ventricular size is normal. No increase in right ventricular wall thickness. Right ventricular systolic function is normal. There is normal pulmonary artery systolic pressure. The tricuspid regurgitant velocity is 1.84 m/s, and  with an assumed right atrial pressure of 3 mmHg, the estimated right ventricular systolic pressure is 16.5 mmHg. Left Atrium: Left atrial size was moderately dilated. Right Atrium: Right atrial size was normal in size. Pericardium: There is no evidence of pericardial effusion. Mitral Valve: The mitral valve is normal in structure. Mild to moderate mitral valve regurgitation. No evidence of mitral valve stenosis. MV peak gradient, 3.7 mmHg. The mean mitral valve gradient is 1.0 mmHg. Tricuspid Valve: The tricuspid valve is normal in structure. Tricuspid valve regurgitation is not demonstrated. No evidence of tricuspid stenosis. Aortic Valve: The aortic valve is normal in structure. Aortic valve regurgitation is not visualized. Aortic valve sclerosis/calcification is present, without any evidence of aortic stenosis. Aortic valve mean gradient measures 8.0 mmHg. Aortic valve peak  gradient  measures 16.3 mmHg. Aortic valve area, by VTI measures 1.61 cm. Pulmonic Valve: The pulmonic valve was normal in structure. Pulmonic valve regurgitation is not visualized. No evidence of pulmonic stenosis. Aorta: The aortic root is normal in size and structure. There is borderline dilatation of the ascending aorta, measuring 38 mm. Venous: The inferior vena cava is normal in size with greater than 50% respiratory variability, suggesting right atrial pressure of 3 mmHg. IAS/Shunts: No atrial level shunt detected by color flow Doppler.  LEFT VENTRICLE PLAX 2D LVIDd:         5.30 cm   Diastology LVIDs:         3.40 cm   LV e' medial:    6.20 cm/s LV PW:         1.40 cm   LV E/e' medial:  12.1 LV IVS:        1.30 cm   LV e' lateral:   9.46 cm/s LVOT diam:     2.10 cm   LV E/e' lateral: 7.9 LV SV:         79 LV SV Index:   39 LVOT Area:     3.46 cm  RIGHT VENTRICLE RV Basal diam:  3.60 cm RV Mid diam:    3.30 cm RV S prime:     7.83 cm/s TAPSE (M-mode): 1.8 cm LEFT ATRIUM             Index        RIGHT ATRIUM           Index LA diam:        5.40 cm 2.63 cm/m   RA Area:     17.00 cm LA Vol (A2C):   72.3 ml 35.16 ml/m  RA Volume:   40.10 ml  19.50 ml/m LA Vol (A4C):   52.6 ml 25.58 ml/m LA Biplane Vol: 62.8 ml 30.54 ml/m  AORTIC VALVE                     PULMONIC VALVE AV Area (Vmax):    1.78 cm      PV Vmax:       1.18 m/s AV Area (Vmean):   1.62 cm      PV Peak grad:  5.6 mmHg AV Area (VTI):     1.61 cm AV Vmax:           202.00 cm/s AV Vmean:          133.000 cm/s AV VTI:            0.493 m AV Peak Grad:      16.3 mmHg AV Mean Grad:      8.0 mmHg LVOT Vmax:         104.00 cm/s LVOT Vmean:        62.100 cm/s LVOT VTI:          0.229 m LVOT/AV VTI ratio: 0.46  AORTA Ao Root diam: 4.30 cm Ao Asc diam:  3.80 cm MITRAL VALVE               TRICUSPID VALVE MV Area (PHT): 2.83 cm    TR Peak grad:   13.5 mmHg MV Area VTI:   2.48 cm    TR Vmax:        184.00 cm/s MV Peak grad:  3.7 mmHg MV Mean grad:  1.0 mmHg     SHUNTS MV Vmax:       0.96 m/s    Systemic VTI:  0.23 m MV Vmean:      50.6 cm/s   Systemic Diam: 2.10 cm MV Decel Time: 268 msec MV E velocity: 74.90 cm/s MV A velocity: 95.90 cm/s MV E/A ratio:  0.78 Julien Nordmann MD Electronically signed by Julien Nordmann MD Signature Date/Time: 04/07/2023/6:01:01 PM    Final    CT HEAD WO CONTRAST ( )  Result Date: 04/07/2023 CLINICAL DATA:  Stroke, follow up EXAM: CT HEAD WITHOUT CONTRAST TECHNIQUE: Contiguous axial images were obtained from the base of the skull through the vertex without intravenous contrast. RADIATION DOSE REDUCTION: This exam was performed according to the departmental dose-optimization program which includes automated exposure control, adjustment of the mA and/or kV according to patient size and/or use of iterative reconstruction technique. COMPARISON:  MR Brain 04/06/23, CT head 04/06/23 FINDINGS: Brain: No hemorrhage. No hydrocephalus. There are prominent subarachnoid spaces bilaterally, unchanged. There is sequela of moderate chronic microvascular ischemic change with a chronic infarct in the left corona radiata and an acute infarct in the right basal ganglia, better characterized on recent brain MRI. Vascular: No hyperdense vessel or unexpected calcification. Skull: Normal. Negative for fracture or focal lesion. Sinuses/Orbits: No mastoid or middle ear effusion. Paranasal sinuses  are clear. Bilateral lens replacement. Orbits are otherwise unremarkable. Other: None. IMPRESSION: 1. No acute intracranial hemorrhage. 2. Acute infarct in the right basal ganglia, better characterized on recent brain MRI. Electronically Signed   By: Lorenza Cambridge M.D.   On: 04/07/2023 16:08    Cardiac Studies   Echo 04/08/23: IMPRESSIONS    1. Left ventricular ejection fraction, by estimation, is 45 to 50%. The  left ventricle has mildly decreased function. The left ventricle has no  regional wall motion abnormalities. Left ventricular diastolic parameters  are  indeterminate.   2. Right ventricular systolic function is normal. The right ventricular  size is normal. There is normal pulmonary artery systolic pressure.   3. Left atrial size was moderately dilated.   4. The mitral valve is normal in structure. Mild to moderate mitral valve  regurgitation. No evidence of mitral stenosis.   5. The aortic valve is normal in structure. Aortic valve regurgitation is  not visualized. Aortic valve sclerosis/calcification is present, without  any evidence of aortic stenosis. Aortic valve mean gradient measures 8.0  mmHg.   6. There is borderline dilatation of the ascending aorta, measuring 38  mm.   7. The inferior vena cava is normal in size with greater than 50%  respiratory variability, suggesting right atrial pressure of 3 mmHg.    Patient Profile     89 y.o. male with CAD s/p CABG, CVA, HTN, carotid stenosis, HL, DM and hypothyroidism admitted with symptoms concerning for stroke.    Assessment & Plan   # Persistent atrial fibrillation: Patient with recent stroke X2.  Concern for anticoagulation due to frequent nose bleeds.  He did have one mild nose bleed overnight.  Stopped with applying tissue.  Continue to use nasal saline ointment.  Planning for outpatient Watchman evaluation.  Patient has been agreeable to starting Eliquis short tern.  Holding home clopidogrel.  He is not on any nodal agents due to bradycardia at baseline.   # CVA:  Patient had a stroke 11/2022 and recurrent symptoms 03/2023.  Needs PT.  Too weak to turn in bed.   # HTN: # Hypertensive emergency: BP remains elevated.  Presume that neurology wants to allow permissive HTN for now.  As BP can be more aggressively managed, recommend resuming home losartan.  HR too low for beta blocker.  He has an [place] allergy.  # HFmrEF:  Echo this admission LVEF 45-50%.  Down from 55-60% 11/2022.  On my review there did seem to be some mild hypokinesis of the septal myocardium on the prior echo  as well.  Nevertheless, it is worse now than during his prior hospitalization.  He denies any ischemic symptoms.  Would defer an ischemic evaluation at this time given his age and lack of symptoms.  Would recommend considering outpatient DCCV if he is able to tolerate anticoagulation for 3 weeks.  Repeat echo after maintaining sinus rhythm.  Once cleared by neurology for tighter blood pressure control, would recommend adding an ARB and spironolactone as renal function permit.    # CAD s/p CABG:  #Hyperlipidemia:  Continue atorvastatin and fenofibrate.  Holding clopidogrel as above due to concerns for epistaxis and the need to add Eliquis.  # Carotid stenosis: Mild.  Continue medical management.     For questions or updates, please contact [facility] Please consult [email or web address] for contact info under        Signed, Chilton Si, MD  04/09/2023, 9:25 AM

## 2023-04-09 NOTE — Progress Notes (Addendum)
PROGRESS NOTE    Joseph [name]  Joseph [name] DOB: [birth year] DOA: 04/06/2023 PCP: Bosie Clos, MD   Brief Narrative:  Joseph [name] is a 89 y.o. male with medical history significant of stroke with right-sided weakness 11/2022, HTN, HLD, DM, CAD, CABG, dCHF, hypothyroidism, CKD-3A, who presents with generalized weakness and mild left facial droop.  Patient diagnosed with acute CVA on MRI.  Echocardiogram also showed EF of 45% and new onset atrial fibrillation therefore cardiology team consulted.  Neurology recommending aspirin Plavix for 3 months followed by aspirin alone.   Assessment & Plan:  Principal Problem:   Stroke [facility]) Active Problems:   New onset atrial fibrillation (HCC)   Essential hypertension   Coronary artery disease   Type II diabetes mellitus with renal manifestations (HCC)   Chronic kidney disease, stage 3a (HCC)   Mixed hyperlipidemia   Hypothyroidism   COPD (chronic obstructive pulmonary disease) (HCC)   Chronic diastolic CHF (congestive heart failure) (HCC)   Obesity (BMI 30-39.9)   Weakness   Acute combined systolic and diastolic heart failure (HCC)      Acute ischemic stroke [facility]):  Patient found to have acute CVA on MRI.  Also had new onset atrial fibrillation.  CTA did not show any large vessel occlusion.  A1c 6.9.  Neurology recommending aspirin and Plavix for total of 3 months followed by aspirin alone but stopped as patient is now on Eliquis for A-fib.  PT/OT has recommended SNF.    New onset atrial fibrillation [facility]): CHA2DS2-VASc Score is 7,  Currently rate is under control.  Eliquis twice daily   Hypokalemia-continue repletion and monitoring   Hyperlipidemia, mixed -Continue fenofibrate   Essential hypertension Slowly resume antihypertensive meds.  We can start normalizing blood pressure.  Will defer adding cardiac medicines   Coronary artery disease: s/p of CABG. No chest pain -Continue ASA Not on beta-blockers on account  of bradycardia   Type II diabetes mellitus with renal manifestations [facility]):: Recent A1c 7.4, poorly controlled.  Patient is taking NovoLog and Lantus 40 units daily -SSI -Glargine insulin 30 units daily.  If we add Farxiga/Jardiance his insulin regimen will need to be adjusted.   Chronic kidney disease, stage 3a (HCC): Renal function is improved  I will go ahead and start losartan.   Hypothyroidism -Continue levothyroxine   COPD (chronic obstructive pulmonary disease) (HCC): Stable -As needed bronchodilators   Chronic diastolic CHF (congestive heart failure) (HCC):  Echocardiogram shows EF of 45%.  Medication adjustment per cardiology team. I will go ahead and start losartan.  Eventually should be on Aldactone as well.   Obesity (BMI 30-39.9): Body weight 94.3 kg, BMI 32.58 Supportive care  PT/OT-SNF   DVT ppx: Eliquis   Code Status: Full code per pt      Disposition Plan: Awaiting SNF placement Friend present at bedside   Consults:  cardiology and Dr. Wilford Corner of neurology are consulted.        Diet Orders (From admission, onward)     Start     Ordered   04/07/23 1544  Diet heart healthy/carb modified Fluid consistency: Thin  Diet effective now       Comments: NO STRAWS!  Question:  Fluid consistency:  Answer:  Thin   04/07/23 1543            Subjective:  Seen at bedside, sitting up on recliner.  No complaints  Examination:  General exam: Appears calm and comfortable  Respiratory system: Clear to auscultation. Respiratory effort  normal. Cardiovascular system: Mild systolic murmur.  Irregularly irregular Gastrointestinal system: Abdomen is nondistended, soft and nontender. No organomegaly or masses felt. Normal bowel sounds heard. Central nervous system: Alert and oriented. No focal neurological deficits. Extremities: Symmetric 5 x 5 power. Skin: No rashes, lesions or ulcers Psychiatry: Judgement and insight appear normal. Mood & affect  appropriate.  Objective: Vitals:   04/08/23 1940 04/08/23 2207 04/09/23 0114 04/09/23 0805  BP: (!) 178/56 (!) 157/60 (!) 143/55 (!) 144/60  Pulse: 72  94 91  Resp: 18  20 18   Temp: 97.7 F (36.5 C)  98 F (36.7 C) 98 F (36.7 C)  TempSrc: Oral     SpO2: 97%  95% 99%  Weight:      Height:        Intake/Output Summary (Last 24 hours) at 04/09/2023 1126 Last data filed at 04/09/2023 0900 Gross per 24 hour  Intake 240 ml  Output 700 ml  Net -460 ml   Filed Weights   04/06/23 1202  Weight: 94.3 kg    Scheduled Meds:  apixaban  5 mg Oral BID   fenofibrate  54 mg Oral Daily   hydrALAZINE  50 mg Oral Q8H   insulin aspart  0-5 Units Subcutaneous QHS   insulin aspart  0-9 Units Subcutaneous TID WC   insulin glargine-yfgn  30 Units Subcutaneous Daily   levothyroxine  50 mcg Oral QAC breakfast   magnesium oxide  200 mg Oral Daily   melatonin  5 mg Oral QHS   pantoprazole  40 mg Oral Daily   Continuous Infusions:  Nutritional status     Body mass index is 32.58 kg/m.  Data Reviewed:   CBC: Recent Labs  Lab 04/06/23 1206 04/07/23 0953 04/08/23 0408 04/09/23 0423  WBC 6.6 4.8 5.5 6.9  NEUTROABS  --  2.0 2.1 3.3  HGB 12.9* 12.8* 11.9* 12.5*  HCT 38.7* 38.4* 35.5* 37.9*  MCV 90.8 89.3 89.6 91.3  PLT 256 237 232 227   Basic Metabolic Panel: Recent Labs  Lab 04/06/23 1206 04/07/23 0953 04/08/23 0408 04/09/23 0423  NA 137 139 140 137  K 3.6 3.6 3.4* 3.6  CL 105 110 111 109  CO2 24 23 23  21*  GLUCOSE 240* 168* 126* 157*  BUN 19 16 16 16   CREATININE 1.32* 1.09 1.17 1.17  CALCIUM 8.7* 8.7* 8.6* 8.6*   GFR: Estimated Creatinine Clearance: 41.3 mL/min (by C-G formula based on SCr of 1.17 mg/dL). Liver Function Tests: Recent Labs  Lab 04/06/23 1206  AST 30  ALT 15  ALKPHOS 39  BILITOT 1.0  PROT 6.3*  ALBUMIN 3.3*   No results for input(s): "LIPASE", "AMYLASE" in the last 168 hours. No results for input(s): "AMMONIA" in the last 168  hours. Coagulation Profile: Recent Labs  Lab 04/06/23 2128  INR 1.1   Cardiac Enzymes: No results for input(s): "CKTOTAL", "CKMB", "CKMBINDEX", "TROPONINI" in the last 168 hours. BNP (last 3 results) No results for input(s): "PROBNP" in the last 8760 hours. HbA1C: Recent Labs    04/07/23 0410  HGBA1C 6.9*   CBG: Recent Labs  Lab 04/07/23 1956 04/08/23 0754 04/08/23 1158 04/08/23 1643 04/09/23 0802  GLUCAP 149* 142* 218* 238* 160*   Lipid Profile: Recent Labs    04/07/23 0410  CHOL 126  HDL 28*  LDLCALC 70  TRIG 161  CHOLHDL 4.5   Thyroid Function Tests: Recent Labs    04/06/23 1206  TSH 1.962   Anemia Panel: No results for input(s): "  VITAMINB12", "FOLATE", "FERRITIN", "TIBC", "IRON", "RETICCTPCT" in the last 72 hours. Sepsis Labs: No results for input(s): "PROCALCITON", "LATICACIDVEN" in the last 168 hours.  No results found for this or any previous visit (from the past 240 hour(s)).       Radiology Studies: ECHOCARDIOGRAM COMPLETE  Result Date: 04/07/2023    ECHOCARDIOGRAM REPORT   Patient Name:   OSVIN TYNER Date of Exam: 04/07/2023 Medical Rec #:  [id number]         Height:       67.0 in Accession #:    [id number]        Weight:       208.0 lb Date of Birth:  11/19/1925        BSA:          2.056 m Patient Age:    89 years          BP:           151/55 mmHg Patient Gender: M                 HR:           54 bpm. Exam Location:  [facility] Procedure: 2D Echo, Cardiac Doppler, Color Doppler and Intracardiac            Opacification Agent Indications:     Sroke  History:         Patient has prior history of Echocardiogram examinations, most                  recent 12/11/2022. CHF, CAD and Previous Myocardial Infarction,                  Prior CABG, Stroke and COPD, Arrythmias:Atrial Fibrillation and                  Bradycardia, Signs/Symptoms:Edema, Dyspnea, Fatigue and                  Shortness of Breath; Risk Factors:Hypertension, Diabetes and                   Dyslipidemia.  Sonographer:     Mikki Harbor Referring Phys:  [id number] Lorretta Harp Diagnosing Phys: Julien Nordmann MD  Sonographer Comments: Image acquisition challenging due to COPD. IMPRESSIONS  1. Left ventricular ejection fraction, by estimation, is 45 to 50%. The left ventricle has mildly decreased function. The left ventricle has no regional wall motion abnormalities. Left ventricular diastolic parameters are indeterminate.  2. Right ventricular systolic function is normal. The right ventricular size is normal. There is normal pulmonary artery systolic pressure.  3. Left atrial size was moderately dilated.  4. The mitral valve is normal in structure. Mild to moderate mitral valve regurgitation. No evidence of mitral stenosis.  5. The aortic valve is normal in structure. Aortic valve regurgitation is not visualized. Aortic valve sclerosis/calcification is present, without any evidence of aortic stenosis. Aortic valve mean gradient measures 8.0 mmHg.  6. There is borderline dilatation of the ascending aorta, measuring 38 mm.  7. The inferior vena cava is normal in size with greater than 50% respiratory variability, suggesting right atrial pressure of 3 mmHg. FINDINGS  Left Ventricle: Left ventricular ejection fraction, by estimation, is 45 to 50%. The left ventricle has mildly decreased function. The left ventricle has no regional wall motion abnormalities. Definity contrast agent was given IV to delineate the left ventricular endocardial borders. The left ventricular internal cavity size was normal in size. There  is no left ventricular hypertrophy. Left ventricular diastolic parameters are indeterminate. Right Ventricle: The right ventricular size is normal. No increase in right ventricular wall thickness. Right ventricular systolic function is normal. There is normal pulmonary artery systolic pressure. The tricuspid regurgitant velocity is 1.84 m/s, and  with an assumed right atrial pressure of 3 mmHg, the  estimated right ventricular systolic pressure is 16.5 mmHg. Left Atrium: Left atrial size was moderately dilated. Right Atrium: Right atrial size was normal in size. Pericardium: There is no evidence of pericardial effusion. Mitral Valve: The mitral valve is normal in structure. Mild to moderate mitral valve regurgitation. No evidence of mitral valve stenosis. MV peak gradient, 3.7 mmHg. The mean mitral valve gradient is 1.0 mmHg. Tricuspid Valve: The tricuspid valve is normal in structure. Tricuspid valve regurgitation is not demonstrated. No evidence of tricuspid stenosis. Aortic Valve: The aortic valve is normal in structure. Aortic valve regurgitation is not visualized. Aortic valve sclerosis/calcification is present, without any evidence of aortic stenosis. Aortic valve mean gradient measures 8.0 mmHg. Aortic valve peak  gradient measures 16.3 mmHg. Aortic valve area, by VTI measures 1.61 cm. Pulmonic Valve: The pulmonic valve was normal in structure. Pulmonic valve regurgitation is not visualized. No evidence of pulmonic stenosis. Aorta: The aortic root is normal in size and structure. There is borderline dilatation of the ascending aorta, measuring 38 mm. Venous: The inferior vena cava is normal in size with greater than 50% respiratory variability, suggesting right atrial pressure of 3 mmHg. IAS/Shunts: No atrial level shunt detected by color flow Doppler.  LEFT VENTRICLE PLAX 2D LVIDd:         5.30 cm   Diastology LVIDs:         3.40 cm   LV e' medial:    6.20 cm/s LV PW:         1.40 cm   LV E/e' medial:  12.1 LV IVS:        1.30 cm   LV e' lateral:   9.46 cm/s LVOT diam:     2.10 cm   LV E/e' lateral: 7.9 LV SV:         79 LV SV Index:   39 LVOT Area:     3.46 cm  RIGHT VENTRICLE RV Basal diam:  3.60 cm RV Mid diam:    3.30 cm RV S prime:     7.83 cm/s TAPSE (M-mode): 1.8 cm LEFT ATRIUM             Index        RIGHT ATRIUM           Index LA diam:        5.40 cm 2.63 cm/m   RA Area:     17.00 cm LA  Vol (A2C):   72.3 ml 35.16 ml/m  RA Volume:   40.10 ml  19.50 ml/m LA Vol (A4C):   52.6 ml 25.58 ml/m LA Biplane Vol: 62.8 ml 30.54 ml/m  AORTIC VALVE                     PULMONIC VALVE AV Area (Vmax):    1.78 cm      PV Vmax:       1.18 m/s AV Area (Vmean):   1.62 cm      PV Peak grad:  5.6 mmHg AV Area (VTI):     1.61 cm AV Vmax:           202.00 cm/s AV  Vmean:          133.000 cm/s AV VTI:            0.493 m AV Peak Grad:      16.3 mmHg AV Mean Grad:      8.0 mmHg LVOT Vmax:         104.00 cm/s LVOT Vmean:        62.100 cm/s LVOT VTI:          0.229 m LVOT/AV VTI ratio: 0.46  AORTA Ao Root diam: 4.30 cm Ao Asc diam:  3.80 cm MITRAL VALVE               TRICUSPID VALVE MV Area (PHT): 2.83 cm    TR Peak grad:   13.5 mmHg MV Area VTI:   2.48 cm    TR Vmax:        184.00 cm/s MV Peak grad:  3.7 mmHg MV Mean grad:  1.0 mmHg    SHUNTS MV Vmax:       0.96 m/s    Systemic VTI:  0.23 m MV Vmean:      50.6 cm/s   Systemic Diam: 2.10 cm MV Decel Time: 268 msec MV E velocity: 74.90 cm/s MV A velocity: 95.90 cm/s MV E/A ratio:  0.78 Julien Nordmann MD Electronically signed by Julien Nordmann MD Signature Date/Time: 04/07/2023/6:01:01 PM    Final    CT HEAD WO CONTRAST ( )  Result Date: 04/07/2023 CLINICAL DATA:  Stroke, follow up EXAM: CT HEAD WITHOUT CONTRAST TECHNIQUE: Contiguous axial images were obtained from the base of the skull through the vertex without intravenous contrast. RADIATION DOSE REDUCTION: This exam was performed according to the departmental dose-optimization program which includes automated exposure control, adjustment of the mA and/or kV according to patient size and/or use of iterative reconstruction technique. COMPARISON:  MR Brain 04/06/23, CT head 04/06/23 FINDINGS: Brain: No hemorrhage. No hydrocephalus. There are prominent subarachnoid spaces bilaterally, unchanged. There is sequela of moderate chronic microvascular ischemic change with a chronic infarct in the left corona radiata and an  acute infarct in the right basal ganglia, better characterized on recent brain MRI. Vascular: No hyperdense vessel or unexpected calcification. Skull: Normal. Negative for fracture or focal lesion. Sinuses/Orbits: No mastoid or middle ear effusion. Paranasal sinuses are clear. Bilateral lens replacement. Orbits are otherwise unremarkable. Other: None. IMPRESSION: 1. No acute intracranial hemorrhage. 2. Acute infarct in the right basal ganglia, better characterized on recent brain MRI. Electronically Signed   By: Lorenza Cambridge M.D.   On: 04/07/2023 16:08           LOS: 3 days   Time spent= 35 mins    Aasha Dina Joline Maxcy, MD [facility]  If 7PM-7AM, please contact night-coverage  04/09/2023, 11:26 AM

## 2023-04-10 ENCOUNTER — Other Ambulatory Visit (HOSPITAL_COMMUNITY): Payer: Self-pay

## 2023-04-10 ENCOUNTER — Telehealth (HOSPITAL_COMMUNITY): Payer: Self-pay

## 2023-04-10 DIAGNOSIS — I63219 Cerebral infarction due to unspecified occlusion or stenosis of unspecified vertebral arteries: Secondary | ICD-10-CM | POA: Diagnosis not present

## 2023-04-10 LAB — CBC WITH DIFFERENTIAL/PLATELET
Abs Immature Granulocytes: 0.04 10*3/uL (ref 0.00–0.07)
Basophils Absolute: 0.1 10*3/uL (ref 0.0–0.1)
Basophils Relative: 2 %
Eosinophils Absolute: 0.5 10*3/uL (ref 0.0–0.5)
Eosinophils Relative: 7 %
HCT: 37 % — ABNORMAL LOW (ref 39.0–52.0)
Hemoglobin: 12.4 g/dL — ABNORMAL LOW (ref 13.0–17.0)
Immature Granulocytes: 1 %
Lymphocytes Relative: 39 %
Lymphs Abs: 2.8 10*3/uL (ref 0.7–4.0)
MCH: 30.5 pg (ref 26.0–34.0)
MCHC: 33.5 g/dL (ref 30.0–36.0)
MCV: 90.9 fL (ref 80.0–100.0)
Monocytes Absolute: 0.8 10*3/uL (ref 0.1–1.0)
Monocytes Relative: 10 %
Neutro Abs: 3 10*3/uL (ref 1.7–7.7)
Neutrophils Relative %: 41 %
Platelets: 240 10*3/uL (ref 150–400)
RBC: 4.07 MIL/uL — ABNORMAL LOW (ref 4.22–5.81)
RDW: 14.3 % (ref 11.5–15.5)
WBC: 7.3 10*3/uL (ref 4.0–10.5)
nRBC: 0 % (ref 0.0–0.2)

## 2023-04-10 LAB — MAGNESIUM: Magnesium: 2 mg/dL (ref 1.7–2.4)

## 2023-04-10 LAB — BASIC METABOLIC PANEL
Anion gap: 8 (ref 5–15)
BUN: 23 mg/dL (ref 8–23)
CO2: 20 mmol/L — ABNORMAL LOW (ref 22–32)
Calcium: 8.9 mg/dL (ref 8.9–10.3)
Chloride: 111 mmol/L (ref 98–111)
Creatinine, Ser: 1.64 mg/dL — ABNORMAL HIGH (ref 0.61–1.24)
GFR, Estimated: 38 mL/min — ABNORMAL LOW (ref >60)
Glucose, Bld: 159 mg/dL — ABNORMAL HIGH (ref 70–99)
Potassium: 3.9 mmol/L (ref 3.5–5.1)
Sodium: 139 mmol/L (ref 135–145)

## 2023-04-10 LAB — GLUCOSE, CAPILLARY
Glucose-Capillary: 200 mg/dL — ABNORMAL HIGH (ref 70–99)
Glucose-Capillary: 145 mg/dL — ABNORMAL HIGH (ref 70–99)
Glucose-Capillary: 233 mg/dL — ABNORMAL HIGH (ref 70–99)
Glucose-Capillary: 232 mg/dL — ABNORMAL HIGH (ref 70–99)
Glucose-Capillary: 198 mg/dL — ABNORMAL HIGH (ref 70–99)

## 2023-04-10 LAB — CREATININE, SERUM
Creatinine, Ser: 1.45 mg/dL — ABNORMAL HIGH (ref 0.61–1.24)
GFR, Estimated: 44 mL/min — ABNORMAL LOW (ref >60)

## 2023-04-10 MED ORDER — ROPINIROLE HCL 1 MG PO TABS
0.5000 mg | ORAL_TABLET | Freq: Two times a day (BID) | ORAL | Status: DC
  Administered 2023-04-10 – 2023-04-11 (×3): 0.5 mg via ORAL
  Filled 2023-04-10 (×3): qty 1

## 2023-04-10 MED ORDER — SODIUM CHLORIDE 0.9 % IV BOLUS
500.0000 mL | Freq: Once | INTRAVENOUS | Status: AC
  Administered 2023-04-10: 500 mL via INTRAVENOUS

## 2023-04-10 NOTE — Progress Notes (Signed)
PROGRESS NOTE    Joseph [name]  QMV:[id number] DOB: 1925/04/16 DOA: 04/06/2023 PCP: Bosie Clos, MD   Brief Narrative:  Joseph [name] is a 89 y.o. male with medical history significant of stroke with right-sided weakness 11/2022, HTN, HLD, DM, CAD, CABG, dCHF, hypothyroidism, CKD-3A, who presents with generalized weakness and mild left facial droop.  Patient diagnosed with acute CVA on MRI.  Echocardiogram also showed EF of 45% and new onset atrial fibrillation therefore cardiology team consulted.  Neurology recommending DAPT but patient was started on Eliquis due to A-fib.  Assessment & Plan:  Principal Problem:   Stroke [facility]) Active Problems:   New onset atrial fibrillation (HCC)   Essential hypertension   Coronary artery disease   Type II diabetes mellitus with renal manifestations (HCC)   Chronic kidney disease, stage 3a (HCC)   Mixed hyperlipidemia   Hypothyroidism   COPD (chronic obstructive pulmonary disease) (HCC)   Chronic diastolic CHF (congestive heart failure) (HCC)   Obesity (BMI 30-39.9)   Weakness   Acute combined systolic and diastolic heart failure (HCC)      Acute ischemic stroke [facility]):  Patient found to have acute CVA on MRI.  Also had new onset atrial fibrillation.  CTA did not show any large vessel occlusion.  A1c 6.9.  Neurology recommending aspirin and Plavix for total of 3 months followed by aspirin alone but stopped as patient is now on Eliquis for A-fib.  PT/OT has recommended SNF.  AKI on chronic kidney disease, stage 3a [facility]): Renal function is improved  Baseline creatinine 1.1.  Trended up to 1.64.  Hold losartan.  NS 500 cc bolus    New onset atrial fibrillation Joseph [name]): CHA2DS2-VASc Score is 7,  Currently rate is under control.  Eliquis twice daily   Hypokalemia-continue repletion and monitoring   Hyperlipidemia, mixed -Continue fenofibrate   Essential hypertension Slowly resume antihypertensive meds.  We can start normalizing  blood pressure.  Will defer adding cardiac medicines   Coronary artery disease: s/p of CABG. No chest pain -Continue ASA Not on beta-blockers on account of bradycardia   Type II diabetes mellitus with renal manifestations [facility]):: Recent A1c 7.4, poorly controlled.   -SSI -Glargine insulin 30 units daily.  Patient is allergic to [place] -At the facility would request patient to continue his libre monitor and continue his home diabetic regimen    Hypothyroidism -Continue levothyroxine   COPD (chronic obstructive pulmonary disease) (HCC): Stable -As needed bronchodilators   Chronic diastolic CHF (congestive heart failure) (HCC):  Echocardiogram shows EF of 45%.   Cardiac med adjustment per cardiology team   Obesity (BMI 30-39.9): Body weight 94.3 kg, BMI 32.58 Supportive care  Restless leg syndrome - Started on ropinirole  PT/OT-SNF   DVT ppx: Eliquis   Code Status: Full code per pt      Disposition Plan: Awaiting SNF placement Daughter is at bedside   Consults:  cardiology and Joseph [name] of neurology are consulted.        Diet Orders (From admission, onward)     Start     Ordered   04/07/23 1544  Diet heart healthy/carb modified Fluid consistency: Thin  Diet effective now       Comments: NO STRAWS!  Question:  Fluid consistency:  Answer:  Thin   04/07/23 1543            Subjective:  Doing well no complaints.  Tells me he has had some restless leg issues especially at night This morning  sitting up in the recliner.  Daughter present at bedside  Examination: Constitutional: Not in acute distress.  Elderly frail Respiratory: Clear to auscultation bilaterally Cardiovascular: Normal sinus rhythm, no rubs Abdomen: Nontender nondistended good bowel sounds Musculoskeletal: No edema noted Skin: No rashes seen Neurologic: CN 2-12 grossly intact.  And nonfocal Psychiatric: Normal judgment and insight. Alert and oriented x 3. Normal  mood.   Objective: Vitals:   04/09/23 1420 04/09/23 1616 04/09/23 2026 04/10/23 0036  BP: 139/68 (!) 117/57 (!) 141/55 (!) 127/59  Pulse: 86 87 70 95  Resp: 18 18 18 18   Temp: 98 F (36.7 C) 98.1 F (36.7 C) 97.8 F (36.6 C) 97.7 F (36.5 C)  TempSrc: Oral  Oral Oral  SpO2: 96% 95% 96% 97%  Weight:      Height:        Intake/Output Summary (Last 24 hours) at 04/10/2023 0814 Last data filed at 04/09/2023 1928 Gross per 24 hour  Intake 480 ml  Output --  Net 480 ml   Filed Weights   04/06/23 1202  Weight: 94.3 kg    Scheduled Meds:  apixaban  5 mg Oral BID   fenofibrate  54 mg Oral Daily   hydrALAZINE  50 mg Oral Q8H   insulin aspart  0-5 Units Subcutaneous QHS   insulin aspart  0-9 Units Subcutaneous TID WC   insulin glargine-yfgn  30 Units Subcutaneous Daily   levothyroxine  50 mcg Oral QAC breakfast   magnesium oxide  200 mg Oral Daily   melatonin  5 mg Oral QHS   pantoprazole  40 mg Oral Daily   Continuous Infusions:  sodium chloride      Nutritional status     Body mass index is 32.58 kg/m.  Data Reviewed:   CBC: Recent Labs  Lab 04/06/23 1206 04/07/23 0953 04/08/23 0408 04/09/23 0423 04/10/23 0418  WBC 6.6 4.8 5.5 6.9 7.3  NEUTROABS  --  2.0 2.1 3.3 3.0  HGB 12.9* 12.8* 11.9* 12.5* 12.4*  HCT 38.7* 38.4* 35.5* 37.9* 37.0*  MCV 90.8 89.3 89.6 91.3 90.9  PLT 256 237 232 227 240   Basic Metabolic Panel: Recent Labs  Lab 04/06/23 1206 04/07/23 0953 04/08/23 0408 04/09/23 0423 04/10/23 0418  NA 137 139 140 137 139  K 3.6 3.6 3.4* 3.6 3.9  CL 105 110 111 109 111  CO2 24 23 23  21* 20*  GLUCOSE 240* 168* 126* 157* 159*  BUN 19 16 16 16 23   CREATININE 1.32* 1.09 1.17 1.17 1.64*  CALCIUM 8.7* 8.7* 8.6* 8.6* 8.9  MG  --   --   --   --  2.0   GFR: Estimated Creatinine Clearance: 29.5 mL/min (A) (by C-G formula based on SCr of 1.64 mg/dL (H)). Liver Function Tests: Recent Labs  Lab 04/06/23 1206  AST 30  ALT 15  ALKPHOS 39   BILITOT 1.0  PROT 6.3*  ALBUMIN 3.3*   No results for input(s): "LIPASE", "AMYLASE" in the last 168 hours. No results for input(s): "AMMONIA" in the last 168 hours. Coagulation Profile: Recent Labs  Lab 04/06/23 2128  INR 1.1   Cardiac Enzymes: No results for input(s): "CKTOTAL", "CKMB", "CKMBINDEX", "TROPONINI" in the last 168 hours. BNP (last 3 results) No results for input(s): "PROBNP" in the last 8760 hours. HbA1C: No results for input(s): "HGBA1C" in the last 72 hours.  CBG: Recent Labs  Lab 04/08/23 1643 04/09/23 0802 04/09/23 1130 04/09/23 1619 04/09/23 2027  GLUCAP 238* 160* 267* 243*  245*   Lipid Profile: No results for input(s): "CHOL", "HDL", "LDLCALC", "TRIG", "CHOLHDL", "LDLDIRECT" in the last 72 hours.  Thyroid Function Tests: No results for input(s): "TSH", "T4TOTAL", "FREET4", "T3FREE", "THYROIDAB" in the last 72 hours.  Anemia Panel: No results for input(s): "VITAMINB12", "FOLATE", "FERRITIN", "TIBC", "IRON", "RETICCTPCT" in the last 72 hours. Sepsis Labs: No results for input(s): "PROCALCITON", "LATICACIDVEN" in the last 168 hours.  No results found for this or any previous visit (from the past 240 hour(s)).       Radiology Studies: No results found.         LOS: 4 days   Time spent= 35 mins    Joseph Parrow Joline Maxcy, MD [facility]  If 7PM-7AM, please contact night-coverage  04/10/2023, 8:14 AM

## 2023-04-10 NOTE — Progress Notes (Signed)
Physical Therapy Treatment Patient Details Name: Joseph [name] MRN: [id number] DOB: [birth year] Today's Date: 04/10/2023   History of Present Illness Joseph [name] is a 89 y.o. male with medical history significant of stroke with right-sided weakness 11/2022, HTN, HLD, DM, CAD, CABG, dCHF, hypothyroidism, CKD-3A, who presents with generalized weakness and mild left facial droop.    PT Comments    Pt resting in recliner upon PT arrival; pt's caregiver present.  During session pt min assist with transfers using RW; CGA to min assist to ambulate 95 feet with RW use (limited distance ambulating d/t pt fatigue/generalized weakness); and min assist to lay down in bed end of session.  Pt given warm blankets to improve comfort resting in bed end of session.  Will continue to focus on strengthening, balance, and progressive functional mobility during hospitalization.      Recommendations for follow up therapy are one component of a multi-disciplinary discharge planning process, led by the attending physician.  Recommendations may be updated based on patient status, additional functional criteria and insurance authorization.  Follow Up Recommendations  Can patient physically be transported by private vehicle: Yes    Assistance Recommended at Discharge Frequent or constant Supervision/Assistance  Patient can return home with the following A little help with walking and/or transfers;Assistance with cooking/housework;Assist for transportation;A little help with bathing/dressing/bathroom;Help with stairs or ramp for entrance   Equipment Recommendations  None recommended by PT    Recommendations for Other Services       Precautions / Restrictions Precautions Precautions: Fall Restrictions Weight Bearing Restrictions: No     Mobility  Bed Mobility Overal bed mobility: Needs Assistance Bed Mobility: Sit to Supine       Sit to supine: Min assist   General bed mobility comments: assist  for LE's    Transfers Overall transfer level: Needs assistance Equipment used: Rolling walker (2 wheels) Transfers: Sit to/from Stand Sit to Stand: Min assist   Step pivot transfers: Min assist       General transfer comment: vc's for UE placement; increased effort to stand up from recliner; stand step turn transport chair to bed with UE support    Ambulation/Gait Ambulation/Gait assistance: Min guard, Min assist Gait Distance (Feet): 95 Feet Assistive device: Rolling walker (2 wheels) Gait Pattern/deviations: Step-through pattern, Trunk flexed       General Gait Details: R LE appearing ataxic in swing phase (anticipate d/t prior CVA) with mild decreased stance time R LE   Stairs             Wheelchair Mobility    Modified Rankin (Stroke Patients Only)       Balance Overall balance assessment: Needs assistance Sitting-balance support: Feet supported, No upper extremity supported Sitting balance-Leahy Scale: Good Sitting balance - Comments: steady sitting reaching within BOS   Standing balance support: Bilateral upper extremity supported, During functional activity, Reliant on assistive device for balance Standing balance-Leahy Scale: Fair Standing balance comment: requires UE support for balance                            Cognition Arousal/Alertness: Awake/alert Behavior During Therapy: WFL for tasks assessed/performed Overall Cognitive Status: Within Functional Limits for tasks assessed                                          Exercises  General Comments  Nursing cleared pt for participation in physical therapy.  Pt agreeable to PT session.  Pt's caregiver present during session.      Pertinent Vitals/Pain Pain Assessment Pain Assessment: No/denies pain Pt's HR noted to be around 88 bpm at rest and 120 bpm in sitting post ambulation.    Home Living                          Prior Function             PT Goals (current goals can now be found in the care plan section) Acute Rehab PT Goals Patient Stated Goal: to go home if safe to do so PT Goal Formulation: With patient/family Time For Goal Achievement: 04/21/23 Potential to Achieve Goals: Fair Progress towards PT goals: Progressing toward goals    Frequency    Min 4X/week      PT Plan Current plan remains appropriate    Co-evaluation              AM-PAC PT "6 Clicks" Mobility   Outcome Measure  Help needed turning from your back to your side while in a flat bed without using bedrails?: A Little Help needed moving from lying on your back to sitting on the side of a flat bed without using bedrails?: A Lot Help needed moving to and from a bed to a chair (including a wheelchair)?: A Little Help needed standing up from a chair using your arms (e.g., wheelchair or bedside chair)?: A Little Help needed to walk in hospital room?: A Little Help needed climbing 3-5 steps with a railing? : A Lot 6 Click Score: 16    End of Session Equipment Utilized During Treatment: Gait belt Activity Tolerance: Patient limited by fatigue Patient left: in bed;with call bell/phone within reach;with bed alarm set;with family/visitor present Nurse Communication: Precautions;Mobility status;Other (comment) (Pt's HR during session) PT Visit Diagnosis: Unsteadiness on feet (R26.81);Muscle weakness (generalized) (M62.81);Difficulty in walking, not elsewhere classified (R26.2)     Time: 1610-9604 PT Time Calculation (min) (ACUTE ONLY): 21 min  Charges:  $Therapeutic Activity: 8-22 mins                     Hendricks Limes, PT 04/10/23, 2:36 PM

## 2023-04-10 NOTE — TOC Progression Note (Signed)
Transition of Care [facility]) - Progression Note    Patient Details  Name: [name] MRN: [id number] Date of Birth: 1925-06-13  Transition of Care [facility]) CM/SW Contact  Allena Katz, LCSW Phone Number: 04/10/2023, 3:41 PM  Clinical Narrative:   Berkley Harvey started for [facility] for tomorrow. Sue Lush states she may not have a bed but will try to accomodate and is agreeable to [place] starting auth.      Barriers to Discharge: Continued Medical Work up  Expected Discharge Plan and Services                                               Social Determinants of Health (SDOH) Interventions SDOH Screenings   Food Insecurity: No Food Insecurity (04/06/2023)  Housing: Low Risk  (04/06/2023)  Transportation Needs: No Transportation Needs (04/06/2023)  Utilities: Not At Risk (04/06/2023)  Alcohol Screen: Low Risk  (03/02/2022)  Depression (PHQ2-9): Low Risk  (01/13/2023)  Financial Resource Strain: Low Risk  (03/02/2022)  Physical Activity: Insufficiently Active (03/02/2022)  Social Connections: Moderately Integrated (03/02/2022)  Stress: No Stress Concern Present (03/02/2022)  Tobacco Use: Medium Risk (01/13/2023)    Readmission Risk Interventions    12/10/2022    3:58 PM  Readmission Risk Prevention Plan  Transportation Screening Complete  PCP or Specialist Appt within 5-7 Days Complete  Home Care Screening Complete  Medication Review (RN CM) Complete

## 2023-04-10 NOTE — TOC Progression Note (Signed)
Transition of Care [facility]) - Progression Note    Patient Details  Name: Joseph [name] MRN: [id number] Date of Birth: 1925-03-15  Transition of Care [facility]) CM/SW Contact  Allena Katz, LCSW Phone Number: 04/10/2023, 3:13 PM  Clinical Narrative:   Family would like to go to [facility]. Sue Lush at [facility] reviewing and trying to accommodate a bed for patient. CSW holding off on starting auth until andrea states they have a bed for pt.       Barriers to Discharge: Continued Medical Work up  Expected Discharge Plan and Services                                               Social Determinants of Health (SDOH) Interventions SDOH Screenings   Food Insecurity: No Food Insecurity (04/06/2023)  Housing: Low Risk  (04/06/2023)  Transportation Needs: No Transportation Needs (04/06/2023)  Utilities: Not At Risk (04/06/2023)  Alcohol Screen: Low Risk  (03/02/2022)  Depression (PHQ2-9): Low Risk  (01/13/2023)  Financial Resource Strain: Low Risk  (03/02/2022)  Physical Activity: Insufficiently Active (03/02/2022)  Social Connections: Moderately Integrated (03/02/2022)  Stress: No Stress Concern Present (03/02/2022)  Tobacco Use: Medium Risk (01/13/2023)    Readmission Risk Interventions    12/10/2022    3:58 PM  Readmission Risk Prevention Plan  Transportation Screening Complete  PCP or Specialist Appt within 5-7 Days Complete  Home Care Screening Complete  Medication Review (RN CM) Complete

## 2023-04-10 NOTE — TOC Benefit Eligibility Note (Signed)
Patient Product/process development scientist completed.    The patient is currently admitted and upon discharge could be taking [place].  The current 30 day co-pay is $0   The patient is currently admitted and upon discharge could be taking Jardiance.  The current 30 day co-pay is $0   The patient is insured through [employer]     This test claim was processed through [facility]- copay amounts may vary at other pharmacies due to pharmacy/plan contracts, or as the patient moves through the different stages of their insurance plan.

## 2023-04-10 NOTE — Progress Notes (Signed)
Patient I/v removed due to infiltration. Patient refused to get another I/v on him.MD notified.

## 2023-04-10 NOTE — Telephone Encounter (Signed)
Pharmacy Patient Advocate Encounter  Insurance verification completed.    The patient is insured through [employer]   The patient is currently admitted and ran test claims for the following: Jardiance, Farxiga.  Copays and coinsurance results were relayed to Inpatient clinical team.    

## 2023-04-10 NOTE — NC FL2 (Signed)
[place] MEDICAID FL2 LEVEL OF CARE FORM     IDENTIFICATION  Patient Name: Joseph [name] Birthdate: March 03, 1925 Sex: male Admission Date (Current Location): 04/06/2023  [place] and [place] Number:  Chiropodist and Address:  [facility], [address] [place], [place] [address]      Provider Number: [id number]  Attending Physician Name and Address:  Dimple Nanas, MD  Relative Name and Phone Number:  Fairley, Gotay (Daughter) [phone]    Current Level of Care: Hospital Recommended Level of Care: Skilled Nursing Facility Prior Approval Number:    Date Approved/Denied:   PASRR Number: [id number]  Discharge Plan: SNF    Current Diagnoses: Patient Active Problem List   Diagnosis Date Noted   Stroke [facility]) 04/06/2023   Type II diabetes mellitus with renal manifestations (HCC) 04/06/2023   Chronic kidney disease, stage 3a (HCC) 04/06/2023   COPD (chronic obstructive pulmonary disease) (HCC) 04/06/2023   Chronic diastolic CHF (congestive heart failure) (HCC) 04/06/2023   New onset atrial fibrillation (HCC) 04/06/2023   Obesity (BMI 30-39.9) 04/06/2023   Acute cerebrovascular accident (CVA) (HCC) 12/09/2022   Chronic combined systolic and diastolic CHF (congestive heart failure) (HCC) 12/09/2022   Fall at home, initial encounter 12/09/2022   Hypokalemia 12/09/2022   Hemiparesis affecting right side as late effect of cerebrovascular accident (CVA) (HCC) 12/09/2022   Skin tear of left hand without complication 11/16/2022   Pain of left hip 11/16/2022   Left hip pain 11/10/2022   Intertrigo 11/10/2022   COPD with acute exacerbation (HCC) 10/06/2022   Diabetes mellitus (HCC) 06/10/2022   Enlarged prostate 03/02/2022   Other specified counseling 03/02/2022   Renal calculi 03/02/2022   Gastroesophageal reflux disease 03/02/2022   Hypertensive disorder 03/02/2022   Mixed hyperlipidemia 03/02/2022   Hypothyroidism 03/02/2022    Acute non-recurrent frontal sinusitis 01/05/2022   Glands swollen 01/05/2022   Hypertension associated with diabetes (HCC) 01/05/2022   Acute rhinitis 01/05/2022   AV block, Mobitz 1    NSTEMI (non-ST elevated myocardial infarction) (HCC)    ACS (acute coronary syndrome) (HCC) 12/16/2019   Diverticulitis of large intestine without perforation or abscess without bleeding    Pain of upper abdomen    SOB (shortness of breath)    S/P CABG (coronary artery bypass graft)    Morbid obesity due to excess calories (HCC)    Back pain, chronic 07/28/2015   Benign fibroma of prostate 07/28/2015   Acid reflux 07/28/2015   Adult hypothyroidism 07/28/2015   Neuropathy 07/28/2015   Arthritis, degenerative 07/28/2015   Psoriasis 07/28/2015   Lumbar spondylosis 05/08/2015   Obesity 09/09/2014   Bilateral leg weakness 09/09/2014   Acute combined systolic and diastolic heart failure (HCC) 05/13/2014   Bilateral leg edema 04/15/2014   Bradycardia 08/13/2013   Edema 06/21/2012   Weakness 09/15/2011   Type 2 diabetes mellitus with diabetic peripheral angiopathy without gangrene, with long-term current use of insulin (HCC) 09/15/2011   DYSPNEA 06/09/2009   Hyperlipidemia 06/07/2009   Essential hypertension 06/07/2009   Coronary artery disease 06/07/2009    Orientation RESPIRATION BLADDER Height & Weight     Self, Time, Situation, Place  Normal Continent Weight: 208 lb (94.3 kg) Height:  5\' 7"  (170.2 cm)  BEHAVIORAL SYMPTOMS/MOOD NEUROLOGICAL BOWEL NUTRITION STATUS      Continent Diet  AMBULATORY STATUS COMMUNICATION OF NEEDS Skin   Limited Assist   Normal  Personal Care Assistance Level of Assistance  Bathing, Feeding, Dressing Bathing Assistance: Limited assistance Feeding assistance: Limited assistance Dressing Assistance: Limited assistance     Functional Limitations Info  Sight, Hearing, Speech Sight Info: Impaired Hearing Info: Adequate Speech Info:  Adequate    SPECIAL CARE FACTORS FREQUENCY  PT (By licensed PT), OT (By licensed OT)     PT Frequency: 5 Times a week OT Frequency: 5 times a week            Contractures Contractures Info: Not present    Additional Factors Info  Code Status, Allergies Code Status Info: FULL Allergies Info: Farxiga (dapagliflozin)           Current Medications (04/10/2023):  This is the current hospital active medication list Current Facility-Administered Medications  Medication Dose Route Frequency Provider Last Rate Last Admin   acetaminophen (TYLENOL) tablet 650 mg  650 mg Oral Q4H PRN Lorretta Harp, MD   650 mg at 04/09/23 2106   Or   acetaminophen (TYLENOL) 160 MG/5ML solution 650 mg  650 mg Per Tube Q4H PRN Lorretta Harp, MD       Or   acetaminophen (TYLENOL) suppository 650 mg  650 mg Rectal Q4H PRN Lorretta Harp, MD       apixaban Everlene Balls) tablet 5 mg  5 mg Oral BID Antonieta Iba, MD   5 mg at 04/10/23 1610   fenofibrate tablet 54 mg  54 mg Oral Daily Lorretta Harp, MD   54 mg at 04/10/23 0910   guaiFENesin (ROBITUSSIN) 100 MG/5ML liquid 5 mL  5 mL Oral Q4H PRN Amin, Ankit Chirag, MD       hydrALAZINE (APRESOLINE) injection 5 mg  5 mg Intravenous Q2H PRN Lorretta Harp, MD       hydrALAZINE (APRESOLINE) tablet 50 mg  50 mg Oral Q8H Rosezetta Schlatter T, MD   50 mg at 04/10/23 1442   insulin aspart (novoLOG) injection 0-5 Units  0-5 Units Subcutaneous QHS Lorretta Harp, MD   2 Units at 04/09/23 2106   insulin aspart (novoLOG) injection 0-9 Units  0-9 Units Subcutaneous TID WC Lorretta Harp, MD   3 Units at 04/10/23 1206   insulin glargine-yfgn (SEMGLEE) injection 30 Units  30 Units Subcutaneous Daily Lorretta Harp, MD   30 Units at 04/10/23 0910   ipratropium-albuterol (DUONEB) 0.5-2.5 (3) MG/3ML nebulizer solution 3 mL  3 mL Nebulization Q4H PRN Amin, Ankit Chirag, MD       levothyroxine (SYNTHROID) tablet 50 mcg  50 mcg Oral QAC breakfast Lorretta Harp, MD   50 mcg at 04/10/23 0536   magnesium oxide (MAG-OX)  tablet 200 mg  200 mg Oral Daily Lorretta Harp, MD   200 mg at 04/10/23 9604   melatonin tablet 5 mg  5 mg Oral QHS Milon Dikes, MD   5 mg at 04/08/23 2207   nitroGLYCERIN (NITROSTAT) SL tablet 0.4 mg  0.4 mg Sublingual Q5 min PRN Lorretta Harp, MD       ondansetron [facility]) injection 4 mg  4 mg Intravenous Q8H PRN Lorretta Harp, MD       pantoprazole (PROTONIX) EC tablet 40 mg  40 mg Oral Daily Lorretta Harp, MD   40 mg at 04/10/23 5409   rOPINIRole (REQUIP) tablet 0.5 mg  0.5 mg Oral BID Amin, Ankit Chirag, MD   0.5 mg at 04/10/23 1112   senna-docusate (Senokot-S) tablet 1 tablet  1 tablet Oral QHS PRN Lorretta Harp, MD       sodium  chloride (OCEAN) 0.65 % nasal spray 1 spray  1 spray Each Nare PRN Amin, Loura Halt, MD   1 spray at 04/09/23 1421   traZODone (DESYREL) tablet 50 mg  50 mg Oral QHS PRN Amin, Loura Halt, MD         Discharge Medications: Please see discharge summary for a list of discharge medications.  Relevant Imaging Results:  Relevant Lab Results:   Additional Information SS [id number]  Allena Katz, LCSW

## 2023-04-10 NOTE — Consult Note (Signed)
PHARMACY CONSULT NOTE - ELECTROLYTES  Pharmacy Consult for Electrolyte Monitoring and Replacement   Recent Labs: Potassium (mmol/L)  Date Value  04/10/2023 3.9   Magnesium (mg/dL)  Date Value  47/82/9562 2.0   Calcium (mg/dL)  Date Value  13/06/6577 8.9   Albumin (g/dL)  Date Value  46/96/2952 3.3 (L)  06/01/2022 3.9   Phosphorus (mg/dL)  Date Value  84/13/2440 3.4   Sodium (mmol/L)  Date Value  04/10/2023 139  06/01/2022 139   Corrected Ca: 9.2 mg/dL  Assessment  Joseph [name] is a 89 y.o. male presenting with stroke. PMH significant for HTN, HLD, DM, CAD, CABG, dCHF, hypothyroidism, CKD3a. Pharmacy has been consulted to monitor and replace electrolytes. Patient has new onset afib.   Diet: regular  Goal of Therapy: Electrolytes WNL  Plan:  No need for replacement today F/u with AM labs if ordered.   Thank you for allowing pharmacy to be a part of this patient's care.  Celene Squibb, PharmD PGY1 Pharmacy Resident 04/10/2023 6:54 AM

## 2023-04-11 DIAGNOSIS — Z7901 Long term (current) use of anticoagulants: Secondary | ICD-10-CM | POA: Diagnosis not present

## 2023-04-11 DIAGNOSIS — I4891 Unspecified atrial fibrillation: Secondary | ICD-10-CM | POA: Diagnosis not present

## 2023-04-11 DIAGNOSIS — I2510 Atherosclerotic heart disease of native coronary artery without angina pectoris: Secondary | ICD-10-CM | POA: Diagnosis not present

## 2023-04-11 DIAGNOSIS — N1831 Chronic kidney disease, stage 3a: Secondary | ICD-10-CM | POA: Diagnosis not present

## 2023-04-11 DIAGNOSIS — E1151 Type 2 diabetes mellitus with diabetic peripheral angiopathy without gangrene: Secondary | ICD-10-CM | POA: Diagnosis not present

## 2023-04-11 DIAGNOSIS — E876 Hypokalemia: Secondary | ICD-10-CM | POA: Diagnosis not present

## 2023-04-11 DIAGNOSIS — G2581 Restless legs syndrome: Secondary | ICD-10-CM | POA: Diagnosis not present

## 2023-04-11 DIAGNOSIS — I63219 Cerebral infarction due to unspecified occlusion or stenosis of unspecified vertebral arteries: Secondary | ICD-10-CM | POA: Diagnosis not present

## 2023-04-11 DIAGNOSIS — I63419 Cerebral infarction due to embolism of unspecified middle cerebral artery: Secondary | ICD-10-CM | POA: Diagnosis not present

## 2023-04-11 DIAGNOSIS — J449 Chronic obstructive pulmonary disease, unspecified: Secondary | ICD-10-CM | POA: Diagnosis not present

## 2023-04-11 DIAGNOSIS — Z951 Presence of aortocoronary bypass graft: Secondary | ICD-10-CM | POA: Diagnosis not present

## 2023-04-11 DIAGNOSIS — E782 Mixed hyperlipidemia: Secondary | ICD-10-CM | POA: Diagnosis not present

## 2023-04-11 DIAGNOSIS — I25708 Atherosclerosis of coronary artery bypass graft(s), unspecified, with other forms of angina pectoris: Secondary | ICD-10-CM | POA: Diagnosis not present

## 2023-04-11 DIAGNOSIS — R040 Epistaxis: Secondary | ICD-10-CM | POA: Diagnosis not present

## 2023-04-11 DIAGNOSIS — E1122 Type 2 diabetes mellitus with diabetic chronic kidney disease: Secondary | ICD-10-CM | POA: Diagnosis not present

## 2023-04-11 DIAGNOSIS — N179 Acute kidney failure, unspecified: Secondary | ICD-10-CM | POA: Diagnosis not present

## 2023-04-11 DIAGNOSIS — I6300 Cerebral infarction due to thrombosis of unspecified precerebral artery: Secondary | ICD-10-CM | POA: Diagnosis not present

## 2023-04-11 DIAGNOSIS — I5042 Chronic combined systolic (congestive) and diastolic (congestive) heart failure: Secondary | ICD-10-CM | POA: Diagnosis not present

## 2023-04-11 DIAGNOSIS — Z8673 Personal history of transient ischemic attack (TIA), and cerebral infarction without residual deficits: Secondary | ICD-10-CM | POA: Diagnosis not present

## 2023-04-11 DIAGNOSIS — E039 Hypothyroidism, unspecified: Secondary | ICD-10-CM | POA: Diagnosis not present

## 2023-04-11 DIAGNOSIS — G629 Polyneuropathy, unspecified: Secondary | ICD-10-CM | POA: Diagnosis not present

## 2023-04-11 DIAGNOSIS — I252 Old myocardial infarction: Secondary | ICD-10-CM | POA: Diagnosis not present

## 2023-04-11 DIAGNOSIS — K219 Gastro-esophageal reflux disease without esophagitis: Secondary | ICD-10-CM | POA: Diagnosis not present

## 2023-04-11 DIAGNOSIS — I4821 Permanent atrial fibrillation: Secondary | ICD-10-CM | POA: Diagnosis not present

## 2023-04-11 DIAGNOSIS — I10 Essential (primary) hypertension: Secondary | ICD-10-CM | POA: Diagnosis not present

## 2023-04-11 DIAGNOSIS — I130 Hypertensive heart and chronic kidney disease with heart failure and stage 1 through stage 4 chronic kidney disease, or unspecified chronic kidney disease: Secondary | ICD-10-CM | POA: Diagnosis not present

## 2023-04-11 DIAGNOSIS — I69398 Other sequelae of cerebral infarction: Secondary | ICD-10-CM | POA: Diagnosis not present

## 2023-04-11 LAB — BASIC METABOLIC PANEL
Anion gap: 8 (ref 5–15)
BUN: 20 mg/dL (ref 8–23)
CO2: 21 mmol/L — ABNORMAL LOW (ref 22–32)
Calcium: 9.2 mg/dL (ref 8.9–10.3)
Chloride: 111 mmol/L (ref 98–111)
Creatinine, Ser: 1.29 mg/dL — ABNORMAL HIGH (ref 0.61–1.24)
GFR, Estimated: 51 mL/min — ABNORMAL LOW (ref >60)
Glucose, Bld: 189 mg/dL — ABNORMAL HIGH (ref 70–99)
Potassium: 3.8 mmol/L (ref 3.5–5.1)
Sodium: 140 mmol/L (ref 135–145)

## 2023-04-11 LAB — MAGNESIUM: Magnesium: 1.9 mg/dL (ref 1.7–2.4)

## 2023-04-11 LAB — GLUCOSE, CAPILLARY
Glucose-Capillary: 169 mg/dL — ABNORMAL HIGH (ref 70–99)
Glucose-Capillary: 260 mg/dL — ABNORMAL HIGH (ref 70–99)

## 2023-04-11 MED ORDER — INSULIN ASPART 100 UNIT/ML IJ SOLN
0.0000 [IU] | Freq: Three times a day (TID) | INTRAMUSCULAR | Status: DC
  Administered 2023-04-11: 8 [IU] via SUBCUTANEOUS
  Filled 2023-04-11: qty 1

## 2023-04-11 MED ORDER — INSULIN ASPART 100 UNIT/ML IJ SOLN: 0.0000 [IU] | Freq: Every day | INTRAMUSCULAR | Status: DC

## 2023-04-11 MED ORDER — HYDRALAZINE HCL 50 MG PO TABS: 50.0000 mg | ORAL_TABLET | Freq: Three times a day (TID) | ORAL | Status: DC

## 2023-04-11 MED ORDER — POTASSIUM CHLORIDE CRYS ER 20 MEQ PO TBCR
20.0000 meq | EXTENDED_RELEASE_TABLET | Freq: Once | ORAL | Status: AC
  Administered 2023-04-11: 20 meq via ORAL
  Filled 2023-04-11: qty 1

## 2023-04-11 MED ORDER — APIXABAN 5 MG PO TABS: 5.0000 mg | ORAL_TABLET | Freq: Two times a day (BID) | ORAL | Status: DC

## 2023-04-11 MED ORDER — ROPINIROLE HCL 0.5 MG PO TABS: 0.5000 mg | ORAL_TABLET | Freq: Two times a day (BID) | ORAL | Status: DC

## 2023-04-11 NOTE — Consult Note (Signed)
PHARMACY CONSULT NOTE - ELECTROLYTES  Pharmacy Consult for Electrolyte Monitoring and Replacement   Recent Labs: Potassium (mmol/L)  Date Value  04/11/2023 3.8   Magnesium (mg/dL)  Date Value  81/19/1478 1.9   Calcium (mg/dL)  Date Value  29/56/2130 9.2   Albumin (g/dL)  Date Value  86/57/8469 3.3 (L)  06/01/2022 3.9   Phosphorus (mg/dL)  Date Value  62/95/2841 3.4   Sodium (mmol/L)  Date Value  04/11/2023 140  06/01/2022 139    Assessment  Joseph [name] is a 89 y.o. male presenting with stroke. PMH significant for HTN, HLD, DM, CAD, CABG, dCHF, hypothyroidism, CKD3a. Pharmacy has been consulted to monitor and replace electrolytes. Patient has new onset afib. Losartan held due to Scr bump.   Scr is trending down.   Diet: regular  Goal of Therapy: Electrolytes WNL  Plan:  KCL 20 mEq x 1.  F/u with AM labs if ordered.   Thank you for allowing pharmacy to be a part of this patient's care.  Paschal Dopp, PharmD, BCPS 04/11/2023 7:38 AM

## 2023-04-11 NOTE — Progress Notes (Signed)
Occupational Therapy Treatment Patient Details Name: Joseph [name] MRN: [id number] DOB: May 19, 1925 Today's Date: 04/11/2023   History of present illness Joseph [name] is a 89 y.o. male with medical history significant of stroke with right-sided weakness 11/2022, HTN, HLD, DM, CAD, CABG, dCHF, hypothyroidism, CKD-3A, who presents with generalized weakness and mild left facial droop.   OT comments  Upon entering the room, pt seated in recliner chair with visitor present in room and requesting assistance with repositioning. Pt reports he recently returned from bathroom but agreeable to ambulate in room and then reposition in chair. Pt stands with min A from recliner chair and ambulates with RW with min guard 50' in room and then returning to recliner chair. Pt needing min cuing for safety awareness and for hand placement with functional transfers. Pt remains in recliner chair with visitor present and all needs within reach.    Recommendations for follow up therapy are one component of a multi-disciplinary discharge planning process, led by the attending physician.  Recommendations may be updated based on patient status, additional functional criteria and insurance authorization.    Assistance Recommended at Discharge Intermittent Supervision/Assistance  Patient can return home with the following  A little help with walking and/or transfers;A little help with bathing/dressing/bathroom;Assistance with cooking/housework;Help with stairs or ramp for entrance;Assist for transportation   Equipment Recommendations  None recommended by OT       Precautions / Restrictions Precautions Precautions: Fall       Mobility Bed Mobility               General bed mobility comments: seated in recliner chair    Transfers Overall transfer level: Needs assistance Equipment used: Rolling walker (2 wheels) Transfers: Sit to/from Stand Sit to Stand: Min assist                 Balance  Overall balance assessment: Needs assistance Sitting-balance support: Feet supported, No upper extremity supported Sitting balance-Leahy Scale: Good Sitting balance - Comments: steady sitting reaching within BOS   Standing balance support: Bilateral upper extremity supported, During functional activity, Reliant on assistive device for balance Standing balance-Leahy Scale: Fair Standing balance comment: requires UE support for balance                           ADL either performed or assessed with clinical judgement   ADL Overall ADL's : Needs assistance/impaired     Grooming: Set up;Brushing hair;Sitting                                       Vision Patient Visual Report: No change from baseline            Cognition Arousal/Alertness: Awake/alert Behavior During Therapy: WFL for tasks assessed/performed Overall Cognitive Status: Within Functional Limits for tasks assessed                                                     Pertinent Vitals/ Pain       Pain Assessment Pain Assessment: No/denies pain         Frequency  Min 3X/week        Progress Toward Goals  OT Goals(current goals can now be found  in the care plan section)  Progress towards OT goals: Progressing toward goals     Plan Discharge plan remains appropriate;Frequency remains appropriate       AM-PAC OT "6 Clicks" Daily Activity     Outcome Measure   Help from another person eating meals?: None Help from another person taking care of personal grooming?: A Little Help from another person toileting, which includes using toliet, bedpan, or urinal?: A Little Help from another person bathing (including washing, rinsing, drying)?: A Lot Help from another person to put on and taking off regular upper body clothing?: A Little Help from another person to put on and taking off regular lower body clothing?: A Lot 6 Click Score: 17    End of Session  Equipment Utilized During Treatment: Rolling walker (2 wheels)  OT Visit Diagnosis: Unsteadiness on feet (R26.81);Muscle weakness (generalized) (M62.81)   Activity Tolerance Patient tolerated treatment well   Patient Left in chair;with call bell/phone within reach;with family/visitor present   Nurse Communication Mobility status        Time: 1040-1050 OT Time Calculation (min): 10 min  Charges: OT General Charges $OT Visit: 1 Visit OT Treatments $Therapeutic Activity: 8-22 mins  Jackquline Denmark, MS, OTR/L , CBIS ascom [phone]  04/11/23, 10:54 AM

## 2023-04-11 NOTE — Consult Note (Signed)
[facility] Customer service manager [facility]) Accountable Care Organization (ACO) [facility] Liaison Note  04/11/2023  Joseph [name] 1925-06-27 [id number]  Location: [facility] RN Hospital Liaison screened the patient remotely at [facility].  Insurance: [employer]   Joseph [name] is a 89 y.o. male who is a Primary Care Patient of Bosie Clos, MD [facility] in [place]). The patient was screened for readmission hospitalization with noted medium risk score for unplanned readmission risk with 2 IP in 6 months.  The patient was assessed for potential [facility]) Care Management service needs for post hospital transition for care coordination. Review of patient's electronic medical record reveals patient was admitted weakness (stroke related).  Pt will be discharged to SNF [facility]) as the facility will manage pt's ongoing needs.   [facility] Care Management/Population Health does not replace or interfere with any arrangements made by the Inpatient Transition of Care team.   For questions contact:   Elliot Cousin, RN, BSN [facility] Liaison [place]   [facility]  Population Health Office Hours MTWF  8:00 am-6:00 pm Off on Thursday [phone] mobile [phone] [Office toll free line][facility] Office Hours are M-F 8:30 - 5 pm 24 hour nurse advise line [phone] Concierge  [email or web address]

## 2023-04-11 NOTE — TOC Progression Note (Signed)
Transition of Care [facility]) - Progression Note    Patient Details  Name: Joseph [name] MRN: [id number] Date of Birth: 02-03-1925  Transition of Care [facility]) CM/SW Contact  Allena Katz, LCSW Phone Number: 04/11/2023, 9:25 AM  Clinical Narrative:   Berkley Harvey approved for [facility]. Auth id [id number]      Barriers to Discharge: Continued Medical Work up  Expected Discharge Plan and Services                                               Social Determinants of Health (SDOH) Interventions SDOH Screenings   Food Insecurity: No Food Insecurity (04/06/2023)  Housing: Low Risk  (04/06/2023)  Transportation Needs: No Transportation Needs (04/06/2023)  Utilities: Not At Risk (04/06/2023)  Alcohol Screen: Low Risk  (03/02/2022)  Depression (PHQ2-9): Low Risk  (01/13/2023)  Financial Resource Strain: Low Risk  (03/02/2022)  Physical Activity: Insufficiently Active (03/02/2022)  Social Connections: Moderately Integrated (03/02/2022)  Stress: No Stress Concern Present (03/02/2022)  Tobacco Use: Medium Risk (01/13/2023)    Readmission Risk Interventions    12/10/2022    3:58 PM  Readmission Risk Prevention Plan  Transportation Screening Complete  PCP or Specialist Appt within 5-7 Days Complete  Home Care Screening Complete  Medication Review (RN CM) Complete

## 2023-04-11 NOTE — TOC Transition Note (Addendum)
Transition of Care [facility]) - CM/SW Discharge Note   Patient Details  Name: Joseph [name] MRN: [id number] Date of Birth: 1925/07/28  Transition of Care [facility]) CM/SW Contact:  Allena Katz, LCSW Phone Number: 04/11/2023, 10:01 AM   Clinical Narrative:   Pt has auth to go to [facility]. Sue Lush notified. RN given number for report. DC summary sent. Safe transport to be called. Safe transport to be here at 3pm.     Final next level of care: Skilled Nursing Facility Barriers to Discharge: Barriers Resolved   Patient Goals and CMS Choice CMS Medicare.gov Compare Post Acute Care list provided to:: Patient    Discharge Placement                Patient chooses bed at: [facility] Patient to be transferred to facility by: [facility] Additional resources added to the After Visit Summary for                                       Social Determinants of Health (SDOH) Interventions SDOH Screenings   Food Insecurity: No Food Insecurity (04/06/2023)  Housing: Low Risk  (04/06/2023)  Transportation Needs: No Transportation Needs (04/06/2023)  Utilities: Not At Risk (04/06/2023)  Alcohol Screen: Low Risk  (03/02/2022)  Depression (PHQ2-9): Low Risk  (01/13/2023)  Financial Resource Strain: Low Risk  (03/02/2022)  Physical Activity: Insufficiently Active (03/02/2022)  Social Connections: Moderately Integrated (03/02/2022)  Stress: No Stress Concern Present (03/02/2022)  Tobacco Use: Medium Risk (01/13/2023)     Readmission Risk Interventions    12/10/2022    3:58 PM  Readmission Risk Prevention Plan  Transportation Screening Complete  PCP or Specialist Appt within 5-7 Days Complete  Home Care Screening Complete  Medication Review (RN CM) Complete

## 2023-04-11 NOTE — Care Management Important Message (Signed)
Important Message  Patient Details  Name: [name] MRN: [id number] Date of Birth: 03/22/1925   Medicare Important Message Given:  Yes  I reviewed the patient's Important Message from Medicare with the patient's [name], [name], daughter [phone] and family is in agreement with the discharge. I wished him well and thanked her for her time.    Olegario Messier A Rip Hawes 04/11/2023, 11:44 AM

## 2023-04-11 NOTE — Telephone Encounter (Signed)
Left voicemail to schedule appointment with Dr. Lalla Brothers

## 2023-04-11 NOTE — Discharge Summary (Signed)
Physician Discharge Summary  Joseph [name] ZOX:[id number] DOB: 1925/06/21 DOA: 04/06/2023  PCP: Bosie Clos, MD  Admit date: 04/06/2023 Discharge date: 04/11/2023  Admitted From: Home Disposition:  SNF  Recommendations for Outpatient Follow-up:  Follow up with PCP in 1-2 weeks Please obtain BMP/CBC in one week your next doctors visit.  Requip BID started Please continue libre 3 for monitoring BS Insulin regimen must be given as prescribed. Adjust if needed.    Discharge Condition: Stable CODE STATUS: Full  Diet recommendation: Diabetic.   Brief/Interim Summary: SABDIEL KERKSTRA is a 89 y.o. male with medical history significant of stroke with right-sided weakness 11/2022, HTN, HLD, DM, CAD, CABG, dCHF, hypothyroidism, CKD-3A, who presents with generalized weakness and mild left facial droop.  Patient diagnosed with acute CVA on MRI.  Echocardiogram also showed EF of 45% and new onset atrial fibrillation therefore cardiology team consulted.  Neurology recommending DAPT but patient was started on Eliquis due to A-fib. PT/OT recommended SNF, arrangements made.    Assessment & Plan:  Principal Problem:   Stroke [facility]) Active Problems:   New onset atrial fibrillation (HCC)   Essential hypertension   Coronary artery disease   Type II diabetes mellitus with renal manifestations (HCC)   Chronic kidney disease, stage 3a (HCC)   Mixed hyperlipidemia   Hypothyroidism   COPD (chronic obstructive pulmonary disease) (HCC)   Chronic diastolic CHF (congestive heart failure) (HCC)   Obesity (BMI 30-39.9)   Weakness   Acute combined systolic and diastolic heart failure (HCC)        Acute ischemic stroke [facility]):  Patient found to have acute CVA on MRI.  Also had new onset atrial fibrillation.  CTA did not show any large vessel occlusion.  A1c 6.9.  Neurology recommending aspirin and Plavix for total of 3 months followed by aspirin alone but stopped as patient is now on Eliquis for  A-fib.  PT/OT has recommended SNF.   AKI on chronic kidney disease, stage 3a [facility]): Renal function is improved  Baseline creatinine 1.1.  Trended up to 1.64 now back to baseline. Resume home meds. Repeat Lab work in 1 week.      New onset atrial fibrillation [facility]): CHA2DS2-VASc Score is 7,  Currently rate is under control.  Eliquis twice daily   Hypokalemia-continue repletion and monitoring   Hyperlipidemia, mixed -Continue fenofibrate   Essential hypertension Slowly resume antihypertensive meds.  We can start normalizing blood pressure.  Will defer adding cardiac medicines   Coronary artery disease: s/p of CABG. No chest pain -Continue ASA Not on beta-blockers on account of bradycardia   Type II diabetes mellitus with renal manifestations [facility]):: Recent A1c 7.4, poorly controlled.   Resume home regimen. Patient is allergic to [place] -At the facility would request patient to continue his libre monitor and continue his home diabetic regimen     Hypothyroidism -Continue levothyroxine   COPD (chronic obstructive pulmonary disease) (HCC): Stable -As needed bronchodilators   Chronic diastolic CHF (congestive heart failure) (HCC):  Echocardiogram shows EF of 45%.   Cardiac med adjustment per cardiology team   Obesity (BMI 30-39.9): Body weight 94.3 kg, BMI 32.58 Supportive care   Restless leg syndrome - Started on ropinirole      Discharge Diagnoses:  Principal Problem:   Stroke [facility]) Active Problems:   New onset atrial fibrillation (HCC)   Essential hypertension   Coronary artery disease   Type II diabetes mellitus with renal manifestations (HCC)   Chronic kidney disease, stage 3a (  HCC)   Mixed hyperlipidemia   Hypothyroidism   COPD (chronic obstructive pulmonary disease) (HCC)   Chronic diastolic CHF (congestive heart failure) (HCC)   Obesity (BMI 30-39.9)   Weakness   Acute combined systolic and diastolic heart failure  [facility])      Consultations: Cardiology Neurology  Subjective: Doing ok no complaints.  Family at bedside.   Discharge Exam: Vitals:   04/11/23 0452 04/11/23 0815  BP: (!) 156/71 (!) 154/68  Pulse: (!) 106 98  Resp: 18 18  Temp: 98.5 F (36.9 C) 98.3 F (36.8 C)  SpO2: 97% 97%   Vitals:   04/10/23 1700 04/10/23 2119 04/11/23 0452 04/11/23 0815  BP:  (!) 145/54 (!) 156/71 (!) 154/68  Pulse: 98 (!) 102 (!) 106 98  Resp:  18 18 18   Temp:  98.2 F (36.8 C) 98.5 F (36.9 C) 98.3 F (36.8 C)  TempSrc:  Oral Oral   SpO2:  97% 97% 97%  Weight:      Height:        General: Pt is alert, awake, not in acute distress Cardiovascular: RRR, S1/S2 +, no rubs, no gallops Respiratory: CTA bilaterally, no wheezing, no rhonchi Abdominal: Soft, NT, ND, bowel sounds + Extremities: no edema, no cyanosis  Discharge Instructions   Allergies as of 04/11/2023       Reactions   Farxiga [dapagliflozin] Rash   Genital rash        Medication List     STOP taking these medications    clopidogrel 75 MG tablet Commonly known as: PLAVIX   losartan 100 MG tablet Commonly known as: COZAAR   Magnesium 250 MG Tabs   nitroGLYCERIN 0.4 MG SL tablet Commonly known as: Nitrostat   potassium chloride 10 MEQ tablet Commonly known as: Klor-Con M10   torsemide 20 MG tablet Commonly known as: DEMADEX       TAKE these medications    apixaban 5 MG Tabs tablet Commonly known as: ELIQUIS Take 1 tablet (5 mg total) by mouth 2 (two) times daily.   atorvastatin 80 MG tablet Commonly known as: LIPITOR Take 1 tablet (80 mg total) by mouth daily. What changed: when to take this   Dermacloud Oint Apply 1 application  topically daily as needed. Apply to peri area topically every shift for skin protectant   fenofibrate 54 MG tablet Take 1 tablet (54 mg total) by mouth daily.   hydrALAZINE 50 MG tablet Commonly known as: APRESOLINE Take 1 tablet (50 mg total) by mouth every 8  (eight) hours. What changed: when to take this   insulin aspart 100 UNIT/ML injection Commonly known as: novoLOG Inject 20-25 Units into the skin 3 (three) times daily with meals. 25 units in the morning 20 units at noon 25 units in the evening   insulin glargine 100 UNIT/ML injection Commonly known as: LANTUS Inject 40 Units into the skin daily.   levothyroxine 50 MCG tablet Commonly known as: SYNTHROID TAKE 1 TABLET BY MOUTH ONCE DAILY BEFORE BREAKFAST   omeprazole 20 MG capsule Commonly known as: PRILOSEC Take 1 capsule by mouth once daily   oxymetazoline 0.05 % nasal spray Commonly known as: AFRIN Place 2 sprays into both nostrils once for nose bleed, seek medical attention if bleeding persists after 2 sprays, do not repeat dose   rOPINIRole 0.5 MG tablet Commonly known as: REQUIP Take 1 tablet (0.5 mg total) by mouth 2 (two) times daily.        Follow-up Information  Bosie Clos, MD Follow up in 1 week(s).   Specialty: Family Medicine Contact information: [address] [place] [address] [phone]         Bosie Clos, MD Follow up.   Specialty: Family Medicine Contact information: [address] [place] [address] [phone]                Allergies  Allergen Reactions   Marcelline Deist [Dapagliflozin] Rash    Genital rash    You were cared for by a hospitalist during your hospital stay. If you have any questions about your discharge medications or the care you received while you were in the hospital after you are discharged, you can call the unit and asked to speak with the hospitalist on call if the hospitalist that took care of you is not available. Once you are discharged, your primary care physician will handle any further medical issues. Please note that no refills for any discharge medications will be authorized once you are discharged, as it is imperative that you return to your primary care physician (or  establish a relationship with a primary care physician if you do not have one) for your aftercare needs so that they can reassess your need for medications and monitor your lab values.  You were cared for by a hospitalist during your hospital stay. If you have any questions about your discharge medications or the care you received while you were in the hospital after you are discharged, you can call the unit and asked to speak with the hospitalist on call if the hospitalist that took care of you is not available. Once you are discharged, your primary care physician will handle any further medical issues. Please note that NO REFILLS for any discharge medications will be authorized once you are discharged, as it is imperative that you return to your primary care physician (or establish a relationship with a primary care physician if you do not have one) for your aftercare needs so that they can reassess your need for medications and monitor your lab values.  Please request your Prim.MD to go over all Hospital Tests and Procedure/Radiological results at the follow up, please get all Hospital records sent to your Prim MD by signing hospital release before you go home.  Get CBC, CMP, 2 view Chest X ray checked  by Primary MD during your next visit or SNF MD in 5-7 days ( we routinely change or add medications that can affect your baseline labs and fluid status, therefore we recommend that you get the mentioned basic workup next visit with your PCP, your PCP may decide not to get them or add new tests based on their clinical decision)  On your next visit with your primary care physician please Get Medicines reviewed and adjusted.  If you experience worsening of your admission symptoms, develop shortness of breath, life threatening emergency, suicidal or homicidal thoughts you must seek medical attention immediately by calling 911 or calling your MD immediately  if symptoms less severe.  You Must read complete  instructions/literature along with all the possible adverse reactions/side effects for all the Medicines you take and that have been prescribed to you. Take any new Medicines after you have completely understood and accpet all the possible adverse reactions/side effects.   Do not drive, operate heavy machinery, perform activities at heights, swimming or participation in water activities or provide baby sitting services if your were admitted for syncope or siezures until you have seen  by Primary MD or a Neurologist and advised to do so again.  Do not drive when taking Pain medications.   Procedures/Studies: ECHOCARDIOGRAM COMPLETE  Result Date: 04/07/2023    ECHOCARDIOGRAM REPORT   Patient Name:   BILLEY TOEWS Date of Exam: 04/07/2023 Medical Rec #:  [id number]         Height:       67.0 in Accession #:    [id number]        Weight:       208.0 lb Date of Birth:  [birth year]        BSA:          2.056 m Patient Age:    89 years          BP:           151/55 mmHg Patient Gender: M                 HR:           54 bpm. Exam Location:  [facility] Procedure: 2D Echo, Cardiac Doppler, Color Doppler and Intracardiac            Opacification Agent Indications:     Sroke  History:         Patient has prior history of Echocardiogram examinations, most                  recent 12/11/2022. CHF, CAD and Previous Myocardial Infarction,                  Prior CABG, Stroke and COPD, Arrythmias:Atrial Fibrillation and                  Bradycardia, Signs/Symptoms:Edema, Dyspnea, Fatigue and                  Shortness of Breath; Risk Factors:Hypertension, Diabetes and                  Dyslipidemia.  Sonographer:     Mikki Harbor Referring Phys:  [id number] Lorretta Harp Diagnosing Phys: Julien Nordmann MD  Sonographer Comments: Image acquisition challenging due to COPD. IMPRESSIONS  1. Left ventricular ejection fraction, by estimation, is 45 to 50%. The left ventricle has mildly decreased function. The left ventricle has no regional wall  motion abnormalities. Left ventricular diastolic parameters are indeterminate.  2. Right ventricular systolic function is normal. The right ventricular size is normal. There is normal pulmonary artery systolic pressure.  3. Left atrial size was moderately dilated.  4. The mitral valve is normal in structure. Mild to moderate mitral valve regurgitation. No evidence of mitral stenosis.  5. The aortic valve is normal in structure. Aortic valve regurgitation is not visualized. Aortic valve sclerosis/calcification is present, without any evidence of aortic stenosis. Aortic valve mean gradient measures 8.0 mmHg.  6. There is borderline dilatation of the ascending aorta, measuring 38 mm.  7. The inferior vena cava is normal in size with greater than 50% respiratory variability, suggesting right atrial pressure of 3 mmHg. FINDINGS  Left Ventricle: Left ventricular ejection fraction, by estimation, is 45 to 50%. The left ventricle has mildly decreased function. The left ventricle has no regional wall motion abnormalities. Definity contrast agent was given IV to delineate the left ventricular endocardial borders. The left ventricular internal cavity size was normal in size. There is no left ventricular hypertrophy. Left ventricular diastolic parameters are indeterminate. Right Ventricle: The right ventricular size is normal. No increase in right ventricular  wall thickness. Right ventricular systolic function is normal. There is normal pulmonary artery systolic pressure. The tricuspid regurgitant velocity is 1.84 m/s, and  with an assumed right atrial pressure of 3 mmHg, the estimated right ventricular systolic pressure is 16.5 mmHg. Left Atrium: Left atrial size was moderately dilated. Right Atrium: Right atrial size was normal in size. Pericardium: There is no evidence of pericardial effusion. Mitral Valve: The mitral valve is normal in structure. Mild to moderate mitral valve regurgitation. No evidence of mitral valve  stenosis. MV peak gradient, 3.7 mmHg. The mean mitral valve gradient is 1.0 mmHg. Tricuspid Valve: The tricuspid valve is normal in structure. Tricuspid valve regurgitation is not demonstrated. No evidence of tricuspid stenosis. Aortic Valve: The aortic valve is normal in structure. Aortic valve regurgitation is not visualized. Aortic valve sclerosis/calcification is present, without any evidence of aortic stenosis. Aortic valve mean gradient measures 8.0 mmHg. Aortic valve peak  gradient measures 16.3 mmHg. Aortic valve area, by VTI measures 1.61 cm. Pulmonic Valve: The pulmonic valve was normal in structure. Pulmonic valve regurgitation is not visualized. No evidence of pulmonic stenosis. Aorta: The aortic root is normal in size and structure. There is borderline dilatation of the ascending aorta, measuring 38 mm. Venous: The inferior vena cava is normal in size with greater than 50% respiratory variability, suggesting right atrial pressure of 3 mmHg. IAS/Shunts: No atrial level shunt detected by color flow Doppler.  LEFT VENTRICLE PLAX 2D LVIDd:         5.30 cm   Diastology LVIDs:         3.40 cm   LV e' medial:    6.20 cm/s LV PW:         1.40 cm   LV E/e' medial:  12.1 LV IVS:        1.30 cm   LV e' lateral:   9.46 cm/s LVOT diam:     2.10 cm   LV E/e' lateral: 7.9 LV SV:         79 LV SV Index:   39 LVOT Area:     3.46 cm  RIGHT VENTRICLE RV Basal diam:  3.60 cm RV Mid diam:    3.30 cm RV S prime:     7.83 cm/s TAPSE (M-mode): 1.8 cm LEFT ATRIUM             Index        RIGHT ATRIUM           Index LA diam:        5.40 cm 2.63 cm/m   RA Area:     17.00 cm LA Vol (A2C):   72.3 ml 35.16 ml/m  RA Volume:   40.10 ml  19.50 ml/m LA Vol (A4C):   52.6 ml 25.58 ml/m LA Biplane Vol: 62.8 ml 30.54 ml/m  AORTIC VALVE                     PULMONIC VALVE AV Area (Vmax):    1.78 cm      PV Vmax:       1.18 m/s AV Area (Vmean):   1.62 cm      PV Peak grad:  5.6 mmHg AV Area (VTI):     1.61 cm AV Vmax:            202.00 cm/s AV Vmean:          133.000 cm/s AV VTI:  0.493 m AV Peak Grad:      16.3 mmHg AV Mean Grad:      8.0 mmHg LVOT Vmax:         104.00 cm/s LVOT Vmean:        62.100 cm/s LVOT VTI:          0.229 m LVOT/AV VTI ratio: 0.46  AORTA Ao Root diam: 4.30 cm Ao Asc diam:  3.80 cm MITRAL VALVE               TRICUSPID VALVE MV Area (PHT): 2.83 cm    TR Peak grad:   13.5 mmHg MV Area VTI:   2.48 cm    TR Vmax:        184.00 cm/s MV Peak grad:  3.7 mmHg MV Mean grad:  1.0 mmHg    SHUNTS MV Vmax:       0.96 m/s    Systemic VTI:  0.23 m MV Vmean:      50.6 cm/s   Systemic Diam: 2.10 cm MV Decel Time: 268 msec MV E velocity: 74.90 cm/s MV A velocity: 95.90 cm/s MV E/A ratio:  0.78 Julien Nordmann MD Electronically signed by Julien Nordmann MD Signature Date/Time: 04/07/2023/6:01:01 PM    Final    CT HEAD WO CONTRAST ( )  Result Date: 04/07/2023 CLINICAL DATA:  Stroke, follow up EXAM: CT HEAD WITHOUT CONTRAST TECHNIQUE: Contiguous axial images were obtained from the base of the skull through the vertex without intravenous contrast. RADIATION DOSE REDUCTION: This exam was performed according to the departmental dose-optimization program which includes automated exposure control, adjustment of the mA and/or kV according to patient size and/or use of iterative reconstruction technique. COMPARISON:  MR Brain 04/06/23, CT head 04/06/23 FINDINGS: Brain: No hemorrhage. No hydrocephalus. There are prominent subarachnoid spaces bilaterally, unchanged. There is sequela of moderate chronic microvascular ischemic change with a chronic infarct in the left corona radiata and an acute infarct in the right basal ganglia, better characterized on recent brain MRI. Vascular: No hyperdense vessel or unexpected calcification. Skull: Normal. Negative for fracture or focal lesion. Sinuses/Orbits: No mastoid or middle ear effusion. Paranasal sinuses are clear. Bilateral lens replacement. Orbits are otherwise unremarkable. Other:  None. IMPRESSION: 1. No acute intracranial hemorrhage. 2. Acute infarct in the right basal ganglia, better characterized on recent brain MRI. Electronically Signed   By: Lorenza Cambridge M.D.   On: 04/07/2023 16:08   CT ANGIO HEAD NECK W WO CM  Result Date: 04/06/2023 CLINICAL DATA:  Initial evaluation for neuro deficit, stroke. EXAM: CT ANGIOGRAPHY HEAD AND NECK WITH AND WITHOUT CONTRAST TECHNIQUE: Multidetector CT imaging of the head and neck was performed using the standard protocol during bolus administration of intravenous contrast. Multiplanar CT image reconstructions and MIPs were obtained to evaluate the vascular anatomy. Carotid stenosis measurements (when applicable) are obtained utilizing NASCET criteria, using the distal internal carotid diameter as the denominator. RADIATION DOSE REDUCTION: This exam was performed according to the departmental dose-optimization program which includes automated exposure control, adjustment of the mA and/or kV according to patient size and/or use of iterative reconstruction technique. CONTRAST:  75mL OMNIPAQUE IOHEXOL 350 MG/ML SOLN COMPARISON:  MRI and CT from earlier the same day. FINDINGS: CTA NECK FINDINGS Aortic arch: Visualized aortic arch normal caliber with standard branch pattern. Moderate atheromatous change about the arch itself. No high-grade stenosis about the origin the great vessels. Right carotid system: Right common and internal carotid arteries are patent without dissection. Mild to moderate atheromatous change  about the right carotid bulb without hemodynamically significant greater 50% stenosis. Left carotid system: Left common and internal carotid arteries are patent without dissection. Moderate atheromatous change about the left carotid bulb without hemodynamically significant greater than 50% stenosis. Vertebral arteries: Both vertebral arteries arise from subclavian arteries. No significant proximal subclavian artery stenosis. Atheromatous plaque  at the origin of the right vertebral artery with associated severe stenosis (series 6, image 264). Vertebral arteries otherwise patent without significant stenosis or dissection. Skeleton: No discrete or worrisome osseous lesions. C6 and C7 vertebral bodies are ankylosed. Underlying moderate cervical spondylosis. Prior median sternotomy. Other neck: No other acute abnormality within the neck. Upper chest: Visualized upper chest demonstrates no other acute finding. Review of the MIP images confirms the above findings CTA HEAD FINDINGS Anterior circulation: Atheromatous change about the carotid siphons with associated mild diffuse narrowing on the right with more moderate stenosis on the left. A1 segments patent bilaterally. No aneurysm. Left A1 hypoplastic, accounting for the mildly diminutive left ICA is compared to the right. Normal anterior communicating complex. Anterior cerebral arteries patent without stenosis. No M1 stenosis or occlusion. No proximal MCA branch occlusion or high-grade stenosis. Distal MCA branches grossly perfused and symmetric. Diffuse small vessel atheromatous irregularity noted. Posterior circulation: Atheromatous change about the proximal V4 segments bilaterally without hemodynamically significant stenosis. Both PICA grossly patent at their origins. Basilar patent without stenosis. Superior cerebral arteries patent bilaterally. Both PCAs primarily supplied via the basilar. Atheromatous change about the PCAs bilaterally. No significant stenosis on the left. Moderate proximal right P2 stenoses noted (series 9, image 21). Right PCA remains patent to its distal aspect. Venous sinuses: Not well assessed due to arterial timing the contrast bolus. Anatomic variants: As above.  No aneurysm. Review of the MIP images confirms the above findings IMPRESSION: 1. Negative CTA for large vessel occlusion or other emergent finding. 2. Moderate atheromatous change about the carotid siphons with associated  mild to moderate stenosis, left worse than right. 3. Additional intracranial atheromatous disease with associated moderate proximal right P2 stenoses. Diffuse small vessel atheromatous irregularity. 4. Atheromatous plaque at the origin of the right vertebral artery with associated severe stenosis. Electronically Signed   By: Rise Mu M.D.   On: 04/06/2023 19:27   MR BRAIN WO CONTRAST  Result Date: 04/06/2023 CLINICAL DATA:  Generalized weakness, stroke suspected EXAM: MRI HEAD WITHOUT CONTRAST TECHNIQUE: Multiplanar, multiecho pulse sequences of the brain and surrounding structures were obtained without intravenous contrast. COMPARISON:  12/09/2022 MRI head, correlation is also made with same day CT head FINDINGS: Brain: Restricted diffusion with ADC correlate in the right posterior limb of the internal capsule and corona radiata (series 8, images 26), which measures up to 15 x 6 x 9 mm (AP x TR x CC) this is associated with increased T2 hyperintense signal. No acute hemorrhage, mass, mass effect, or midline shift. No hydrocephalus or extra-axial collection. Normal craniocervical junction. No hemosiderin deposition to suggest remote hemorrhage. Sequela of prior infarct in the left posterior limb of the internal capsule and corona radiata. Vascular: Normal arterial flow voids. Skull and upper cervical spine: Normal marrow signal. Sinuses/Orbits: Clear paranasal sinuses. No acute finding in the orbits. Status post bilateral lens replacements. Other: Trace fluid in right mastoid air cells. IMPRESSION: Acute infarct in the right posterior limb of the internal capsule and corona radiata. These results were called by telephone at the time of interpretation on 04/06/2023 at 4:39 pm to provider [facility] , who verbally acknowledged these results.  Electronically Signed   By: Wiliam Ke M.D.   On: 04/06/2023 16:42   CT HEAD WO CONTRAST ( )  Result Date: 04/06/2023 CLINICAL DATA:  Neuro deficit, acute,  stroke suspected. Additional history provided: Weakness, prior stroke. EXAM: CT HEAD WITHOUT CONTRAST TECHNIQUE: Contiguous axial images were obtained from the base of the skull through the vertex without intravenous contrast. RADIATION DOSE REDUCTION: This exam was performed according to the departmental dose-optimization program which includes automated exposure control, adjustment of the mA and/or kV according to patient size and/or use of iterative reconstruction technique. COMPARISON:  Non-contrast head CT 12/10/2022. Brain MRI 12/09/2022. FINDINGS: Brain: Known chronic infarct within the left corona radiata/posterior limb of left internal capsule. There is no acute intracranial hemorrhage. No demarcated cortical infarct. No extra-axial fluid collection. No evidence of an intracranial mass. No midline shift. Vascular: No hyperdense vessel. Atherosclerotic calcifications. Skull: No fracture or aggressive osseous lesion. Sinuses/Orbits: No mass or acute finding within the imaged orbits. No significant paranasal sinus disease at the imaged levels. IMPRESSION: 1.  No evidence of an acute intracranial abnormality. 2. 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 Electronically Signed   By: Jackey Loge D.O.   On: 04/06/2023 13:09     The results of significant diagnostics from this hospitalization (including imaging, microbiology, ancillary and laboratory) are listed below for reference.     Microbiology: No results found for this or any previous visit (from the past 240 hour(s)).   Labs: BNP (last 3 results) Recent Labs    04/06/23 1206  BNP 97.9   Basic Metabolic Panel: Recent Labs  Lab 04/07/23 0953 04/08/23 0408 04/09/23 0423 04/10/23 0418 04/10/23 1452 04/11/23 0528  NA 139 140 137 139  --  140  K 3.6 3.4* 3.6 3.9  --  3.8  CL 110 111 109 111  --  111  CO2 23 23 21* 20*  --  21*  GLUCOSE 168* 126* 157* 159*  --  189*  BUN 16 16 16 23    --  20  CREATININE 1.09 1.17 1.17 1.64* 1.45* 1.29*  CALCIUM 8.7* 8.6* 8.6* 8.9  --  9.2  MG  --   --   --  2.0  --  1.9   Liver Function Tests: Recent Labs  Lab 04/06/23 1206  AST 30  ALT 15  ALKPHOS 39  BILITOT 1.0  PROT 6.3*  ALBUMIN 3.3*   No results for input(s): "LIPASE", "AMYLASE" in the last 168 hours. No results for input(s): "AMMONIA" in the last 168 hours. CBC: Recent Labs  Lab 04/06/23 1206 04/07/23 0953 04/08/23 0408 04/09/23 0423 04/10/23 0418  WBC 6.6 4.8 5.5 6.9 7.3  NEUTROABS  --  2.0 2.1 3.3 3.0  HGB 12.9* 12.8* 11.9* 12.5* 12.4*  HCT 38.7* 38.4* 35.5* 37.9* 37.0*  MCV 90.8 89.3 89.6 91.3 90.9  PLT 256 237 232 227 240   Cardiac Enzymes: No results for input(s): "CKTOTAL", "CKMB", "CKMBINDEX", "TROPONINI" in the last 168 hours. BNP: Invalid input(s): "POCBNP" CBG: Recent Labs  Lab 04/10/23 0843 04/10/23 1136 04/10/23 1657 04/10/23 2120 04/11/23 0818  GLUCAP 145* 233* 232* 198* 169*   D-Dimer No results for input(s): "DDIMER" in the last 72 hours. Hgb A1c No results for input(s): "HGBA1C" in the last 72 hours. Lipid Profile No results for input(s): "CHOL", "HDL", "LDLCALC", "TRIG", "CHOLHDL", "LDLDIRECT" in the last 72 hours. Thyroid function studies No results for input(s): "TSH", "T4TOTAL", "T3FREE", "THYROIDAB" in the last 72  hours.  Invalid input(s): "FREET3" Anemia work up No results for input(s): "VITAMINB12", "FOLATE", "FERRITIN", "TIBC", "IRON", "RETICCTPCT" in the last 72 hours. Urinalysis    Component Value Date/Time   COLORURINE YELLOW (A) 04/06/2023 1206   APPEARANCEUR CLEAR (A) 04/06/2023 1206   LABSPEC 1.018 04/06/2023 1206   PHURINE 5.0 04/06/2023 1206   GLUCOSEU >=500 (A) 04/06/2023 1206   HGBUR NEGATIVE 04/06/2023 1206   BILIRUBINUR NEGATIVE 04/06/2023 1206   BILIRUBINUR negative 02/10/2020 1600   KETONESUR NEGATIVE 04/06/2023 1206   PROTEINUR NEGATIVE 04/06/2023 1206   UROBILINOGEN 0.2 02/10/2020 1600   NITRITE  NEGATIVE 04/06/2023 1206   LEUKOCYTESUR NEGATIVE 04/06/2023 1206   Sepsis Labs Recent Labs  Lab 04/07/23 0953 04/08/23 0408 04/09/23 0423 04/10/23 0418  WBC 4.8 5.5 6.9 7.3   Microbiology No results found for this or any previous visit (from the past 240 hour(s)).   Time coordinating discharge:  I have spent 35 minutes face to face with the patient and on the ward discussing the patients care, assessment, plan and disposition with other care givers. >50% of the time was devoted counseling the patient about the risks and benefits of treatment/Discharge disposition and coordinating care.   SIGNED:   Dimple Nanas, MD  [facility] 04/11/2023, 10:47 AM   If 7PM-7AM, please contact night-coverage

## 2023-04-11 NOTE — Progress Notes (Signed)
Patient discharging to [facility]. Now will be going by private transport. Has requested to eat lunch prior to leaving.

## 2023-04-12 ENCOUNTER — Non-Acute Institutional Stay (SKILLED_NURSING_FACILITY): Payer: PPO | Admitting: Student

## 2023-04-12 ENCOUNTER — Encounter: Payer: Self-pay | Admitting: Student

## 2023-04-12 DIAGNOSIS — J449 Chronic obstructive pulmonary disease, unspecified: Secondary | ICD-10-CM | POA: Diagnosis not present

## 2023-04-12 DIAGNOSIS — I4891 Unspecified atrial fibrillation: Secondary | ICD-10-CM | POA: Diagnosis not present

## 2023-04-12 DIAGNOSIS — E876 Hypokalemia: Secondary | ICD-10-CM | POA: Diagnosis not present

## 2023-04-12 DIAGNOSIS — I10 Essential (primary) hypertension: Secondary | ICD-10-CM

## 2023-04-12 DIAGNOSIS — G2581 Restless legs syndrome: Secondary | ICD-10-CM

## 2023-04-12 DIAGNOSIS — R040 Epistaxis: Secondary | ICD-10-CM | POA: Diagnosis not present

## 2023-04-12 DIAGNOSIS — E039 Hypothyroidism, unspecified: Secondary | ICD-10-CM | POA: Diagnosis not present

## 2023-04-12 DIAGNOSIS — N179 Acute kidney failure, unspecified: Secondary | ICD-10-CM

## 2023-04-12 DIAGNOSIS — I25708 Atherosclerosis of coronary artery bypass graft(s), unspecified, with other forms of angina pectoris: Secondary | ICD-10-CM

## 2023-04-12 DIAGNOSIS — Z8673 Personal history of transient ischemic attack (TIA), and cerebral infarction without residual deficits: Secondary | ICD-10-CM | POA: Diagnosis not present

## 2023-04-12 DIAGNOSIS — E1151 Type 2 diabetes mellitus with diabetic peripheral angiopathy without gangrene: Secondary | ICD-10-CM | POA: Diagnosis not present

## 2023-04-12 DIAGNOSIS — K219 Gastro-esophageal reflux disease without esophagitis: Secondary | ICD-10-CM

## 2023-04-12 DIAGNOSIS — I69351 Hemiplegia and hemiparesis following cerebral infarction affecting right dominant side: Secondary | ICD-10-CM

## 2023-04-12 DIAGNOSIS — Z794 Long term (current) use of insulin: Secondary | ICD-10-CM

## 2023-04-12 DIAGNOSIS — I5042 Chronic combined systolic (congestive) and diastolic (congestive) heart failure: Secondary | ICD-10-CM

## 2023-04-12 NOTE — Progress Notes (Addendum)
Provider:  Dr. Earnestine Mealing Location:  Other [place].  Nursing Home Room Number: [facility] 107A Place of Service:  SNF (31)  PCP: Bosie Clos, MD Patient Care Team: Bosie Clos, MD as PCP - General (Family Medicine) Antonieta Iba, MD as PCP - Cardiology (Cardiology) Tedd Sias Marlana Salvage, MD as Physician Assistant (Endocrinology) Gertha Calkin, MD as Referring Physician (Internal Medicine)  Extended Emergency Contact Information Primary Emergency Contact: [facility] Phone: [phone] Relation: Daughter Secondary Emergency Contact: [facility] Phone: [phone] Relation: Son  Code Status: Full Code Goals of Care: Advanced Directive information    04/12/2023    8:27 AM  Advanced Directives  Does Patient Have a Medical Advance Directive? Yes  Type of Advance Directive Healthcare Power of Attorney  Does patient want to make changes to medical advance directive? No - Patient declined  Copy of Healthcare Power of Attorney in Chart? Yes - validated most recent copy scanned in chart (See row information)      Chief Complaint  Patient presents with   New Admit To SNF    Admission.     HPI: Patient is a 89 y.o. male seen today for admission to [facility] aft er admission for a stroke.   He went to the hospital with tiredness and blurry vision, and 1 week ago today he was very tired and wasn't walking very well. He was unsteady and didn't feel good. His caregiver called his daughter and they watched him. He has two ladies that come to care for him at night. Thursday morning Elllen went back and he wasn't much better. EMS came and went to the ED. The left side of his face was drooping and his right hand was shaking uncontrollably. The hospital stay was stressful. His daughter or caregiver was there all the time.   He has caregiver from 12:30 to 5:30 by himself. HE ususally has 24 hour care. He has two people overnight. They help with  breakfast lunch and dinner. Was in PT/OT from the [place]. He watches TV a lot and plays on his computer a lot. He has a new ramp in his home.   He doesn't feel symptoms from atrial fibrillation. He is now on Eliquis but he didn't have it before because of nose bleeds. They are hoping to have a Watchman. Follwoed by cardiology.   Past Medical History:  Diagnosis Date   Acute respiratory failure with hypoxia (HCC) 03/21/2016   CAD (coronary artery disease)    a. s/p CABG;  b. 07/2012 Cath: 3VD including 80 dLCX (med Rx), 4/4 patent grafts.   Chronic diastolic CHF (congestive heart failure) (HCC)    a. 07/2012 Echo: EF 55-60%, no rwma, Gr 1 DD, mild MR;  04/2014 Echo: EF 55-60%, no rwma, mild MR, mildly dil LA.   Diabetes mellitus, type 2 (HCC)    HTN (hypertension)    Hyperlipidemia    Hypothyroidism    Lower extremity edema    a. 03/2014 amlodipine d/c'd.   Spinal stenosis    Past Surgical History:  Procedure Laterality Date   APPENDECTOMY     CARDIAC CATHETERIZATION  07/2012   [facility]/no stents   CHOLECYSTECTOMY     CORNEAL TRANSPLANT     CORONARY ARTERY BYPASS GRAFT  1994   HEMORRHOID SURGERY     LEFT HEART CATH AND CORS/GRAFTS ANGIOGRAPHY N/A 12/17/2019   Procedure: LEFT HEART CATH AND CORS/GRAFTS ANGIOGRAPHY;  Surgeon: Yvonne Kendall, MD;  Location: [facility] INVASIVE CV LAB;  Service:  Cardiovascular;  Laterality: N/A;   PROSTATE BIOPSY     TOE SURGERY     ingrown toe nail    reports that he quit smoking about 42 years ago. His smoking use included pipe, cigars, and cigarettes. He has never used smokeless tobacco. He reports that he does not drink alcohol and does not use drugs. Social History   Socioeconomic History   Marital status: Widowed    Spouse name: Not on file   Number of children: 3   Years of education: Not on file   Highest education level: Some college, no degree  Occupational History   Occupation: retired  Tobacco Use   Smoking status: Former    Packs/day: 0.00     Years: 6.00    Additional pack years: 0.00    Total pack years: 0.00    Types: Pipe, Cigars, Cigarettes    Quit date: 12/23/1980    Years since quitting: 42.3   Smokeless tobacco: Never  Vaping Use   Vaping Use: Never used  Substance and Sexual Activity   Alcohol use: No   Drug use: No   Sexual activity: Never  Other Topics Concern   Not on file  Social History Narrative   Retired.    Social Determinants of Health   Financial Resource Strain: Low Risk  (03/02/2022)   Overall Financial Resource Strain (CARDIA)    Difficulty of Paying Living Expenses: Not hard at all  Food Insecurity: No Food Insecurity (04/06/2023)   Hunger Vital Sign    Worried About Running Out of Food in the Last Year: Never true    Ran Out of Food in the Last Year: Never true  Transportation Needs: No Transportation Needs (04/06/2023)   PRAPARE - Administrator, Civil Service (Medical): No    Lack of Transportation (Non-Medical): No  Physical Activity: Insufficiently Active (03/02/2022)   Exercise Vital Sign    Days of Exercise per Week: 2 days    Minutes of Exercise per Session: 20 min  Stress: No Stress Concern Present (03/02/2022)   [employer] of Occupational Health - Occupational Stress Questionnaire    Feeling of Stress : Not at all  Social Connections: Moderately Integrated (03/02/2022)   Social Connection and Isolation Panel [NHANES]    Frequency of Communication with Friends and Family: More than three times a week    Frequency of Social Gatherings with Friends and Family: Once a week    Attends Religious Services: More than 4 times per year    Active Member of [employer] or Organizations: No    Attends Engineer, structural: More than 4 times per year    Marital Status: Widowed  Intimate Partner Violence: Not At Risk (04/06/2023)   Humiliation, Afraid, Rape, and Kick questionnaire    Fear of Current or Ex-Partner: No    Emotionally Abused: No    Physically Abused: No     Sexually Abused: No    Functional Status Survey:    Family History  Problem Relation Age of Onset   Heart failure Mother    Heart attack Father    Stroke Brother    Heart failure Maternal Uncle    Heart failure Maternal Grandfather    Sleep apnea Daughter    Sleep apnea Son    Kidney Stones Son     Health Maintenance  Topic Date Due   Zoster Vaccines- Shingrix (1 of 2) Never done   OPHTHALMOLOGY EXAM  11/17/2020   FOOT EXAM  09/22/2021   COVID-19 Vaccine (4 - 2023-24 season) 07/22/2022   Medicare Annual Wellness (AWV)  03/03/2023   INFLUENZA VACCINE  06/22/2023   HEMOGLOBIN A1C  10/08/2023   DTaP/Tdap/Td (2 - Td or Tdap) 08/04/2024   Pneumonia Vaccine 70+ Years old  Completed   HPV VACCINES  Aged Out    Allergies  Allergen Reactions   Farxiga [Dapagliflozin] Rash    Genital rash    Outpatient Encounter Medications as of 04/12/2023  Medication Sig   apixaban (ELIQUIS) 5 MG TABS tablet Take 1 tablet (5 mg total) by mouth 2 (two) times daily.   atorvastatin (LIPITOR) 80 MG tablet Take 1 tablet (80 mg total) by mouth daily.   fenofibrate 54 MG tablet Take 1 tablet (54 mg total) by mouth daily.   hydrALAZINE (APRESOLINE) 50 MG tablet Take 50 mg by mouth daily.   Infant Care Products [facility]) OINT Apply 1 application  topically daily as needed. Apply to peri area topically every shift for skin protectant   insulin aspart (NOVOLOG) 100 UNIT/ML injection Inject 18 units once daily. Inject 20 units once daily. Inject 25 units once daily.   insulin glargine (LANTUS) 100 UNIT/ML injection Inject 40 Units into the skin daily.   levothyroxine (SYNTHROID) 50 MCG tablet TAKE 1 TABLET BY MOUTH ONCE DAILY BEFORE BREAKFAST   omeprazole (PRILOSEC) 20 MG capsule Take 1 capsule by mouth once daily   oxymetazoline (AFRIN) 0.05 % nasal spray Place 2 sprays into both nostrils once for nose bleed, seek medical attention if bleeding persists after 2 sprays, do not repeat dose   rOPINIRole  (REQUIP) 0.5 MG tablet Take 1 tablet (0.5 mg total) by mouth 2 (two) times daily.   Zinc Oxide (TRIPLE PASTE) 12.8 % ointment Apply 1 Application topically as needed for irritation. Every shift.   [DISCONTINUED] hydrALAZINE (APRESOLINE) 50 MG tablet Take 1 tablet (50 mg total) by mouth every 8 (eight) hours. (Patient taking differently: Take 50 mg by mouth daily.)   No facility-administered encounter medications on file as of 04/12/2023.    Review of Systems  Vitals:   04/12/23 0818 04/12/23 0831  BP: (!) 172/63 (!) 155/73  Pulse: 79   Resp: 18   Temp: 97.7 F (36.5 C)   SpO2: 96%   Weight: 206 lb 3.2 oz (93.5 kg)   Height: 5\' 7"  (1.702 m)    Body mass index is 32.3 kg/m. Physical Exam Constitutional:      Appearance: He is obese.     Comments: Chronically ill-appearing  Cardiovascular:     Rate and Rhythm: Normal rate. Rhythm irregular.     Pulses: Normal pulses.  Pulmonary:     Effort: Pulmonary effort is normal.     Breath sounds: Normal breath sounds.  Abdominal:     General: Abdomen is flat. Bowel sounds are normal.     Palpations: Abdomen is soft.  Musculoskeletal:        General: Normal range of motion.  Skin:    General: Skin is warm.  Neurological:     Mental Status: He is alert and oriented to person, place, and time.     Labs reviewed: Basic Metabolic Panel: Recent Labs    06/01/22 1532 06/08/22 1338 12/12/22 0527 12/13/22 0523 04/09/23 0423 04/10/23 0418 04/10/23 1452 04/11/23 0528  NA 139   < > 140   < > 137 139  --  140  K 4.7   < > 3.2*   < > 3.6 3.9  --  3.8  CL 100   < > 107   < > 109 111  --  111  CO2 24   < > 25   < > 21* 20*  --  21*  GLUCOSE 191*   < > 136*   < > 157* 159*  --  189*  BUN 20   < > 15   < > 16 23  --  20  CREATININE 1.21   < > 0.96   < > 1.17 1.64* 1.45* 1.29*  CALCIUM 9.3   < > 8.5*   < > 8.6* 8.9  --  9.2  MG  --   --  1.7  --   --  2.0  --  1.9  PHOS 3.4  --   --   --   --   --   --   --    < > = values in this  interval not displayed.   Liver Function Tests: Recent Labs    06/01/22 1532 04/06/23 1206  AST  --  30  ALT  --  15  ALKPHOS  --  39  BILITOT  --  1.0  PROT  --  6.3*  ALBUMIN 3.9 3.3*   No results for input(s): "LIPASE", "AMYLASE" in the last 8760 hours. No results for input(s): "AMMONIA" in the last 8760 hours. CBC: Recent Labs    04/08/23 0408 04/09/23 0423 04/10/23 0418  WBC 5.5 6.9 7.3  NEUTROABS 2.1 3.3 3.0  HGB 11.9* 12.5* 12.4*  HCT 35.5* 37.9* 37.0*  MCV 89.6 91.3 90.9  PLT 232 227 240   Cardiac Enzymes: No results for input(s): "CKTOTAL", "CKMB", "CKMBINDEX", "TROPONINI" in the last 8760 hours. BNP: Invalid input(s): "POCBNP" Lab Results  Component Value Date   HGBA1C 6.9 (H) 04/07/2023   Lab Results  Component Value Date   TSH 1.962 04/06/2023   No results found for: "VITAMINB12" No results found for: "FOLATE" No results found for: "IRON", "TIBC", "FERRITIN"  Imaging and Procedures obtained prior to SNF admission: ECHOCARDIOGRAM COMPLETE  Result Date: 04/07/2023    ECHOCARDIOGRAM REPORT   Patient Name:   TEION CURETON Date of Exam: 04/07/2023 Medical Rec #:  [id number]         Height:       67.0 in Accession #:    [id number]        Weight:       208.0 lb Date of Birth:  1925/03/18        BSA:          2.056 m Patient Age:    89 years          BP:           151/55 mmHg Patient Gender: M                 HR:           54 bpm. Exam Location:  [facility] Procedure: 2D Echo, Cardiac Doppler, Color Doppler and Intracardiac            Opacification Agent Indications:     Sroke  History:         Patient has prior history of Echocardiogram examinations, most                  recent 12/11/2022. CHF, CAD and Previous Myocardial Infarction,                  Prior CABG, Stroke and COPD,  Arrythmias:Atrial Fibrillation and                  Bradycardia, Signs/Symptoms:Edema, Dyspnea, Fatigue and                  Shortness of Breath; Risk Factors:Hypertension, Diabetes and                   Dyslipidemia.  Sonographer:     Mikki Harbor Referring Phys:  [id number] Lorretta Harp Diagnosing Phys: Julien Nordmann MD  Sonographer Comments: Image acquisition challenging due to COPD. IMPRESSIONS  1. Left ventricular ejection fraction, by estimation, is 45 to 50%. The left ventricle has mildly decreased function. The left ventricle has no regional wall motion abnormalities. Left ventricular diastolic parameters are indeterminate.  2. Right ventricular systolic function is normal. The right ventricular size is normal. There is normal pulmonary artery systolic pressure.  3. Left atrial size was moderately dilated.  4. The mitral valve is normal in structure. Mild to moderate mitral valve regurgitation. No evidence of mitral stenosis.  5. The aortic valve is normal in structure. Aortic valve regurgitation is not visualized. Aortic valve sclerosis/calcification is present, without any evidence of aortic stenosis. Aortic valve mean gradient measures 8.0 mmHg.  6. There is borderline dilatation of the ascending aorta, measuring 38 mm.  7. The inferior vena cava is normal in size with greater than 50% respiratory variability, suggesting right atrial pressure of 3 mmHg. FINDINGS  Left Ventricle: Left ventricular ejection fraction, by estimation, is 45 to 50%. The left ventricle has mildly decreased function. The left ventricle has no regional wall motion abnormalities. Definity contrast agent was given IV to delineate the left ventricular endocardial borders. The left ventricular internal cavity size was normal in size. There is no left ventricular hypertrophy. Left ventricular diastolic parameters are indeterminate. Right Ventricle: The right ventricular size is normal. No increase in right ventricular wall thickness. Right ventricular systolic function is normal. There is normal pulmonary artery systolic pressure. The tricuspid regurgitant velocity is 1.84 m/s, and  with an assumed right atrial pressure of 3  mmHg, the estimated right ventricular systolic pressure is 16.5 mmHg. Left Atrium: Left atrial size was moderately dilated. Right Atrium: Right atrial size was normal in size. Pericardium: There is no evidence of pericardial effusion. Mitral Valve: The mitral valve is normal in structure. Mild to moderate mitral valve regurgitation. No evidence of mitral valve stenosis. MV peak gradient, 3.7 mmHg. The mean mitral valve gradient is 1.0 mmHg. Tricuspid Valve: The tricuspid valve is normal in structure. Tricuspid valve regurgitation is not demonstrated. No evidence of tricuspid stenosis. Aortic Valve: The aortic valve is normal in structure. Aortic valve regurgitation is not visualized. Aortic valve sclerosis/calcification is present, without any evidence of aortic stenosis. Aortic valve mean gradient measures 8.0 mmHg. Aortic valve peak  gradient measures 16.3 mmHg. Aortic valve area, by VTI measures 1.61 cm. Pulmonic Valve: The pulmonic valve was normal in structure. Pulmonic valve regurgitation is not visualized. No evidence of pulmonic stenosis. Aorta: The aortic root is normal in size and structure. There is borderline dilatation of the ascending aorta, measuring 38 mm. Venous: The inferior vena cava is normal in size with greater than 50% respiratory variability, suggesting right atrial pressure of 3 mmHg. IAS/Shunts: No atrial level shunt detected by color flow Doppler.  LEFT VENTRICLE PLAX 2D LVIDd:         5.30 cm   Diastology LVIDs:         3.40  cm   LV e' medial:    6.20 cm/s LV PW:         1.40 cm   LV E/e' medial:  12.1 LV IVS:        1.30 cm   LV e' lateral:   9.46 cm/s LVOT diam:     2.10 cm   LV E/e' lateral: 7.9 LV SV:         79 LV SV Index:   39 LVOT Area:     3.46 cm  RIGHT VENTRICLE RV Basal diam:  3.60 cm RV Mid diam:    3.30 cm RV S prime:     7.83 cm/s TAPSE (M-mode): 1.8 cm LEFT ATRIUM             Index        RIGHT ATRIUM           Index LA diam:        5.40 cm 2.63 cm/m   RA Area:      17.00 cm LA Vol (A2C):   72.3 ml 35.16 ml/m  RA Volume:   40.10 ml  19.50 ml/m LA Vol (A4C):   52.6 ml 25.58 ml/m LA Biplane Vol: 62.8 ml 30.54 ml/m  AORTIC VALVE                     PULMONIC VALVE AV Area (Vmax):    1.78 cm      PV Vmax:       1.18 m/s AV Area (Vmean):   1.62 cm      PV Peak grad:  5.6 mmHg AV Area (VTI):     1.61 cm AV Vmax:           202.00 cm/s AV Vmean:          133.000 cm/s AV VTI:            0.493 m AV Peak Grad:      16.3 mmHg AV Mean Grad:      8.0 mmHg LVOT Vmax:         104.00 cm/s LVOT Vmean:        62.100 cm/s LVOT VTI:          0.229 m LVOT/AV VTI ratio: 0.46  AORTA Ao Root diam: 4.30 cm Ao Asc diam:  3.80 cm MITRAL VALVE               TRICUSPID VALVE MV Area (PHT): 2.83 cm    TR Peak grad:   13.5 mmHg MV Area VTI:   2.48 cm    TR Vmax:        184.00 cm/s MV Peak grad:  3.7 mmHg MV Mean grad:  1.0 mmHg    SHUNTS MV Vmax:       0.96 m/s    Systemic VTI:  0.23 m MV Vmean:      50.6 cm/s   Systemic Diam: 2.10 cm MV Decel Time: 268 msec MV E velocity: 74.90 cm/s MV A velocity: 95.90 cm/s MV E/A ratio:  0.78 Julien Nordmann MD Electronically signed by Julien Nordmann MD Signature Date/Time: 04/07/2023/6:01:01 PM    Final    CT HEAD WO CONTRAST ( )  Result Date: 04/07/2023 CLINICAL DATA:  Stroke, follow up EXAM: CT HEAD WITHOUT CONTRAST TECHNIQUE: Contiguous axial images were obtained from the base of the skull through the vertex without intravenous contrast. RADIATION DOSE REDUCTION: This exam was performed according to the departmental dose-optimization program which includes automated  exposure control, adjustment of the mA and/or kV according to patient size and/or use of iterative reconstruction technique. COMPARISON:  MR Brain 04/06/23, CT head 04/06/23 FINDINGS: Brain: No hemorrhage. No hydrocephalus. There are prominent subarachnoid spaces bilaterally, unchanged. There is sequela of moderate chronic microvascular ischemic change with a chronic infarct in the left corona  radiata and an acute infarct in the right basal ganglia, better characterized on recent brain MRI. Vascular: No hyperdense vessel or unexpected calcification. Skull: Normal. Negative for fracture or focal lesion. Sinuses/Orbits: No mastoid or middle ear effusion. Paranasal sinuses are clear. Bilateral lens replacement. Orbits are otherwise unremarkable. Other: None. IMPRESSION: 1. No acute intracranial hemorrhage. 2. Acute infarct in the right basal ganglia, better characterized on recent brain MRI. Electronically Signed   By: Lorenza Cambridge M.D.   On: 04/07/2023 16:08    Assessment/Plan 1. Hemiparesis affecting right side as late effect of cerebrovascular accident (CVA) [facility]) Patient with acute stroke confirmed on MRI. Continues to have daytime sleepiness. Patient found to have new atrial fibrillation and was started on eliquis. Will continue to follow up with neurology. PT/OT in SNF. Continue BP control. Encourage activity during the day to minimize risk of delirium. Discussed concern that patient's stroke is a reflection of overall decline and he is at increased risk due to underlying diabetes.   3. Epistaxis 4. New onset atrial fibrillation (HCC)  Rate controlled at this time. Patient has had one episode of nose bleed since arrival. Discussed concern for current dosing based on kidney function and continued nose bleed. Message to Cardiologist to see if lower dose will impact watchman. Dose reduction is recommended based on age and Cr >1.10 according to AAFP management of AC.   5. Type 2 diabetes mellitus with diabetic peripheral angiopathy without gangrene, with long-term current use of insulin (HCC) Patient with elevated A1c during admission at 7.4. Liberal DM control per endocrinologist based on age. Discussed concern for impact of these decisions and increased risk of more strokes and Mis and how this can impact his decision to remain full code. Continue home regimen with Lantus 40 units and Lispro  25 w/ bfast, 20 w/ lunch and 25 w/ dinner. ACHS glucose checks. Fax results to primary endocrinologist based on family preference of co-management.   6. Essential hypertension AKI  Hypokalemia BP elevated today. Paitent typically takes Losartan 100 mg daily and Cr elevated from baseline of 1.1 during admission. Repeat BMP pending. Will resume medications as indicated.    7. Chronic combined systolic and diastolic CHF (congestive heart failure) (HCC) Appears euvolemic at this time. Diuresis held due to AKI. Repeat BMP to determine resumption of daily scheduled Diuretic.   8. Acquired hypothyroidism Continue levothyroxine  9. Gastroesophageal reflux disease, unspecified whether esophagitis present Symptoms well-controlled, continue medication  10. Coronary artery disease of bypass graft with stable angina pectoris, unspecified whether native or transplanted heart (HCC) Hx of quadruple bypass in 1990s. Nitrostat PRN available however rarely uses.   12. RLS New diagnosis this admission. Started Ropinrole, discussed concern for anticholinergic effect. Continue to monitor.   13. COPD Stable with PRN albuterol.    Family/ staff Communication: Daughter, Building control surveyor, nursing  Labs/tests ordered: BMP - need to evaluate kidney function before restarting losartan 100 mg daily, Torsemide and Potassium supplmentation  I spent greater than 50 minutes for the care of this patient in face to face time, chart review, clinical documentation, patient education. I spent an additional 16 minutes discussing goals of care and advanced care  planning. Patient remains full code and would return to the hospital in the event of emergency.

## 2023-04-13 LAB — CBC AND DIFFERENTIAL
HCT: 34 — AB (ref 41–53)
Hemoglobin: 11.3 — AB (ref 13.5–17.5)
Neutrophils Absolute: 1853
WBC: 4.8

## 2023-04-13 LAB — BASIC METABOLIC PANEL
BUN: 13 (ref 4–21)
CO2: 23 — AB (ref 13–22)
Chloride: 107 (ref 99–108)
Creatinine: 1.1 (ref 0.6–1.3)
Glucose: 114
Potassium: 3.7 mEq/L (ref 3.5–5.1)
Sodium: 138 (ref 137–147)

## 2023-04-13 LAB — CBC: RBC: 3.71 — AB (ref 3.87–5.11)

## 2023-04-13 LAB — COMPREHENSIVE METABOLIC PANEL
Calcium: 8.4 — AB (ref 8.7–10.7)
eGFR: 61

## 2023-04-20 LAB — BASIC METABOLIC PANEL
BUN: 19 (ref 4–21)
CO2: 27 — AB (ref 13–22)
Chloride: 107 (ref 99–108)
Creatinine: 1.2 (ref 0.6–1.3)
Glucose: 136
Potassium: 3.8 mEq/L (ref 3.5–5.1)
Sodium: 140 (ref 137–147)

## 2023-04-20 LAB — COMPREHENSIVE METABOLIC PANEL
Calcium: 9 (ref 8.7–10.7)
eGFR: 58

## 2023-04-21 ENCOUNTER — Non-Acute Institutional Stay (SKILLED_NURSING_FACILITY): Payer: PPO | Admitting: Student

## 2023-04-21 ENCOUNTER — Encounter: Payer: Self-pay | Admitting: Student

## 2023-04-21 DIAGNOSIS — I4821 Permanent atrial fibrillation: Secondary | ICD-10-CM

## 2023-04-21 DIAGNOSIS — R040 Epistaxis: Secondary | ICD-10-CM

## 2023-04-21 DIAGNOSIS — Z7901 Long term (current) use of anticoagulants: Secondary | ICD-10-CM | POA: Diagnosis not present

## 2023-04-21 NOTE — Progress Notes (Unsigned)
Location:  Other [facility] Room Number: [facility] [address] Place of Service:  SNF [phone]) Provider:  Earnestine Mealing, MD  PCP: Bosie Clos, MD  Patient Care Team: Bosie Clos, MD as PCP - General (Family Medicine) Mariah Milling Tollie Pizza, MD as PCP - Cardiology (Cardiology) Tedd Sias Marlana Salvage, MD as Physician Assistant (Endocrinology) Gertha Calkin, MD as Referring Physician (Internal Medicine)  Extended Emergency Contact Information Primary Emergency Contact: [facility] Phone: [phone] Relation: Daughter Secondary Emergency Contact: [facility] Phone: [phone] Relation: Son  Code Status:  Full Code Goals of care: Advanced Directive information    04/21/2023   11:35 AM  Advanced Directives  Does Patient Have a Medical Advance Directive? Yes  Type of Advance Directive Healthcare Power of Attorney  Does patient want to make changes to medical advance directive? No - Patient declined  Copy of Healthcare Power of Attorney in Chart? Yes - validated most recent copy scanned in chart (See row information)     Chief Complaint  Patient presents with   Acute Visit    Follow up Cardiology     HPI:  Pt is a 89 y.o. male seen today for an acute visit for Follow up Cardiology  Patient is doing well and ready for his meal.   Discussed with his daughter and HCPOA information shared from cardiologist, that patient is not a candidate for the Watchman procedure given his age. They are disappointed, however, would like to continue anticoagulation. Discussed risk of bleeding and they are okay with this risk at this time.    Past Medical History:  Diagnosis Date   Acute respiratory failure with hypoxia (HCC) 03/21/2016   CAD (coronary artery disease)    a. s/p CABG;  b. 07/2012 Cath: 3VD including 80 dLCX (med Rx), 4/4 patent grafts.   Chronic diastolic CHF (congestive heart failure) (HCC)    a. 07/2012 Echo: EF 55-60%, no rwma, Gr 1 DD, mild MR;   04/2014 Echo: EF 55-60%, no rwma, mild MR, mildly dil LA.   Diabetes mellitus, type 2 (HCC)    HTN (hypertension)    Hyperlipidemia    Hypothyroidism    Lower extremity edema    a. 03/2014 amlodipine d/c'd.   Spinal stenosis    Past Surgical History:  Procedure Laterality Date   APPENDECTOMY     CARDIAC CATHETERIZATION  07/2012   [facility]/no stents   CHOLECYSTECTOMY     CORNEAL TRANSPLANT     CORONARY ARTERY BYPASS GRAFT  1994   HEMORRHOID SURGERY     LEFT HEART CATH AND CORS/GRAFTS ANGIOGRAPHY N/A 12/17/2019   Procedure: LEFT HEART CATH AND CORS/GRAFTS ANGIOGRAPHY;  Surgeon: Yvonne Kendall, MD;  Location: [facility] INVASIVE CV LAB;  Service: Cardiovascular;  Laterality: N/A;   PROSTATE BIOPSY     TOE SURGERY     ingrown toe nail    Allergies  Allergen Reactions   Farxiga [Dapagliflozin] Rash    Genital rash    Outpatient Encounter Medications as of 04/21/2023  Medication Sig   apixaban (ELIQUIS) 5 MG TABS tablet Take 1 tablet (5 mg total) by mouth 2 (two) times daily.   atorvastatin (LIPITOR) 80 MG tablet Take 1 tablet (80 mg total) by mouth daily.   fenofibrate 54 MG tablet Take 1 tablet (54 mg total) by mouth daily.   Infant Care Products [facility]) OINT Apply 1 application  topically daily as needed. Apply to peri area topically every shift for skin protectant   insulin aspart (NOVOLOG) 100 UNIT/ML  injection Inject 20 units once daily. Inject 25 units once daily. Inject 25 units once daily.   insulin glargine (LANTUS) 100 UNIT/ML injection Inject 40 Units into the skin daily.   levothyroxine (SYNTHROID) 50 MCG tablet TAKE 1 TABLET BY MOUTH ONCE DAILY BEFORE BREAKFAST   losartan (COZAAR) 100 MG tablet Take 100 mg by mouth daily.   nitroGLYCERIN (NITROSTAT) 0.4 MG SL tablet Place 0.4 mg under the tongue every 5 (five) minutes as needed for chest pain.   omeprazole (PRILOSEC) 20 MG capsule Take 1 capsule by mouth once daily   oxymetazoline (AFRIN) 0.05 % nasal spray Place 2 sprays  into both nostrils once for nose bleed, seek medical attention if bleeding persists after 2 sprays, do not repeat dose   potassium chloride (KLOR-CON) 10 MEQ tablet Take 10 mEq by mouth daily.   rOPINIRole (REQUIP) 0.5 MG tablet Take 1 tablet (0.5 mg total) by mouth 2 (two) times daily.   torsemide (DEMADEX) 20 MG tablet Take 20 mg by mouth daily.   Zinc Oxide (TRIPLE PASTE) 12.8 % ointment Apply 1 Application topically as needed for irritation. Every shift.   [DISCONTINUED] hydrALAZINE (APRESOLINE) 50 MG tablet Take 50 mg by mouth daily.   No facility-administered encounter medications on file as of 04/21/2023.    Review of Systems  Immunization History  Administered Date(s) Administered   COVID-19, mRNA, vaccine(Comirnaty)12 years and older 04/15/2020   Fluad Quad(high Dose 65+) 11/07/2019, 09/30/2021, 09/23/2022   Influenza Split 08/23/2011   Influenza, High Dose Seasonal PF 09/01/2015, 08/08/2017   Influenza,inj,Quad PF,6+ Mos 08/05/2013, 08/04/2014, 08/18/2016   Influenza-Unspecified 09/23/2022   PFIZER(Purple Top)SARS-COV-2 Vaccination 02/24/2020, 03/18/2020   Pneumococcal Conjugate-13 03/24/2015   Pneumococcal Polysaccharide-23 10/18/2014   Td (Adult),unspecified 04/01/2004   Tdap 08/04/2014   Zoster, Live 08/04/2014   Pertinent  Health Maintenance Due  Topic Date Due   OPHTHALMOLOGY EXAM  11/17/2020   FOOT EXAM  09/22/2021   INFLUENZA VACCINE  06/22/2023   HEMOGLOBIN A1C  10/08/2023      01/25/2021    1:39 PM 01/05/2022    2:12 PM 03/02/2022    3:45 PM 11/10/2022    8:25 AM 01/13/2023   10:23 AM  Fall Risk  Falls in the past year? 0 0 0 1 0  Was there an injury with Fall? 0  0 0 0  Fall Risk Category Calculator 0  0 1 0  Fall Risk Category (Retired) Low  Low Low   (RETIRED) Patient Fall Risk Level   Low fall risk Low fall risk   Patient at Risk for Falls Due to   No Fall Risks No Fall Risks No Fall Risks  Fall risk Follow up   Falls evaluation completed  Falls  evaluation completed   Functional Status Survey:    Vitals:   04/21/23 1107 04/21/23 1314  BP: (!) 165/62 (!) 149/74  Pulse: 68   Resp: 16   Temp: 98 F (36.7 C)   SpO2: 98%   Weight: 207 lb 6.4 oz (94.1 kg)   Height: 5\' 7"  (1.702 m)    Body mass index is 32.48 kg/m. Physical Exam Constitutional:      Appearance: Normal appearance.  Pulmonary:     Effort: Pulmonary effort is normal.  Neurological:     Mental Status: He is alert and oriented to person, place, and time.     Labs reviewed: Recent Labs    06/01/22 1532 06/08/22 1338 12/12/22 1610 12/13/22 9604 04/09/23 0423 04/10/23 0418 04/10/23 1452  04/11/23 0528 04/20/23 0000  NA 139   < > 140   < > 137 139  --  140 140  K 4.7   < > 3.2*   < > 3.6 3.9  --  3.8 3.8  CL 100   < > 107   < > 109 111  --  111 107  CO2 24   < > 25   < > 21* 20*  --  21* 27*  GLUCOSE 191*   < > 136*   < > 157* 159*  --  189*  --   BUN 20   < > 15   < > 16 23  --  20 19  CREATININE 1.21   < > 0.96   < > 1.17 1.64* 1.45* 1.29* 1.2  CALCIUM 9.3   < > 8.5*   < > 8.6* 8.9  --  9.2 9.0  MG  --   --  1.7  --   --  2.0  --  1.9  --   PHOS 3.4  --   --   --   --   --   --   --   --    < > = values in this interval not displayed.   Recent Labs    06/01/22 1532 04/06/23 1206  AST  --  30  ALT  --  15  ALKPHOS  --  39  BILITOT  --  1.0  PROT  --  6.3*  ALBUMIN 3.9 3.3*   Recent Labs    04/08/23 0408 04/09/23 0423 04/10/23 0418  WBC 5.5 6.9 7.3  NEUTROABS 2.1 3.3 3.0  HGB 11.9* 12.5* 12.4*  HCT 35.5* 37.9* 37.0*  MCV 89.6 91.3 90.9  PLT 232 227 240   Lab Results  Component Value Date   TSH 1.962 04/06/2023   Lab Results  Component Value Date   HGBA1C 6.9 (H) 04/07/2023   Lab Results  Component Value Date   CHOL 126 04/07/2023   HDL 28 (L) 04/07/2023   LDLCALC 70 04/07/2023   TRIG 138 04/07/2023   CHOLHDL 4.5 04/07/2023    Significant Diagnostic Results in last 30 days:  ECHOCARDIOGRAM COMPLETE  Result Date:  04/07/2023    ECHOCARDIOGRAM REPORT   Patient Name:   Joseph [name] Date of Exam: 04/07/2023 Medical Rec #:  [id number]         Height:       67.0 in Accession #:    [id number]        Weight:       208.0 lb Date of Birth:  12-08-1924        BSA:          2.056 m Patient Age:    89 years          BP:           151/55 mmHg Patient Gender: M                 HR:           54 bpm. Exam Location:  [facility] Procedure: 2D Echo, Cardiac Doppler, Color Doppler and Intracardiac            Opacification Agent Indications:     Sroke  History:         Patient has prior history of Echocardiogram examinations, most                  recent 12/11/2022.  CHF, CAD and Previous Myocardial Infarction,                  Prior CABG, Stroke and COPD, Arrythmias:Atrial Fibrillation and                  Bradycardia, Signs/Symptoms:Edema, Dyspnea, Fatigue and                  Shortness of Breath; Risk Factors:Hypertension, Diabetes and                  Dyslipidemia.  Sonographer:     Mikki Harbor Referring Phys:  [id number] Lorretta Harp Diagnosing Phys: Julien Nordmann MD  Sonographer Comments: Image acquisition challenging due to COPD. IMPRESSIONS  1. Left ventricular ejection fraction, by estimation, is 45 to 50%. The left ventricle has mildly decreased function. The left ventricle has no regional wall motion abnormalities. Left ventricular diastolic parameters are indeterminate.  2. Right ventricular systolic function is normal. The right ventricular size is normal. There is normal pulmonary artery systolic pressure.  3. Left atrial size was moderately dilated.  4. The mitral valve is normal in structure. Mild to moderate mitral valve regurgitation. No evidence of mitral stenosis.  5. The aortic valve is normal in structure. Aortic valve regurgitation is not visualized. Aortic valve sclerosis/calcification is present, without any evidence of aortic stenosis. Aortic valve mean gradient measures 8.0 mmHg.  6. There is borderline dilatation of the  ascending aorta, measuring 38 mm.  7. The inferior vena cava is normal in size with greater than 50% respiratory variability, suggesting right atrial pressure of 3 mmHg. FINDINGS  Left Ventricle: Left ventricular ejection fraction, by estimation, is 45 to 50%. The left ventricle has mildly decreased function. The left ventricle has no regional wall motion abnormalities. Definity contrast agent was given IV to delineate the left ventricular endocardial borders. The left ventricular internal cavity size was normal in size. There is no left ventricular hypertrophy. Left ventricular diastolic parameters are indeterminate. Right Ventricle: The right ventricular size is normal. No increase in right ventricular wall thickness. Right ventricular systolic function is normal. There is normal pulmonary artery systolic pressure. The tricuspid regurgitant velocity is 1.84 m/s, and  with an assumed right atrial pressure of 3 mmHg, the estimated right ventricular systolic pressure is 16.5 mmHg. Left Atrium: Left atrial size was moderately dilated. Right Atrium: Right atrial size was normal in size. Pericardium: There is no evidence of pericardial effusion. Mitral Valve: The mitral valve is normal in structure. Mild to moderate mitral valve regurgitation. No evidence of mitral valve stenosis. MV peak gradient, 3.7 mmHg. The mean mitral valve gradient is 1.0 mmHg. Tricuspid Valve: The tricuspid valve is normal in structure. Tricuspid valve regurgitation is not demonstrated. No evidence of tricuspid stenosis. Aortic Valve: The aortic valve is normal in structure. Aortic valve regurgitation is not visualized. Aortic valve sclerosis/calcification is present, without any evidence of aortic stenosis. Aortic valve mean gradient measures 8.0 mmHg. Aortic valve peak  gradient measures 16.3 mmHg. Aortic valve area, by VTI measures 1.61 cm. Pulmonic Valve: The pulmonic valve was normal in structure. Pulmonic valve regurgitation is not  visualized. No evidence of pulmonic stenosis. Aorta: The aortic root is normal in size and structure. There is borderline dilatation of the ascending aorta, measuring 38 mm. Venous: The inferior vena cava is normal in size with greater than 50% respiratory variability, suggesting right atrial pressure of 3 mmHg. IAS/Shunts: No atrial level shunt detected by color flow Doppler.  LEFT VENTRICLE PLAX 2D LVIDd:         5.30 cm   Diastology LVIDs:         3.40 cm   LV e' medial:    6.20 cm/s LV PW:         1.40 cm   LV E/e' medial:  12.1 LV IVS:        1.30 cm   LV e' lateral:   9.46 cm/s LVOT diam:     2.10 cm   LV E/e' lateral: 7.9 LV SV:         79 LV SV Index:   39 LVOT Area:     3.46 cm  RIGHT VENTRICLE RV Basal diam:  3.60 cm RV Mid diam:    3.30 cm RV S prime:     7.83 cm/s TAPSE (M-mode): 1.8 cm LEFT ATRIUM             Index        RIGHT ATRIUM           Index LA diam:        5.40 cm 2.63 cm/m   RA Area:     17.00 cm LA Vol (A2C):   72.3 ml 35.16 ml/m  RA Volume:   40.10 ml  19.50 ml/m LA Vol (A4C):   52.6 ml 25.58 ml/m LA Biplane Vol: 62.8 ml 30.54 ml/m  AORTIC VALVE                     PULMONIC VALVE AV Area (Vmax):    1.78 cm      PV Vmax:       1.18 m/s AV Area (Vmean):   1.62 cm      PV Peak grad:  5.6 mmHg AV Area (VTI):     1.61 cm AV Vmax:           202.00 cm/s AV Vmean:          133.000 cm/s AV VTI:            0.493 m AV Peak Grad:      16.3 mmHg AV Mean Grad:      8.0 mmHg LVOT Vmax:         104.00 cm/s LVOT Vmean:        62.100 cm/s LVOT VTI:          0.229 m LVOT/AV VTI ratio: 0.46  AORTA Ao Root diam: 4.30 cm Ao Asc diam:  3.80 cm MITRAL VALVE               TRICUSPID VALVE MV Area (PHT): 2.83 cm    TR Peak grad:   13.5 mmHg MV Area VTI:   2.48 cm    TR Vmax:        184.00 cm/s MV Peak grad:  3.7 mmHg MV Mean grad:  1.0 mmHg    SHUNTS MV Vmax:       0.96 m/s    Systemic VTI:  0.23 m MV Vmean:      50.6 cm/s   Systemic Diam: 2.10 cm MV Decel Time: 268 msec MV E velocity: 74.90 cm/s MV A  velocity: 95.90 cm/s MV E/A ratio:  0.78 Julien Nordmann MD Electronically signed by Julien Nordmann MD Signature Date/Time: 04/07/2023/6:01:01 PM    Final    CT HEAD WO CONTRAST ( )  Result Date: 04/07/2023 CLINICAL DATA:  Stroke, follow up EXAM: CT HEAD WITHOUT CONTRAST TECHNIQUE: Contiguous axial images were obtained  from the base of the skull through the vertex without intravenous contrast. RADIATION DOSE REDUCTION: This exam was performed according to the departmental dose-optimization program which includes automated exposure control, adjustment of the mA and/or kV according to patient size and/or use of iterative reconstruction technique. COMPARISON:  MR Brain 04/06/23, CT head 04/06/23 FINDINGS: Brain: No hemorrhage. No hydrocephalus. There are prominent subarachnoid spaces bilaterally, unchanged. There is sequela of moderate chronic microvascular ischemic change with a chronic infarct in the left corona radiata and an acute infarct in the right basal ganglia, better characterized on recent brain MRI. Vascular: No hyperdense vessel or unexpected calcification. Skull: Normal. Negative for fracture or focal lesion. Sinuses/Orbits: No mastoid or middle ear effusion. Paranasal sinuses are clear. Bilateral lens replacement. Orbits are otherwise unremarkable. Other: None. IMPRESSION: 1. No acute intracranial hemorrhage. 2. Acute infarct in the right basal ganglia, better characterized on recent brain MRI. Electronically Signed   By: Lorenza Cambridge M.D.   On: 04/07/2023 16:08   CT ANGIO HEAD NECK W WO CM  Result Date: 04/06/2023 CLINICAL DATA:  Initial evaluation for neuro deficit, stroke. EXAM: CT ANGIOGRAPHY HEAD AND NECK WITH AND WITHOUT CONTRAST TECHNIQUE: Multidetector CT imaging of the head and neck was performed using the standard protocol during bolus administration of intravenous contrast. Multiplanar CT image reconstructions and MIPs were obtained to evaluate the vascular anatomy. Carotid stenosis  measurements (when applicable) are obtained utilizing NASCET criteria, using the distal internal carotid diameter as the denominator. RADIATION DOSE REDUCTION: This exam was performed according to the departmental dose-optimization program which includes automated exposure control, adjustment of the mA and/or kV according to patient size and/or use of iterative reconstruction technique. CONTRAST:  75mL OMNIPAQUE IOHEXOL 350 MG/ML SOLN COMPARISON:  MRI and CT from earlier the same day. FINDINGS: CTA NECK FINDINGS Aortic arch: Visualized aortic arch normal caliber with standard branch pattern. Moderate atheromatous change about the arch itself. No high-grade stenosis about the origin the great vessels. Right carotid system: Right common and internal carotid arteries are patent without dissection. Mild to moderate atheromatous change about the right carotid bulb without hemodynamically significant greater 50% stenosis. Left carotid system: Left common and internal carotid arteries are patent without dissection. Moderate atheromatous change about the left carotid bulb without hemodynamically significant greater than 50% stenosis. Vertebral arteries: Both vertebral arteries arise from subclavian arteries. No significant proximal subclavian artery stenosis. Atheromatous plaque at the origin of the right vertebral artery with associated severe stenosis (series 6, image 264). Vertebral arteries otherwise patent without significant stenosis or dissection. Skeleton: No discrete or worrisome osseous lesions. C6 and C7 vertebral bodies are ankylosed. Underlying moderate cervical spondylosis. Prior median sternotomy. Other neck: No other acute abnormality within the neck. Upper chest: Visualized upper chest demonstrates no other acute finding. Review of the MIP images confirms the above findings CTA HEAD FINDINGS Anterior circulation: Atheromatous change about the carotid siphons with associated mild diffuse narrowing on the  right with more moderate stenosis on the left. A1 segments patent bilaterally. No aneurysm. Left A1 hypoplastic, accounting for the mildly diminutive left ICA is compared to the right. Normal anterior communicating complex. Anterior cerebral arteries patent without stenosis. No M1 stenosis or occlusion. No proximal MCA branch occlusion or high-grade stenosis. Distal MCA branches grossly perfused and symmetric. Diffuse small vessel atheromatous irregularity noted. Posterior circulation: Atheromatous change about the proximal V4 segments bilaterally without hemodynamically significant stenosis. Both PICA grossly patent at their origins. Basilar patent without stenosis. Superior cerebral arteries patent bilaterally.  Both PCAs primarily supplied via the basilar. Atheromatous change about the PCAs bilaterally. No significant stenosis on the left. Moderate proximal right P2 stenoses noted (series 9, image 21). Right PCA remains patent to its distal aspect. Venous sinuses: Not well assessed due to arterial timing the contrast bolus. Anatomic variants: As above.  No aneurysm. Review of the MIP images confirms the above findings IMPRESSION: 1. Negative CTA for large vessel occlusion or other emergent finding. 2. Moderate atheromatous change about the carotid siphons with associated mild to moderate stenosis, left worse than right. 3. Additional intracranial atheromatous disease with associated moderate proximal right P2 stenoses. Diffuse small vessel atheromatous irregularity. 4. Atheromatous plaque at the origin of the right vertebral artery with associated severe stenosis. Electronically Signed   By: Rise Mu M.D.   On: 04/06/2023 19:27   MR BRAIN WO CONTRAST  Result Date: 04/06/2023 CLINICAL DATA:  Generalized weakness, stroke suspected EXAM: MRI HEAD WITHOUT CONTRAST TECHNIQUE: Multiplanar, multiecho pulse sequences of the brain and surrounding structures were obtained without intravenous contrast.  COMPARISON:  12/09/2022 MRI head, correlation is also made with same day CT head FINDINGS: Brain: Restricted diffusion with ADC correlate in the right posterior limb of the internal capsule and corona radiata (series 8, images 26), which measures up to 15 x 6 x 9 mm (AP x TR x CC) this is associated with increased T2 hyperintense signal. No acute hemorrhage, mass, mass effect, or midline shift. No hydrocephalus or extra-axial collection. Normal craniocervical junction. No hemosiderin deposition to suggest remote hemorrhage. Sequela of prior infarct in the left posterior limb of the internal capsule and corona radiata. Vascular: Normal arterial flow voids. Skull and upper cervical spine: Normal marrow signal. Sinuses/Orbits: Clear paranasal sinuses. No acute finding in the orbits. Status post bilateral lens replacements. Other: Trace fluid in right mastoid air cells. IMPRESSION: Acute infarct in the right posterior limb of the internal capsule and corona radiata. These results were called by telephone at the time of interpretation on 04/06/2023 at 4:39 pm to provider [facility] , who verbally acknowledged these results. Electronically Signed   By: Wiliam Ke M.D.   On: 04/06/2023 16:42   CT HEAD WO CONTRAST ( )  Result Date: 04/06/2023 CLINICAL DATA:  Neuro deficit, acute, stroke suspected. Additional history provided: Weakness, prior stroke. EXAM: CT HEAD WITHOUT CONTRAST TECHNIQUE: Contiguous axial images were obtained from the base of the skull through the vertex without intravenous contrast. RADIATION DOSE REDUCTION: This exam was performed according to the departmental dose-optimization program which includes automated exposure control, adjustment of the mA and/or kV according to patient size and/or use of iterative reconstruction technique. COMPARISON:  Non-contrast head CT 12/10/2022. Brain MRI 12/09/2022. FINDINGS: Brain: Known chronic infarct within the left corona radiata/posterior limb of left internal  capsule. There is no acute intracranial hemorrhage. No demarcated cortical infarct. No extra-axial fluid collection. No evidence of an intracranial mass. No midline shift. Vascular: No hyperdense vessel. Atherosclerotic calcifications. Skull: No fracture or aggressive osseous lesion. Sinuses/Orbits: No mass or acute finding within the imaged orbits. No significant paranasal sinus disease at the imaged levels. IMPRESSION: 1.  No evidence of an acute intracranial abnormality. 2. 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 Electronically Signed   By: Jackey Loge D.O.   On: 04/06/2023 13:09    Assessment/Plan Permanent atrial fibrillation (HCC)  Chronic anticoagulation  Epistaxis Patient has had numerous nose bleeds since arrival with anticoagulation. Despite this finding, family would like to  continue anticoagulation. Afrin soaked gauzed daily prn for nose bleeds. Continue to monitor.    Family/ staff Communication: nursing, spouse  Labs/tests ordered:  none

## 2023-04-23 DIAGNOSIS — Z7901 Long term (current) use of anticoagulants: Secondary | ICD-10-CM | POA: Insufficient documentation

## 2023-04-25 ENCOUNTER — Encounter: Payer: Self-pay | Admitting: Nurse Practitioner

## 2023-04-25 ENCOUNTER — Non-Acute Institutional Stay (SKILLED_NURSING_FACILITY): Payer: PPO | Admitting: Nurse Practitioner

## 2023-04-25 DIAGNOSIS — K219 Gastro-esophageal reflux disease without esophagitis: Secondary | ICD-10-CM | POA: Diagnosis not present

## 2023-04-25 DIAGNOSIS — E1151 Type 2 diabetes mellitus with diabetic peripheral angiopathy without gangrene: Secondary | ICD-10-CM | POA: Diagnosis not present

## 2023-04-25 DIAGNOSIS — E039 Hypothyroidism, unspecified: Secondary | ICD-10-CM | POA: Diagnosis not present

## 2023-04-25 DIAGNOSIS — I25708 Atherosclerosis of coronary artery bypass graft(s), unspecified, with other forms of angina pectoris: Secondary | ICD-10-CM | POA: Diagnosis not present

## 2023-04-25 DIAGNOSIS — I10 Essential (primary) hypertension: Secondary | ICD-10-CM

## 2023-04-25 DIAGNOSIS — E782 Mixed hyperlipidemia: Secondary | ICD-10-CM

## 2023-04-25 DIAGNOSIS — Z7901 Long term (current) use of anticoagulants: Secondary | ICD-10-CM | POA: Diagnosis not present

## 2023-04-25 DIAGNOSIS — G629 Polyneuropathy, unspecified: Secondary | ICD-10-CM

## 2023-04-25 DIAGNOSIS — G2581 Restless legs syndrome: Secondary | ICD-10-CM | POA: Diagnosis not present

## 2023-04-25 DIAGNOSIS — I4821 Permanent atrial fibrillation: Secondary | ICD-10-CM | POA: Diagnosis not present

## 2023-04-25 DIAGNOSIS — R040 Epistaxis: Secondary | ICD-10-CM

## 2023-04-25 DIAGNOSIS — I5042 Chronic combined systolic (congestive) and diastolic (congestive) heart failure: Secondary | ICD-10-CM | POA: Diagnosis not present

## 2023-04-25 DIAGNOSIS — Z794 Long term (current) use of insulin: Secondary | ICD-10-CM

## 2023-04-25 NOTE — Progress Notes (Deleted)
Location:  Other [place].  Nursing Home Room Number: [facility] 107A Place of Service:  SNF [phone]) Abbey Chatters, NP  PCP: Bosie Clos, MD  Patient Care Team: Bosie Clos, MD as PCP - General (Family Medicine) Mariah Milling Tollie Pizza, MD as PCP - Cardiology (Cardiology) Tedd Sias Marlana Salvage, MD as Physician Assistant (Endocrinology) Gertha Calkin, MD as Referring Physician (Internal Medicine)  Extended Emergency Contact Information Primary Emergency Contact: [facility] Phone: [phone] Relation: Daughter Secondary Emergency Contact: [facility] Phone: [phone] Relation: Son  Goals of care: Advanced Directive information    04/25/2023    3:22 PM  Advanced Directives  Does Patient Have a Medical Advance Directive? Yes  Type of Advance Directive Healthcare Power of Attorney  Does patient want to make changes to medical advance directive? No - Patient declined  Copy of Healthcare Power of Attorney in Chart? Yes - validated most recent copy scanned in chart (See row information)     Chief Complaint  Patient presents with   Discharge Note    Discharge.     HPI:  Pt is a 89 y.o. male seen today for Discharge   Past Medical History:  Diagnosis Date   Acute respiratory failure with hypoxia (HCC) 03/21/2016   CAD (coronary artery disease)    a. s/p CABG;  b. 07/2012 Cath: 3VD including 80 dLCX (med Rx), 4/4 patent grafts.   Chronic diastolic CHF (congestive heart failure) (HCC)    a. 07/2012 Echo: EF 55-60%, no rwma, Gr 1 DD, mild MR;  04/2014 Echo: EF 55-60%, no rwma, mild MR, mildly dil LA.   Diabetes mellitus, type 2 (HCC)    HTN (hypertension)    Hyperlipidemia    Hypothyroidism    Lower extremity edema    a. 03/2014 amlodipine d/c'd.   Spinal stenosis    Past Surgical History:  Procedure Laterality Date   APPENDECTOMY     CARDIAC CATHETERIZATION  07/2012   [facility]/no stents   CHOLECYSTECTOMY     CORNEAL TRANSPLANT     CORONARY ARTERY  BYPASS GRAFT  1994   HEMORRHOID SURGERY     LEFT HEART CATH AND CORS/GRAFTS ANGIOGRAPHY N/A 12/17/2019   Procedure: LEFT HEART CATH AND CORS/GRAFTS ANGIOGRAPHY;  Surgeon: Yvonne Kendall, MD;  Location: [facility] INVASIVE CV LAB;  Service: Cardiovascular;  Laterality: N/A;   PROSTATE BIOPSY     TOE SURGERY     ingrown toe nail    Allergies  Allergen Reactions   Farxiga [Dapagliflozin] Rash    Genital rash    Outpatient Encounter Medications as of 04/25/2023  Medication Sig   apixaban (ELIQUIS) 5 MG TABS tablet Take 1 tablet (5 mg total) by mouth 2 (two) times daily.   atorvastatin (LIPITOR) 80 MG tablet Take 1 tablet (80 mg total) by mouth daily.   fenofibrate 54 MG tablet Take 1 tablet (54 mg total) by mouth daily.   Infant [employer] Products [facility]) OINT Apply 1 application  topically daily as needed. Apply to peri area topically every shift for skin protectant   insulin aspart (NOVOLOG) 100 UNIT/ML injection Inject 20 units once daily in the afternoon. Inject 25 units once daily in the eveing. Inject 25 units once daily.   insulin glargine (LANTUS) 100 UNIT/ML injection Inject 40 Units into the skin daily.   levothyroxine (SYNTHROID) 50 MCG tablet TAKE 1 TABLET BY MOUTH ONCE DAILY BEFORE BREAKFAST   losartan (COZAAR) 100 MG tablet Take 100 mg by mouth daily.   nitroGLYCERIN (NITROSTAT) 0.4 MG  SL tablet Place 0.4 mg under the tongue every 5 (five) minutes as needed for chest pain.   omeprazole (PRILOSEC) 20 MG capsule Take 1 capsule by mouth once daily   oxymetazoline (AFRIN) 0.05 % nasal spray Place 2 sprays into both nostrils once for nose bleed, seek medical attention if bleeding persists after 2 sprays, do not repeat dose   potassium chloride (KLOR-CON) 10 MEQ tablet Take 10 mEq by mouth daily.   rOPINIRole (REQUIP) 0.5 MG tablet Take 1 tablet (0.5 mg total) by mouth 2 (two) times daily.   torsemide (DEMADEX) 20 MG tablet Take 20 mg by mouth daily.   Zinc Oxide (TRIPLE PASTE) 12.8 %  ointment Apply 1 Application topically as needed for irritation. Every shift.   No facility-administered encounter medications on file as of 04/25/2023.    Review of Systems ***  Immunization History  Administered Date(s) Administered   COVID-19, mRNA, vaccine(Comirnaty)12 years and older 04/15/2020   Fluad Quad(high Dose 65+) 11/07/2019, 09/30/2021, 09/23/2022   Influenza Split 08/23/2011   Influenza, High Dose Seasonal PF 09/01/2015, 08/08/2017   Influenza,inj,Quad PF,6+ Mos 08/05/2013, 08/04/2014, 08/18/2016   Influenza-Unspecified 09/23/2022   PFIZER(Purple Top)SARS-COV-2 Vaccination 02/24/2020, 03/18/2020   PNEUMOCOCCAL CONJUGATE-20 04/13/2023   Pneumococcal Conjugate-13 03/24/2015   Pneumococcal Polysaccharide-23 10/18/2014   Td (Adult),unspecified 04/01/2004   Tdap 08/04/2014   Zoster, Live 08/04/2014   Pertinent  Health Maintenance Due  Topic Date Due   OPHTHALMOLOGY EXAM  11/17/2020   FOOT EXAM  09/22/2021   INFLUENZA VACCINE  06/22/2023   HEMOGLOBIN A1C  10/08/2023      01/25/2021    1:39 PM 01/05/2022    2:12 PM 03/02/2022    3:45 PM 11/10/2022    8:25 AM 01/13/2023   10:23 AM  Fall Risk  Falls in the past year? 0 0 0 1 0  Was there an injury with Fall? 0  0 0 0  Fall Risk Category Calculator 0  0 1 0  Fall Risk Category (Retired) Low  Low Low   (RETIRED) Patient Fall Risk Level   Low fall risk Low fall risk   Patient at Risk for Falls Due to   No Fall Risks No Fall Risks No Fall Risks  Fall risk Follow up   Falls evaluation completed  Falls evaluation completed   Functional Status Survey:    Vitals:   04/25/23 1505 04/25/23 1528  BP: (!) 153/85 (!) 154/75  Pulse: 70   Resp: 18   Temp: 97.6 F (36.4 C)   SpO2: 93%   Weight: 207 lb 8 oz (94.1 kg)   Height: 5\' 7"  (1.702 m)    Body mass index is 32.5 kg/m. Physical Exam***  Labs reviewed: Recent Labs    06/01/22 1532 06/08/22 1338 12/12/22 0527 12/13/22 0523 04/09/23 0423 04/10/23 0418  04/10/23 1452 04/11/23 0528 04/13/23 0000 04/20/23 0000  NA 139   < > 140   < > 137 139  --  140 138 140  K 4.7   < > 3.2*   < > 3.6 3.9  --  3.8 3.7 3.8  CL 100   < > 107   < > 109 111  --  111 107 107  CO2 24   < > 25   < > 21* 20*  --  21* 23* 27*  GLUCOSE 191*   < > 136*   < > 157* 159*  --  189*  --   --   BUN 20   < >  15   < > 16 23  --  20 13 19   CREATININE 1.21   < > 0.96   < > 1.17 1.64*   < > 1.29* 1.1 1.2  CALCIUM 9.3   < > 8.5*   < > 8.6* 8.9  --  9.2 8.4* 9.0  MG  --   --  1.7  --   --  2.0  --  1.9  --   --   PHOS 3.4  --   --   --   --   --   --   --   --   --    < > = values in this interval not displayed.   Recent Labs    06/01/22 1532 04/06/23 1206  AST  --  30  ALT  --  15  ALKPHOS  --  39  BILITOT  --  1.0  PROT  --  6.3*  ALBUMIN 3.9 3.3*   Recent Labs    04/08/23 0408 04/09/23 0423 04/10/23 0418 04/13/23 0000  WBC 5.5 6.9 7.3 4.8  NEUTROABS 2.1 3.3 3.0 1,853.00  HGB 11.9* 12.5* 12.4* 11.3*  HCT 35.5* 37.9* 37.0* 34*  MCV 89.6 91.3 90.9  --   PLT 232 227 240  --    Lab Results  Component Value Date   TSH 1.962 04/06/2023   Lab Results  Component Value Date   HGBA1C 6.9 (H) 04/07/2023   Lab Results  Component Value Date   CHOL 126 04/07/2023   HDL 28 (L) 04/07/2023   LDLCALC 70 04/07/2023   TRIG 138 04/07/2023   CHOLHDL 4.5 04/07/2023    Significant Diagnostic Results in last 30 days:  ECHOCARDIOGRAM COMPLETE  Result Date: 04/07/2023    ECHOCARDIOGRAM REPORT   Patient Name:   Joseph [name] Date of Exam: 04/07/2023 Medical Rec #:  [id number]         Height:       67.0 in Accession #:    [id number]        Weight:       208.0 lb Date of Birth:  June 15, 1925        BSA:          2.056 m Patient Age:    89 years          BP:           151/55 mmHg Patient Gender: M                 HR:           54 bpm. Exam Location:  [facility] Procedure: 2D Echo, Cardiac Doppler, Color Doppler and Intracardiac            Opacification Agent Indications:      Sroke  History:         Patient has prior history of Echocardiogram examinations, most                  recent 12/11/2022. CHF, CAD and Previous Myocardial Infarction,                  Prior CABG, Stroke and COPD, Arrythmias:Atrial Fibrillation and                  Bradycardia, Signs/Symptoms:Edema, Dyspnea, Fatigue and                  Shortness of Breath; Risk Factors:Hypertension, Diabetes and  Dyslipidemia.  Sonographer:     Mikki Harbor Referring Phys:  [id number] Lorretta Harp Diagnosing Phys: Julien Nordmann MD  Sonographer Comments: Image acquisition challenging due to COPD. IMPRESSIONS  1. Left ventricular ejection fraction, by estimation, is 45 to 50%. The left ventricle has mildly decreased function. The left ventricle has no regional wall motion abnormalities. Left ventricular diastolic parameters are indeterminate.  2. Right ventricular systolic function is normal. The right ventricular size is normal. There is normal pulmonary artery systolic pressure.  3. Left atrial size was moderately dilated.  4. The mitral valve is normal in structure. Mild to moderate mitral valve regurgitation. No evidence of mitral stenosis.  5. The aortic valve is normal in structure. Aortic valve regurgitation is not visualized. Aortic valve sclerosis/calcification is present, without any evidence of aortic stenosis. Aortic valve mean gradient measures 8.0 mmHg.  6. There is borderline dilatation of the ascending aorta, measuring 38 mm.  7. The inferior vena cava is normal in size with greater than 50% respiratory variability, suggesting right atrial pressure of 3 mmHg. FINDINGS  Left Ventricle: Left ventricular ejection fraction, by estimation, is 45 to 50%. The left ventricle has mildly decreased function. The left ventricle has no regional wall motion abnormalities. Definity contrast agent was given IV to delineate the left ventricular endocardial borders. The left ventricular internal cavity size was normal in  size. There is no left ventricular hypertrophy. Left ventricular diastolic parameters are indeterminate. Right Ventricle: The right ventricular size is normal. No increase in right ventricular wall thickness. Right ventricular systolic function is normal. There is normal pulmonary artery systolic pressure. The tricuspid regurgitant velocity is 1.84 m/s, and  with an assumed right atrial pressure of 3 mmHg, the estimated right ventricular systolic pressure is 16.5 mmHg. Left Atrium: Left atrial size was moderately dilated. Right Atrium: Right atrial size was normal in size. Pericardium: There is no evidence of pericardial effusion. Mitral Valve: The mitral valve is normal in structure. Mild to moderate mitral valve regurgitation. No evidence of mitral valve stenosis. MV peak gradient, 3.7 mmHg. The mean mitral valve gradient is 1.0 mmHg. Tricuspid Valve: The tricuspid valve is normal in structure. Tricuspid valve regurgitation is not demonstrated. No evidence of tricuspid stenosis. Aortic Valve: The aortic valve is normal in structure. Aortic valve regurgitation is not visualized. Aortic valve sclerosis/calcification is present, without any evidence of aortic stenosis. Aortic valve mean gradient measures 8.0 mmHg. Aortic valve peak  gradient measures 16.3 mmHg. Aortic valve area, by VTI measures 1.61 cm. Pulmonic Valve: The pulmonic valve was normal in structure. Pulmonic valve regurgitation is not visualized. No evidence of pulmonic stenosis. Aorta: The aortic root is normal in size and structure. There is borderline dilatation of the ascending aorta, measuring 38 mm. Venous: The inferior vena cava is normal in size with greater than 50% respiratory variability, suggesting right atrial pressure of 3 mmHg. IAS/Shunts: No atrial level shunt detected by color flow Doppler.  LEFT VENTRICLE PLAX 2D LVIDd:         5.30 cm   Diastology LVIDs:         3.40 cm   LV e' medial:    6.20 cm/s LV PW:         1.40 cm   LV E/e'  medial:  12.1 LV IVS:        1.30 cm   LV e' lateral:   9.46 cm/s LVOT diam:     2.10 cm   LV E/e' lateral: 7.9 LV SV:  79 LV SV Index:   39 LVOT Area:     3.46 cm  RIGHT VENTRICLE RV Basal diam:  3.60 cm RV Mid diam:    3.30 cm RV S prime:     7.83 cm/s TAPSE (M-mode): 1.8 cm LEFT ATRIUM             Index        RIGHT ATRIUM           Index LA diam:        5.40 cm 2.63 cm/m   RA Area:     17.00 cm LA Vol (A2C):   72.3 ml 35.16 ml/m  RA Volume:   40.10 ml  19.50 ml/m LA Vol (A4C):   52.6 ml 25.58 ml/m LA Biplane Vol: 62.8 ml 30.54 ml/m  AORTIC VALVE                     PULMONIC VALVE AV Area (Vmax):    1.78 cm      PV Vmax:       1.18 m/s AV Area (Vmean):   1.62 cm      PV Peak grad:  5.6 mmHg AV Area (VTI):     1.61 cm AV Vmax:           202.00 cm/s AV Vmean:          133.000 cm/s AV VTI:            0.493 m AV Peak Grad:      16.3 mmHg AV Mean Grad:      8.0 mmHg LVOT Vmax:         104.00 cm/s LVOT Vmean:        62.100 cm/s LVOT VTI:          0.229 m LVOT/AV VTI ratio: 0.46  AORTA Ao Root diam: 4.30 cm Ao Asc diam:  3.80 cm MITRAL VALVE               TRICUSPID VALVE MV Area (PHT): 2.83 cm    TR Peak grad:   13.5 mmHg MV Area VTI:   2.48 cm    TR Vmax:        184.00 cm/s MV Peak grad:  3.7 mmHg MV Mean grad:  1.0 mmHg    SHUNTS MV Vmax:       0.96 m/s    Systemic VTI:  0.23 m MV Vmean:      50.6 cm/s   Systemic Diam: 2.10 cm MV Decel Time: 268 msec MV E velocity: 74.90 cm/s MV A velocity: 95.90 cm/s MV E/A ratio:  0.78 Julien Nordmann MD Electronically signed by Julien Nordmann MD Signature Date/Time: 04/07/2023/6:01:01 PM    Final    CT HEAD WO CONTRAST ( )  Result Date: 04/07/2023 CLINICAL DATA:  Stroke, follow up EXAM: CT HEAD WITHOUT CONTRAST TECHNIQUE: Contiguous axial images were obtained from the base of the skull through the vertex without intravenous contrast. RADIATION DOSE REDUCTION: This exam was performed according to the departmental dose-optimization program which includes  automated exposure control, adjustment of the mA and/or kV according to patient size and/or use of iterative reconstruction technique. COMPARISON:  MR Brain 04/06/23, CT head 04/06/23 FINDINGS: Brain: No hemorrhage. No hydrocephalus. There are prominent subarachnoid spaces bilaterally, unchanged. There is sequela of moderate chronic microvascular ischemic change with a chronic infarct in the left corona radiata and an acute infarct in the right basal ganglia, better characterized on recent brain MRI. Vascular: No  hyperdense vessel or unexpected calcification. Skull: Normal. Negative for fracture or focal lesion. Sinuses/Orbits: No mastoid or middle ear effusion. Paranasal sinuses are clear. Bilateral lens replacement. Orbits are otherwise unremarkable. Other: None. IMPRESSION: 1. No acute intracranial hemorrhage. 2. Acute infarct in the right basal ganglia, better characterized on recent brain MRI. Electronically Signed   By: Lorenza Cambridge M.D.   On: 04/07/2023 16:08   CT ANGIO HEAD NECK W WO CM  Result Date: 04/06/2023 CLINICAL DATA:  Initial evaluation for neuro deficit, stroke. EXAM: CT ANGIOGRAPHY HEAD AND NECK WITH AND WITHOUT CONTRAST TECHNIQUE: Multidetector CT imaging of the head and neck was performed using the standard protocol during bolus administration of intravenous contrast. Multiplanar CT image reconstructions and MIPs were obtained to evaluate the vascular anatomy. Carotid stenosis measurements (when applicable) are obtained utilizing NASCET criteria, using the distal internal carotid diameter as the denominator. RADIATION DOSE REDUCTION: This exam was performed according to the departmental dose-optimization program which includes automated exposure control, adjustment of the mA and/or kV according to patient size and/or use of iterative reconstruction technique. CONTRAST:  75mL OMNIPAQUE IOHEXOL 350 MG/ML SOLN COMPARISON:  MRI and CT from earlier the same day. FINDINGS: CTA NECK FINDINGS Aortic  arch: Visualized aortic arch normal caliber with standard branch pattern. Moderate atheromatous change about the arch itself. No high-grade stenosis about the origin the great vessels. Right carotid system: Right common and internal carotid arteries are patent without dissection. Mild to moderate atheromatous change about the right carotid bulb without hemodynamically significant greater 50% stenosis. Left carotid system: Left common and internal carotid arteries are patent without dissection. Moderate atheromatous change about the left carotid bulb without hemodynamically significant greater than 50% stenosis. Vertebral arteries: Both vertebral arteries arise from subclavian arteries. No significant proximal subclavian artery stenosis. Atheromatous plaque at the origin of the right vertebral artery with associated severe stenosis (series 6, image 264). Vertebral arteries otherwise patent without significant stenosis or dissection. Skeleton: No discrete or worrisome osseous lesions. C6 and C7 vertebral bodies are ankylosed. Underlying moderate cervical spondylosis. Prior median sternotomy. Other neck: No other acute abnormality within the neck. Upper chest: Visualized upper chest demonstrates no other acute finding. Review of the MIP images confirms the above findings CTA HEAD FINDINGS Anterior circulation: Atheromatous change about the carotid siphons with associated mild diffuse narrowing on the right with more moderate stenosis on the left. A1 segments patent bilaterally. No aneurysm. Left A1 hypoplastic, accounting for the mildly diminutive left ICA is compared to the right. Normal anterior communicating complex. Anterior cerebral arteries patent without stenosis. No M1 stenosis or occlusion. No proximal MCA branch occlusion or high-grade stenosis. Distal MCA branches grossly perfused and symmetric. Diffuse small vessel atheromatous irregularity noted. Posterior circulation: Atheromatous change about the  proximal V4 segments bilaterally without hemodynamically significant stenosis. Both PICA grossly patent at their origins. Basilar patent without stenosis. Superior cerebral arteries patent bilaterally. Both PCAs primarily supplied via the basilar. Atheromatous change about the PCAs bilaterally. No significant stenosis on the left. Moderate proximal right P2 stenoses noted (series 9, image 21). Right PCA remains patent to its distal aspect. Venous sinuses: Not well assessed due to arterial timing the contrast bolus. Anatomic variants: As above.  No aneurysm. Review of the MIP images confirms the above findings IMPRESSION: 1. Negative CTA for large vessel occlusion or other emergent finding. 2. Moderate atheromatous change about the carotid siphons with associated mild to moderate stenosis, left worse than right. 3. Additional intracranial atheromatous disease with associated  moderate proximal right P2 stenoses. Diffuse small vessel atheromatous irregularity. 4. Atheromatous plaque at the origin of the right vertebral artery with associated severe stenosis. Electronically Signed   By: Rise Mu M.D.   On: 04/06/2023 19:27   MR BRAIN WO CONTRAST  Result Date: 04/06/2023 CLINICAL DATA:  Generalized weakness, stroke suspected EXAM: MRI HEAD WITHOUT CONTRAST TECHNIQUE: Multiplanar, multiecho pulse sequences of the brain and surrounding structures were obtained without intravenous contrast. COMPARISON:  12/09/2022 MRI head, correlation is also made with same day CT head FINDINGS: Brain: Restricted diffusion with ADC correlate in the right posterior limb of the internal capsule and corona radiata (series 8, images 26), which measures up to 15 x 6 x 9 mm (AP x TR x CC) this is associated with increased T2 hyperintense signal. No acute hemorrhage, mass, mass effect, or midline shift. No hydrocephalus or extra-axial collection. Normal craniocervical junction. No hemosiderin deposition to suggest remote  hemorrhage. Sequela of prior infarct in the left posterior limb of the internal capsule and corona radiata. Vascular: Normal arterial flow voids. Skull and upper cervical spine: Normal marrow signal. Sinuses/Orbits: Clear paranasal sinuses. No acute finding in the orbits. Status post bilateral lens replacements. Other: Trace fluid in right mastoid air cells. IMPRESSION: Acute infarct in the right posterior limb of the internal capsule and corona radiata. These results were called by telephone at the time of interpretation on 04/06/2023 at 4:39 pm to provider [facility] , who verbally acknowledged these results. Electronically Signed   By: Wiliam Ke M.D.   On: 04/06/2023 16:42   CT HEAD WO CONTRAST ( )  Result Date: 04/06/2023 CLINICAL DATA:  Neuro deficit, acute, stroke suspected. Additional history provided: Weakness, prior stroke. EXAM: CT HEAD WITHOUT CONTRAST TECHNIQUE: Contiguous axial images were obtained from the base of the skull through the vertex without intravenous contrast. RADIATION DOSE REDUCTION: This exam was performed according to the departmental dose-optimization program which includes automated exposure control, adjustment of the mA and/or kV according to patient size and/or use of iterative reconstruction technique. COMPARISON:  Non-contrast head CT 12/10/2022. Brain MRI 12/09/2022. FINDINGS: Brain: Known chronic infarct within the left corona radiata/posterior limb of left internal capsule. There is no acute intracranial hemorrhage. No demarcated cortical infarct. No extra-axial fluid collection. No evidence of an intracranial mass. No midline shift. Vascular: No hyperdense vessel. Atherosclerotic calcifications. Skull: No fracture or aggressive osseous lesion. Sinuses/Orbits: No mass or acute finding within the imaged orbits. No significant paranasal sinus disease at the imaged levels. IMPRESSION: 1.  No evidence of an acute intracranial abnormality. 2. Known chronic infarct within the  left corona radiata/posterior limb of left internal capsule. Electronically Signed   By: Jackey Loge D.O.   On: 04/06/2023 13:09    Assessment/Plan 1. Permanent atrial fibrillation (HCC) ***  2. Chronic anticoagulation ***  3. Epistaxis ***  4. Type 2 diabetes mellitus with diabetic peripheral angiopathy without gangrene, with long-term current use of insulin (HCC) ***  5. Essential hypertension ***  6. Restless leg ***  7. Neuropathy ***  8. Epistaxis, recurrent ***    Jessica K. Biagio Borg [facility] [phone]

## 2023-04-25 NOTE — Progress Notes (Unsigned)
Location:  Other [place].  Nursing Home Room Number: [facility] 107A Place of Service:  SNF [phone]) Abbey Chatters, NP  PCP: Bosie Clos, MD  Patient Care Team: Bosie Clos, MD as PCP - General (Family Medicine) Mariah Milling Tollie Pizza, MD as PCP - Cardiology (Cardiology) Tedd Sias Marlana Salvage, MD as Physician Assistant (Endocrinology) Gertha Calkin, MD as Referring Physician (Internal Medicine)  Extended Emergency Contact Information Primary Emergency Contact: [facility] Phone: [phone] Relation: Daughter Secondary Emergency Contact: [facility] Phone: [phone] Relation: Son  Goals of care: Advanced Directive information    04/25/2023    3:22 PM  Advanced Directives  Does Patient Have a Medical Advance Directive? Yes  Type of Advance Directive Healthcare Power of Attorney  Does patient want to make changes to medical advance directive? No - Patient declined  Copy of Healthcare Power of Attorney in Chart? Yes - validated most recent copy scanned in chart (See row information)     Chief Complaint  Patient presents with   Discharge Note    Discharge.     HPI:  Pt is a 89 y.o. male seen today for discharge home from SNF medical history significant of stroke with right-sided weakness 11/2022, HTN, HLD, DM, CAD, CABG, dCHF, hypothyroidism, CKD-3A, who went to ED with generalized weakness and mild left facial droop.  Patient diagnosed with acute CVA on MRI.  Echocardiogram also showed EF of 45% and new onset atrial fibrillation therefore cardiology team consulted.  Neurology recommending DAPT but patient was started on Eliquis due to A-fib. He was dc to SNF for short term rehab.     Pts plans to discharge home with home health, family and caregiver support on 6/6  Pt reports he has been having ongoing pain to his toes at night, burning and sheets cause pain. No swelling, redness or warmth noted. He has hx of neuropathy but not on any  medications.  He has been able to manage his diabetes at home. Has endocrinologist that he seens monthly.   He has home health through the [place] and they plan on restarting once discharged.   He has AM caregivers and daughter looking for more help in the evenings.   He has been doing well on requip.   Ongoing nose bleeds- using humidifier which daughter feels like helps.  Past Medical History:  Diagnosis Date   Acute respiratory failure with hypoxia (HCC) 03/21/2016   CAD (coronary artery disease)    a. s/p CABG;  b. 07/2012 Cath: 3VD including 80 dLCX (med Rx), 4/4 patent grafts.   Chronic diastolic CHF (congestive heart failure) (HCC)    a. 07/2012 Echo: EF 55-60%, no rwma, Gr 1 DD, mild MR;  04/2014 Echo: EF 55-60%, no rwma, mild MR, mildly dil LA.   Diabetes mellitus, type 2 (HCC)    HTN (hypertension)    Hyperlipidemia    Hypothyroidism    Lower extremity edema    a. 03/2014 amlodipine d/c'd.   Spinal stenosis    Past Surgical History:  Procedure Laterality Date   APPENDECTOMY     CARDIAC CATHETERIZATION  07/2012   [facility]/no stents   CHOLECYSTECTOMY     CORNEAL TRANSPLANT     CORONARY ARTERY BYPASS GRAFT  1994   HEMORRHOID SURGERY     LEFT HEART CATH AND CORS/GRAFTS ANGIOGRAPHY N/A 12/17/2019   Procedure: LEFT HEART CATH AND CORS/GRAFTS ANGIOGRAPHY;  Surgeon: Yvonne Kendall, MD;  Location: [facility] INVASIVE CV LAB;  Service: Cardiovascular;  Laterality: N/A;  PROSTATE BIOPSY     TOE SURGERY     ingrown toe nail    Allergies  Allergen Reactions   Farxiga [Dapagliflozin] Rash    Genital rash    Outpatient Encounter Medications as of 04/25/2023  Medication Sig   apixaban (ELIQUIS) 5 MG TABS tablet Take 1 tablet (5 mg total) by mouth 2 (two) times daily.   atorvastatin (LIPITOR) 80 MG tablet Take 1 tablet (80 mg total) by mouth daily.   fenofibrate 54 MG tablet Take 1 tablet (54 mg total) by mouth daily.   Infant [employer] [facility]) OINT Apply 1 application  topically  daily as needed. Apply to peri area topically every shift for skin protectant   insulin aspart (NOVOLOG) 100 UNIT/ML injection Inject 20 units once daily in the afternoon. Inject 25 units once daily in the eveing. Inject 25 units once daily.   insulin glargine (LANTUS) 100 UNIT/ML injection Inject 40 Units into the skin daily.   levothyroxine (SYNTHROID) 50 MCG tablet TAKE 1 TABLET BY MOUTH ONCE DAILY BEFORE BREAKFAST   losartan (COZAAR) 100 MG tablet Take 100 mg by mouth daily.   nitroGLYCERIN (NITROSTAT) 0.4 MG SL tablet Place 0.4 mg under the tongue every 5 (five) minutes as needed for chest pain.   omeprazole (PRILOSEC) 20 MG capsule Take 1 capsule by mouth once daily   oxymetazoline (AFRIN) 0.05 % nasal spray Place 2 sprays into both nostrils once for nose bleed, seek medical attention if bleeding persists after 2 sprays, do not repeat dose   potassium chloride (KLOR-CON) 10 MEQ tablet Take 10 mEq by mouth daily.   rOPINIRole (REQUIP) 0.5 MG tablet Take 1 tablet (0.5 mg total) by mouth 2 (two) times daily.   torsemide (DEMADEX) 20 MG tablet Take 20 mg by mouth daily.   Zinc Oxide (TRIPLE PASTE) 12.8 % ointment Apply 1 Application topically as needed for irritation. Every shift.   No facility-administered encounter medications on file as of 04/25/2023.    Review of Systems  Constitutional:  Negative for activity change, appetite change, fatigue and unexpected weight change.  HENT:  Negative for congestion and hearing loss.   Eyes: Negative.   Respiratory:  Negative for cough and shortness of breath.   Cardiovascular:  Negative for chest pain, palpitations and leg swelling.  Gastrointestinal:  Negative for abdominal pain, constipation and diarrhea.  Genitourinary:  Negative for difficulty urinating and dysuria.  Musculoskeletal:  Negative for arthralgias and myalgias.  Skin:  Negative for color change and wound.  Neurological:  Negative for dizziness and weakness.  Psychiatric/Behavioral:   Negative for agitation, behavioral problems and confusion.     Immunization History  Administered Date(s) Administered   COVID-19, mRNA, vaccine(Comirnaty)12 years and older 04/15/2020   Fluad Quad(high Dose 65+) 11/07/2019, 09/30/2021, 09/23/2022   Influenza Split 08/23/2011   Influenza, High Dose Seasonal PF 09/01/2015, 08/08/2017   Influenza,inj,Quad PF,6+ Mos 08/05/2013, 08/04/2014, 08/18/2016   Influenza-Unspecified 09/23/2022   PFIZER(Purple Top)SARS-COV-2 Vaccination 02/24/2020, 03/18/2020   PNEUMOCOCCAL CONJUGATE-20 04/13/2023   Pneumococcal Conjugate-13 03/24/2015   Pneumococcal Polysaccharide-23 10/18/2014   Td (Adult),unspecified 04/01/2004   Tdap 08/04/2014   Zoster, Live 08/04/2014   Pertinent  Health Maintenance Due  Topic Date Due   OPHTHALMOLOGY EXAM  11/17/2020   FOOT EXAM  09/22/2021   INFLUENZA VACCINE  06/22/2023   HEMOGLOBIN A1C  10/08/2023      01/25/2021    1:39 PM 01/05/2022    2:12 PM 03/02/2022    3:45 PM 11/10/2022  8:25 AM 01/13/2023   10:23 AM  Fall Risk  Falls in the past year? 0 0 0 1 0  Was there an injury with Fall? 0  0 0 0  Fall Risk Category Calculator 0  0 1 0  Fall Risk Category (Retired) Low  Low Low   (RETIRED) Patient Fall Risk Level   Low fall risk Low fall risk   Patient at Risk for Falls Due to   No Fall Risks No Fall Risks No Fall Risks  Fall risk Follow up   Falls evaluation completed  Falls evaluation completed   Functional Status Survey:    Vitals:   04/25/23 1505 04/25/23 1528  BP: (!) 153/85 (!) 154/75  Pulse: 70   Resp: 18   Temp: 97.6 F (36.4 C)   SpO2: 93%   Weight: 207 lb 8 oz (94.1 kg)   Height: 5\' 7"  (1.702 m)    Body mass index is 32.5 kg/m. Physical Exam Constitutional:      General: He is not in acute distress.    Appearance: He is well-developed. He is not diaphoretic.  HENT:     Head: Normocephalic and atraumatic.     Right Ear: External ear normal.     Left Ear: External ear normal.      Mouth/Throat:     Pharynx: No oropharyngeal exudate.  Eyes:     Conjunctiva/sclera: Conjunctivae normal.     Pupils: Pupils are equal, round, and reactive to light.  Cardiovascular:     Rate and Rhythm: Normal rate. Rhythm irregular.     Heart sounds: Normal heart sounds.  Pulmonary:     Effort: Pulmonary effort is normal.     Breath sounds: Normal breath sounds.  Abdominal:     General: Bowel sounds are normal.     Palpations: Abdomen is soft.  Musculoskeletal:        General: No tenderness.     Cervical back: Normal range of motion and neck supple.     Right lower leg: No edema.     Left lower leg: No edema.  Skin:    General: Skin is warm and dry.  Neurological:     Mental Status: He is alert and oriented to person, place, and time.     Labs reviewed: Recent Labs    06/01/22 1532 06/08/22 1338 12/12/22 0527 12/13/22 0523 04/09/23 0423 04/10/23 0418 04/10/23 1452 04/11/23 0528 04/13/23 0000 04/20/23 0000  NA 139   < > 140   < > 137 139  --  140 138 140  K 4.7   < > 3.2*   < > 3.6 3.9  --  3.8 3.7 3.8  CL 100   < > 107   < > 109 111  --  111 107 107  CO2 24   < > 25   < > 21* 20*  --  21* 23* 27*  GLUCOSE 191*   < > 136*   < > 157* 159*  --  189*  --   --   BUN 20   < > 15   < > 16 23  --  20 13 19   CREATININE 1.21   < > 0.96   < > 1.17 1.64*   < > 1.29* 1.1 1.2  CALCIUM 9.3   < > 8.5*   < > 8.6* 8.9  --  9.2 8.4* 9.0  MG  --   --  1.7  --   --  2.0  --  1.9  --   --   PHOS 3.4  --   --   --   --   --   --   --   --   --    < > = values in this interval not displayed.   Recent Labs    06/01/22 1532 04/06/23 1206  AST  --  30  ALT  --  15  ALKPHOS  --  39  BILITOT  --  1.0  PROT  --  6.3*  ALBUMIN 3.9 3.3*   Recent Labs    04/08/23 0408 04/09/23 0423 04/10/23 0418 04/13/23 0000  WBC 5.5 6.9 7.3 4.8  NEUTROABS 2.1 3.3 3.0 1,853.00  HGB 11.9* 12.5* 12.4* 11.3*  HCT 35.5* 37.9* 37.0* 34*  MCV 89.6 91.3 90.9  --   PLT 232 227 240  --    Lab  Results  Component Value Date   TSH 1.962 04/06/2023   Lab Results  Component Value Date   HGBA1C 6.9 (H) 04/07/2023   Lab Results  Component Value Date   CHOL 126 04/07/2023   HDL 28 (L) 04/07/2023   LDLCALC 70 04/07/2023   TRIG 138 04/07/2023   CHOLHDL 4.5 04/07/2023    Significant Diagnostic Results in last 30 days:  ECHOCARDIOGRAM COMPLETE  Result Date: 04/07/2023    ECHOCARDIOGRAM REPORT   Patient Name:   AMORION OBERLIN Date of Exam: 04/07/2023 Medical Rec #:  [id number]         Height:       67.0 in Accession #:    [id number]        Weight:       208.0 lb Date of Birth:  05-27-1925        BSA:          2.056 m Patient Age:    89 years          BP:           151/55 mmHg Patient Gender: M                 HR:           54 bpm. Exam Location:  [facility] Procedure: 2D Echo, Cardiac Doppler, Color Doppler and Intracardiac            Opacification Agent Indications:     Sroke  History:         Patient has prior history of Echocardiogram examinations, most                  recent 12/11/2022. CHF, CAD and Previous Myocardial Infarction,                  Prior CABG, Stroke and COPD, Arrythmias:Atrial Fibrillation and                  Bradycardia, Signs/Symptoms:Edema, Dyspnea, Fatigue and                  Shortness of Breath; Risk Factors:Hypertension, Diabetes and                  Dyslipidemia.  Sonographer:     Mikki Harbor Referring Phys:  [id number] Lorretta Harp Diagnosing Phys: Julien Nordmann MD  Sonographer Comments: Image acquisition challenging due to COPD. IMPRESSIONS  1. Left ventricular ejection fraction, by estimation, is 45 to 50%. The left ventricle has mildly decreased function. The left ventricle has no regional wall motion abnormalities. Left ventricular diastolic parameters are  indeterminate.  2. Right ventricular systolic function is normal. The right ventricular size is normal. There is normal pulmonary artery systolic pressure.  3. Left atrial size was moderately dilated.  4. The mitral  valve is normal in structure. Mild to moderate mitral valve regurgitation. No evidence of mitral stenosis.  5. The aortic valve is normal in structure. Aortic valve regurgitation is not visualized. Aortic valve sclerosis/calcification is present, without any evidence of aortic stenosis. Aortic valve mean gradient measures 8.0 mmHg.  6. There is borderline dilatation of the ascending aorta, measuring 38 mm.  7. The inferior vena cava is normal in size with greater than 50% respiratory variability, suggesting right atrial pressure of 3 mmHg. FINDINGS  Left Ventricle: Left ventricular ejection fraction, by estimation, is 45 to 50%. The left ventricle has mildly decreased function. The left ventricle has no regional wall motion abnormalities. Definity contrast agent was given IV to delineate the left ventricular endocardial borders. The left ventricular internal cavity size was normal in size. There is no left ventricular hypertrophy. Left ventricular diastolic parameters are indeterminate. Right Ventricle: The right ventricular size is normal. No increase in right ventricular wall thickness. Right ventricular systolic function is normal. There is normal pulmonary artery systolic pressure. The tricuspid regurgitant velocity is 1.84 m/s, and  with an assumed right atrial pressure of 3 mmHg, the estimated right ventricular systolic pressure is 16.5 mmHg. Left Atrium: Left atrial size was moderately dilated. Right Atrium: Right atrial size was normal in size. Pericardium: There is no evidence of pericardial effusion. Mitral Valve: The mitral valve is normal in structure. Mild to moderate mitral valve regurgitation. No evidence of mitral valve stenosis. MV peak gradient, 3.7 mmHg. The mean mitral valve gradient is 1.0 mmHg. Tricuspid Valve: The tricuspid valve is normal in structure. Tricuspid valve regurgitation is not demonstrated. No evidence of tricuspid stenosis. Aortic Valve: The aortic valve is normal in structure.  Aortic valve regurgitation is not visualized. Aortic valve sclerosis/calcification is present, without any evidence of aortic stenosis. Aortic valve mean gradient measures 8.0 mmHg. Aortic valve peak  gradient measures 16.3 mmHg. Aortic valve area, by VTI measures 1.61 cm. Pulmonic Valve: The pulmonic valve was normal in structure. Pulmonic valve regurgitation is not visualized. No evidence of pulmonic stenosis. Aorta: The aortic root is normal in size and structure. There is borderline dilatation of the ascending aorta, measuring 38 mm. Venous: The inferior vena cava is normal in size with greater than 50% respiratory variability, suggesting right atrial pressure of 3 mmHg. IAS/Shunts: No atrial level shunt detected by color flow Doppler.  LEFT VENTRICLE PLAX 2D LVIDd:         5.30 cm   Diastology LVIDs:         3.40 cm   LV e' medial:    6.20 cm/s LV PW:         1.40 cm   LV E/e' medial:  12.1 LV IVS:        1.30 cm   LV e' lateral:   9.46 cm/s LVOT diam:     2.10 cm   LV E/e' lateral: 7.9 LV SV:         79 LV SV Index:   39 LVOT Area:     3.46 cm  RIGHT VENTRICLE RV Basal diam:  3.60 cm RV Mid diam:    3.30 cm RV S prime:     7.83 cm/s TAPSE (M-mode): 1.8 cm LEFT ATRIUM  Index        RIGHT ATRIUM           Index LA diam:        5.40 cm 2.63 cm/m   RA Area:     17.00 cm LA Vol (A2C):   72.3 ml 35.16 ml/m  RA Volume:   40.10 ml  19.50 ml/m LA Vol (A4C):   52.6 ml 25.58 ml/m LA Biplane Vol: 62.8 ml 30.54 ml/m  AORTIC VALVE                     PULMONIC VALVE AV Area (Vmax):    1.78 cm      PV Vmax:       1.18 m/s AV Area (Vmean):   1.62 cm      PV Peak grad:  5.6 mmHg AV Area (VTI):     1.61 cm AV Vmax:           202.00 cm/s AV Vmean:          133.000 cm/s AV VTI:            0.493 m AV Peak Grad:      16.3 mmHg AV Mean Grad:      8.0 mmHg LVOT Vmax:         104.00 cm/s LVOT Vmean:        62.100 cm/s LVOT VTI:          0.229 m LVOT/AV VTI ratio: 0.46  AORTA Ao Root diam: 4.30 cm Ao Asc diam:   3.80 cm MITRAL VALVE               TRICUSPID VALVE MV Area (PHT): 2.83 cm    TR Peak grad:   13.5 mmHg MV Area VTI:   2.48 cm    TR Vmax:        184.00 cm/s MV Peak grad:  3.7 mmHg MV Mean grad:  1.0 mmHg    SHUNTS MV Vmax:       0.96 m/s    Systemic VTI:  0.23 m MV Vmean:      50.6 cm/s   Systemic Diam: 2.10 cm MV Decel Time: 268 msec MV E velocity: 74.90 cm/s MV A velocity: 95.90 cm/s MV E/A ratio:  0.78 Julien Nordmann MD Electronically signed by Julien Nordmann MD Signature Date/Time: 04/07/2023/6:01:01 PM    Final    CT HEAD WO CONTRAST ( )  Result Date: 04/07/2023 CLINICAL DATA:  Stroke, follow up EXAM: CT HEAD WITHOUT CONTRAST TECHNIQUE: Contiguous axial images were obtained from the base of the skull through the vertex without intravenous contrast. RADIATION DOSE REDUCTION: This exam was performed according to the departmental dose-optimization program which includes automated exposure control, adjustment of the mA and/or kV according to patient size and/or use of iterative reconstruction technique. COMPARISON:  MR Brain 04/06/23, CT head 04/06/23 FINDINGS: Brain: No hemorrhage. No hydrocephalus. There are prominent subarachnoid spaces bilaterally, unchanged. There is sequela of moderate chronic microvascular ischemic change with a chronic infarct in the left corona radiata and an acute infarct in the right basal ganglia, better characterized on recent brain MRI. Vascular: No hyperdense vessel or unexpected calcification. Skull: Normal. Negative for fracture or focal lesion. Sinuses/Orbits: No mastoid or middle ear effusion. Paranasal sinuses are clear. Bilateral lens replacement. Orbits are otherwise unremarkable. Other: None. IMPRESSION: 1. No acute intracranial hemorrhage. 2. Acute infarct in the right basal ganglia, better characterized on recent brain MRI. Electronically Signed   By: Sandria Senter  Celine Mans M.D.   On: 04/07/2023 16:08   CT ANGIO HEAD NECK W WO CM  Result Date: 04/06/2023 CLINICAL DATA:   Initial evaluation for neuro deficit, stroke. EXAM: CT ANGIOGRAPHY HEAD AND NECK WITH AND WITHOUT CONTRAST TECHNIQUE: Multidetector CT imaging of the head and neck was performed using the standard protocol during bolus administration of intravenous contrast. Multiplanar CT image reconstructions and MIPs were obtained to evaluate the vascular anatomy. Carotid stenosis measurements (when applicable) are obtained utilizing NASCET criteria, using the distal internal carotid diameter as the denominator. RADIATION DOSE REDUCTION: This exam was performed according to the departmental dose-optimization program which includes automated exposure control, adjustment of the mA and/or kV according to patient size and/or use of iterative reconstruction technique. CONTRAST:  75mL OMNIPAQUE IOHEXOL 350 MG/ML SOLN COMPARISON:  MRI and CT from earlier the same day. FINDINGS: CTA NECK FINDINGS Aortic arch: Visualized aortic arch normal caliber with standard branch pattern. Moderate atheromatous change about the arch itself. No high-grade stenosis about the origin the great vessels. Right carotid system: Right common and internal carotid arteries are patent without dissection. Mild to moderate atheromatous change about the right carotid bulb without hemodynamically significant greater 50% stenosis. Left carotid system: Left common and internal carotid arteries are patent without dissection. Moderate atheromatous change about the left carotid bulb without hemodynamically significant greater than 50% stenosis. Vertebral arteries: Both vertebral arteries arise from subclavian arteries. No significant proximal subclavian artery stenosis. Atheromatous plaque at the origin of the right vertebral artery with associated severe stenosis (series 6, image 264). Vertebral arteries otherwise patent without significant stenosis or dissection. Skeleton: No discrete or worrisome osseous lesions. C6 and C7 vertebral bodies are ankylosed. Underlying  moderate cervical spondylosis. Prior median sternotomy. Other neck: No other acute abnormality within the neck. Upper chest: Visualized upper chest demonstrates no other acute finding. Review of the MIP images confirms the above findings CTA HEAD FINDINGS Anterior circulation: Atheromatous change about the carotid siphons with associated mild diffuse narrowing on the right with more moderate stenosis on the left. A1 segments patent bilaterally. No aneurysm. Left A1 hypoplastic, accounting for the mildly diminutive left ICA is compared to the right. Normal anterior communicating complex. Anterior cerebral arteries patent without stenosis. No M1 stenosis or occlusion. No proximal MCA branch occlusion or high-grade stenosis. Distal MCA branches grossly perfused and symmetric. Diffuse small vessel atheromatous irregularity noted. Posterior circulation: Atheromatous change about the proximal V4 segments bilaterally without hemodynamically significant stenosis. Both PICA grossly patent at their origins. Basilar patent without stenosis. Superior cerebral arteries patent bilaterally. Both PCAs primarily supplied via the basilar. Atheromatous change about the PCAs bilaterally. No significant stenosis on the left. Moderate proximal right P2 stenoses noted (series 9, image 21). Right PCA remains patent to its distal aspect. Venous sinuses: Not well assessed due to arterial timing the contrast bolus. Anatomic variants: As above.  No aneurysm. Review of the MIP images confirms the above findings IMPRESSION: 1. Negative CTA for large vessel occlusion or other emergent finding. 2. Moderate atheromatous change about the carotid siphons with associated mild to moderate stenosis, left worse than right. 3. Additional intracranial atheromatous disease with associated moderate proximal right P2 stenoses. Diffuse small vessel atheromatous irregularity. 4. Atheromatous plaque at the origin of the right vertebral artery with associated  severe stenosis. Electronically Signed   By: Rise Mu M.D.   On: 04/06/2023 19:27   MR BRAIN WO CONTRAST  Result Date: 04/06/2023 CLINICAL DATA:  Generalized weakness, stroke suspected EXAM: MRI HEAD  WITHOUT CONTRAST TECHNIQUE: Multiplanar, multiecho pulse sequences of the brain and surrounding structures were obtained without intravenous contrast. COMPARISON:  12/09/2022 MRI head, correlation is also made with same day CT head FINDINGS: Brain: Restricted diffusion with ADC correlate in the right posterior limb of the internal capsule and corona radiata (series 8, images 26), which measures up to 15 x 6 x 9 mm (AP x TR x CC) this is associated with increased T2 hyperintense signal. No acute hemorrhage, mass, mass effect, or midline shift. No hydrocephalus or extra-axial collection. Normal craniocervical junction. No hemosiderin deposition to suggest remote hemorrhage. Sequela of prior infarct in the left posterior limb of the internal capsule and corona radiata. Vascular: Normal arterial flow voids. Skull and upper cervical spine: Normal marrow signal. Sinuses/Orbits: Clear paranasal sinuses. No acute finding in the orbits. Status post bilateral lens replacements. Other: Trace fluid in right mastoid air cells. IMPRESSION: Acute infarct in the right posterior limb of the internal capsule and corona radiata. These results were called by telephone at the time of interpretation on 04/06/2023 at 4:39 pm to provider [facility] , who verbally acknowledged these results. Electronically Signed   By: Wiliam Ke M.D.   On: 04/06/2023 16:42   CT HEAD WO CONTRAST ( )  Result Date: 04/06/2023 CLINICAL DATA:  Neuro deficit, acute, stroke suspected. Additional history provided: Weakness, prior stroke. EXAM: CT HEAD WITHOUT CONTRAST TECHNIQUE: Contiguous axial images were obtained from the base of the skull through the vertex without intravenous contrast. RADIATION DOSE REDUCTION: This exam was performed  according to the departmental dose-optimization program which includes automated exposure control, adjustment of the mA and/or kV according to patient size and/or use of iterative reconstruction technique. COMPARISON:  Non-contrast head CT 12/10/2022. Brain MRI 12/09/2022. FINDINGS: Brain: Known chronic infarct within the left corona radiata/posterior limb of left internal capsule. There is no acute intracranial hemorrhage. No demarcated cortical infarct. No extra-axial fluid collection. No evidence of an intracranial mass. No midline shift. Vascular: No hyperdense vessel. Atherosclerotic calcifications. Skull: No fracture or aggressive osseous lesion. Sinuses/Orbits: No mass or acute finding within the imaged orbits. No significant paranasal sinus disease at the imaged levels. IMPRESSION: 1.  No evidence of an acute intracranial abnormality. 2. 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 Electronically Signed   By: Jackey Loge D.O.   On: 04/06/2023 13:09    Assessment/Plan 1. Permanent atrial fibrillation (HCC) Rate controlled  - apixaban (ELIQUIS) 5 MG TABS tablet; Take 1 tablet (5 mg total) by mouth 2 (two) times daily.  Dispense: 60 tablet; Refill: 0  2. Chronic anticoagulation -continues on eliquis, has nose bleeds but this are chronic and no increase in symptoms stince being on eliquis.   4. Type 2 diabetes mellitus with diabetic peripheral angiopathy without gangrene, with long-term current use of insulin (HCC) Well controlled, followed by endocrinology - insulin aspart (NOVOLOG) 100 UNIT/ML injection; Inject 20 units once daily in the afternoon. Inject 25 units once daily in the eveing. Inject 25 units once daily.  Dispense: 10 mL; Refill: 0 - insulin glargine (LANTUS) 100 UNIT/ML injection; Inject 0.4 mLs (40 Units total) into the skin daily.  Dispense: 10 mL; Refill: 0  5. Essential hypertension Slightly elevated at this time, will have him  follow up with PCP for bp management.  - losartan (COZAAR) 100 MG tablet; Take 1 tablet (100 mg total) by mouth daily.  Dispense: 30 tablet; Refill: 0  6. Restless leg Has improved on requip,  requesting Rx on dishcarge  - rOPINIRole (REQUIP) 0.5 MG tablet; Take 1 tablet (0.5 mg total) by mouth 2 (two) times daily.  Dispense: 60 tablet; Refill: 0  7. Neuropathy Ongoing neuropathy, worse at night. Will add gabapentin for symptom management.  - gabapentin (NEURONTIN) 100 MG capsule; Take 1 capsule (100 mg total) by mouth at bedtime.  Dispense: 30 capsule; Refill: 0  8. Epistaxis, recurrent -ongoing, avoid blowing nose -continue humidifier  - oxymetazoline (AFRIN) 0.05 % nasal spray; Place 2 sprays into both nostrils once for nose bleed, seek medical attention if bleeding persists after 2 sprays, do not repeat dose  Dispense: 6 mL; Refill: 0  9. Mixed hyperlipidemia - atorvastatin (LIPITOR) 80 MG tablet; Take 1 tablet (80 mg total) by mouth daily.  Dispense: 30 tablet; Refill: 0 - fenofibrate 54 MG tablet; Take 1 tablet (54 mg total) by mouth daily.  Dispense: 30 tablet; Refill: 0  10. Acquired hypothyroidism - tsh at goal, levothyroxine (SYNTHROID) 50 MCG tablet; Take 1 tablet (50 mcg total) by mouth daily before breakfast.  Dispense: 30 tablet; Refill: 0  11. Chronic combined systolic and diastolic CHF (congestive heart failure) (HCC) Euvolemic at this time, without worsening shortness of breath.  - potassium chloride (KLOR-CON) 10 MEQ tablet; Take 1 tablet (10 mEq total) by mouth daily.  Dispense: 30 tablet; Refill: 0 - torsemide (DEMADEX) 20 MG tablet; Take 1 tablet (20 mg total) by mouth daily.  Dispense: 30 tablet; Refill: 0  12. Gastroesophageal reflux disease, unspecified whether esophagitis present -controlled on omeprazole. - omeprazole (PRILOSEC) 20 MG capsule; Take 1 capsule (20 mg total) by mouth daily.  Dispense: 30 capsule; Refill: 0  13. 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  - nitroGLYCERIN (NITROSTAT) 0.4 MG SL tablet; Place 1 tablet (0.4 mg total) under the tongue every 5 (five) minutes as needed for chest pain.  Dispense: 30 tablet; Refill: 0  pt is stable for discharge-will need PT/OT per home health.already had DME at home. Rx sent via epic for 1 month supply,  will need to follow up with PCP within 2 weeks.    Janene Harvey. Biagio Borg [facility] [phone]

## 2023-04-26 MED ORDER — LEVOTHYROXINE SODIUM 50 MCG PO TABS
50.0000 ug | ORAL_TABLET | Freq: Every day | ORAL | 0 refills | Status: AC
Start: 2023-04-26 — End: ?

## 2023-04-26 MED ORDER — ATORVASTATIN CALCIUM 80 MG PO TABS
80.0000 mg | ORAL_TABLET | Freq: Every day | ORAL | 0 refills | Status: AC
Start: 2023-04-26 — End: ?

## 2023-04-26 MED ORDER — ROPINIROLE HCL 0.5 MG PO TABS
0.5000 mg | ORAL_TABLET | Freq: Two times a day (BID) | ORAL | 0 refills | Status: AC
Start: 2023-04-26 — End: ?

## 2023-04-26 MED ORDER — OXYMETAZOLINE HCL 0.05 % NA SOLN
NASAL | 0 refills | Status: AC
Start: 2023-04-26 — End: ?

## 2023-04-26 MED ORDER — GABAPENTIN 100 MG PO CAPS
100.0000 mg | ORAL_CAPSULE | Freq: Every day | ORAL | 0 refills | Status: DC
Start: 2023-04-26 — End: 2023-06-02

## 2023-04-26 MED ORDER — NITROGLYCERIN 0.4 MG SL SUBL
0.4000 mg | SUBLINGUAL_TABLET | SUBLINGUAL | 0 refills | Status: DC | PRN
Start: 2023-04-26 — End: 2023-10-03

## 2023-04-26 MED ORDER — TORSEMIDE 20 MG PO TABS
20.0000 mg | ORAL_TABLET | Freq: Every day | ORAL | 0 refills | Status: DC
Start: 2023-04-26 — End: 2023-06-02

## 2023-04-26 MED ORDER — LOSARTAN POTASSIUM 100 MG PO TABS
100.0000 mg | ORAL_TABLET | Freq: Every day | ORAL | 0 refills | Status: AC
Start: 2023-04-26 — End: ?

## 2023-04-26 MED ORDER — APIXABAN 5 MG PO TABS: 5.0000 mg | ORAL_TABLET | Freq: Two times a day (BID) | ORAL | 0 refills | Status: DC

## 2023-04-26 MED ORDER — POTASSIUM CHLORIDE ER 10 MEQ PO TBCR: 10.0000 meq | EXTENDED_RELEASE_TABLET | Freq: Every day | ORAL | 0 refills | Status: DC

## 2023-04-26 MED ORDER — INSULIN ASPART 100 UNIT/ML IJ SOLN
INTRAMUSCULAR | 0 refills | Status: AC
Start: 2023-04-26 — End: ?

## 2023-04-26 MED ORDER — INSULIN GLARGINE 100 UNIT/ML ~~LOC~~ SOLN
40.0000 [IU] | Freq: Every day | SUBCUTANEOUS | 0 refills | Status: AC
Start: 2023-04-26 — End: ?

## 2023-04-26 MED ORDER — OMEPRAZOLE 20 MG PO CPDR
20.0000 mg | DELAYED_RELEASE_CAPSULE | Freq: Every day | ORAL | 0 refills | Status: AC
Start: 2023-04-26 — End: ?

## 2023-04-26 MED ORDER — FENOFIBRATE 54 MG PO TABS: 54.0000 mg | ORAL_TABLET | Freq: Every day | ORAL | 0 refills | Status: AC

## 2023-04-28 DIAGNOSIS — I69331 Monoplegia of upper limb following cerebral infarction affecting right dominant side: Secondary | ICD-10-CM | POA: Diagnosis not present

## 2023-04-28 DIAGNOSIS — Z993 Dependence on wheelchair: Secondary | ICD-10-CM | POA: Diagnosis not present

## 2023-05-03 ENCOUNTER — Telehealth: Payer: Self-pay | Admitting: Cardiovascular Disease

## 2023-05-03 DIAGNOSIS — Z79899 Other long term (current) drug therapy: Secondary | ICD-10-CM

## 2023-05-03 NOTE — Telephone Encounter (Signed)
Patient's daughter is calling to also get the okay from Dr. Mariah Milling to change this medication that was changed with the [place]. She states that she would feel more comfortable if Dr. Mariah Milling okay'd this change, as well. She would also like to note that the patient's gums have bled the last two times he has brushed his teeth. Requesting return call.

## 2023-05-03 NOTE — Telephone Encounter (Signed)
[facility] called stating that they are decreasing Joseph [name] Apixaban to 2.5 mg twice daily from 5 mg twice daily due to patient's creatine being elevated. [name] creatine was 1.5 on 05/02/23. [facility] is [facility]. Mariah Milling could order a CBC, serum creatine, AST and ALT at his next appointment on 06/02/23.  Will forward to Dr. Mariah Milling.

## 2023-05-03 NOTE — Telephone Encounter (Signed)
Caller from [facility] wants call back to discuss patient's lab work.

## 2023-05-04 DIAGNOSIS — E261 Secondary hyperaldosteronism: Secondary | ICD-10-CM | POA: Diagnosis not present

## 2023-05-04 DIAGNOSIS — N1831 Chronic kidney disease, stage 3a: Secondary | ICD-10-CM | POA: Diagnosis not present

## 2023-05-04 DIAGNOSIS — I130 Hypertensive heart and chronic kidney disease with heart failure and stage 1 through stage 4 chronic kidney disease, or unspecified chronic kidney disease: Secondary | ICD-10-CM | POA: Diagnosis not present

## 2023-05-04 DIAGNOSIS — D692 Other nonthrombocytopenic purpura: Secondary | ICD-10-CM | POA: Diagnosis not present

## 2023-05-04 DIAGNOSIS — I5040 Unspecified combined systolic (congestive) and diastolic (congestive) heart failure: Secondary | ICD-10-CM | POA: Diagnosis not present

## 2023-05-04 DIAGNOSIS — R040 Epistaxis: Secondary | ICD-10-CM | POA: Diagnosis not present

## 2023-05-04 DIAGNOSIS — I4819 Other persistent atrial fibrillation: Secondary | ICD-10-CM | POA: Diagnosis not present

## 2023-05-04 DIAGNOSIS — I69351 Hemiplegia and hemiparesis following cerebral infarction affecting right dominant side: Secondary | ICD-10-CM | POA: Diagnosis not present

## 2023-05-04 DIAGNOSIS — Z794 Long term (current) use of insulin: Secondary | ICD-10-CM | POA: Diagnosis not present

## 2023-05-04 DIAGNOSIS — Z7901 Long term (current) use of anticoagulants: Secondary | ICD-10-CM | POA: Diagnosis not present

## 2023-05-04 DIAGNOSIS — E1122 Type 2 diabetes mellitus with diabetic chronic kidney disease: Secondary | ICD-10-CM | POA: Diagnosis not present

## 2023-05-04 DIAGNOSIS — L89151 Pressure ulcer of sacral region, stage 1: Secondary | ICD-10-CM | POA: Diagnosis not present

## 2023-05-05 DIAGNOSIS — Z951 Presence of aortocoronary bypass graft: Secondary | ICD-10-CM | POA: Diagnosis not present

## 2023-05-05 DIAGNOSIS — E039 Hypothyroidism, unspecified: Secondary | ICD-10-CM | POA: Diagnosis not present

## 2023-05-05 DIAGNOSIS — B356 Tinea cruris: Secondary | ICD-10-CM | POA: Diagnosis not present

## 2023-05-05 DIAGNOSIS — I5043 Acute on chronic combined systolic (congestive) and diastolic (congestive) heart failure: Secondary | ICD-10-CM | POA: Diagnosis not present

## 2023-05-05 DIAGNOSIS — R2689 Other abnormalities of gait and mobility: Secondary | ICD-10-CM | POA: Diagnosis not present

## 2023-05-05 DIAGNOSIS — E1151 Type 2 diabetes mellitus with diabetic peripheral angiopathy without gangrene: Secondary | ICD-10-CM | POA: Diagnosis not present

## 2023-05-05 DIAGNOSIS — R54 Age-related physical debility: Secondary | ICD-10-CM | POA: Diagnosis not present

## 2023-05-05 DIAGNOSIS — M6281 Muscle weakness (generalized): Secondary | ICD-10-CM | POA: Diagnosis not present

## 2023-05-05 DIAGNOSIS — G459 Transient cerebral ischemic attack, unspecified: Secondary | ICD-10-CM | POA: Diagnosis not present

## 2023-05-05 DIAGNOSIS — L89311 Pressure ulcer of right buttock, stage 1: Secondary | ICD-10-CM | POA: Diagnosis not present

## 2023-05-05 DIAGNOSIS — I69351 Hemiplegia and hemiparesis following cerebral infarction affecting right dominant side: Secondary | ICD-10-CM | POA: Diagnosis not present

## 2023-05-08 NOTE — Telephone Encounter (Signed)
Called and spoke with daughter Lynden Ang. Informed her of the following recommendations from DR. Gollan.   If creatinine is 1.5 he would meet indication for reduced dose Eliquis 2.5 twice a day as he is also over 89 years old  Creatinine of 1.5 is higher than recent numbers, raising the concern for dehydration  Prior to that creatinine 1.2  We can place a order for lab work in July  If creatinine continues to go up and down changing his Eliquis dose,  Sometimes in those situations we changed to Xarelto once a day  Thx  TGollan   Daughter verbalizes understanding. Daughter is requesting that Dr. Mariah Milling review the patient's medication list and see if he can take any of his morning medications at lunch time. Daughter say that the patient complains about how many medications he has to take in the morning.

## 2023-05-09 NOTE — Telephone Encounter (Signed)
Called and spoke with daughter regarding Eliquis recommendations from Dr. Mariah Milling on 05/08/23. See telephone encounter for 05/08/23.

## 2023-05-10 ENCOUNTER — Other Ambulatory Visit: Payer: Self-pay | Admitting: Nurse Practitioner

## 2023-05-10 DIAGNOSIS — I69398 Other sequelae of cerebral infarction: Secondary | ICD-10-CM | POA: Diagnosis not present

## 2023-05-10 DIAGNOSIS — Z9181 History of falling: Secondary | ICD-10-CM | POA: Diagnosis not present

## 2023-05-10 DIAGNOSIS — I5042 Chronic combined systolic (congestive) and diastolic (congestive) heart failure: Secondary | ICD-10-CM | POA: Diagnosis not present

## 2023-05-10 DIAGNOSIS — G629 Polyneuropathy, unspecified: Secondary | ICD-10-CM

## 2023-05-24 ENCOUNTER — Institutional Professional Consult (permissible substitution): Payer: PPO | Admitting: Cardiology

## 2023-05-24 NOTE — Telephone Encounter (Signed)
Called and spoke with the patient's daughter. Informed her of the following from Dr. Mariah Milling.  Appears he is coming back for appointment in July, we can talk about his medications and timing of meds then  Thx  TGollan   Daughter verbalizes understanding.

## 2023-05-25 ENCOUNTER — Other Ambulatory Visit: Payer: Self-pay | Admitting: Nurse Practitioner

## 2023-05-25 DIAGNOSIS — G2581 Restless legs syndrome: Secondary | ICD-10-CM

## 2023-05-27 ENCOUNTER — Other Ambulatory Visit: Payer: Self-pay | Admitting: Nurse Practitioner

## 2023-05-27 DIAGNOSIS — G2581 Restless legs syndrome: Secondary | ICD-10-CM

## 2023-06-01 DIAGNOSIS — E1142 Type 2 diabetes mellitus with diabetic polyneuropathy: Secondary | ICD-10-CM | POA: Diagnosis not present

## 2023-06-01 DIAGNOSIS — B351 Tinea unguium: Secondary | ICD-10-CM | POA: Diagnosis not present

## 2023-06-01 DIAGNOSIS — M79674 Pain in right toe(s): Secondary | ICD-10-CM | POA: Diagnosis not present

## 2023-06-01 DIAGNOSIS — Z794 Long term (current) use of insulin: Secondary | ICD-10-CM | POA: Diagnosis not present

## 2023-06-01 DIAGNOSIS — M79675 Pain in left toe(s): Secondary | ICD-10-CM | POA: Diagnosis not present

## 2023-06-02 ENCOUNTER — Ambulatory Visit: Payer: PPO | Attending: Cardiovascular Disease | Admitting: Cardiovascular Disease

## 2023-06-02 ENCOUNTER — Encounter: Payer: Self-pay | Admitting: Cardiovascular Disease

## 2023-06-02 VITALS — BP 138/62 | HR 67 | Ht 67.0 in | Wt 208.5 lb

## 2023-06-02 DIAGNOSIS — E1151 Type 2 diabetes mellitus with diabetic peripheral angiopathy without gangrene: Secondary | ICD-10-CM

## 2023-06-02 DIAGNOSIS — I25118 Atherosclerotic heart disease of native coronary artery with other forms of angina pectoris: Secondary | ICD-10-CM | POA: Diagnosis not present

## 2023-06-02 DIAGNOSIS — I10 Essential (primary) hypertension: Secondary | ICD-10-CM | POA: Diagnosis not present

## 2023-06-02 DIAGNOSIS — I480 Paroxysmal atrial fibrillation: Secondary | ICD-10-CM

## 2023-06-02 DIAGNOSIS — I5042 Chronic combined systolic (congestive) and diastolic (congestive) heart failure: Secondary | ICD-10-CM

## 2023-06-02 DIAGNOSIS — I5022 Chronic systolic (congestive) heart failure: Secondary | ICD-10-CM

## 2023-06-02 DIAGNOSIS — E1159 Type 2 diabetes mellitus with other circulatory complications: Secondary | ICD-10-CM

## 2023-06-02 DIAGNOSIS — I4821 Permanent atrial fibrillation: Secondary | ICD-10-CM

## 2023-06-02 DIAGNOSIS — I5033 Acute on chronic diastolic (congestive) heart failure: Secondary | ICD-10-CM

## 2023-06-02 DIAGNOSIS — I152 Hypertension secondary to endocrine disorders: Secondary | ICD-10-CM | POA: Diagnosis not present

## 2023-06-02 DIAGNOSIS — E782 Mixed hyperlipidemia: Secondary | ICD-10-CM

## 2023-06-02 DIAGNOSIS — Z794 Long term (current) use of insulin: Secondary | ICD-10-CM

## 2023-06-02 MED ORDER — TORSEMIDE 20 MG PO TABS
20.0000 mg | ORAL_TABLET | Freq: Every day | ORAL | 3 refills | Status: DC
Start: 2023-06-02 — End: 2024-08-27

## 2023-06-02 MED ORDER — APIXABAN 5 MG PO TABS
5.0000 mg | ORAL_TABLET | Freq: Two times a day (BID) | ORAL | 3 refills | Status: DC
Start: 2023-06-02 — End: 2023-06-06

## 2023-06-02 NOTE — Progress Notes (Signed)
Cardiology Office Note  Date:  06/02/2023   ID:  Joseph [name], DOB 1925/04/28, MRN [id number]  PCP:  Bosie Clos, MD   Chief Complaint  Patient presents with   [facility] follow up     Patient c/o bilateral LE edema. Medications reviewed by the patient's home medication list.     HPI:  Mr. Joseph [name] is a 89 year old gentleman with a hx of  CAD, CABG in 1994,  PAD , followed by Dr.  Wyn Quaker,  obesity,  hyperlipidemia,  arthritis of the knees,   fatigue, SOB, leg weakness,   lower extremity edema,  Carotid u/s  Less than 39% stenosis bilaterally AV block on beta-blockers Dilated cardiomyopathy ejection fraction 35 to 40% in June 2023 Ef 45 to 50% in may 2024 presenting for routine followup of his coronary artery disease and  chronic diastolic CHF, persistent atrial fibrillation  Last seen in clinic by myself oct 2023 CVA in Jan 2024   in the hospital May 2024 for acute stroke, weakness MRI in the emergency room confirming acute infarcts right posterior limb of the internal capsule, noted to have mild left sided facial droop  EKG showing atrial fibrillation Was concerned about starting anticoagulation given prior history of nosebleeds  Case was discussed with Dr. Lalla Brothers, felt not to be a candidate for Watchman device secondary to age  Still having nose bleed, right side, had one today, one yesterday Often days to a week between nose bleeds, will packed them himself at home to make them stop Appt with ENT Aug 1st  Labs 04/20/23 CR 1.2  Continues on torsemide 20 mg daily Weight at home 208 to 213  Trace lower extremity edema left leg  Followed by Dr. Tedd Sias, Dr. Sullivan Lone  loss of his wife 2022  EKG personally reviewed by myself on todays visit EKG Interpretation Date/Time:  Friday June 02 2023 10:41:01 EDT Ventricular Rate:  67 PR Interval:    QRS Duration:  122 QT Interval:  436 QTC Calculation: 460 R Axis:   -44  Text Interpretation: Atrial fibrillation with  a competing junctional pacemaker Left axis deviation Minimal voltage criteria for LVH, may be normal variant ( Cornell product ) Septal infarct , age undetermined Marked ST abnormality, possible lateral subendocardial injury When compared with ECG of 06-Apr-2023 12:03, PREVIOUS ECG IS PRESENT Confirmed by Julien Nordmann [phone]) on 06/02/2023 11:06:46 AM    Past medical history reviewed seen in [facility] emergency room Apr 18, 2022 Increased SOB,  Blood pressure markedly elevated 190/70 Troponin 22, BNP 308 Was in normal sinus rhythm Ended up leaving AMA with follow-up in our office  Other past medical history reviewed -cath 12/17/2019  Severe native CAD, including 80% distal LMCA, 100% proximal LAD and diffuse LCx/OM disease.  90% mid RCA stenosis and CTO of rPDA also present. Patent LIMA->D3 with backfilling of the mid/distal LADl. Patent SVG-D1-D2, SVG-OM1-OM2, and SVG-rPDA-rPL. Mildly to moderately elevated LVEDP. At least moderately reduced left ventricular systolic function.  Echo 12/17/2019  1. Left ventricular ejection fraction, by visual estimation, is 50 to  55%. The left ventricle has low normal function. There is mildly increased  left ventricular hypertrophy.  Other past medical history reviewed  headache 02/03/2017,  Seen in th ER for H/A 02/03/17 Confused,  In the emergency room had CT head, Showing atherosclerotic calcification of the cavernous carotid arteries bilaterally. Lab work within normal limits No other focal neurologic deficits Patient and family thinks he had a TIA  admission May 2017 for acute  cholecystitis, developing acute respiratory distress with hypoxia and acute renal disease, peak creatinine of 2. Notes indicating HIDA scan positive for acute cholecystitis. s/p cholecystostomy tube placed in through interventional radiology 03/24/16 Diuretic was discontinued in the setting of acute renal failure He does report greater than 20 pound weight loss    Echocardiogram 03/22/2016 reviewed with him, ejection fraction 50-55% Chest x-ray 03/29/2016 with small right pleural effusion   Ultrasound of the legs showing patent right SFA stent, left side CFA and popliteal artery with good flow, waveform deterioration around the ankle on the left   Last Cardiac catheterization was [facility] 08/16/2012 Catheterization showed severe three-vessel coronary artery disease with patent grafts x4. There was 80% disease of a small to moderate sized distal left circumflex. Ejection fraction 45-50%. No significant aortic valve stenosis or mitral valve regurgitation. The findings were discussed with interventional cardiology and medical management was recommended for the distal left circumflex disease.   PMH:   has a past medical history of Acute respiratory failure with hypoxia (HCC) (03/21/2016), CAD (coronary artery disease), Chronic diastolic CHF (congestive heart failure) (HCC), Diabetes mellitus, type 2 (HCC), HTN (hypertension), Hyperlipidemia, Hypothyroidism, Lower extremity edema, and Spinal stenosis.  PSH:    Past Surgical History:  Procedure Laterality Date   APPENDECTOMY     CARDIAC CATHETERIZATION  07/2012   [facility]/no stents   CHOLECYSTECTOMY     CORNEAL TRANSPLANT     CORONARY ARTERY BYPASS GRAFT  1994   HEMORRHOID SURGERY     LEFT HEART CATH AND CORS/GRAFTS ANGIOGRAPHY N/A 12/17/2019   Procedure: LEFT HEART CATH AND CORS/GRAFTS ANGIOGRAPHY;  Surgeon: Yvonne Kendall, MD;  Location: [facility] INVASIVE CV LAB;  Service: Cardiovascular;  Laterality: N/A;   PROSTATE BIOPSY     TOE SURGERY     ingrown toe nail   Current Outpatient Medications on File Prior to Visit  Medication Sig Dispense Refill   apixaban (ELIQUIS) 2.5 MG TABS tablet Take 2.5 mg by mouth 2 (two) times daily.     atorvastatin (LIPITOR) 80 MG tablet Take 1 tablet (80 mg total) by mouth daily. 30 tablet 0   fenofibrate 54 MG tablet Take 1 tablet (54 mg total) by mouth daily. 30  tablet 0   gabapentin (NEURONTIN) 100 MG capsule Take 200 mg by mouth at bedtime.     insulin aspart (NOVOLOG) 100 UNIT/ML injection Inject 20 units once daily in the afternoon. Inject 25 units once daily in the eveing. Inject 25 units once daily. 10 mL 0   insulin glargine (LANTUS) 100 UNIT/ML injection Inject 0.4 mLs (40 Units total) into the skin daily. 10 mL 0   levothyroxine (SYNTHROID) 50 MCG tablet Take 1 tablet (50 mcg total) by mouth daily before breakfast. 30 tablet 0   losartan (COZAAR) 100 MG tablet Take 1 tablet (100 mg total) by mouth daily. 30 tablet 0   nitroGLYCERIN (NITROSTAT) 0.4 MG SL tablet Place 1 tablet (0.4 mg total) under the tongue every 5 (five) minutes as needed for chest pain. 30 tablet 0   omeprazole (PRILOSEC) 20 MG capsule Take 1 capsule (20 mg total) by mouth daily. 30 capsule 0   oxymetazoline (AFRIN) 0.05 % nasal spray Place 2 sprays into both nostrils once for nose bleed, seek medical attention if bleeding persists after 2 sprays, do not repeat dose 6 mL 0   potassium chloride (KLOR-CON) 10 MEQ tablet Take 1 tablet (10 mEq total) by mouth daily. 30 tablet 0   rOPINIRole (REQUIP) 0.5 MG  tablet Take 1 tablet (0.5 mg total) by mouth 2 (two) times daily. 60 tablet 0   torsemide (DEMADEX) 20 MG tablet Take 1 tablet (20 mg total) by mouth daily. 30 tablet 0   Zinc Oxide (TRIPLE PASTE) 12.8 % ointment Apply 1 Application topically as needed for irritation. Every shift.     Infant [employer] [facility]) OINT Apply 1 application  topically daily as needed. Apply to peri area topically every shift for skin protectant (Patient not taking: Reported on 06/02/2023)     No current facility-administered medications on file prior to visit.     Allergies:   Farxiga [dapagliflozin]   Social History:  The patient  reports that he quit smoking about 48 years ago. His smoking use included pipe, cigars, and cigarettes. He has never used smokeless tobacco. He reports that he does  not drink alcohol and does not use drugs.   Family History:   family history includes Heart attack in his father; Heart failure in his maternal grandfather, maternal uncle, and mother; Kidney Stones in his son; Sleep apnea in his daughter and son; Stroke in his brother.    Review of Systems: Review of Systems  Constitutional: Negative.        Gait instability  HENT: Negative.    Respiratory: Negative.    Cardiovascular: Negative.   Gastrointestinal: Negative.   Musculoskeletal: Negative.   Neurological: Negative.   Psychiatric/Behavioral: Negative.    All other systems reviewed and are negative.  PHYSICAL EXAM: VS:  BP 138/62 (BP Location: Left Arm, Patient Position: Sitting, Cuff Size: Normal)   Pulse 67   Ht 5\' 7"  (1.702 m)   Wt 208 lb 8 oz (94.6 kg)   SpO2 96%   BMI 32.66 kg/m  , BMI Body mass index is 32.66 kg/m. Constitutional:  oriented to person, place, and time. No distress.  HENT:  Head: Grossly normal Eyes:  no discharge. No scleral icterus.  Neck: No JVD, no carotid bruits  Cardiovascular: Irregularly irregular no murmurs appreciated Pulmonary/Chest: Clear to auscultation bilaterally, no wheezes or rails Abdominal: Soft.  no distension.  no tenderness.  Musculoskeletal: Normal range of motion Neurological:  normal muscle tone. Coordination normal. No atrophy Skin: Skin warm and dry Psychiatric: normal affect, pleasant   Recent Labs: 04/06/2023: ALT 15; B Natriuretic Peptide 97.9; TSH 1.962 04/10/2023: Platelets 240 04/11/2023: Magnesium 1.9 04/13/2023: Hemoglobin 11.3 04/20/2023: BUN 19; Creatinine 1.2; Potassium 3.8; Sodium 140    Lipid Panel Lab Results  Component Value Date   CHOL 126 04/07/2023   HDL 28 (L) 04/07/2023   LDLCALC 70 04/07/2023   TRIG 138 04/07/2023      Wt Readings from Last 3 Encounters:  06/02/23 208 lb 8 oz (94.6 kg)  04/25/23 207 lb 8 oz (94.1 kg)  04/21/23 207 lb 6.4 oz (94.1 kg)     ASSESSMENT AND PLAN:   Persistent  atrial fibrillation History of stroke x 2 January and May 2024 Rate well-controlled Dose of Eliquis decreased by the [place] down to 2.5 twice daily for elevated creatinine 1.6 back in April 2024, since then renal function back to his baseline most recently 1.2 Will recommend to increase Eliquis back to 5 twice daily as he does not qualify for reduced dosing Of concern is chronic nosebleeds He is scheduled to see ENT June 22, 2023 If unable to tolerate Eliquis 5 twice daily, we will reduce dose down to 2.5 twice daily until nosebleeds under control Not a candidate for watchman based on  age  Ischemic cardiomyopathy Prior catheterization 2001 with severe disease, medical management recommended Previously declined cardiac catheterization Tolerating losartan and torsemide Not on beta-blocker secondary to bradycardia Unable to afford Clifton Custard Does not appear to be taking spironolactone  Mixed hyperlipidemia -  Cholesterol is at goal on the current lipid regimen. No changes to the medications were made.  HYPERTENSION, BENIGN -  Blood pressure is well controlled on today's visit. No changes made to the medications.  Atrial tachycardia Beta-blocker held for AV block Remains in atrial fibrillation rate well-controlled  Atherosclerosis of coronary artery bypass graft of native heart with stable angina pectoris [facility]) -  Catheterization 2021 detailing patent grafts For cardiomyopathy, he has declined repeat catheterization  Diabetes mellitus with peripheral vascular disease (HCC) -  A1c 6.9 Followed by endocrinology  Diastolic CHF, acute on chronic (HCC) -  Euvolemic, continue torsemide 20 daily, extra torsemide for weight over 213 pounds Potassium 10 daily  Morbid obesity due to excess calories (HCC) -  Calorie restriction recommended  Leg edema Left greater than right, chronic issue Continue torsemide daily Stable renal function  PAD (peripheral artery disease) (HCC) -   Cholesterol at goal, A1c high Minimally active,  Minimal disease carotid in 2018   Total encounter time more than 40 minutes  Greater than 50% was spent in counseling and coordination of care with the patient    Orders Placed This Encounter  Procedures   EKG 12-Lead     Signed, Dossie Arbour, M.D., Ph.D. 06/02/2023  [facility] [place], [place] [phone]

## 2023-06-02 NOTE — Patient Instructions (Addendum)
Medication Instructions:  Eliquis back to 5 mg twice a day If nose bleeds get worse,  Go back to eliquis 2.5 twice a day  For weight 213 or high, take extra torsemide 20 mg after lunch  If you need a refill on your cardiac medications before your next appointment, please call your pharmacy.   Lab work: No new labs needed  Testing/Procedures: No new testing needed  Follow-Up: At [facility], you and your health needs are our priority.  As part of our continuing mission to provide you with exceptional heart care, we have created designated Provider Care Teams.  These Care Teams include your primary Cardiologist (physician) and Advanced Practice Providers (APPs -  Physician Assistants and Nurse Practitioners) who all work together to provide you with the care you need, when you need it.  You will need a follow up appointment in 6 months  Providers on your designated Care Team:   Nicolasa Ducking, NP Eula Listen, PA-C Cadence Fransico Michael, [place]  COVID-19 Vaccine Information can be found at: [email or web address] For questions related to vaccine distribution or appointments, please email [email or web address] or call [phone].

## 2023-06-06 ENCOUNTER — Other Ambulatory Visit: Payer: Self-pay

## 2023-06-06 ENCOUNTER — Other Ambulatory Visit: Payer: Self-pay | Admitting: Nurse Practitioner

## 2023-06-06 ENCOUNTER — Telehealth: Payer: Self-pay | Admitting: Cardiovascular Disease

## 2023-06-06 DIAGNOSIS — I4821 Permanent atrial fibrillation: Secondary | ICD-10-CM

## 2023-06-06 DIAGNOSIS — E039 Hypothyroidism, unspecified: Secondary | ICD-10-CM

## 2023-06-06 DIAGNOSIS — I5042 Chronic combined systolic (congestive) and diastolic (congestive) heart failure: Secondary | ICD-10-CM

## 2023-06-06 MED ORDER — APIXABAN 5 MG PO TABS
5.0000 mg | ORAL_TABLET | Freq: Two times a day (BID) | ORAL | 1 refills | Status: DC
Start: 2023-06-06 — End: 2024-03-22

## 2023-06-06 NOTE — Telephone Encounter (Signed)
Please review

## 2023-06-06 NOTE — Telephone Encounter (Signed)
*  STAT* If patient is at the pharmacy, call can be transferred to refill team.   1. Which medications need to be refilled? (please list name of each medication and dose if known)  need a new prescription for his Eliquis  with the new directions  that was changed on 06-02-23  2. Which pharmacy/location (including street and city if local pharmacy) is medication to be sent to?[facility] [phone]  prescription number is #[id number]  3. Do they need a 30 day or 90 day supply? 90 days and refills

## 2023-06-08 ENCOUNTER — Other Ambulatory Visit: Payer: Self-pay | Admitting: Nurse Practitioner

## 2023-06-08 DIAGNOSIS — E039 Hypothyroidism, unspecified: Secondary | ICD-10-CM

## 2023-06-08 DIAGNOSIS — I5042 Chronic combined systolic (congestive) and diastolic (congestive) heart failure: Secondary | ICD-10-CM

## 2023-06-09 DIAGNOSIS — Z9181 History of falling: Secondary | ICD-10-CM | POA: Diagnosis not present

## 2023-06-09 DIAGNOSIS — I69398 Other sequelae of cerebral infarction: Secondary | ICD-10-CM | POA: Diagnosis not present

## 2023-06-09 DIAGNOSIS — I5042 Chronic combined systolic (congestive) and diastolic (congestive) heart failure: Secondary | ICD-10-CM | POA: Diagnosis not present

## 2023-06-13 ENCOUNTER — Other Ambulatory Visit: Payer: Self-pay | Admitting: Nurse Practitioner

## 2023-06-13 DIAGNOSIS — I5042 Chronic combined systolic (congestive) and diastolic (congestive) heart failure: Secondary | ICD-10-CM

## 2023-06-21 DIAGNOSIS — E1142 Type 2 diabetes mellitus with diabetic polyneuropathy: Secondary | ICD-10-CM | POA: Diagnosis not present

## 2023-06-21 DIAGNOSIS — E1159 Type 2 diabetes mellitus with other circulatory complications: Secondary | ICD-10-CM | POA: Diagnosis not present

## 2023-06-21 DIAGNOSIS — E785 Hyperlipidemia, unspecified: Secondary | ICD-10-CM | POA: Diagnosis not present

## 2023-06-21 DIAGNOSIS — R809 Proteinuria, unspecified: Secondary | ICD-10-CM | POA: Diagnosis not present

## 2023-06-21 DIAGNOSIS — Z794 Long term (current) use of insulin: Secondary | ICD-10-CM | POA: Diagnosis not present

## 2023-06-21 DIAGNOSIS — Z8673 Personal history of transient ischemic attack (TIA), and cerebral infarction without residual deficits: Secondary | ICD-10-CM | POA: Diagnosis not present

## 2023-06-21 DIAGNOSIS — E1129 Type 2 diabetes mellitus with other diabetic kidney complication: Secondary | ICD-10-CM | POA: Diagnosis not present

## 2023-06-21 DIAGNOSIS — E1169 Type 2 diabetes mellitus with other specified complication: Secondary | ICD-10-CM | POA: Diagnosis not present

## 2023-06-22 ENCOUNTER — Telehealth: Payer: Self-pay | Admitting: Cardiovascular Disease

## 2023-06-22 DIAGNOSIS — T45515A Adverse effect of anticoagulants, initial encounter: Secondary | ICD-10-CM | POA: Diagnosis not present

## 2023-06-22 DIAGNOSIS — R040 Epistaxis: Secondary | ICD-10-CM | POA: Diagnosis not present

## 2023-06-22 NOTE — Telephone Encounter (Signed)
   Pre-operative Risk Assessment    Patient Name: Joseph [name]  DOB: [birth year] MRN: [id number]      Request for Surgical Clearance    Procedure:   nasal package put in   Date of Surgery:  Clearance 06/22/23                                 Surgeon:  Dr Jenne Campus Surgeon's Group or Practice Name:  [facility] Phone number:  [phone] Fax number:  [phone]   Type of Clearance Requested:   - Pharmacy:  Hold Apixaban (Eliquis) 5 days after    Type of Anesthesia:  None    Additional requests/questions:      Melissa Noon   06/22/2023, 4:10 PM

## 2023-06-22 NOTE — Telephone Encounter (Signed)
New Messsage:     Patient's daughter called and wanted you to be on the look out for this patient's clearance. It will come from from Dr Mirna Mires; [place].She said he must stop his blood thinner tonight and he needs to stop it for 5 days.

## 2023-06-23 NOTE — Telephone Encounter (Signed)
Janey Greaser from [facility] made aware   Lenor Derrick, RN  [facility] Triage; Anselm Lis A, CMA1 minute ago (12:47 PM)    Ok to hold Eliquis for the 5 days per Dr. Mariah Milling

## 2023-06-23 NOTE — Telephone Encounter (Signed)
Pts daughter is back in [place]. Pt was notified that per Dr. Mariah Milling it is ok to hold Eliquis for 5 days.

## 2023-06-23 NOTE — Telephone Encounter (Signed)
Pt's daughter states she never heard anything from the office about stopping a medication. Please advise.

## 2023-06-23 NOTE — Telephone Encounter (Signed)
I called [facility] to speak with Penni Bombard. She stated that pt was in office yesterday with nosebleeds. They packed it and need to know if pt can hold Eliquis for 5 days. Please advise. Thank you!

## 2023-06-23 NOTE — Telephone Encounter (Signed)
This is not a stand procedure hold. Will need to go to MD.

## 2023-06-26 NOTE — Telephone Encounter (Addendum)
   Name: Joseph [name]  DOB: Jul 09, 1925  MRN: [id number]   Primary Cardiologist: Julien Nordmann, MD  Chart reviewed as part of pre-operative protocol coverage.   Per Dr. Mariah Milling, patient may hold Eliquis for 5 days. Patient was notified on 06/23/2023.  I will route this recommendation to the requesting party via Epic fax function and remove from pre-op pool. Please call with questions.  Carlos Levering, NP 06/26/2023, 10:16 AM

## 2023-06-27 DIAGNOSIS — T45515A Adverse effect of anticoagulants, initial encounter: Secondary | ICD-10-CM | POA: Diagnosis not present

## 2023-06-27 DIAGNOSIS — R040 Epistaxis: Secondary | ICD-10-CM | POA: Diagnosis not present

## 2023-07-04 DIAGNOSIS — Z947 Corneal transplant status: Secondary | ICD-10-CM | POA: Diagnosis not present

## 2023-07-04 DIAGNOSIS — E119 Type 2 diabetes mellitus without complications: Secondary | ICD-10-CM | POA: Diagnosis not present

## 2023-07-04 DIAGNOSIS — Z961 Presence of intraocular lens: Secondary | ICD-10-CM | POA: Diagnosis not present

## 2023-07-05 DIAGNOSIS — Z515 Encounter for palliative care: Secondary | ICD-10-CM | POA: Diagnosis not present

## 2023-07-05 DIAGNOSIS — I5040 Unspecified combined systolic (congestive) and diastolic (congestive) heart failure: Secondary | ICD-10-CM | POA: Diagnosis not present

## 2023-07-05 DIAGNOSIS — E1165 Type 2 diabetes mellitus with hyperglycemia: Secondary | ICD-10-CM | POA: Diagnosis not present

## 2023-07-05 DIAGNOSIS — I4819 Other persistent atrial fibrillation: Secondary | ICD-10-CM | POA: Diagnosis not present

## 2023-07-05 DIAGNOSIS — D692 Other nonthrombocytopenic purpura: Secondary | ICD-10-CM | POA: Diagnosis not present

## 2023-07-05 DIAGNOSIS — Z794 Long term (current) use of insulin: Secondary | ICD-10-CM | POA: Diagnosis not present

## 2023-07-05 DIAGNOSIS — N1832 Chronic kidney disease, stage 3b: Secondary | ICD-10-CM | POA: Diagnosis not present

## 2023-07-05 DIAGNOSIS — E1142 Type 2 diabetes mellitus with diabetic polyneuropathy: Secondary | ICD-10-CM | POA: Diagnosis not present

## 2023-07-05 DIAGNOSIS — E1122 Type 2 diabetes mellitus with diabetic chronic kidney disease: Secondary | ICD-10-CM | POA: Diagnosis not present

## 2023-07-05 DIAGNOSIS — I69351 Hemiplegia and hemiparesis following cerebral infarction affecting right dominant side: Secondary | ICD-10-CM | POA: Diagnosis not present

## 2023-07-05 DIAGNOSIS — I110 Hypertensive heart disease with heart failure: Secondary | ICD-10-CM | POA: Diagnosis not present

## 2023-07-05 DIAGNOSIS — Z6833 Body mass index (BMI) 33.0-33.9, adult: Secondary | ICD-10-CM | POA: Diagnosis not present

## 2023-07-09 ENCOUNTER — Emergency Department
Admission: EM | Admit: 2023-07-09 | Discharge: 2023-07-09 | Disposition: A | Discharge status: Home / Self Care | Payer: PPO | Attending: Emergency Medicine | Admitting: Emergency Medicine

## 2023-07-09 ENCOUNTER — Other Ambulatory Visit: Payer: Self-pay

## 2023-07-09 DIAGNOSIS — E1165 Type 2 diabetes mellitus with hyperglycemia: Secondary | ICD-10-CM | POA: Diagnosis not present

## 2023-07-09 DIAGNOSIS — E860 Dehydration: Secondary | ICD-10-CM | POA: Diagnosis not present

## 2023-07-09 DIAGNOSIS — E1065 Type 1 diabetes mellitus with hyperglycemia: Secondary | ICD-10-CM | POA: Diagnosis not present

## 2023-07-09 DIAGNOSIS — R739 Hyperglycemia, unspecified: Secondary | ICD-10-CM

## 2023-07-09 HISTORY — DX: Cerebral infarction, unspecified: I63.9

## 2023-07-09 LAB — CBC
HCT: 41.2 % (ref 39.0–52.0)
Hemoglobin: 13.4 g/dL (ref 13.0–17.0)
MCH: 29.7 pg (ref 26.0–34.0)
MCHC: 32.5 g/dL (ref 30.0–36.0)
MCV: 91.4 fL (ref 80.0–100.0)
Platelets: 255 10*3/uL (ref 150–400)
RBC: 4.51 MIL/uL (ref 4.22–5.81)
RDW: 13.6 % (ref 11.5–15.5)
WBC: 6.9 10*3/uL (ref 4.0–10.5)
nRBC: 0 % (ref 0.0–0.2)

## 2023-07-09 LAB — BASIC METABOLIC PANEL
Anion gap: 10 (ref 5–15)
BUN: 21 mg/dL (ref 8–23)
CO2: 28 mmol/L (ref 22–32)
Calcium: 9.4 mg/dL (ref 8.9–10.3)
Chloride: 105 mmol/L (ref 98–111)
Creatinine, Ser: 1.41 mg/dL — ABNORMAL HIGH (ref 0.61–1.24)
GFR, Estimated: 46 mL/min — ABNORMAL LOW (ref >60)
Glucose, Bld: 159 mg/dL — ABNORMAL HIGH (ref 70–99)
Potassium: 4.4 mmol/L (ref 3.5–5.1)
Sodium: 143 mmol/L (ref 135–145)

## 2023-07-09 LAB — CBG MONITORING, ED: Glucose-Capillary: 152 mg/dL — ABNORMAL HIGH (ref 70–99)

## 2023-07-09 NOTE — ED Notes (Signed)
Pt verbalizes understanding of discharge instructions. Opportunity for questioning and answers were provided. Pt discharged from ED to home with family.    

## 2023-07-09 NOTE — Discharge Instructions (Signed)
You were seen in the emergency department today for your elevated blood sugar.  There is no evidence of DKA on labs today.  Your creatinine otherwise is relatively stable with your baseline.  Please follow-up with your primary care provider and endocrinologist for ongoing management.

## 2023-07-09 NOTE — ED Provider Notes (Signed)
[facility] Provider Note    Event Date/Time   First MD Initiated Contact with Patient 07/09/23 1836     (approximate)   History   Hyperglycemia   HPI Joseph [name] is a 89 y.o. male with type 1 diabetes who presents today for hyperglycemia.  At home family his found his blood sugars to be in the 200-300s consistently over the past couple of weeks.  Patient is otherwise not having any fevers, chills, abdominal pain, nausea, vomiting, dysuria, hematuria, chest pain, shortness of breath.  His home nurse came by and saw glucose in his urine and told him to come here for further evaluation to have his kidneys checked.  Spoke with daughter in the room as well who notes they have had some issues with the readings of his home blood sugar checks.  They are unsure if this has been falsely elevated or if these are true readings.  They are planning to see the PCP and endocrinologist in the next couple weeks and came here as a precaution.     Physical Exam   Triage Vital Signs: ED Triage Vitals  Encounter Vitals Group     BP 07/09/23 1658 (!) 155/115     Systolic BP Percentile --      Diastolic BP Percentile --      Pulse Rate 07/09/23 1658 63     Resp 07/09/23 1658 20     Temp 07/09/23 1658 98.7 F (37.1 C)     Temp Source 07/09/23 1658 Oral     SpO2 07/09/23 1658 93 %     Weight 07/09/23 1702 209 lb (94.8 kg)     Height 07/09/23 1702 5\' 4"  (1.626 m)     Head Circumference --      Peak Flow --      Pain Score 07/09/23 1700 0     Pain Loc --      Pain Education --      Exclude from Growth Chart --     Most recent vital signs: Vitals:   07/09/23 1658  BP: (!) 155/115  Pulse: 63  Resp: 20  Temp: 98.7 F (37.1 C)  SpO2: 93%   Physical Exam: I have reviewed the vital signs and nursing notes. General: Awake, alert, no acute distress.  Nontoxic appearing. Head:  Atraumatic, normocephalic.   ENT:  EOM intact, PERRL. Oral mucosa is pink and moist  with no lesions. Neck: Neck is supple with full range of motion, No meningeal signs. Cardiovascular:  RRR, No murmurs. Peripheral pulses palpable and equal bilaterally. Respiratory:  Symmetrical chest wall expansion.  No rhonchi, rales, or wheezes.  Good air movement throughout.  No use of accessory muscles.   Musculoskeletal:  No cyanosis or edema. Moving extremities with full ROM Abdomen:  Soft, nontender, nondistended. Neuro:  GCS 15, moving all four extremities, interacting appropriately. Speech clear. Psych:  Calm, appropriate.   Skin:  Warm, dry, no rash.    ED Results / Procedures / Treatments   Labs (all labs ordered are listed, but only abnormal results are displayed) Labs Reviewed  BASIC METABOLIC PANEL - Abnormal; Notable for the following components:      Result Value   Glucose, Bld 159 (*)    Creatinine, Ser 1.41 (*)    GFR, Estimated 46 (*)    All other components within normal limits  CBG MONITORING, ED - Abnormal; Notable for the following components:   Glucose-Capillary 152 (*)    All  other components within normal limits  CBC  CBG MONITORING, ED     EKG    RADIOLOGY    PROCEDURES:  Critical Care performed: No  Procedures   MEDICATIONS ORDERED IN ED: Medications - No data to display   IMPRESSION / MDM / ASSESSMENT AND PLAN / ED COURSE  I reviewed the triage vital signs and the nursing notes.                              Differential diagnosis includes, but is not limited to, hyperglycemia, DKA, HHS, viral infection, dehydration.  Patient's presentation is most consistent with severe exacerbation of chronic illness.  Patient is a 89 year old male with type 1 diabetes presenting today for hyperglycemia.  Patient asymptomatic on exam and vital signs are stable with unremarkable physical exam.  Laboratory workup shows blood glucose of 159 with no evidence of DKA.  CBC also reassuring and unremarkable.  His creatinine is 1.41 but have noticed  prior elevations and this is pretty comparable to his recent baseline.  Do not suspect severe dehydration.  Patient otherwise stable for discharge at this time without any severe side effects of hyperglycemia.  He is instructed to follow-up with his primary care provider and endocrinologist for ongoing diabetes management.  The patient is on the cardiac monitor to evaluate for evidence of arrhythmia and/or significant heart rate changes. Clinical Course as of 07/09/23 1912  Wynelle Link Jul 09, 2023  1836 Basic metabolic panel(!) No evidence of DKA. Creatinine similar to baseline.  [DW]    Clinical Course User Index [DW] Janith Lima, MD     FINAL CLINICAL IMPRESSION(S) / ED DIAGNOSES   Final diagnoses:  Hyperglycemia  Dehydration     Rx / DC Orders   ED Discharge Orders     None        Note:  This document was prepared using Dragon voice recognition software and may include unintentional dictation errors.   Janith Lima, MD 07/09/23 [phone]

## 2023-07-09 NOTE — ED Triage Notes (Signed)
Pt to ED for high blood sugar. Has sensor to L arm. Reading 300s all this week. Pt states had 2 strokes in 2024. Last reading from sensor is 143. Home nurse did UA, sugar in urine and urinating frequently. Home health nurse suggested to have kidney function tested. Type 1 DM, pancreas nonfunctional. Takes lantus and novolog.  Also gets lots of nosebleeds, on eliquis.   CBG here is 152.

## 2023-07-10 DIAGNOSIS — I5042 Chronic combined systolic (congestive) and diastolic (congestive) heart failure: Secondary | ICD-10-CM | POA: Diagnosis not present

## 2023-07-10 DIAGNOSIS — Z9181 History of falling: Secondary | ICD-10-CM | POA: Diagnosis not present

## 2023-07-10 DIAGNOSIS — I69398 Other sequelae of cerebral infarction: Secondary | ICD-10-CM | POA: Diagnosis not present

## 2023-07-13 DIAGNOSIS — L304 Erythema intertrigo: Secondary | ICD-10-CM | POA: Diagnosis not present

## 2023-07-13 DIAGNOSIS — Z741 Need for assistance with personal care: Secondary | ICD-10-CM | POA: Diagnosis not present

## 2023-07-13 DIAGNOSIS — I69351 Hemiplegia and hemiparesis following cerebral infarction affecting right dominant side: Secondary | ICD-10-CM | POA: Diagnosis not present

## 2023-07-13 DIAGNOSIS — Z794 Long term (current) use of insulin: Secondary | ICD-10-CM | POA: Diagnosis not present

## 2023-07-13 DIAGNOSIS — N1831 Chronic kidney disease, stage 3a: Secondary | ICD-10-CM | POA: Diagnosis not present

## 2023-07-13 DIAGNOSIS — E039 Hypothyroidism, unspecified: Secondary | ICD-10-CM | POA: Diagnosis not present

## 2023-07-13 DIAGNOSIS — I5043 Acute on chronic combined systolic (congestive) and diastolic (congestive) heart failure: Secondary | ICD-10-CM | POA: Diagnosis not present

## 2023-07-13 DIAGNOSIS — Z951 Presence of aortocoronary bypass graft: Secondary | ICD-10-CM | POA: Diagnosis not present

## 2023-07-13 DIAGNOSIS — I4821 Permanent atrial fibrillation: Secondary | ICD-10-CM | POA: Diagnosis not present

## 2023-07-13 DIAGNOSIS — R54 Age-related physical debility: Secondary | ICD-10-CM | POA: Diagnosis not present

## 2023-07-13 DIAGNOSIS — E1151 Type 2 diabetes mellitus with diabetic peripheral angiopathy without gangrene: Secondary | ICD-10-CM | POA: Diagnosis not present

## 2023-07-14 ENCOUNTER — Telehealth: Payer: Self-pay

## 2023-07-14 DIAGNOSIS — Z794 Long term (current) use of insulin: Secondary | ICD-10-CM | POA: Diagnosis not present

## 2023-07-14 DIAGNOSIS — E1165 Type 2 diabetes mellitus with hyperglycemia: Secondary | ICD-10-CM | POA: Diagnosis not present

## 2023-07-14 NOTE — Telephone Encounter (Signed)
Transition Care Management Follow-up Telephone Call Date of discharge and from where: 07/09/2023 [facility] How have you been since you were released from the hospital? Patient stated he is feeling better. Any questions or concerns? No  Items Reviewed: Did the pt receive and understand the discharge instructions provided? Yes  Medications obtained and verified?  No medication prescribed. Other? No  Any new allergies since your discharge? No  Dietary orders reviewed? Yes Do you have support at home? Yes   Follow up appointments reviewed:  PCP Hospital f/u appt confirmed?  Patient's daughter called PCP for advise following ED visit 07/12/2023.  Scheduled to see  on  @ . Specialist Hospital f/u appt confirmed? No  Scheduled to see  on  @ . Are transportation arrangements needed? No  If their condition worsens, is the pt aware to call PCP or go to the Emergency Dept.? Yes Was the patient provided with contact information for the PCP's office or ED? Yes Was to pt encouraged to call back with questions or concerns? Yes  Anastasio Wogan [facility]  [facility] Population Health Community Resource Care Guide   [email or web address]  ?? [phone]   Website: [email or web address]  [email or web address]

## 2023-08-02 DIAGNOSIS — R051 Acute cough: Secondary | ICD-10-CM | POA: Diagnosis not present

## 2023-08-02 DIAGNOSIS — L304 Erythema intertrigo: Secondary | ICD-10-CM | POA: Diagnosis not present

## 2023-08-02 DIAGNOSIS — E1151 Type 2 diabetes mellitus with diabetic peripheral angiopathy without gangrene: Secondary | ICD-10-CM | POA: Diagnosis not present

## 2023-08-02 DIAGNOSIS — Z794 Long term (current) use of insulin: Secondary | ICD-10-CM | POA: Diagnosis not present

## 2023-08-02 DIAGNOSIS — J189 Pneumonia, unspecified organism: Secondary | ICD-10-CM | POA: Diagnosis not present

## 2023-08-02 DIAGNOSIS — Z951 Presence of aortocoronary bypass graft: Secondary | ICD-10-CM | POA: Diagnosis not present

## 2023-08-05 ENCOUNTER — Other Ambulatory Visit: Payer: Self-pay | Admitting: Nurse Practitioner

## 2023-08-05 DIAGNOSIS — E782 Mixed hyperlipidemia: Secondary | ICD-10-CM

## 2023-08-10 ENCOUNTER — Other Ambulatory Visit: Payer: Self-pay | Admitting: Nurse Practitioner

## 2023-08-10 DIAGNOSIS — E782 Mixed hyperlipidemia: Secondary | ICD-10-CM

## 2023-08-10 DIAGNOSIS — E785 Hyperlipidemia, unspecified: Secondary | ICD-10-CM | POA: Diagnosis not present

## 2023-08-10 DIAGNOSIS — E1129 Type 2 diabetes mellitus with other diabetic kidney complication: Secondary | ICD-10-CM | POA: Diagnosis not present

## 2023-08-10 DIAGNOSIS — I69398 Other sequelae of cerebral infarction: Secondary | ICD-10-CM | POA: Diagnosis not present

## 2023-08-10 DIAGNOSIS — Z794 Long term (current) use of insulin: Secondary | ICD-10-CM | POA: Diagnosis not present

## 2023-08-10 DIAGNOSIS — R809 Proteinuria, unspecified: Secondary | ICD-10-CM | POA: Diagnosis not present

## 2023-08-10 DIAGNOSIS — E1159 Type 2 diabetes mellitus with other circulatory complications: Secondary | ICD-10-CM | POA: Diagnosis not present

## 2023-08-10 DIAGNOSIS — E1169 Type 2 diabetes mellitus with other specified complication: Secondary | ICD-10-CM | POA: Diagnosis not present

## 2023-08-10 DIAGNOSIS — Z8673 Personal history of transient ischemic attack (TIA), and cerebral infarction without residual deficits: Secondary | ICD-10-CM | POA: Diagnosis not present

## 2023-08-10 DIAGNOSIS — I5042 Chronic combined systolic (congestive) and diastolic (congestive) heart failure: Secondary | ICD-10-CM | POA: Diagnosis not present

## 2023-08-10 DIAGNOSIS — E1142 Type 2 diabetes mellitus with diabetic polyneuropathy: Secondary | ICD-10-CM | POA: Diagnosis not present

## 2023-08-10 DIAGNOSIS — Z9181 History of falling: Secondary | ICD-10-CM | POA: Diagnosis not present

## 2023-08-21 ENCOUNTER — Telehealth: Payer: Self-pay | Admitting: Cardiovascular Disease

## 2023-08-21 DIAGNOSIS — I10 Essential (primary) hypertension: Secondary | ICD-10-CM

## 2023-08-21 NOTE — Telephone Encounter (Signed)
*  STAT* If patient is at the pharmacy, call can be transferred to refill team.   1. Which medications need to be refilled? (please list name of each medication and dose if known)   losartan (COZAAR) 100 MG tablet    2. Which pharmacy/location (including street and city if local pharmacy) is medication to be sent to? [facility] [address] [place] - [address]   3. Do they need a 30 day or 90 day supply? 90

## 2023-08-22 MED ORDER — LOSARTAN POTASSIUM 100 MG PO TABS
100.0000 mg | ORAL_TABLET | Freq: Every day | ORAL | 3 refills | Status: DC
Start: 2023-08-22 — End: 2024-08-07

## 2023-08-22 NOTE — Telephone Encounter (Signed)
Please advise if ok to refill Losartan medication last filled by  Date: 04/26/2023 Department: [facility] Ordering/Authorizing: Sharon Seller, NP

## 2023-09-09 DIAGNOSIS — Z9181 History of falling: Secondary | ICD-10-CM | POA: Diagnosis not present

## 2023-09-09 DIAGNOSIS — I5042 Chronic combined systolic (congestive) and diastolic (congestive) heart failure: Secondary | ICD-10-CM | POA: Diagnosis not present

## 2023-09-09 DIAGNOSIS — I69398 Other sequelae of cerebral infarction: Secondary | ICD-10-CM | POA: Diagnosis not present

## 2023-09-14 DIAGNOSIS — Z794 Long term (current) use of insulin: Secondary | ICD-10-CM | POA: Diagnosis not present

## 2023-09-14 DIAGNOSIS — E1129 Type 2 diabetes mellitus with other diabetic kidney complication: Secondary | ICD-10-CM | POA: Diagnosis not present

## 2023-09-14 DIAGNOSIS — R809 Proteinuria, unspecified: Secondary | ICD-10-CM | POA: Diagnosis not present

## 2023-09-19 DIAGNOSIS — I4819 Other persistent atrial fibrillation: Secondary | ICD-10-CM | POA: Diagnosis not present

## 2023-09-19 DIAGNOSIS — E1122 Type 2 diabetes mellitus with diabetic chronic kidney disease: Secondary | ICD-10-CM | POA: Diagnosis not present

## 2023-09-19 DIAGNOSIS — Z794 Long term (current) use of insulin: Secondary | ICD-10-CM | POA: Diagnosis not present

## 2023-09-19 DIAGNOSIS — I5040 Unspecified combined systolic (congestive) and diastolic (congestive) heart failure: Secondary | ICD-10-CM | POA: Diagnosis not present

## 2023-09-19 DIAGNOSIS — N189 Chronic kidney disease, unspecified: Secondary | ICD-10-CM | POA: Diagnosis not present

## 2023-09-19 DIAGNOSIS — I69351 Hemiplegia and hemiparesis following cerebral infarction affecting right dominant side: Secondary | ICD-10-CM | POA: Diagnosis not present

## 2023-09-19 DIAGNOSIS — Z515 Encounter for palliative care: Secondary | ICD-10-CM | POA: Diagnosis not present

## 2023-09-19 DIAGNOSIS — I130 Hypertensive heart and chronic kidney disease with heart failure and stage 1 through stage 4 chronic kidney disease, or unspecified chronic kidney disease: Secondary | ICD-10-CM | POA: Diagnosis not present

## 2023-09-19 DIAGNOSIS — Z6833 Body mass index (BMI) 33.0-33.9, adult: Secondary | ICD-10-CM | POA: Diagnosis not present

## 2023-09-19 DIAGNOSIS — D692 Other nonthrombocytopenic purpura: Secondary | ICD-10-CM | POA: Diagnosis not present

## 2023-09-19 DIAGNOSIS — K5900 Constipation, unspecified: Secondary | ICD-10-CM | POA: Diagnosis not present

## 2023-09-19 DIAGNOSIS — E1142 Type 2 diabetes mellitus with diabetic polyneuropathy: Secondary | ICD-10-CM | POA: Diagnosis not present

## 2023-09-21 DIAGNOSIS — E1142 Type 2 diabetes mellitus with diabetic polyneuropathy: Secondary | ICD-10-CM | POA: Diagnosis not present

## 2023-09-21 DIAGNOSIS — E785 Hyperlipidemia, unspecified: Secondary | ICD-10-CM | POA: Diagnosis not present

## 2023-09-21 DIAGNOSIS — Z794 Long term (current) use of insulin: Secondary | ICD-10-CM | POA: Diagnosis not present

## 2023-09-21 DIAGNOSIS — E1169 Type 2 diabetes mellitus with other specified complication: Secondary | ICD-10-CM | POA: Diagnosis not present

## 2023-09-21 DIAGNOSIS — E1159 Type 2 diabetes mellitus with other circulatory complications: Secondary | ICD-10-CM | POA: Diagnosis not present

## 2023-09-21 DIAGNOSIS — R809 Proteinuria, unspecified: Secondary | ICD-10-CM | POA: Diagnosis not present

## 2023-09-21 DIAGNOSIS — E1129 Type 2 diabetes mellitus with other diabetic kidney complication: Secondary | ICD-10-CM | POA: Diagnosis not present

## 2023-10-03 ENCOUNTER — Other Ambulatory Visit: Payer: Self-pay

## 2023-10-03 ENCOUNTER — Telehealth: Payer: Self-pay | Admitting: Cardiovascular Disease

## 2023-10-03 NOTE — Telephone Encounter (Signed)
Patient called to talk with Dr. Mariah Milling or nurse

## 2023-10-03 NOTE — Telephone Encounter (Signed)
Spoke to patient and he stated that he wanted know if his primary cardiologist would ok him to take sildenafil (Viagra). Spoke to Dr. Mariah Milling and he stated from a cardiac perspective he is clear to take sildenafil (Viagra) but he cannot take his nitroGLYCERIN (NITROSTAT) SL tablet 0.4 mg. Patient stated that he cannot remember the last time he took it and requested that it be taken from his medication list.

## 2023-10-10 DIAGNOSIS — I5042 Chronic combined systolic (congestive) and diastolic (congestive) heart failure: Secondary | ICD-10-CM | POA: Diagnosis not present

## 2023-10-10 DIAGNOSIS — I69398 Other sequelae of cerebral infarction: Secondary | ICD-10-CM | POA: Diagnosis not present

## 2023-10-10 DIAGNOSIS — Z9181 History of falling: Secondary | ICD-10-CM | POA: Diagnosis not present

## 2023-10-12 DIAGNOSIS — I10 Essential (primary) hypertension: Secondary | ICD-10-CM | POA: Diagnosis not present

## 2023-10-12 DIAGNOSIS — B356 Tinea cruris: Secondary | ICD-10-CM | POA: Diagnosis not present

## 2023-10-12 DIAGNOSIS — N1831 Chronic kidney disease, stage 3a: Secondary | ICD-10-CM | POA: Diagnosis not present

## 2023-10-12 DIAGNOSIS — E1151 Type 2 diabetes mellitus with diabetic peripheral angiopathy without gangrene: Secondary | ICD-10-CM | POA: Diagnosis not present

## 2023-10-12 DIAGNOSIS — Z23 Encounter for immunization: Secondary | ICD-10-CM | POA: Diagnosis not present

## 2023-10-12 DIAGNOSIS — R54 Age-related physical debility: Secondary | ICD-10-CM | POA: Diagnosis not present

## 2023-10-12 DIAGNOSIS — Z951 Presence of aortocoronary bypass graft: Secondary | ICD-10-CM | POA: Diagnosis not present

## 2023-10-12 DIAGNOSIS — Z794 Long term (current) use of insulin: Secondary | ICD-10-CM | POA: Diagnosis not present

## 2023-10-12 DIAGNOSIS — Z8701 Personal history of pneumonia (recurrent): Secondary | ICD-10-CM | POA: Diagnosis not present

## 2023-10-12 DIAGNOSIS — I4821 Permanent atrial fibrillation: Secondary | ICD-10-CM | POA: Diagnosis not present

## 2023-11-02 DIAGNOSIS — E785 Hyperlipidemia, unspecified: Secondary | ICD-10-CM | POA: Diagnosis not present

## 2023-11-02 DIAGNOSIS — E1159 Type 2 diabetes mellitus with other circulatory complications: Secondary | ICD-10-CM | POA: Diagnosis not present

## 2023-11-02 DIAGNOSIS — E1129 Type 2 diabetes mellitus with other diabetic kidney complication: Secondary | ICD-10-CM | POA: Diagnosis not present

## 2023-11-02 DIAGNOSIS — E1151 Type 2 diabetes mellitus with diabetic peripheral angiopathy without gangrene: Secondary | ICD-10-CM | POA: Diagnosis not present

## 2023-11-02 DIAGNOSIS — R809 Proteinuria, unspecified: Secondary | ICD-10-CM | POA: Diagnosis not present

## 2023-11-02 DIAGNOSIS — E1169 Type 2 diabetes mellitus with other specified complication: Secondary | ICD-10-CM | POA: Diagnosis not present

## 2023-11-02 DIAGNOSIS — Z794 Long term (current) use of insulin: Secondary | ICD-10-CM | POA: Diagnosis not present

## 2023-11-02 DIAGNOSIS — E1142 Type 2 diabetes mellitus with diabetic polyneuropathy: Secondary | ICD-10-CM | POA: Diagnosis not present

## 2023-11-09 DIAGNOSIS — Z9181 History of falling: Secondary | ICD-10-CM | POA: Diagnosis not present

## 2023-11-09 DIAGNOSIS — I69398 Other sequelae of cerebral infarction: Secondary | ICD-10-CM | POA: Diagnosis not present

## 2023-11-09 DIAGNOSIS — I5042 Chronic combined systolic (congestive) and diastolic (congestive) heart failure: Secondary | ICD-10-CM | POA: Diagnosis not present

## 2023-11-16 DIAGNOSIS — E1159 Type 2 diabetes mellitus with other circulatory complications: Secondary | ICD-10-CM | POA: Diagnosis not present

## 2023-11-16 DIAGNOSIS — E1151 Type 2 diabetes mellitus with diabetic peripheral angiopathy without gangrene: Secondary | ICD-10-CM | POA: Diagnosis not present

## 2023-11-16 DIAGNOSIS — E785 Hyperlipidemia, unspecified: Secondary | ICD-10-CM | POA: Diagnosis not present

## 2023-11-16 DIAGNOSIS — E1142 Type 2 diabetes mellitus with diabetic polyneuropathy: Secondary | ICD-10-CM | POA: Diagnosis not present

## 2023-11-16 DIAGNOSIS — R809 Proteinuria, unspecified: Secondary | ICD-10-CM | POA: Diagnosis not present

## 2023-11-16 DIAGNOSIS — E1169 Type 2 diabetes mellitus with other specified complication: Secondary | ICD-10-CM | POA: Diagnosis not present

## 2023-11-16 DIAGNOSIS — E1129 Type 2 diabetes mellitus with other diabetic kidney complication: Secondary | ICD-10-CM | POA: Diagnosis not present

## 2023-11-16 DIAGNOSIS — Z794 Long term (current) use of insulin: Secondary | ICD-10-CM | POA: Diagnosis not present

## 2024-01-12 ENCOUNTER — Telehealth: Payer: Self-pay | Admitting: Cardiovascular Disease

## 2024-01-12 NOTE — Telephone Encounter (Signed)
*  STAT* If patient is at the pharmacy, call can be transferred to refill team.   1. Which medications need to be refilled? (please list name of each medication and dose if known)  Nitroglycerin   2. Which pharmacy/location (including street and city if local pharmacy) is medication to be sent to? [facility] [address] [place] - [address]  3. Do they need a 30 day or 90 day supply?   Standard supply   Daughter states they accidentally washed nitroglycerin and it melted in the cycle. She would like to have a new supply sent to pharmacy.

## 2024-01-12 NOTE — Telephone Encounter (Signed)
 Please contact pt for future appointment. Pt due for 6 month f/u. Pt requesting refill.

## 2024-01-15 MED ORDER — NITROGLYCERIN 0.4 MG SL SUBL: 0.4000 mg | SUBLINGUAL_TABLET | SUBLINGUAL | 0 refills | Status: AC | PRN

## 2024-01-15 NOTE — Telephone Encounter (Signed)
Pt is scheduled on 4/29 

## 2024-01-15 NOTE — Telephone Encounter (Signed)
Requested Prescriptions   Signed Prescriptions Disp Refills   nitroGLYCERIN (NITROSTAT) 0.4 MG SL tablet 25 tablet 0    Sig: Place 1 tablet (0.4 mg total) under the tongue every 5 (five) minutes as needed for chest pain.    Authorizing Provider: GOLLAN, TIMOTHY J    Ordering User: LOPEZ, MARINA C    

## 2024-01-18 ENCOUNTER — Ambulatory Visit: Payer: PPO | Attending: Physician Assistant

## 2024-01-18 DIAGNOSIS — M6281 Muscle weakness (generalized): Secondary | ICD-10-CM | POA: Insufficient documentation

## 2024-01-18 DIAGNOSIS — I69398 Other sequelae of cerebral infarction: Secondary | ICD-10-CM | POA: Insufficient documentation

## 2024-01-18 DIAGNOSIS — R269 Unspecified abnormalities of gait and mobility: Secondary | ICD-10-CM | POA: Insufficient documentation

## 2024-01-18 DIAGNOSIS — R262 Difficulty in walking, not elsewhere classified: Secondary | ICD-10-CM | POA: Insufficient documentation

## 2024-01-18 DIAGNOSIS — R2689 Other abnormalities of gait and mobility: Secondary | ICD-10-CM | POA: Insufficient documentation

## 2024-01-18 NOTE — Therapy (Signed)
 OUTPATIENT PHYSICAL THERAPY NEURO EVALUATION   Patient Name: Joseph [name] MRN: [id number] DOB:August 12, 1925, 89 y.o., male Today's Date: 01/19/2024   PCP: Dr. Julieanne Manson REFERRING PROVIDER: Toya Smothers, PA  END OF SESSION:  PT End of Session - 01/19/24 0749     Visit Number 1    Number of Visits 24    Date for PT Re-Evaluation 04/11/24    Progress Note Due on Visit 10    PT Start Time 1401    PT Stop Time 1444    PT Time Calculation (min) 43 min    Equipment Utilized During Treatment Gait belt    Activity Tolerance Patient tolerated treatment well    Behavior During Therapy WFL for tasks assessed/performed             Past Medical History:  Diagnosis Date   Acute respiratory failure with hypoxia (HCC) 03/21/2016   Atrial fibrillation (HCC) 03/22/2023   CAD (coronary artery disease)    a. s/p CABG;  b. 07/2012 Cath: 3VD including 80 dLCX (med Rx), 4/4 patent grafts.   Chronic diastolic CHF (congestive heart failure) (HCC)    a. 07/2012 Echo: EF 55-60%, no rwma, Gr 1 DD, mild MR;  04/2014 Echo: EF 55-60%, no rwma, mild MR, mildly dil LA.   Diabetes mellitus, type 2 (HCC)    HTN (hypertension)    Hyperlipidemia    Hypothyroidism    Lower extremity edema    a. 03/2014 amlodipine d/c'd.   Spinal stenosis    Stroke (HCC)    1/24 and 5/24   Past Surgical History:  Procedure Laterality Date   APPENDECTOMY     CARDIAC CATHETERIZATION  07/2012   [facility]/no stents   CHOLECYSTECTOMY     CORNEAL TRANSPLANT     CORONARY ARTERY BYPASS GRAFT  1994   HEMORRHOID SURGERY     LEFT HEART CATH AND CORS/GRAFTS ANGIOGRAPHY N/A 12/17/2019   Procedure: LEFT HEART CATH AND CORS/GRAFTS ANGIOGRAPHY;  Surgeon: Yvonne Kendall, MD;  Location: [facility] INVASIVE CV LAB;  Service: Cardiovascular;  Laterality: N/A;   PROSTATE BIOPSY     TOE SURGERY     ingrown toe nail   Patient Active Problem List   Diagnosis Date Noted   Chronic anticoagulation 04/23/2023   Stroke (HCC) 04/06/2023    Type II diabetes mellitus with renal manifestations (HCC) 04/06/2023   Chronic kidney disease, stage 3a (HCC) 04/06/2023   COPD (chronic obstructive pulmonary disease) (HCC) 04/06/2023   Chronic diastolic CHF (congestive heart failure) (HCC) 04/06/2023   Permanent atrial fibrillation (HCC) 04/06/2023   Obesity (BMI 30-39.9) 04/06/2023   Acute cerebrovascular accident (CVA) (HCC) 12/09/2022   Chronic combined systolic and diastolic CHF (congestive heart failure) (HCC) 12/09/2022   Fall at home, initial encounter 12/09/2022   Hypokalemia 12/09/2022   Hemiparesis affecting right side as late effect of cerebrovascular accident (CVA) (HCC) 12/09/2022   Skin tear of left hand without complication 11/16/2022   Pain of left hip 11/16/2022   Left hip pain 11/10/2022   Intertrigo 11/10/2022   COPD with acute exacerbation (HCC) 10/06/2022   Diabetes mellitus (HCC) 06/10/2022   Enlarged prostate 03/02/2022   Other specified counseling 03/02/2022   Renal calculi 03/02/2022   Gastroesophageal reflux disease 03/02/2022   Hypertensive disorder 03/02/2022   Mixed hyperlipidemia 03/02/2022   Hypothyroidism 03/02/2022   Acute non-recurrent frontal sinusitis 01/05/2022   Glands swollen 01/05/2022   Hypertension associated with diabetes (HCC) 01/05/2022   Acute rhinitis 01/05/2022   AV block, Mobitz 1  NSTEMI (non-ST elevated myocardial infarction) [facility])    ACS (acute coronary syndrome) (HCC) 12/16/2019   Diverticulitis of large intestine without perforation or abscess without bleeding    Pain of upper abdomen    SOB (shortness of breath)    S/P CABG (coronary artery bypass graft)    Back pain, chronic 07/28/2015   Benign fibroma of prostate 07/28/2015   Acid reflux 07/28/2015   Adult hypothyroidism 07/28/2015   Neuropathy 07/28/2015   Arthritis, degenerative 07/28/2015   Psoriasis 07/28/2015   Lumbar spondylosis 05/08/2015   Obesity 09/09/2014   Bilateral leg weakness 09/09/2014   Acute  combined systolic and diastolic heart failure (HCC) 05/13/2014   Bilateral leg edema 04/15/2014   Bradycardia 08/13/2013   Edema 06/21/2012   Weakness 09/15/2011   Type 2 diabetes mellitus with diabetic peripheral angiopathy without gangrene, with long-term current use of insulin (HCC) 09/15/2011   DYSPNEA 06/09/2009   Hyperlipidemia 06/07/2009   Essential hypertension 06/07/2009   Coronary artery disease 06/07/2009    ONSET DATE: Jan 2024  REFERRING DIAG:  R26.81 (ICD-10-CM) - Gait instability  R26.89 (ICD-10-CM) - Decreased mobility    THERAPY DIAG:  Abnormality of gait - Plan: PT plan of care cert/re-cert  Difficulty walking - Plan: PT plan of care cert/re-cert  Muscle weakness (generalized) - Plan: PT plan of care cert/re-cert  Imbalance due to old stroke - Plan: PT plan of care cert/re-cert  Rationale for Evaluation and Treatment: Rehabilitation  SUBJECTIVE:                                                                                                                                                                                             SUBJECTIVE STATEMENT: Patient reports having 2 CVA last year (1 CVA affecting right and 1 affecting left side). Prior to CVA's he reports he was walking normally without an AD. States has been walking with walker ever since.    Pt accompanied by:  Paid caregiver  PERTINENT HISTORY: [facility]- Patient is a 89 y/o veteran presenting with primary c/o difficulty in walking. PMH significant for CVA in Jan 2024 and in May 2024 with some residual R sided weakness.   PAIN:  Are you having pain? No  PRECAUTIONS: Fall  RED FLAGS: None   WEIGHT BEARING RESTRICTIONS: No  FALLS: Has patient fallen in last 6 months? Yes. Number of falls 1  LIVING ENVIRONMENT: Lives with: lives alone and has private  caregivers - everyday  and [facility] caregivers -3x/week - approx 8 hours/day Lives in: House/apartment Stairs:  has ramp  supplied by [place] Has following equipment at home:  Single point cane, Walker - 2 wheeled, Wheelchair (manual), Shower bench, bed side commode, Grab bars, Ramped entry, and Transport w/c  PLOF: Independent  PATIENT GOALS: I want to walk without walker independently  OBJECTIVE:  Note: Objective measures were completed at Evaluation unless otherwise noted.  DIAGNOSTIC FINDINGS: CLINICAL DATA:  Stroke, follow up   EXAM: CT HEAD WITHOUT CONTRAST   TECHNIQUE: Contiguous axial images were obtained from the base of the skull through the vertex without intravenous contrast.   RADIATION DOSE REDUCTION: This exam was performed according to the departmental dose-optimization program which includes automated exposure control, adjustment of the mA and/or kV according to patient size and/or use of iterative reconstruction technique.   COMPARISON:  MR Brain 04/06/23, CT head 04/06/23   FINDINGS: Brain: No hemorrhage. No hydrocephalus. There are prominent subarachnoid spaces bilaterally, unchanged. There is sequela of moderate chronic microvascular ischemic change with a chronic infarct in the left corona radiata and an acute infarct in the right basal ganglia, better characterized on recent brain MRI.   Vascular: No hyperdense vessel or unexpected calcification.   Skull: Normal. Negative for fracture or focal lesion.   Sinuses/Orbits: No mastoid or middle ear effusion. Paranasal sinuses are clear. Bilateral lens replacement. Orbits are otherwise unremarkable.   Other: None.   IMPRESSION: 1. No acute intracranial hemorrhage. 2. Acute infarct in the right basal ganglia, better characterized on recent brain MRI.     Electronically Signed   By: Lorenza Cambridge M.D.   On: 04/07/2023 16:08  COGNITION: Overall cognitive status: Within functional limits for tasks assessed   SENSATION: WFL  COORDINATION: WNL  EDEMA:  Some edema noted with lower left LE    POSTURE: rounded shoulders  and forward head  LOWER EXTREMITY ROM:     Active  Right Eval Left Eval  Hip flexion    Hip extension    Hip abduction    Hip adduction    Hip internal rotation    Hip external rotation    Knee flexion    Knee extension    Ankle dorsiflexion    Ankle plantarflexion    Ankle inversion    Ankle eversion     (Blank rows = not tested)  LOWER EXTREMITY MMT:    MMT Right Eval Left Eval  Hip flexion 4 4  Hip extension    Hip abduction    Hip adduction    Hip internal rotation 4 4  Hip external rotation 4 4  Knee flexion 4 4  Knee extension 4 4  Ankle dorsiflexion 4 4  Ankle plantarflexion    Ankle inversion    Ankle eversion    (Blank rows = not tested)  BED MOBILITY:  NT but both patient and caregiver report no difficulty with bed mobility  TRANSFERS: Assistive device utilized: Environmental consultant - 2 wheeled  Sit to stand: CGA Stand to sit: CGA Chair to chair: CGA Floor:  not tested  GAIT: Gait pattern: decreased step length- Right and decreased step length- Left Distance walked: > 100 feet  Assistive device utilized: Walker - 2 wheeled Level of assistance: CGA Comments: anterior trunk lean  FUNCTIONAL TESTS:  5 times sit to stand: 22.32 sec without UE support Timed up and go (TUG): 27.94 sec  6 minute walk test: To be assessed visit # 2 10 meter walk test: 16.10 and 14.92 sec with RW=15.51 sec avg or 0.65 m/s using RW  Berg Balance Scale: To be assessed visit #2  PATIENT SURVEYS:  Stroke  Impact Scale 52 ABC scale 37.5 %                                                                                                                              TREATMENT DATE: 01/18/2024 PT EVALUATION ONLY TODAY    PATIENT EDUCATION: Education details: Purpose of PT; purpose of functional outcome measures Person educated: Patient Education method: Explanation Education comprehension: verbalized understanding  HOME EXERCISE PROGRAM: To be initiated second  visit  GOALS: Goals reviewed with patient? Yes  SHORT TERM GOALS: Target date: 02/29/2024  Pt will be independent with HEP in order to improve strength and balance in order to decrease fall risk and improve function at home and work.  Baseline: EVAL: no formal HEP in place Goal status: INITIAL   LONG TERM GOALS: Target date: 04/11/2024  1.  Patient (> 43 years old) will complete five times sit to stand test in < 15 seconds indicating an increased LE strength and improved balance. Baseline: EVAL: 22.32 sec without UE support Goal status: INITIAL  2.  Pt will improve ABC by at least 13% in order to demonstrate clinically significant improvement in balance confidence Baseline: EVAL: 37.5% Goal status: INITIAL   3.  Patient will increase Berg Balance score by > 6 points to demonstrate decreased fall risk during functional activities. Baseline: EVAL: To be assessed visit #2 Goal status: INITIAL   4.   Patient will reduce timed up and go to <11 seconds to reduce fall risk and demonstrate improved transfer/gait ability. Baseline: EVAL: 27.94 with RW Goal status: INITIAL  5.   Patient will increase 10 meter walk test to >1.64m/s as to improve gait speed for better community ambulation and to reduce fall risk. Baseline: EVAL: 0.65 m/s Goal status: INITIAL  6.   Patient will increase six minute walk test distance to >1000 for progression to community ambulator and improve gait ability Baseline: EVAL: To be assessed  visit #2 Goal status: INITIAL  7.  Patient will improve score on Stroke Impact Scale 16 by 8 points to signify a higher level of functioning and less difficulty with daily activities.  Baseline: EVAL: 52 Goal status: INITIAL   ASSESSMENT:  CLINICAL IMPRESSION: Patient is a 89 y.o. male who was seen today for physical therapy evaluation and treatment for gait instability and difficulty walking. Patient presents with residual weakness from having 2 CVAs last year. He  presents today with increased bilateral LE muscle weakness, impaired functional mobiltiy and increased fall risk as seen by decreased scores with 5 time Sit to Stand, 10 MWT, and TUG today. He will benefit from skilled PT services to improve his overall functional strength and walking to enhance his independence and decrease his risk of falling.   OBJECTIVE IMPAIRMENTS: Abnormal gait, decreased activity tolerance, decreased balance, decreased endurance, decreased mobility, and decreased strength.   ACTIVITY LIMITATIONS: carrying, lifting, bending, standing, squatting, and stairs  PARTICIPATION LIMITATIONS: cleaning, laundry, driving, shopping, community  activity, and yard work  PERSONAL FACTORS: Age and 3+ comorbidities: HTN, CHF, A-FIB, DM  are also affecting patient's functional outcome.   REHAB POTENTIAL: Good  CLINICAL DECISION MAKING: Evolving/moderate complexity  EVALUATION COMPLEXITY: Moderate  PLAN:  PT FREQUENCY: 1-2x/week  PT DURATION: 12 weeks  PLANNED INTERVENTIONS: 97164- PT Re-evaluation, 97110-Therapeutic exercises, 97530- Therapeutic activity, [id number]- Neuromuscular re-education, 97535- Self Care, 40981- Manual therapy, [id number]- Gait training, [id number]- Canalith repositioning, [phone]- Electrical stimulation (manual), Patient/Family education, Balance training, Stair training, Dry Needling, Joint mobilization, Vestibular training, DME instructions, Cryotherapy, and Moist heat  PLAN FOR NEXT SESSION: Perform 6 min walk test, BERG test, and implement therex/theract/gait training, NMR, and education in the HEP.    Lenda Kelp, PT 01/19/2024, 8:02 AM

## 2024-01-22 ENCOUNTER — Ambulatory Visit: Payer: PPO

## 2024-01-24 ENCOUNTER — Ambulatory Visit: Payer: PPO

## 2024-01-25 ENCOUNTER — Ambulatory Visit: Payer: PPO | Attending: Physician Assistant | Admitting: Physical Therapy

## 2024-01-25 DIAGNOSIS — R2689 Other abnormalities of gait and mobility: Secondary | ICD-10-CM | POA: Diagnosis present

## 2024-01-25 DIAGNOSIS — R262 Difficulty in walking, not elsewhere classified: Secondary | ICD-10-CM | POA: Insufficient documentation

## 2024-01-25 DIAGNOSIS — R2681 Unsteadiness on feet: Secondary | ICD-10-CM | POA: Insufficient documentation

## 2024-01-25 DIAGNOSIS — R269 Unspecified abnormalities of gait and mobility: Secondary | ICD-10-CM | POA: Diagnosis present

## 2024-01-25 DIAGNOSIS — M6281 Muscle weakness (generalized): Secondary | ICD-10-CM | POA: Diagnosis present

## 2024-01-25 DIAGNOSIS — R278 Other lack of coordination: Secondary | ICD-10-CM | POA: Insufficient documentation

## 2024-01-25 DIAGNOSIS — I69398 Other sequelae of cerebral infarction: Secondary | ICD-10-CM | POA: Diagnosis present

## 2024-01-25 NOTE — Therapy (Signed)
 OUTPATIENT PHYSICAL THERAPY NEURO TREATMENT   Patient Name: Joseph [name] MRN: [id number] DOB:10/25/1925, 89 y.o., male Today's Date: 01/25/2024   PCP: Dr. Julieanne Manson REFERRING PROVIDER: Toya Smothers, PA  END OF SESSION:    PT End of Session - 01/25/24 1453     Visit Number 2    Number of Visits 24    Date for PT Re-Evaluation 04/11/24    Progress Note Due on Visit 10    PT Start Time 1454    PT Stop Time 1537    PT Time Calculation (min) 43 min    Equipment Utilized During Treatment Gait belt    Activity Tolerance Patient tolerated treatment well    Behavior During Therapy WFL for tasks assessed/performed              Past Medical History:  Diagnosis Date   Acute respiratory failure with hypoxia (HCC) 03/21/2016   Atrial fibrillation (HCC) 03/22/2023   CAD (coronary artery disease)    a. s/p CABG;  b. 07/2012 Cath: 3VD including 80 dLCX (med Rx), 4/4 patent grafts.   Chronic diastolic CHF (congestive heart failure) (HCC)    a. 07/2012 Echo: EF 55-60%, no rwma, Gr 1 DD, mild MR;  04/2014 Echo: EF 55-60%, no rwma, mild MR, mildly dil LA.   Diabetes mellitus, type 2 (HCC)    HTN (hypertension)    Hyperlipidemia    Hypothyroidism    Lower extremity edema    a. 03/2014 amlodipine d/c'd.   Spinal stenosis    Stroke (HCC)    1/24 and 5/24   Past Surgical History:  Procedure Laterality Date   APPENDECTOMY     CARDIAC CATHETERIZATION  07/2012   [facility]/no stents   CHOLECYSTECTOMY     CORNEAL TRANSPLANT     CORONARY ARTERY BYPASS GRAFT  1994   HEMORRHOID SURGERY     LEFT HEART CATH AND CORS/GRAFTS ANGIOGRAPHY N/A 12/17/2019   Procedure: LEFT HEART CATH AND CORS/GRAFTS ANGIOGRAPHY;  Surgeon: Yvonne Kendall, MD;  Location: [facility] INVASIVE CV LAB;  Service: Cardiovascular;  Laterality: N/A;   PROSTATE BIOPSY     TOE SURGERY     ingrown toe nail   Patient Active Problem List   Diagnosis Date Noted   Chronic anticoagulation 04/23/2023   Stroke (HCC) 04/06/2023    Type II diabetes mellitus with renal manifestations (HCC) 04/06/2023   Chronic kidney disease, stage 3a (HCC) 04/06/2023   COPD (chronic obstructive pulmonary disease) (HCC) 04/06/2023   Chronic diastolic CHF (congestive heart failure) (HCC) 04/06/2023   Permanent atrial fibrillation (HCC) 04/06/2023   Obesity (BMI 30-39.9) 04/06/2023   Acute cerebrovascular accident (CVA) (HCC) 12/09/2022   Chronic combined systolic and diastolic CHF (congestive heart failure) (HCC) 12/09/2022   Fall at home, initial encounter 12/09/2022   Hypokalemia 12/09/2022   Hemiparesis affecting right side as late effect of cerebrovascular accident (CVA) (HCC) 12/09/2022   Skin tear of left hand without complication 11/16/2022   Pain of left hip 11/16/2022   Left hip pain 11/10/2022   Intertrigo 11/10/2022   COPD with acute exacerbation (HCC) 10/06/2022   Diabetes mellitus (HCC) 06/10/2022   Enlarged prostate 03/02/2022   Other specified counseling 03/02/2022   Renal calculi 03/02/2022   Gastroesophageal reflux disease 03/02/2022   Hypertensive disorder 03/02/2022   Mixed hyperlipidemia 03/02/2022   Hypothyroidism 03/02/2022   Acute non-recurrent frontal sinusitis 01/05/2022   Glands swollen 01/05/2022   Hypertension associated with diabetes (HCC) 01/05/2022   Acute rhinitis 01/05/2022   AV  block, Mobitz 1    NSTEMI (non-ST elevated myocardial infarction) (HCC)    ACS (acute coronary syndrome) (HCC) 12/16/2019   Diverticulitis of large intestine without perforation or abscess without bleeding    Pain of upper abdomen    SOB (shortness of breath)    S/P CABG (coronary artery bypass graft)    Back pain, chronic 07/28/2015   Benign fibroma of prostate 07/28/2015   Acid reflux 07/28/2015   Adult hypothyroidism 07/28/2015   Neuropathy 07/28/2015   Arthritis, degenerative 07/28/2015   Psoriasis 07/28/2015   Lumbar spondylosis 05/08/2015   Obesity 09/09/2014   Bilateral leg weakness 09/09/2014   Acute  combined systolic and diastolic heart failure (HCC) 05/13/2014   Bilateral leg edema 04/15/2014   Bradycardia 08/13/2013   Edema 06/21/2012   Weakness 09/15/2011   Type 2 diabetes mellitus with diabetic peripheral angiopathy without gangrene, with long-term current use of insulin (HCC) 09/15/2011   DYSPNEA 06/09/2009   Hyperlipidemia 06/07/2009   Essential hypertension 06/07/2009   Coronary artery disease 06/07/2009    ONSET DATE: Jan 2024  REFERRING DIAG:  R26.81 (ICD-10-CM) - Gait instability  R26.89 (ICD-10-CM) - Decreased mobility    THERAPY DIAG:  Abnormality of gait  Difficulty walking  Muscle weakness (generalized)  Imbalance due to old stroke  Rationale for Evaluation and Treatment: Rehabilitation  SUBJECTIVE:                                                                                                                                                                                             SUBJECTIVE STATEMENT:  Pt reports he is "doing alright." Pt reports "the reason I'm here is because I want to learn how to walk without a walker and walk by myself normally." Pt reports he never walks without the walker, unless he is with a medical professional. Pt reports he walked 2109ft without walker at [place] the other day. Pt denies any pain. Denies any stumbles/falls since last visit.  From Eval: Patient reports having 2 CVA last year (1 CVA affecting right and 1 affecting left side). Prior to CVA's he reports he was walking normally without an AD. States has been walking with walker ever since.    Pt accompanied by:  Paid caregiver  PERTINENT HISTORY: [facility]24- Patient is a 89 y/o veteran presenting with primary c/o difficulty in walking. PMH significant for CVA in Jan 2024 and in May 2024 with some residual R sided weakness.   PAIN:  Are you having pain? No  PRECAUTIONS: Fall  RED FLAGS: None   WEIGHT BEARING RESTRICTIONS: No  FALLS: Has  patient  fallen in last 6 months? Yes. Number of falls 1  LIVING ENVIRONMENT: Lives with: lives alone and has private  caregivers - everyday  and [facility] caregivers -3x/week - approx 8 hours/day Lives in: House/apartment Stairs:  has ramp supplied by [place] Has following equipment at home: Single point cane, Environmental consultant - 2 wheeled, Wheelchair (manual), Shower bench, bed side commode, Grab bars, Ramped entry, and Transport w/c  PLOF: Independent  PATIENT GOALS: I want to walk without walker independently  OBJECTIVE:  Note: Objective measures were completed at Evaluation unless otherwise noted.  DIAGNOSTIC FINDINGS: CLINICAL DATA:  Stroke, follow up   EXAM: CT HEAD WITHOUT CONTRAST   TECHNIQUE: Contiguous axial images were obtained from the base of the skull through the vertex without intravenous contrast.   RADIATION DOSE REDUCTION: This exam was performed according to the departmental dose-optimization program which includes automated exposure control, adjustment of the mA and/or kV according to patient size and/or use of iterative reconstruction technique.   COMPARISON:  MR Brain 04/06/23, CT head 04/06/23   FINDINGS: Brain: No hemorrhage. No hydrocephalus. There are prominent subarachnoid spaces bilaterally, unchanged. There is sequela of moderate chronic microvascular ischemic change with a chronic infarct in the left corona radiata and an acute infarct in the right basal ganglia, better characterized on recent brain MRI.   Vascular: No hyperdense vessel or unexpected calcification.   Skull: Normal. Negative for fracture or focal lesion.   Sinuses/Orbits: No mastoid or middle ear effusion. Paranasal sinuses are clear. Bilateral lens replacement. Orbits are otherwise unremarkable.   Other: None.   IMPRESSION: 1. No acute intracranial hemorrhage. 2. Acute infarct in the right basal ganglia, better characterized on recent brain MRI.     Electronically Signed   By: Lorenza Cambridge  M.D.   On: 04/07/2023 16:08  COGNITION: Overall cognitive status: Within functional limits for tasks assessed   SENSATION: WFL  COORDINATION: WNL  EDEMA:  Some edema noted with lower left LE    POSTURE: rounded shoulders and forward head  LOWER EXTREMITY ROM:     Active  Right Eval Left Eval  Hip flexion    Hip extension    Hip abduction    Hip adduction    Hip internal rotation    Hip external rotation    Knee flexion    Knee extension    Ankle dorsiflexion    Ankle plantarflexion    Ankle inversion    Ankle eversion     (Blank rows = not tested)  LOWER EXTREMITY MMT:    MMT Right Eval Left Eval  Hip flexion 4 4  Hip extension    Hip abduction    Hip adduction    Hip internal rotation 4 4  Hip external rotation 4 4  Knee flexion 4 4  Knee extension 4 4  Ankle dorsiflexion 4 4  Ankle plantarflexion    Ankle inversion    Ankle eversion    (Blank rows = not tested)  BED MOBILITY:  NT but both patient and caregiver report no difficulty with bed mobility  TRANSFERS: Assistive device utilized: Environmental consultant - 2 wheeled  Sit to stand: CGA Stand to sit: CGA Chair to chair: CGA Floor:  not tested  GAIT: Gait pattern: decreased step length- Right and decreased step length- Left Distance walked: > 100 feet  Assistive device utilized: Walker - 2 wheeled Level of assistance: CGA Comments: anterior trunk lean  FUNCTIONAL TESTS:  5 times sit to stand: 22.32 sec without UE support Timed  up and go (TUG): 27.94 sec  6 minute walk test: To be assessed visit # 2 10 meter walk test: 16.10 and 14.92 sec with RW=15.51 sec avg or 0.65 m/s using RW  Berg Balance Scale: To be assessed visit #2  PATIENT SURVEYS:  Stroke Impact Scale 52 ABC scale 37.5 %                                                                                                                              TREATMENT DATE: 01/25/24  Unless otherwise stated, CGA was provided and gait belt donned  in order to ensure pt safety throughout session.  Patient participated in [employer] and demonstrates increased fall risk as noted by score of  25/56.  (<36= high risk for falls, close to 100%; 37-45 significant >80%; 46-51 moderate >50%; 52-55 lower >25%). Pt requires frequent seated rest breaks throughout the test. Educated pt on results of test and high fall risk with recommendation to use RW for all mobility at this time.    [facility] PT Assessment - 01/25/24 0001       Berg Balance Test   Sit to Stand Able to stand  independently using hands    Standing Unsupported Able to stand 2 minutes with supervision   very wide BOS   Sitting with Back Unsupported but Feet Supported on Floor or Stool Able to sit safely and securely 2 minutes    Stand to Sit Uses backs of legs against chair to control descent    Transfers Able to transfer with verbal cueing and /or supervision    Standing Unsupported with Eyes Closed Able to stand 10 seconds with supervision   very wide BOS   Standing Unsupported with Feet Together Able to place feet together independently and stand for 1 minute with supervision    From Standing, Reach Forward with Outstretched Arm Reaches forward but needs supervision   noticeable use of ankle strategy to maintain balance   From Standing Position, Pick up Object from Floor Able to pick up shoe, needs supervision    From Standing Position, Turn to Look Behind Over each Shoulder Needs assist to keep from losing balance and falling    Turn 360 Degrees Needs assistance while turning    Standing Unsupported, Alternately Place Feet on Step/Stool Needs assistance to keep from falling or unable to try    Standing Unsupported, One Foot in Front Needs help to step but can hold 15 seconds    Standing on One Leg Unable to try or needs assist to prevent fall    Total Score 25             Pt reports at home he already performs standing exercises at the kitchen sink including: squats,  marching, heel raises, hip extension, and hip abduction   Reports he does have ankle weights he uses for his exercises as well.  Made the following modifications to patient's exercise program:  Changed standing squats to sit<>stands at counter to promote pt moving through a greater ROM for increased muscle activation (pt reports this as being a more challenging exercise) Added standing narrow BOS  Added standing narrow BOS with slow vertical head nods with caregiver guarding him *pt performed 1 set of these exercises as well as demonstrated proper form for the other exercises he performs at baseline to ensure pt safety *Therapist providing cuing/education for proper form/technique and safe set-up of home environment  Provided pt with HEP printout.   Gait training ~176ft, no UE support, with min A for balance due to minor anterior lean bias. Pt has arms in guarded posture, wide BOS, min reciprocal stepping pattern, and increased postural instability throughout.   PATIENT EDUCATION: Education details: Purpose of PT; purpose of functional outcome measures Person educated: Patient Education method: Explanation Education comprehension: verbalized understanding  HOME EXERCISE PROGRAM:  Access Code: [email or web address] URL: [email or web address] Date: 01/25/2024 Prepared by: Casimiro Needle  Exercises - Sit to Stand with Counter Support  - 1 x daily - 7 x weekly - 2 sets - 10 reps - Narrow Stance with Counter Support  - 1 x daily - 7 x weekly - 2 sets - 30 seconds hold - Narrow Stance with Head Nods and Counter Support  - 1 x daily - 7 x weekly - 2 sets - 10 reps   GOALS: Goals reviewed with patient? Yes  SHORT TERM GOALS: Target date: 02/29/2024  Pt will be independent with HEP in order to improve strength and balance in order to decrease fall risk and improve function at home and work.  Baseline: EVAL: no formal HEP in place Goal status: INITIAL   LONG TERM GOALS: Target date:  04/11/2024  1.  Patient (> 65 years old) will complete five times sit to stand test in < 15 seconds indicating an increased LE strength and improved balance. Baseline: EVAL: 22.32 sec without UE support Goal status: INITIAL  2.  Pt will improve ABC by at least 13% in order to demonstrate clinically significant improvement in balance confidence Baseline: EVAL: 37.5% Goal status: INITIAL   3.  Patient will increase Berg Balance score by > 6 points to demonstrate decreased fall risk during functional activities. Baseline: 01/25/2024: 25/56 Goal status: INITIAL   4.   Patient will reduce timed up and go to <11 seconds to reduce fall risk and demonstrate improved transfer/gait ability. Baseline: EVAL: 27.94 with RW Goal status: INITIAL  5.   Patient will increase 10 meter walk test to >1.77m/s as to improve gait speed for better community ambulation and to reduce fall risk. Baseline: EVAL: 0.65 m/s Goal status: INITIAL  6.   Patient will increase six minute walk test distance to >1000 for progression to community ambulator and improve gait ability Baseline: EVAL: To be assessed  visit #2 Goal status: INITIAL  7.  Patient will improve score on Stroke Impact Scale 16 by 8 points to signify a higher level of functioning and less difficulty with daily activities.  Baseline: EVAL: 52 Goal status: INITIAL   ASSESSMENT:  CLINICAL IMPRESSION:  Patient is a 89 y.o. male who was seen today for physical therapy treatment for impaired balance, gait instability, and difficulty walking. Pt demonstrates significant increased risk for falling based on Berg Balance Test with pt having greatest challenge with items that require narrow BOS, eyes closed, reaching, turning, and alternating foot taps. Provided pt with HEP to include exercise targeting narrow BOS impairment. Participated in gait training  without AD and pt demos wide BOS, increased postural instability, and anterior trunk lean bias. Mr. Oquinn  will benefit from skilled PT services to improve his overall functional strength and walking to enhance his independence and decrease his risk of falling.   OBJECTIVE IMPAIRMENTS: Abnormal gait, decreased activity tolerance, decreased balance, decreased endurance, decreased mobility, and decreased strength.   ACTIVITY LIMITATIONS: carrying, lifting, bending, standing, squatting, and stairs  PARTICIPATION LIMITATIONS: cleaning, laundry, driving, shopping, community activity, and yard work  PERSONAL FACTORS: Age and 3+ comorbidities: HTN, CHF, A-FIB, DM  are also affecting patient's functional outcome.   REHAB POTENTIAL: Good  CLINICAL DECISION MAKING: Evolving/moderate complexity  EVALUATION COMPLEXITY: Moderate  PLAN:  PT FREQUENCY: 1-2x/week  PT DURATION: 12 weeks  PLANNED INTERVENTIONS: 97164- PT Re-evaluation, 97110-Therapeutic exercises, 97530- Therapeutic activity, [id number]- Neuromuscular re-education, 97535- Self Care, [id number]- Manual therapy, [id number]- Gait training, [phone]- Canalith repositioning, [phone]- Electrical stimulation (manual), Patient/Family education, Balance training, Stair training, Dry Needling, Joint mobilization, Vestibular training, DME instructions, Cryotherapy, and Moist heat  PLAN FOR NEXT SESSION:  - 6 min walk test  - review new HEP - balance interventions including: narrow BOS, reaching/turning/rotating, eyes closed, and alternating foot taps - gait training without AD - implement therex/theract/gait training  Casimiro Needle, PT, DPT, NCS, CSRS Physical Therapist - [place]  [facility]  6:05 PM 01/25/24

## 2024-01-29 ENCOUNTER — Ambulatory Visit: Payer: PPO | Admitting: Physical Therapy

## 2024-01-29 ENCOUNTER — Ambulatory Visit: Payer: PPO

## 2024-01-29 DIAGNOSIS — R269 Unspecified abnormalities of gait and mobility: Secondary | ICD-10-CM

## 2024-01-29 DIAGNOSIS — R262 Difficulty in walking, not elsewhere classified: Secondary | ICD-10-CM

## 2024-01-29 DIAGNOSIS — I69398 Other sequelae of cerebral infarction: Secondary | ICD-10-CM

## 2024-01-29 DIAGNOSIS — M6281 Muscle weakness (generalized): Secondary | ICD-10-CM

## 2024-01-29 NOTE — Therapy (Signed)
 OUTPATIENT PHYSICAL THERAPY NEURO TREATMENT   Patient Name: BARTOW ZYLSTRA MRN: [id number] DOB:1924-12-04, 89 y.o., male Today's Date: 01/29/2024   PCP: Dr. Julieanne Manson REFERRING PROVIDER: Toya Smothers, PA  END OF SESSION:    PT End of Session - 01/29/24 1100     Visit Number 3    Number of Visits 24    Date for PT Re-Evaluation 04/11/24    Progress Note Due on Visit 10    PT Start Time 1102    PT Stop Time 1146    PT Time Calculation (min) 44 min    Equipment Utilized During Treatment Gait belt    Activity Tolerance Patient tolerated treatment well    Behavior During Therapy WFL for tasks assessed/performed               Past Medical History:  Diagnosis Date   Acute respiratory failure with hypoxia (HCC) 03/21/2016   Atrial fibrillation (HCC) 03/22/2023   CAD (coronary artery disease)    a. s/p CABG;  b. 07/2012 Cath: 3VD including 80 dLCX (med Rx), 4/4 patent grafts.   Chronic diastolic CHF (congestive heart failure) (HCC)    a. 07/2012 Echo: EF 55-60%, no rwma, Gr 1 DD, mild MR;  04/2014 Echo: EF 55-60%, no rwma, mild MR, mildly dil LA.   Diabetes mellitus, type 2 (HCC)    HTN (hypertension)    Hyperlipidemia    Hypothyroidism    Lower extremity edema    a. 03/2014 amlodipine d/c'd.   Spinal stenosis    Stroke (HCC)    1/24 and 5/24   Past Surgical History:  Procedure Laterality Date   APPENDECTOMY     CARDIAC CATHETERIZATION  07/2012   [facility]/no stents   CHOLECYSTECTOMY     CORNEAL TRANSPLANT     CORONARY ARTERY BYPASS GRAFT  1994   HEMORRHOID SURGERY     LEFT HEART CATH AND CORS/GRAFTS ANGIOGRAPHY N/A 12/17/2019   Procedure: LEFT HEART CATH AND CORS/GRAFTS ANGIOGRAPHY;  Surgeon: Yvonne Kendall, MD;  Location: [facility] INVASIVE CV [facility];  Service: Cardiovascular;  Laterality: N/A;   PROSTATE BIOPSY     TOE SURGERY     ingrown toe nail   Patient Active Problem List   Diagnosis Date Noted   Chronic anticoagulation 04/23/2023   Stroke (HCC) 04/06/2023    Type II diabetes mellitus with renal manifestations (HCC) 04/06/2023   Chronic kidney disease, stage 3a (HCC) 04/06/2023   COPD (chronic obstructive pulmonary disease) (HCC) 04/06/2023   Chronic diastolic CHF (congestive heart failure) (HCC) 04/06/2023   Permanent atrial fibrillation (HCC) 04/06/2023   Obesity (BMI 30-39.9) 04/06/2023   Acute cerebrovascular accident (CVA) (HCC) 12/09/2022   Chronic combined systolic and diastolic CHF (congestive heart failure) (HCC) 12/09/2022   Fall at home, initial encounter 12/09/2022   Hypokalemia 12/09/2022   Hemiparesis affecting right side as late effect of cerebrovascular accident (CVA) (HCC) 12/09/2022   Skin tear of left hand without complication 11/16/2022   Pain of left hip 11/16/2022   Left hip pain 11/10/2022   Intertrigo 11/10/2022   COPD with acute exacerbation (HCC) 10/06/2022   Diabetes mellitus (HCC) 06/10/2022   Enlarged prostate 03/02/2022   Other specified counseling 03/02/2022   Renal calculi 03/02/2022   Gastroesophageal reflux disease 03/02/2022   Hypertensive disorder 03/02/2022   Mixed hyperlipidemia 03/02/2022   Hypothyroidism 03/02/2022   Acute non-recurrent frontal sinusitis 01/05/2022   Glands swollen 01/05/2022   Hypertension associated with diabetes (HCC) 01/05/2022   Acute rhinitis 01/05/2022  AV block, Mobitz 1    NSTEMI (non-ST elevated myocardial infarction) (HCC)    ACS (acute coronary syndrome) (HCC) 12/16/2019   Diverticulitis of large intestine without perforation or abscess without bleeding    Pain of upper abdomen    SOB (shortness of breath)    S/P CABG (coronary artery bypass graft)    Back pain, chronic 07/28/2015   Benign fibroma of prostate 07/28/2015   Acid reflux 07/28/2015   Adult hypothyroidism 07/28/2015   Neuropathy 07/28/2015   Arthritis, degenerative 07/28/2015   Psoriasis 07/28/2015   Lumbar spondylosis 05/08/2015   Obesity 09/09/2014   Bilateral leg weakness 09/09/2014   Acute  combined systolic and diastolic heart failure (HCC) 05/13/2014   Bilateral leg edema 04/15/2014   Bradycardia 08/13/2013   Edema 06/21/2012   Weakness 09/15/2011   Type 2 diabetes mellitus with diabetic peripheral angiopathy without gangrene, with long-term current use of insulin (HCC) 09/15/2011   DYSPNEA 06/09/2009   Hyperlipidemia 06/07/2009   Essential hypertension 06/07/2009   Coronary artery disease 06/07/2009    ONSET DATE: Jan 2024  REFERRING DIAG:  R26.81 (ICD-10-CM) - Gait instability  R26.89 (ICD-10-CM) - Decreased mobility    THERAPY DIAG:  Abnormality of gait  Muscle weakness (generalized)  Difficulty walking  Imbalance due to old stroke  Rationale for Evaluation and Treatment: Rehabilitation  SUBJECTIVE:                                                                                                                                                                                             SUBJECTIVE STATEMENT:  Pt reports doing good today. States he has been doing his exercises at home. Reports they are feeling a little more helpful than his baseline exercises. Denies any stumbles/falls since last visit. Denies pain.  From Eval: Patient reports having 2 CVA last year (1 CVA affecting right and 1 affecting left side). Prior to CVA's he reports he was walking normally without an AD. States has been walking with walker ever since.    Pt accompanied by:  Paid caregiver  PERTINENT HISTORY: [facility] from [facility]2024- Patient is a 89 y/o veteran presenting with primary c/o difficulty in walking. PMH significant for CVA in Jan 2024 and in May 2024 with some residual R sided weakness.   PAIN:  Are you having pain? No  PRECAUTIONS: Fall  RED FLAGS: None   WEIGHT BEARING RESTRICTIONS: No  FALLS: Has patient fallen in last 6 months? Yes. Number of falls 1  LIVING ENVIRONMENT: Lives with: lives alone and has private  caregivers - everyday  and [facility] caregivers  -3x/week - approx  8 hours/day Lives in: House/apartment Stairs:  has ramp supplied by [place] Has following equipment at home: Single point cane, Walker - 2 wheeled, Wheelchair (manual), Shower bench, bed side commode, Grab bars, Ramped entry, and Transport w/c  PLOF: Independent  PATIENT GOALS: I want to walk without walker independently  OBJECTIVE:  Note: Objective measures were completed at Evaluation unless otherwise noted.  DIAGNOSTIC FINDINGS: CLINICAL DATA:  Stroke, follow up   EXAM: CT HEAD WITHOUT CONTRAST   TECHNIQUE: Contiguous axial images were obtained from the base of the skull through the vertex without intravenous contrast.   RADIATION DOSE REDUCTION: This exam was performed according to the departmental dose-optimization program which includes automated exposure control, adjustment of the mA and/or kV according to patient size and/or use of iterative reconstruction technique.   COMPARISON:  MR Brain 04/06/23, CT head 04/06/23   FINDINGS: Brain: No hemorrhage. No hydrocephalus. There are prominent subarachnoid spaces bilaterally, unchanged. There is sequela of moderate chronic microvascular ischemic change with a chronic infarct in the left corona radiata and an acute infarct in the right basal ganglia, better characterized on recent brain MRI.   Vascular: No hyperdense vessel or unexpected calcification.   Skull: Normal. Negative for fracture or focal lesion.   Sinuses/Orbits: No mastoid or middle ear effusion. Paranasal sinuses are clear. Bilateral lens replacement. Orbits are otherwise unremarkable.   Other: None.   IMPRESSION: 1. No acute intracranial hemorrhage. 2. Acute infarct in the right basal ganglia, better characterized on recent brain MRI.     Electronically Signed   By: Lorenza Cambridge M.D.   On: 04/07/2023 16:08  COGNITION: Overall cognitive status: Within functional limits for tasks  assessed   SENSATION: WFL  COORDINATION: WNL  EDEMA:  Some edema noted with lower left LE    POSTURE: rounded shoulders and forward head  LOWER EXTREMITY ROM:     Active  Right Eval Left Eval  Hip flexion    Hip extension    Hip abduction    Hip adduction    Hip internal rotation    Hip external rotation    Knee flexion    Knee extension    Ankle dorsiflexion    Ankle plantarflexion    Ankle inversion    Ankle eversion     (Blank rows = not tested)  LOWER EXTREMITY MMT:    MMT Right Eval Left Eval  Hip flexion 4 4  Hip extension    Hip abduction    Hip adduction    Hip internal rotation 4 4  Hip external rotation 4 4  Knee flexion 4 4  Knee extension 4 4  Ankle dorsiflexion 4 4  Ankle plantarflexion    Ankle inversion    Ankle eversion    (Blank rows = not tested)  BED MOBILITY:  NT but both patient and caregiver report no difficulty with bed mobility  TRANSFERS: Assistive device utilized: Environmental consultant - 2 wheeled  Sit to stand: CGA Stand to sit: CGA Chair to chair: CGA Floor:  not tested  GAIT: Gait pattern: decreased step length- Right and decreased step length- Left Distance walked: > 100 feet  Assistive device utilized: Walker - 2 wheeled Level of assistance: CGA Comments: anterior trunk lean  FUNCTIONAL TESTS:  5 times sit to stand: 22.32 sec without UE support Timed up and go (TUG): 27.94 sec  6 minute walk test: To be assessed visit # 2 10 meter walk test: 16.10 and 14.92 sec with RW=15.51 sec avg or 0.65  m/s using RW  Berg Balance Scale: To be assessed visit #2  PATIENT SURVEYS:  Stroke Impact Scale 52 ABC scale 37.5 %                                                                                                                              TREATMENT DATE: 01/29/24  Unless otherwise stated, CGA/min A was provided and gait belt donned in order to ensure pt safety throughout session.  Gait training ~161ft, no UE support, with min  A for balance - demos guarded UB posturing, slow gait speed, R>L lateral postural instability, wide BOS, and overall minor anterior lean bias with downward gaze.  Sit<>stands from mat, no UE support, x10reps with CGA - cuing for increased anterior trunk lean without pushing backs of legs against mat.   Standing more normal BOS (pt with wide BOS baseline) x49minute with addition of vertical head nods - CGA for safety with slight anterior/posterior sway.  Gait training ~144ft , no UE support, with continued light min A - slight improved balance and more relaxed UB posture - has 1x minor R LOB  Dynamic balance training including alternating foot taps to 4" green step, no UE support, 12reps + 6 reps each LE with heavy min A for balance and pt having greatest difficult tapping with L foot - cuing for increased glute activation and to maintain trunk/hip extension for upright posture to improve balance   Dynamic gait training of side stepping ~65ft each direction down/back x3 reps with B HHA and min A for balance due to instability in all directions.  Gait training 126ft again as described above with pt again having mild/moderate LOB, but towards L this time.  Dynamic balance task of reaching laterally and anteriorly to tap Blaze Pods on random setting to enhance cognitive processing and provide an unpredictable environment to foster quick reactions and adaptability. Pods were not placed far enough outside his BOS to have this be challenging.  -- advanced to having patient perform side stepping to get to targets on either side of him with min A for balance  Of note: side steping fatigues pt the quickest with labored breathing    PATIENT EDUCATION: Education details: Purpose of PT; purpose of functional outcome measures Person educated: Patient Education method: Explanation Education comprehension: verbalized understanding  HOME EXERCISE PROGRAM:  Access Code: [email or web address] URL:  [email or web address] Date: 01/25/2024 Prepared by: Casimiro Needle  Exercises - Sit to Stand with Counter Support  - 1 x daily - 7 x weekly - 2 sets - 10 reps - Narrow Stance with Counter Support  - 1 x daily - 7 x weekly - 2 sets - 30 seconds hold - Narrow Stance with Head Nods and Counter Support  - 1 x daily - 7 x weekly - 2 sets - 10 reps   GOALS: Goals reviewed with patient? Yes  SHORT TERM GOALS: Target date: 02/29/2024  Pt will be independent with HEP  in order to improve strength and balance in order to decrease fall risk and improve function at home and work.  Baseline: EVAL: no formal HEP in place Goal status: INITIAL   LONG TERM GOALS: Target date: 04/11/2024  1.  Patient (> 60 years old) will complete five times sit to stand test in < 15 seconds indicating an increased LE strength and improved balance. Baseline: EVAL: 22.32 sec without UE support Goal status: INITIAL  2.  Pt will improve ABC by at least 13% in order to demonstrate clinically significant improvement in balance confidence Baseline: EVAL: 37.5% Goal status: INITIAL   3.  Patient will increase Berg Balance score by > 6 points to demonstrate decreased fall risk during functional activities. Baseline: 01/25/2024: 25/56 Goal status: INITIAL   4.   Patient will reduce timed up and go to <11 seconds to reduce fall risk and demonstrate improved transfer/gait ability. Baseline: EVAL: 27.94 with RW Goal status: INITIAL  5.   Patient will increase 10 meter walk test to >1.58m/s as to improve gait speed for better community ambulation and to reduce fall risk. Baseline: EVAL: 0.65 m/s Goal status: INITIAL  6.   Patient will increase six minute walk test distance to >1000 for progression to community ambulator and improve gait ability Baseline: EVAL: To be assessed  visit #2 Goal status: INITIAL  7.  Patient will improve score on Stroke Impact Scale 16 by 8 points to signify a higher level of  functioning and less difficulty with daily activities.  Baseline: EVAL: 52 Goal status: INITIAL   ASSESSMENT:  CLINICAL IMPRESSION:  Patient is a 89 y.o. male who was seen today for physical therapy treatment for impaired balance, gait instability, and difficulty walking. Pt participated in 3x 153ft gait training without AD demoing wide BOS, increased postural instability in all directions, and anterior trunk lean bias requiring consistent min A for balance. Participated in dynamic balance activities with pt continuing to have lateral instability and significantly delayed and inadequate balance recovery strategies. Pt tolerated progression to more challenging dynamic balance interventions today with seated rest breaks as needed. Mr. Moltz will benefit from skilled PT services to improve his overall functional strength and walking to enhance his independence and decrease his risk of falling.   OBJECTIVE IMPAIRMENTS: Abnormal gait, decreased activity tolerance, decreased balance, decreased endurance, decreased mobility, and decreased strength.   ACTIVITY LIMITATIONS: carrying, lifting, bending, standing, squatting, and stairs  PARTICIPATION LIMITATIONS: cleaning, laundry, driving, shopping, community activity, and yard work  PERSONAL FACTORS: Age and 3+ comorbidities: HTN, CHF, A-FIB, DM  are also affecting patient's functional outcome.   REHAB POTENTIAL: Good  CLINICAL DECISION MAKING: Evolving/moderate complexity  EVALUATION COMPLEXITY: Moderate  PLAN:  PT FREQUENCY: 1-2x/week  PT DURATION: 12 weeks  PLANNED INTERVENTIONS: [id number]- PT Re-evaluation, 97110-Therapeutic exercises, 97530- Therapeutic activity, [id number]- Neuromuscular re-education, 97535- Self Care, [id number]- Manual therapy, [id number]- Gait training, [phone]- Canalith repositioning, [phone]- Electrical stimulation (manual), Patient/Family education, Balance training, Stair training, Dry Needling, Joint mobilization, Vestibular training,  DME instructions, Cryotherapy, and Moist heat  PLAN FOR NEXT SESSION:  - 6 min walk test  - review new HEP - balance interventions including: narrow BOS, reaching/turning/rotating, eyes closed, and alternating foot taps - gait training without AD - implement therex/theract/gait training  Casimiro Needle, PT, DPT, NCS, CSRS Physical Therapist - [place]  [facility]  1:10 PM 01/29/24

## 2024-01-30 ENCOUNTER — Ambulatory Visit: Payer: PPO

## 2024-01-31 ENCOUNTER — Ambulatory Visit: Payer: PPO

## 2024-02-01 ENCOUNTER — Ambulatory Visit

## 2024-02-01 DIAGNOSIS — R262 Difficulty in walking, not elsewhere classified: Secondary | ICD-10-CM

## 2024-02-01 DIAGNOSIS — M6281 Muscle weakness (generalized): Secondary | ICD-10-CM

## 2024-02-01 DIAGNOSIS — I69398 Other sequelae of cerebral infarction: Secondary | ICD-10-CM

## 2024-02-01 DIAGNOSIS — R269 Unspecified abnormalities of gait and mobility: Secondary | ICD-10-CM | POA: Diagnosis not present

## 2024-02-01 NOTE — Therapy (Signed)
 OUTPATIENT PHYSICAL THERAPY NEURO TREATMENT   Patient Name: Joseph [name] MRN: [id number] DOB:03-Oct-1925, 89 y.o., male Today's Date: 02/02/2024   PCP: Dr. Julieanne Manson REFERRING PROVIDER: Toya Smothers, PA  END OF SESSION:    PT End of Session - 02/01/24 1358     Visit Number 4    Number of Visits 24    Date for PT Re-Evaluation 04/11/24    Progress Note Due on Visit 10    PT Start Time 1400    PT Stop Time 1443    PT Time Calculation (min) 43 min    Equipment Utilized During Treatment Gait belt    Activity Tolerance Patient tolerated treatment well    Behavior During Therapy WFL for tasks assessed/performed                Past Medical History:  Diagnosis Date   Acute respiratory failure with hypoxia (HCC) 03/21/2016   Atrial fibrillation (HCC) 03/22/2023   CAD (coronary artery disease)    a. s/p CABG;  b. 07/2012 Cath: 3VD including 80 dLCX (med Rx), 4/4 patent grafts.   Chronic diastolic CHF (congestive heart failure) (HCC)    a. 07/2012 Echo: EF 55-60%, no rwma, Gr 1 DD, mild MR;  04/2014 Echo: EF 55-60%, no rwma, mild MR, mildly dil LA.   Diabetes mellitus, type 2 (HCC)    HTN (hypertension)    Hyperlipidemia    Hypothyroidism    Lower extremity edema    a. 03/2014 amlodipine d/c'd.   Spinal stenosis    Stroke (HCC)    1/24 and 5/24   Past Surgical History:  Procedure Laterality Date   APPENDECTOMY     CARDIAC CATHETERIZATION  07/2012   [facility]/no stents   CHOLECYSTECTOMY     CORNEAL TRANSPLANT     CORONARY ARTERY BYPASS GRAFT  1994   HEMORRHOID SURGERY     LEFT HEART CATH AND CORS/GRAFTS ANGIOGRAPHY N/A 12/17/2019   Procedure: LEFT HEART CATH AND CORS/GRAFTS ANGIOGRAPHY;  Surgeon: Yvonne Kendall, MD;  Location: [facility] INVASIVE CV [facility];  Service: Cardiovascular;  Laterality: N/A;   PROSTATE BIOPSY     TOE SURGERY     ingrown toe nail   Patient Active Problem List   Diagnosis Date Noted   Chronic anticoagulation 04/23/2023   Stroke (HCC)  04/06/2023   Type II diabetes mellitus with renal manifestations (HCC) 04/06/2023   Chronic kidney disease, stage 3a (HCC) 04/06/2023   COPD (chronic obstructive pulmonary disease) (HCC) 04/06/2023   Chronic diastolic CHF (congestive heart failure) (HCC) 04/06/2023   Permanent atrial fibrillation (HCC) 04/06/2023   Obesity (BMI 30-39.9) 04/06/2023   Acute cerebrovascular accident (CVA) (HCC) 12/09/2022   Chronic combined systolic and diastolic CHF (congestive heart failure) (HCC) 12/09/2022   Fall at home, initial encounter 12/09/2022   Hypokalemia 12/09/2022   Hemiparesis affecting right side as late effect of cerebrovascular accident (CVA) (HCC) 12/09/2022   Skin tear of left hand without complication 11/16/2022   Pain of left hip 11/16/2022   Left hip pain 11/10/2022   Intertrigo 11/10/2022   COPD with acute exacerbation (HCC) 10/06/2022   Diabetes mellitus (HCC) 06/10/2022   Enlarged prostate 03/02/2022   Other specified counseling 03/02/2022   Renal calculi 03/02/2022   Gastroesophageal reflux disease 03/02/2022   Hypertensive disorder 03/02/2022   Mixed hyperlipidemia 03/02/2022   Hypothyroidism 03/02/2022   Acute non-recurrent frontal sinusitis 01/05/2022   Glands swollen 01/05/2022   Hypertension associated with diabetes (HCC) 01/05/2022   Acute rhinitis 01/05/2022  AV block, Mobitz 1    NSTEMI (non-ST elevated myocardial infarction) (HCC)    ACS (acute coronary syndrome) (HCC) 12/16/2019   Diverticulitis of large intestine without perforation or abscess without bleeding    Pain of upper abdomen    SOB (shortness of breath)    S/P CABG (coronary artery bypass graft)    Back pain, chronic 07/28/2015   Benign fibroma of prostate 07/28/2015   Acid reflux 07/28/2015   Adult hypothyroidism 07/28/2015   Neuropathy 07/28/2015   Arthritis, degenerative 07/28/2015   Psoriasis 07/28/2015   Lumbar spondylosis 05/08/2015   Obesity 09/09/2014   Bilateral leg weakness  09/09/2014   Acute combined systolic and diastolic heart failure (HCC) 05/13/2014   Bilateral leg edema 04/15/2014   Bradycardia 08/13/2013   Edema 06/21/2012   Weakness 09/15/2011   Type 2 diabetes mellitus with diabetic peripheral angiopathy without gangrene, with long-term current use of insulin (HCC) 09/15/2011   DYSPNEA 06/09/2009   Hyperlipidemia 06/07/2009   Essential hypertension 06/07/2009   Coronary artery disease 06/07/2009    ONSET DATE: Jan 2024  REFERRING DIAG:  R26.81 (ICD-10-CM) - Gait instability  R26.89 (ICD-10-CM) - Decreased mobility    THERAPY DIAG:  Abnormality of gait  Muscle weakness (generalized)  Difficulty walking  Imbalance due to old stroke  Rationale for Evaluation and Treatment: Rehabilitation  SUBJECTIVE:                                                                                                                                                                                             SUBJECTIVE STATEMENT:  Pt reports feeling better today as he was sick yesterday.   From Eval: Patient reports having 2 CVA last year (1 CVA affecting right and 1 affecting left side). Prior to CVA's he reports he was walking normally without an AD. States has been walking with walker ever since.    Pt accompanied by:  Paid caregiver  PERTINENT HISTORY: [facility]24- Patient is a 89 y/o veteran presenting with primary c/o difficulty in walking. PMH significant for CVA in Jan 2024 and in May 2024 with some residual R sided weakness.   PAIN:  Are you having pain? No  PRECAUTIONS: Fall  RED FLAGS: None   WEIGHT BEARING RESTRICTIONS: No  FALLS: Has patient fallen in last 6 months? Yes. Number of falls 1  LIVING ENVIRONMENT: Lives with: lives alone and has private  caregivers - everyday  and [facility] caregivers -3x/week - approx 8 hours/day Lives in: House/apartment Stairs:  has ramp supplied by [place] Has following equipment at home:  Single point cane, Walker - 2  wheeled, Wheelchair (manual), Shower bench, bed side commode, Grab bars, Ramped entry, and Transport w/c  PLOF: Independent  PATIENT GOALS: I want to walk without walker independently  OBJECTIVE:  Note: Objective measures were completed at Evaluation unless otherwise noted.  DIAGNOSTIC FINDINGS: CLINICAL DATA:  Stroke, follow up   EXAM: CT HEAD WITHOUT CONTRAST   TECHNIQUE: Contiguous axial images were obtained from the base of the skull through the vertex without intravenous contrast.   RADIATION DOSE REDUCTION: This exam was performed according to the departmental dose-optimization program which includes automated exposure control, adjustment of the mA and/or kV according to patient size and/or use of iterative reconstruction technique.   COMPARISON:  MR Brain 04/06/23, CT head 04/06/23   FINDINGS: Brain: No hemorrhage. No hydrocephalus. There are prominent subarachnoid spaces bilaterally, unchanged. There is sequela of moderate chronic microvascular ischemic change with a chronic infarct in the left corona radiata and an acute infarct in the right basal ganglia, better characterized on recent brain MRI.   Vascular: No hyperdense vessel or unexpected calcification.   Skull: Normal. Negative for fracture or focal lesion.   Sinuses/Orbits: No mastoid or middle ear effusion. Paranasal sinuses are clear. Bilateral lens replacement. Orbits are otherwise unremarkable.   Other: None.   IMPRESSION: 1. No acute intracranial hemorrhage. 2. Acute infarct in the right basal ganglia, better characterized on recent brain MRI.     Electronically Signed   By: Lorenza Cambridge M.D.   On: 04/07/2023 16:08  COGNITION: Overall cognitive status: Within functional limits for tasks assessed   SENSATION: WFL  COORDINATION: WNL  EDEMA:  Some edema noted with lower left LE    POSTURE: rounded shoulders and forward head  LOWER EXTREMITY ROM:      Active  Right Eval Left Eval  Hip flexion    Hip extension    Hip abduction    Hip adduction    Hip internal rotation    Hip external rotation    Knee flexion    Knee extension    Ankle dorsiflexion    Ankle plantarflexion    Ankle inversion    Ankle eversion     (Blank rows = not tested)  LOWER EXTREMITY MMT:    MMT Right Eval Left Eval  Hip flexion 4 4  Hip extension    Hip abduction    Hip adduction    Hip internal rotation 4 4  Hip external rotation 4 4  Knee flexion 4 4  Knee extension 4 4  Ankle dorsiflexion 4 4  Ankle plantarflexion    Ankle inversion    Ankle eversion    (Blank rows = not tested)  BED MOBILITY:  NT but both patient and caregiver report no difficulty with bed mobility  TRANSFERS: Assistive device utilized: Environmental consultant - 2 wheeled  Sit to stand: CGA Stand to sit: CGA Chair to chair: CGA Floor:  not tested  GAIT: Gait pattern: decreased step length- Right and decreased step length- Left Distance walked: > 100 feet  Assistive device utilized: Walker - 2 wheeled Level of assistance: CGA Comments: anterior trunk lean  FUNCTIONAL TESTS:  5 times sit to stand: 22.32 sec without UE support Timed up and go (TUG): 27.94 sec  6 minute walk test: To be assessed visit # 2 10 meter walk test: 16.10 and 14.92 sec with RW=15.51 sec avg or 0.65 m/s using RW  Berg Balance Scale: To be assessed visit #2  PATIENT SURVEYS:  Stroke Impact Scale 52 ABC scale 37.5 %  TREATMENT DATE: 02/02/24  Unless otherwise stated, CGA/min A was provided and gait belt donned in order to ensure pt safety throughout session.  Therapeutic activities:   Seated knee ext (for full ROM +Strength) 3# AW 3 sec hold x 12 reps   Sit<>stands from mat, no UE support, x10reps with CGA - cuing for increased anterior trunk lean without pushing backs of  legs against mat as well as VC- Focus on eccentric control  Step tap onto 6" block without UE support x 15 reps (with walker in front yet did not need UE support) - Lacking confidence initially specifically standing on right LE today but improved with practice.       Gait training  - RW - 170 feet overall minor anterior lean bias with downward gaze.       Gait training ~31ft forward and then 10 feet backward , no UE support, with continued light min A - slight improved balance and more relaxed UB posture - has 1x minor R LOB   Gait training: Instruction in use of SPC- approx 50 feet including 180 deg turning. Patient with difficulty initially with gait sequencing -Step through gait - did improve with practice      PATIENT EDUCATION: Education details: Purpose of PT; purpose of functional outcome measures Person educated: Patient Education method: Explanation Education comprehension: verbalized understanding  HOME EXERCISE PROGRAM:  Access Code: [email or web address] URL: [email or web address] Date: 01/25/2024 Prepared by: Casimiro Needle  Exercises - Sit to Stand with Counter Support  - 1 x daily - 7 x weekly - 2 sets - 10 reps - Narrow Stance with Counter Support  - 1 x daily - 7 x weekly - 2 sets - 30 seconds hold - Narrow Stance with Head Nods and Counter Support  - 1 x daily - 7 x weekly - 2 sets - 10 reps   GOALS: Goals reviewed with patient? Yes  SHORT TERM GOALS: Target date: 02/29/2024  Pt will be independent with HEP in order to improve strength and balance in order to decrease fall risk and improve function at home and work.  Baseline: EVAL: no formal HEP in place Goal status: INITIAL   LONG TERM GOALS: Target date: 04/11/2024  1.  Patient (> 61 years old) will complete five times sit to stand test in < 15 seconds indicating an increased LE strength and improved balance. Baseline: EVAL: 22.32 sec without UE support Goal status: INITIAL  2.  Pt will  improve ABC by at least 13% in order to demonstrate clinically significant improvement in balance confidence Baseline: EVAL: 37.5% Goal status: INITIAL   3.  Patient will increase Berg Balance score by > 6 points to demonstrate decreased fall risk during functional activities. Baseline: 01/25/2024: 25/56 Goal status: INITIAL   4.   Patient will reduce timed up and go to <11 seconds to reduce fall risk and demonstrate improved transfer/gait ability. Baseline: EVAL: 27.94 with RW Goal status: INITIAL  5.   Patient will increase 10 meter walk test to >1.82m/s as to improve gait speed for better community ambulation and to reduce fall risk. Baseline: EVAL: 0.65 m/s Goal status: INITIAL  6.   Patient will increase six minute walk test distance to >1000 for progression to community ambulator and improve gait ability Baseline: EVAL: To be assessed  visit #2 Goal status: INITIAL  7.  Patient will improve score on Stroke Impact Scale 16 by 8 points to signify a higher level of functioning and less difficulty with daily  activities.  Baseline: EVAL: 52 Goal status: INITIAL   ASSESSMENT:  CLINICAL IMPRESSION:  Patient remains focused on returning to walk without a walker. Did express okay to practice with cane as a progress from walker. He was instructed in use of cane today with fair results as he did improve with overall cadence and sequencing but will benefit from further practice. Otherwise he performed well - continues to present with wide BOS with standing and walking with anterior trunk posture- cues to stand erect and look ahead. Joseph [name] will benefit from skilled PT services to improve his overall functional strength and walking to enhance his independence and decrease his risk of falling.   OBJECTIVE IMPAIRMENTS: Abnormal gait, decreased activity tolerance, decreased balance, decreased endurance, decreased mobility, and decreased strength.   ACTIVITY LIMITATIONS: carrying, lifting,  bending, standing, squatting, and stairs  PARTICIPATION LIMITATIONS: cleaning, laundry, driving, shopping, community activity, and yard work  PERSONAL FACTORS: Age and 3+ comorbidities: HTN, CHF, A-FIB, DM  are also affecting patient's functional outcome.   REHAB POTENTIAL: Good  CLINICAL DECISION MAKING: Evolving/moderate complexity  EVALUATION COMPLEXITY: Moderate  PLAN:  PT FREQUENCY: 1-2x/week  PT DURATION: 12 weeks  PLANNED INTERVENTIONS: [id number]- PT Re-evaluation, 97110-Therapeutic exercises, 97530- Therapeutic activity, 97112- Neuromuscular re-education, 97535- Self Care, [id number]- Manual therapy, [phone]- Gait training, [phone]- Canalith repositioning, [phone]- Electrical stimulation (manual), Patient/Family education, Balance training, Stair training, Dry Needling, Joint mobilization, Vestibular training, DME instructions, Cryotherapy, and Moist heat  PLAN FOR NEXT SESSION:  - 6 min walk test  - review new HEP - balance interventions including: narrow BOS, reaching/turning/rotating, eyes closed, and alternating foot taps - gait training without AD - implement therex/theract/gait training  Louis Meckel, PT Physical Therapist - [facility]  7:09 AM 02/02/24

## 2024-02-05 ENCOUNTER — Ambulatory Visit: Payer: PPO

## 2024-02-06 ENCOUNTER — Ambulatory Visit: Payer: PPO

## 2024-02-06 DIAGNOSIS — M6281 Muscle weakness (generalized): Secondary | ICD-10-CM

## 2024-02-06 DIAGNOSIS — R269 Unspecified abnormalities of gait and mobility: Secondary | ICD-10-CM | POA: Diagnosis not present

## 2024-02-06 DIAGNOSIS — R2681 Unsteadiness on feet: Secondary | ICD-10-CM

## 2024-02-06 DIAGNOSIS — R278 Other lack of coordination: Secondary | ICD-10-CM

## 2024-02-06 DIAGNOSIS — R262 Difficulty in walking, not elsewhere classified: Secondary | ICD-10-CM

## 2024-02-06 NOTE — Therapy (Signed)
 OUTPATIENT PHYSICAL THERAPY NEURO TREATMENT   Patient Name: Joseph [name] MRN: [id number] DOB:1925/04/15, 89 y.o., male Today's Date: 02/06/2024   PCP: Dr. Julieanne Manson REFERRING PROVIDER: Toya Smothers, PA  END OF SESSION:    PT End of Session - 02/06/24 1508     Visit Number 5    Number of Visits 24    Date for PT Re-Evaluation 04/11/24    Progress Note Due on Visit 10    PT Start Time 1021    PT Stop Time 1103    PT Time Calculation (min) 42 min    Equipment Utilized During Treatment Gait belt    Activity Tolerance Patient tolerated treatment well    Behavior During Therapy WFL for tasks assessed/performed                 Past Medical History:  Diagnosis Date   Acute respiratory failure with hypoxia (HCC) 03/21/2016   Atrial fibrillation (HCC) 03/22/2023   CAD (coronary artery disease)    a. s/p CABG;  b. 07/2012 Cath: 3VD including 80 dLCX (med Rx), 4/4 patent grafts.   Chronic diastolic CHF (congestive heart failure) (HCC)    a. 07/2012 Echo: EF 55-60%, no rwma, Gr 1 DD, mild MR;  04/2014 Echo: EF 55-60%, no rwma, mild MR, mildly dil LA.   Diabetes mellitus, type 2 (HCC)    HTN (hypertension)    Hyperlipidemia    Hypothyroidism    Lower extremity edema    a. 03/2014 amlodipine d/c'd.   Spinal stenosis    Stroke (HCC)    1/24 and 5/24   Past Surgical History:  Procedure Laterality Date   APPENDECTOMY     CARDIAC CATHETERIZATION  07/2012   [facility]/no stents   CHOLECYSTECTOMY     CORNEAL TRANSPLANT     CORONARY ARTERY BYPASS GRAFT  1994   HEMORRHOID SURGERY     LEFT HEART CATH AND CORS/GRAFTS ANGIOGRAPHY N/A 12/17/2019   Procedure: LEFT HEART CATH AND CORS/GRAFTS ANGIOGRAPHY;  Surgeon: Yvonne Kendall, MD;  Location: [facility] INVASIVE CV [facility];  Service: Cardiovascular;  Laterality: N/A;   PROSTATE BIOPSY     TOE SURGERY     ingrown toe nail   Patient Active Problem List   Diagnosis Date Noted   Chronic anticoagulation 04/23/2023   Stroke (HCC)  04/06/2023   Type II diabetes mellitus with renal manifestations (HCC) 04/06/2023   Chronic kidney disease, stage 3a (HCC) 04/06/2023   COPD (chronic obstructive pulmonary disease) (HCC) 04/06/2023   Chronic diastolic CHF (congestive heart failure) (HCC) 04/06/2023   Permanent atrial fibrillation (HCC) 04/06/2023   Obesity (BMI 30-39.9) 04/06/2023   Acute cerebrovascular accident (CVA) (HCC) 12/09/2022   Chronic combined systolic and diastolic CHF (congestive heart failure) (HCC) 12/09/2022   Fall at home, initial encounter 12/09/2022   Hypokalemia 12/09/2022   Hemiparesis affecting right side as late effect of cerebrovascular accident (CVA) (HCC) 12/09/2022   Skin tear of left hand without complication 11/16/2022   Pain of left hip 11/16/2022   Left hip pain 11/10/2022   Intertrigo 11/10/2022   COPD with acute exacerbation (HCC) 10/06/2022   Diabetes mellitus (HCC) 06/10/2022   Enlarged prostate 03/02/2022   Other specified counseling 03/02/2022   Renal calculi 03/02/2022   Gastroesophageal reflux disease 03/02/2022   Hypertensive disorder 03/02/2022   Mixed hyperlipidemia 03/02/2022   Hypothyroidism 03/02/2022   Acute non-recurrent frontal sinusitis 01/05/2022   Glands swollen 01/05/2022   Hypertension associated with diabetes (HCC) 01/05/2022   Acute rhinitis 01/05/2022  AV block, Mobitz 1    NSTEMI (non-ST elevated myocardial infarction) (HCC)    ACS (acute coronary syndrome) (HCC) 12/16/2019   Diverticulitis of large intestine without perforation or abscess without bleeding    Pain of upper abdomen    SOB (shortness of breath)    S/P CABG (coronary artery bypass graft)    Back pain, chronic 07/28/2015   Benign fibroma of prostate 07/28/2015   Acid reflux 07/28/2015   Adult hypothyroidism 07/28/2015   Neuropathy 07/28/2015   Arthritis, degenerative 07/28/2015   Psoriasis 07/28/2015   Lumbar spondylosis 05/08/2015   Obesity 09/09/2014   Bilateral leg weakness  09/09/2014   Acute combined systolic and diastolic heart failure (HCC) 05/13/2014   Bilateral leg edema 04/15/2014   Bradycardia 08/13/2013   Edema 06/21/2012   Weakness 09/15/2011   Type 2 diabetes mellitus with diabetic peripheral angiopathy without gangrene, with long-term current use of insulin (HCC) 09/15/2011   DYSPNEA 06/09/2009   Hyperlipidemia 06/07/2009   Essential hypertension 06/07/2009   Coronary artery disease 06/07/2009    ONSET DATE: Jan 2024  REFERRING DIAG:  R26.81 (ICD-10-CM) - Gait instability  R26.89 (ICD-10-CM) - Decreased mobility    THERAPY DIAG:  Muscle weakness (generalized)  Difficulty in walking, not elsewhere classified  Other lack of coordination  Unsteadiness on feet  Rationale for Evaluation and Treatment: Rehabilitation  SUBJECTIVE:                                                                                                                                                                                             SUBJECTIVE STATEMENT: Pt reports no aches/pains currently.Marland Kitchen He reports his stomach is better. He reports no stumbles/falls, and his HEP is going well. Pt accompanied by:  daughters  PERTINENT HISTORY: From Eval: Patient reports having 2 CVA last year (1 CVA affecting right and 1 affecting left side). Prior to CVA's he reports he was walking normally without an AD. States has been walking with walker ever since.   [facility]- Patient is a 89 y/o veteran presenting with primary c/o difficulty in walking. PMH significant for CVA in Jan 2024 and in May 2024 with some residual R sided weakness.   PAIN:  Are you having pain? No  PRECAUTIONS: Fall  RED FLAGS: None   WEIGHT BEARING RESTRICTIONS: No  FALLS: Has patient fallen in last 6 months? Yes. Number of falls 1  LIVING ENVIRONMENT: Lives with: lives alone and has private  caregivers - everyday  and [facility] caregivers -3x/week - approx 8 hours/day Lives in:  House/apartment Stairs:  has ramp supplied by [place] Has following  equipment at home: Single point cane, Walker - 2 wheeled, Wheelchair (manual), Shower bench, bed side commode, Grab bars, Ramped entry, and Transport w/c  PLOF: Independent  PATIENT GOALS: I want to walk without walker independently  OBJECTIVE:  Note: Objective measures were completed at Evaluation unless otherwise noted.  DIAGNOSTIC FINDINGS: CLINICAL DATA:  Stroke, follow up   EXAM: CT HEAD WITHOUT CONTRAST   TECHNIQUE: Contiguous axial images were obtained from the base of the skull through the vertex without intravenous contrast.   RADIATION DOSE REDUCTION: This exam was performed according to the departmental dose-optimization program which includes automated exposure control, adjustment of the mA and/or kV according to patient size and/or use of iterative reconstruction technique.   COMPARISON:  MR Brain 04/06/23, CT head 04/06/23   FINDINGS: Brain: No hemorrhage. No hydrocephalus. There are prominent subarachnoid spaces bilaterally, unchanged. There is sequela of moderate chronic microvascular ischemic change with a chronic infarct in the left corona radiata and an acute infarct in the right basal ganglia, better characterized on recent brain MRI.   Vascular: No hyperdense vessel or unexpected calcification.   Skull: Normal. Negative for fracture or focal lesion.   Sinuses/Orbits: No mastoid or middle ear effusion. Paranasal sinuses are clear. Bilateral lens replacement. Orbits are otherwise unremarkable.   Other: None.   IMPRESSION: 1. No acute intracranial hemorrhage. 2. Acute infarct in the right basal ganglia, better characterized on recent brain MRI.     Electronically Signed   By: Lorenza Cambridge M.D.   On: 04/07/2023 16:08  COGNITION: Overall cognitive status: Within functional limits for tasks assessed   SENSATION: WFL  COORDINATION: WNL  EDEMA:  Some edema noted with lower left  LE    POSTURE: rounded shoulders and forward head  LOWER EXTREMITY ROM:     Active  Right Eval Left Eval  Hip flexion    Hip extension    Hip abduction    Hip adduction    Hip internal rotation    Hip external rotation    Knee flexion    Knee extension    Ankle dorsiflexion    Ankle plantarflexion    Ankle inversion    Ankle eversion     (Blank rows = not tested)  LOWER EXTREMITY MMT:    MMT Right Eval Left Eval  Hip flexion 4 4  Hip extension    Hip abduction    Hip adduction    Hip internal rotation 4 4  Hip external rotation 4 4  Knee flexion 4 4  Knee extension 4 4  Ankle dorsiflexion 4 4  Ankle plantarflexion    Ankle inversion    Ankle eversion    (Blank rows = not tested)  BED MOBILITY:  NT but both patient and caregiver report no difficulty with bed mobility  TRANSFERS: Assistive device utilized: Environmental consultant - 2 wheeled  Sit to stand: CGA Stand to sit: CGA Chair to chair: CGA Floor:  not tested  GAIT: Gait pattern: decreased step length- Right and decreased step length- Left Distance walked: > 100 feet  Assistive device utilized: Walker - 2 wheeled Level of assistance: CGA Comments: anterior trunk lean  FUNCTIONAL TESTS:  5 times sit to stand: 22.32 sec without UE support Timed up and go (TUG): 27.94 sec  6 minute walk test: To be assessed visit # 2 10 meter walk test: 16.10 and 14.92 sec with RW=15.51 sec avg or 0.65 m/s using RW  Berg Balance Scale: To be assessed visit #2  PATIENT  SURVEYS:  Stroke Impact Scale 52 ABC scale 37.5 %                                                                                                                              TREATMENT DATE: 02/06/24  Unless otherwise stated, CGA/min A was provided and gait belt donned in order to ensure pt safety throughout session.  Therapeutic activities:   Sit<>stands from ma  table with decreasing height of seat, no UE support, x6,  x10, x8 reps with CGA - continued  cuing for increased anterior trunk lean without pushing backs of legs against mat as well as VC- Focus on eccentric control  Seated knee ext (for full ROM +Strength) 3# AW 3 sec hold x 12 reps each - cuing to prevent compensatory rocking backwards/use of momentum.  Seated DF 15x bilat LE - exhibits WFL AROM  Seated march 3x10 each LE -dfficulty maintaining good technique, cuing for upright posture in chair   Gait training  - RW - 2x170 continued minor anterior lean bias with downward gaze. - cuing provided for upright postuire and toe clearance R/heel strike as pt with occ toe drag . Reviewed possibility of use of brace in future if pt has difficulty improving toe clearnce, howeve, pt did ok with this in session and exhibited ability to correct majority of steps with cuing   Self Care/Home Management: Reviewed with pt and DTRs importance of progressive walking program with his walker. PT encourages that pt ambulate 5 min/day with walker, try to add 30 sec to 1 min over time as this gets easier. Encouraged as pt DTR reports pt not ambulating much outside of PT.      PATIENT EDUCATION: Education details: exercise technique, activity recommendations  Person educated: Patient, DTR Education method: Explanation Education comprehension: verbalized understanding  HOME EXERCISE PROGRAM:  Access Code: [email or web address] URL: [email or web address] Date: 01/25/2024 Prepared by: Casimiro Needle  Exercises - Sit to Stand with Counter Support  - 1 x daily - 7 x weekly - 2 sets - 10 reps - Narrow Stance with Counter Support  - 1 x daily - 7 x weekly - 2 sets - 30 seconds hold - Narrow Stance with Head Nods and Counter Support  - 1 x daily - 7 x weekly - 2 sets - 10 reps   GOALS: Goals reviewed with patient? Yes  SHORT TERM GOALS: Target date: 02/29/2024  Pt will be independent with HEP in order to improve strength and balance in order to decrease fall risk and improve function at home and  work.  Baseline: EVAL: no formal HEP in place Goal status: INITIAL   LONG TERM GOALS: Target date: 04/11/2024  1.  Patient (> 60 years old) will complete five times sit to stand test in < 15 seconds indicating an increased LE strength and improved balance. Baseline: EVAL: 22.32 sec without UE support Goal status: INITIAL  2.  Pt will improve ABC by at  least 13% in order to demonstrate clinically significant improvement in balance confidence Baseline: EVAL: 37.5% Goal status: INITIAL   3.  Patient will increase Berg Balance score by > 6 points to demonstrate decreased fall risk during functional activities. Baseline: 01/25/2024: 25/56 Goal status: INITIAL   4.   Patient will reduce timed up and go to <11 seconds to reduce fall risk and demonstrate improved transfer/gait ability. Baseline: EVAL: 27.94 with RW Goal status: INITIAL  5.   Patient will increase 10 meter walk test to >1.86m/s as to improve gait speed for better community ambulation and to reduce fall risk. Baseline: EVAL: 0.65 m/s Goal status: INITIAL  6.   Patient will increase six minute walk test distance to >1000 for progression to community ambulator and improve gait ability Baseline: EVAL: To be assessed  visit #2 Goal status: INITIAL  7.  Patient will improve score on Stroke Impact Scale 16 by 8 points to signify a higher level of functioning and less difficulty with daily activities.  Baseline: EVAL: 52 Goal status: INITIAL   ASSESSMENT:  CLINICAL IMPRESSION:  Author continued plan as laid out in recent sessions. Pt with excellent motivation to participate with improved gait mechanics during visit following cuing. PT did instruct/encourage progressive walking program with RW at this time as pt DTR reported pt does not ambulate much at home, and pt noted to fatigue quickly with gait activities today. Joseph [name] will benefit from skilled PT services to improve his overall functional strength and walking to  enhance his independence and decrease his risk of falling.   OBJECTIVE IMPAIRMENTS: Abnormal gait, decreased activity tolerance, decreased balance, decreased endurance, decreased mobility, and decreased strength.   ACTIVITY LIMITATIONS: carrying, lifting, bending, standing, squatting, and stairs  PARTICIPATION LIMITATIONS: cleaning, laundry, driving, shopping, community activity, and yard work  PERSONAL FACTORS: Age and 3+ comorbidities: HTN, CHF, A-FIB, DM  are also affecting patient's functional outcome.   REHAB POTENTIAL: Good  CLINICAL DECISION MAKING: Evolving/moderate complexity  EVALUATION COMPLEXITY: Moderate  PLAN:  PT FREQUENCY: 1-2x/week  PT DURATION: 12 weeks  PLANNED INTERVENTIONS: [id number]- PT Re-evaluation, 97110-Therapeutic exercises, 97530- Therapeutic activity, 97112- Neuromuscular re-education, 97535- Self Care, [id number]- Manual therapy, [phone]- Gait training, [id number]- Canalith repositioning, [phone]- Electrical stimulation (manual), Patient/Family education, Balance training, Stair training, Dry Needling, Joint mobilization, Vestibular training, DME instructions, Cryotherapy, and Moist heat  PLAN FOR NEXT SESSION:  - 6 min walk test  - review new HEP - balance interventions including: narrow BOS, reaching/turning/rotating, eyes closed, and alternating foot taps - gait training without AD - implement therex/theract/gait training Continue plan  Temple Pacini PT, DPT  Physical Therapist - [facility]  [facility]  3:15 PM 02/06/24

## 2024-02-07 ENCOUNTER — Ambulatory Visit: Payer: PPO

## 2024-02-08 ENCOUNTER — Ambulatory Visit: Payer: PPO | Admitting: Physical Therapy

## 2024-02-12 ENCOUNTER — Ambulatory Visit: Payer: PPO | Admitting: Physical Therapy

## 2024-02-13 ENCOUNTER — Ambulatory Visit: Payer: PPO

## 2024-02-13 DIAGNOSIS — R269 Unspecified abnormalities of gait and mobility: Secondary | ICD-10-CM | POA: Diagnosis not present

## 2024-02-13 DIAGNOSIS — M6281 Muscle weakness (generalized): Secondary | ICD-10-CM

## 2024-02-13 DIAGNOSIS — R262 Difficulty in walking, not elsewhere classified: Secondary | ICD-10-CM

## 2024-02-13 DIAGNOSIS — R2681 Unsteadiness on feet: Secondary | ICD-10-CM

## 2024-02-13 NOTE — Therapy (Signed)
 OUTPATIENT PHYSICAL THERAPY NEURO TREATMENT   Patient Name: Joseph [name] MRN: [id number] DOB:04-25-1925, 89 y.o., male Today's Date: 02/13/2024   PCP: Dr. Julieanne Manson REFERRING PROVIDER: Toya Smothers, PA  END OF SESSION:    PT End of Session - 02/13/24 1348     Visit Number 6    Number of Visits 24    Date for PT Re-Evaluation 04/11/24    Progress Note Due on Visit 10    PT Start Time 1103    PT Stop Time 1145    PT Time Calculation (min) 42 min    Equipment Utilized During Treatment Gait belt    Activity Tolerance Patient tolerated treatment well    Behavior During Therapy WFL for tasks assessed/performed                  Past Medical History:  Diagnosis Date   Acute respiratory failure with hypoxia (HCC) 03/21/2016   Atrial fibrillation (HCC) 03/22/2023   CAD (coronary artery disease)    a. s/p CABG;  b. 07/2012 Cath: 3VD including 80 dLCX (med Rx), 4/4 patent grafts.   Chronic diastolic CHF (congestive heart failure) (HCC)    a. 07/2012 Echo: EF 55-60%, no rwma, Gr 1 DD, mild MR;  04/2014 Echo: EF 55-60%, no rwma, mild MR, mildly dil LA.   Diabetes mellitus, type 2 (HCC)    HTN (hypertension)    Hyperlipidemia    Hypothyroidism    Lower extremity edema    a. 03/2014 amlodipine d/c'd.   Spinal stenosis    Stroke (HCC)    1/24 and 5/24   Past Surgical History:  Procedure Laterality Date   APPENDECTOMY     CARDIAC CATHETERIZATION  07/2012   [facility]/no stents   CHOLECYSTECTOMY     CORNEAL TRANSPLANT     CORONARY ARTERY BYPASS GRAFT  1994   HEMORRHOID SURGERY     LEFT HEART CATH AND CORS/GRAFTS ANGIOGRAPHY N/A 12/17/2019   Procedure: LEFT HEART CATH AND CORS/GRAFTS ANGIOGRAPHY;  Surgeon: Yvonne Kendall, MD;  Location: [facility] INVASIVE CV LAB;  Service: Cardiovascular;  Laterality: N/A;   PROSTATE BIOPSY     TOE SURGERY     ingrown toe nail   Patient Active Problem List   Diagnosis Date Noted   Chronic anticoagulation 04/23/2023   Stroke (HCC)  04/06/2023   Type II diabetes mellitus with renal manifestations (HCC) 04/06/2023   Chronic kidney disease, stage 3a (HCC) 04/06/2023   COPD (chronic obstructive pulmonary disease) (HCC) 04/06/2023   Chronic diastolic CHF (congestive heart failure) (HCC) 04/06/2023   Permanent atrial fibrillation (HCC) 04/06/2023   Obesity (BMI 30-39.9) 04/06/2023   Acute cerebrovascular accident (CVA) (HCC) 12/09/2022   Chronic combined systolic and diastolic CHF (congestive heart failure) (HCC) 12/09/2022   Fall at home, initial encounter 12/09/2022   Hypokalemia 12/09/2022   Hemiparesis affecting right side as late effect of cerebrovascular accident (CVA) (HCC) 12/09/2022   Skin tear of left hand without complication 11/16/2022   Pain of left hip 11/16/2022   Left hip pain 11/10/2022   Intertrigo 11/10/2022   COPD with acute exacerbation (HCC) 10/06/2022   Diabetes mellitus (HCC) 06/10/2022   Enlarged prostate 03/02/2022   Other specified counseling 03/02/2022   Renal calculi 03/02/2022   Gastroesophageal reflux disease 03/02/2022   Hypertensive disorder 03/02/2022   Mixed hyperlipidemia 03/02/2022   Hypothyroidism 03/02/2022   Acute non-recurrent frontal sinusitis 01/05/2022   Glands swollen 01/05/2022   Hypertension associated with diabetes (HCC) 01/05/2022   Acute rhinitis  01/05/2022   AV block, Mobitz 1    NSTEMI (non-ST elevated myocardial infarction) [facility])    ACS (acute coronary syndrome) (HCC) 12/16/2019   Diverticulitis of large intestine without perforation or abscess without bleeding    Pain of upper abdomen    SOB (shortness of breath)    S/P CABG (coronary artery bypass graft)    Back pain, chronic 07/28/2015   Benign fibroma of prostate 07/28/2015   Acid reflux 07/28/2015   Adult hypothyroidism 07/28/2015   Neuropathy 07/28/2015   Arthritis, degenerative 07/28/2015   Psoriasis 07/28/2015   Lumbar spondylosis 05/08/2015   Obesity 09/09/2014   Bilateral leg weakness  09/09/2014   Acute combined systolic and diastolic heart failure (HCC) 05/13/2014   Bilateral leg edema 04/15/2014   Bradycardia 08/13/2013   Edema 06/21/2012   Weakness 09/15/2011   Type 2 diabetes mellitus with diabetic peripheral angiopathy without gangrene, with long-term current use of insulin (HCC) 09/15/2011   DYSPNEA 06/09/2009   Hyperlipidemia 06/07/2009   Essential hypertension 06/07/2009   Coronary artery disease 06/07/2009    ONSET DATE: Jan 2024  REFERRING DIAG:  R26.81 (ICD-10-CM) - Gait instability  R26.89 (ICD-10-CM) - Decreased mobility    THERAPY DIAG:  Unsteadiness on feet  Difficulty in walking, not elsewhere classified  Muscle weakness (generalized)  Rationale for Evaluation and Treatment: Rehabilitation  SUBJECTIVE:                                                                                                                                                                                             SUBJECTIVE STATEMENT: No changes since last seen.  Pt accompanied by:  caregiver  PERTINENT HISTORY: From Eval: Patient reports having 2 CVA last year (1 CVA affecting right and 1 affecting left side). Prior to CVA's he reports he was walking normally without an AD. States has been walking with walker ever since.   [facility]- Patient is a 89 y/o veteran presenting with primary c/o difficulty in walking. PMH significant for CVA in Jan 2024 and in May 2024 with some residual R sided weakness.   PAIN:  Are you having pain? No  PRECAUTIONS: Fall  RED FLAGS: None   WEIGHT BEARING RESTRICTIONS: No  FALLS: Has patient fallen in last 6 months? Yes. Number of falls 1  LIVING ENVIRONMENT: Lives with: lives alone and has private  caregivers - everyday  and [facility] caregivers -3x/week - approx 8 hours/day Lives in: House/apartment Stairs:  has ramp supplied by [place] Has following equipment at home: Single point cane, Environmental consultant - 2 wheeled,  Wheelchair (manual), Shower bench, bed side commode,  Grab bars, Ramped entry, and Transport w/c  PLOF: Independent  PATIENT GOALS: I want to walk without walker independently  OBJECTIVE:  Note: Objective measures were completed at Evaluation unless otherwise noted.  DIAGNOSTIC FINDINGS: CLINICAL DATA:  Stroke, follow up   EXAM: CT HEAD WITHOUT CONTRAST   TECHNIQUE: Contiguous axial images were obtained from the base of the skull through the vertex without intravenous contrast.   RADIATION DOSE REDUCTION: This exam was performed according to the departmental dose-optimization program which includes automated exposure control, adjustment of the mA and/or kV according to patient size and/or use of iterative reconstruction technique.   COMPARISON:  MR Brain 04/06/23, CT head 04/06/23   FINDINGS: Brain: No hemorrhage. No hydrocephalus. There are prominent subarachnoid spaces bilaterally, unchanged. There is sequela of moderate chronic microvascular ischemic change with a chronic infarct in the left corona radiata and an acute infarct in the right basal ganglia, better characterized on recent brain MRI.   Vascular: No hyperdense vessel or unexpected calcification.   Skull: Normal. Negative for fracture or focal lesion.   Sinuses/Orbits: No mastoid or middle ear effusion. Paranasal sinuses are clear. Bilateral lens replacement. Orbits are otherwise unremarkable.   Other: None.   IMPRESSION: 1. No acute intracranial hemorrhage. 2. Acute infarct in the right basal ganglia, better characterized on recent brain MRI.     Electronically Signed   By: Lorenza Cambridge M.D.   On: 04/07/2023 16:08  COGNITION: Overall cognitive status: Within functional limits for tasks assessed   SENSATION: WFL  COORDINATION: WNL  EDEMA:  Some edema noted with lower left LE    POSTURE: rounded shoulders and forward head  LOWER EXTREMITY ROM:     Active  Right Eval Left Eval  Hip  flexion    Hip extension    Hip abduction    Hip adduction    Hip internal rotation    Hip external rotation    Knee flexion    Knee extension    Ankle dorsiflexion    Ankle plantarflexion    Ankle inversion    Ankle eversion     (Blank rows = not tested)  LOWER EXTREMITY MMT:    MMT Right Eval Left Eval  Hip flexion 4 4  Hip extension    Hip abduction    Hip adduction    Hip internal rotation 4 4  Hip external rotation 4 4  Knee flexion 4 4  Knee extension 4 4  Ankle dorsiflexion 4 4  Ankle plantarflexion    Ankle inversion    Ankle eversion    (Blank rows = not tested)  BED MOBILITY:  NT but both patient and caregiver report no difficulty with bed mobility  TRANSFERS: Assistive device utilized: Environmental consultant - 2 wheeled  Sit to stand: CGA Stand to sit: CGA Chair to chair: CGA Floor:  not tested  GAIT: Gait pattern: decreased step length- Right and decreased step length- Left Distance walked: > 100 feet  Assistive device utilized: Walker - 2 wheeled Level of assistance: CGA Comments: anterior trunk lean  FUNCTIONAL TESTS:  5 times sit to stand: 22.32 sec without UE support Timed up and go (TUG): 27.94 sec  6 minute walk test: To be assessed visit # 2 10 meter walk test: 16.10 and 14.92 sec with RW=15.51 sec avg or 0.65 m/s using RW  Berg Balance Scale: To be assessed visit #2  PATIENT SURVEYS:  Stroke Impact Scale 52 ABC scale 37.5 %  TREATMENT DATE: 02/13/24  Unless otherwise stated, CGA/min A was provided and gait belt donned in order to ensure pt safety throughout session.  NMR: At support surface- Standing WBOS 30 sec  Standing NBOS 2x30 sec --repeats with vertical and horizontal head turns 10x for each - vertical more challenging, CGA-min a throughout Standing WBOS EC 30 sec Standing NBOS EC 2x30 sec   Standing alt toe tap  on 6" step with decreasing levels of UE support x multiple reps, rates tough   Gait training  -  Gait in // bars FWD/BCKWD and LTL stepping without AD, with intermittent UE x multiple reps of each - fatiguing, cuing for upright posture     Therapeutic activities:   Sit<>stands from mat  table with decreasing height of seat, no UE support, ,  x10,reps with CGA - continued cuing for increased anterior trunk lean without pushing backs of legs against mat as well as VC- Focus on eccentric control   Pt takes bathroom break x 5 min during session  PATIENT EDUCATION: Education details: exercise technique, activity recommendations  Person educated: Patient, DTR Education method: Explanation Education comprehension: verbalized understanding  HOME EXERCISE PROGRAM:  Access Code: [email or web address] URL: [email or web address] Date: 01/25/2024 Prepared by: Casimiro Needle  Exercises - Sit to Stand with Counter Support  - 1 x daily - 7 x weekly - 2 sets - 10 reps - Narrow Stance with Counter Support  - 1 x daily - 7 x weekly - 2 sets - 30 seconds hold - Narrow Stance with Head Nods and Counter Support  - 1 x daily - 7 x weekly - 2 sets - 10 reps   GOALS: Goals reviewed with patient? Yes  SHORT TERM GOALS: Target date: 02/29/2024  Pt will be independent with HEP in order to improve strength and balance in order to decrease fall risk and improve function at home and work.  Baseline: EVAL: no formal HEP in place Goal status: INITIAL   LONG TERM GOALS: Target date: 04/11/2024  1.  Patient (> 67 years old) will complete five times sit to stand test in < 15 seconds indicating an increased LE strength and improved balance. Baseline: EVAL: 22.32 sec without UE support Goal status: INITIAL  2.  Pt will improve ABC by at least 13% in order to demonstrate clinically significant improvement in balance confidence Baseline: EVAL: 37.5% Goal status: INITIAL   3.  Patient will increase Berg  Balance score by > 6 points to demonstrate decreased fall risk during functional activities. Baseline: 01/25/2024: 25/56 Goal status: INITIAL   4.   Patient will reduce timed up and go to <11 seconds to reduce fall risk and demonstrate improved transfer/gait ability. Baseline: EVAL: 27.94 with RW Goal status: INITIAL  5.   Patient will increase 10 meter walk test to >1.36m/s as to improve gait speed for better community ambulation and to reduce fall risk. Baseline: EVAL: 0.65 m/s Goal status: INITIAL  6.   Patient will increase six minute walk test distance to >1000 for progression to community ambulator and improve gait ability Baseline: EVAL: To be assessed  visit #2 Goal status: INITIAL  7.  Patient will improve score on Stroke Impact Scale 16 by 8 points to signify a higher level of functioning and less difficulty with daily activities.  Baseline: EVAL: 52 Goal status: INITIAL   ASSESSMENT:  CLINICAL IMPRESSION:  Focus of session today was on dynamic gait activities in // bars so pt could safely practice with limited to  no UE support. He generally required intermittent UE support throughout with FWD gait, and increased frequency of UE support with LTL stepping. Pt utilized UE support for retro-steps. All gait activities were fatiguing. The pt will benefit from skilled PT services to improve his overall functional strength and walking to enhance his independence and decrease his risk of falling.   OBJECTIVE IMPAIRMENTS: Abnormal gait, decreased activity tolerance, decreased balance, decreased endurance, decreased mobility, and decreased strength.   ACTIVITY LIMITATIONS: carrying, lifting, bending, standing, squatting, and stairs  PARTICIPATION LIMITATIONS: cleaning, laundry, driving, shopping, community activity, and yard work  PERSONAL FACTORS: Age and 3+ comorbidities: HTN, CHF, A-FIB, DM  are also affecting patient's functional outcome.   REHAB POTENTIAL: Good  CLINICAL  DECISION MAKING: Evolving/moderate complexity  EVALUATION COMPLEXITY: Moderate  PLAN:  PT FREQUENCY: 1-2x/week  PT DURATION: 12 weeks  PLANNED INTERVENTIONS: 97164- PT Re-evaluation, 97110-Therapeutic exercises, 97530- Therapeutic activity, 97112- Neuromuscular re-education, 97535- Self Care, 16109- Manual therapy, [phone]- Gait training, [phone]- Canalith repositioning, [phone]- Electrical stimulation (manual), Patient/Family education, Balance training, Stair training, Dry Needling, Joint mobilization, Vestibular training, DME instructions, Cryotherapy, and Moist heat  PLAN FOR NEXT SESSION:  - 6 min walk test  - review new HEP - balance interventions including: narrow BOS, reaching/turning/rotating, eyes closed, and alternating foot taps - gait training without AD - implement therex/theract/gait training Continue plan  Temple Pacini PT, DPT  Physical Therapist - [place]  [facility]  1:50 PM 02/13/24

## 2024-02-14 ENCOUNTER — Ambulatory Visit: Payer: PPO

## 2024-02-15 ENCOUNTER — Ambulatory Visit

## 2024-02-15 DIAGNOSIS — R2681 Unsteadiness on feet: Secondary | ICD-10-CM

## 2024-02-15 DIAGNOSIS — M6281 Muscle weakness (generalized): Secondary | ICD-10-CM

## 2024-02-15 DIAGNOSIS — R262 Difficulty in walking, not elsewhere classified: Secondary | ICD-10-CM

## 2024-02-15 DIAGNOSIS — R269 Unspecified abnormalities of gait and mobility: Secondary | ICD-10-CM | POA: Diagnosis not present

## 2024-02-15 NOTE — Therapy (Unsigned)
 OUTPATIENT PHYSICAL THERAPY NEURO TREATMENT   Patient Name: Joseph [name] MRN: [id number] DOB:Oct 27, 1925, 89 y.o., male Today's Date: 02/16/2024   PCP: Dr. Julieanne Manson REFERRING PROVIDER: Toya Smothers, PA  END OF SESSION:    PT End of Session - 02/16/24 1232     Visit Number 7    Number of Visits 24    Date for PT Re-Evaluation 04/11/24    Progress Note Due on Visit 10    PT Start Time 1535    PT Stop Time 1615    PT Time Calculation (min) 40 min    Equipment Utilized During Treatment Gait belt    Activity Tolerance Patient tolerated treatment well    Behavior During Therapy WFL for tasks assessed/performed                   Past Medical History:  Diagnosis Date   Acute respiratory failure with hypoxia (HCC) 03/21/2016   Atrial fibrillation (HCC) 03/22/2023   CAD (coronary artery disease)    a. s/p CABG;  b. 07/2012 Cath: 3VD including 80 dLCX (med Rx), 4/4 patent grafts.   Chronic diastolic CHF (congestive heart failure) (HCC)    a. 07/2012 Echo: EF 55-60%, no rwma, Gr 1 DD, mild MR;  04/2014 Echo: EF 55-60%, no rwma, mild MR, mildly dil LA.   Diabetes mellitus, type 2 (HCC)    HTN (hypertension)    Hyperlipidemia    Hypothyroidism    Lower extremity edema    a. 03/2014 amlodipine d/c'd.   Spinal stenosis    Stroke (HCC)    1/24 and 5/24   Past Surgical History:  Procedure Laterality Date   APPENDECTOMY     CARDIAC CATHETERIZATION  07/2012   [facility]/no stents   CHOLECYSTECTOMY     CORNEAL TRANSPLANT     CORONARY ARTERY BYPASS GRAFT  1994   HEMORRHOID SURGERY     LEFT HEART CATH AND CORS/GRAFTS ANGIOGRAPHY N/A 12/17/2019   Procedure: LEFT HEART CATH AND CORS/GRAFTS ANGIOGRAPHY;  Surgeon: Yvonne Kendall, MD;  Location: [facility] INVASIVE CV LAB;  Service: Cardiovascular;  Laterality: N/A;   PROSTATE BIOPSY     TOE SURGERY     ingrown toe nail   Patient Active Problem List   Diagnosis Date Noted   Chronic anticoagulation 04/23/2023   Stroke (HCC)  04/06/2023   Type II diabetes mellitus with renal manifestations (HCC) 04/06/2023   Chronic kidney disease, stage 3a (HCC) 04/06/2023   COPD (chronic obstructive pulmonary disease) (HCC) 04/06/2023   Chronic diastolic CHF (congestive heart failure) (HCC) 04/06/2023   Permanent atrial fibrillation (HCC) 04/06/2023   Obesity (BMI 30-39.9) 04/06/2023   Acute cerebrovascular accident (CVA) (HCC) 12/09/2022   Chronic combined systolic and diastolic CHF (congestive heart failure) (HCC) 12/09/2022   Fall at home, initial encounter 12/09/2022   Hypokalemia 12/09/2022   Hemiparesis affecting right side as late effect of cerebrovascular accident (CVA) (HCC) 12/09/2022   Skin tear of left hand without complication 11/16/2022   Pain of left hip 11/16/2022   Left hip pain 11/10/2022   Intertrigo 11/10/2022   COPD with acute exacerbation (HCC) 10/06/2022   Diabetes mellitus (HCC) 06/10/2022   Enlarged prostate 03/02/2022   Other specified counseling 03/02/2022   Renal calculi 03/02/2022   Gastroesophageal reflux disease 03/02/2022   Hypertensive disorder 03/02/2022   Mixed hyperlipidemia 03/02/2022   Hypothyroidism 03/02/2022   Acute non-recurrent frontal sinusitis 01/05/2022   Glands swollen 01/05/2022   Hypertension associated with diabetes (HCC) 01/05/2022   Acute  rhinitis 01/05/2022   AV block, Mobitz 1    NSTEMI (non-ST elevated myocardial infarction) [facility])    ACS (acute coronary syndrome) (HCC) 12/16/2019   Diverticulitis of large intestine without perforation or abscess without bleeding    Pain of upper abdomen    SOB (shortness of breath)    S/P CABG (coronary artery bypass graft)    Back pain, chronic 07/28/2015   Benign fibroma of prostate 07/28/2015   Acid reflux 07/28/2015   Adult hypothyroidism 07/28/2015   Neuropathy 07/28/2015   Arthritis, degenerative 07/28/2015   Psoriasis 07/28/2015   Lumbar spondylosis 05/08/2015   Obesity 09/09/2014   Bilateral leg weakness  09/09/2014   Acute combined systolic and diastolic heart failure (HCC) 05/13/2014   Bilateral leg edema 04/15/2014   Bradycardia 08/13/2013   Edema 06/21/2012   Weakness 09/15/2011   Type 2 diabetes mellitus with diabetic peripheral angiopathy without gangrene, with long-term current use of insulin (HCC) 09/15/2011   DYSPNEA 06/09/2009   Hyperlipidemia 06/07/2009   Essential hypertension 06/07/2009   Coronary artery disease 06/07/2009    ONSET DATE: Jan 2024  REFERRING DIAG:  R26.81 (ICD-10-CM) - Gait instability  R26.89 (ICD-10-CM) - Decreased mobility    THERAPY DIAG:  Muscle weakness (generalized)  Difficulty in walking, not elsewhere classified  Unsteadiness on feet  Rationale for Evaluation and Treatment: Rehabilitation  SUBJECTIVE:                                                                                                                                                                                             SUBJECTIVE STATEMENT:  Pt increasing time ambulating at home. No other updates.  Pt accompanied by:  caregiver  PERTINENT HISTORY: From Eval: Patient reports having 2 CVA last year (1 CVA affecting right and 1 affecting left side). Prior to CVA's he reports he was walking normally without an AD. States has been walking with walker ever since.   [facility]- Patient is a 89 y/o veteran presenting with primary c/o difficulty in walking. PMH significant for CVA in Jan 2024 and in May 2024 with some residual R sided weakness.   PAIN:  Are you having pain? No  PRECAUTIONS: Fall  RED FLAGS: None   WEIGHT BEARING RESTRICTIONS: No  FALLS: Has patient fallen in last 6 months? Yes. Number of falls 1  LIVING ENVIRONMENT: Lives with: lives alone and has private  caregivers - everyday  and [facility] caregivers -3x/week - approx 8 hours/day Lives in: House/apartment Stairs:  has ramp supplied by [place] Has following equipment at home: Single point  cane, Environmental consultant - 2 wheeled, Wheelchair (  manual), Shower bench, bed side commode, Grab bars, Ramped entry, and Transport w/c  PLOF: Independent  PATIENT GOALS: I want to walk without walker independently  OBJECTIVE:  Note: Objective measures were completed at Evaluation unless otherwise noted.  DIAGNOSTIC FINDINGS: CLINICAL DATA:  Stroke, follow up   EXAM: CT HEAD WITHOUT CONTRAST   TECHNIQUE: Contiguous axial images were obtained from the base of the skull through the vertex without intravenous contrast.   RADIATION DOSE REDUCTION: This exam was performed according to the departmental dose-optimization program which includes automated exposure control, adjustment of the mA and/or kV according to patient size and/or use of iterative reconstruction technique.   COMPARISON:  MR Brain 04/06/23, CT head 04/06/23   FINDINGS: Brain: No hemorrhage. No hydrocephalus. There are prominent subarachnoid spaces bilaterally, unchanged. There is sequela of moderate chronic microvascular ischemic change with a chronic infarct in the left corona radiata and an acute infarct in the right basal ganglia, better characterized on recent brain MRI.   Vascular: No hyperdense vessel or unexpected calcification.   Skull: Normal. Negative for fracture or focal lesion.   Sinuses/Orbits: No mastoid or middle ear effusion. Paranasal sinuses are clear. Bilateral lens replacement. Orbits are otherwise unremarkable.   Other: None.   IMPRESSION: 1. No acute intracranial hemorrhage. 2. Acute infarct in the right basal ganglia, better characterized on recent brain MRI.     Electronically Signed   By: Lorenza Cambridge M.D.   On: 04/07/2023 16:08  COGNITION: Overall cognitive status: Within functional limits for tasks assessed   SENSATION: WFL  COORDINATION: WNL  EDEMA:  Some edema noted with lower left LE    POSTURE: rounded shoulders and forward head  LOWER EXTREMITY ROM:     Active   Right Eval Left Eval  Hip flexion    Hip extension    Hip abduction    Hip adduction    Hip internal rotation    Hip external rotation    Knee flexion    Knee extension    Ankle dorsiflexion    Ankle plantarflexion    Ankle inversion    Ankle eversion     (Blank rows = not tested)  LOWER EXTREMITY MMT:    MMT Right Eval Left Eval  Hip flexion 4 4  Hip extension    Hip abduction    Hip adduction    Hip internal rotation 4 4  Hip external rotation 4 4  Knee flexion 4 4  Knee extension 4 4  Ankle dorsiflexion 4 4  Ankle plantarflexion    Ankle inversion    Ankle eversion    (Blank rows = not tested)  BED MOBILITY:  NT but both patient and caregiver report no difficulty with bed mobility  TRANSFERS: Assistive device utilized: Environmental consultant - 2 wheeled  Sit to stand: CGA Stand to sit: CGA Chair to chair: CGA Floor:  not tested  GAIT: Gait pattern: decreased step length- Right and decreased step length- Left Distance walked: > 100 feet  Assistive device utilized: Walker - 2 wheeled Level of assistance: CGA Comments: anterior trunk lean  FUNCTIONAL TESTS:  5 times sit to stand: 22.32 sec without UE support Timed up and go (TUG): 27.94 sec  6 minute walk test: To be assessed visit # 2 10 meter walk test: 16.10 and 14.92 sec with RW=15.51 sec avg or 0.65 m/s using RW  Berg Balance Scale: To be assessed visit #2  PATIENT SURVEYS:  Stroke Impact Scale 52 ABC scale 37.5 %  TREATMENT DATE: 02/16/24  Unless otherwise stated, CGA/min A was provided and gait belt donned in order to ensure pt safety throughout session.  Gait training  - Gait with RW and 2# aw x 444 ft and cuing for upright posture  --second round cuing for increased speed x 296 ft - some UE fatigue from increased UE weightbearing   TA: STS 2x10 hands-free from standard chair  -  HR reaches 74 bpm post intervention   LTL stepping with 2# aw at support surface x multiple reps each direction, mild decrease in balance but able to self-correct  LTL step over half foam with 2# aw at support surface x multiple reps. Initial difficulty with increased step-length. PT provided multi-modal cues including demo, and pt showed within-session improvement following cuing   PATIENT EDUCATION: Education details: exercise technique Person educated: Patient,  Education method: Explanation Education comprehension: verbalized understanding  HOME EXERCISE PROGRAM:  Access Code: [email or web address] URL: [email or web address] Date: 01/25/2024 Prepared by: Casimiro Needle  Exercises - Sit to Stand with Counter Support  - 1 x daily - 7 x weekly - 2 sets - 10 reps - Narrow Stance with Counter Support  - 1 x daily - 7 x weekly - 2 sets - 30 seconds hold - Narrow Stance with Head Nods and Counter Support  - 1 x daily - 7 x weekly - 2 sets - 10 reps   GOALS: Goals reviewed with patient? Yes  SHORT TERM GOALS: Target date: 02/29/2024  Pt will be independent with HEP in order to improve strength and balance in order to decrease fall risk and improve function at home and work.  Baseline: EVAL: no formal HEP in place Goal status: INITIAL   LONG TERM GOALS: Target date: 04/11/2024  1.  Patient (> 28 years old) will complete five times sit to stand test in < 15 seconds indicating an increased LE strength and improved balance. Baseline: EVAL: 22.32 sec without UE support Goal status: INITIAL  2.  Pt will improve ABC by at least 13% in order to demonstrate clinically significant improvement in balance confidence Baseline: EVAL: 37.5% Goal status: INITIAL   3.  Patient will increase Berg Balance score by > 6 points to demonstrate decreased fall risk during functional activities. Baseline: 01/25/2024: 25/56 Goal status: INITIAL   4.   Patient will reduce timed up and go to <11 seconds  to reduce fall risk and demonstrate improved transfer/gait ability. Baseline: EVAL: 27.94 with RW Goal status: INITIAL  5.   Patient will increase 10 meter walk test to >1.18m/s as to improve gait speed for better community ambulation and to reduce fall risk. Baseline: EVAL: 0.65 m/s Goal status: INITIAL  6.   Patient will increase six minute walk test distance to >1000 for progression to community ambulator and improve gait ability Baseline: EVAL: To be assessed  visit #2 Goal status: INITIAL  7.  Patient will improve score on Stroke Impact Scale 16 by 8 points to signify a higher level of functioning and less difficulty with daily activities.  Baseline: EVAL: 52 Goal status: INITIAL   ASSESSMENT:  CLINICAL IMPRESSION:  Pt making gains with strength/mobility AEB ability to complete multiple reps of STS hands-free from standard chair height. He is also progressing his gait endurance. He struggles some with step-length intervention, but does show within-session ability to improve this following cues. The pt will benefit from skilled PT services to improve his overall functional strength and walking to enhance his independence and decrease his  risk of falling.   OBJECTIVE IMPAIRMENTS: Abnormal gait, decreased activity tolerance, decreased balance, decreased endurance, decreased mobility, and decreased strength.   ACTIVITY LIMITATIONS: carrying, lifting, bending, standing, squatting, and stairs  PARTICIPATION LIMITATIONS: cleaning, laundry, driving, shopping, community activity, and yard work  PERSONAL FACTORS: Age and 3+ comorbidities: HTN, CHF, A-FIB, DM  are also affecting patient's functional outcome.   REHAB POTENTIAL: Good  CLINICAL DECISION MAKING: Evolving/moderate complexity  EVALUATION COMPLEXITY: Moderate  PLAN:  PT FREQUENCY: 1-2x/week  PT DURATION: 12 weeks  PLANNED INTERVENTIONS: 97164- PT Re-evaluation, 97110-Therapeutic exercises, 97530- Therapeutic activity,  97112- Neuromuscular re-education, 97535- Self Care, 13086- Manual therapy, [phone]- Gait training, [phone]- Canalith repositioning, [phone]- Electrical stimulation (manual), Patient/Family education, Balance training, Stair training, Dry Needling, Joint mobilization, Vestibular training, DME instructions, Cryotherapy, and Moist heat  PLAN FOR NEXT SESSION:  - 6 min walk test  - review new HEP - balance interventions including: narrow BOS, reaching/turning/rotating, eyes closed, and alternating foot taps - gait training without AD - implement therex/theract/gait training Continue plan  Temple Pacini PT, DPT  Physical Therapist - [facility]  [facility]  12:33 PM 02/16/24

## 2024-02-19 ENCOUNTER — Ambulatory Visit: Payer: PPO

## 2024-02-19 NOTE — Therapy (Signed)
 OUTPATIENT PHYSICAL THERAPY NEURO TREATMENT   Patient Name: Joseph [name] MRN: [id number] DOB:[birth year], 89 y.o., male Today's Date: 02/20/2024   PCP: Dr. Julieanne Manson REFERRING PROVIDER: Toya Smothers, PA  END OF SESSION:    PT End of Session - 02/20/24 1144     Visit Number 8    Number of Visits 24    Date for PT Re-Evaluation 04/11/24    Progress Note Due on Visit 10    PT Start Time 1145    PT Stop Time 1226    PT Time Calculation (min) 41 min    Equipment Utilized During Treatment Gait belt    Activity Tolerance Patient tolerated treatment well    Behavior During Therapy WFL for tasks assessed/performed                    Past Medical History:  Diagnosis Date   Acute respiratory failure with hypoxia (HCC) 03/21/2016   Atrial fibrillation (HCC) 03/22/2023   CAD (coronary artery disease)    a. s/p CABG;  b. 07/2012 Cath: 3VD including 80 dLCX (med Rx), 4/4 patent grafts.   Chronic diastolic CHF (congestive heart failure) (HCC)    a. 07/2012 Echo: EF 55-60%, no rwma, Gr 1 DD, mild MR;  04/2014 Echo: EF 55-60%, no rwma, mild MR, mildly dil LA.   Diabetes mellitus, type 2 (HCC)    HTN (hypertension)    Hyperlipidemia    Hypothyroidism    Lower extremity edema    a. 03/2014 amlodipine d/c'd.   Spinal stenosis    Stroke (HCC)    1/24 and 5/24   Past Surgical History:  Procedure Laterality Date   APPENDECTOMY     CARDIAC CATHETERIZATION  07/2012   [facility]/no stents   CHOLECYSTECTOMY     CORNEAL TRANSPLANT     CORONARY ARTERY BYPASS GRAFT  1994   HEMORRHOID SURGERY     LEFT HEART CATH AND CORS/GRAFTS ANGIOGRAPHY N/A 12/17/2019   Procedure: LEFT HEART CATH AND CORS/GRAFTS ANGIOGRAPHY;  Surgeon: Yvonne Kendall, MD;  Location: [facility] INVASIVE CV LAB;  Service: Cardiovascular;  Laterality: N/A;   PROSTATE BIOPSY     TOE SURGERY     ingrown toe nail   Patient Active Problem List   Diagnosis Date Noted   Chronic anticoagulation 04/23/2023   Stroke (HCC)  04/06/2023   Type II diabetes mellitus with renal manifestations (HCC) 04/06/2023   Chronic kidney disease, stage 3a (HCC) 04/06/2023   COPD (chronic obstructive pulmonary disease) (HCC) 04/06/2023   Chronic diastolic CHF (congestive heart failure) (HCC) 04/06/2023   Permanent atrial fibrillation (HCC) 04/06/2023   Obesity (BMI 30-39.9) 04/06/2023   Acute cerebrovascular accident (CVA) (HCC) 12/09/2022   Chronic combined systolic and diastolic CHF (congestive heart failure) (HCC) 12/09/2022   Fall at home, initial encounter 12/09/2022   Hypokalemia 12/09/2022   Hemiparesis affecting right side as late effect of cerebrovascular accident (CVA) (HCC) 12/09/2022   Skin tear of left hand without complication 11/16/2022   Pain of left hip 11/16/2022   Left hip pain 11/10/2022   Intertrigo 11/10/2022   COPD with acute exacerbation (HCC) 10/06/2022   Diabetes mellitus (HCC) 06/10/2022   Enlarged prostate 03/02/2022   Other specified counseling 03/02/2022   Renal calculi 03/02/2022   Gastroesophageal reflux disease 03/02/2022   Hypertensive disorder 03/02/2022   Mixed hyperlipidemia 03/02/2022   Hypothyroidism 03/02/2022   Acute non-recurrent frontal sinusitis 01/05/2022   Glands swollen 01/05/2022   Hypertension associated with diabetes (HCC) 01/05/2022  Acute rhinitis 01/05/2022   AV block, Mobitz 1    NSTEMI (non-ST elevated myocardial infarction) [facility])    ACS (acute coronary syndrome) (HCC) 12/16/2019   Diverticulitis of large intestine without perforation or abscess without bleeding    Pain of upper abdomen    SOB (shortness of breath)    S/P CABG (coronary artery bypass graft)    Back pain, chronic 07/28/2015   Benign fibroma of prostate 07/28/2015   Acid reflux 07/28/2015   Adult hypothyroidism 07/28/2015   Neuropathy 07/28/2015   Arthritis, degenerative 07/28/2015   Psoriasis 07/28/2015   Lumbar spondylosis 05/08/2015   Obesity 09/09/2014   Bilateral leg weakness  09/09/2014   Acute combined systolic and diastolic heart failure (HCC) 05/13/2014   Bilateral leg edema 04/15/2014   Bradycardia 08/13/2013   Edema 06/21/2012   Weakness 09/15/2011   Type 2 diabetes mellitus with diabetic peripheral angiopathy without gangrene, with long-term current use of insulin (HCC) 09/15/2011   DYSPNEA 06/09/2009   Hyperlipidemia 06/07/2009   Essential hypertension 06/07/2009   Coronary artery disease 06/07/2009    ONSET DATE: Jan 2024  REFERRING DIAG:  R26.81 (ICD-10-CM) - Gait instability  R26.89 (ICD-10-CM) - Decreased mobility    THERAPY DIAG:  Muscle weakness (generalized)  Difficulty in walking, not elsewhere classified  Unsteadiness on feet  Abnormality of gait  Other lack of coordination  Rationale for Evaluation and Treatment: Rehabilitation  SUBJECTIVE:                                                                                                                                                                                             SUBJECTIVE STATEMENT:   Patient reports he is walking as much as he's able.   Pt accompanied by:  caregiver  PERTINENT HISTORY: From Eval: Patient reports having 2 CVA last year (1 CVA affecting right and 1 affecting left side). Prior to CVA's he reports he was walking normally without an AD. States has been walking with Nakita Santerre ever since.   [facility]- Patient is a 89 y/o veteran presenting with primary c/o difficulty in walking. PMH significant for CVA in Jan 2024 and in May 2024 with some residual R sided weakness.   PAIN:  Are you having pain? No  PRECAUTIONS: Fall  RED FLAGS: None   WEIGHT BEARING RESTRICTIONS: No  FALLS: Has patient fallen in last 6 months? Yes. Number of falls 1  LIVING ENVIRONMENT: Lives with: lives alone and has private  caregivers - everyday  and [facility] caregivers -3x/week - approx 8 hours/day Lives in: House/apartment Stairs:  has ramp supplied by  [place]  Has following equipment at home: Single point cane, Charnise Lovan - 2 wheeled, Wheelchair (manual), Shower bench, bed side commode, Grab bars, Ramped entry, and Transport w/c  PLOF: Independent  PATIENT GOALS: I want to walk without Makenzie Weisner independently  OBJECTIVE:  Note: Objective measures were completed at Evaluation unless otherwise noted.  DIAGNOSTIC FINDINGS: CLINICAL DATA:  Stroke, follow up   EXAM: CT HEAD WITHOUT CONTRAST   TECHNIQUE: Contiguous axial images were obtained from the base of the skull through the vertex without intravenous contrast.   RADIATION DOSE REDUCTION: This exam was performed according to the departmental dose-optimization program which includes automated exposure control, adjustment of the mA and/or kV according to patient size and/or use of iterative reconstruction technique.   COMPARISON:  MR Brain 04/06/23, CT head 04/06/23   FINDINGS: Brain: No hemorrhage. No hydrocephalus. There are prominent subarachnoid spaces bilaterally, unchanged. There is sequela of moderate chronic microvascular ischemic change with a chronic infarct in the left corona radiata and an acute infarct in the right basal ganglia, better characterized on recent brain MRI.   Vascular: No hyperdense vessel or unexpected calcification.   Skull: Normal. Negative for fracture or focal lesion.   Sinuses/Orbits: No mastoid or middle ear effusion. Paranasal sinuses are clear. Bilateral lens replacement. Orbits are otherwise unremarkable.   Other: None.   IMPRESSION: 1. No acute intracranial hemorrhage. 2. Acute infarct in the right basal ganglia, better characterized on recent brain MRI.     Electronically Signed   By: Lorenza Cambridge M.D.   On: 04/07/2023 16:08  COGNITION: Overall cognitive status: Within functional limits for tasks assessed   SENSATION: WFL  COORDINATION: WNL  EDEMA:  Some edema noted with lower left LE    POSTURE: rounded shoulders and forward  head  LOWER EXTREMITY ROM:     Active  Right Eval Left Eval  Hip flexion    Hip extension    Hip abduction    Hip adduction    Hip internal rotation    Hip external rotation    Knee flexion    Knee extension    Ankle dorsiflexion    Ankle plantarflexion    Ankle inversion    Ankle eversion     (Blank rows = not tested)  LOWER EXTREMITY MMT:    MMT Right Eval Left Eval  Hip flexion 4 4  Hip extension    Hip abduction    Hip adduction    Hip internal rotation 4 4  Hip external rotation 4 4  Knee flexion 4 4  Knee extension 4 4  Ankle dorsiflexion 4 4  Ankle plantarflexion    Ankle inversion    Ankle eversion    (Blank rows = not tested)  BED MOBILITY:  NT but both patient and caregiver report no difficulty with bed mobility  TRANSFERS: Assistive device utilized: Environmental consultant - 2 wheeled  Sit to stand: CGA Stand to sit: CGA Chair to chair: CGA Floor:  not tested  GAIT: Gait pattern: decreased step length- Right and decreased step length- Left Distance walked: > 100 feet  Assistive device utilized: Adaleena Mooers - 2 wheeled Level of assistance: CGA Comments: anterior trunk lean  FUNCTIONAL TESTS:  5 times sit to stand: 22.32 sec without UE support Timed up and go (TUG): 27.94 sec  6 minute walk test: To be assessed visit # 2 10 meter walk test: 16.10 and 14.92 sec with RW=15.51 sec avg or 0.65 m/s using RW  Berg Balance Scale: To be assessed visit #2  PATIENT SURVEYS:  Stroke Impact Scale 52 ABC scale 37.5 %                                                                                                                              TREATMENT DATE: 02/20/24   Unless otherwise stated, CGA/min A was provided and gait belt donned in order to ensure pt safety throughout session.  TA: Gait with RW and 2# aw x 300 ft and cuing for upright posture  -some UE fatigue from increased UE weightbearing   STS 2x10 hands-free from standard chair  -  Seated LAQ 2# AW x 10  with 3 second hold each LE   Standing marching with 2# AW and RW x 10 each LE  Sidestepping in // bars x 3 laps D/B without UE support  Fwd walking in // bars with no UE support x 2 laps D/B - UE in high guard position throughout   Ambulation with no AD ~50' with minA for balance to transport w/c  PATIENT EDUCATION: Education details: exercise technique Person educated: Patient,  Education method: Explanation Education comprehension: verbalized understanding  HOME EXERCISE PROGRAM:  Access Code: [email or web address] URL: [email or web address] Date: 01/25/2024 Prepared by: Casimiro Needle  Exercises - Sit to Stand with Counter Support  - 1 x daily - 7 x weekly - 2 sets - 10 reps - Narrow Stance with Counter Support  - 1 x daily - 7 x weekly - 2 sets - 30 seconds hold - Narrow Stance with Head Nods and Counter Support  - 1 x daily - 7 x weekly - 2 sets - 10 reps   GOALS: Goals reviewed with patient? Yes  SHORT TERM GOALS: Target date: 02/29/2024  Pt will be independent with HEP in order to improve strength and balance in order to decrease fall risk and improve function at home and work.  Baseline: EVAL: no formal HEP in place Goal status: INITIAL   LONG TERM GOALS: Target date: 04/11/2024  1.  Patient (> 75 years old) will complete five times sit to stand test in < 15 seconds indicating an increased LE strength and improved balance. Baseline: EVAL: 22.32 sec without UE support Goal status: INITIAL  2.  Pt will improve ABC by at least 13% in order to demonstrate clinically significant improvement in balance confidence Baseline: EVAL: 37.5% Goal status: INITIAL   3.  Patient will increase Berg Balance score by > 6 points to demonstrate decreased fall risk during functional activities. Baseline: 01/25/2024: 25/56 Goal status: INITIAL   4.   Patient will reduce timed up and go to <11 seconds to reduce fall risk and demonstrate improved transfer/gait ability. Baseline: EVAL:  27.94 with RW Goal status: INITIAL  5.   Patient will increase 10 meter walk test to >1.42m/s as to improve gait speed for better community ambulation and to reduce fall risk. Baseline: EVAL: 0.65 m/s Goal status: INITIAL  6.   Patient will increase  six minute walk test distance to >1000 for progression to community ambulator and improve gait ability Baseline: EVAL: To be assessed  visit #2 Goal status: INITIAL  7.  Patient will improve score on Stroke Impact Scale 16 by 8 points to signify a higher level of functioning and less difficulty with daily activities.  Baseline: EVAL: 52 Goal status: INITIAL   ASSESSMENT:  CLINICAL IMPRESSION:   Session focused on BLE strengthening and endurance with good tolerance. Able to ambulate 1' with no AD with minA at end of session. The pt will benefit from skilled PT services to improve his overall functional strength and walking to enhance his independence and decrease his risk of falling.   OBJECTIVE IMPAIRMENTS: Abnormal gait, decreased activity tolerance, decreased balance, decreased endurance, decreased mobility, and decreased strength.   ACTIVITY LIMITATIONS: carrying, lifting, bending, standing, squatting, and stairs  PARTICIPATION LIMITATIONS: cleaning, laundry, driving, shopping, community activity, and yard work  PERSONAL FACTORS: Age and 3+ comorbidities: HTN, CHF, A-FIB, DM  are also affecting patient's functional outcome.   REHAB POTENTIAL: Good  CLINICAL DECISION MAKING: Evolving/moderate complexity  EVALUATION COMPLEXITY: Moderate  PLAN:  PT FREQUENCY: 1-2x/week  PT DURATION: 12 weeks  PLANNED INTERVENTIONS: 97164- PT Re-evaluation, 97110-Therapeutic exercises, 97530- Therapeutic activity, 97112- Neuromuscular re-education, 97535- Self Care, 62952- Manual therapy, [phone]- Gait training, [phone]- Canalith repositioning, [phone]- Electrical stimulation (manual), Patient/Family education, Balance training, Stair training, Dry  Needling, Joint mobilization, Vestibular training, DME instructions, Cryotherapy, and Moist heat  PLAN FOR NEXT SESSION:  - 6 min walk test  - review new HEP - balance interventions including: narrow BOS, reaching/turning/rotating, eyes closed, and alternating foot taps - gait training without AD - implement therex/theract/gait training Continue plan  Maylon Peppers, PT, DPT  Physical Therapist - [place]  [facility]  11:45 AM 02/20/24

## 2024-02-20 ENCOUNTER — Encounter: Payer: Self-pay | Admitting: Physical Therapy

## 2024-02-20 ENCOUNTER — Ambulatory Visit: Payer: PPO | Attending: Physician Assistant

## 2024-02-20 DIAGNOSIS — M6281 Muscle weakness (generalized): Secondary | ICD-10-CM | POA: Insufficient documentation

## 2024-02-20 DIAGNOSIS — R262 Difficulty in walking, not elsewhere classified: Secondary | ICD-10-CM | POA: Diagnosis present

## 2024-02-20 DIAGNOSIS — I69398 Other sequelae of cerebral infarction: Secondary | ICD-10-CM | POA: Diagnosis present

## 2024-02-20 DIAGNOSIS — R2689 Other abnormalities of gait and mobility: Secondary | ICD-10-CM | POA: Insufficient documentation

## 2024-02-20 DIAGNOSIS — R269 Unspecified abnormalities of gait and mobility: Secondary | ICD-10-CM | POA: Diagnosis present

## 2024-02-20 DIAGNOSIS — R2681 Unsteadiness on feet: Secondary | ICD-10-CM | POA: Insufficient documentation

## 2024-02-20 DIAGNOSIS — R278 Other lack of coordination: Secondary | ICD-10-CM | POA: Insufficient documentation

## 2024-02-21 ENCOUNTER — Ambulatory Visit: Payer: PPO

## 2024-02-22 ENCOUNTER — Ambulatory Visit

## 2024-02-22 DIAGNOSIS — R262 Difficulty in walking, not elsewhere classified: Secondary | ICD-10-CM

## 2024-02-22 DIAGNOSIS — M6281 Muscle weakness (generalized): Secondary | ICD-10-CM

## 2024-02-22 DIAGNOSIS — R2681 Unsteadiness on feet: Secondary | ICD-10-CM

## 2024-02-22 NOTE — Therapy (Signed)
 OUTPATIENT PHYSICAL THERAPY NEURO TREATMENT   Patient Name: Joseph [name] MRN: [id number] DOB:[birth year], 89 y.o., male Today's Date: 02/22/2024   PCP: Dr. Julieanne Manson REFERRING PROVIDER: Toya Smothers, PA  END OF SESSION:    PT End of Session - 02/22/24 1532     Visit Number 9    Number of Visits 24    Date for PT Re-Evaluation 04/11/24    Progress Note Due on Visit 10    PT Start Time 1535    PT Stop Time 1611    PT Time Calculation (min) 36 min    Equipment Utilized During Treatment Gait belt    Activity Tolerance Patient tolerated treatment well    Behavior During Therapy WFL for tasks assessed/performed                    Past Medical History:  Diagnosis Date   Acute respiratory failure with hypoxia (HCC) 03/21/2016   Atrial fibrillation (HCC) 03/22/2023   CAD (coronary artery disease)    a. s/p CABG;  b. 07/2012 Cath: 3VD including 80 dLCX (med Rx), 4/4 patent grafts.   Chronic diastolic CHF (congestive heart failure) (HCC)    a. 07/2012 Echo: EF 55-60%, no rwma, Gr 1 DD, mild MR;  04/2014 Echo: EF 55-60%, no rwma, mild MR, mildly dil LA.   Diabetes mellitus, type 2 (HCC)    HTN (hypertension)    Hyperlipidemia    Hypothyroidism    Lower extremity edema    a. 03/2014 amlodipine d/c'd.   Spinal stenosis    Stroke (HCC)    1/24 and 5/24   Past Surgical History:  Procedure Laterality Date   APPENDECTOMY     CARDIAC CATHETERIZATION  07/2012   [facility]/no stents   CHOLECYSTECTOMY     CORNEAL TRANSPLANT     CORONARY ARTERY BYPASS GRAFT  1994   HEMORRHOID SURGERY     LEFT HEART CATH AND CORS/GRAFTS ANGIOGRAPHY N/A 12/17/2019   Procedure: LEFT HEART CATH AND CORS/GRAFTS ANGIOGRAPHY;  Surgeon: Yvonne Kendall, MD;  Location: [facility] INVASIVE CV LAB;  Service: Cardiovascular;  Laterality: N/A;   PROSTATE BIOPSY     TOE SURGERY     ingrown toe nail   Patient Active Problem List   Diagnosis Date Noted   Chronic anticoagulation 04/23/2023   Stroke (HCC)  04/06/2023   Type II diabetes mellitus with renal manifestations (HCC) 04/06/2023   Chronic kidney disease, stage 3a (HCC) 04/06/2023   COPD (chronic obstructive pulmonary disease) (HCC) 04/06/2023   Chronic diastolic CHF (congestive heart failure) (HCC) 04/06/2023   Permanent atrial fibrillation (HCC) 04/06/2023   Obesity (BMI 30-39.9) 04/06/2023   Acute cerebrovascular accident (CVA) (HCC) 12/09/2022   Chronic combined systolic and diastolic CHF (congestive heart failure) (HCC) 12/09/2022   Fall at home, initial encounter 12/09/2022   Hypokalemia 12/09/2022   Hemiparesis affecting right side as late effect of cerebrovascular accident (CVA) (HCC) 12/09/2022   Skin tear of left hand without complication 11/16/2022   Pain of left hip 11/16/2022   Left hip pain 11/10/2022   Intertrigo 11/10/2022   COPD with acute exacerbation (HCC) 10/06/2022   Diabetes mellitus (HCC) 06/10/2022   Enlarged prostate 03/02/2022   Other specified counseling 03/02/2022   Renal calculi 03/02/2022   Gastroesophageal reflux disease 03/02/2022   Hypertensive disorder 03/02/2022   Mixed hyperlipidemia 03/02/2022   Hypothyroidism 03/02/2022   Acute non-recurrent frontal sinusitis 01/05/2022   Glands swollen 01/05/2022   Hypertension associated with diabetes (HCC) 01/05/2022  Acute rhinitis 01/05/2022   AV block, Mobitz 1    NSTEMI (non-ST elevated myocardial infarction) [facility])    ACS (acute coronary syndrome) (HCC) 12/16/2019   Diverticulitis of large intestine without perforation or abscess without bleeding    Pain of upper abdomen    SOB (shortness of breath)    S/P CABG (coronary artery bypass graft)    Back pain, chronic 07/28/2015   Benign fibroma of prostate 07/28/2015   Acid reflux 07/28/2015   Adult hypothyroidism 07/28/2015   Neuropathy 07/28/2015   Arthritis, degenerative 07/28/2015   Psoriasis 07/28/2015   Lumbar spondylosis 05/08/2015   Obesity 09/09/2014   Bilateral leg weakness  09/09/2014   Acute combined systolic and diastolic heart failure (HCC) 05/13/2014   Bilateral leg edema 04/15/2014   Bradycardia 08/13/2013   Edema 06/21/2012   Weakness 09/15/2011   Type 2 diabetes mellitus with diabetic peripheral angiopathy without gangrene, with long-term current use of insulin (HCC) 09/15/2011   DYSPNEA 06/09/2009   Hyperlipidemia 06/07/2009   Essential hypertension 06/07/2009   Coronary artery disease 06/07/2009    ONSET DATE: Jan 2024  REFERRING DIAG:  R26.81 (ICD-10-CM) - Gait instability  R26.89 (ICD-10-CM) - Decreased mobility    THERAPY DIAG:  Muscle weakness (generalized)  Difficulty in walking, not elsewhere classified  Unsteadiness on feet  Rationale for Evaluation and Treatment: Rehabilitation  SUBJECTIVE:                                                                                                                                                                                             SUBJECTIVE STATEMENT:  Pt reports no problems; no stumbles/falls.  Pt reports HEP is going well.  Pt accompanied by:  caregiver  PERTINENT HISTORY: From Eval: Patient reports having 2 CVA last year (1 CVA affecting right and 1 affecting left side). Prior to CVA's he reports he was walking normally without an AD. States has been walking with walker ever since.   [facility]- Patient is a 89 y/o veteran presenting with primary c/o difficulty in walking. PMH significant for CVA in Jan 2024 and in May 2024 with some residual R sided weakness.   PAIN:  Are you having pain? No  PRECAUTIONS: Fall  RED FLAGS: None   WEIGHT BEARING RESTRICTIONS: No  FALLS: Has patient fallen in last 6 months? Yes. Number of falls 1  LIVING ENVIRONMENT: Lives with: lives alone and has private  caregivers - everyday  and [facility] caregivers -3x/week - approx 8 hours/day Lives in: House/apartment Stairs:  has ramp supplied by [place] Has following equipment at home:  Single point cane,  Walker - 2 wheeled, Wheelchair (manual), Tour manager, bed side commode, Grab bars, Ramped entry, and Transport w/c  PLOF: Independent  PATIENT GOALS: I want to walk without walker independently  OBJECTIVE:  Note: Objective measures were completed at Evaluation unless otherwise noted.  DIAGNOSTIC FINDINGS: CLINICAL DATA:  Stroke, follow up   EXAM: CT HEAD WITHOUT CONTRAST   TECHNIQUE: Contiguous axial images were obtained from the base of the skull through the vertex without intravenous contrast.   RADIATION DOSE REDUCTION: This exam was performed according to the departmental dose-optimization program which includes automated exposure control, adjustment of the mA and/or kV according to patient size and/or use of iterative reconstruction technique.   COMPARISON:  MR Brain 04/06/23, CT head 04/06/23   FINDINGS: Brain: No hemorrhage. No hydrocephalus. There are prominent subarachnoid spaces bilaterally, unchanged. There is sequela of moderate chronic microvascular ischemic change with a chronic infarct in the left corona radiata and an acute infarct in the right basal ganglia, better characterized on recent brain MRI.   Vascular: No hyperdense vessel or unexpected calcification.   Skull: Normal. Negative for fracture or focal lesion.   Sinuses/Orbits: No mastoid or middle ear effusion. Paranasal sinuses are clear. Bilateral lens replacement. Orbits are otherwise unremarkable.   Other: None.   IMPRESSION: 1. No acute intracranial hemorrhage. 2. Acute infarct in the right basal ganglia, better characterized on recent brain MRI.     Electronically Signed   By: Lorenza Cambridge M.D.   On: 04/07/2023 16:08  COGNITION: Overall cognitive status: Within functional limits for tasks assessed   SENSATION: WFL  COORDINATION: WNL  EDEMA:  Some edema noted with lower left LE    POSTURE: rounded shoulders and forward head  LOWER EXTREMITY ROM:      Active  Right Eval Left Eval  Hip flexion    Hip extension    Hip abduction    Hip adduction    Hip internal rotation    Hip external rotation    Knee flexion    Knee extension    Ankle dorsiflexion    Ankle plantarflexion    Ankle inversion    Ankle eversion     (Blank rows = not tested)  LOWER EXTREMITY MMT:    MMT Right Eval Left Eval  Hip flexion 4 4  Hip extension    Hip abduction    Hip adduction    Hip internal rotation 4 4  Hip external rotation 4 4  Knee flexion 4 4  Knee extension 4 4  Ankle dorsiflexion 4 4  Ankle plantarflexion    Ankle inversion    Ankle eversion    (Blank rows = not tested)  BED MOBILITY:  NT but both patient and caregiver report no difficulty with bed mobility  TRANSFERS: Assistive device utilized: Environmental consultant - 2 wheeled  Sit to stand: CGA Stand to sit: CGA Chair to chair: CGA Floor:  not tested  GAIT: Gait pattern: decreased step length- Right and decreased step length- Left Distance walked: > 100 feet  Assistive device utilized: Walker - 2 wheeled Level of assistance: CGA Comments: anterior trunk lean  FUNCTIONAL TESTS:  5 times sit to stand: 22.32 sec without UE support Timed up and go (TUG): 27.94 sec  6 minute walk test: To be assessed visit # 2 10 meter walk test: 16.10 and 14.92 sec with RW=15.51 sec avg or 0.65 m/s using RW  Berg Balance Scale: To be assessed visit #2  PATIENT SURVEYS:  Stroke Impact Scale 52  ABC scale 37.5 %                                                                                                                              TREATMENT DATE: 02/22/24   Unless otherwise stated, CGA/min A was provided and gait belt donned for pt safety throughout session.  TA: Gait with RW and 3# aw x 148 ft and cuing for upright posture with 4WW - switched to 2WW 1x296 ft, 1x154 ft with RW (pt difficulty controlling 4ww) -some UE fatigue from increased UE weightbearing   STS 3x5 hands-free  Gait no  AD in // bars with 3# weights cuing for upright posture FWD/BCKWD x multiple reps length of bars  --pt then completes LTL stepping x multiple reps Comments: interventions fatiguing, cuing throughout for upright posture   PATIENT EDUCATION: Education details: exercise technique Person educated: Patient,  Education method: Explanation Education comprehension: verbalized understanding  HOME EXERCISE PROGRAM:  Access Code: [email or web address] URL: [email or web address] Date: 01/25/2024 Prepared by: Casimiro Needle  Exercises - Sit to Stand with Counter Support  - 1 x daily - 7 x weekly - 2 sets - 10 reps - Narrow Stance with Counter Support  - 1 x daily - 7 x weekly - 2 sets - 30 seconds hold - Narrow Stance with Head Nods and Counter Support  - 1 x daily - 7 x weekly - 2 sets - 10 reps   GOALS: Goals reviewed with patient? Yes  SHORT TERM GOALS: Target date: 02/29/2024  Pt will be independent with HEP in order to improve strength and balance in order to decrease fall risk and improve function at home and work.  Baseline: EVAL: no formal HEP in place Goal status: INITIAL   LONG TERM GOALS: Target date: 04/11/2024  1.  Patient (> 72 years old) will complete five times sit to stand test in < 15 seconds indicating an increased LE strength and improved balance. Baseline: EVAL: 22.32 sec without UE support Goal status: INITIAL  2.  Pt will improve ABC by at least 13% in order to demonstrate clinically significant improvement in balance confidence Baseline: EVAL: 37.5% Goal status: INITIAL   3.  Patient will increase Berg Balance score by > 6 points to demonstrate decreased fall risk during functional activities. Baseline: 01/25/2024: 25/56 Goal status: INITIAL   4.   Patient will reduce timed up and go to <11 seconds to reduce fall risk and demonstrate improved transfer/gait ability. Baseline: EVAL: 27.94 with RW Goal status: INITIAL  5.   Patient will increase 10 meter walk  test to >1.58m/s as to improve gait speed for better community ambulation and to reduce fall risk. Baseline: EVAL: 0.65 m/s Goal status: INITIAL  6.   Patient will increase six minute walk test distance to >1000 for progression to community ambulator and improve gait ability Baseline: EVAL: To be assessed  visit #2 Goal status: INITIAL  7.  Patient will improve score  on Stroke Impact Scale 16 by 8 points to signify a higher level of functioning and less difficulty with daily activities.  Baseline: EVAL: 52 Goal status: INITIAL   ASSESSMENT:  CLINICAL IMPRESSION:   Pt able to advance ambulation/endurance intervention with ambulating increased distance with resistance followed by challenging FWD/BCKWD and LTL stepping in // bars. Pt also able to consistently complete STS without UE support. Although pt making gains, he still fatigued quickly. The pt will benefit from skilled PT services to improve his overall functional strength and walking to enhance his independence and decrease his risk of falling.   OBJECTIVE IMPAIRMENTS: Abnormal gait, decreased activity tolerance, decreased balance, decreased endurance, decreased mobility, and decreased strength.   ACTIVITY LIMITATIONS: carrying, lifting, bending, standing, squatting, and stairs  PARTICIPATION LIMITATIONS: cleaning, laundry, driving, shopping, community activity, and yard work  PERSONAL FACTORS: Age and 3+ comorbidities: HTN, CHF, A-FIB, DM  are also affecting patient's functional outcome.   REHAB POTENTIAL: Good  CLINICAL DECISION MAKING: Evolving/moderate complexity  EVALUATION COMPLEXITY: Moderate  PLAN:  PT FREQUENCY: 1-2x/week  PT DURATION: 12 weeks  PLANNED INTERVENTIONS: 97164- PT Re-evaluation, 97110-Therapeutic exercises, 97530- Therapeutic activity, 97112- Neuromuscular re-education, 97535- Self Care, 16109- Manual therapy, [phone]- Gait training, [phone]- Canalith repositioning, [phone]- Electrical stimulation (manual),  Patient/Family education, Balance training, Stair training, Dry Needling, Joint mobilization, Vestibular training, DME instructions, Cryotherapy, and Moist heat  PLAN FOR NEXT SESSION:  - 6 min walk test  - review new HEP - balance interventions including: narrow BOS, reaching/turning/rotating, eyes closed, and alternating foot taps - gait training without AD - implement therex/theract/gait training Continue plan  Temple Pacini PT, DPT  Physical Therapist - [facility]  [facility]  5:14 PM 02/22/24

## 2024-02-26 ENCOUNTER — Ambulatory Visit: Payer: PPO

## 2024-02-27 ENCOUNTER — Ambulatory Visit: Admitting: Physical Therapy

## 2024-02-27 DIAGNOSIS — M6281 Muscle weakness (generalized): Secondary | ICD-10-CM | POA: Diagnosis not present

## 2024-02-27 DIAGNOSIS — I69398 Other sequelae of cerebral infarction: Secondary | ICD-10-CM

## 2024-02-27 DIAGNOSIS — R2681 Unsteadiness on feet: Secondary | ICD-10-CM

## 2024-02-27 DIAGNOSIS — R278 Other lack of coordination: Secondary | ICD-10-CM

## 2024-02-27 DIAGNOSIS — R269 Unspecified abnormalities of gait and mobility: Secondary | ICD-10-CM

## 2024-02-27 DIAGNOSIS — R262 Difficulty in walking, not elsewhere classified: Secondary | ICD-10-CM

## 2024-02-27 NOTE — Therapy (Signed)
 OUTPATIENT PHYSICAL THERAPY NEURO TREATMENT/ Physical Therapy Progress Note   Dates of reporting period  01/18/24   to   02/27/24    Patient Name: Joseph [name] MRN: [id number] DOB:[birth year], 89 y.o., male Today's Date: 02/27/2024   PCP: Dr. Julieanne Manson REFERRING PROVIDER: Toya Smothers, PA  END OF SESSION:    PT End of Session - 02/27/24 1008     Visit Number 10    Number of Visits 24    Date for PT Re-Evaluation 04/11/24    Progress Note Due on Visit 10    PT Start Time 1012    PT Stop Time 1052    PT Time Calculation (min) 40 min    Equipment Utilized During Treatment Gait belt    Activity Tolerance Patient tolerated treatment well    Behavior During Therapy WFL for tasks assessed/performed                     Past Medical History:  Diagnosis Date   Acute respiratory failure with hypoxia (HCC) 03/21/2016   Atrial fibrillation (HCC) 03/22/2023   CAD (coronary artery disease)    a. s/p CABG;  b. 07/2012 Cath: 3VD including 80 dLCX (med Rx), 4/4 patent grafts.   Chronic diastolic CHF (congestive heart failure) (HCC)    a. 07/2012 Echo: EF 55-60%, no rwma, Gr 1 DD, mild MR;  04/2014 Echo: EF 55-60%, no rwma, mild MR, mildly dil LA.   Diabetes mellitus, type 2 (HCC)    HTN (hypertension)    Hyperlipidemia    Hypothyroidism    Lower extremity edema    a. 03/2014 amlodipine d/c'd.   Spinal stenosis    Stroke (HCC)    1/24 and 5/24   Past Surgical History:  Procedure Laterality Date   APPENDECTOMY     CARDIAC CATHETERIZATION  07/2012   [facility]/no stents   CHOLECYSTECTOMY     CORNEAL TRANSPLANT     CORONARY ARTERY BYPASS GRAFT  1994   HEMORRHOID SURGERY     LEFT HEART CATH AND CORS/GRAFTS ANGIOGRAPHY N/A 12/17/2019   Procedure: LEFT HEART CATH AND CORS/GRAFTS ANGIOGRAPHY;  Surgeon: Yvonne Kendall, MD;  Location: [facility] INVASIVE CV LAB;  Service: Cardiovascular;  Laterality: N/A;   PROSTATE BIOPSY     TOE SURGERY     ingrown toe nail   Patient Active  Problem List   Diagnosis Date Noted   Chronic anticoagulation 04/23/2023   Stroke (HCC) 04/06/2023   Type II diabetes mellitus with renal manifestations (HCC) 04/06/2023   Chronic kidney disease, stage 3a (HCC) 04/06/2023   COPD (chronic obstructive pulmonary disease) (HCC) 04/06/2023   Chronic diastolic CHF (congestive heart failure) (HCC) 04/06/2023   Permanent atrial fibrillation (HCC) 04/06/2023   Obesity (BMI 30-39.9) 04/06/2023   Acute cerebrovascular accident (CVA) (HCC) 12/09/2022   Chronic combined systolic and diastolic CHF (congestive heart failure) (HCC) 12/09/2022   Fall at home, initial encounter 12/09/2022   Hypokalemia 12/09/2022   Hemiparesis affecting right side as late effect of cerebrovascular accident (CVA) (HCC) 12/09/2022   Skin tear of left hand without complication 11/16/2022   Pain of left hip 11/16/2022   Left hip pain 11/10/2022   Intertrigo 11/10/2022   COPD with acute exacerbation (HCC) 10/06/2022   Diabetes mellitus (HCC) 06/10/2022   Enlarged prostate 03/02/2022   Other specified counseling 03/02/2022   Renal calculi 03/02/2022   Gastroesophageal reflux disease 03/02/2022   Hypertensive disorder 03/02/2022   Mixed hyperlipidemia 03/02/2022   Hypothyroidism 03/02/2022  Acute non-recurrent frontal sinusitis 01/05/2022   Glands swollen 01/05/2022   Hypertension associated with diabetes (HCC) 01/05/2022   Acute rhinitis 01/05/2022   AV block, Mobitz 1    NSTEMI (non-ST elevated myocardial infarction) [facility])    ACS (acute coronary syndrome) (HCC) 12/16/2019   Diverticulitis of large intestine without perforation or abscess without bleeding    Pain of upper abdomen    SOB (shortness of breath)    S/P CABG (coronary artery bypass graft)    Back pain, chronic 07/28/2015   Benign fibroma of prostate 07/28/2015   Acid reflux 07/28/2015   Adult hypothyroidism 07/28/2015   Neuropathy 07/28/2015   Arthritis, degenerative 07/28/2015   Psoriasis  07/28/2015   Lumbar spondylosis 05/08/2015   Obesity 09/09/2014   Bilateral leg weakness 09/09/2014   Acute combined systolic and diastolic heart failure (HCC) 05/13/2014   Bilateral leg edema 04/15/2014   Bradycardia 08/13/2013   Edema 06/21/2012   Weakness 09/15/2011   Type 2 diabetes mellitus with diabetic peripheral angiopathy without gangrene, with long-term current use of insulin (HCC) 09/15/2011   DYSPNEA 06/09/2009   Hyperlipidemia 06/07/2009   Essential hypertension 06/07/2009   Coronary artery disease 06/07/2009    ONSET DATE: Jan 2024  REFERRING DIAG:  R26.81 (ICD-10-CM) - Gait instability  R26.89 (ICD-10-CM) - Decreased mobility    THERAPY DIAG:  Muscle weakness (generalized)  Difficulty in walking, not elsewhere classified  Unsteadiness on feet  Abnormality of gait  Difficulty walking  Imbalance due to old stroke  Other lack of coordination  Rationale for Evaluation and Treatment: Rehabilitation  SUBJECTIVE:                                                                                                                                                                                             SUBJECTIVE STATEMENT:  Pt reports no problems; no stumbles/falls.  Pt reports HEP is going well.  Pt accompanied by:  caregiver  PERTINENT HISTORY: From Eval: Patient reports having 2 CVA last year (1 CVA affecting right and 1 affecting left side). Prior to CVA's he reports he was walking normally without an AD. States has been walking with walker ever since.   [facility]- Patient is a 89 y/o veteran presenting with primary c/o difficulty in walking. PMH significant for CVA in Jan 2024 and in May 2024 with some residual R sided weakness.   PAIN:  Are you having pain? No  PRECAUTIONS: Fall  RED FLAGS: None   WEIGHT BEARING RESTRICTIONS: No  FALLS: Has patient fallen in last 6 months? Yes. Number of falls 1  LIVING ENVIRONMENT: Lives  with: lives alone and has private  caregivers - everyday  and [facility] caregivers -3x/week - approx 8 hours/day Lives in: House/apartment Stairs:  has ramp supplied by [place] Has following equipment at home: Single point cane, Environmental consultant - 2 wheeled, Wheelchair (manual), Shower bench, bed side commode, Grab bars, Ramped entry, and Transport w/c  PLOF: Independent  PATIENT GOALS: I want to walk without walker independently  OBJECTIVE:  Note: Objective measures were completed at Evaluation unless otherwise noted.  DIAGNOSTIC FINDINGS: CLINICAL DATA:  Stroke, follow up   EXAM: CT HEAD WITHOUT CONTRAST   TECHNIQUE: Contiguous axial images were obtained from the base of the skull through the vertex without intravenous contrast.   RADIATION DOSE REDUCTION: This exam was performed according to the departmental dose-optimization program which includes automated exposure control, adjustment of the mA and/or kV according to patient size and/or use of iterative reconstruction technique.   COMPARISON:  MR Brain 04/06/23, CT head 04/06/23   FINDINGS: Brain: No hemorrhage. No hydrocephalus. There are prominent subarachnoid spaces bilaterally, unchanged. There is sequela of moderate chronic microvascular ischemic change with a chronic infarct in the left corona radiata and an acute infarct in the right basal ganglia, better characterized on recent brain MRI.   Vascular: No hyperdense vessel or unexpected calcification.   Skull: Normal. Negative for fracture or focal lesion.   Sinuses/Orbits: No mastoid or middle ear effusion. Paranasal sinuses are clear. Bilateral lens replacement. Orbits are otherwise unremarkable.   Other: None.   IMPRESSION: 1. No acute intracranial hemorrhage. 2. Acute infarct in the right basal ganglia, better characterized on recent brain MRI.     Electronically Signed   By: Lorenza Cambridge M.D.   On: 04/07/2023 16:08  COGNITION: Overall cognitive status: Within  functional limits for tasks assessed   SENSATION: WFL  COORDINATION: WNL  EDEMA:  Some edema noted with lower left LE    POSTURE: rounded shoulders and forward head  LOWER EXTREMITY ROM:     Active  Right Eval Left Eval  Hip flexion    Hip extension    Hip abduction    Hip adduction    Hip internal rotation    Hip external rotation    Knee flexion    Knee extension    Ankle dorsiflexion    Ankle plantarflexion    Ankle inversion    Ankle eversion     (Blank rows = not tested)  LOWER EXTREMITY MMT:    MMT Right Eval Left Eval  Hip flexion 4 4  Hip extension    Hip abduction    Hip adduction    Hip internal rotation 4 4  Hip external rotation 4 4  Knee flexion 4 4  Knee extension 4 4  Ankle dorsiflexion 4 4  Ankle plantarflexion    Ankle inversion    Ankle eversion    (Blank rows = not tested)  BED MOBILITY:  NT but both patient and caregiver report no difficulty with bed mobility  TRANSFERS: Assistive device utilized: Environmental consultant - 2 wheeled  Sit to stand: CGA Stand to sit: CGA Chair to chair: CGA Floor:  not tested  GAIT: Gait pattern: decreased step length- Right and decreased step length- Left Distance walked: > 100 feet  Assistive device utilized: Walker - 2 wheeled Level of assistance: CGA Comments: anterior trunk lean  FUNCTIONAL TESTS:  5 times sit to stand: 22.32 sec without UE support Timed up and go (TUG): 27.94 sec  6 minute walk test: To be assessed  visit # 2 10 meter walk test: 16.10 and 14.92 sec with RW=15.51 sec avg or 0.65 m/s using RW  Berg Balance Scale: To be assessed visit #2  PATIENT SURVEYS:  Stroke Impact Scale 52 ABC scale 37.5 %                                                                                                                              TREATMENT DATE: 02/27/24   Physical therapy treatment session today consisted of completing assessment of goals and administration of testing as demonstrated and  documented in flow sheet, treatment, and goals section of this note. Addition treatments may be found below.   PHYSICAL PERFORMANCE  Five times Sit to Stand Test (FTSS)  TIME: 22.81 sec  Cut off scores indicative of increased fall risk: >12 sec CVA, >16 sec PD, >13 sec vestibular (ANPTA Core Set of Outcome Measures for Adults with Neurologic Conditions, 2018)  Patient demonstrates increased fall risk as noted by score of  31 /56 on Berg Balance Scale.  (<36= high risk for falls, close to 100%; 37-45 significant >80%; 46-51 moderate >50%; 52-55 lower >25%)   [facility] PT Assessment - 02/27/24 0001       Berg Balance Test   Sit to Stand Able to stand without using hands and stabilize independently    Standing Unsupported Able to stand 2 minutes with supervision    Sitting with Back Unsupported but Feet Supported on Floor or Stool Able to sit safely and securely 2 minutes    Stand to Sit Controls descent by using hands    Transfers Able to transfer with verbal cueing and /or supervision    Standing Unsupported with Eyes Closed Able to stand 10 seconds with supervision    Standing Unsupported with Feet Together Able to place feet together independently and stand for 1 minute with supervision    From Standing, Reach Forward with Outstretched Arm Can reach forward >5 cm safely (2")    From Standing Position, Pick up Object from Floor Able to pick up shoe, needs supervision    From Standing Position, Turn to Look Behind Over each Shoulder Turn sideways only but maintains balance    Turn 360 Degrees Needs close supervision or verbal cueing    Standing Unsupported, Alternately Place Feet on Step/Stool Needs assistance to keep from falling or unable to try    Standing Unsupported, One Foot in Front Needs help to step but can hold 15 seconds    Standing on One Leg Unable to try or needs assist to prevent fall    Total Score 31            PT instructed pt in TUG: 35.58 sec ( >13.5 sec indicates  increased fall risk)'  6 Min Walk Test:  Instructed patient to ambulate as quickly and as safely as possible for 6 minutes using LRAD. Patient was allowed to take standing rest breaks without stopping the test,  but if the patient required a sitting rest break the clock would be stopped and the test would be over.  Results: 600 feet using a RW with SBA. Results indicate that the patient has reduced endurance with ambulation compared to age matched norms.  Age Matched Norms (in meters): 23-69 yo M: 44 F: 33, 25-79 yo M: 41 F: 471, 34-89 yo M: 417 F: 392 MDC: 58.21 meters (190.98 feet) or 50 meters (ANPTA Core Set of Outcome Measures for Adults with Neurologic Conditions, 2018)  Self Care:  Pt instructed in effects of CVA and why this may cause his deficits. Provided examples to better explain why sometimes process and progress can be frustrating but worthwhile. Explained results of testing as well and what this means for his balance.   PATIENT EDUCATION: Education details: exercise technique Person educated: Patient,  Education method: Explanation Education comprehension: verbalized understanding  HOME EXERCISE PROGRAM:  Access Code: [email or web address] URL: [email or web address] Date: 01/25/2024 Prepared by: Casimiro Needle  Exercises - Sit to Stand with Counter Support  - 1 x daily - 7 x weekly - 2 sets - 10 reps - Narrow Stance with Counter Support  - 1 x daily - 7 x weekly - 2 sets - 30 seconds hold - Narrow Stance with Head Nods and Counter Support  - 1 x daily - 7 x weekly - 2 sets - 10 reps   GOALS: Goals reviewed with patient? Yes  SHORT TERM GOALS: Target date: 02/29/2024  Pt will be independent with HEP in order to improve strength and balance in order to decrease fall risk and improve function at home and work.  Baseline: EVAL: no formal HEP in place 4/8: Completing 5 days per week Goal status: MET   LONG TERM GOALS: Target date: 04/11/2024  1.  Patient (> 30  years old) will complete five times sit to stand test in < 15 seconds indicating an increased LE strength and improved balance. Baseline: EVAL: 22.32 sec without UE support 02/27/24: 22.81 sec no UE support Goal status: INITIAL  2.  Pt will improve ABC by at least 13% in order to demonstrate clinically significant improvement in balance confidence Baseline: EVAL: 37.5%  Goal status: INITIAL   3.  Patient will increase Berg Balance score by > 6 points to demonstrate decreased fall risk during functional activities. Baseline: 01/25/2024: 25/56 02/27/24: 31 Goal status: INITIAL   4.   Patient will reduce timed up and go to <11 seconds to reduce fall risk and demonstrate improved transfer/gait ability. Baseline: EVAL: 27.94 with RW 4/8: 35.58 sec with RW Goal status: INITIAL  5.   Patient will increase 10 meter walk test to >1.77m/s as to improve gait speed for better community ambulation and to reduce fall risk. Baseline: EVAL: 0.65 m/s 02/27/24: .43 m/s with RW Goal status: INITIAL  6.   Patient will increase six minute walk test distance to >1000 for progression to community ambulator and improve gait ability Baseline: EVAL: To be assessed  visit #2 4/8: 600 ft  Goal status: INITIAL  7.  Patient will improve score on Stroke Impact Scale 16 by 8 points to signify a higher level of functioning and less difficulty with daily activities.  Baseline: EVAL: 52 02/27/24:55 Goal status: INITIAL   ASSESSMENT:  CLINICAL IMPRESSION:    Pt presents for progress note this date. Pt shows some progress towards his goal of improving BERG balance scale but his score still places him at high risk of falls as well as  indicates when he is walking he should be using as AD to reduce his risk of falling. Pt made minimal progress or lost ground with 5XSTS, TUG, and but the declines where minimal and may have been due to fatigue throughout session. Pt and caregiver both reports good compliance with HEP, meeting  his goal for independence with this. Patient's condition has the potential to improve in response to therapy. Maximum improvement is yet to be obtained. The anticipated improvement is attainable and reasonable in a generally predictable time.  Pt will continue to benefit from skilled physical therapy intervention to address impairments, improve QOL, and attain therapy goals.    OBJECTIVE IMPAIRMENTS: Abnormal gait, decreased activity tolerance, decreased balance, decreased endurance, decreased mobility, and decreased strength.   ACTIVITY LIMITATIONS: carrying, lifting, bending, standing, squatting, and stairs  PARTICIPATION LIMITATIONS: cleaning, laundry, driving, shopping, community activity, and yard work  PERSONAL FACTORS: Age and 3+ comorbidities: HTN, CHF, A-FIB, DM  are also affecting patient's functional outcome.   REHAB POTENTIAL: Good  CLINICAL DECISION MAKING: Evolving/moderate complexity  EVALUATION COMPLEXITY: Moderate  PLAN:  PT FREQUENCY: 1-2x/week  PT DURATION: 12 weeks  PLANNED INTERVENTIONS: 97164- PT Re-evaluation, 97110-Therapeutic exercises, 97530- Therapeutic activity, 97112- Neuromuscular re-education, 97535- Self Care, 40347- Manual therapy, [phone]- Gait training, [phone]- Canalith repositioning, [phone]- Electrical stimulation (manual), Patient/Family education, Balance training, Stair training, Dry Needling, Joint mobilization, Vestibular training, DME instructions, Cryotherapy, and Moist heat  PLAN FOR NEXT SESSION:  - review new HEP - balance interventions including: narrow BOS, reaching/turning/rotating, eyes closed, and alternating foot taps - gait training without AD - implement therex/theract/gait training Continue plan   Norman Herrlich PT ,DPT Physical Therapist- [facility]  [facility]  10:08 AM 02/27/24

## 2024-02-28 ENCOUNTER — Ambulatory Visit: Payer: PPO

## 2024-02-29 ENCOUNTER — Ambulatory Visit

## 2024-03-04 ENCOUNTER — Ambulatory Visit

## 2024-03-04 ENCOUNTER — Ambulatory Visit: Payer: PPO

## 2024-03-04 DIAGNOSIS — R2681 Unsteadiness on feet: Secondary | ICD-10-CM

## 2024-03-04 DIAGNOSIS — M6281 Muscle weakness (generalized): Secondary | ICD-10-CM

## 2024-03-04 DIAGNOSIS — R262 Difficulty in walking, not elsewhere classified: Secondary | ICD-10-CM

## 2024-03-04 DIAGNOSIS — R269 Unspecified abnormalities of gait and mobility: Secondary | ICD-10-CM

## 2024-03-04 NOTE — Therapy (Signed)
 OUTPATIENT PHYSICAL THERAPY NEURO TREATMENT   Patient Name: Joseph [name] MRN: [id number] DOB:Apr 22, 1925, 89 y.o., male Today's Date: 03/04/2024   PCP: Dr. Julieanne Manson REFERRING PROVIDER: Toya Smothers, PA  END OF SESSION:    PT End of Session - 03/04/24 1106     Visit Number 11    Number of Visits 24    Date for PT Re-Evaluation 04/11/24    Progress Note Due on Visit 10    PT Start Time 1103    PT Stop Time 1145    PT Time Calculation (min) 42 min    Equipment Utilized During Treatment Gait belt    Activity Tolerance Patient tolerated treatment well    Behavior During Therapy WFL for tasks assessed/performed               Past Medical History:  Diagnosis Date   Acute respiratory failure with hypoxia (HCC) 03/21/2016   Atrial fibrillation (HCC) 03/22/2023   CAD (coronary artery disease)    a. s/p CABG;  b. 07/2012 Cath: 3VD including 80 dLCX (med Rx), 4/4 patent grafts.   Chronic diastolic CHF (congestive heart failure) (HCC)    a. 07/2012 Echo: EF 55-60%, no rwma, Gr 1 DD, mild MR;  04/2014 Echo: EF 55-60%, no rwma, mild MR, mildly dil LA.   Diabetes mellitus, type 2 (HCC)    HTN (hypertension)    Hyperlipidemia    Hypothyroidism    Lower extremity edema    a. 03/2014 amlodipine d/c'd.   Spinal stenosis    Stroke (HCC)    1/24 and 5/24   Past Surgical History:  Procedure Laterality Date   APPENDECTOMY     CARDIAC CATHETERIZATION  07/2012   [facility]/no stents   CHOLECYSTECTOMY     CORNEAL TRANSPLANT     CORONARY ARTERY BYPASS GRAFT  1994   HEMORRHOID SURGERY     LEFT HEART CATH AND CORS/GRAFTS ANGIOGRAPHY N/A 12/17/2019   Procedure: LEFT HEART CATH AND CORS/GRAFTS ANGIOGRAPHY;  Surgeon: Yvonne Kendall, MD;  Location: [facility] INVASIVE CV [facility];  Service: Cardiovascular;  Laterality: N/A;   PROSTATE BIOPSY     TOE SURGERY     ingrown toe nail   Patient Active Problem List   Diagnosis Date Noted   Chronic anticoagulation 04/23/2023   Stroke (HCC) 04/06/2023    Type II diabetes mellitus with renal manifestations (HCC) 04/06/2023   Chronic kidney disease, stage 3a (HCC) 04/06/2023   COPD (chronic obstructive pulmonary disease) (HCC) 04/06/2023   Chronic diastolic CHF (congestive heart failure) (HCC) 04/06/2023   Permanent atrial fibrillation (HCC) 04/06/2023   Obesity (BMI 30-39.9) 04/06/2023   Acute cerebrovascular accident (CVA) (HCC) 12/09/2022   Chronic combined systolic and diastolic CHF (congestive heart failure) (HCC) 12/09/2022   Fall at home, initial encounter 12/09/2022   Hypokalemia 12/09/2022   Hemiparesis affecting right side as late effect of cerebrovascular accident (CVA) (HCC) 12/09/2022   Skin tear of left hand without complication 11/16/2022   Pain of left hip 11/16/2022   Left hip pain 11/10/2022   Intertrigo 11/10/2022   COPD with acute exacerbation (HCC) 10/06/2022   Diabetes mellitus (HCC) 06/10/2022   Enlarged prostate 03/02/2022   Other specified counseling 03/02/2022   Renal calculi 03/02/2022   Gastroesophageal reflux disease 03/02/2022   Hypertensive disorder 03/02/2022   Mixed hyperlipidemia 03/02/2022   Hypothyroidism 03/02/2022   Acute non-recurrent frontal sinusitis 01/05/2022   Glands swollen 01/05/2022   Hypertension associated with diabetes (HCC) 01/05/2022   Acute rhinitis 01/05/2022  AV block, Mobitz 1    NSTEMI (non-ST elevated myocardial infarction) (HCC)    ACS (acute coronary syndrome) (HCC) 12/16/2019   Diverticulitis of large intestine without perforation or abscess without bleeding    Pain of upper abdomen    SOB (shortness of breath)    S/P CABG (coronary artery bypass graft)    Back pain, chronic 07/28/2015   Benign fibroma of prostate 07/28/2015   Acid reflux 07/28/2015   Adult hypothyroidism 07/28/2015   Neuropathy 07/28/2015   Arthritis, degenerative 07/28/2015   Psoriasis 07/28/2015   Lumbar spondylosis 05/08/2015   Obesity 09/09/2014   Bilateral leg weakness 09/09/2014   Acute  combined systolic and diastolic heart failure (HCC) 05/13/2014   Bilateral leg edema 04/15/2014   Bradycardia 08/13/2013   Edema 06/21/2012   Weakness 09/15/2011   Type 2 diabetes mellitus with diabetic peripheral angiopathy without gangrene, with long-term current use of insulin (HCC) 09/15/2011   DYSPNEA 06/09/2009   Hyperlipidemia 06/07/2009   Essential hypertension 06/07/2009   Coronary artery disease 06/07/2009    ONSET DATE: Jan 2024  REFERRING DIAG:  R26.81 (ICD-10-CM) - Gait instability  R26.89 (ICD-10-CM) - Decreased mobility    THERAPY DIAG:  Muscle weakness (generalized)  Difficulty in walking, not elsewhere classified  Abnormality of gait  Unsteadiness on feet  Rationale for Evaluation and Treatment: Rehabilitation  SUBJECTIVE:                                                                                                                                                                                             SUBJECTIVE STATEMENT:  Pt reports no problems; no stumbles/falls.  Pt reports HEP is going well.  Pt accompanied by:  caregiver  PERTINENT HISTORY: From Eval: Patient reports having 2 CVA last year (1 CVA affecting right and 1 affecting left side). Prior to CVA's he reports he was walking normally without an AD. States has been walking with Kuper Rennels ever since.   [facility]24- Patient is a 89 y/o veteran presenting with primary c/o difficulty in walking. PMH significant for CVA in Jan 2024 and in May 2024 with some residual R sided weakness.   PAIN:  Are you having pain? No  PRECAUTIONS: Fall  RED FLAGS: None   WEIGHT BEARING RESTRICTIONS: No  FALLS: Has patient fallen in last 6 months? Yes. Number of falls 1  LIVING ENVIRONMENT: Lives with: lives alone and has private  caregivers - everyday  and [facility] caregivers -3x/week - approx 8 hours/day Lives in: House/apartment Stairs:  has ramp supplied by [place] Has following equipment at  home: Single point cane, Darold Miley -  2 wheeled, Wheelchair (manual), Shower bench, bed side commode, Grab bars, Ramped entry, and Transport w/c  PLOF: Independent  PATIENT GOALS: I want to walk without Aeva Posey independently  OBJECTIVE:  Note: Objective measures were completed at Evaluation unless otherwise noted.  DIAGNOSTIC FINDINGS: CLINICAL DATA:  Stroke, follow up   EXAM: CT HEAD WITHOUT CONTRAST   TECHNIQUE: Contiguous axial images were obtained from the base of the skull through the vertex without intravenous contrast.   RADIATION DOSE REDUCTION: This exam was performed according to the departmental dose-optimization program which includes automated exposure control, adjustment of the mA and/or kV according to patient size and/or use of iterative reconstruction technique.   COMPARISON:  MR Brain 04/06/23, CT head 04/06/23   FINDINGS: Brain: No hemorrhage. No hydrocephalus. There are prominent subarachnoid spaces bilaterally, unchanged. There is sequela of moderate chronic microvascular ischemic change with a chronic infarct in the left corona radiata and an acute infarct in the right basal ganglia, better characterized on recent brain MRI.   Vascular: No hyperdense vessel or unexpected calcification.   Skull: Normal. Negative for fracture or focal lesion.   Sinuses/Orbits: No mastoid or middle ear effusion. Paranasal sinuses are clear. Bilateral lens replacement. Orbits are otherwise unremarkable.   Other: None.   IMPRESSION: 1. No acute intracranial hemorrhage. 2. Acute infarct in the right basal ganglia, better characterized on recent brain MRI.     Electronically Signed   By: Clora Dane M.D.   On: 04/07/2023 16:08  COGNITION: Overall cognitive status: Within functional limits for tasks assessed   SENSATION: WFL  COORDINATION: WNL  EDEMA:  Some edema noted with lower left LE    POSTURE: rounded shoulders and forward head  LOWER EXTREMITY ROM:      Active  Right Eval Left Eval  Hip flexion    Hip extension    Hip abduction    Hip adduction    Hip internal rotation    Hip external rotation    Knee flexion    Knee extension    Ankle dorsiflexion    Ankle plantarflexion    Ankle inversion    Ankle eversion     (Blank rows = not tested)  LOWER EXTREMITY MMT:    MMT Right Eval Left Eval  Hip flexion 4 4  Hip extension    Hip abduction    Hip adduction    Hip internal rotation 4 4  Hip external rotation 4 4  Knee flexion 4 4  Knee extension 4 4  Ankle dorsiflexion 4 4  Ankle plantarflexion    Ankle inversion    Ankle eversion    (Blank rows = not tested)  BED MOBILITY:  NT but both patient and caregiver report no difficulty with bed mobility  TRANSFERS: Assistive device utilized: Environmental consultant - 2 wheeled  Sit to stand: CGA Stand to sit: CGA Chair to chair: CGA Floor:  not tested  GAIT: Gait pattern: decreased step length- Right and decreased step length- Left Distance walked: > 100 feet  Assistive device utilized: Brenten Janney - 2 wheeled Level of assistance: CGA Comments: anterior trunk lean  FUNCTIONAL TESTS:  5 times sit to stand: 22.32 sec without UE support Timed up and go (TUG): 27.94 sec  6 minute walk test: To be assessed visit # 2 10 meter walk test: 16.10 and 14.92 sec with RW=15.51 sec avg or 0.65 m/s using RW  Berg Balance Scale: To be assessed visit #2  PATIENT SURVEYS:  Stroke Impact Scale 52 ABC scale  37.5 %                                                                                                                              TREATMENT DATE: 03/04/24   Gait with 3# AW x 300' with RW - BUE fatiguing - cues for upright posture  STS 2 x 10 with no UE support  Standing 4" step taps 3# AW with 1 UE support 2 x 10 each LE  Lateral step with 3# AW over SPC with single UE support 2 x 10 each direction  Seated LAQ 3 # AW x 10 with 3 second hold   PATIENT EDUCATION: Education details:  exercise technique Person educated: Patient,  Education method: Explanation Education comprehension: verbalized understanding  HOME EXERCISE PROGRAM:  Access Code: [email or web address] URL: [email or web address] Date: 01/25/2024 Prepared by: Carlen Chasten  Exercises - Sit to Stand with Counter Support  - 1 x daily - 7 x weekly - 2 sets - 10 reps - Narrow Stance with Counter Support  - 1 x daily - 7 x weekly - 2 sets - 30 seconds hold - Narrow Stance with Head Nods and Counter Support  - 1 x daily - 7 x weekly - 2 sets - 10 reps   GOALS: Goals reviewed with patient? Yes  SHORT TERM GOALS: Target date: 02/29/2024  Pt will be independent with HEP in order to improve strength and balance in order to decrease fall risk and improve function at home and work.  Baseline: EVAL: no formal HEP in place 4/8: Completing 5 days per week Goal status: MET   LONG TERM GOALS: Target date: 04/11/2024  1.  Patient (> 74 years old) will complete five times sit to stand test in < 15 seconds indicating an increased LE strength and improved balance. Baseline: EVAL: 22.32 sec without UE support 02/27/24: 22.81 sec no UE support Goal status: INITIAL  2.  Pt will improve ABC by at least 13% in order to demonstrate clinically significant improvement in balance confidence Baseline: EVAL: 37.5%  Goal status: INITIAL   3.  Patient will increase Berg Balance score by > 6 points to demonstrate decreased fall risk during functional activities. Baseline: 01/25/2024: 25/56 02/27/24: 31 Goal status: INITIAL   4.   Patient will reduce timed up and go to <11 seconds to reduce fall risk and demonstrate improved transfer/gait ability. Baseline: EVAL: 27.94 with RW 4/8: 35.58 sec with RW Goal status: INITIAL  5.   Patient will increase 10 meter walk test to >1.65m/s as to improve gait speed for better community ambulation and to reduce fall risk. Baseline: EVAL: 0.65 m/s 02/27/24: .43 m/s with RW Goal status:  INITIAL  6.   Patient will increase six minute walk test distance to >1000 for progression to community ambulator and improve gait ability Baseline: EVAL: To be assessed  visit #2 4/8: 600 ft  Goal status: INITIAL  7.  Patient will improve score on  Stroke Impact Scale 16 by 8 points to signify a higher level of functioning and less difficulty with daily activities.  Baseline: EVAL: 52 02/27/24:55 Goal status: INITIAL   ASSESSMENT:  CLINICAL IMPRESSION:     Patient arrives to treatment session motivated to participate. Session focused on resistance gait training and dynamic balance activities. Continues to be limited by fatigue. Patient wanting to work more towards ambulation with no AD. Pt will continue to benefit from skilled physical therapy intervention to address impairments, improve QOL, and attain therapy goals.    OBJECTIVE IMPAIRMENTS: Abnormal gait, decreased activity tolerance, decreased balance, decreased endurance, decreased mobility, and decreased strength.   ACTIVITY LIMITATIONS: carrying, lifting, bending, standing, squatting, and stairs  PARTICIPATION LIMITATIONS: cleaning, laundry, driving, shopping, community activity, and yard work  PERSONAL FACTORS: Age and 3+ comorbidities: HTN, CHF, A-FIB, DM  are also affecting patient's functional outcome.   REHAB POTENTIAL: Good  CLINICAL DECISION MAKING: Evolving/moderate complexity  EVALUATION COMPLEXITY: Moderate  PLAN:  PT FREQUENCY: 1-2x/week  PT DURATION: 12 weeks  PLANNED INTERVENTIONS: [id number]- PT Re-evaluation, 97110-Therapeutic exercises, 97530- Therapeutic activity, 97112- Neuromuscular re-education, 97535- Self Care, 91478- Manual therapy, [phone]- Gait training, [phone]- Canalith repositioning, [phone]- Electrical stimulation (manual), Patient/Family education, Balance training, Stair training, Dry Needling, Joint mobilization, Vestibular training, DME instructions, Cryotherapy, and Moist heat  PLAN FOR NEXT  SESSION:  - review new HEP - balance interventions including: narrow BOS, reaching/turning/rotating, eyes closed, and alternating foot taps - gait training without AD - implement therex/theract/gait training Continue plan -need to recert at 15 for requesting more visits    Janine Melbourne, PT, DPT  Physical Therapist- [facility]  11:06 AM 03/04/24

## 2024-03-06 ENCOUNTER — Ambulatory Visit: Payer: PPO

## 2024-03-07 ENCOUNTER — Ambulatory Visit: Admitting: Physical Therapy

## 2024-03-07 DIAGNOSIS — I69398 Other sequelae of cerebral infarction: Secondary | ICD-10-CM

## 2024-03-07 DIAGNOSIS — R269 Unspecified abnormalities of gait and mobility: Secondary | ICD-10-CM

## 2024-03-07 DIAGNOSIS — M6281 Muscle weakness (generalized): Secondary | ICD-10-CM | POA: Diagnosis not present

## 2024-03-07 DIAGNOSIS — R2681 Unsteadiness on feet: Secondary | ICD-10-CM

## 2024-03-07 DIAGNOSIS — R262 Difficulty in walking, not elsewhere classified: Secondary | ICD-10-CM

## 2024-03-07 DIAGNOSIS — R278 Other lack of coordination: Secondary | ICD-10-CM

## 2024-03-07 NOTE — Therapy (Signed)
 OUTPATIENT PHYSICAL THERAPY NEURO TREATMENT   Patient Name: Joseph [name] MRN: [id number] DOB:08-21-1925, 89 y.o., male Today's Date: 03/07/2024   PCP: Dr. Julieanne Manson REFERRING PROVIDER: Toya Smothers, PA  END OF SESSION:    PT End of Session - 03/07/24 1449     Visit Number 12    Number of Visits 24    Date for PT Re-Evaluation 04/11/24    Progress Note Due on Visit 10    PT Start Time 0249    PT Stop Time 0329    PT Time Calculation (min) 40 min    Equipment Utilized During Treatment Gait belt    Activity Tolerance Patient tolerated treatment well    Behavior During Therapy [facility] for tasks assessed/performed               Past Medical History:  Diagnosis Date   Acute respiratory failure with hypoxia (HCC) 03/21/2016   Atrial fibrillation (HCC) 03/22/2023   CAD (coronary artery disease)    a. s/p CABG;  b. 07/2012 Cath: 3VD including 80 dLCX (med Rx), 4/4 patent grafts.   Chronic diastolic CHF (congestive heart failure) (HCC)    a. 07/2012 Echo: EF 55-60%, no rwma, Gr 1 DD, mild MR;  04/2014 Echo: EF 55-60%, no rwma, mild MR, mildly dil LA.   Diabetes mellitus, type 2 (HCC)    HTN (hypertension)    Hyperlipidemia    Hypothyroidism    Lower extremity edema    a. 03/2014 amlodipine d/c'd.   Spinal stenosis    Stroke (HCC)    1/24 and 5/24   Past Surgical History:  Procedure Laterality Date   APPENDECTOMY     CARDIAC CATHETERIZATION  07/2012   [facility]/no stents   CHOLECYSTECTOMY     CORNEAL TRANSPLANT     CORONARY ARTERY BYPASS GRAFT  1994   HEMORRHOID SURGERY     LEFT HEART CATH AND CORS/GRAFTS ANGIOGRAPHY N/A 12/17/2019   Procedure: LEFT HEART CATH AND CORS/GRAFTS ANGIOGRAPHY;  Surgeon: Yvonne Kendall, MD;  Location: [facility] INVASIVE CV LAB;  Service: Cardiovascular;  Laterality: N/A;   PROSTATE BIOPSY     TOE SURGERY     ingrown toe nail   Patient Active Problem List   Diagnosis Date Noted   Chronic anticoagulation 04/23/2023   Stroke (HCC) 04/06/2023    Type II diabetes mellitus with renal manifestations (HCC) 04/06/2023   Chronic kidney disease, stage 3a (HCC) 04/06/2023   COPD (chronic obstructive pulmonary disease) (HCC) 04/06/2023   Chronic diastolic CHF (congestive heart failure) (HCC) 04/06/2023   Permanent atrial fibrillation (HCC) 04/06/2023   Obesity (BMI 30-39.9) 04/06/2023   Acute cerebrovascular accident (CVA) (HCC) 12/09/2022   Chronic combined systolic and diastolic CHF (congestive heart failure) (HCC) 12/09/2022   Fall at home, initial encounter 12/09/2022   Hypokalemia 12/09/2022   Hemiparesis affecting right side as late effect of cerebrovascular accident (CVA) (HCC) 12/09/2022   Skin tear of left hand without complication 11/16/2022   Pain of left hip 11/16/2022   Left hip pain 11/10/2022   Intertrigo 11/10/2022   COPD with acute exacerbation (HCC) 10/06/2022   Diabetes mellitus (HCC) 06/10/2022   Enlarged prostate 03/02/2022   Other specified counseling 03/02/2022   Renal calculi 03/02/2022   Gastroesophageal reflux disease 03/02/2022   Hypertensive disorder 03/02/2022   Mixed hyperlipidemia 03/02/2022   Hypothyroidism 03/02/2022   Acute non-recurrent frontal sinusitis 01/05/2022   Glands swollen 01/05/2022   Hypertension associated with diabetes (HCC) 01/05/2022   Acute rhinitis 01/05/2022  AV block, Mobitz 1    NSTEMI (non-ST elevated myocardial infarction) (HCC)    ACS (acute coronary syndrome) (HCC) 12/16/2019   Diverticulitis of large intestine without perforation or abscess without bleeding    Pain of upper abdomen    SOB (shortness of breath)    S/P CABG (coronary artery bypass graft)    Back pain, chronic 07/28/2015   Benign fibroma of prostate 07/28/2015   Acid reflux 07/28/2015   Adult hypothyroidism 07/28/2015   Neuropathy 07/28/2015   Arthritis, degenerative 07/28/2015   Psoriasis 07/28/2015   Lumbar spondylosis 05/08/2015   Obesity 09/09/2014   Bilateral leg weakness 09/09/2014   Acute  combined systolic and diastolic heart failure (HCC) 05/13/2014   Bilateral leg edema 04/15/2014   Bradycardia 08/13/2013   Edema 06/21/2012   Weakness 09/15/2011   Type 2 diabetes mellitus with diabetic peripheral angiopathy without gangrene, with long-term current use of insulin (HCC) 09/15/2011   DYSPNEA 06/09/2009   Hyperlipidemia 06/07/2009   Essential hypertension 06/07/2009   Coronary artery disease 06/07/2009    ONSET DATE: Jan 2024  REFERRING DIAG:  R26.81 (ICD-10-CM) - Gait instability  R26.89 (ICD-10-CM) - Decreased mobility    THERAPY DIAG:  Muscle weakness (generalized)  Difficulty in walking, not elsewhere classified  Abnormality of gait  Unsteadiness on feet  Difficulty walking  Other lack of coordination  Imbalance due to old stroke  Rationale for Evaluation and Treatment: Rehabilitation  SUBJECTIVE:                                                                                                                                                                                             SUBJECTIVE STATEMENT:  Pt reports no problems; no stumbles/falls.  Pt reports HEP is going well. Wants to walk more without AD.   Pt accompanied by:  caregiver  PERTINENT HISTORY: From Eval: Patient reports having 2 CVA last year (1 CVA affecting right and 1 affecting left side). Prior to CVA's he reports he was walking normally without an AD. States has been walking with walker ever since.   [facility]- Patient is a 89 y/o veteran presenting with primary c/o difficulty in walking. PMH significant for CVA in Jan 2024 and in May 2024 with some residual R sided weakness.   PAIN:  Are you having pain? No  PRECAUTIONS: Fall  RED FLAGS: None   WEIGHT BEARING RESTRICTIONS: No  FALLS: Has patient fallen in last 6 months? Yes. Number of falls 1  LIVING ENVIRONMENT: Lives with: lives alone and has private  caregivers - everyday  and [facility] caregivers  -3x/week - approx  8 hours/day Lives in: House/apartment Stairs:  has ramp supplied by [place] Has following equipment at home: Single point cane, Walker - 2 wheeled, Wheelchair (manual), Shower bench, bed side commode, Grab bars, Ramped entry, and Transport w/c  PLOF: Independent  PATIENT GOALS: I want to walk without walker independently  OBJECTIVE:  Note: Objective measures were completed at Evaluation unless otherwise noted.  DIAGNOSTIC FINDINGS: CLINICAL DATA:  Stroke, follow up   EXAM: CT HEAD WITHOUT CONTRAST   TECHNIQUE: Contiguous axial images were obtained from the base of the skull through the vertex without intravenous contrast.   RADIATION DOSE REDUCTION: This exam was performed according to the departmental dose-optimization program which includes automated exposure control, adjustment of the mA and/or kV according to patient size and/or use of iterative reconstruction technique.   COMPARISON:  MR Brain 04/06/23, CT head 04/06/23   FINDINGS: Brain: No hemorrhage. No hydrocephalus. There are prominent subarachnoid spaces bilaterally, unchanged. There is sequela of moderate chronic microvascular ischemic change with a chronic infarct in the left corona radiata and an acute infarct in the right basal ganglia, better characterized on recent brain MRI.   Vascular: No hyperdense vessel or unexpected calcification.   Skull: Normal. Negative for fracture or focal lesion.   Sinuses/Orbits: No mastoid or middle ear effusion. Paranasal sinuses are clear. Bilateral lens replacement. Orbits are otherwise unremarkable.   Other: None.   IMPRESSION: 1. No acute intracranial hemorrhage. 2. Acute infarct in the right basal ganglia, better characterized on recent brain MRI.     Electronically Signed   By: Clora Dane M.D.   On: 04/07/2023 16:08  COGNITION: Overall cognitive status: Within functional limits for tasks  assessed   SENSATION: WFL  COORDINATION: WNL  EDEMA:  Some edema noted with lower left LE    POSTURE: rounded shoulders and forward head  LOWER EXTREMITY ROM:     Active  Right Eval Left Eval  Hip flexion    Hip extension    Hip abduction    Hip adduction    Hip internal rotation    Hip external rotation    Knee flexion    Knee extension    Ankle dorsiflexion    Ankle plantarflexion    Ankle inversion    Ankle eversion     (Blank rows = not tested)  LOWER EXTREMITY MMT:    MMT Right Eval Left Eval  Hip flexion 4 4  Hip extension    Hip abduction    Hip adduction    Hip internal rotation 4 4  Hip external rotation 4 4  Knee flexion 4 4  Knee extension 4 4  Ankle dorsiflexion 4 4  Ankle plantarflexion    Ankle inversion    Ankle eversion    (Blank rows = not tested)  BED MOBILITY:  NT but both patient and caregiver report no difficulty with bed mobility  TRANSFERS: Assistive device utilized: Environmental consultant - 2 wheeled  Sit to stand: CGA Stand to sit: CGA Chair to chair: CGA Floor:  not tested  GAIT: Gait pattern: decreased step length- Right and decreased step length- Left Distance walked: > 100 feet  Assistive device utilized: Walker - 2 wheeled Level of assistance: CGA Comments: anterior trunk lean  FUNCTIONAL TESTS:  5 times sit to stand: 22.32 sec without UE support Timed up and go (TUG): 27.94 sec  6 minute walk test: To be assessed visit # 2 10 meter walk test: 16.10 and 14.92 sec with RW=15.51 sec avg or 0.65  m/s using RW  Berg Balance Scale: To be assessed visit #2  PATIENT SURVEYS:  Stroke Impact Scale 52 ABC scale 37.5 %                                                                                                                              TREATMENT DATE: 03/07/24   Stand pivot transfer to arm chair with UE supported only on arm rest only.  Sit<>stand without UE support x 10.  Reciprocal forward step with 1 LE x 10 bil mild  ataxia on the RLE.   Pt desires to return to gait without AD, and hoping to practice on this day. PT instructed pt in increasing distances without AD, 2 x59ft, 2 x85ft, prolongec rest breaks following last 2 bouts, but pt desires to attempt longer distance. Then performed 54ft x 2 and min assist for safety and improved weight shift. Therapeutic rest breaks between bouts. Due to SOB and increased BLE "instability"  Pt continues to question why it is so difficulty to ambulate without AD and education provided on hx on bil CVA affecting RLE>LLE.   Pt also questioning if it would be safe to ambulate with SPC. Education provided for both physical and mental demand of sequencing SPC. Gait with SPC in the LUE 42 ft and RUE x 29ft. Improved sequencing with the LUE< but pt in disagreement.   Gait with rollator x 42ft. Cues for improved elbow flexion to reduce anterior bias.   Sit<>stand without UE support x 10 and CGA for anterior weight shift with fatigue for last 4 repetitions   Encouraged to continue to use RW at this time and only ambulate without AD while in PT to reduce risk of falls.   PATIENT EDUCATION: Education details: exercise technique Person educated: Patient,  Education method: Explanation Education comprehension: verbalized understanding  HOME EXERCISE PROGRAM:  Access Code: [email or web address] URL: [email or web address] Date: 01/25/2024 Prepared by: Carlen Chasten  Exercises - Sit to Stand with Counter Support  - 1 x daily - 7 x weekly - 2 sets - 10 reps - Narrow Stance with Counter Support  - 1 x daily - 7 x weekly - 2 sets - 30 seconds hold - Narrow Stance with Head Nods and Counter Support  - 1 x daily - 7 x weekly - 2 sets - 10 reps   GOALS: Goals reviewed with patient? Yes  SHORT TERM GOALS: Target date: 02/29/2024  Pt will be independent with HEP in order to improve strength and balance in order to decrease fall risk and improve function at home and work.   Baseline: EVAL: no formal HEP in place 4/8: Completing 5 days per week Goal status: MET   LONG TERM GOALS: Target date: 04/11/2024  1.  Patient (> 47 years old) will complete five times sit to stand test in < 15 seconds indicating an increased LE strength and improved balance. Baseline: EVAL: 22.32 sec without UE support 02/27/24: 22.81  sec no UE support Goal status: INITIAL  2.  Pt will improve ABC by at least 13% in order to demonstrate clinically significant improvement in balance confidence Baseline: EVAL: 37.5%  Goal status: INITIAL   3.  Patient will increase Berg Balance score by > 6 points to demonstrate decreased fall risk during functional activities. Baseline: 01/25/2024: 25/56 02/27/24: 31 Goal status: INITIAL   4.   Patient will reduce timed up and go to <11 seconds to reduce fall risk and demonstrate improved transfer/gait ability. Baseline: EVAL: 27.94 with RW 4/8: 35.58 sec with RW Goal status: INITIAL  5.   Patient will increase 10 meter walk test to >1.67m/s as to improve gait speed for better community ambulation and to reduce fall risk. Baseline: EVAL: 0.65 m/s 02/27/24: .43 m/s with RW Goal status: INITIAL  6.   Patient will increase six minute walk test distance to >1000 for progression to community ambulator and improve gait ability Baseline: EVAL: To be assessed  visit #2 4/8: 600 ft  Goal status: INITIAL  7.  Patient will improve score on Stroke Impact Scale 16 by 8 points to signify a higher level of functioning and less difficulty with daily activities.  Baseline: EVAL: 52 02/27/24:55 Goal status: INITIAL   ASSESSMENT:  CLINICAL IMPRESSION:     Patient arrives to treatment session motivated to participate. PT treatment focused on gait with various AD to improve BLE strength in functional movement patterns. Pt continues to voice desire to ambulate without AD, but due to ataxia on the RLE, SOB, and poor sequencing with SPC, should continue to use RW while at  home for safety. Pt will continue to benefit from skilled physical therapy intervention to address impairments, improve QOL, and attain therapy goals.    OBJECTIVE IMPAIRMENTS: Abnormal gait, decreased activity tolerance, decreased balance, decreased endurance, decreased mobility, and decreased strength.   ACTIVITY LIMITATIONS: carrying, lifting, bending, standing, squatting, and stairs  PARTICIPATION LIMITATIONS: cleaning, laundry, driving, shopping, community activity, and yard work  PERSONAL FACTORS: Age and 3+ comorbidities: HTN, CHF, A-FIB, DM  are also affecting patient's functional outcome.   REHAB POTENTIAL: Good  CLINICAL DECISION MAKING: Evolving/moderate complexity  EVALUATION COMPLEXITY: Moderate  PLAN:  PT FREQUENCY: 1-2x/week  PT DURATION: 12 weeks  PLANNED INTERVENTIONS: 97164- PT Re-evaluation, 97110-Therapeutic exercises, 97530- Therapeutic activity, 97112- Neuromuscular re-education, 97535- Self Care, 16109- Manual therapy, [phone]- Gait training, [phone]- Canalith repositioning, [phone]- Electrical stimulation (manual), Patient/Family education, Balance training, Stair training, Dry Needling, Joint mobilization, Vestibular training, DME instructions, Cryotherapy, and Moist heat  PLAN FOR NEXT SESSION:  - review  HEP with education  - balance interventions including: narrow BOS, reaching/turning/rotating, eyes closed, and alternating foot taps - gait training without AD - implement therex/theract/gait training Continue plan -need to recert at 15 for requesting more visits    Aurora Lees PT, DPT  Physical Therapist - [facility]  [facility]  4:21 PM 03/07/24

## 2024-03-11 ENCOUNTER — Ambulatory Visit: Payer: PPO

## 2024-03-12 ENCOUNTER — Ambulatory Visit: Admitting: Physical Therapy

## 2024-03-12 ENCOUNTER — Ambulatory Visit

## 2024-03-12 DIAGNOSIS — R2681 Unsteadiness on feet: Secondary | ICD-10-CM

## 2024-03-12 DIAGNOSIS — M6281 Muscle weakness (generalized): Secondary | ICD-10-CM

## 2024-03-12 DIAGNOSIS — R269 Unspecified abnormalities of gait and mobility: Secondary | ICD-10-CM

## 2024-03-12 DIAGNOSIS — R262 Difficulty in walking, not elsewhere classified: Secondary | ICD-10-CM

## 2024-03-12 NOTE — Therapy (Unsigned)
 OUTPATIENT PHYSICAL THERAPY NEURO TREATMENT   Patient Name: Joseph [name] MRN: [id number] DOB:08-22-1925, 89 y.o., male Today's Date: 03/13/2024   PCP: Dr. Gustavo Leigh REFERRING PROVIDER: Lonne Roan, PA  END OF SESSION:    PT End of Session - 03/13/24 1020     Visit Number 13    Number of Visits 24    Date for PT Re-Evaluation 04/11/24    Progress Note Due on Visit 20    PT Start Time 1628    PT Stop Time 1700    PT Time Calculation (min) 32 min    Equipment Utilized During Treatment Gait belt    Activity Tolerance Patient tolerated treatment well    Behavior During Therapy WFL for tasks assessed/performed                Past Medical History:  Diagnosis Date   Acute respiratory failure with hypoxia (HCC) 03/21/2016   Atrial fibrillation (HCC) 03/22/2023   CAD (coronary artery disease)    a. s/p CABG;  b. 07/2012 Cath: 3VD including 80 dLCX (med Rx), 4/4 patent grafts.   Chronic diastolic CHF (congestive heart failure) (HCC)    a. 07/2012 Echo: EF 55-60%, no rwma, Gr 1 DD, mild MR;  04/2014 Echo: EF 55-60%, no rwma, mild MR, mildly dil LA.   Diabetes mellitus, type 2 (HCC)    HTN (hypertension)    Hyperlipidemia    Hypothyroidism    Lower extremity edema    a. 03/2014 amlodipine  d/c'd.   Spinal stenosis    Stroke (HCC)    1/24 and 5/24   Past Surgical History:  Procedure Laterality Date   APPENDECTOMY     CARDIAC CATHETERIZATION  07/2012   [facility]/no stents   CHOLECYSTECTOMY     CORNEAL TRANSPLANT     CORONARY ARTERY BYPASS GRAFT  1994   HEMORRHOID SURGERY     LEFT HEART CATH AND CORS/GRAFTS ANGIOGRAPHY N/A 12/17/2019   Procedure: LEFT HEART CATH AND CORS/GRAFTS ANGIOGRAPHY;  Surgeon: Sammy Crisp, MD;  Location: [facility] INVASIVE CV LAB;  Service: Cardiovascular;  Laterality: N/A;   PROSTATE BIOPSY     TOE SURGERY     ingrown toe nail   Patient Active Problem List   Diagnosis Date Noted   Chronic anticoagulation 04/23/2023   Stroke (HCC)  04/06/2023   Type II diabetes mellitus with renal manifestations (HCC) 04/06/2023   Chronic kidney disease, stage 3a (HCC) 04/06/2023   COPD (chronic obstructive pulmonary disease) (HCC) 04/06/2023   Chronic diastolic CHF (congestive heart failure) (HCC) 04/06/2023   Permanent atrial fibrillation (HCC) 04/06/2023   Obesity (BMI 30-39.9) 04/06/2023   Acute cerebrovascular accident (CVA) (HCC) 12/09/2022   Chronic combined systolic and diastolic CHF (congestive heart failure) (HCC) 12/09/2022   Fall at home, initial encounter 12/09/2022   Hypokalemia 12/09/2022   Hemiparesis affecting right side as late effect of cerebrovascular accident (CVA) (HCC) 12/09/2022   Skin tear of left hand without complication 11/16/2022   Pain of left hip 11/16/2022   Left hip pain 11/10/2022   Intertrigo 11/10/2022   COPD with acute exacerbation (HCC) 10/06/2022   Diabetes mellitus (HCC) 06/10/2022   Enlarged prostate 03/02/2022   Other specified counseling 03/02/2022   Renal calculi 03/02/2022   Gastroesophageal reflux disease 03/02/2022   Hypertensive disorder 03/02/2022   Mixed hyperlipidemia 03/02/2022   Hypothyroidism 03/02/2022   Acute non-recurrent frontal sinusitis 01/05/2022   Glands swollen 01/05/2022   Hypertension associated with diabetes (HCC) 01/05/2022   Acute rhinitis 01/05/2022  AV block, Mobitz 1    NSTEMI (non-ST elevated myocardial infarction) (HCC)    ACS (acute coronary syndrome) (HCC) 12/16/2019   Diverticulitis of large intestine without perforation or abscess without bleeding    Pain of upper abdomen    SOB (shortness of breath)    S/P CABG (coronary artery bypass graft)    Back pain, chronic 07/28/2015   Benign fibroma of prostate 07/28/2015   Acid reflux 07/28/2015   Adult hypothyroidism 07/28/2015   Neuropathy 07/28/2015   Arthritis, degenerative 07/28/2015   Psoriasis 07/28/2015   Lumbar spondylosis 05/08/2015   Obesity 09/09/2014   Bilateral leg weakness  09/09/2014   Acute combined systolic and diastolic heart failure (HCC) 05/13/2014   Bilateral leg edema 04/15/2014   Bradycardia 08/13/2013   Edema 06/21/2012   Weakness 09/15/2011   Type 2 diabetes mellitus with diabetic peripheral angiopathy without gangrene, with long-term current use of insulin  (HCC) 09/15/2011   DYSPNEA 06/09/2009   Hyperlipidemia 06/07/2009   Essential hypertension 06/07/2009   Coronary artery disease 06/07/2009    ONSET DATE: Jan 2024  REFERRING DIAG:  R26.81 (ICD-10-CM) - Gait instability  R26.89 (ICD-10-CM) - Decreased mobility    THERAPY DIAG:  No diagnosis found.  Rationale for Evaluation and Treatment: Rehabilitation  SUBJECTIVE:                                                                                                                                                                                             SUBJECTIVE STATEMENT:  Pt reports coming to time he thought was right per schedule. Turns out new schedule not provided with updated times. Was able to be seen by Bolivar Bushman. Pt also report billing sent to wrong insurance.   Pt accompanied by:  caregiver  PERTINENT HISTORY: From Eval: Patient reports having 2 CVA last year (1 CVA affecting right and 1 affecting left side). Prior to CVA's he reports he was walking normally without an AD. States has been walking with walker ever since.   [facility]24- Patient is a 89 y/o veteran presenting with primary c/o difficulty in walking. PMH significant for CVA in Jan 2024 and in May 2024 with some residual R sided weakness.   PAIN:  Are you having pain? No  PRECAUTIONS: Fall  RED FLAGS: None   WEIGHT BEARING RESTRICTIONS: No  FALLS: Has patient fallen in last 6 months? Yes. Number of falls 1  LIVING ENVIRONMENT: Lives with: lives alone and has private  caregivers - everyday  and [facility] caregivers -3x/week - approx 8 hours/day Lives in: House/apartment Stairs:  has ramp supplied by  [place] Has  following equipment at home: Single point cane, Walker - 2 wheeled, Wheelchair (manual), Shower bench, bed side commode, Grab bars, Ramped entry, and Transport w/c  PLOF: Independent  PATIENT GOALS: I want to walk without walker independently  OBJECTIVE:  Note: Objective measures were completed at Evaluation unless otherwise noted.  DIAGNOSTIC FINDINGS: CLINICAL DATA:  Stroke, follow up   EXAM: CT HEAD WITHOUT CONTRAST   TECHNIQUE: Contiguous axial images were obtained from the base of the skull through the vertex without intravenous contrast.   RADIATION DOSE REDUCTION: This exam was performed according to the departmental dose-optimization program which includes automated exposure control, adjustment of the mA and/or kV according to patient size and/or use of iterative reconstruction technique.   COMPARISON:  MR Brain 04/06/23, CT head 04/06/23   FINDINGS: Brain: No hemorrhage. No hydrocephalus. There are prominent subarachnoid spaces bilaterally, unchanged. There is sequela of moderate chronic microvascular ischemic change with a chronic infarct in the left corona radiata and an acute infarct in the right basal ganglia, better characterized on recent brain MRI.   Vascular: No hyperdense vessel or unexpected calcification.   Skull: Normal. Negative for fracture or focal lesion.   Sinuses/Orbits: No mastoid or middle ear effusion. Paranasal sinuses are clear. Bilateral lens replacement. Orbits are otherwise unremarkable.   Other: None.   IMPRESSION: 1. No acute intracranial hemorrhage. 2. Acute infarct in the right basal ganglia, better characterized on recent brain MRI.     Electronically Signed   By: Clora Dane M.D.   On: 04/07/2023 16:08  COGNITION: Overall cognitive status: Within functional limits for tasks assessed   SENSATION: WFL  COORDINATION: WNL  EDEMA:  Some edema noted with lower left LE    POSTURE: rounded shoulders and forward  head  LOWER EXTREMITY ROM:     Active  Right Eval Left Eval  Hip flexion    Hip extension    Hip abduction    Hip adduction    Hip internal rotation    Hip external rotation    Knee flexion    Knee extension    Ankle dorsiflexion    Ankle plantarflexion    Ankle inversion    Ankle eversion     (Blank rows = not tested)  LOWER EXTREMITY MMT:    MMT Right Eval Left Eval  Hip flexion 4 4  Hip extension    Hip abduction    Hip adduction    Hip internal rotation 4 4  Hip external rotation 4 4  Knee flexion 4 4  Knee extension 4 4  Ankle dorsiflexion 4 4  Ankle plantarflexion    Ankle inversion    Ankle eversion    (Blank rows = not tested)  BED MOBILITY:  NT but both patient and caregiver report no difficulty with bed mobility  TRANSFERS: Assistive device utilized: Environmental consultant - 2 wheeled  Sit to stand: CGA Stand to sit: CGA Chair to chair: CGA Floor:  not tested  GAIT: Gait pattern: decreased step length- Right and decreased step length- Left Distance walked: > 100 feet  Assistive device utilized: Walker - 2 wheeled Level of assistance: CGA Comments: anterior trunk lean  FUNCTIONAL TESTS:  5 times sit to stand: 22.32 sec without UE support Timed up and go (TUG): 27.94 sec  6 minute walk test: To be assessed visit # 2 10 meter walk test: 16.10 and 14.92 sec with RW=15.51 sec avg or 0.65 m/s using RW  Berg Balance Scale: To be assessed visit #2  PATIENT SURVEYS:  Stroke Impact Scale 52 ABC scale 37.5 %                                                                                                                              TREATMENT DATE: 03/13/24   Unless otherwise stated, CGA/min A was provided and gait belt donned for pt safety throughout session.  TA: Gait with u step walker x 160 ft, improved posture and stability compared to 4WW  - adjusted wheel resistance from off to low, pt felt more control with this setting x 160 ft   -added 3# AW x 160 ft      STS 2x5 hands-free   Step taps x 10 ea LE with 3# AW  Hip ABD x 10 ea LE with 3# AW  Gait with u step walker from gym to well zone gym entrance on way to elevator x 180 ft   PATIENT EDUCATION: Education details: exercise technique Person educated: Patient,  Education method: Explanation Education comprehension: verbalized understanding  HOME EXERCISE PROGRAM:  Access Code: [email or web address] URL: [email or web address] Date: 01/25/2024 Prepared by: Carlen Chasten  Exercises - Sit to Stand with Counter Support  - 1 x daily - 7 x weekly - 2 sets - 10 reps - Narrow Stance with Counter Support  - 1 x daily - 7 x weekly - 2 sets - 30 seconds hold - Narrow Stance with Head Nods and Counter Support  - 1 x daily - 7 x weekly - 2 sets - 10 reps   GOALS: Goals reviewed with patient? Yes  SHORT TERM GOALS: Target date: 02/29/2024  Pt will be independent with HEP in order to improve strength and balance in order to decrease fall risk and improve function at home and work.  Baseline: EVAL: no formal HEP in place 4/8: Completing 5 days per week Goal status: MET   LONG TERM GOALS: Target date: 04/11/2024  1.  Patient (> 27 years old) will complete five times sit to stand test in < 15 seconds indicating an increased LE strength and improved balance. Baseline: EVAL: 22.32 sec without UE support 02/27/24: 22.81 sec no UE support Goal status: INITIAL  2.  Pt will improve ABC by at least 13% in order to demonstrate clinically significant improvement in balance confidence Baseline: EVAL: 37.5%  Goal status: INITIAL   3.  Patient will increase Berg Balance score by > 6 points to demonstrate decreased fall risk during functional activities. Baseline: 01/25/2024: 25/56 02/27/24: 31 Goal status: INITIAL   4.   Patient will reduce timed up and go to <11 seconds to reduce fall risk and demonstrate improved transfer/gait ability. Baseline: EVAL: 27.94 with RW 4/8: 35.58 sec with RW Goal  status: INITIAL  5.   Patient will increase 10 meter walk test to >1.45m/s as to improve gait speed for better community ambulation and to reduce fall risk. Baseline: EVAL: 0.65 m/s 02/27/24: .43 m/s with RW Goal status:  INITIAL  6.   Patient will increase six minute walk test distance to >1000 for progression to community ambulator and improve gait ability Baseline: EVAL: To be assessed  visit #2 4/8: 600 ft  Goal status: INITIAL  7.  Patient will improve score on Stroke Impact Scale 16 by 8 points to signify a higher level of functioning and less difficulty with daily activities.  Baseline: EVAL: 52 02/27/24:55 Goal status: INITIAL   ASSESSMENT:  CLINICAL IMPRESSION:     Pt practiced with u step walker today. Pt appreciated use of this AD as it provided more stability and adjustment of wheel resistance and automatic brake improved his safety with use of it. Pt is unsure of how it would fit in his doorways but will consider this moving forward. Pt Still very unsteady without AD and unsafe for gait without.  Pt will continue to benefit from skilled physical therapy intervention to address impairments, improve QOL, and attain therapy goals.    OBJECTIVE IMPAIRMENTS: Abnormal gait, decreased activity tolerance, decreased balance, decreased endurance, decreased mobility, and decreased strength.   ACTIVITY LIMITATIONS: carrying, lifting, bending, standing, squatting, and stairs  PARTICIPATION LIMITATIONS: cleaning, laundry, driving, shopping, community activity, and yard work  PERSONAL FACTORS: Age and 3+ comorbidities: HTN, CHF, A-FIB, DM  are also affecting patient's functional outcome.   REHAB POTENTIAL: Good  CLINICAL DECISION MAKING: Evolving/moderate complexity  EVALUATION COMPLEXITY: Moderate  PLAN:  PT FREQUENCY: 1-2x/week  PT DURATION: 12 weeks  PLANNED INTERVENTIONS: [id number]- PT Re-evaluation, 97110-Therapeutic exercises, 97530- Therapeutic activity, [id number]- Neuromuscular  re-education, 97535- Self Care, [id number]- Manual therapy, [id number]- Gait training, [phone]- Canalith repositioning, [phone]- Electrical stimulation (manual), Patient/Family education, Balance training, Stair training, Dry Needling, Joint mobilization, Vestibular training, DME instructions, Cryotherapy, and Moist heat  PLAN FOR NEXT SESSION:  - review  HEP with education  - balance interventions including: narrow BOS, reaching/turning/rotating, eyes closed, and alternating foot taps - gait training without AD, encourage AD use also means independence.  - implement therex/theract/gait training Continue plan -need to recert at 15 for requesting more visits   Edwina Gram PT ,DPT Physical Therapist- [facility]  [facility]    10:21 AM 03/13/24

## 2024-03-13 ENCOUNTER — Ambulatory Visit: Payer: PPO

## 2024-03-14 ENCOUNTER — Ambulatory Visit: Admitting: Physical Therapy

## 2024-03-14 DIAGNOSIS — R2681 Unsteadiness on feet: Secondary | ICD-10-CM

## 2024-03-14 DIAGNOSIS — M6281 Muscle weakness (generalized): Secondary | ICD-10-CM

## 2024-03-14 DIAGNOSIS — R269 Unspecified abnormalities of gait and mobility: Secondary | ICD-10-CM

## 2024-03-14 DIAGNOSIS — R262 Difficulty in walking, not elsewhere classified: Secondary | ICD-10-CM

## 2024-03-14 NOTE — Therapy (Signed)
 OUTPATIENT PHYSICAL THERAPY NEURO TREATMENT   Patient Name: Joseph [name] MRN: [id number] DOB:[birth year], 89 y.o., male Today's Date: 03/14/2024   PCP: Dr. Gustavo Leigh REFERRING PROVIDER: Lonne Roan, PA  END OF SESSION:    PT End of Session - 03/14/24 1311     Visit Number 14    Number of Visits 24    Date for PT Re-Evaluation 04/11/24    Progress Note Due on Visit 20    PT Start Time 1315    PT Stop Time 1355    PT Time Calculation (min) 40 min    Equipment Utilized During Treatment Gait belt    Activity Tolerance Patient tolerated treatment well    Behavior During Therapy WFL for tasks assessed/performed                Past Medical History:  Diagnosis Date   Acute respiratory failure with hypoxia (HCC) 03/21/2016   Atrial fibrillation (HCC) 03/22/2023   CAD (coronary artery disease)    a. s/p CABG;  b. 07/2012 Cath: 3VD including 80 dLCX (med Rx), 4/4 patent grafts.   Chronic diastolic CHF (congestive heart failure) (HCC)    a. 07/2012 Echo: EF 55-60%, no rwma, Gr 1 DD, mild MR;  04/2014 Echo: EF 55-60%, no rwma, mild MR, mildly dil LA.   Diabetes mellitus, type 2 (HCC)    HTN (hypertension)    Hyperlipidemia    Hypothyroidism    Lower extremity edema    a. 03/2014 amlodipine  d/c'd.   Spinal stenosis    Stroke (HCC)    1/24 and 5/24   Past Surgical History:  Procedure Laterality Date   APPENDECTOMY     CARDIAC CATHETERIZATION  07/2012   [facility]/no stents   CHOLECYSTECTOMY     CORNEAL TRANSPLANT     CORONARY ARTERY BYPASS GRAFT  1994   HEMORRHOID SURGERY     LEFT HEART CATH AND CORS/GRAFTS ANGIOGRAPHY N/A 12/17/2019   Procedure: LEFT HEART CATH AND CORS/GRAFTS ANGIOGRAPHY;  Surgeon: Sammy Crisp, MD;  Location: [facility] INVASIVE CV LAB;  Service: Cardiovascular;  Laterality: N/A;   PROSTATE BIOPSY     TOE SURGERY     ingrown toe nail   Patient Active Problem List   Diagnosis Date Noted   Chronic anticoagulation 04/23/2023   Stroke (HCC)  04/06/2023   Type II diabetes mellitus with renal manifestations (HCC) 04/06/2023   Chronic kidney disease, stage 3a (HCC) 04/06/2023   COPD (chronic obstructive pulmonary disease) (HCC) 04/06/2023   Chronic diastolic CHF (congestive heart failure) (HCC) 04/06/2023   Permanent atrial fibrillation (HCC) 04/06/2023   Obesity (BMI 30-39.9) 04/06/2023   Acute cerebrovascular accident (CVA) (HCC) 12/09/2022   Chronic combined systolic and diastolic CHF (congestive heart failure) (HCC) 12/09/2022   Fall at home, initial encounter 12/09/2022   Hypokalemia 12/09/2022   Hemiparesis affecting right side as late effect of cerebrovascular accident (CVA) (HCC) 12/09/2022   Skin tear of left hand without complication 11/16/2022   Pain of left hip 11/16/2022   Left hip pain 11/10/2022   Intertrigo 11/10/2022   COPD with acute exacerbation (HCC) 10/06/2022   Diabetes mellitus (HCC) 06/10/2022   Enlarged prostate 03/02/2022   Other specified counseling 03/02/2022   Renal calculi 03/02/2022   Gastroesophageal reflux disease 03/02/2022   Hypertensive disorder 03/02/2022   Mixed hyperlipidemia 03/02/2022   Hypothyroidism 03/02/2022   Acute non-recurrent frontal sinusitis 01/05/2022   Glands swollen 01/05/2022   Hypertension associated with diabetes (HCC) 01/05/2022   Acute rhinitis 01/05/2022  AV block, Mobitz 1    NSTEMI (non-ST elevated myocardial infarction) (HCC)    ACS (acute coronary syndrome) (HCC) 12/16/2019   Diverticulitis of large intestine without perforation or abscess without bleeding    Pain of upper abdomen    SOB (shortness of breath)    S/P CABG (coronary artery bypass graft)    Back pain, chronic 07/28/2015   Benign fibroma of prostate 07/28/2015   Acid reflux 07/28/2015   Adult hypothyroidism 07/28/2015   Neuropathy 07/28/2015   Arthritis, degenerative 07/28/2015   Psoriasis 07/28/2015   Lumbar spondylosis 05/08/2015   Obesity 09/09/2014   Bilateral leg weakness  09/09/2014   Acute combined systolic and diastolic heart failure (HCC) 05/13/2014   Bilateral leg edema 04/15/2014   Bradycardia 08/13/2013   Edema 06/21/2012   Weakness 09/15/2011   Type 2 diabetes mellitus with diabetic peripheral angiopathy without gangrene, with long-term current use of insulin  (HCC) 09/15/2011   DYSPNEA 06/09/2009   Hyperlipidemia 06/07/2009   Essential hypertension 06/07/2009   Coronary artery disease 06/07/2009    ONSET DATE: Jan 2024  REFERRING DIAG:  R26.81 (ICD-10-CM) - Gait instability  R26.89 (ICD-10-CM) - Decreased mobility    THERAPY DIAG:  Difficulty in walking, not elsewhere classified  Abnormality of gait  Unsteadiness on feet  Difficulty walking  Muscle weakness (generalized)  Rationale for Evaluation and Treatment: Rehabilitation  SUBJECTIVE:                                                                                                                                                                                             SUBJECTIVE STATEMENT:  Pt reports doing well. Wants to try walking without AD. Explained need to get a lot of mobility with walker and with better form with walker prior to completing without AD. Was able to show some progress with this over session and was happy to ambulate without AD for last portion of session.   Pt accompanied by:  caregiver  PERTINENT HISTORY: From Eval: Patient reports having 2 CVA last year (1 CVA affecting right and 1 affecting left side). Prior to CVA's he reports he was walking normally without an AD. States has been walking with walker ever since.   [facility]- Patient is a 89 y/o veteran presenting with primary c/o difficulty in walking. PMH significant for CVA in Jan 2024 and in May 2024 with some residual R sided weakness.   PAIN:  Are you having pain? No  PRECAUTIONS: Fall  RED FLAGS: None   WEIGHT BEARING RESTRICTIONS: No  FALLS: Has patient fallen in last  6 months? Yes. Number of  falls 1  LIVING ENVIRONMENT: Lives with: lives alone and has private  caregivers - everyday  and [facility] caregivers -3x/week - approx 8 hours/day Lives in: House/apartment Stairs:  has ramp supplied by [place] Has following equipment at home: Single point cane, Environmental consultant - 2 wheeled, Wheelchair (manual), Shower bench, bed side commode, Grab bars, Ramped entry, and Transport w/c  PLOF: Independent  PATIENT GOALS: I want to walk without walker independently  OBJECTIVE:  Note: Objective measures were completed at Evaluation unless otherwise noted.  DIAGNOSTIC FINDINGS: CLINICAL DATA:  Stroke, follow up   EXAM: CT HEAD WITHOUT CONTRAST   TECHNIQUE: Contiguous axial images were obtained from the base of the skull through the vertex without intravenous contrast.   RADIATION DOSE REDUCTION: This exam was performed according to the departmental dose-optimization program which includes automated exposure control, adjustment of the mA and/or kV according to patient size and/or use of iterative reconstruction technique.   COMPARISON:  MR Brain 04/06/23, CT head 04/06/23   FINDINGS: Brain: No hemorrhage. No hydrocephalus. There are prominent subarachnoid spaces bilaterally, unchanged. There is sequela of moderate chronic microvascular ischemic change with a chronic infarct in the left corona radiata and an acute infarct in the right basal ganglia, better characterized on recent brain MRI.   Vascular: No hyperdense vessel or unexpected calcification.   Skull: Normal. Negative for fracture or focal lesion.   Sinuses/Orbits: No mastoid or middle ear effusion. Paranasal sinuses are clear. Bilateral lens replacement. Orbits are otherwise unremarkable.   Other: None.   IMPRESSION: 1. No acute intracranial hemorrhage. 2. Acute infarct in the right basal ganglia, better characterized on recent brain MRI.     Electronically Signed   By: Clora Dane M.D.   On: 04/07/2023  16:08  COGNITION: Overall cognitive status: Within functional limits for tasks assessed   SENSATION: WFL  COORDINATION: WNL  EDEMA:  Some edema noted with lower left LE    POSTURE: rounded shoulders and forward head  LOWER EXTREMITY ROM:     Active  Right Eval Left Eval  Hip flexion    Hip extension    Hip abduction    Hip adduction    Hip internal rotation    Hip external rotation    Knee flexion    Knee extension    Ankle dorsiflexion    Ankle plantarflexion    Ankle inversion    Ankle eversion     (Blank rows = not tested)  LOWER EXTREMITY MMT:    MMT Right Eval Left Eval  Hip flexion 4 4  Hip extension    Hip abduction    Hip adduction    Hip internal rotation 4 4  Hip external rotation 4 4  Knee flexion 4 4  Knee extension 4 4  Ankle dorsiflexion 4 4  Ankle plantarflexion    Ankle inversion    Ankle eversion    (Blank rows = not tested)  BED MOBILITY:  NT but both patient and caregiver report no difficulty with bed mobility  TRANSFERS: Assistive device utilized: Environmental consultant - 2 wheeled  Sit to stand: CGA Stand to sit: CGA Chair to chair: CGA Floor:  not tested  GAIT: Gait pattern: decreased step length- Right and decreased step length- Left Distance walked: > 100 feet  Assistive device utilized: Walker - 2 wheeled Level of assistance: CGA Comments: anterior trunk lean  FUNCTIONAL TESTS:  5 times sit to stand: 22.32 sec without UE support Timed up and go (TUG): 27.94 sec  6  minute walk test: To be assessed visit # 2 10 meter walk test: 16.10 and 14.92 sec with RW=15.51 sec avg or 0.65 m/s using RW  Berg Balance Scale: To be assessed visit #2  PATIENT SURVEYS:  Stroke Impact Scale 52 ABC scale 37.5 %                                                                                                                              TREATMENT DATE: 03/14/24   Unless otherwise stated, CGA/min A was provided and gait belt donned for pt safety  throughout session.  TA: Gait with u step walker x 160 ft, improved posture and stability compared to 4WW  -added 3# AW 2  x 160 ft   Step ups with UE assist, cues for as much LE propulsion as possible. 2 x 10 ea LE, CGA, amualtes from seat to steps ( 4 ft) without device   Gait training  Gait x 20 ft with turn around obstacle, no AD, close CGA, slight ataxia rest then ambulates around other side for opposite direction turn. Cues for erect posture and for forward gaze   Gait x 25 ft no AD, close CGA, then stand to sit transfer to transport chair with conscious no UE use to end session. Cues for erect posture and for forward gaze   TE  LAQ x 10 ea LE with 3# AW, cues for erect posture to improve core activation  Seated march x 10 ea LE cues for erect posture to improve core activation    PATIENT EDUCATION: Education details: exercise technique Person educated: Patient,  Education method: Explanation Education comprehension: verbalized understanding  HOME EXERCISE PROGRAM:  Access Code: [email or web address] URL: [email or web address] Date: 01/25/2024 Prepared by: Carlen Chasten  Exercises - Sit to Stand with Counter Support  - 1 x daily - 7 x weekly - 2 sets - 10 reps - Narrow Stance with Counter Support  - 1 x daily - 7 x weekly - 2 sets - 30 seconds hold - Narrow Stance with Head Nods and Counter Support  - 1 x daily - 7 x weekly - 2 sets - 10 reps   GOALS: Goals reviewed with patient? Yes  SHORT TERM GOALS: Target date: 02/29/2024  Pt will be independent with HEP in order to improve strength and balance in order to decrease fall risk and improve function at home and work.  Baseline: EVAL: no formal HEP in place 4/8: Completing 5 days per week Goal status: MET   LONG TERM GOALS: Target date: 04/11/2024  1.  Patient (> 1 years old) will complete five times sit to stand test in < 15 seconds indicating an increased LE strength and improved balance. Baseline: EVAL: 22.32  sec without UE support 02/27/24: 22.81 sec no UE support Goal status: INITIAL  2.  Pt will improve ABC by at least 13% in order to demonstrate clinically significant improvement in balance confidence Baseline: EVAL: 37.5%  Goal status: INITIAL   3.  Patient will increase Berg Balance score by > 6 points to demonstrate decreased fall risk during functional activities. Baseline: 01/25/2024: 25/56 02/27/24: 31 Goal status: INITIAL   4.   Patient will reduce timed up and go to <11 seconds to reduce fall risk and demonstrate improved transfer/gait ability. Baseline: EVAL: 27.94 with RW 4/8: 35.58 sec with RW Goal status: INITIAL  5.   Patient will increase 10 meter walk test to >1.66m/s as to improve gait speed for better community ambulation and to reduce fall risk. Baseline: EVAL: 0.65 m/s 02/27/24: .43 m/s with RW Goal status: INITIAL  6.   Patient will increase six minute walk test distance to >1000 for progression to community ambulator and improve gait ability Baseline: EVAL: To be assessed  visit #2 4/8: 600 ft  Goal status: INITIAL  7.  Patient will improve score on Stroke Impact Scale 16 by 8 points to signify a higher level of functioning and less difficulty with daily activities.  Baseline: EVAL: 52 02/27/24:55 Goal status: INITIAL   ASSESSMENT:  CLINICAL IMPRESSION:     Pt practiced with u step walker today. Pt continued with use of U step walker with focus on erect posture and forward gaze to decrease reliance on AD for balance. Pt progressed well with this but was fatigued. Later tried gait without AD and pt had some ataxia but overall tolerated well. Will continue to progress endurance with AD and coordination and balance without AD as appropriate.  Will need goal assessment next visit for submission for more visits. Pt will continue to benefit from skilled physical therapy intervention to address impairments, improve QOL, and attain therapy goals.    OBJECTIVE IMPAIRMENTS:  Abnormal gait, decreased activity tolerance, decreased balance, decreased endurance, decreased mobility, and decreased strength.   ACTIVITY LIMITATIONS: carrying, lifting, bending, standing, squatting, and stairs  PARTICIPATION LIMITATIONS: cleaning, laundry, driving, shopping, community activity, and yard work  PERSONAL FACTORS: Age and 3+ comorbidities: HTN, CHF, A-FIB, DM  are also affecting patient's functional outcome.   REHAB POTENTIAL: Good  CLINICAL DECISION MAKING: Evolving/moderate complexity  EVALUATION COMPLEXITY: Moderate  PLAN:  PT FREQUENCY: 1-2x/week  PT DURATION: 12 weeks  PLANNED INTERVENTIONS: [id number]- PT Re-evaluation, 97110-Therapeutic exercises, 97530- Therapeutic activity, [id number]- Neuromuscular re-education, 97535- Self Care, [id number]- Manual therapy, [id number]- Gait training, [id number]- Canalith repositioning, [id number]- Electrical stimulation (manual), Patient/Family education, Balance training, Stair training, Dry Needling, Joint mobilization, Vestibular training, DME instructions, Cryotherapy, and Moist heat  PLAN FOR NEXT SESSION:  - review  HEP with education  - balance interventions including: narrow BOS, reaching/turning/rotating, eyes closed, and alternating foot taps - gait training without AD, encourage AD use also means independence ( very reluctant to AD use).  - implement therex/theract/gait training Continue plan -need to recert at 15 for requesting more visits   Edwina Gram PT ,DPT Physical Therapist- [facility]  [facility]    1:24 PM 03/14/24

## 2024-03-18 ENCOUNTER — Ambulatory Visit: Admitting: Physical Therapy

## 2024-03-18 ENCOUNTER — Ambulatory Visit: Payer: PPO

## 2024-03-19 ENCOUNTER — Ambulatory Visit: Payer: PPO | Admitting: Cardiovascular Disease

## 2024-03-19 ENCOUNTER — Ambulatory Visit

## 2024-03-20 ENCOUNTER — Ambulatory Visit: Payer: PPO

## 2024-03-21 ENCOUNTER — Ambulatory Visit: Attending: Physician Assistant

## 2024-03-21 DIAGNOSIS — R2689 Other abnormalities of gait and mobility: Secondary | ICD-10-CM | POA: Diagnosis present

## 2024-03-21 DIAGNOSIS — I69398 Other sequelae of cerebral infarction: Secondary | ICD-10-CM | POA: Insufficient documentation

## 2024-03-21 DIAGNOSIS — R2681 Unsteadiness on feet: Secondary | ICD-10-CM | POA: Insufficient documentation

## 2024-03-21 DIAGNOSIS — R262 Difficulty in walking, not elsewhere classified: Secondary | ICD-10-CM | POA: Diagnosis present

## 2024-03-21 DIAGNOSIS — M6281 Muscle weakness (generalized): Secondary | ICD-10-CM | POA: Insufficient documentation

## 2024-03-21 DIAGNOSIS — R278 Other lack of coordination: Secondary | ICD-10-CM | POA: Diagnosis present

## 2024-03-21 DIAGNOSIS — R269 Unspecified abnormalities of gait and mobility: Secondary | ICD-10-CM | POA: Diagnosis present

## 2024-03-21 NOTE — Therapy (Incomplete)
 OUTPATIENT PHYSICAL THERAPY NEURO TREATMENT/RECERT   Patient Name: Joseph [name] MRN: [id number] DOB:16-May-1925, 89 y.o., male Today's Date: 03/25/2024   PCP: Dr. Gustavo Leigh REFERRING PROVIDER: Lonne Roan, PA  END OF SESSION:    PT End of Session - 03/25/24 1215     Visit Number 15    Number of Visits 39    Date for PT Re-Evaluation 06/13/24    Progress Note Due on Visit 20    PT Start Time 1402    PT Stop Time 1446    PT Time Calculation (min) 44 min    Equipment Utilized During Treatment Gait belt    Activity Tolerance Patient tolerated treatment well    Behavior During Therapy WFL for tasks assessed/performed                  Past Medical History:  Diagnosis Date   Acute respiratory failure with hypoxia (HCC) 03/21/2016   Atrial fibrillation (HCC) 03/22/2023   CAD (coronary artery disease)    a. s/p CABG;  b. 07/2012 Cath: 3VD including 80 dLCX (med Rx), 4/4 patent grafts.   Chronic diastolic CHF (congestive heart failure) (HCC)    a. 07/2012 Echo: EF 55-60%, no rwma, Gr 1 DD, mild MR;  04/2014 Echo: EF 55-60%, no rwma, mild MR, mildly dil LA.   Diabetes mellitus, type 2 (HCC)    HTN (hypertension)    Hyperlipidemia    Hypothyroidism    Lower extremity edema    a. 03/2014 amlodipine  d/c'd.   Spinal stenosis    Stroke (HCC)    1/24 and 5/24   Past Surgical History:  Procedure Laterality Date   APPENDECTOMY     CARDIAC CATHETERIZATION  07/2012   [facility]/no stents   CHOLECYSTECTOMY     CORNEAL TRANSPLANT     CORONARY ARTERY BYPASS GRAFT  1994   HEMORRHOID SURGERY     LEFT HEART CATH AND CORS/GRAFTS ANGIOGRAPHY N/A 12/17/2019   Procedure: LEFT HEART CATH AND CORS/GRAFTS ANGIOGRAPHY;  Surgeon: Sammy Crisp, MD;  Location: [facility] INVASIVE CV LAB;  Service: Cardiovascular;  Laterality: N/A;   PROSTATE BIOPSY     TOE SURGERY     ingrown toe nail   Patient Active Problem List   Diagnosis Date Noted   Chronic anticoagulation 04/23/2023   Stroke  (HCC) 04/06/2023   Type II diabetes mellitus with renal manifestations (HCC) 04/06/2023   Chronic kidney disease, stage 3a (HCC) 04/06/2023   COPD (chronic obstructive pulmonary disease) (HCC) 04/06/2023   Chronic diastolic CHF (congestive heart failure) (HCC) 04/06/2023   Permanent atrial fibrillation (HCC) 04/06/2023   Obesity (BMI 30-39.9) 04/06/2023   Acute cerebrovascular accident (CVA) (HCC) 12/09/2022   Chronic combined systolic and diastolic CHF (congestive heart failure) (HCC) 12/09/2022   Fall at home, initial encounter 12/09/2022   Hypokalemia 12/09/2022   Hemiparesis affecting right side as late effect of cerebrovascular accident (CVA) (HCC) 12/09/2022   Skin tear of left hand without complication 11/16/2022   Pain of left hip 11/16/2022   Left hip pain 11/10/2022   Intertrigo 11/10/2022   COPD with acute exacerbation (HCC) 10/06/2022   Diabetes mellitus (HCC) 06/10/2022   Enlarged prostate 03/02/2022   Other specified counseling 03/02/2022   Renal calculi 03/02/2022   Gastroesophageal reflux disease 03/02/2022   Hypertensive disorder 03/02/2022   Mixed hyperlipidemia 03/02/2022   Hypothyroidism 03/02/2022   Acute non-recurrent frontal sinusitis 01/05/2022   Glands swollen 01/05/2022   Hypertension associated with diabetes (HCC) 01/05/2022   Acute rhinitis  01/05/2022   AV block, Mobitz 1    NSTEMI (non-ST elevated myocardial infarction) (HCC)    ACS (acute coronary syndrome) (HCC) 12/16/2019   Diverticulitis of large intestine without perforation or abscess without bleeding    Pain of upper abdomen    SOB (shortness of breath)    S/P CABG (coronary artery bypass graft)    Back pain, chronic 07/28/2015   Benign fibroma of prostate 07/28/2015   Acid reflux 07/28/2015   Adult hypothyroidism 07/28/2015   Neuropathy 07/28/2015   Arthritis, degenerative 07/28/2015   Psoriasis 07/28/2015   Lumbar spondylosis 05/08/2015   Obesity 09/09/2014   Bilateral leg weakness  09/09/2014   Acute combined systolic and diastolic heart failure (HCC) 05/13/2014   Bilateral leg edema 04/15/2014   Bradycardia 08/13/2013   Edema 06/21/2012   Weakness 09/15/2011   Type 2 diabetes mellitus with diabetic peripheral angiopathy without gangrene, with long-term current use of insulin  (HCC) 09/15/2011   DYSPNEA 06/09/2009   Hyperlipidemia 06/07/2009   Essential hypertension 06/07/2009   Coronary artery disease 06/07/2009    ONSET DATE: Jan 2024  REFERRING DIAG:  R26.81 (ICD-10-CM) - Gait instability  R26.89 (ICD-10-CM) - Decreased mobility    THERAPY DIAG:  Difficulty in walking, not elsewhere classified - Plan: PT plan of care cert/re-cert  Abnormality of gait - Plan: PT plan of care cert/re-cert  Unsteadiness on feet - Plan: PT plan of care cert/re-cert  Difficulty walking - Plan: PT plan of care cert/re-cert  Muscle weakness (generalized) - Plan: PT plan of care cert/re-cert  Other lack of coordination - Plan: PT plan of care cert/re-cert  Imbalance due to old stroke - Plan: PT plan of care cert/re-cert  Rationale for Evaluation and Treatment: Rehabilitation  SUBJECTIVE:                                                                                                                                                                                             SUBJECTIVE STATEMENT:   Patient reports doing okay- states working some and walking at home. States pleased with attempting some walking last visit without an AD.  Pt accompanied by:  caregiver  PERTINENT HISTORY: From Eval: Patient reports having 2 CVA last year (1 CVA affecting right and 1 affecting left side). Prior to CVA's he reports he was walking normally without an AD. States has been walking with walker ever since.   [facility]- Patient is a 89 y/o veteran presenting with primary c/o difficulty in walking. PMH significant for CVA in Jan 2024 and in May 2024 with some residual  R sided weakness.  PAIN:  Are you having pain? No  PRECAUTIONS: Fall  RED FLAGS: None   WEIGHT BEARING RESTRICTIONS: No  FALLS: Has patient fallen in last 6 months? Yes. Number of falls 1  LIVING ENVIRONMENT: Lives with: lives alone and has private  caregivers - everyday  and [facility] caregivers -3x/week - approx 8 hours/day Lives in: House/apartment Stairs:  has ramp supplied by [place] Has following equipment at home: Single point cane, Environmental consultant - 2 wheeled, Wheelchair (manual), Shower bench, bed side commode, Grab bars, Ramped entry, and Transport w/c  PLOF: Independent  PATIENT GOALS: I want to walk without walker independently  OBJECTIVE:  Note: Objective measures were completed at Evaluation unless otherwise noted.  DIAGNOSTIC FINDINGS: CLINICAL DATA:  Stroke, follow up   EXAM: CT HEAD WITHOUT CONTRAST   TECHNIQUE: Contiguous axial images were obtained from the base of the skull through the vertex without intravenous contrast.   RADIATION DOSE REDUCTION: This exam was performed according to the departmental dose-optimization program which includes automated exposure control, adjustment of the mA and/or kV according to patient size and/or use of iterative reconstruction technique.   COMPARISON:  MR Brain 04/06/23, CT head 04/06/23   FINDINGS: Brain: No hemorrhage. No hydrocephalus. There are prominent subarachnoid spaces bilaterally, unchanged. There is sequela of moderate chronic microvascular ischemic change with a chronic infarct in the left corona radiata and an acute infarct in the right basal ganglia, better characterized on recent brain MRI.   Vascular: No hyperdense vessel or unexpected calcification.   Skull: Normal. Negative for fracture or focal lesion.   Sinuses/Orbits: No mastoid or middle ear effusion. Paranasal sinuses are clear. Bilateral lens replacement. Orbits are otherwise unremarkable.   Other: None.   IMPRESSION: 1. No acute intracranial  hemorrhage. 2. Acute infarct in the right basal ganglia, better characterized on recent brain MRI.     Electronically Signed   By: Clora Dane M.D.   On: 04/07/2023 16:08  COGNITION: Overall cognitive status: Within functional limits for tasks assessed   SENSATION: WFL  COORDINATION: WNL  EDEMA:  Some edema noted with lower left LE    POSTURE: rounded shoulders and forward head  LOWER EXTREMITY ROM:     Active  Right Eval Left Eval  Hip flexion    Hip extension    Hip abduction    Hip adduction    Hip internal rotation    Hip external rotation    Knee flexion    Knee extension    Ankle dorsiflexion    Ankle plantarflexion    Ankle inversion    Ankle eversion     (Blank rows = not tested)  LOWER EXTREMITY MMT:    MMT Right Eval Left Eval  Hip flexion 4 4  Hip extension    Hip abduction    Hip adduction    Hip internal rotation 4 4  Hip external rotation 4 4  Knee flexion 4 4  Knee extension 4 4  Ankle dorsiflexion 4 4  Ankle plantarflexion    Ankle inversion    Ankle eversion    (Blank rows = not tested)  BED MOBILITY:  NT but both patient and caregiver report no difficulty with bed mobility  TRANSFERS: Assistive device utilized: Environmental consultant - 2 wheeled  Sit to stand: CGA Stand to sit: CGA Chair to chair: CGA Floor:  not tested  GAIT: Gait pattern: decreased step length- Right and decreased step length- Left Distance walked: > 100 feet  Assistive device utilized: Walker - 2  wheeled Level of assistance: CGA Comments: anterior trunk lean  FUNCTIONAL TESTS:  5 times sit to stand: 22.32 sec without UE support Timed up and go (TUG): 27.94 sec  6 minute walk test: To be assessed visit # 2 10 meter walk test: 16.10 and 14.92 sec with RW=15.51 sec avg or 0.65 m/s using RW  Berg Balance Scale: To be assessed visit #2  PATIENT SURVEYS:  Stroke Impact Scale 52 ABC scale 37.5 %                                                                                                                               TREATMENT DATE: 03/25/24   Unless otherwise stated, CGA/min A was provided and gait belt donned for pt safety throughout session.  Physical therapy treatment session today consisted of completing assessment of goals and administration of testing as demonstrated and documented in flow sheet, treatment, and goals section of this note. Addition treatments may be found below.   Pt performed 5 time sit<>stand (5xSTS): 16.17 sec (>15 sec indicates increased fall risk)    10 Meter Walk Test: Patient instructed to walk 10 meters (32.8 ft) as quickly and as safely as possible at their normal speed x2 and at a fast speed x2. Time measured from 2 meter mark to 8 meter mark to accommodate ramp-up and ramp-down.  Normal speed 1: 0.69 m/s Normal speed 2: 0.73 m/s Average Normal speed: 0.71 m/s Cut off scores: <0.4 m/s = household Ambulator, 0.4-0.8 m/s = limited community Ambulator, >0.8 m/s = community Ambulator, >1.2 m/s = crossing a street, <1.0 = increased fall risk MCID 0.05 m/s (small), 0.13 m/s (moderate), 0.06 m/s (significant)  (ANPTA Core Set of Outcome Measures for Adults with Neurologic Conditions, 2018)   6 Min Walk Test:  Instructed patient to ambulate as quickly and as safely as possible for 6 minutes using LRAD. Patient was allowed to take standing rest breaks without stopping the test, but if the patient required a sitting rest break the clock would be stopped and the test would be over.  Results: 600 feet in 5 min (stopped today due to fatigue)  using a RW with CGA. Results indicate that the patient has reduced endurance with ambulation compared to age matched norms.  Age Matched Norms: 56-69 yo M: 8 F: 45, 39-79 yo M: 33 F: 471, 2-89 yo M: 417 F: 392 MDC: 58.21 meters (190.98 feet) or 50 meters (ANPTA Core Set of Outcome Measures for Adults with Neurologic Conditions, 2018)   PT instructed pt in TUG: 26.88 sec (average of 3  trials; >13.5 sec indicates increased fall risk)   Stroke Impact Scale 16 (Copyrighted instrument, [facility])  In the past 2 weeks, how difficult was it to...  Rating Scale 5 = Not difficult at all 4 = A little difficult 3 = Somewhat difficult 2 = Very difficult 1 = Could not do at all  a.  Dress the top part of your body? 4  b. Bathe yourself? 3  c. Get to the toilet on time? 5  d. Control your bladder (not have an accident)? 5  e. Control your bowels (not have an accident)? 4  f. Stand without losing balance? 5  g. Go shopping? 4  h. Do heavy household chores (e.g. vacuum, laundry or  yard work)? 2  i. Stay sitting without losing your balance? 5  j. Walk without losing your balance? 5  k. Move from a bed to a chair? 4  l. Walk fast? 4  m. Climb one flight of stairs? 2  n. Walk one block? 4  o. Get in and out of a car? 4  p. Carry heavy objects (e.g. bag of groceries) with your  affected hand? 4  Sum:  64/80  MDC (Minimal Detectable Change) is >/=8      PATIENT EDUCATION: Education details: exercise technique Person educated: Patient,  Education method: Explanation Education comprehension: verbalized understanding  HOME EXERCISE PROGRAM:  Access Code: [email or web address] URL: [email or web address] Date: 01/25/2024 Prepared by: Carlen Chasten  Exercises - Sit to Stand with Counter Support  - 1 x daily - 7 x weekly - 2 sets - 10 reps - Narrow Stance with Counter Support  - 1 x daily - 7 x weekly - 2 sets - 30 seconds hold - Narrow Stance with Head Nods and Counter Support  - 1 x daily - 7 x weekly - 2 sets - 10 reps   GOALS: Goals reviewed with patient? Yes  SHORT TERM GOALS: Target date: 02/29/2024  Pt will be independent with HEP in order to improve strength and balance in order to decrease fall risk and improve function at home and work.  Baseline: EVAL: no formal HEP in place 4/8: Completing 5 days per week Goal status:  MET   LONG TERM GOALS: Target date: 06/13/2024  1.  Patient (> 35 years old) will complete five times sit to stand test in < 15 seconds indicating an increased LE strength and improved balance. Baseline: EVAL: 22.32 sec without UE support 02/27/24: 22.81 sec no UE support; 03/21/2024= 16.17 sec without UE Support Goal status: PROGRESSING  2.  Pt will improve ABC by at least 13% in order to demonstrate clinically significant improvement in balance confidence Baseline: EVAL: 37.5%; 03/21/2024= will reassess next visit Goal status: INITIAL   3.  Patient will increase Berg Balance score by > 6 points to demonstrate decreased fall risk during functional activities. Baseline: 01/25/2024: 25/56 02/27/24: 31; 03/21/2024- Will reassess next visit. Goal status: INITIAL   4.   Patient will reduce timed up and go to <11 seconds to reduce fall risk and demonstrate improved transfer/gait ability. Baseline: EVAL: 27.94 with RW 4/8: 35.58 sec with RW; 03/21/2024= 26.88 sec Goal status: PROGRESSING  5.   Patient will increase 10 meter walk test to >1.65m/s as to improve gait speed for better community ambulation and to reduce fall risk. Baseline: EVAL: 0.65 m/s 02/27/24: .43 m/s with RW; 03/21/2024= 0.71 m/s using RW Goal status: PROGRESSING   6.   Patient will increase six minute walk test distance to >1000 for progression to community ambulator and improve gait ability Baseline: EVAL: To be assessed  visit #2 4/8: 600 ft; 03/21/2024= 600 feet in 5 min using RW- stopped due to fatigue. Goal status: PROGRESSING  7.  Patient will improve score on Stroke Impact Scale 16 by 8 points to signify a higher level of functioning and  less difficulty with daily activities.  Baseline: EVAL: 52 02/27/24:55; 03/21/2024=64 Goal status: PROGRESSING   ASSESSMENT:  CLINICAL IMPRESSION:     Patient presents today in good spirits for recert visit. Reassessed as many goals as time allowed today. Patient is making some progress toward  functional goals- demonstrating improved gait speed today and ambulated the same distance in 6 min walk but completed the distance 1 min faster. He was fatigued and unable to continue at that point so that will remain a primary focus to continue to work on his overall functional endurance to enable him to go out in community safely. He demonstrated some progress with functional LE strength as seen by decreased time with 5x sit to stand test. He remains at increased risk of falling and instructed him to continue to use walker for safe mobility at this time. Patient's condition has the potential to improve in response to therapy. Maximum improvement is yet to be obtained. The anticipated improvement is attainable and reasonable in a generally predictable time.  Pt will continue to benefit from skilled physical therapy intervention to address impairments, improve QOL, and attain therapy goals.    OBJECTIVE IMPAIRMENTS: Abnormal gait, decreased activity tolerance, decreased balance, decreased endurance, decreased mobility, and decreased strength.   ACTIVITY LIMITATIONS: carrying, lifting, bending, standing, squatting, and stairs  PARTICIPATION LIMITATIONS: cleaning, laundry, driving, shopping, community activity, and yard work  PERSONAL FACTORS: Age and 3+ comorbidities: HTN, CHF, A-FIB, DM  are also affecting patient's functional outcome.   REHAB POTENTIAL: Good  CLINICAL DECISION MAKING: Evolving/moderate complexity  EVALUATION COMPLEXITY: Moderate  PLAN:  PT FREQUENCY: 1-2x/week  PT DURATION: 12 weeks  PLANNED INTERVENTIONS: [id number]- PT Re-evaluation, 97110-Therapeutic exercises, 97530- Therapeutic activity, [id number]- Neuromuscular re-education, 97535- Self Care, [id number]- Manual therapy, [id number]- Gait training, [phone]- Canalith repositioning, [phone]- Electrical stimulation (manual), Patient/Family education, Balance training, Stair training, Dry Needling, Joint mobilization, Vestibular training, DME  instructions, Cryotherapy, and Moist heat  PLAN FOR NEXT SESSION:  - review  HEP with education  - balance interventions including: narrow BOS, reaching/turning/rotating, eyes closed, and alternating foot taps - gait training without AD, encourage AD use also means independence ( very reluctant to AD use).  - implement therex/theract/gait training Continue plan -BERG reassessment and ABC scale next visit - reassess these 2 goals.   Murlene Army PT  Physical Therapist- [facility]   12:15 PM 03/25/24

## 2024-03-22 ENCOUNTER — Ambulatory Visit: Attending: Cardiovascular Disease | Admitting: Cardiovascular Disease

## 2024-03-22 ENCOUNTER — Telehealth: Payer: Self-pay | Admitting: Cardiovascular Disease

## 2024-03-22 ENCOUNTER — Encounter: Payer: Self-pay | Admitting: Cardiovascular Disease

## 2024-03-22 VITALS — BP 144/78 | HR 60 | Ht 67.0 in | Wt 213.0 lb

## 2024-03-22 DIAGNOSIS — E1151 Type 2 diabetes mellitus with diabetic peripheral angiopathy without gangrene: Secondary | ICD-10-CM

## 2024-03-22 DIAGNOSIS — I5022 Chronic systolic (congestive) heart failure: Secondary | ICD-10-CM

## 2024-03-22 DIAGNOSIS — I25118 Atherosclerotic heart disease of native coronary artery with other forms of angina pectoris: Secondary | ICD-10-CM | POA: Diagnosis not present

## 2024-03-22 DIAGNOSIS — E1159 Type 2 diabetes mellitus with other circulatory complications: Secondary | ICD-10-CM

## 2024-03-22 DIAGNOSIS — E782 Mixed hyperlipidemia: Secondary | ICD-10-CM

## 2024-03-22 DIAGNOSIS — I480 Paroxysmal atrial fibrillation: Secondary | ICD-10-CM

## 2024-03-22 DIAGNOSIS — Z794 Long term (current) use of insulin: Secondary | ICD-10-CM

## 2024-03-22 DIAGNOSIS — I152 Hypertension secondary to endocrine disorders: Secondary | ICD-10-CM

## 2024-03-22 NOTE — Telephone Encounter (Signed)
 The patient's daughter called, stating that the patient has an appointment scheduled with Dr. Gollan today but she is unable to attend. She mentioned having some concerns she would like addressed at the appointment, as the caregiver is uncomfortable discussing these matters in front of the patient. She also feels that the patient may not be forthcoming with these issues.  Concerns to address:  Weight gain: The daughter reported the patient's weight was 208 pounds in December and is now 213 pounds. She believes he may be eating out of boredom, though she denies any significant weight increase.  Swelling in ankles and feet: Noted swelling.  Labored breathing with exertion: The daughter reported that the patient's labored breathing with exertion seems to be getting worse.  Confusion: The daughter mentioned that the patient is starting to show signs of confusion over even minor matters.

## 2024-03-22 NOTE — Progress Notes (Signed)
 Cardiology Office Note  Date:  03/22/2024   ID:  Joseph [name], DOB [birth year], MRN [id number]  PCP:  Joseph Barters, MD   Chief Complaint  Patient presents with   Follow-up    Patient having shortness of breath with walking to the bathroom & has bilateral LE edema with some concerns of an increase in weight; see phone note from today.     HPI:  [name] is a 89 year old gentleman with a hx of  CAD, CABG in 1994,  PAD , followed by Dr.  Vonna [name],  obesity,  hyperlipidemia,  arthritis of the knees,   fatigue, SOB, leg weakness,   lower extremity edema,  Carotid u/s  Less than 39% stenosis bilaterally AV block on beta-blockers Dilated cardiomyopathy ejection fraction 35 to 40% in June 2023 Ef 45 to 50% in may 2024 CVA in Jan 2024 presenting for routine followup of his coronary artery disease and  chronic diastolic CHF, persistent atrial fibrillation  Last seen in clinic by myself July 2020 for In follow-up today he presents with caretaker Weight has been trending 213 or higher, slight worsening of his leg swelling left greater than right leg Compliant with torsemide  20 daily, sometimes taking extra torsemide  for weight over 213 pounds  weight 215 at home today Caretaker reports weight running over 213 "a lot"  Reports that he has a chair where he can raise his legs as he sits for much of the day High water intake  Denies chest pain concerning for angina  Lab work reviewed A1C coming down 8.9 down 7.8  EKG personally reviewed by myself on todays visit EKG Interpretation Date/Time:  Friday Mar 22 2024 11:19:10 EDT Ventricular Rate:  60 PR Interval:    QRS Duration:  126 QT Interval:  440 QTC Calculation: 440 R Axis:   -45  Text Interpretation: Atrial fibrillation Left axis deviation Left bundle branch block When compared with ECG of 02-Jun-2023 10:41, Left bundle branch block is now Present Criteria for Septal infarct are no longer Present Confirmed by Joseph [name] [phone]) on 03/22/2024 11:30:24 AM   Other past medical history reviewed  in the hospital May 2024 for acute stroke, weakness MRI in the emergency room confirming acute infarcts right posterior limb of the internal capsule, noted to have mild left sided facial droop  EKG showing atrial fibrillation Was concerned about starting anticoagulation given prior history of nosebleeds, previously seen by ENT  Case previously discussed with Dr. Marven [name], felt not to be a candidate for Watchman device secondary to age  Labs 04/20/23 CR 1.2  Continues on torsemide  20 mg daily Weight at home 208 to 213  Trace lower extremity edema left leg  Followed by Joseph [name], Joseph [name]  loss of his wife 2022  Past medical history reviewed seen in [facility] emergency room Apr 18, 2022 Increased SOB,  Blood pressure markedly elevated 190/70 Troponin 22, BNP 308 Was in normal sinus rhythm Ended up leaving AMA with follow-up in our office  Other past medical history reviewed -cath 12/17/2019  Severe native CAD, including 80% distal LMCA, 100% proximal LAD and diffuse LCx/OM disease.  90% mid RCA stenosis and CTO of rPDA also present. Patent LIMA->D3 with backfilling of the mid/distal LADl. Patent SVG-D1-D2, SVG-OM1-OM2, and SVG-rPDA-rPL. Mildly to moderately elevated LVEDP. At least moderately reduced left ventricular systolic function.  Echo 12/17/2019  1. Left ventricular ejection fraction, by visual estimation, is 50 to  55%. The left ventricle has low normal function. There is  mildly increased  left ventricular hypertrophy.  Other past medical history reviewed  headache 02/03/2017,  Seen in th ER for H/A 02/03/17 Confused,  In the emergency room had CT head, Showing atherosclerotic calcification of the cavernous carotid arteries bilaterally. Lab work within normal limits No other focal neurologic deficits Patient and family thinks he had a TIA  admission May 2017 for acute cholecystitis,  developing acute respiratory distress with hypoxia and acute renal disease, peak creatinine of 2. Notes indicating HIDA scan positive for acute cholecystitis. s/p cholecystostomy tube placed in through interventional radiology 03/24/16 Diuretic was discontinued in the setting of acute renal failure He does report greater than 20 pound weight loss   Echocardiogram 03/22/2016 reviewed with him, ejection fraction 50-55% Chest x-ray 03/29/2016 with small right pleural effusion   Ultrasound of the legs showing patent right SFA stent, left side CFA and popliteal artery with good flow, waveform deterioration around the ankle on the left   Last Cardiac catheterization was performed at [facility] 08/16/2012 Catheterization showed severe three-vessel coronary artery disease with patent grafts x4. There was 80% disease of a small to moderate sized distal left circumflex. Ejection fraction 45-50%. No significant aortic valve stenosis or mitral valve regurgitation. The findings were discussed with interventional cardiology and medical management was recommended for the distal left circumflex disease.   PMH:   has a past medical history of Acute respiratory failure with hypoxia (HCC) (03/21/2016), Atrial fibrillation (HCC) (03/22/2023), CAD (coronary artery disease), Chronic diastolic CHF (congestive heart failure) (HCC), Diabetes mellitus, type 2 (HCC), HTN (hypertension), Hyperlipidemia, Hypothyroidism, Lower extremity edema, Spinal stenosis, and Stroke (HCC).  PSH:    Past Surgical History:  Procedure Laterality Date   APPENDECTOMY     CARDIAC CATHETERIZATION  07/2012   [facility]/no stents   CHOLECYSTECTOMY     CORNEAL TRANSPLANT     CORONARY ARTERY BYPASS GRAFT  1994   HEMORRHOID SURGERY     LEFT HEART CATH AND CORS/GRAFTS ANGIOGRAPHY N/A 12/17/2019   Procedure: LEFT HEART CATH AND CORS/GRAFTS ANGIOGRAPHY;  Surgeon: Joseph Crisp, MD;  Location: [facility] INVASIVE CV LAB;  Service: Cardiovascular;  Laterality: N/A;    PROSTATE BIOPSY     TOE SURGERY     ingrown toe nail   Current Outpatient Medications on File Prior to Visit  Medication Sig Dispense Refill   apixaban  (ELIQUIS ) 2.5 MG TABS tablet Take 2.5 mg by mouth 2 (two) times daily.     atorvastatin  (LIPITOR) 80 MG tablet Take 1 tablet (80 mg total) by mouth daily. 30 tablet 0   Cholecalciferol  50 MCG (2000 UT) CAPS Take 5,000 Units by mouth.     fenofibrate  54 MG tablet Take 1 tablet (54 mg total) by mouth daily. 30 tablet 0   gabapentin  (NEURONTIN ) 100 MG capsule Take 300 mg by mouth at bedtime.     [facility]) OINT Apply 1 application  topically daily as needed. Apply to peri area topically every shift for skin protectant     insulin  aspart (NOVOLOG ) 100 UNIT/ML injection Inject 20 units once daily in the afternoon. Inject 25 units once daily in the eveing. Inject 25 units once daily. 10 mL 0   insulin  glargine (LANTUS ) 100 UNIT/ML injection Inject 0.4 mLs (40 Units total) into the skin daily. 10 mL 0   KLOR-CON  M10 10 MEQ tablet Take 20 mEq by mouth daily.     levothyroxine  (SYNTHROID ) 50 MCG tablet Take 1 tablet (50 mcg total) by mouth daily before breakfast. 30 tablet  0   losartan  (COZAAR ) 100 MG tablet Take 1 tablet (100 mg total) by mouth daily. 90 tablet 3   nitroGLYCERIN  (NITROSTAT ) 0.4 MG SL tablet Place 1 tablet (0.4 mg total) under the tongue every 5 (five) minutes as needed for chest pain. 25 tablet 0   omeprazole  (PRILOSEC) 20 MG capsule Take 1 capsule (20 mg total) by mouth daily. 30 capsule 0   oxymetazoline  (AFRIN) 0.05 % nasal spray Place 2 sprays into both nostrils once for nose bleed, seek medical attention if bleeding persists after 2 sprays, do not repeat dose 6 mL 0   potassium chloride  (KLOR-CON ) 10 MEQ tablet Take 1 tablet (10 mEq total) by mouth daily. 30 tablet 0   rOPINIRole  (REQUIP ) 0.5 MG tablet Take 1 tablet (0.5 mg total) by mouth 2 (two) times daily. 60 tablet 0   sodium chloride  (MURO 128) 5 %  ophthalmic ointment SMARTSIG:sparingly Right Eye Every Night     torsemide  (DEMADEX ) 20 MG tablet Take 1 tablet (20 mg total) by mouth daily. Take an extra tablet ( 20 mg) once daily after lunch for weight 213 lb or higher. 180 tablet 3   Zinc Oxide (TRIPLE PASTE) 12.8 % ointment Apply 1 Application topically as needed for irritation. Every shift.     No current facility-administered medications on file prior to visit.     Allergies:   Farxiga  [dapagliflozin ]   Social History:  The patient  reports that he quit smoking about 49 years ago. His smoking use included pipe, cigars, and cigarettes. He has never used smokeless tobacco. He reports that he does not drink alcohol and does not use drugs.   Family History:   family history includes Heart attack in his father; Heart failure in his maternal grandfather, maternal uncle, and mother; Kidney Stones in his son; Sleep apnea in his daughter and son; Stroke in his brother.    Review of Systems: Review of Systems  Constitutional: Negative.        Gait instability  HENT: Negative.    Respiratory: Negative.    Cardiovascular: Negative.   Gastrointestinal: Negative.   Musculoskeletal: Negative.   Neurological: Negative.   Psychiatric/Behavioral: Negative.    All other systems reviewed and are negative.  PHYSICAL EXAM: VS:  BP (!) 144/78 (BP Location: Left Arm, Patient Position: Sitting, Cuff Size: Large)   Pulse 60   Ht 5\' 7"  (1.702 m)   Wt 213 lb (96.6 kg)   SpO2 95%   BMI 33.36 kg/m  , BMI Body mass index is 33.36 kg/m. Constitutional:  oriented to person, place, and time. No distress.  HENT:  Head: Grossly normal Eyes:  no discharge. No scleral icterus.  Neck: No JVD, no carotid bruits  Cardiovascular: Regular rate and rhythm, no murmurs appreciated Pulmonary/Chest: Clear to auscultation bilaterally, no wheezes or rales Abdominal: Soft.  no distension.  no tenderness.  Musculoskeletal: Normal range of motion Neurological:   normal muscle tone. Coordination normal. No atrophy Skin: Skin warm and dry Psychiatric: normal affect, pleasant  Recent Labs: 04/06/2023: ALT 15; B Natriuretic Peptide 97.9; TSH 1.962 04/11/2023: Magnesium  1.9 07/09/2023: BUN 21; Creatinine, Ser 1.41; Hemoglobin 13.4; Platelets 255; Potassium 4.4; Sodium 143    Lipid Panel Lab Results  Component Value Date   CHOL 126 04/07/2023   HDL 28 (L) 04/07/2023   LDLCALC 70 04/07/2023   TRIG 138 04/07/2023      Wt Readings from Last 3 Encounters:  03/22/24 213 lb (96.6 kg)  07/09/23 209 lb (  94.8 kg)  06/02/23 208 lb 8 oz (94.6 kg)     ASSESSMENT AND PLAN:   Persistent atrial fibrillation History of stroke x 2 January and May 2024 Rate well-controlled  chronic nosebleeds Not a candidate for watchman based on age Tolerating Eliquis  2.5 twice daily  Ischemic cardiomyopathy Prior catheterization 2001 with severe disease, medical management recommended Previously declined cardiac catheterization Tolerating losartan  and torsemide  Not on beta-blocker secondary to bradycardia Unable to afford Entresto, Farxiga  Previously stopped spironolactone   Mixed hyperlipidemia -  Cholesterol is at goal on the current lipid regimen. No changes to the medications were made.  HYPERTENSION, BENIGN -  Recommend close monitoring of blood pressure at home If blood pressure continues to run high could restart spironolactone   Atrial tachycardia Beta-blocker held for AV block Remains in atrial fibrillation rate well-controlled  Atherosclerosis of coronary artery bypass graft of native heart with stable angina pectoris [facility]) -  Catheterization 2021 detailing patent grafts declined repeat catheterization  Diabetes mellitus with peripheral vascular disease (HCC) -  A1c 8.9 down to 7.8 Followed by endocrinology  Diastolic CHF, acute on chronic (HCC) -  Regular recommended he continue torsemide  20 daily with extra torsemide  for weight over 213  pounds Potassium 20 daily  Morbid obesity due to excess calories (HCC) -  Calorie restriction recommended  Leg edema Left greater than right, chronic issue, vein donor leg Continue torsemide  daily with extra as needed Water intake.  Does not like wearing compression hose Stable renal function  PAD (peripheral artery disease) (HCC) -  Minimal disease carotid in 2018 Cholesterol at goal     Orders Placed This Encounter  Procedures   EKG 12-Lead     Signed, Juanda Noon, M.D., Ph.D. 03/22/2024  [facility] [place], [place] [phone]

## 2024-03-22 NOTE — Telephone Encounter (Signed)
 Pt's daughter is requesting a callback regarding her wanting to make sure that these 3 things are mentioned at pt's appt today. Weight gain, fluid around legs and ankles and increased labored breathing. She stated she can't be at the appt today but his caregiver didn't want to bring up these topics today and upset the pt. Please advise.

## 2024-03-22 NOTE — Patient Instructions (Addendum)
 Medication Instructions:  Continue torsemide  20 mg daily Take extra torsemide  20 mg (total of 40 mg) for weight 213 pounds or higher  If you need a refill on your cardiac medications before your next appointment, please call your pharmacy.   Lab work: No new labs needed  Testing/Procedures: No new testing needed  Follow-Up: At [facility], you and your health needs are our priority.  As part of our continuing mission to provide you with exceptional heart care, we have created designated Provider Care Teams.  These Care Teams include your primary Cardiologist (physician) and Advanced Practice Providers (APPs -  Physician Assistants and Nurse Practitioners) who all work together to provide you with the care you need, when you need it.  You will need a follow up appointment in 6 months, APP ok  Providers on your designated Care Team:   Laneta Pintos, NP Varney Gentleman, PA-C Cadence Gennaro Khat, [place]  COVID-19 Vaccine Information can be found at: [email or web address] For questions related to vaccine distribution or appointments, please email [email or web address] or call [phone].

## 2024-03-25 ENCOUNTER — Ambulatory Visit: Payer: PPO

## 2024-03-27 ENCOUNTER — Ambulatory Visit: Payer: PPO

## 2024-03-28 ENCOUNTER — Ambulatory Visit

## 2024-04-01 ENCOUNTER — Ambulatory Visit: Payer: PPO

## 2024-04-01 ENCOUNTER — Ambulatory Visit: Admitting: Physical Therapy

## 2024-04-02 ENCOUNTER — Ambulatory Visit

## 2024-04-03 ENCOUNTER — Ambulatory Visit: Payer: PPO

## 2024-04-03 ENCOUNTER — Ambulatory Visit

## 2024-04-04 ENCOUNTER — Ambulatory Visit: Admitting: Physical Therapy

## 2024-04-08 ENCOUNTER — Ambulatory Visit: Payer: PPO

## 2024-04-08 ENCOUNTER — Ambulatory Visit

## 2024-04-10 ENCOUNTER — Ambulatory Visit: Payer: PPO

## 2024-04-10 ENCOUNTER — Ambulatory Visit

## 2024-04-10 NOTE — Therapy (Incomplete)
 OUTPATIENT PHYSICAL THERAPY NEURO TREATMENT   Patient Name: Joseph [name] MRN: [id number] DOB:[birth year], 89 y.o., male Today's Date: 04/10/2024   PCP: Dr. Gustavo Leigh REFERRING PROVIDER: Lonne Roan, PA  END OF SESSION:           Past Medical History:  Diagnosis Date   Acute respiratory failure with hypoxia (HCC) 03/21/2016   Atrial fibrillation (HCC) 03/22/2023   CAD (coronary artery disease)    a. s/p CABG;  b. 07/2012 Cath: 3VD including 80 dLCX (med Rx), 4/4 patent grafts.   Chronic diastolic CHF (congestive heart failure) (HCC)    a. 07/2012 Echo: EF 55-60%, no rwma, Gr 1 DD, mild MR;  04/2014 Echo: EF 55-60%, no rwma, mild MR, mildly dil LA.   Diabetes mellitus, type 2 (HCC)    HTN (hypertension)    Hyperlipidemia    Hypothyroidism    Lower extremity edema    a. 03/2014 amlodipine  d/c'd.   Spinal stenosis    Stroke (HCC)    1/24 and 5/24   Past Surgical History:  Procedure Laterality Date   APPENDECTOMY     CARDIAC CATHETERIZATION  07/2012   [facility]/no stents   CHOLECYSTECTOMY     CORNEAL TRANSPLANT     CORONARY ARTERY BYPASS GRAFT  1994   HEMORRHOID SURGERY     LEFT HEART CATH AND CORS/GRAFTS ANGIOGRAPHY N/A 12/17/2019   Procedure: LEFT HEART CATH AND CORS/GRAFTS ANGIOGRAPHY;  Surgeon: Sammy Crisp, MD;  Location: [facility] INVASIVE CV LAB;  Service: Cardiovascular;  Laterality: N/A;   PROSTATE BIOPSY     TOE SURGERY     ingrown toe nail   Patient Active Problem List   Diagnosis Date Noted   Chronic anticoagulation 04/23/2023   Stroke (HCC) 04/06/2023   Type II diabetes mellitus with renal manifestations (HCC) 04/06/2023   Chronic kidney disease, stage 3a (HCC) 04/06/2023   COPD (chronic obstructive pulmonary disease) (HCC) 04/06/2023   Chronic diastolic CHF (congestive heart failure) (HCC) 04/06/2023   Permanent atrial fibrillation (HCC) 04/06/2023   Obesity (BMI 30-39.9) 04/06/2023   Acute cerebrovascular accident (CVA) (HCC) 12/09/2022    Chronic combined systolic and diastolic CHF (congestive heart failure) (HCC) 12/09/2022   Fall at home, initial encounter 12/09/2022   Hypokalemia 12/09/2022   Hemiparesis affecting right side as late effect of cerebrovascular accident (CVA) (HCC) 12/09/2022   Skin tear of left hand without complication 11/16/2022   Pain of left hip 11/16/2022   Left hip pain 11/10/2022   Intertrigo 11/10/2022   COPD with acute exacerbation (HCC) 10/06/2022   Diabetes mellitus (HCC) 06/10/2022   Enlarged prostate 03/02/2022   Other specified counseling 03/02/2022   Renal calculi 03/02/2022   Gastroesophageal reflux disease 03/02/2022   Hypertensive disorder 03/02/2022   Mixed hyperlipidemia 03/02/2022   Hypothyroidism 03/02/2022   Acute non-recurrent frontal sinusitis 01/05/2022   Glands swollen 01/05/2022   Hypertension associated with diabetes (HCC) 01/05/2022   Acute rhinitis 01/05/2022   AV block, Mobitz 1    NSTEMI (non-ST elevated myocardial infarction) [facility])    ACS (acute coronary syndrome) (HCC) 12/16/2019   Diverticulitis of large intestine without perforation or abscess without bleeding    Pain of upper abdomen    SOB (shortness of breath)    S/P CABG (coronary artery bypass graft)    Back pain, chronic 07/28/2015   Benign fibroma of prostate 07/28/2015   Acid reflux 07/28/2015   Adult hypothyroidism 07/28/2015   Neuropathy 07/28/2015   Arthritis, degenerative 07/28/2015   Psoriasis 07/28/2015  Lumbar spondylosis 05/08/2015   Obesity 09/09/2014   Bilateral leg weakness 09/09/2014   Acute combined systolic and diastolic heart failure (HCC) 05/13/2014   Bilateral leg edema 04/15/2014   Bradycardia 08/13/2013   Edema 06/21/2012   Weakness 09/15/2011   Type 2 diabetes mellitus with diabetic peripheral angiopathy without gangrene, with long-term current use of insulin  (HCC) 09/15/2011   DYSPNEA 06/09/2009   Hyperlipidemia 06/07/2009   Essential hypertension 06/07/2009   Coronary  artery disease 06/07/2009    ONSET DATE: Jan 2024  REFERRING DIAG:  R26.81 (ICD-10-CM) - Gait instability  R26.89 (ICD-10-CM) - Decreased mobility    THERAPY DIAG:  No diagnosis found.  Rationale for Evaluation and Treatment: Rehabilitation  SUBJECTIVE:                                                                                                                                                                                             SUBJECTIVE STATEMENT:   ***  Pt accompanied by: caregiver  PERTINENT HISTORY: From Eval: Patient reports having 2 CVA last year (1 CVA affecting right and 1 affecting left side). Prior to CVA's he reports he was walking normally without an AD. States has been walking with walker ever since.   [facility]- Patient is a 89 y/o veteran presenting with primary c/o difficulty in walking. PMH significant for CVA in Jan 2024 and in May 2024 with some residual R sided weakness.   PAIN:  Are you having pain? No  PRECAUTIONS: Fall  RED FLAGS: None   WEIGHT BEARING RESTRICTIONS: No  FALLS: Has patient fallen in last 6 months? Yes. Number of falls 1  LIVING ENVIRONMENT: Lives with: lives alone and has private  caregivers - everyday  and [facility] caregivers -3x/week - approx 8 hours/day Lives in: House/apartment Stairs: has ramp supplied by [place] Has following equipment at home: Single point cane, Environmental consultant - 2 wheeled, Wheelchair (manual), Shower bench, bed side commode, Grab bars, Ramped entry, and Transport w/c  PLOF: Independent  PATIENT GOALS: I want to walk without walker independently  OBJECTIVE:  Note: Objective measures were completed at Evaluation unless otherwise noted.  DIAGNOSTIC FINDINGS: CLINICAL DATA:  Stroke, follow up   EXAM: CT HEAD WITHOUT CONTRAST   TECHNIQUE: Contiguous axial images were obtained from the base of the skull through the vertex without intravenous contrast.   RADIATION DOSE REDUCTION: This exam  was performed according to the departmental dose-optimization program which includes automated exposure control, adjustment of the mA and/or kV according to patient size and/or use of iterative reconstruction technique.   COMPARISON:  MR Brain 04/06/23, CT head  04/06/23   FINDINGS: Brain: No hemorrhage. No hydrocephalus. There are prominent subarachnoid spaces bilaterally, unchanged. There is sequela of moderate chronic microvascular ischemic change with a chronic infarct in the left corona radiata and an acute infarct in the right basal ganglia, better characterized on recent brain MRI.   Vascular: No hyperdense vessel or unexpected calcification.   Skull: Normal. Negative for fracture or focal lesion.   Sinuses/Orbits: No mastoid or middle ear effusion. Paranasal sinuses are clear. Bilateral lens replacement. Orbits are otherwise unremarkable.   Other: None.   IMPRESSION: 1. No acute intracranial hemorrhage. 2. Acute infarct in the right basal ganglia, better characterized on recent brain MRI.     Electronically Signed   By: Clora Dane M.D.   On: 04/07/2023 16:08  COGNITION: Overall cognitive status: Within functional limits for tasks assessed   SENSATION: WFL  COORDINATION: WNL  EDEMA:  Some edema noted with lower left LE    POSTURE: rounded shoulders and forward head  LOWER EXTREMITY ROM:     Active  Right Eval Left Eval  Hip flexion    Hip extension    Hip abduction    Hip adduction    Hip internal rotation    Hip external rotation    Knee flexion    Knee extension    Ankle dorsiflexion    Ankle plantarflexion    Ankle inversion    Ankle eversion     (Blank rows = not tested)  LOWER EXTREMITY MMT:    MMT Right Eval Left Eval  Hip flexion 4 4  Hip extension    Hip abduction    Hip adduction    Hip internal rotation 4 4  Hip external rotation 4 4  Knee flexion 4 4  Knee extension 4 4  Ankle dorsiflexion 4 4  Ankle plantarflexion     Ankle inversion    Ankle eversion    (Blank rows = not tested)  BED MOBILITY:  NT but both patient and caregiver report no difficulty with bed mobility  TRANSFERS: Assistive device utilized: Environmental consultant - 2 wheeled  Sit to stand: CGA Stand to sit: CGA Chair to chair: CGA Floor: not tested  GAIT: Gait pattern: decreased step length- Right and decreased step length- Left Distance walked: > 100 feet  Assistive device utilized: Walker - 2 wheeled Level of assistance: CGA Comments: anterior trunk lean  FUNCTIONAL TESTS:  5 times sit to stand: 22.32 sec without UE support Timed up and go (TUG): 27.94 sec  6 minute walk test: To be assessed visit # 2 10 meter walk test: 16.10 and 14.92 sec with RW=15.51 sec avg or 0.65 m/s using RW  Berg Balance Scale: To be assessed visit #2  PATIENT SURVEYS:  Stroke Impact Scale 52 ABC scale 37.5 %  TREATMENT DATE: 04/10/24   Unless otherwise stated, CGA/min A was provided and gait belt donned for pt safety throughout session.  Physical therapy treatment session today consisted of completing assessment of goals and administration of testing as demonstrated and documented in flow sheet, treatment, and goals section of this note. Addition treatments may be found below.   BERG: ABC:   PATIENT EDUCATION: Education details: exercise technique Person educated: Patient,  Education method: Explanation Education comprehension: verbalized understanding  HOME EXERCISE PROGRAM:  Access Code: [email or web address] URL: [email or web address] Date: 01/25/2024 Prepared by: Carlen Chasten  Exercises - Sit to Stand with Counter Support  - 1 x daily - 7 x weekly - 2 sets - 10 reps - Narrow Stance with Counter Support  - 1 x daily - 7 x weekly - 2 sets - 30 seconds hold - Narrow Stance with Head Nods and Counter Support  - 1 x  daily - 7 x weekly - 2 sets - 10 reps   GOALS: Goals reviewed with patient? Yes  SHORT TERM GOALS: Target date: 02/29/2024  Pt will be independent with HEP in order to improve strength and balance in order to decrease fall risk and improve function at home and work.  Baseline: EVAL: no formal HEP in place 4/8: Completing 5 days per week Goal status: MET   LONG TERM GOALS: Target date: 06/13/2024  1.  Patient (> 29 years old) will complete five times sit to stand test in < 15 seconds indicating an increased LE strength and improved balance. Baseline: EVAL: 22.32 sec without UE support 02/27/24: 22.81 sec no UE support; 03/21/2024= 16.17 sec without UE Support Goal status: PROGRESSING  2.  Pt will improve ABC by at least 13% in order to demonstrate clinically significant improvement in balance confidence Baseline: EVAL: 37.5%; 03/21/2024= will reassess next visit Goal status: INITIAL   3.  Patient will increase Berg Balance score by > 6 points to demonstrate decreased fall risk during functional activities. Baseline: 01/25/2024: 25/56 02/27/24: 31; 03/21/2024- Will reassess next visit. Goal status: INITIAL   4.   Patient will reduce timed up and go to <11 seconds to reduce fall risk and demonstrate improved transfer/gait ability. Baseline: EVAL: 27.94 with RW 4/8: 35.58 sec with RW; 03/21/2024= 26.88 sec Goal status: PROGRESSING  5.   Patient will increase 10 meter walk test to >1.85m/s as to improve gait speed for better community ambulation and to reduce fall risk. Baseline: EVAL: 0.65 m/s 02/27/24: .43 m/s with RW; 03/21/2024= 0.71 m/s using RW Goal status: PROGRESSING   6.   Patient will increase six minute walk test distance to >1000 for progression to community ambulator and improve gait ability Baseline: EVAL: To be assessed  visit #2 4/8: 600 ft; 03/21/2024= 600 feet in 5 min using RW- stopped due to fatigue. Goal status: PROGRESSING  7.  Patient will improve score on Stroke Impact Scale 16  by 8 points to signify a higher level of functioning and less difficulty with daily activities.  Baseline: EVAL: 52 02/27/24:55; 03/21/2024=64 Goal status: PROGRESSING   ASSESSMENT:  CLINICAL IMPRESSION:   ***  Pt will continue to benefit from skilled physical therapy intervention to address impairments, improve QOL, and attain therapy goals.    OBJECTIVE IMPAIRMENTS: Abnormal gait, decreased activity tolerance, decreased balance, decreased endurance, decreased mobility, and decreased strength.   ACTIVITY LIMITATIONS: carrying, lifting, bending, standing, squatting, and stairs  PARTICIPATION LIMITATIONS: cleaning, laundry, driving, shopping, community activity, and yard work  PERSONAL FACTORS: Age and 3+  comorbidities: HTN, CHF, A-FIB, DM are also affecting patient's functional outcome.   REHAB POTENTIAL: Good  CLINICAL DECISION MAKING: Evolving/moderate complexity  EVALUATION COMPLEXITY: Moderate  PLAN:  PT FREQUENCY: 1-2x/week  PT DURATION: 12 weeks  PLANNED INTERVENTIONS: 97164- PT Re-evaluation, 97110-Therapeutic exercises, 97530- Therapeutic activity, [id number]- Neuromuscular re-education, 97535- Self Care, 16109- Manual therapy, [id number]- Gait training, [phone]- Canalith repositioning, [phone]- Electrical stimulation (manual), Patient/Family education, Balance training, Stair training, Dry Needling, Joint mobilization, Vestibular training, DME instructions, Cryotherapy, and Moist heat  PLAN FOR NEXT SESSION:  - review  HEP with education  - balance interventions including: narrow BOS, reaching/turning/rotating, eyes closed, and alternating foot taps - gait training without AD, encourage AD use also means independence ( very reluctant to AD use).  - implement therex/theract/gait training Continue plan -BERG reassessment and ABC scale next visit - reassess these 2 goals.   Mitch Arquette  Brain Cahill PT  Physical Therapist- [place]  [facility]   2:38 PM 04/10/24

## 2024-04-11 ENCOUNTER — Ambulatory Visit

## 2024-04-17 ENCOUNTER — Ambulatory Visit

## 2024-04-17 ENCOUNTER — Ambulatory Visit: Payer: PPO

## 2024-04-18 ENCOUNTER — Ambulatory Visit

## 2024-04-22 ENCOUNTER — Ambulatory Visit

## 2024-04-22 ENCOUNTER — Ambulatory Visit: Payer: PPO

## 2024-04-24 ENCOUNTER — Ambulatory Visit

## 2024-04-24 ENCOUNTER — Ambulatory Visit: Payer: PPO

## 2024-04-25 ENCOUNTER — Ambulatory Visit: Admitting: Physical Therapy

## 2024-04-25 ENCOUNTER — Telehealth: Payer: Self-pay | Admitting: Cardiovascular Disease

## 2024-04-25 NOTE — Telephone Encounter (Signed)
 Pt c/o medication issue:  1. Name of Medication: apixaban  (ELIQUIS ) 2.5 MG TABS tablet   2. How are you currently taking this medication (dosage and times per day)? As written   3. Are you having a reaction (difficulty breathing--STAT)? No   4. What is your medication issue? Pts [facility]AT doctor would like the pt to stop this medication for 5 days and once the nose packing is removed the pt could start again. Please advise

## 2024-04-25 NOTE — Telephone Encounter (Signed)
 Called patient to go over need for surgical clearance to be off eliquis  for 5 days. Asked patient what they need to take him off eliquis  for 5 days for. Patient was not sure what they were doing, and thought the eliquis  was a fluid pill and I advised him it was a blood thinner and prevents strokes and heart attacks. I told the patient that I was going to call his ENT dr tomorrow to ask what the plan is for the patient and ask what clearance they need and will route to the pre op clearance team if needed.

## 2024-04-26 NOTE — Telephone Encounter (Signed)
 If epistaxis is not severe with significant ongoing blood loss, I agree with continuing apixaban  2.5 mg BID, which is already subtherapeutic dosing based on age, weight, and creatinine.  If epistaxis worsens, apixaban  will need to be held.  Per Dr. Adolphus Hoops most recent note, left atrial appendage occlusion is not an option given the patient's advanced age and comorbidities.  Sammy Crisp, MD [facility]

## 2024-04-26 NOTE — Telephone Encounter (Signed)
 [place] RN, Paola Bohr called back after speaking with doctor at office. He recommended patient stay on eliquis  and not stop it, as having a nose bleed is better than having another stroke. Patient will still go on 6/10 to have packing put into his nose to help with the nose bleeds. RN at [facility] will call patient regarding this and tell him to stay on the eliquis  and we will follow up with patient as scheduled.

## 2024-04-26 NOTE — Telephone Encounter (Signed)
 Called [facility] for further information on patient procedure. Nurse, Paola Bohr, stated that Dr Silvestre Drum was out of the office for the day doing surgery and his nurse was with him. She stated that he is scheduled to have his nose packed from recurrent nose bleeds and they wanted to take him off eliquis  for 5 days to trial if this is the issue regarding his recurrent nose bleeds. I told the nurse since the patient has persistent atrial fibrillation and a recent CVA in 2024, this may not be a safe option for the patient. Also noted that the patients platelet count was 269 on last result, which is normal range, so not sure what the recurrent nose bleeds may be from unless patient has allergies resulting in dry membranes. Either way, will forward this information to our pre-op and anti-coag clinic for further review and recommendations.

## 2024-04-29 ENCOUNTER — Ambulatory Visit: Payer: PPO

## 2024-04-29 ENCOUNTER — Ambulatory Visit

## 2024-05-01 ENCOUNTER — Ambulatory Visit

## 2024-05-01 ENCOUNTER — Ambulatory Visit: Payer: PPO

## 2024-05-02 ENCOUNTER — Ambulatory Visit

## 2024-05-06 ENCOUNTER — Ambulatory Visit

## 2024-05-06 ENCOUNTER — Ambulatory Visit: Payer: PPO

## 2024-05-07 ENCOUNTER — Ambulatory Visit: Admitting: Physical Therapy

## 2024-05-08 ENCOUNTER — Ambulatory Visit

## 2024-05-08 ENCOUNTER — Ambulatory Visit: Payer: PPO

## 2024-05-09 ENCOUNTER — Ambulatory Visit: Attending: Physician Assistant | Admitting: Physical Therapy

## 2024-05-09 DIAGNOSIS — R2689 Other abnormalities of gait and mobility: Secondary | ICD-10-CM | POA: Diagnosis present

## 2024-05-09 DIAGNOSIS — M6281 Muscle weakness (generalized): Secondary | ICD-10-CM | POA: Diagnosis present

## 2024-05-09 DIAGNOSIS — R269 Unspecified abnormalities of gait and mobility: Secondary | ICD-10-CM | POA: Diagnosis present

## 2024-05-09 DIAGNOSIS — I69398 Other sequelae of cerebral infarction: Secondary | ICD-10-CM | POA: Diagnosis present

## 2024-05-09 DIAGNOSIS — R262 Difficulty in walking, not elsewhere classified: Secondary | ICD-10-CM | POA: Insufficient documentation

## 2024-05-09 DIAGNOSIS — R278 Other lack of coordination: Secondary | ICD-10-CM | POA: Diagnosis present

## 2024-05-09 DIAGNOSIS — R2681 Unsteadiness on feet: Secondary | ICD-10-CM | POA: Diagnosis present

## 2024-05-09 NOTE — Therapy (Signed)
 OUTPATIENT PHYSICAL THERAPY NEURO TREATMENT  Patient Name: Joseph [name] MRN: [id number] DOB:10/20/1925, 89 y.o., male Today's Date: 05/10/2024   PCP: Dr. Charlie Forte REFERRING PROVIDER: Gustavo Igo, PA  END OF SESSION:    PT End of Session - 05/09/24 1402     Visit Number 16    Number of Visits 39    Date for PT Re-Evaluation 06/13/24    Progress Note Due on Visit 20    PT Start Time 1404    PT Stop Time 1444    PT Time Calculation (min) 40 min    Equipment Utilized During Treatment Gait belt    Activity Tolerance Patient tolerated treatment well    Behavior During Therapy WFL for tasks assessed/performed               Past Medical History:  Diagnosis Date   Acute respiratory failure with hypoxia (HCC) 03/21/2016   Atrial fibrillation (HCC) 03/22/2023   CAD (coronary artery disease)    a. s/p CABG;  b. 07/2012 Cath: 3VD including 80 dLCX (med Rx), 4/4 patent grafts.   Chronic diastolic CHF (congestive heart failure) (HCC)    a. 07/2012 Echo: EF 55-60%, no rwma, Gr 1 DD, mild MR;  04/2014 Echo: EF 55-60%, no rwma, mild MR, mildly dil LA.   Diabetes mellitus, type 2 (HCC)    HTN (hypertension)    Hyperlipidemia    Hypothyroidism    Lower extremity edema    a. 03/2014 amlodipine  d/c'd.   Spinal stenosis    Stroke (HCC)    1/24 and 5/24   Past Surgical History:  Procedure Laterality Date   APPENDECTOMY     CARDIAC CATHETERIZATION  07/2012   [facility]/no stents   CHOLECYSTECTOMY     CORNEAL TRANSPLANT     CORONARY ARTERY BYPASS GRAFT  1994   HEMORRHOID SURGERY     LEFT HEART CATH AND CORS/GRAFTS ANGIOGRAPHY N/A 12/17/2019   Procedure: LEFT HEART CATH AND CORS/GRAFTS ANGIOGRAPHY;  Surgeon: Mady Bruckner, MD;  Location: [facility] INVASIVE CV [facility];  Service: Cardiovascular;  Laterality: N/A;   PROSTATE BIOPSY     TOE SURGERY     ingrown toe nail   Patient Active Problem List   Diagnosis Date Noted   Chronic anticoagulation 04/23/2023   Stroke (HCC) 04/06/2023    Type II diabetes mellitus with renal manifestations (HCC) 04/06/2023   Chronic kidney disease, stage 3a (HCC) 04/06/2023   COPD (chronic obstructive pulmonary disease) (HCC) 04/06/2023   Chronic diastolic CHF (congestive heart failure) (HCC) 04/06/2023   Permanent atrial fibrillation (HCC) 04/06/2023   Obesity (BMI 30-39.9) 04/06/2023   Acute cerebrovascular accident (CVA) (HCC) 12/09/2022   Chronic combined systolic and diastolic CHF (congestive heart failure) (HCC) 12/09/2022   Fall at home, initial encounter 12/09/2022   Hypokalemia 12/09/2022   Hemiparesis affecting right side as late effect of cerebrovascular accident (CVA) (HCC) 12/09/2022   Skin tear of left hand without complication 11/16/2022   Pain of left hip 11/16/2022   Left hip pain 11/10/2022   Intertrigo 11/10/2022   COPD with acute exacerbation (HCC) 10/06/2022   Diabetes mellitus (HCC) 06/10/2022   Enlarged prostate 03/02/2022   Other specified counseling 03/02/2022   Renal calculi 03/02/2022   Gastroesophageal reflux disease 03/02/2022   Hypertensive disorder 03/02/2022   Mixed hyperlipidemia 03/02/2022   Hypothyroidism 03/02/2022   Acute non-recurrent frontal sinusitis 01/05/2022   Glands swollen 01/05/2022   Hypertension associated with diabetes (HCC) 01/05/2022   Acute rhinitis 01/05/2022   AV  block, Mobitz 1    NSTEMI (non-ST elevated myocardial infarction) (HCC)    ACS (acute coronary syndrome) (HCC) 12/16/2019   Diverticulitis of large intestine without perforation or abscess without bleeding    Pain of upper abdomen    SOB (shortness of breath)    S/P CABG (coronary artery bypass graft)    Back pain, chronic 07/28/2015   Benign fibroma of prostate 07/28/2015   Acid reflux 07/28/2015   Adult hypothyroidism 07/28/2015   Neuropathy 07/28/2015   Arthritis, degenerative 07/28/2015   Psoriasis 07/28/2015   Lumbar spondylosis 05/08/2015   Obesity 09/09/2014   Bilateral leg weakness 09/09/2014   Acute  combined systolic and diastolic heart failure (HCC) 05/13/2014   Bilateral leg edema 04/15/2014   Bradycardia 08/13/2013   Edema 06/21/2012   Weakness 09/15/2011   Type 2 diabetes mellitus with diabetic peripheral angiopathy without gangrene, with long-term current use of insulin  (HCC) 09/15/2011   DYSPNEA 06/09/2009   Hyperlipidemia 06/07/2009   Essential hypertension 06/07/2009   Coronary artery disease 06/07/2009    ONSET DATE: Jan 2024  REFERRING DIAG:  R26.81 (ICD-10-CM) - Gait instability  R26.89 (ICD-10-CM) - Decreased mobility    THERAPY DIAG:  Difficulty in walking, not elsewhere classified  Abnormality of gait  Unsteadiness on feet  Difficulty walking  Muscle weakness (generalized)  Other lack of coordination  Imbalance due to old stroke  Rationale for Evaluation and Treatment: Rehabilitation  SUBJECTIVE:                                                                                                                                                                                             SUBJECTIVE STATEMENT:    Pt returns to PT after > 1 month break awaiting insurance approval. Pt has been approved for 15 additional visits. Reports that he wants to walk without RW, but understands that he needs it right now for safety.   Pt accompanied by: caregiver  PERTINENT HISTORY: From Eval: Patient reports having 2 CVA last year (1 CVA affecting right and 1 affecting left side). Prior to CVA's he reports he was walking normally without an AD. States has been walking with walker ever since.   [facility]2024- Patient is a 89 y/o veteran presenting with primary c/o difficulty in walking. PMH significant for CVA in Jan 2024 and in May 2024 with some residual R sided weakness.   PAIN:  Are you having pain? No  PRECAUTIONS: Fall  RED FLAGS: None   WEIGHT BEARING RESTRICTIONS: No  FALLS: Has patient fallen in last 6 months? Yes. Number of falls  1  LIVING  ENVIRONMENT: Lives with: lives alone and has private  caregivers - everyday  and [facility] caregivers -3x/week - approx 8 hours/day Lives in: House/apartment Stairs: has ramp supplied by [employer] Has following equipment at home: Single point cane, Environmental consultant - 2 wheeled, Wheelchair (manual), Shower bench, bed side commode, Grab bars, Ramped entry, and Transport w/c  PLOF: Independent  PATIENT GOALS: I want to walk without walker independently  OBJECTIVE:  Note: Objective measures were completed at Evaluation unless otherwise noted.  DIAGNOSTIC FINDINGS: CLINICAL DATA:  Stroke, follow up   EXAM: CT HEAD WITHOUT CONTRAST   TECHNIQUE: Contiguous axial images were obtained from the base of the skull through the vertex without intravenous contrast.   RADIATION DOSE REDUCTION: This exam was performed according to the departmental dose-optimization program which includes automated exposure control, adjustment of the mA and/or kV according to patient size and/or use of iterative reconstruction technique.   COMPARISON:  MR Brain 04/06/23, CT head 04/06/23   FINDINGS: Brain: No hemorrhage. No hydrocephalus. There are prominent subarachnoid spaces bilaterally, unchanged. There is sequela of moderate chronic microvascular ischemic change with a chronic infarct in the left corona radiata and an acute infarct in the right basal ganglia, better characterized on recent brain MRI.   Vascular: No hyperdense vessel or unexpected calcification.   Skull: Normal. Negative for fracture or focal lesion.   Sinuses/Orbits: No mastoid or middle ear effusion. Paranasal sinuses are clear. Bilateral lens replacement. Orbits are otherwise unremarkable.   Other: None.   IMPRESSION: 1. No acute intracranial hemorrhage. 2. Acute infarct in the right basal ganglia, better characterized on recent brain MRI.     Electronically Signed   By: Lyndall Gore M.D.   On: 04/07/2023 16:08  COGNITION: Overall  cognitive status: Within functional limits for tasks assessed   SENSATION: WFL  COORDINATION: WNL  EDEMA:  Some edema noted with lower left LE    POSTURE: rounded shoulders and forward head  LOWER EXTREMITY ROM:     Active  Right Eval Left Eval  Hip flexion    Hip extension    Hip abduction    Hip adduction    Hip internal rotation    Hip external rotation    Knee flexion    Knee extension    Ankle dorsiflexion    Ankle plantarflexion    Ankle inversion    Ankle eversion     (Blank rows = not tested)  LOWER EXTREMITY MMT:    MMT Right Eval Left Eval  Hip flexion 4 4  Hip extension    Hip abduction    Hip adduction    Hip internal rotation 4 4  Hip external rotation 4 4  Knee flexion 4 4  Knee extension 4 4  Ankle dorsiflexion 4 4  Ankle plantarflexion    Ankle inversion    Ankle eversion    (Blank rows = not tested)  BED MOBILITY:  NT but both patient and caregiver report no difficulty with bed mobility  TRANSFERS: Assistive device utilized: Environmental consultant - 2 wheeled  Sit to stand: CGA Stand to sit: CGA Chair to chair: CGA Floor: not tested  GAIT: Gait pattern: decreased step length- Right and decreased step length- Left Distance walked: > 100 feet  Assistive device utilized: Walker - 2 wheeled Level of assistance: CGA Comments: anterior trunk lean  FUNCTIONAL TESTS:  5 times sit to stand: 22.32 sec without UE support Timed up and go (TUG): 27.94 sec  6 minute walk test: To be assessed  visit # 2 10 meter walk test: 16.10 and 14.92 sec with RW=15.51 sec avg or 0.65 m/s using RW  Berg Balance Scale: To be assessed visit #2  PATIENT SURVEYS:  Stroke Impact Scale 52 ABC scale 37.5 %                                                                                                                              TREATMENT DATE: 05/10/24   Unless otherwise stated, CGA/min A was provided and gait belt donned for pt safety throughout session.  Pt  performed stand pivot transfer to arm chair with CGA. Midle shuffle when performing without UE support .   Sit<>stand 2x 5 no UE support   10 Meter Walk Test: Patient instructed to walk 10 meters (32.8 ft) as quickly and as safely as possible at their normal speed x2 and at a fast speed x2. Time measured from 2 meter mark to 8 meter mark to accommodate ramp-up and ramp-down.   Average Normal speed: 0.37m/s with RW Cut off scores: <0.4 m/s = household Ambulator, 0.4-0.8 m/s = limited community Ambulator, >0.8 m/s = community Ambulator, >1.2 m/s = crossing a street, <1.0 = increased fall risk MCID 0.05 m/s (small), 0.13 m/s (moderate), 0.06 m/s (significant)  (ANPTA Core Set of Outcome Measures for Adults with Neurologic Conditions, 2018)    Patient demonstrates increased fall risk as noted by score of   33/56 on Berg Balance Scale.  (<36= high risk for falls, close to 100%; 37-45 significant >80%; 46-51 moderate >50%; 52-55 lower >25%)  [facility] PT Assessment - 05/10/24 0001       Berg Balance Test   Sit to Stand Able to stand without using hands and stabilize independently    Standing Unsupported Able to stand 2 minutes with supervision    Sitting with Back Unsupported but Feet Supported on Floor or Stool Able to sit safely and securely 2 minutes    Stand to Sit Sits safely with minimal use of hands    Transfers Able to transfer with verbal cueing and /or supervision    Standing Unsupported with Eyes Closed Able to stand 10 seconds with supervision    Standing Unsupported with Feet Together Able to place feet together independently but unable to hold for 30 seconds    From Standing, Reach Forward with Outstretched Arm Can reach forward >5 cm safely (2)    From Standing Position, Pick up Object from Floor Able to pick up shoe, needs supervision    From Standing Position, Turn to Look Behind Over each Shoulder Turn sideways only but maintains balance    Turn 360 Degrees Needs close supervision  or verbal cueing    Standing Unsupported, Alternately Place Feet on Step/Stool Able to complete >2 steps/needs minimal assist    Standing Unsupported, One Foot in Front Able to take small step independently and hold 30 seconds    Standing on One Leg Unable to try  or needs assist to prevent fall          Patient demonstrates increased fall risk as noted by score of   33/56 on Berg Balance Scale.  (<36= high risk for falls, close to 100%; 37-45 significant >80%; 46-51 moderate >50%; 52-55 lower >25%)  Multiple prolonged rest breaks while completing Berg due to BLE fatigue.   Completed ABC:70% - question accuracy compared to prior assessment  PATIENT EDUCATION: Education details: exercise technique Person educated: Patient,  Education method: Explanation Education comprehension: verbalized understanding  HOME EXERCISE PROGRAM:  Access Code: [email or web address] URL: [email or web address] Date: 01/25/2024 Prepared by: Connell Kiss  Exercises - Sit to Stand with Counter Support  - 1 x daily - 7 x weekly - 2 sets - 10 reps - Narrow Stance with Counter Support  - 1 x daily - 7 x weekly - 2 sets - 30 seconds hold - Narrow Stance with Head Nods and Counter Support  - 1 x daily - 7 x weekly - 2 sets - 10 reps   GOALS: Goals reviewed with patient? Yes  SHORT TERM GOALS: Target date: 02/29/2024  Pt will be independent with HEP in order to improve strength and balance in order to decrease fall risk and improve function at home and work.  Baseline: EVAL: no formal HEP in place 4/8: Completing 5 days per week Goal status: MET   LONG TERM GOALS: Target date: 06/13/2024  1.  Patient (> 75 years old) will complete five times sit to stand test in < 15 seconds indicating an increased LE strength and improved balance. Baseline: EVAL: 22.32 sec without UE support 02/27/24: 22.81 sec no UE support; 03/21/2024= 16.17 sec without UE Support Goal status: PROGRESSING  2.  Pt will improve ABC by at  least 13% in order to demonstrate clinically significant improvement in balance confidence Baseline: EVAL: 37.5%; 03/21/2024= will reassess next visit 6/19: 70%, question validity compared to prior assessment.  Goal status: INITIAL   3.  Patient will increase Berg Balance score by > 6 points to demonstrate decreased fall risk during functional activities. Baseline: 01/25/2024: 25/56 02/27/24: 31; 03/21/2024- Will reassess next visit.  Goal status: INITIAL   4.   Patient will reduce timed up and go to <11 seconds to reduce fall risk and demonstrate improved transfer/gait ability. Baseline: EVAL: 27.94 with RW 4/8: 35.58 sec with RW; 03/21/2024= 26.88 sec Goal status: PROGRESSING  5.   Patient will increase 10 meter walk test to >1.80m/s as to improve gait speed for better community ambulation and to reduce fall risk. Baseline: EVAL: 0.65 m/s 02/27/24: .43 m/s with RW; 03/21/2024= 0.71 m/s using RW 6/19: 0.5m/s Goal status: PROGRESSING   6.   Patient will increase six minute walk test distance to >1000 for progression to community ambulator and improve gait ability Baseline: EVAL: To be assessed  visit #2 4/8: 600 ft; 03/21/2024= 600 feet in 5 min using RW- stopped due to fatigue. Goal status: PROGRESSING  7.  Patient will improve score on Stroke Impact Scale 16 by 8 points to signify a higher level of functioning and less difficulty with daily activities.  Baseline: EVAL: 52 02/27/24:55; 03/21/2024=64 Goal status: PROGRESSING   ASSESSMENT:  CLINICAL IMPRESSION:     Patient presents today in good spirits for recert visit. Reassessed as many goals as time allowed today. Decreased gait speed, but improved berg balance scale and improved ABC, but question validity of answers given pt presentation. Will adjust HEP on next PT visit. Pt will  continue to benefit from skilled physical therapy intervention to address impairments, improve QOL, and attain therapy goals.    OBJECTIVE IMPAIRMENTS: Abnormal gait,  decreased activity tolerance, decreased balance, decreased endurance, decreased mobility, and decreased strength.   ACTIVITY LIMITATIONS: carrying, lifting, bending, standing, squatting, and stairs  PARTICIPATION LIMITATIONS: cleaning, laundry, driving, shopping, community activity, and yard work  PERSONAL FACTORS: Age and 3+ comorbidities: HTN, CHF, A-FIB, DM are also affecting patient's functional outcome.   REHAB POTENTIAL: Good  CLINICAL DECISION MAKING: Evolving/moderate complexity  EVALUATION COMPLEXITY: Moderate  PLAN:  PT FREQUENCY: 1-2x/week  PT DURATION: 12 weeks  PLANNED INTERVENTIONS: [id number]- PT Re-evaluation, 97110-Therapeutic exercises, 97530- Therapeutic activity, [id number]- Neuromuscular re-education, 97535- Self Care, [id number]- Manual therapy, [id number]- Gait training, [phone]- Canalith repositioning, [phone]- Electrical stimulation (manual), Patient/Family education, Balance training, Stair training, Dry Needling, Joint mobilization, Vestibular training, DME instructions, Cryotherapy, and Moist heat  PLAN FOR NEXT SESSION:  - review  HEP with education and expand  - balance interventions including: narrow BOS, reaching/turning/rotating, eyes closed, and alternating foot taps - gait training without AD, encourage AD use also means independence ( very reluctant to AD use).  - implement therex/theract/gait training   Massie FORBES Dollar PT  Physical Therapist- [facility]   9:56 AM 05/10/24

## 2024-05-15 NOTE — Therapy (Signed)
 OUTPATIENT PHYSICAL THERAPY NEURO TREATMENT  Patient Name: Joseph [name] MRN: [id number] DOB:11/24/1924, 89 y.o., male Today's Date: 05/16/2024   PCP: Dr. Charlie Forte REFERRING PROVIDER: Gustavo Igo, PA  END OF SESSION:    PT End of Session - 05/16/24 1601     Visit Number 17    Number of Visits 39    Date for PT Re-Evaluation 06/13/24    Progress Note Due on Visit 20    PT Start Time 1615    PT Stop Time 1700    PT Time Calculation (min) 45 min    Equipment Utilized During Treatment Gait belt    Activity Tolerance Patient tolerated treatment well    Behavior During Therapy WFL for tasks assessed/performed                Past Medical History:  Diagnosis Date   Acute respiratory failure with hypoxia (HCC) 03/21/2016   Atrial fibrillation (HCC) 03/22/2023   CAD (coronary artery disease)    a. s/p CABG;  b. 07/2012 Cath: 3VD including 80 dLCX (med Rx), 4/4 patent grafts.   Chronic diastolic CHF (congestive heart failure) (HCC)    a. 07/2012 Echo: EF 55-60%, no rwma, Gr 1 DD, mild MR;  04/2014 Echo: EF 55-60%, no rwma, mild MR, mildly dil LA.   Diabetes mellitus, type 2 (HCC)    HTN (hypertension)    Hyperlipidemia    Hypothyroidism    Lower extremity edema    a. 03/2014 amlodipine  d/c'd.   Spinal stenosis    Stroke (HCC)    1/24 and 5/24   Past Surgical History:  Procedure Laterality Date   APPENDECTOMY     CARDIAC CATHETERIZATION  07/2012   [facility]/no stents   CHOLECYSTECTOMY     CORNEAL TRANSPLANT     CORONARY ARTERY BYPASS GRAFT  1994   HEMORRHOID SURGERY     LEFT HEART CATH AND CORS/GRAFTS ANGIOGRAPHY N/A 12/17/2019   Procedure: LEFT HEART CATH AND CORS/GRAFTS ANGIOGRAPHY;  Surgeon: Mady Bruckner, MD;  Location: [facility] INVASIVE CV [facility];  Service: Cardiovascular;  Laterality: N/A;   PROSTATE BIOPSY     TOE SURGERY     ingrown toe nail   Patient Active Problem List   Diagnosis Date Noted   Chronic anticoagulation 04/23/2023   Stroke (HCC)  04/06/2023   Type II diabetes mellitus with renal manifestations (HCC) 04/06/2023   Chronic kidney disease, stage 3a (HCC) 04/06/2023   COPD (chronic obstructive pulmonary disease) (HCC) 04/06/2023   Chronic diastolic CHF (congestive heart failure) (HCC) 04/06/2023   Permanent atrial fibrillation (HCC) 04/06/2023   Obesity (BMI 30-39.9) 04/06/2023   Acute cerebrovascular accident (CVA) (HCC) 12/09/2022   Chronic combined systolic and diastolic CHF (congestive heart failure) (HCC) 12/09/2022   Fall at home, initial encounter 12/09/2022   Hypokalemia 12/09/2022   Hemiparesis affecting right side as late effect of cerebrovascular accident (CVA) (HCC) 12/09/2022   Skin tear of left hand without complication 11/16/2022   Pain of left hip 11/16/2022   Left hip pain 11/10/2022   Intertrigo 11/10/2022   COPD with acute exacerbation (HCC) 10/06/2022   Diabetes mellitus (HCC) 06/10/2022   Enlarged prostate 03/02/2022   Other specified counseling 03/02/2022   Renal calculi 03/02/2022   Gastroesophageal reflux disease 03/02/2022   Hypertensive disorder 03/02/2022   Mixed hyperlipidemia 03/02/2022   Hypothyroidism 03/02/2022   Acute non-recurrent frontal sinusitis 01/05/2022   Glands swollen 01/05/2022   Hypertension associated with diabetes (HCC) 01/05/2022   Acute rhinitis 01/05/2022  AV block, Mobitz 1    NSTEMI (non-ST elevated myocardial infarction) (HCC)    ACS (acute coronary syndrome) (HCC) 12/16/2019   Diverticulitis of large intestine without perforation or abscess without bleeding    Pain of upper abdomen    SOB (shortness of breath)    S/P CABG (coronary artery bypass graft)    Back pain, chronic 07/28/2015   Benign fibroma of prostate 07/28/2015   Acid reflux 07/28/2015   Adult hypothyroidism 07/28/2015   Neuropathy 07/28/2015   Arthritis, degenerative 07/28/2015   Psoriasis 07/28/2015   Lumbar spondylosis 05/08/2015   Obesity 09/09/2014   Bilateral leg weakness  09/09/2014   Acute combined systolic and diastolic heart failure (HCC) 05/13/2014   Bilateral leg edema 04/15/2014   Bradycardia 08/13/2013   Edema 06/21/2012   Weakness 09/15/2011   Type 2 diabetes mellitus with diabetic peripheral angiopathy without gangrene, with long-term current use of insulin  (HCC) 09/15/2011   DYSPNEA 06/09/2009   Hyperlipidemia 06/07/2009   Essential hypertension 06/07/2009   Coronary artery disease 06/07/2009    ONSET DATE: Jan 2024  REFERRING DIAG:  R26.81 (ICD-10-CM) - Gait instability  R26.89 (ICD-10-CM) - Decreased mobility    THERAPY DIAG:  Difficulty in walking, not elsewhere classified  Abnormality of gait  Unsteadiness on feet  Difficulty walking  Muscle weakness (generalized)  Rationale for Evaluation and Treatment: Rehabilitation  SUBJECTIVE:                                                                                                                                                                                             SUBJECTIVE STATEMENT:    Patient presents with caregiver and daughter.    Pt accompanied by: caregiver  PERTINENT HISTORY: From Eval: Patient reports having 2 CVA last year (1 CVA affecting right and 1 affecting left side). Prior to CVA's he reports he was walking normally without an AD. States has been walking with walker ever since.   [facility]24- Patient is a 89 y/o veteran presenting with primary c/o difficulty in walking. PMH significant for CVA in Jan 2024 and in May 2024 with some residual R sided weakness.   PAIN:  Are you having pain? No  PRECAUTIONS: Fall  RED FLAGS: None   WEIGHT BEARING RESTRICTIONS: No  FALLS: Has patient fallen in last 6 months? Yes. Number of falls 1  LIVING ENVIRONMENT: Lives with: lives alone and has private  caregivers - everyday  and [facility] caregivers -3x/week - approx 8 hours/day Lives in: House/apartment Stairs: has ramp supplied by [employer] Has  following equipment at home: Single point cane, Walker - 2  wheeled, Wheelchair (manual), Shower bench, bed side commode, Grab bars, Ramped entry, and Transport w/c  PLOF: Independent  PATIENT GOALS: I want to walk without walker independently  OBJECTIVE:  Note: Objective measures were completed at Evaluation unless otherwise noted.  DIAGNOSTIC FINDINGS: CLINICAL DATA:  Stroke, follow up   EXAM: CT HEAD WITHOUT CONTRAST   TECHNIQUE: Contiguous axial images were obtained from the base of the skull through the vertex without intravenous contrast.   RADIATION DOSE REDUCTION: This exam was performed according to the departmental dose-optimization program which includes automated exposure control, adjustment of the mA and/or kV according to patient size and/or use of iterative reconstruction technique.   COMPARISON:  MR Brain 04/06/23, CT head 04/06/23   FINDINGS: Brain: No hemorrhage. No hydrocephalus. There are prominent subarachnoid spaces bilaterally, unchanged. There is sequela of moderate chronic microvascular ischemic change with a chronic infarct in the left corona radiata and an acute infarct in the right basal ganglia, better characterized on recent brain MRI.   Vascular: No hyperdense vessel or unexpected calcification.   Skull: Normal. Negative for fracture or focal lesion.   Sinuses/Orbits: No mastoid or middle ear effusion. Paranasal sinuses are clear. Bilateral lens replacement. Orbits are otherwise unremarkable.   Other: None.   IMPRESSION: 1. No acute intracranial hemorrhage. 2. Acute infarct in the right basal ganglia, better characterized on recent brain MRI.     Electronically Signed   By: Lyndall Gore M.D.   On: 04/07/2023 16:08  COGNITION: Overall cognitive status: Within functional limits for tasks assessed   SENSATION: WFL  COORDINATION: WNL  EDEMA:  Some edema noted with lower left LE    POSTURE: rounded shoulders and forward  head  LOWER EXTREMITY ROM:     Active  Right Eval Left Eval  Hip flexion    Hip extension    Hip abduction    Hip adduction    Hip internal rotation    Hip external rotation    Knee flexion    Knee extension    Ankle dorsiflexion    Ankle plantarflexion    Ankle inversion    Ankle eversion     (Blank rows = not tested)  LOWER EXTREMITY MMT:    MMT Right Eval Left Eval  Hip flexion 4 4  Hip extension    Hip abduction    Hip adduction    Hip internal rotation 4 4  Hip external rotation 4 4  Knee flexion 4 4  Knee extension 4 4  Ankle dorsiflexion 4 4  Ankle plantarflexion    Ankle inversion    Ankle eversion    (Blank rows = not tested)  BED MOBILITY:  NT but both patient and caregiver report no difficulty with bed mobility  TRANSFERS: Assistive device utilized: Environmental consultant - 2 wheeled  Sit to stand: CGA Stand to sit: CGA Chair to chair: CGA Floor: not tested  GAIT: Gait pattern: decreased step length- Right and decreased step length- Left Distance walked: > 100 feet  Assistive device utilized: Walker - 2 wheeled Level of assistance: CGA Comments: anterior trunk lean  FUNCTIONAL TESTS:  5 times sit to stand: 22.32 sec without UE support Timed up and go (TUG): 27.94 sec  6 minute walk test: To be assessed visit # 2 10 meter walk test: 16.10 and 14.92 sec with RW=15.51 sec avg or 0.65 m/s using RW  Berg Balance Scale: To be assessed visit #2  PATIENT SURVEYS:  Stroke Impact Scale 52 ABC scale 37.5 %  TREATMENT DATE: 05/16/24    Unless otherwise stated, CGA/min A was provided and gait belt donned for pt safety throughout session.   TA- To improve functional movements patterns for everyday tasks    In // bars: Forward/backwards ambulation 6x length Lateral step 6x length 6 in step: -toe taps 10x each LE   Activity  Description: 3 pods on table 3 pods on floor; hit lit up pod for spatial awareness Activity Setting:  The Blaze Pod Random setting was chosen to enhance cognitive processing and agility, providing an unpredictable environment to simulate real-world scenarios, and fostering quick reactions and adaptability.   Number of Pods:  6 Cycles/Sets:  3 Duration (Time or Hit Count):  15     TE- To improve strength, endurance, mobility, and function of specific targeted muscle groups or improve joint range of motion or improve muscle flexibility      Adduction 10x Adduction with heel raise 10x Adduction with IR/ER; very challenging 10x   HEP progression:  Access Code: [email or web address] URL: [email or web address] Date: 05/15/2024 Prepared by: Raliyah Montella  Exercises - Sit to Stand with Counter Support  - 1 x daily - 7 x weekly - 2 sets - 10 reps - Narrow Stance with Counter Support  - 1 x daily - 7 x weekly - 2 sets - 30 seconds hold - Narrow Stance with Head Nods and Counter Support  - 1 x daily - 7 x weekly - 2 sets - 10 reps - Standing March with Counter Support  - 1 x daily - 7 x weekly - 2 sets - 10 reps - 5 hold - Leg Extension  - 1 x daily - 7 x weekly - 2 sets - 10 reps - 5 hold - Seated Single Leg Hip Abduction with Resistance  - 1 x daily - 7 x weekly - 2 sets - 10 reps - 5 hold  PATIENT EDUCATION: Education details: exercise technique Person educated: Patient,  Education method: Explanation Education comprehension: verbalized understanding  HOME EXERCISE PROGRAM:  Access Code: [email or web address] URL: [email or web address] Date: 01/25/2024 Prepared by: Connell Kiss  Exercises - Sit to Stand with Counter Support  - 1 x daily - 7 x weekly - 2 sets - 10 reps - Narrow Stance with Counter Support  - 1 x daily - 7 x weekly - 2 sets - 30 seconds hold - Narrow Stance with Head Nods and Counter Support  - 1 x daily - 7 x weekly - 2 sets - 10 reps  Access Code:  [email or web address] URL: [email or web address] Date: 05/15/2024 Prepared by: Carlie Solorzano  Exercises - Sit to Stand with Counter Support  - 1 x daily - 7 x weekly - 2 sets - 10 reps - Narrow Stance with Counter Support  - 1 x daily - 7 x weekly - 2 sets - 30 seconds hold - Narrow Stance with Head Nods and Counter Support  - 1 x daily - 7 x weekly - 2 sets - 10 reps - Standing March with Counter Support  - 1 x daily - 7 x weekly - 2 sets - 10 reps - 5 hold - Leg Extension  - 1 x daily - 7 x weekly - 2 sets - 10 reps - 5 hold - Seated Single Leg Hip Abduction with Resistance  - 1 x daily - 7 x weekly - 2 sets - 10 reps - 5 hold  GOALS: Goals reviewed with patient? Yes  SHORT TERM GOALS: Target date: 02/29/2024  Pt will be  independent with HEP in order to improve strength and balance in order to decrease fall risk and improve function at home and work.  Baseline: EVAL: no formal HEP in place 4/8: Completing 5 days per week Goal status: MET   LONG TERM GOALS: Target date: 06/13/2024  1.  Patient (> 44 years old) will complete five times sit to stand test in < 15 seconds indicating an increased LE strength and improved balance. Baseline: EVAL: 22.32 sec without UE support 02/27/24: 22.81 sec no UE support; 03/21/2024= 16.17 sec without UE Support Goal status: PROGRESSING  2.  Pt will improve ABC by at least 13% in order to demonstrate clinically significant improvement in balance confidence Baseline: EVAL: 37.5%; 03/21/2024= will reassess next visit 6/19: 70%, question validity compared to prior assessment.  Goal status: INITIAL   3.  Patient will increase Berg Balance score by > 6 points to demonstrate decreased fall risk during functional activities. Baseline: 01/25/2024: 25/56 02/27/24: 31; 03/21/2024- Will reassess next visit.  Goal status: INITIAL   4.   Patient will reduce timed up and go to <11 seconds to reduce fall risk and demonstrate improved transfer/gait ability. Baseline:  EVAL: 27.94 with RW 4/8: 35.58 sec with RW; 03/21/2024= 26.88 sec Goal status: PROGRESSING  5.   Patient will increase 10 meter walk test to >1.12m/s as to improve gait speed for better community ambulation and to reduce fall risk. Baseline: EVAL: 0.65 m/s 02/27/24: .43 m/s with RW; 03/21/2024= 0.71 m/s using RW 6/19: 0.92m/s Goal status: PROGRESSING   6.   Patient will increase six minute walk test distance to >1000 for progression to community ambulator and improve gait ability Baseline: EVAL: To be assessed  visit #2 4/8: 600 ft; 03/21/2024= 600 feet in 5 min using RW- stopped due to fatigue. Goal status: PROGRESSING  7.  Patient will improve score on Stroke Impact Scale 16 by 8 points to signify a higher level of functioning and less difficulty with daily activities.  Baseline: EVAL: 52 02/27/24:55; 03/21/2024=64 Goal status: PROGRESSING   ASSESSMENT:  CLINICAL IMPRESSION:     Patient presents with excellent motivation. He is fatigued in standing and has increased RLE drag with fatigue. Lateral and posterior ambulation in // bars is very challenging and fatiguing to patient. Coordination and visual scanning with blaze pods was fun and interactive to patient with patient being very engaged and eager to participate.   Pt will continue to benefit from skilled physical therapy intervention to address impairments, improve QOL, and attain therapy goals.    OBJECTIVE IMPAIRMENTS: Abnormal gait, decreased activity tolerance, decreased balance, decreased endurance, decreased mobility, and decreased strength.   ACTIVITY LIMITATIONS: carrying, lifting, bending, standing, squatting, and stairs  PARTICIPATION LIMITATIONS: cleaning, laundry, driving, shopping, community activity, and yard work  PERSONAL FACTORS: Age and 3+ comorbidities: HTN, CHF, A-FIB, DM are also affecting patient's functional outcome.   REHAB POTENTIAL: Good  CLINICAL DECISION MAKING: Evolving/moderate complexity  EVALUATION  COMPLEXITY: Moderate  PLAN:  PT FREQUENCY: 1-2x/week  PT DURATION: 12 weeks  PLANNED INTERVENTIONS: [id number]- PT Re-evaluation, 97110-Therapeutic exercises, 97530- Therapeutic activity, [id number]- Neuromuscular re-education, 97535- Self Care, [id number]- Manual therapy, [id number]- Gait training, [phone]- Canalith repositioning, [phone]- Electrical stimulation (manual), Patient/Family education, Balance training, Stair training, Dry Needling, Joint mobilization, Vestibular training, DME instructions, Cryotherapy, and Moist heat  PLAN FOR NEXT SESSION:  - review  HEP with education and expand  - balance interventions including: narrow BOS, reaching/turning/rotating, eyes closed, and alternating foot taps - gait training without AD,  encourage AD use also means independence ( very reluctant to AD use).  - implement therex/theract/gait training   Correne Lalani  Leopoldo PT  Physical Therapist- [facility]   5:04 PM 05/16/24

## 2024-05-16 ENCOUNTER — Ambulatory Visit

## 2024-05-16 DIAGNOSIS — R2681 Unsteadiness on feet: Secondary | ICD-10-CM

## 2024-05-16 DIAGNOSIS — M6281 Muscle weakness (generalized): Secondary | ICD-10-CM

## 2024-05-16 DIAGNOSIS — R269 Unspecified abnormalities of gait and mobility: Secondary | ICD-10-CM

## 2024-05-16 DIAGNOSIS — R262 Difficulty in walking, not elsewhere classified: Secondary | ICD-10-CM | POA: Diagnosis not present

## 2024-05-20 ENCOUNTER — Ambulatory Visit

## 2024-05-22 ENCOUNTER — Ambulatory Visit: Admitting: Physical Therapy

## 2024-05-23 ENCOUNTER — Ambulatory Visit: Attending: Physician Assistant

## 2024-05-23 DIAGNOSIS — R2689 Other abnormalities of gait and mobility: Secondary | ICD-10-CM | POA: Insufficient documentation

## 2024-05-23 DIAGNOSIS — R262 Difficulty in walking, not elsewhere classified: Secondary | ICD-10-CM | POA: Insufficient documentation

## 2024-05-23 DIAGNOSIS — I69398 Other sequelae of cerebral infarction: Secondary | ICD-10-CM | POA: Insufficient documentation

## 2024-05-23 DIAGNOSIS — R278 Other lack of coordination: Secondary | ICD-10-CM | POA: Insufficient documentation

## 2024-05-23 DIAGNOSIS — R2681 Unsteadiness on feet: Secondary | ICD-10-CM | POA: Insufficient documentation

## 2024-05-23 DIAGNOSIS — M6281 Muscle weakness (generalized): Secondary | ICD-10-CM | POA: Insufficient documentation

## 2024-05-23 DIAGNOSIS — R269 Unspecified abnormalities of gait and mobility: Secondary | ICD-10-CM | POA: Insufficient documentation

## 2024-05-23 NOTE — Therapy (Signed)
 OUTPATIENT PHYSICAL THERAPY TREATMENT  Patient Name: Joseph [name] MRN: [id number] DOB:Oct 02, 1925, 89 y.o., male Today's Date: 05/23/2024  PCP: Dr. Charlie Forte REFERRING PROVIDER: Gustavo Igo, PA  END OF SESSION:    PT End of Session - 05/23/24 1629     Visit Number 18    Number of Visits 39    Date for PT Re-Evaluation 06/13/24    Progress Note Due on Visit 20    PT Start Time 1620    PT Stop Time 1700    PT Time Calculation (min) 40 min    Equipment Utilized During Treatment Gait belt    Activity Tolerance Patient tolerated treatment well;No increased pain    Behavior During Therapy [facility] for tasks assessed/performed          Past Medical History:  Diagnosis Date   Acute respiratory failure with hypoxia (HCC) 03/21/2016   Atrial fibrillation (HCC) 03/22/2023   CAD (coronary artery disease)    a. s/p CABG;  b. 07/2012 Cath: 3VD including 80 dLCX (med Rx), 4/4 patent grafts.   Chronic diastolic CHF (congestive heart failure) (HCC)    a. 07/2012 Echo: EF 55-60%, no rwma, Gr 1 DD, mild MR;  04/2014 Echo: EF 55-60%, no rwma, mild MR, mildly dil LA.   Diabetes mellitus, type 2 (HCC)    HTN (hypertension)    Hyperlipidemia    Hypothyroidism    Lower extremity edema    a. 03/2014 amlodipine  d/c'd.   Spinal stenosis    Stroke (HCC)    1/24 and 5/24   Past Surgical History:  Procedure Laterality Date   APPENDECTOMY     CARDIAC CATHETERIZATION  07/2012   [facility]/no stents   CHOLECYSTECTOMY     CORNEAL TRANSPLANT     CORONARY ARTERY BYPASS GRAFT  1994   HEMORRHOID SURGERY     LEFT HEART CATH AND CORS/GRAFTS ANGIOGRAPHY N/A 12/17/2019   Procedure: LEFT HEART CATH AND CORS/GRAFTS ANGIOGRAPHY;  Surgeon: Mady Bruckner, MD;  Location: [facility] INVASIVE CV LAB;  Service: Cardiovascular;  Laterality: N/A;   PROSTATE BIOPSY     TOE SURGERY     ingrown toe nail   Patient Active Problem List   Diagnosis Date Noted   Chronic anticoagulation 04/23/2023   Stroke (HCC) 04/06/2023    Type II diabetes mellitus with renal manifestations (HCC) 04/06/2023   Chronic kidney disease, stage 3a (HCC) 04/06/2023   COPD (chronic obstructive pulmonary disease) (HCC) 04/06/2023   Chronic diastolic CHF (congestive heart failure) (HCC) 04/06/2023   Permanent atrial fibrillation (HCC) 04/06/2023   Obesity (BMI 30-39.9) 04/06/2023   Acute cerebrovascular accident (CVA) (HCC) 12/09/2022   Chronic combined systolic and diastolic CHF (congestive heart failure) (HCC) 12/09/2022   Fall at home, initial encounter 12/09/2022   Hypokalemia 12/09/2022   Hemiparesis affecting right side as late effect of cerebrovascular accident (CVA) (HCC) 12/09/2022   Skin tear of left hand without complication 11/16/2022   Pain of left hip 11/16/2022   Left hip pain 11/10/2022   Intertrigo 11/10/2022   COPD with acute exacerbation (HCC) 10/06/2022   Diabetes mellitus (HCC) 06/10/2022   Enlarged prostate 03/02/2022   Other specified counseling 03/02/2022   Renal calculi 03/02/2022   Gastroesophageal reflux disease 03/02/2022   Hypertensive disorder 03/02/2022   Mixed hyperlipidemia 03/02/2022   Hypothyroidism 03/02/2022   Acute non-recurrent frontal sinusitis 01/05/2022   Glands swollen 01/05/2022   Hypertension associated with diabetes (HCC) 01/05/2022   Acute rhinitis 01/05/2022   AV block, Mobitz 1  NSTEMI (non-ST elevated myocardial infarction) [facility])    ACS (acute coronary syndrome) (HCC) 12/16/2019   Diverticulitis of large intestine without perforation or abscess without bleeding    Pain of upper abdomen    SOB (shortness of breath)    S/P CABG (coronary artery bypass graft)    Back pain, chronic 07/28/2015   Benign fibroma of prostate 07/28/2015   Acid reflux 07/28/2015   Adult hypothyroidism 07/28/2015   Neuropathy 07/28/2015   Arthritis, degenerative 07/28/2015   Psoriasis 07/28/2015   Lumbar spondylosis 05/08/2015   Obesity 09/09/2014   Bilateral leg weakness 09/09/2014   Acute  combined systolic and diastolic heart failure (HCC) 05/13/2014   Bilateral leg edema 04/15/2014   Bradycardia 08/13/2013   Edema 06/21/2012   Weakness 09/15/2011   Type 2 diabetes mellitus with diabetic peripheral angiopathy without gangrene, with long-term current use of insulin  (HCC) 09/15/2011   DYSPNEA 06/09/2009   Hyperlipidemia 06/07/2009   Essential hypertension 06/07/2009   Coronary artery disease 06/07/2009   ONSET DATE: Jan 2024  REFERRING DIAG:  R26.81 (ICD-10-CM) - Gait instability  R26.89 (ICD-10-CM) - Decreased mobility    THERAPY DIAG:  Difficulty in walking, not elsewhere classified  Abnormality of gait  Unsteadiness on feet  Difficulty walking  Muscle weakness (generalized)  Rationale for Evaluation and Treatment: Rehabilitation  SUBJECTIVE:                                                                                                                                                                                          SUBJECTIVE STATEMENT:   Pt doing well today. No updates since last session. Pt reports he has been working on new exercises without any trouble.   Pt accompanied by: caregiver SUSAN  PERTINENT HISTORY: Patient reports having 2 CVA last year (1 CVA affecting right and 1 affecting left side). Prior to CVA he reports he was walking normally without an AD. States has been walking with walker ever since.   PAIN:  Are you having pain? No PRECAUTIONS: Fall WEIGHT BEARING RESTRICTIONS: No FALLS: Has patient fallen in last 6 months? Yes. Number of falls 1  LIVING ENVIRONMENT: Lives with: lives alone and has private  caregivers - everyday  and [facility] caregivers -3x/week - approx 8 hours/day Lives in: House/apartment Stairs: has ramp supplied by [employer] Has following equipment at home: Single point cane, Environmental consultant - 2 wheeled, Wheelchair (manual), Shower bench, bed side commode, Grab bars, Ramped entry, and Transport w/c  PLOF: Independent  PATIENT  GOALS: I want to walk without walker independently  OBJECTIVE:  Note: Objective measures were completed at Evaluation unless otherwise noted.  DIAGNOSTIC FINDINGS:  CLINICAL DATA:  Stroke, follow up   EXAM: CT HEAD WITHOUT CONTRAST   TECHNIQUE: Contiguous axial images were obtained from the base of the skull through the vertex without intravenous contrast.   RADIATION DOSE REDUCTION: This exam was performed according to the departmental dose-optimization program which includes automated exposure control, adjustment of the mA and/or kV according to patient size and/or use of iterative reconstruction technique.   COMPARISON:  MR Brain 04/06/23, CT head 04/06/23   FINDINGS: Brain: No hemorrhage. No hydrocephalus. There are prominent subarachnoid spaces bilaterally, unchanged. There is sequela of moderate chronic microvascular ischemic change with a chronic infarct in the left corona radiata and an acute infarct in the right basal ganglia, better characterized on recent brain MRI.   Vascular: No hyperdense vessel or unexpected calcification.   Skull: Normal. Negative for fracture or focal lesion.   Sinuses/Orbits: No mastoid or middle ear effusion. Paranasal sinuses are clear. Bilateral lens replacement. Orbits are otherwise unremarkable.   Other: None.   IMPRESSION: 1. No acute intracranial hemorrhage. 2. Acute infarct in the right basal ganglia, better characterized on recent brain MRI    Electronically Signed   By: Lyndall Gore M.D.   On: 04/07/2023 16:08  COGNITION: Overall cognitive status: Within functional limits for tasks assessed EDEMA:  Some edema noted with lower left LE  LOWER EXTREMITY MMT:    MMT Right Eval Left Eval  Hip flexion 4 4  Hip extension    Hip abduction    Hip adduction    Hip internal rotation 4 4  Hip external rotation 4 4  Knee flexion 4 4  Knee extension 4 4  Ankle dorsiflexion 4 4  Ankle plantarflexion    Ankle inversion     Ankle eversion    (Blank rows = not tested)  TRANSFERS: Assistive device utilized: Environmental consultant - 2 wheeled  Sit to stand: CGA Stand to sit: CGA Chair to chair: CGA Floor: not tested  GAIT: Gait pattern: decreased step length- Right and decreased step length- Left Distance walked: > 100 feet  Assistive device utilized: Walker - 2 wheeled Level of assistance: CGA Comments: anterior trunk lean  FUNCTIONAL TESTS:  5 times sit to stand: 22.32 sec without UE support Timed up and go (TUG): 27.94 sec  6 minute walk test: To be assessed visit # 2 10 meter walk test: 16.10 and 14.92 sec with RW=15.51 sec avg or 0.65 m/s using RW  Berg Balance Scale: To be assessed visit #2  PATIENT SURVEYS:  Stroke Impact Scale 52 ABC scale 37.5 %                                                                                                                             TREATMENT DATE: 05/23/24  -AMB over-ground 175' c RW, minGuardA with yellow gait belt  -STS from chair hands free x10 -AMB over-ground 175' c RW -STS from chair hands free x10 + blue med ball  -  normal stance firm surface blue ball chest press x10 (shoulder pain), then horizontal head turns x16 -lateral stepping in // bars 3x bilat,  3rd lap is quite limited by legs, begins to appear weak, have some buckling, need for hand support.  -sugar monitor starts alarming, reads at 69 mg/dL, Devere gives surgary food from the emergency grab bag.  -pt AMB hands free forward in // bars twice, minGuard A     PATIENT EDUCATION: Education details: exercise technique Person educated: Patient,  Education method: Explanation Education comprehension: verbalized understanding  HOME EXERCISE PROGRAM:  Access Code: [email or web address] URL: [email or web address] Date: 01/25/2024 Prepared by: Connell Kiss  Exercises - Sit to Stand with Counter Support  - 1 x daily - 7 x weekly - 2 sets - 10 reps - Narrow Stance with Counter Support  - 1 x daily - 7  x weekly - 2 sets - 30 seconds hold - Narrow Stance with Head Nods and Counter Support  - 1 x daily - 7 x weekly - 2 sets - 10 reps  Access Code: [email or web address] URL: [email or web address] Date: 05/15/2024 Prepared by: Marina  [name]  Exercises - Sit to Stand with Counter Support  - 1 x daily - 7 x weekly - 2 sets - 10 reps - Narrow Stance with Counter Support  - 1 x daily - 7 x weekly - 2 sets - 30 seconds hold - Narrow Stance with Head Nods and Counter Support  - 1 x daily - 7 x weekly - 2 sets - 10 reps - Standing March with Counter Support  - 1 x daily - 7 x weekly - 2 sets - 10 reps - 5 hold - Leg Extension  - 1 x daily - 7 x weekly - 2 sets - 10 reps - 5 hold - Seated Single Leg Hip Abduction with Resistance  - 1 x daily - 7 x weekly - 2 sets - 10 reps - 5 hold  GOALS: Goals reviewed with patient? Yes  SHORT TERM GOALS: Target date: 02/29/2024 Pt will be independent with HEP in order to improve strength and balance in order to decrease fall risk and improve function at home and work.  Baseline: EVAL: no formal HEP in place 4/8: Completing 5 days per week Goal status: MET  LONG TERM GOALS: Target date: 06/13/2024  1.  Patient (> 69 years old) will complete five times sit to stand test in < 15 seconds indicating an increased LE strength and improved balance. Baseline: EVAL: 22.32 sec without UE support 02/27/24: 22.81 sec no UE support; 03/21/2024= 16.17 sec without UE Support Goal status: PROGRESSING  2.  Pt will improve ABC by at least 13% in order to demonstrate clinically significant improvement in balance confidence Baseline: EVAL: 37.5%; 03/21/2024= will reassess next visit 6/19: 70%, question validity compared to prior assessment.  Goal status: INITIAL   3.  Patient will increase Berg Balance score by > 6 points to demonstrate decreased fall risk during functional activities. Baseline: 01/25/2024: 25/56 02/27/24: 31; 03/21/2024- Will reassess next visit. Goal status: INITIAL    4.  Patient will reduce timed up and go to <11 seconds to reduce fall risk and demonstrate improved transfer/gait ability. Baseline: EVAL: 27.94 with RW 4/8: 35.58 sec with RW; 03/21/2024= 26.88 sec Goal status: PROGRESSING  5.  Patient will increase 10 meter walk test to >1.52m/s as to improve gait speed for better community ambulation and to reduce fall risk. Baseline: EVAL: 0.65 m/s 02/27/24: .43 m/s with RW; 03/21/2024= 0.71  m/s using RW 6/19: 0.56m/s Goal status: PROGRESSING   6.  Patient will increase six minute walk test distance to >1000 for progression to community ambulator and improve gait ability Baseline: EVAL: To be assessed  visit #2 4/8: 600 ft; 03/21/2024= 600 feet in 5 min using RW- stopped due to fatigue. Goal status: PROGRESSING  7.  Patient will improve score on Stroke Impact Scale 16 by 8 points to signify a higher level of functioning and less difficulty with daily activities.  Baseline: EVAL: 52 02/27/24:55; 03/21/2024=64 Goal status: PROGRESSING   ASSESSMENT:  CLINICAL IMPRESSION:    Continued to work on progress toward LT goals, particularly walking interventions which remain the most limited still. Pt is far more limited in AMB tolerance than he is by the need for DME for gait. Pt has easily provoked DOE in session, many recovery intervals requires ~2 minutes. CBG alarm notes slight hypoglycemia in final 3 minutes of session, aide provides surgary snacks. Pt will continue to benefit from skilled physical therapy intervention to address impairments, improve QOL, and attain therapy goals.    OBJECTIVE IMPAIRMENTS: Abnormal gait, decreased activity tolerance, decreased balance, decreased endurance, decreased mobility, and decreased strength.   ACTIVITY LIMITATIONS: carrying, lifting, bending, standing, squatting, and stairs  PARTICIPATION LIMITATIONS: cleaning, laundry, driving, shopping, community activity, and yard work  PERSONAL FACTORS: Age and 3+ comorbidities: HTN,  CHF, A-FIB, DM are also affecting patient's functional outcome.   REHAB POTENTIAL: Good  CLINICAL DECISION MAKING: Evolving/moderate complexity  EVALUATION COMPLEXITY: Moderate  PLAN:  PT FREQUENCY: 1-2x/week  PT DURATION: 12 weeks  PLANNED INTERVENTIONS: [id number]- PT Re-evaluation, 97110-Therapeutic exercises, 97530- Therapeutic activity, 97112- Neuromuscular re-education, 97535- Self Care, [id number]- Manual therapy, [phone]- Gait training, [phone]- Canalith repositioning, [phone]- Electrical stimulation (manual), Patient/Family education, Balance training, Stair training, Dry Needling, Joint mobilization, Vestibular training, DME instructions, Cryotherapy, and Moist heat  PLAN FOR NEXT SESSION:  - balance interventions including: narrow BOS, reaching/turning/rotating, eyes closed, and alternating foot taps - gait training without AD, encourage AD use also means independence (very reluctant to AD use)   4:30 PM, 05/23/24 Peggye JAYSON Linear, PT, DPT Physical Therapist - [facility]  [phone] [facility])

## 2024-05-27 ENCOUNTER — Ambulatory Visit: Admitting: Physical Therapy

## 2024-05-30 ENCOUNTER — Ambulatory Visit: Admitting: Physical Therapy

## 2024-06-03 ENCOUNTER — Ambulatory Visit

## 2024-06-04 ENCOUNTER — Ambulatory Visit: Admitting: Physical Therapy

## 2024-06-04 DIAGNOSIS — I69398 Other sequelae of cerebral infarction: Secondary | ICD-10-CM

## 2024-06-04 DIAGNOSIS — R2681 Unsteadiness on feet: Secondary | ICD-10-CM

## 2024-06-04 DIAGNOSIS — M6281 Muscle weakness (generalized): Secondary | ICD-10-CM

## 2024-06-04 DIAGNOSIS — R262 Difficulty in walking, not elsewhere classified: Secondary | ICD-10-CM

## 2024-06-04 DIAGNOSIS — R269 Unspecified abnormalities of gait and mobility: Secondary | ICD-10-CM

## 2024-06-04 DIAGNOSIS — R278 Other lack of coordination: Secondary | ICD-10-CM

## 2024-06-04 NOTE — Therapy (Signed)
 OUTPATIENT PHYSICAL THERAPY TREATMENT  Patient Name: Joseph [name] MRN: [id number] DOB:11/09/1925, 89 y.o., male Today's Date: 06/04/2024  PCP: Dr. Charlie Forte REFERRING PROVIDER: Gustavo Igo, PA  END OF SESSION:    PT End of Session - 06/04/24 1146     Visit Number 19    Number of Visits 39    Date for PT Re-Evaluation 06/13/24    Authorization Type [facility] approval [id number] for 15 more visits (06/04/2024 is 4 of 15)    Progress Note Due on Visit 20    PT Start Time 1147    PT Stop Time 1230    PT Time Calculation (min) 43 min    Equipment Utilized During Treatment Gait belt    Activity Tolerance Patient tolerated treatment well;No increased pain    Behavior During Therapy [facility] for tasks assessed/performed           Past Medical History:  Diagnosis Date   Acute respiratory failure with hypoxia (HCC) 03/21/2016   Atrial fibrillation (HCC) 03/22/2023   CAD (coronary artery disease)    a. s/p CABG;  b. 07/2012 Cath: 3VD including 80 dLCX (med Rx), 4/4 patent grafts.   Chronic diastolic CHF (congestive heart failure) (HCC)    a. 07/2012 Echo: EF 55-60%, no rwma, Gr 1 DD, mild MR;  04/2014 Echo: EF 55-60%, no rwma, mild MR, mildly dil LA.   Diabetes mellitus, type 2 (HCC)    HTN (hypertension)    Hyperlipidemia    Hypothyroidism    Lower extremity edema    a. 03/2014 amlodipine  d/c'd.   Spinal stenosis    Stroke (HCC)    1/24 and 5/24   Past Surgical History:  Procedure Laterality Date   APPENDECTOMY     CARDIAC CATHETERIZATION  07/2012   [facility]/no stents   CHOLECYSTECTOMY     CORNEAL TRANSPLANT     CORONARY ARTERY BYPASS GRAFT  1994   HEMORRHOID SURGERY     LEFT HEART CATH AND CORS/GRAFTS ANGIOGRAPHY N/A 12/17/2019   Procedure: LEFT HEART CATH AND CORS/GRAFTS ANGIOGRAPHY;  Surgeon: Mady Bruckner, MD;  Location: [facility] INVASIVE CV LAB;  Service: Cardiovascular;  Laterality: N/A;   PROSTATE BIOPSY     TOE SURGERY     ingrown toe nail   Patient Active Problem  List   Diagnosis Date Noted   Chronic anticoagulation 04/23/2023   Stroke (HCC) 04/06/2023   Type II diabetes mellitus with renal manifestations (HCC) 04/06/2023   Chronic kidney disease, stage 3a (HCC) 04/06/2023   COPD (chronic obstructive pulmonary disease) (HCC) 04/06/2023   Chronic diastolic CHF (congestive heart failure) (HCC) 04/06/2023   Permanent atrial fibrillation (HCC) 04/06/2023   Obesity (BMI 30-39.9) 04/06/2023   Acute cerebrovascular accident (CVA) (HCC) 12/09/2022   Chronic combined systolic and diastolic CHF (congestive heart failure) (HCC) 12/09/2022   Fall at home, initial encounter 12/09/2022   Hypokalemia 12/09/2022   Hemiparesis affecting right side as late effect of cerebrovascular accident (CVA) (HCC) 12/09/2022   Skin tear of left hand without complication 11/16/2022   Pain of left hip 11/16/2022   Left hip pain 11/10/2022   Intertrigo 11/10/2022   COPD with acute exacerbation (HCC) 10/06/2022   Diabetes mellitus (HCC) 06/10/2022   Enlarged prostate 03/02/2022   Other specified counseling 03/02/2022   Renal calculi 03/02/2022   Gastroesophageal reflux disease 03/02/2022   Hypertensive disorder 03/02/2022   Mixed hyperlipidemia 03/02/2022   Hypothyroidism 03/02/2022   Acute non-recurrent frontal sinusitis 01/05/2022   Glands swollen 01/05/2022   Hypertension  associated with diabetes (HCC) 01/05/2022   Acute rhinitis 01/05/2022   AV block, Mobitz 1    NSTEMI (non-ST elevated myocardial infarction) [facility])    ACS (acute coronary syndrome) (HCC) 12/16/2019   Diverticulitis of large intestine without perforation or abscess without bleeding    Pain of upper abdomen    SOB (shortness of breath)    S/P CABG (coronary artery bypass graft)    Back pain, chronic 07/28/2015   Benign fibroma of prostate 07/28/2015   Acid reflux 07/28/2015   Adult hypothyroidism 07/28/2015   Neuropathy 07/28/2015   Arthritis, degenerative 07/28/2015   Psoriasis 07/28/2015    Lumbar spondylosis 05/08/2015   Obesity 09/09/2014   Bilateral leg weakness 09/09/2014   Acute combined systolic and diastolic heart failure (HCC) 05/13/2014   Bilateral leg edema 04/15/2014   Bradycardia 08/13/2013   Edema 06/21/2012   Weakness 09/15/2011   Type 2 diabetes mellitus with diabetic peripheral angiopathy without gangrene, with long-term current use of insulin  (HCC) 09/15/2011   DYSPNEA 06/09/2009   Hyperlipidemia 06/07/2009   Essential hypertension 06/07/2009   Coronary artery disease 06/07/2009   ONSET DATE: Jan 2024  REFERRING DIAG:  R26.81 (ICD-10-CM) - Gait instability  R26.89 (ICD-10-CM) - Decreased mobility    THERAPY DIAG:  Difficulty in walking, not elsewhere classified  Abnormality of gait  Unsteadiness on feet  Difficulty walking  Muscle weakness (generalized)  Other lack of coordination  Imbalance due to old stroke  Rationale for Evaluation and Treatment: Rehabilitation  SUBJECTIVE:                                                                                                                                                                                          SUBJECTIVE STATEMENT:    Pt reports he has been doing alright. Pt clearly reports his goal is to be able to walk by himself without the walker. Denies pain currently. Per chart review, pt had MD apt on 7/10: pt reports having L hip pain at night when sitting on bed and going to lift feet up, but pt gestures the pain is more in his L lower abdomen. Per chart review, urine test negative. Pt states he is taking tylenol  at night when going to bed and that is helping a lot stating I'm not having the pain like I used to be.  Denies stumbles/falls.   Pt reports he is doing his HEP as well as arm exercises using dumbbells.    Pt accompanied by: caregiver SUSAN  PERTINENT HISTORY: Patient reports having 2 CVA last year (1 CVA affecting right and 1 affecting left side). Prior to CVA he  reports  he was walking normally without an AD. States has been walking with walker ever since.   PAIN:  Are you having pain? No PRECAUTIONS: Fall WEIGHT BEARING RESTRICTIONS: No FALLS: Has patient fallen in last 6 months? Yes. Number of falls 1  LIVING ENVIRONMENT: Lives with: lives alone and has private  caregivers - everyday  and [facility] caregivers -3x/week - approx 8 hours/day Lives in: House/apartment Stairs: has ramp supplied by [employer] Has following equipment at home: Single point cane, Environmental consultant - 2 wheeled, Wheelchair (manual), Shower bench, bed side commode, Grab bars, Ramped entry, and Transport w/c  PLOF: Independent  PATIENT GOALS: I want to walk without walker independently  OBJECTIVE:  Note: Objective measures were completed at Evaluation unless otherwise noted.  DIAGNOSTIC FINDINGS: CLINICAL DATA:  Stroke, follow up   EXAM: CT HEAD WITHOUT CONTRAST   TECHNIQUE: Contiguous axial images were obtained from the base of the skull through the vertex without intravenous contrast.   RADIATION DOSE REDUCTION: This exam was performed according to the departmental dose-optimization program which includes automated exposure control, adjustment of the mA and/or kV according to patient size and/or use of iterative reconstruction technique.   COMPARISON:  MR Brain 04/06/23, CT head 04/06/23   FINDINGS: Brain: No hemorrhage. No hydrocephalus. There are prominent subarachnoid spaces bilaterally, unchanged. There is sequela of moderate chronic microvascular ischemic change with a chronic infarct in the left corona radiata and an acute infarct in the right basal ganglia, better characterized on recent brain MRI.   Vascular: No hyperdense vessel or unexpected calcification.   Skull: Normal. Negative for fracture or focal lesion.   Sinuses/Orbits: No mastoid or middle ear effusion. Paranasal sinuses are clear. Bilateral lens replacement. Orbits are otherwise unremarkable.   Other:  None.   IMPRESSION: 1. No acute intracranial hemorrhage. 2. Acute infarct in the right basal ganglia, better characterized on recent brain MRI    Electronically Signed   By: Lyndall Gore M.D.   On: 04/07/2023 16:08  COGNITION: Overall cognitive status: Within functional limits for tasks assessed EDEMA:  Some edema noted with lower left LE  LOWER EXTREMITY MMT:    MMT Right Eval Left Eval  Hip flexion 4 4  Hip extension    Hip abduction    Hip adduction    Hip internal rotation 4 4  Hip external rotation 4 4  Knee flexion 4 4  Knee extension 4 4  Ankle dorsiflexion 4 4  Ankle plantarflexion    Ankle inversion    Ankle eversion    (Blank rows = not tested)  TRANSFERS: Assistive device utilized: Environmental consultant - 2 wheeled  Sit to stand: CGA Stand to sit: CGA Chair to chair: CGA Floor: not tested  GAIT: Gait pattern: decreased step length- Right and decreased step length- Left Distance walked: > 100 feet  Assistive device utilized: Walker - 2 wheeled Level of assistance: CGA Comments: anterior trunk lean  FUNCTIONAL TESTS:  5 times sit to stand: 22.32 sec without UE support Timed up and go (TUG): 27.94 sec  6 minute walk test: To be assessed visit # 2 10 meter walk test: 16.10 and 14.92 sec with RW=15.51 sec avg or 0.65 m/s using RW  Berg Balance Scale: To be assessed visit #2  PATIENT SURVEYS:  Stroke Impact Scale 52 ABC scale 37.5 %  TREATMENT DATE: 06/04/24   Unless otherwise stated, CGA/min A was provided and gait belt donned in order to ensure pt safety throughout session.  R stand pivot transport chair>green chair, no AD, but using armrests for support, with light min A for steadying.    Gait training ~154ft using RW with CGA/light min assist for steadying. Pt demonstrating the following gait deviations with therapist providing the  described cuing and facilitation for improvement:  Pt states he tries not to push on the AD in order to decrease his reliance on it Therapist providing repeated cuing to maintain upright posture and decrease reliance on B UE Notice pt's R LE having occasional jumping or incoordinated movement likely due to potential weakness  Demos decreased gait speed with short step lengths Pt reports feeling SOB at end, vitals HR 87bpm and SpO2 98%   Gait training ~23ft down/back (1 lap + 2 laps x2 + 1 lap -- seated breaks between each), no AD, with light min assist for balance.  Slight balance instability requiring min A for recovery when turning Pt does slowly turn with increased steps to maintain balance  Continues to demo wider BOS to maintain balance with guarded upper body posturing, lacks trunk rotation and arm swing On final round, progressed to performing 1x head turn each direction on verbal command  No significant change in balance, but continued min A provided   Educated pt on not attempting ambulation at home without RW. Educated pt that instead focus on participating in increased gait at home using RW - pt has goal to walk 4-5x/day - pt states his goal would be to do a longer walk around his house each time after he gets up to use the restroom.   Dynamic standing balance interventions including diagonal trunk rotations while pulling against RTB resistance 2x 10reps each direction focusing on visually tracking for increased balance challenge as well as rotating trunk/pelvis to improve balance.   Pt states I'm proud of myself regarding his accomplishments during today's therapy session. Pt able to re-verbalize education from therapist on not walking at home without RW.     PATIENT EDUCATION: Education details: exercise technique Person educated: Patient,  Education method: Explanation Education comprehension: verbalized understanding  HOME EXERCISE PROGRAM:  Access Code:  [email or web address] URL: [email or web address] Date: 01/25/2024 Prepared by: Connell Kiss  Exercises - Sit to Stand with Counter Support  - 1 x daily - 7 x weekly - 2 sets - 10 reps - Narrow Stance with Counter Support  - 1 x daily - 7 x weekly - 2 sets - 30 seconds hold - Narrow Stance with Head Nods and Counter Support  - 1 x daily - 7 x weekly - 2 sets - 10 reps  Access Code: [email or web address] URL: [email or web address] Date: 05/15/2024 Prepared by: Marina  [name]  Exercises - Sit to Stand with Counter Support  - 1 x daily - 7 x weekly - 2 sets - 10 reps - Narrow Stance with Counter Support  - 1 x daily - 7 x weekly - 2 sets - 30 seconds hold - Narrow Stance with Head Nods and Counter Support  - 1 x daily - 7 x weekly - 2 sets - 10 reps - Standing March with Counter Support  - 1 x daily - 7 x weekly - 2 sets - 10 reps - 5 hold - Leg Extension  - 1 x daily - 7 x weekly - 2 sets - 10 reps - 5 hold - Seated Single Leg  Hip Abduction with Resistance  - 1 x daily - 7 x weekly - 2 sets - 10 reps - 5 hold  GOALS: Goals reviewed with patient? Yes  SHORT TERM GOALS: Target date: 02/29/2024  Pt will be independent with HEP in order to improve strength and balance in order to decrease fall risk and improve function at home and work.  Baseline: EVAL: no formal HEP in place 4/8: Completing 5 days per week Goal status: MET  LONG TERM GOALS: Target date: 06/13/2024  1.  Patient (> 44 years old) will complete five times sit to stand test in < 15 seconds indicating an increased LE strength and improved balance. Baseline: EVAL: 22.32 sec without UE support 02/27/24: 22.81 sec no UE support; 03/21/2024= 16.17 sec without UE Support Goal status: PROGRESSING  2.  Pt will improve ABC by at least 13% in order to demonstrate clinically significant improvement in balance confidence Baseline: EVAL: 37.5% 6/19: 70%, question validity compared to prior assessment.  Goal status: IN PROGRESS   3.   Patient will increase Berg Balance score by > 6 points to demonstrate decreased fall risk during functional activities. Baseline: 01/25/24: 25/56  02/27/24: 31 05/09/24: 33/56  Goal status: IN PROGRESS   4.  Patient will reduce timed up and go to <11 seconds to reduce fall risk and demonstrate improved transfer/gait ability. Baseline: EVAL: 27.94 with RW 4/8: 35.58 sec with RW; 03/21/2024= 26.88 sec Goal status: PROGRESSING  5.  Patient will increase 10 meter walk test to >1.70m/s as to improve gait speed for better community ambulation and to reduce fall risk. Baseline: EVAL: 0.65 m/s 02/27/24: .43 m/s with RW; 03/21/2024= 0.71 m/s using RW 6/19: 0.3m/s using RW Goal status: PROGRESSING   6.  Patient will increase six minute walk test distance to >1000 for progression to community ambulator and improve gait ability Baseline: EVAL: To be assessed  visit #2 4/8: 600 ft; 03/21/2024= 600 feet in 5 min using RW- stopped due to fatigue. Goal status: PROGRESSING  7.  Patient will improve score on Stroke Impact Scale 16 by 8 points to signify a higher level of functioning and less difficulty with daily activities.  Baseline: EVAL: 52 02/27/24:55; 03/21/2024=64 Goal status: PROGRESSING   ASSESSMENT:  CLINICAL IMPRESSION:   Therapy session focused on patient's main goal of being able to ambulate without use of AD. Patient participated in multiple reps of short distance ambulation laps with turns at each end for additional dynamic balance challenge. Patient demonstrates slow gait speed with short step lengths and wide BOS along with guarded upper body posturing due to imbalance during gait requiring min A for stability. Pt also participated in dynamic standing balance task focusing on core activation with trunk rotation and visual scanning to improve overall balance stability. Pt will continue to benefit from skilled physical therapy intervention to address impairments, improve QOL, and attain therapy goals.     OBJECTIVE IMPAIRMENTS: Abnormal gait, decreased activity tolerance, decreased balance, decreased endurance, decreased mobility, and decreased strength.   ACTIVITY LIMITATIONS: carrying, lifting, bending, standing, squatting, and stairs  PARTICIPATION LIMITATIONS: cleaning, laundry, driving, shopping, community activity, and yard work  PERSONAL FACTORS: Age and 3+ comorbidities: HTN, CHF, A-FIB, DM are also affecting patient's functional outcome.   REHAB POTENTIAL: Good  CLINICAL DECISION MAKING: Evolving/moderate complexity  EVALUATION COMPLEXITY: Moderate  PLAN:  PT FREQUENCY: 1-2x/week  PT DURATION: 12 weeks  PLANNED INTERVENTIONS: 97164- PT Re-evaluation, 97110-Therapeutic exercises, 97530- Therapeutic activity, [id number]- Neuromuscular re-education, 97535- Self Care, [id number]-  Manual therapy, [id number]- Gait training, 04007- Canalith repositioning, [id number]- Electrical stimulation (manual), Patient/Family education, Balance training, Stair training, Dry Needling, Joint mobilization, Vestibular training, DME instructions, Cryotherapy, and Moist heat  PLAN FOR NEXT SESSION:  *Progress Note* - balance interventions including: narrow BOS, reaching/turning/rotating, eyes closed, and alternating foot taps - gait training without AD, encourage AD use also means independence (very reluctant to AD use)      Ege Muckey, PT, DPT, NCS, CSRS Physical Therapist - [facility]  10:04 PM 06/04/24

## 2024-06-06 ENCOUNTER — Ambulatory Visit

## 2024-06-06 DIAGNOSIS — R262 Difficulty in walking, not elsewhere classified: Secondary | ICD-10-CM | POA: Diagnosis not present

## 2024-06-06 DIAGNOSIS — R2681 Unsteadiness on feet: Secondary | ICD-10-CM

## 2024-06-06 DIAGNOSIS — M6281 Muscle weakness (generalized): Secondary | ICD-10-CM

## 2024-06-06 DIAGNOSIS — R269 Unspecified abnormalities of gait and mobility: Secondary | ICD-10-CM

## 2024-06-06 NOTE — Therapy (Signed)
 OUTPATIENT PHYSICAL THERAPY TREATMENT/PHYSICAL THERAPY PROGRESS NOTE   Dates of reporting period  03/04/24   to   06/06/24   Patient Name: Joseph [name] MRN: [id number] DOB:Jul 15, 1925, 89 y.o., male Today's Date: 06/06/2024  PCP: Dr. Charlie Forte REFERRING PROVIDER: Gustavo Igo, PA  END OF SESSION:    PT End of Session - 06/06/24 1354     Visit Number 20    Number of Visits 39    Date for PT Re-Evaluation 06/13/24    Authorization Type [facility] approval [id number] for 15 more visits (06/04/2024 is 4 of 15)    Progress Note Due on Visit 30    PT Start Time 1400    PT Stop Time 1440    PT Time Calculation (min) 40 min    Equipment Utilized During Treatment Gait belt    Activity Tolerance Patient tolerated treatment well;No increased pain    Behavior During Therapy [facility] for tasks assessed/performed            Past Medical History:  Diagnosis Date   Acute respiratory failure with hypoxia (HCC) 03/21/2016   Atrial fibrillation (HCC) 03/22/2023   CAD (coronary artery disease)    a. s/p CABG;  b. 07/2012 Cath: 3VD including 80 dLCX (med Rx), 4/4 patent grafts.   Chronic diastolic CHF (congestive heart failure) (HCC)    a. 07/2012 Echo: EF 55-60%, no rwma, Gr 1 DD, mild MR;  04/2014 Echo: EF 55-60%, no rwma, mild MR, mildly dil LA.   Diabetes mellitus, type 2 (HCC)    HTN (hypertension)    Hyperlipidemia    Hypothyroidism    Lower extremity edema    a. 03/2014 amlodipine  d/c'd.   Spinal stenosis    Stroke (HCC)    1/24 and 5/24   Past Surgical History:  Procedure Laterality Date   APPENDECTOMY     CARDIAC CATHETERIZATION  07/2012   [facility]/no stents   CHOLECYSTECTOMY     CORNEAL TRANSPLANT     CORONARY ARTERY BYPASS GRAFT  1994   HEMORRHOID SURGERY     LEFT HEART CATH AND CORS/GRAFTS ANGIOGRAPHY N/A 12/17/2019   Procedure: LEFT HEART CATH AND CORS/GRAFTS ANGIOGRAPHY;  Surgeon: Mady Bruckner, MD;  Location: [facility] INVASIVE CV LAB;  Service: Cardiovascular;  Laterality:  N/A;   PROSTATE BIOPSY     TOE SURGERY     ingrown toe nail   Patient Active Problem List   Diagnosis Date Noted   Chronic anticoagulation 04/23/2023   Stroke (HCC) 04/06/2023   Type II diabetes mellitus with renal manifestations (HCC) 04/06/2023   Chronic kidney disease, stage 3a (HCC) 04/06/2023   COPD (chronic obstructive pulmonary disease) (HCC) 04/06/2023   Chronic diastolic CHF (congestive heart failure) (HCC) 04/06/2023   Permanent atrial fibrillation (HCC) 04/06/2023   Obesity (BMI 30-39.9) 04/06/2023   Acute cerebrovascular accident (CVA) (HCC) 12/09/2022   Chronic combined systolic and diastolic CHF (congestive heart failure) (HCC) 12/09/2022   Fall at home, initial encounter 12/09/2022   Hypokalemia 12/09/2022   Hemiparesis affecting right side as late effect of cerebrovascular accident (CVA) (HCC) 12/09/2022   Skin tear of left hand without complication 11/16/2022   Pain of left hip 11/16/2022   Left hip pain 11/10/2022   Intertrigo 11/10/2022   COPD with acute exacerbation (HCC) 10/06/2022   Diabetes mellitus (HCC) 06/10/2022   Enlarged prostate 03/02/2022   Other specified counseling 03/02/2022   Renal calculi 03/02/2022   Gastroesophageal reflux disease 03/02/2022   Hypertensive disorder 03/02/2022   Mixed hyperlipidemia 03/02/2022  Hypothyroidism 03/02/2022   Acute non-recurrent frontal sinusitis 01/05/2022   Glands swollen 01/05/2022   Hypertension associated with diabetes (HCC) 01/05/2022   Acute rhinitis 01/05/2022   AV block, Mobitz 1    NSTEMI (non-ST elevated myocardial infarction) [facility])    ACS (acute coronary syndrome) (HCC) 12/16/2019   Diverticulitis of large intestine without perforation or abscess without bleeding    Pain of upper abdomen    SOB (shortness of breath)    S/P CABG (coronary artery bypass graft)    Back pain, chronic 07/28/2015   Benign fibroma of prostate 07/28/2015   Acid reflux 07/28/2015   Adult hypothyroidism 07/28/2015    Neuropathy 07/28/2015   Arthritis, degenerative 07/28/2015   Psoriasis 07/28/2015   Lumbar spondylosis 05/08/2015   Obesity 09/09/2014   Bilateral leg weakness 09/09/2014   Acute combined systolic and diastolic heart failure (HCC) 05/13/2014   Bilateral leg edema 04/15/2014   Bradycardia 08/13/2013   Edema 06/21/2012   Weakness 09/15/2011   Type 2 diabetes mellitus with diabetic peripheral angiopathy without gangrene, with long-term current use of insulin  (HCC) 09/15/2011   DYSPNEA 06/09/2009   Hyperlipidemia 06/07/2009   Essential hypertension 06/07/2009   Coronary artery disease 06/07/2009   ONSET DATE: Jan 2024  REFERRING DIAG:  R26.81 (ICD-10-CM) - Gait instability  R26.89 (ICD-10-CM) - Decreased mobility    THERAPY DIAG:  Difficulty in walking, not elsewhere classified  Abnormality of gait  Unsteadiness on feet  Difficulty walking  Muscle weakness (generalized)  Rationale for Evaluation and Treatment: Rehabilitation  SUBJECTIVE:                                                                                                                                                                                          SUBJECTIVE STATEMENT:    Pt reports he is ready to be assessed for goals and progress report.      Pt accompanied by: caregiver SUSAN  PERTINENT HISTORY: Patient reports having 2 CVA last year (1 CVA affecting right and 1 affecting left side). Prior to CVA he reports he was walking normally without an AD. States has been walking with walker ever since.   PAIN:  Are you having pain? No PRECAUTIONS: Fall WEIGHT BEARING RESTRICTIONS: No FALLS: Has patient fallen in last 6 months? Yes. Number of falls 1  LIVING ENVIRONMENT: Lives with: lives alone and has private  caregivers - everyday  and [facility] caregivers -3x/week - approx 8 hours/day Lives in: House/apartment Stairs: has ramp supplied by [employer] Has following equipment at home: Single point cane, Environmental consultant -  2 wheeled, Wheelchair (manual), Shower bench, bed side commode, Grab bars, Ramped entry,  and Transport w/c  PLOF: Independent  PATIENT GOALS: I want to walk without walker independently  OBJECTIVE:  Note: Objective measures were completed at Evaluation unless otherwise noted.  DIAGNOSTIC FINDINGS: CLINICAL DATA:  Stroke, follow up   EXAM: CT HEAD WITHOUT CONTRAST   TECHNIQUE: Contiguous axial images were obtained from the base of the skull through the vertex without intravenous contrast.   RADIATION DOSE REDUCTION: This exam was performed according to the departmental dose-optimization program which includes automated exposure control, adjustment of the mA and/or kV according to patient size and/or use of iterative reconstruction technique.   COMPARISON:  MR Brain 04/06/23, CT head 04/06/23   FINDINGS: Brain: No hemorrhage. No hydrocephalus. There are prominent subarachnoid spaces bilaterally, unchanged. There is sequela of moderate chronic microvascular ischemic change with a chronic infarct in the left corona radiata and an acute infarct in the right basal ganglia, better characterized on recent brain MRI.   Vascular: No hyperdense vessel or unexpected calcification.   Skull: Normal. Negative for fracture or focal lesion.   Sinuses/Orbits: No mastoid or middle ear effusion. Paranasal sinuses are clear. Bilateral lens replacement. Orbits are otherwise unremarkable.   Other: None.   IMPRESSION: 1. No acute intracranial hemorrhage. 2. Acute infarct in the right basal ganglia, better characterized on recent brain MRI    Electronically Signed   By: Lyndall Gore M.D.   On: 04/07/2023 16:08  COGNITION: Overall cognitive status: Within functional limits for tasks assessed EDEMA:  Some edema noted with lower left LE  LOWER EXTREMITY MMT:    MMT Right Eval Left Eval  Hip flexion 4 4  Hip extension    Hip abduction    Hip adduction    Hip internal rotation 4 4   Hip external rotation 4 4  Knee flexion 4 4  Knee extension 4 4  Ankle dorsiflexion 4 4  Ankle plantarflexion    Ankle inversion    Ankle eversion    (Blank rows = not tested)  TRANSFERS: Assistive device utilized: Environmental consultant - 2 wheeled  Sit to stand: CGA Stand to sit: CGA Chair to chair: CGA Floor: not tested  GAIT: Gait pattern: decreased step length- Right and decreased step length- Left Distance walked: > 100 feet  Assistive device utilized: Walker - 2 wheeled Level of assistance: CGA Comments: anterior trunk lean  FUNCTIONAL TESTS:  5 times sit to stand: 22.32 sec without UE support Timed up and go (TUG): 27.94 sec  6 minute walk test: To be assessed visit # 2 10 meter walk test: 16.10 and 14.92 sec with RW=15.51 sec avg or 0.65 m/s using RW  Berg Balance Scale: To be assessed visit #2  PATIENT SURVEYS:  Stroke Impact Scale 52 ABC scale 37.5 %                                                                                                                             TREATMENT DATE: 06/06/24  Physical Performance Testing:  Five times Sit to Stand Test (FTSS)  TIME: 19.39 sec  Cut off scores indicative of increased fall risk: >12 sec CVA, >16 sec PD, >13 sec vestibular (ANPTA Core Set of Outcome Measures for Adults with Neurologic Conditions, 2018)   Activities-specific Balance Confidence Scale:  Score: 53.125% Increased risk of falls in community-dwelling, older adults <80% (79.89%)  0% = no confidence - 100% = complete confidence (ANPTA Core Set of Outcome Measures for Adults with Neurologic Conditions, 2018)   Timed-Up and Go (TUG) PT instructed pt in TUG: 34.06 sec ( >13.5 sec indicates increased fall risk)   10 Meter Walk Test: Patient instructed to walk 10 meters (32.8 ft) as quickly and as safely as possible at their normal speed Results: .64 m/s   Cut off scores:   Household Ambulator  < 0.4 m/s  Limited Community Ambulator  0.4 - 0.8  m/s  [employer]  > 0.8 m/s  Increased fall risk  < 1.30m/s  Crossing a Street  >1.26m/s  MCID 0.05 m/s (small), 0.13 m/s (moderate), 0.06 m/s (significant)  (ANPTA Core Set of Outcome Measures for Adults with Neurologic Conditions, 2018)    6 Min Walk Test:  Instructed patient to ambulate as quickly and as safely as possible for 6 minutes using LRAD. Patient was allowed to take standing rest breaks without stopping the test, but if the patient required a sitting rest break the clock would be stopped and the test would be over.  Results: 438 feet using a RW . Results indicate that the patient has reduced endurance with ambulation compared to age matched norms.  Age Matched Norms (in meters): 36-69 yo M: 23 F: 15, 70-79 yo M: 80 F: 471, 52-89 yo M: 417 F: 392 MDC: 58.21 meters (190.98 feet) or 50 meters (ANPTA Core Set of Outcome Measures for Adults with Neurologic Conditions, 2018)   Stroke Impact Scale 16 (Copyrighted instrument, [facility])  In the past 2 weeks, how difficult was it to...  Rating Scale 5 = Not difficult at all 4 = A little difficult 3 = Somewhat difficult 2 = Very difficult 1 = Could not do at all  a. Dress the top part of your body? 5  b. Bathe yourself? 3  c. Get to the toilet on time? 5  d. Control your bladder (not have an accident)? 5  e. Control your bowels (not have an accident)? 5  f. Stand without losing balance? 5  g. Go shopping? 3  h. Do heavy household chores (e.g. vacuum, laundry or  yard work)? 1  i. Stay sitting without losing your balance? 5  j. Walk without losing your balance? 5  k. Move from a bed to a chair? 5  l. Walk fast? 1  m. Climb one flight of stairs? 5  n. Walk one block? 5  o. Get in and out of a car? 4  p. Carry heavy objects (e.g. bag of groceries) with your  affected hand? 4  Sum:  66/80  MDC (Minimal Detectable Change) is >/=8       PATIENT EDUCATION: Education details: exercise  technique Person educated: Patient,  Education method: Explanation Education comprehension: verbalized understanding  HOME EXERCISE PROGRAM:  Access Code: [email or web address] URL: [email or web address] Date: 01/25/2024 Prepared by: Connell Kiss  Exercises - Sit to Stand with Counter Support  - 1 x daily - 7 x weekly - 2 sets - 10 reps - Narrow Stance with Counter Support  -  1 x daily - 7 x weekly - 2 sets - 30 seconds hold - Narrow Stance with Head Nods and Counter Support  - 1 x daily - 7 x weekly - 2 sets - 10 reps  Access Code: [email or web address] URL: [email or web address] Date: 05/15/2024 Prepared by: Marina  [name]  Exercises - Sit to Stand with Counter Support  - 1 x daily - 7 x weekly - 2 sets - 10 reps - Narrow Stance with Counter Support  - 1 x daily - 7 x weekly - 2 sets - 30 seconds hold - Narrow Stance with Head Nods and Counter Support  - 1 x daily - 7 x weekly - 2 sets - 10 reps - Standing March with Counter Support  - 1 x daily - 7 x weekly - 2 sets - 10 reps - 5 hold - Leg Extension  - 1 x daily - 7 x weekly - 2 sets - 10 reps - 5 hold - Seated Single Leg Hip Abduction with Resistance  - 1 x daily - 7 x weekly - 2 sets - 10 reps - 5 hold  GOALS: Goals reviewed with patient? Yes  SHORT TERM GOALS: Target date: 02/29/2024  Pt will be independent with HEP in order to improve strength and balance in order to decrease fall risk and improve function at home and work.  Baseline: EVAL: no formal HEP in place 4/8: Completing 5 days per week Goal status: MET  LONG TERM GOALS: Target date: 06/13/2024  1.  Patient (> 44 years old) will complete five times sit to stand test in < 15 seconds indicating an increased LE strength and improved balance. Baseline: EVAL: 22.32 sec without UE support 02/27/24: 22.81 sec no UE support;  03/21/2024= 16.17 sec without UE Support 06/06/24: 19.39 sec without UE support Goal status: PROGRESSING  2.  Pt will improve ABC by at least  13% in order to demonstrate clinically significant improvement in balance confidence Baseline: EVAL: 37.5% 05/09/24: 70%, question validity compared to prior assessment.  06/06/24: 53.125% Goal status: IN PROGRESS   3.  Patient will increase Berg Balance score by > 6 points to demonstrate decreased fall risk during functional activities. Baseline: 01/25/24: 25/56 02/27/24: 31 05/09/24: 33/56  06/06/24: deferred to next visit. Goal status: IN PROGRESS   4.  Patient will reduce timed up and go to <11 seconds to reduce fall risk and demonstrate improved transfer/gait ability. Baseline: EVAL: 27.94 with RW 4/8: 35.58 sec with RW; 03/21/24= 26.88 sec 06/06/24: 34.06 sec Goal status: PROGRESSING  5.  Patient will increase 10 meter walk test to >1.14m/s as to improve gait speed for better community ambulation and to reduce fall risk. Baseline: EVAL: 0.65 m/s 02/27/24: .43 m/s with RW; 03/21/24= 0.71 m/s using RW 6/19: 0.57m/s using RW 06/06/24: 0.64 m/s with RW Goal status: PROGRESSING   6.  Patient will increase six minute walk test distance to >1000 for progression to community ambulator and improve gait ability Baseline: EVAL: To be assessed  visit #2 4/8: 600 ft; 03/21/24: 600 feet in 5 min using RW- stopped due to fatigue. 06/06/24: 438' using the RW Goal status: PROGRESSING  7.  Patient will improve score on Stroke Impact Scale 16 by 8 points to signify a higher level of functioning and less difficulty with daily activities.  Baseline: EVAL: 52  02/27/24:55;  03/21/2024: 64 06/06/24: 66 Goal status: PROGRESSING   ASSESSMENT:  CLINICAL IMPRESSION:    Pt participated in goal assessment for progress note and  all the goals have been updated.  Pt very fatigued after performing , which may have attributed to the lower scores on the other tests.  Pt ultimately feels as though he has made significant improvement towards his rehab progress and is wanting to improve his endurance levels necessary for  increased ambulation.  Pt and caregiver given reiterated instructions on performing 2 minute ambulation attempts at home in order to improve cardiovascular and musculare endurance performance.  Pt understanding and reports he will continue to perform at home.  Patient's condition has the potential to improve in response to therapy. Maximum improvement is yet to be obtained. The anticipated improvement is attainable and reasonable in a generally predictable time.   Pt will continue to benefit from skilled therapy to address remaining deficits in order to improve overall QoL and return to PLOF.      OBJECTIVE IMPAIRMENTS: Abnormal gait, decreased activity tolerance, decreased balance, decreased endurance, decreased mobility, and decreased strength.   ACTIVITY LIMITATIONS: carrying, lifting, bending, standing, squatting, and stairs  PARTICIPATION LIMITATIONS: cleaning, laundry, driving, shopping, community activity, and yard work  PERSONAL FACTORS: Age and 3+ comorbidities: HTN, CHF, A-FIB, DM are also affecting patient's functional outcome.   REHAB POTENTIAL: Good  CLINICAL DECISION MAKING: Evolving/moderate complexity  EVALUATION COMPLEXITY: Moderate  PLAN:  PT FREQUENCY: 1-2x/week  PT DURATION: 12 weeks  PLANNED INTERVENTIONS: [id number]- PT Re-evaluation, 97110-Therapeutic exercises, 97530- Therapeutic activity, 97112- Neuromuscular re-education, 97535- Self Care, [id number]- Manual therapy, [id number]- Gait training, [id number]- Canalith repositioning, [phone]- Electrical stimulation (manual), Patient/Family education, Balance training, Stair training, Dry Needling, Joint mobilization, Vestibular training, DME instructions, Cryotherapy, and Moist heat  PLAN FOR NEXT SESSION:   *BERG* - balance interventions including: narrow BOS, reaching/turning/rotating, eyes closed, and alternating foot taps - gait training without AD, encourage AD use also means independence (very reluctant to AD use)     Fonda Simpers, PT, DPT Physical Therapist - [facility]  06/06/24, 6:10 PM

## 2024-06-10 ENCOUNTER — Ambulatory Visit

## 2024-06-11 ENCOUNTER — Ambulatory Visit

## 2024-06-11 DIAGNOSIS — R269 Unspecified abnormalities of gait and mobility: Secondary | ICD-10-CM

## 2024-06-11 DIAGNOSIS — R2681 Unsteadiness on feet: Secondary | ICD-10-CM

## 2024-06-11 DIAGNOSIS — R262 Difficulty in walking, not elsewhere classified: Secondary | ICD-10-CM | POA: Diagnosis not present

## 2024-06-11 DIAGNOSIS — M6281 Muscle weakness (generalized): Secondary | ICD-10-CM

## 2024-06-11 NOTE — Therapy (Signed)
 OUTPATIENT PHYSICAL THERAPY TREATMENT   Patient Name: Joseph [name] MRN: [id number] DOB:[birth year], 89 y.o., male Today's Date: 06/11/2024  PCP: Dr. Charlie Forte REFERRING PROVIDER: Gustavo Igo, PA  END OF SESSION:    PT End of Session - 06/11/24 1057     Visit Number 21    Number of Visits 39    Date for PT Re-Evaluation 06/13/24    Authorization Type [facility] approval [id number] for 15 more visits (06/04/2024 is 4 of 15)    Progress Note Due on Visit 30    PT Start Time 1100    PT Stop Time 1141    PT Time Calculation (min) 41 min    Equipment Utilized During Treatment Gait belt    Activity Tolerance Patient tolerated treatment well;No increased pain    Behavior During Therapy [facility] for tasks assessed/performed             Past Medical History:  Diagnosis Date   Acute respiratory failure with hypoxia (HCC) 03/21/2016   Atrial fibrillation (HCC) 03/22/2023   CAD (coronary artery disease)    a. s/p CABG;  b. 07/2012 Cath: 3VD including 80 dLCX (med Rx), 4/4 patent grafts.   Chronic diastolic CHF (congestive heart failure) (HCC)    a. 07/2012 Echo: EF 55-60%, no rwma, Gr 1 DD, mild MR;  04/2014 Echo: EF 55-60%, no rwma, mild MR, mildly dil LA.   Diabetes mellitus, type 2 (HCC)    HTN (hypertension)    Hyperlipidemia    Hypothyroidism    Lower extremity edema    a. 03/2014 amlodipine  d/c'd.   Spinal stenosis    Stroke (HCC)    1/24 and 5/24   Past Surgical History:  Procedure Laterality Date   APPENDECTOMY     CARDIAC CATHETERIZATION  07/2012   [facility]/no stents   CHOLECYSTECTOMY     CORNEAL TRANSPLANT     CORONARY ARTERY BYPASS GRAFT  1994   HEMORRHOID SURGERY     LEFT HEART CATH AND CORS/GRAFTS ANGIOGRAPHY N/A 12/17/2019   Procedure: LEFT HEART CATH AND CORS/GRAFTS ANGIOGRAPHY;  Surgeon: Mady Bruckner, MD;  Location: [facility] INVASIVE CV LAB;  Service: Cardiovascular;  Laterality: N/A;   PROSTATE BIOPSY     TOE SURGERY     ingrown toe nail   Patient Active  Problem List   Diagnosis Date Noted   Chronic anticoagulation 04/23/2023   Stroke (HCC) 04/06/2023   Type II diabetes mellitus with renal manifestations (HCC) 04/06/2023   Chronic kidney disease, stage 3a (HCC) 04/06/2023   COPD (chronic obstructive pulmonary disease) (HCC) 04/06/2023   Chronic diastolic CHF (congestive heart failure) (HCC) 04/06/2023   Permanent atrial fibrillation (HCC) 04/06/2023   Obesity (BMI 30-39.9) 04/06/2023   Acute cerebrovascular accident (CVA) (HCC) 12/09/2022   Chronic combined systolic and diastolic CHF (congestive heart failure) (HCC) 12/09/2022   Fall at home, initial encounter 12/09/2022   Hypokalemia 12/09/2022   Hemiparesis affecting right side as late effect of cerebrovascular accident (CVA) (HCC) 12/09/2022   Skin tear of left hand without complication 11/16/2022   Pain of left hip 11/16/2022   Left hip pain 11/10/2022   Intertrigo 11/10/2022   COPD with acute exacerbation (HCC) 10/06/2022   Diabetes mellitus (HCC) 06/10/2022   Enlarged prostate 03/02/2022   Other specified counseling 03/02/2022   Renal calculi 03/02/2022   Gastroesophageal reflux disease 03/02/2022   Hypertensive disorder 03/02/2022   Mixed hyperlipidemia 03/02/2022   Hypothyroidism 03/02/2022   Acute non-recurrent frontal sinusitis 01/05/2022   Glands swollen 01/05/2022  Hypertension associated with diabetes (HCC) 01/05/2022   Acute rhinitis 01/05/2022   AV block, Mobitz 1    NSTEMI (non-ST elevated myocardial infarction) [facility])    ACS (acute coronary syndrome) (HCC) 12/16/2019   Diverticulitis of large intestine without perforation or abscess without bleeding    Pain of upper abdomen    SOB (shortness of breath)    S/P CABG (coronary artery bypass graft)    Back pain, chronic 07/28/2015   Benign fibroma of prostate 07/28/2015   Acid reflux 07/28/2015   Adult hypothyroidism 07/28/2015   Neuropathy 07/28/2015   Arthritis, degenerative 07/28/2015   Psoriasis  07/28/2015   Lumbar spondylosis 05/08/2015   Obesity 09/09/2014   Bilateral leg weakness 09/09/2014   Acute combined systolic and diastolic heart failure (HCC) 05/13/2014   Bilateral leg edema 04/15/2014   Bradycardia 08/13/2013   Edema 06/21/2012   Weakness 09/15/2011   Type 2 diabetes mellitus with diabetic peripheral angiopathy without gangrene, with long-term current use of insulin  (HCC) 09/15/2011   DYSPNEA 06/09/2009   Hyperlipidemia 06/07/2009   Essential hypertension 06/07/2009   Coronary artery disease 06/07/2009   ONSET DATE: Jan 2024  REFERRING DIAG:  R26.81 (ICD-10-CM) - Gait instability  R26.89 (ICD-10-CM) - Decreased mobility    THERAPY DIAG:  Difficulty in walking, not elsewhere classified  Abnormality of gait  Unsteadiness on feet  Muscle weakness (generalized)  Rationale for Evaluation and Treatment: Rehabilitation  SUBJECTIVE:                                                                                                                                                                                          SUBJECTIVE STATEMENT:    Pt reports that he has done his exercises and walking at home with the walker. Pt states that he wants to be able to walk without walker so that he can get around his house easier.     Pt accompanied by: caregiver SUSAN  PERTINENT HISTORY: Patient reports having 2 CVA last year (1 CVA affecting right and 1 affecting left side). Prior to CVA he reports he was walking normally without an AD. States has been walking with walker ever since.   PAIN:  Are you having pain? No PRECAUTIONS: Fall WEIGHT BEARING RESTRICTIONS: No FALLS: Has patient fallen in last 6 months? Yes. Number of falls 1  LIVING ENVIRONMENT: Lives with: lives alone and has private  caregivers - everyday  and [facility] caregivers -3x/week - approx 8 hours/day Lives in: House/apartment Stairs: has ramp supplied by [employer] Has following equipment at home: Single  point cane, Environmental consultant - 2 wheeled, Wheelchair (manual), Shower bench, bed side commode, [employer]  bars, Ramped entry, and Transport w/c  PLOF: Independent  PATIENT GOALS: I want to walk without walker independently  OBJECTIVE:  Note: Objective measures were completed at Evaluation unless otherwise noted.  DIAGNOSTIC FINDINGS: CLINICAL DATA:  Stroke, follow up   EXAM: CT HEAD WITHOUT CONTRAST   TECHNIQUE: Contiguous axial images were obtained from the base of the skull through the vertex without intravenous contrast.   RADIATION DOSE REDUCTION: This exam was performed according to the departmental dose-optimization program which includes automated exposure control, adjustment of the mA and/or kV according to patient size and/or use of iterative reconstruction technique.   COMPARISON:  MR Brain 04/06/23, CT head 04/06/23   FINDINGS: Brain: No hemorrhage. No hydrocephalus. There are prominent subarachnoid spaces bilaterally, unchanged. There is sequela of moderate chronic microvascular ischemic change with a chronic infarct in the left corona radiata and an acute infarct in the right basal ganglia, better characterized on recent brain MRI.   Vascular: No hyperdense vessel or unexpected calcification.   Skull: Normal. Negative for fracture or focal lesion.   Sinuses/Orbits: No mastoid or middle ear effusion. Paranasal sinuses are clear. Bilateral lens replacement. Orbits are otherwise unremarkable.   Other: None.   IMPRESSION: 1. No acute intracranial hemorrhage. 2. Acute infarct in the right basal ganglia, better characterized on recent brain MRI    Electronically Signed   By: Lyndall Gore M.D.   On: 04/07/2023 16:08  COGNITION: Overall cognitive status: Within functional limits for tasks assessed EDEMA:  Some edema noted with lower left LE  LOWER EXTREMITY MMT:    MMT Right Eval Left Eval  Hip flexion 4 4  Hip extension    Hip abduction    Hip adduction    Hip  internal rotation 4 4  Hip external rotation 4 4  Knee flexion 4 4  Knee extension 4 4  Ankle dorsiflexion 4 4  Ankle plantarflexion    Ankle inversion    Ankle eversion    (Blank rows = not tested)  TRANSFERS: Assistive device utilized: Environmental consultant - 2 wheeled  Sit to stand: CGA Stand to sit: CGA Chair to chair: CGA Floor: not tested  GAIT: Gait pattern: decreased step length- Right and decreased step length- Left Distance walked: > 100 feet  Assistive device utilized: Walker - 2 wheeled Level of assistance: CGA Comments: anterior trunk lean  FUNCTIONAL TESTS:  5 times sit to stand: 22.32 sec without UE support Timed up and go (TUG): 27.94 sec  6 minute walk test: To be assessed visit # 2 10 meter walk test: 16.10 and 14.92 sec with RW=15.51 sec avg or 0.65 m/s using RW  Berg Balance Scale: To be assessed visit #2  PATIENT SURVEYS:  Stroke Impact Scale 52 ABC scale 37.5 %                                                                                                                             TREATMENT DATE:  06/11/24    Physical Performance Testing: BERG Balance Scale: intermittent seated rest breaks during assessment   [place] PT Assessment - 06/11/24 0001       Berg Balance Test   Sit to Stand Able to stand without using hands and stabilize independently    Standing Unsupported Able to stand 2 minutes with supervision    Sitting with Back Unsupported but Feet Supported on Floor or Stool Able to sit safely and securely 2 minutes    Stand to Sit Sits safely with minimal use of hands    Transfers Needs one person to assist    Standing Unsupported with Eyes Closed Able to stand 10 seconds with supervision    Standing Unsupported with Feet Together Needs help to attain position but able to stand for 30 seconds with feet together    From Standing, Reach Forward with Outstretched Arm Can reach forward >12 cm safely (5)    From Standing Position, Pick up Object from Floor  Able to pick up shoe, needs supervision    From Standing Position, Turn to Look Behind Over each Shoulder Needs assist to keep from losing balance and falling    Turn 360 Degrees Needs close supervision or verbal cueing    Standing Unsupported, Alternately Place Feet on Step/Stool Able to complete >2 steps/needs minimal assist    Standing Unsupported, One Foot in Front Needs help to step but can hold 15 seconds    Standing on One Leg Unable to try or needs assist to prevent fall    Total Score 29           Ther-Act: Ambulation with 2WW- 146ft with cues for upright posture and maintaining 2WW in contact with ground during turns- pt states that he is not pushing down on walker as much today; pt reported at end of bout that it was a really long ways SpO2 97 HR 82 after, pt reported that he felt winded; SpO2 96 HR 62 after 3 minutes seated rest  Ambulation with hand-held assistance and WC follow x63ft- pt very unsteady for first few steps initially with no UE support, SPT then provided L hand-hold assistance and pt was more steady with decreased episodes of LOB.  Seated: - LAQ with 1.5# AW x10 each LE- visible fatigue/difficulty with R LE - Marches with 1.5# AW x10 each LE - Adduction squeeze with rainbow ball x10     PATIENT EDUCATION: Education details: exercise technique Person educated: Patient,  Education method: Explanation Education comprehension: verbalized understanding  HOME EXERCISE PROGRAM:  Access Code: [email or web address] URL: [email or web address] Date: 01/25/2024 Prepared by: Connell Kiss  Exercises - Sit to Stand with Counter Support  - 1 x daily - 7 x weekly - 2 sets - 10 reps - Narrow Stance with Counter Support  - 1 x daily - 7 x weekly - 2 sets - 30 seconds hold - Narrow Stance with Head Nods and Counter Support  - 1 x daily - 7 x weekly - 2 sets - 10 reps  Access Code: [email or web address] URL: [email or web address] Date: 05/15/2024 Prepared  by: Marina  [name]  Exercises - Sit to Stand with Counter Support  - 1 x daily - 7 x weekly - 2 sets - 10 reps - Narrow Stance with Counter Support  - 1 x daily - 7 x weekly - 2 sets - 30 seconds hold - Narrow Stance with Head Nods and Counter Support  - 1 x daily - 7 x weekly - 2 sets - 10 reps -  Standing March with Counter Support  - 1 x daily - 7 x weekly - 2 sets - 10 reps - 5 hold - Leg Extension  - 1 x daily - 7 x weekly - 2 sets - 10 reps - 5 hold - Seated Single Leg Hip Abduction with Resistance  - 1 x daily - 7 x weekly - 2 sets - 10 reps - 5 hold  GOALS: Goals reviewed with patient? Yes  SHORT TERM GOALS: Target date: 02/29/2024  Pt will be independent with HEP in order to improve strength and balance in order to decrease fall risk and improve function at home and work.  Baseline: EVAL: no formal HEP in place 4/8: Completing 5 days per week Goal status: MET  LONG TERM GOALS: Target date: 06/13/2024  1.  Patient (> 45 years old) will complete five times sit to stand test in < 15 seconds indicating an increased LE strength and improved balance. Baseline: EVAL: 22.32 sec without UE support 02/27/24: 22.81 sec no UE support;  03/21/2024= 16.17 sec without UE Support 06/06/24: 19.39 sec without UE support Goal status: PROGRESSING  2.  Pt will improve ABC by at least 13% in order to demonstrate clinically significant improvement in balance confidence Baseline: EVAL: 37.5% 05/09/24: 70%, question validity compared to prior assessment.  06/06/24: 53.125% Goal status: IN PROGRESS   3.  Patient will increase Berg Balance score by > 6 points to demonstrate decreased fall risk during functional activities. Baseline: 01/25/24: 25/56 02/27/24: 31 05/09/24: 33/56  06/11/24: 29/56 Goal status: IN PROGRESS   4.  Patient will reduce timed up and go to <11 seconds to reduce fall risk and demonstrate improved transfer/gait ability. Baseline: EVAL: 27.94 with RW 4/8: 35.58 sec with RW; 03/21/24= 26.88  sec 06/06/24: 34.06 sec Goal status: PROGRESSING  5.  Patient will increase 10 meter walk test to >1.33m/s as to improve gait speed for better community ambulation and to reduce fall risk. Baseline: EVAL: 0.65 m/s 02/27/24: .43 m/s with RW; 03/21/24= 0.71 m/s using RW 6/19: 0.60m/s using RW 06/06/24: 0.64 m/s with RW Goal status: PROGRESSING   6.  Patient will increase six minute walk test distance to >1000 for progression to community ambulator and improve gait ability Baseline: EVAL: To be assessed  visit #2 4/8: 600 ft; 03/21/24: 600 feet in 5 min using RW- stopped due to fatigue. 06/06/24: 438' using the RW Goal status: PROGRESSING  7.  Patient will improve score on Stroke Impact Scale 16 by 8 points to signify a higher level of functioning and less difficulty with daily activities.  Baseline: EVAL: 52  02/27/24:55;  03/21/2024: 64 06/06/24: 66 Goal status: PROGRESSING   ASSESSMENT:  CLINICAL IMPRESSION:    Pt presented with decreased endurance today and was visibly more unsteady without UE support. Pt's poorer performance on BERG assessment could be due to fatigue at pt's baseline today. Pt remains very insistent on reaching the point of no longer using 2WW for ambulation, however, pt continues to demonstrate in-session how essential UE support is for pt to maintain safe ambulation. Thorough education provided in session on the importance of using 2WW to prevent falls and keep pt safe at home. Pt is highly motivated to continue improving his mobility, balance, and ambulation. Pt will continue to benefit from skilled therapy to address remaining deficits in order to improve overall QoL and return to PLOF.      OBJECTIVE IMPAIRMENTS: Abnormal gait, decreased activity tolerance, decreased balance, decreased endurance, decreased mobility,  and decreased strength.   ACTIVITY LIMITATIONS: carrying, lifting, bending, standing, squatting, and stairs  PARTICIPATION LIMITATIONS: cleaning, laundry,  driving, shopping, community activity, and yard work  PERSONAL FACTORS: Age and 3+ comorbidities: HTN, CHF, A-FIB, DM are also affecting patient's functional outcome.   REHAB POTENTIAL: Good  CLINICAL DECISION MAKING: Evolving/moderate complexity  EVALUATION COMPLEXITY: Moderate  PLAN:  PT FREQUENCY: 1-2x/week  PT DURATION: 12 weeks  PLANNED INTERVENTIONS: [id number]- PT Re-evaluation, 97110-Therapeutic exercises, 97530- Therapeutic activity, 97112- Neuromuscular re-education, 97535- Self Care, [id number]- Manual therapy, [id number]- Gait training, [phone]- Canalith repositioning, [phone]- Electrical stimulation (manual), Patient/Family education, Balance training, Stair training, Dry Needling, Joint mobilization, Vestibular training, DME instructions, Cryotherapy, and Moist heat  PLAN FOR NEXT SESSION:   - balance interventions including: narrow BOS, reaching/turning/rotating, eyes closed, and alternating foot taps - gait training without AD, encourage AD use also means independence (very reluctant to AD use)    Emaya Preston, SPT  This entire session was performed under direct supervision and direction of a licensed Estate agent . I have personally read, edited and approve of the note as written.  Marina  Leopoldo PT, DPT Physical Therapist - [facility]  Outpatient Physical Therapy- Main Campus [phone]     06/11/24, 1:18 PM

## 2024-06-13 ENCOUNTER — Ambulatory Visit: Admitting: Physical Therapy

## 2024-06-13 DIAGNOSIS — R2681 Unsteadiness on feet: Secondary | ICD-10-CM

## 2024-06-13 DIAGNOSIS — R262 Difficulty in walking, not elsewhere classified: Secondary | ICD-10-CM | POA: Diagnosis not present

## 2024-06-13 DIAGNOSIS — R269 Unspecified abnormalities of gait and mobility: Secondary | ICD-10-CM

## 2024-06-13 DIAGNOSIS — R278 Other lack of coordination: Secondary | ICD-10-CM

## 2024-06-13 DIAGNOSIS — M6281 Muscle weakness (generalized): Secondary | ICD-10-CM

## 2024-06-13 DIAGNOSIS — I69398 Other sequelae of cerebral infarction: Secondary | ICD-10-CM

## 2024-06-13 NOTE — Addendum Note (Signed)
 Addended by: Arbie Reisz M on: 06/13/2024 05:35 PM   Modules accepted: Orders

## 2024-06-13 NOTE — Therapy (Addendum)
 OUTPATIENT PHYSICAL THERAPY TREATMENT/RECERT   Patient Name: Joseph [name] MRN: [id number] DOB:Jan 09, 1925, 89 y.o., male Today's Date: 06/13/2024  PCP: Dr. Charlie Forte REFERRING PROVIDER: Gustavo Igo, PA  END OF SESSION:    PT End of Session - 06/13/24 1539     Visit Number 22    Number of Visits 39    Date for PT Re-Evaluation 09/05/24    Authorization Type [facility] approval [id number] for 15 more visits (06/13/2024 is 7 of 15)    Progress Note Due on Visit 30    PT Start Time 1538    PT Stop Time 1617    PT Time Calculation (min) 39 min    Equipment Utilized During Treatment Gait belt    Activity Tolerance Patient tolerated treatment well;No increased pain    Behavior During Therapy [facility] for tasks assessed/performed            Past Medical History:  Diagnosis Date   Acute respiratory failure with hypoxia (HCC) 03/21/2016   Atrial fibrillation (HCC) 03/22/2023   CAD (coronary artery disease)    a. s/p CABG;  b. 07/2012 Cath: 3VD including 80 dLCX (med Rx), 4/4 patent grafts.   Chronic diastolic CHF (congestive heart failure) (HCC)    a. 07/2012 Echo: EF 55-60%, no rwma, Gr 1 DD, mild MR;  04/2014 Echo: EF 55-60%, no rwma, mild MR, mildly dil LA.   Diabetes mellitus, type 2 (HCC)    HTN (hypertension)    Hyperlipidemia    Hypothyroidism    Lower extremity edema    a. 03/2014 amlodipine  d/c'd.   Spinal stenosis    Stroke (HCC)    1/24 and 5/24   Past Surgical History:  Procedure Laterality Date   APPENDECTOMY     CARDIAC CATHETERIZATION  07/2012   [facility]/no stents   CHOLECYSTECTOMY     CORNEAL TRANSPLANT     CORONARY ARTERY BYPASS GRAFT  1994   HEMORRHOID SURGERY     LEFT HEART CATH AND CORS/GRAFTS ANGIOGRAPHY N/A 12/17/2019   Procedure: LEFT HEART CATH AND CORS/GRAFTS ANGIOGRAPHY;  Surgeon: Mady Bruckner, MD;  Location: [facility] INVASIVE CV LAB;  Service: Cardiovascular;  Laterality: N/A;   PROSTATE BIOPSY     TOE SURGERY     ingrown toe nail   Patient Active  Problem List   Diagnosis Date Noted   Chronic anticoagulation 04/23/2023   Stroke (HCC) 04/06/2023   Type II diabetes mellitus with renal manifestations (HCC) 04/06/2023   Chronic kidney disease, stage 3a (HCC) 04/06/2023   COPD (chronic obstructive pulmonary disease) (HCC) 04/06/2023   Chronic diastolic CHF (congestive heart failure) (HCC) 04/06/2023   Permanent atrial fibrillation (HCC) 04/06/2023   Obesity (BMI 30-39.9) 04/06/2023   Acute cerebrovascular accident (CVA) (HCC) 12/09/2022   Chronic combined systolic and diastolic CHF (congestive heart failure) (HCC) 12/09/2022   Fall at home, initial encounter 12/09/2022   Hypokalemia 12/09/2022   Hemiparesis affecting right side as late effect of cerebrovascular accident (CVA) (HCC) 12/09/2022   Skin tear of left hand without complication 11/16/2022   Pain of left hip 11/16/2022   Left hip pain 11/10/2022   Intertrigo 11/10/2022   COPD with acute exacerbation (HCC) 10/06/2022   Diabetes mellitus (HCC) 06/10/2022   Enlarged prostate 03/02/2022   Other specified counseling 03/02/2022   Renal calculi 03/02/2022   Gastroesophageal reflux disease 03/02/2022   Hypertensive disorder 03/02/2022   Mixed hyperlipidemia 03/02/2022   Hypothyroidism 03/02/2022   Acute non-recurrent frontal sinusitis 01/05/2022   Glands swollen 01/05/2022  Hypertension associated with diabetes (HCC) 01/05/2022   Acute rhinitis 01/05/2022   AV block, [place] 1    NSTEMI (non-ST elevated myocardial infarction) [facility])    ACS (acute coronary syndrome) (HCC) 12/16/2019   Diverticulitis of large intestine without perforation or abscess without bleeding    Pain of upper abdomen    SOB (shortness of breath)    S/P CABG (coronary artery bypass graft)    Back pain, chronic 07/28/2015   Benign fibroma of prostate 07/28/2015   Acid reflux 07/28/2015   Adult hypothyroidism 07/28/2015   Neuropathy 07/28/2015   Arthritis, degenerative 07/28/2015   Psoriasis  07/28/2015   Lumbar spondylosis 05/08/2015   Obesity 09/09/2014   Bilateral leg weakness 09/09/2014   Acute combined systolic and diastolic heart failure (HCC) 05/13/2014   Bilateral leg edema 04/15/2014   Bradycardia 08/13/2013   Edema 06/21/2012   Weakness 09/15/2011   Type 2 diabetes mellitus with diabetic peripheral angiopathy without gangrene, with long-term current use of insulin  (HCC) 09/15/2011   DYSPNEA 06/09/2009   Hyperlipidemia 06/07/2009   Essential hypertension 06/07/2009   Coronary artery disease 06/07/2009   ONSET DATE: Jan 2024  REFERRING DIAG:  R26.81 (ICD-10-CM) - Gait instability  R26.89 (ICD-10-CM) - Decreased mobility    THERAPY DIAG:  Difficulty in walking, not elsewhere classified  Abnormality of gait  Unsteadiness on feet  Muscle weakness (generalized)  Difficulty walking  Other lack of coordination  Imbalance due to old stroke  Rationale for Evaluation and Treatment: Rehabilitation  SUBJECTIVE:                                                                                                                                                                                          SUBJECTIVE STATEMENT:    Pt reports he is doing alright. Denies pain. Pt states he is walking around the house with his RW some. Pt states he still does his exercises.   Caregiver, Devere, states Tuesday evening patient was able to walk across the street and step-up on a curb to get to the restaurant using his walker.  Denies falls.  Pt accompanied by: caregiver SUSAN  PERTINENT HISTORY: Patient reports having 2 CVA last year (1 CVA affecting right and 1 affecting left side). Prior to CVA he reports he was walking normally without an AD. States has been walking with walker ever since.   PAIN:  Are you having pain? No PRECAUTIONS: Fall WEIGHT BEARING RESTRICTIONS: No FALLS: Has patient fallen in last 6 months? Yes. Number of falls 1  LIVING ENVIRONMENT: Lives  with: lives alone and has private  caregivers - everyday  and [facility] caregivers -3x/week -  approx 8 hours/day Lives in: House/apartment Stairs: has ramp supplied by [employer] Has following equipment at home: Single point cane, Walker - 2 wheeled, Wheelchair (manual), Shower bench, bed side commode, Grab bars, Ramped entry, and Transport w/c  PLOF: Independent  PATIENT GOALS: I want to walk without walker independently  OBJECTIVE:  Note: Objective measures were completed at Evaluation unless otherwise noted.  DIAGNOSTIC FINDINGS: CLINICAL DATA:  Stroke, follow up   EXAM: CT HEAD WITHOUT CONTRAST   TECHNIQUE: Contiguous axial images were obtained from the base of the skull through the vertex without intravenous contrast.   RADIATION DOSE REDUCTION: This exam was performed according to the departmental dose-optimization program which includes automated exposure control, adjustment of the mA and/or kV according to patient size and/or use of iterative reconstruction technique.   COMPARISON:  MR Brain 04/06/23, CT head 04/06/23   FINDINGS: Brain: No hemorrhage. No hydrocephalus. There are prominent subarachnoid spaces bilaterally, unchanged. There is sequela of moderate chronic microvascular ischemic change with a chronic infarct in the left corona radiata and an acute infarct in the right basal ganglia, better characterized on recent brain MRI.   Vascular: No hyperdense vessel or unexpected calcification.   Skull: Normal. Negative for fracture or focal lesion.   Sinuses/Orbits: No mastoid or middle ear effusion. Paranasal sinuses are clear. Bilateral lens replacement. Orbits are otherwise unremarkable.   Other: None.   IMPRESSION: 1. No acute intracranial hemorrhage. 2. Acute infarct in the right basal ganglia, better characterized on recent brain MRI    Electronically Signed   By: Lyndall Gore M.D.   On: 04/07/2023 16:08  COGNITION: Overall cognitive status: Within functional  limits for tasks assessed EDEMA:  Some edema noted with lower left LE  LOWER EXTREMITY MMT:    MMT Right Eval Left Eval  Hip flexion 4 4  Hip extension    Hip abduction    Hip adduction    Hip internal rotation 4 4  Hip external rotation 4 4  Knee flexion 4 4  Knee extension 4 4  Ankle dorsiflexion 4 4  Ankle plantarflexion    Ankle inversion    Ankle eversion    (Blank rows = not tested)  TRANSFERS: Assistive device utilized: Environmental consultant - 2 wheeled  Sit to stand: CGA Stand to sit: CGA Chair to chair: CGA Floor: not tested  GAIT: Gait pattern: decreased step length- Right and decreased step length- Left Distance walked: > 100 feet  Assistive device utilized: Walker - 2 wheeled Level of assistance: CGA Comments: anterior trunk lean  FUNCTIONAL TESTS:  5 times sit to stand: 22.32 sec without UE support Timed up and go (TUG): 27.94 sec  6 minute walk test: To be assessed visit # 2 10 meter walk test: 16.10 and 14.92 sec with RW=15.51 sec avg or 0.65 m/s using RW  Berg Balance Scale: To be assessed visit #2  PATIENT SURVEYS:  Stroke Impact Scale 52 ABC scale 37.5 %  TREATMENT DATE: 06/13/24   Pt arrives to therapy session in transport chair.   R stand pivot transport chair>green chair using L HHA and R UE support on chair armrest with min A for balance.  Gait training ~134ft using RW with CGA/light min assist for steadying. Continues to demo wide BOS, decreased step lengths bilaterally, excessive forward trunk flexed posture, downward gaze, and slow gait speed. Cuing for increased safety of AD management when turning, as pt frequently pushes it too far outside of his BOS. Vitals after: HR 78bpm and SpO2 96% During seated rest break HR decreases to 40bpm and then recovers to 60s (pt with known a-fib)  Therapist providing appropriate seated rest  breaks throughout session.  Gait training ~3ft down/back (1 lap + 2 laps + 2 laps - seated breaks between), no UE support, with skilled min A for balance  1st lap pt having 3x mild LOB requiring therapist's assistance to maintain upright and regain balance 2nd & 3rd laps improved stability although still having minor postural sway Throughout, continues to demo wide BOS to maintain balance, short step lengths bilaterally, pt looking at ground for stability (cuing to look upright), and minor postural sway as well as guarded upper body posturing and lack of arm swing.  Sit<>stands, no UE support, from green chair with min A for balance due to posterior lean/LOB bias - cuing for increased anterior trunk lean - x10reps  Gait training additional ~33ft, no UE support, with skilled min A and pt demoing increased comfortableness with gait by having more relaxed upper body posturing and minor arm swing - patient also having more consistent R/L wt shifting without excessive postural sway, which results in slightly longer step lengths and more normal BOS. All when ambulating in straight pathway.   Before seated rest break performed 5x alternating foot taps to 1st 6 step at stairs using L HR support R HHA for min A - noticed pt with increased weakness in R LE with difficulty lifting foot to clear step on final 2 reps.    Gait training additional ~38ft, no UE support, with skilled min assist as described before with pt having improving gait mechanics, again while in a very controlled environment along a straight path.     PATIENT EDUCATION: Education details: exercise technique Person educated: Patient,  Education method: Explanation Education comprehension: verbalized understanding  HOME EXERCISE PROGRAM:  Access Code: [email or web address] URL: [email or web address] Date: 01/25/2024 Prepared by: Connell Kiss  Exercises - Sit to Stand with Counter Support  - 1 x daily - 7 x weekly - 2 sets - 10  reps - Narrow Stance with Counter Support  - 1 x daily - 7 x weekly - 2 sets - 30 seconds hold - Narrow Stance with Head Nods and Counter Support  - 1 x daily - 7 x weekly - 2 sets - 10 reps  Access Code: [email or web address] URL: [email or web address] Date: 05/15/2024 Prepared by: Marina  [name]  Exercises - Sit to Stand with Counter Support  - 1 x daily - 7 x weekly - 2 sets - 10 reps - Narrow Stance with Counter Support  - 1 x daily - 7 x weekly - 2 sets - 30 seconds hold - Narrow Stance with Head Nods and Counter Support  - 1 x daily - 7 x weekly - 2 sets - 10 reps - Standing March with Counter Support  - 1 x daily - 7 x weekly - 2 sets - 10 reps - 5 hold - Leg Extension  -  1 x daily - 7 x weekly - 2 sets - 10 reps - 5 hold - Seated Single Leg Hip Abduction with Resistance  - 1 x daily - 7 x weekly - 2 sets - 10 reps - 5 hold  GOALS: Goals reviewed with patient? Yes  SHORT TERM GOALS: Target date: 07/25/2024  Pt will be independent with HEP in order to improve strength and balance in order to decrease fall risk and improve function at home and work.  Baseline: EVAL: no formal HEP in place 4/8: Completing 5 days per week 06/13/2024: pt reports consistent compliance with HEP Goal status: MET  LONG TERM GOALS: Target date: 09/05/2024    1.  Patient (> 32 years old) will complete five times sit to stand test in < 15 seconds indicating an increased LE strength and improved balance. Baseline: EVAL: 22.32 sec without UE support 02/27/24: 22.81 sec no UE support;  03/21/2024= 16.17 sec without UE Support 06/06/24: 19.39 sec without UE support Goal status: PROGRESSING  2.  Pt will improve ABC by at least 13% in order to demonstrate clinically significant improvement in balance confidence Baseline: EVAL: 37.5% 05/09/24: 70%, question validity compared to prior assessment.  06/06/24: 53.125% Goal status: IN PROGRESS   3.  Patient will increase Berg Balance score by > 6 points to demonstrate  decreased fall risk during functional activities. Baseline: 01/25/24: 25/56 02/27/24: 31 05/09/24: 33/56  06/11/24: 29/56 Goal status: IN PROGRESS   4.  Patient will reduce timed up and go to <11 seconds to reduce fall risk and demonstrate improved transfer/gait ability. Baseline: EVAL: 27.94 with RW 4/8: 35.58 sec with RW; 03/21/24= 26.88 sec 06/06/24: 34.06 sec Goal status: PROGRESSING  5.  Patient will increase 10 meter walk test to >1.51m/s as to improve gait speed for better community ambulation and to reduce fall risk. Baseline: EVAL: 0.65 m/s 02/27/24: .43 m/s with RW; 03/21/24= 0.71 m/s using RW 6/19: 0.63m/s using RW 06/06/24: 0.64 m/s with RW Goal status: PROGRESSING   6.  Patient will increase six minute walk test distance to >1000 for progression to community ambulator and improve gait ability Baseline: EVAL: To be assessed  visit #2 4/8: 600 ft; 03/21/24: 600 feet in 5 min using RW- stopped due to fatigue. 06/06/24: 438' using the RW Goal status: PROGRESSING  7.  Patient will improve score on Stroke Impact Scale 16 by 8 points to signify a higher level of functioning and less difficulty with daily activities.  Baseline: EVAL: 52  02/27/24:55;  03/21/2024: 64 06/06/24: 66 Goal status: PROGRESSING   ASSESSMENT:  CLINICAL IMPRESSION:   Patient arrived motivated to participate in therapy session and continue addressing his balance, gait, strength, and endurance impairments to decrease his risk for falls and improve his independence with functional mobility. Patient recently participated in goal reassessment 1 week ago with pt demonstrating improvement on outcome measures assessing his B LE functional strength, gait speed, and confidence with his functional mobility. Despite improvements, patient remains in the high fall risk category, which is especially important to address with continued skilled physical therapy given patient's age. Patient's condition continues to have the potential to improve  in response to therapy and maximum improvement is yet to be obtained. The anticipated improvement is attainable and reasonable in a generally predictable time. Pt will continue to benefit from skilled therapy to address remaining deficits in order to improve overall QoL and return to PLOF.      OBJECTIVE IMPAIRMENTS: Abnormal gait, decreased activity tolerance,  decreased balance, decreased endurance, decreased mobility, and decreased strength.   ACTIVITY LIMITATIONS: carrying, lifting, bending, standing, squatting, and stairs  PARTICIPATION LIMITATIONS: cleaning, laundry, driving, shopping, community activity, and yard work  PERSONAL FACTORS: Age and 3+ comorbidities: HTN, CHF, A-FIB, DM are also affecting patient's functional outcome.   REHAB POTENTIAL: Good  CLINICAL DECISION MAKING: Evolving/moderate complexity  EVALUATION COMPLEXITY: Moderate  PLAN:  PT FREQUENCY: 1-2x/week  PT DURATION: 12 weeks  PLANNED INTERVENTIONS: [id number]- PT Re-evaluation, 97110-Therapeutic exercises, 97530- Therapeutic activity, [id number]- Neuromuscular re-education, 97535- Self Care, [id number]- Manual therapy, [id number]- Gait training, [phone]- Canalith repositioning, [phone]- Electrical stimulation (manual), Patient/Family education, Balance training, Stair training, Dry Needling, Joint mobilization, Vestibular training, DME instructions, Cryotherapy, and Moist heat  PLAN FOR NEXT SESSION:  - balance interventions including: narrow BOS, reaching/turning/rotating, eyes closed, and alternating foot taps - gait training without AD, encourage AD use also means independence (very reluctant to AD use)      Mazi Brailsford, PT, DPT, NCS, CSRS Physical Therapist - [place]  [facility]  4:18 PM 06/13/24

## 2024-06-18 ENCOUNTER — Ambulatory Visit

## 2024-06-18 DIAGNOSIS — R262 Difficulty in walking, not elsewhere classified: Secondary | ICD-10-CM

## 2024-06-18 DIAGNOSIS — R269 Unspecified abnormalities of gait and mobility: Secondary | ICD-10-CM

## 2024-06-18 DIAGNOSIS — M6281 Muscle weakness (generalized): Secondary | ICD-10-CM

## 2024-06-18 DIAGNOSIS — R2689 Other abnormalities of gait and mobility: Secondary | ICD-10-CM

## 2024-06-18 DIAGNOSIS — R278 Other lack of coordination: Secondary | ICD-10-CM

## 2024-06-18 DIAGNOSIS — R2681 Unsteadiness on feet: Secondary | ICD-10-CM

## 2024-06-18 NOTE — Therapy (Signed)
 OUTPATIENT PHYSICAL THERAPY TREATMENT   Patient Name: Joseph [name] MRN: [id number] DOB:10/17/1925, 89 y.o., male Today's Date: 06/19/2024  PCP: Dr. Charlie Forte REFERRING PROVIDER: Gustavo Igo, PA  END OF SESSION:    PT End of Session - 06/18/24 1021     Visit Number 23    Number of Visits 39    Date for PT Re-Evaluation 09/05/24    Authorization Type [facility] approval [id number] for 15 more visits (06/18/2024 is 8 of 15)    Progress Note Due on Visit 30    PT Start Time 1016    PT Stop Time 1059    PT Time Calculation (min) 43 min    Equipment Utilized During Treatment Gait belt    Activity Tolerance Patient tolerated treatment well;No increased pain    Behavior During Therapy [facility] for tasks assessed/performed             Past Medical History:  Diagnosis Date   Acute respiratory failure with hypoxia (HCC) 03/21/2016   Atrial fibrillation (HCC) 03/22/2023   CAD (coronary artery disease)    a. s/p CABG;  b. 07/2012 Cath: 3VD including 80 dLCX (med Rx), 4/4 patent grafts.   Chronic diastolic CHF (congestive heart failure) (HCC)    a. 07/2012 Echo: EF 55-60%, no rwma, Gr 1 DD, mild MR;  04/2014 Echo: EF 55-60%, no rwma, mild MR, mildly dil LA.   Diabetes mellitus, type 2 (HCC)    HTN (hypertension)    Hyperlipidemia    Hypothyroidism    Lower extremity edema    a. 03/2014 amlodipine  d/c'd.   Spinal stenosis    Stroke (HCC)    1/24 and 5/24   Past Surgical History:  Procedure Laterality Date   APPENDECTOMY     CARDIAC CATHETERIZATION  07/2012   [facility]/no stents   CHOLECYSTECTOMY     CORNEAL TRANSPLANT     CORONARY ARTERY BYPASS GRAFT  1994   HEMORRHOID SURGERY     LEFT HEART CATH AND CORS/GRAFTS ANGIOGRAPHY N/A 12/17/2019   Procedure: LEFT HEART CATH AND CORS/GRAFTS ANGIOGRAPHY;  Surgeon: Mady Bruckner, MD;  Location: [facility] INVASIVE CV LAB;  Service: Cardiovascular;  Laterality: N/A;   PROSTATE BIOPSY     TOE SURGERY     ingrown toe nail   Patient Active  Problem List   Diagnosis Date Noted   Chronic anticoagulation 04/23/2023   Stroke (HCC) 04/06/2023   Type II diabetes mellitus with renal manifestations (HCC) 04/06/2023   Chronic kidney disease, stage 3a (HCC) 04/06/2023   COPD (chronic obstructive pulmonary disease) (HCC) 04/06/2023   Chronic diastolic CHF (congestive heart failure) (HCC) 04/06/2023   Permanent atrial fibrillation (HCC) 04/06/2023   Obesity (BMI 30-39.9) 04/06/2023   Acute cerebrovascular accident (CVA) (HCC) 12/09/2022   Chronic combined systolic and diastolic CHF (congestive heart failure) (HCC) 12/09/2022   Fall at home, initial encounter 12/09/2022   Hypokalemia 12/09/2022   Hemiparesis affecting right side as late effect of cerebrovascular accident (CVA) (HCC) 12/09/2022   Skin tear of left hand without complication 11/16/2022   Pain of left hip 11/16/2022   Left hip pain 11/10/2022   Intertrigo 11/10/2022   COPD with acute exacerbation (HCC) 10/06/2022   Diabetes mellitus (HCC) 06/10/2022   Enlarged prostate 03/02/2022   Other specified counseling 03/02/2022   Renal calculi 03/02/2022   Gastroesophageal reflux disease 03/02/2022   Hypertensive disorder 03/02/2022   Mixed hyperlipidemia 03/02/2022   Hypothyroidism 03/02/2022   Acute non-recurrent frontal sinusitis 01/05/2022   Glands swollen 01/05/2022  Hypertension associated with diabetes (HCC) 01/05/2022   Acute rhinitis 01/05/2022   AV block, Mobitz 1    NSTEMI (non-ST elevated myocardial infarction) [facility])    ACS (acute coronary syndrome) (HCC) 12/16/2019   Diverticulitis of large intestine without perforation or abscess without bleeding    Pain of upper abdomen    SOB (shortness of breath)    S/P CABG (coronary artery bypass graft)    Back pain, chronic 07/28/2015   Benign fibroma of prostate 07/28/2015   Acid reflux 07/28/2015   Adult hypothyroidism 07/28/2015   Neuropathy 07/28/2015   Arthritis, degenerative 07/28/2015   Psoriasis  07/28/2015   Lumbar spondylosis 05/08/2015   Obesity 09/09/2014   Bilateral leg weakness 09/09/2014   Acute combined systolic and diastolic heart failure (HCC) 05/13/2014   Bilateral leg edema 04/15/2014   Bradycardia 08/13/2013   Edema 06/21/2012   Weakness 09/15/2011   Type 2 diabetes mellitus with diabetic peripheral angiopathy without gangrene, with long-term current use of insulin  (HCC) 09/15/2011   DYSPNEA 06/09/2009   Hyperlipidemia 06/07/2009   Essential hypertension 06/07/2009   Coronary artery disease 06/07/2009   ONSET DATE: Jan 2024  REFERRING DIAG:  R26.81 (ICD-10-CM) - Gait instability  R26.89 (ICD-10-CM) - Decreased mobility    THERAPY DIAG:  Difficulty in walking, not elsewhere classified  Abnormality of gait  Unsteadiness on feet  Muscle weakness (generalized)  Difficulty walking  Other lack of coordination  Imbalance due to old stroke  Rationale for Evaluation and Treatment: Rehabilitation  SUBJECTIVE:                                                                                                                                                                                          SUBJECTIVE STATEMENT:    Pt reports doing okay- not walking as well as he wants to.  Denies falls yet does endorse some right sided hip (just superior to IC) pain. States he took his tylenol  and Good to go.  Pt accompanied by: caregiver SUSAN  PERTINENT HISTORY: Patient reports having 2 CVA last year (1 CVA affecting right and 1 affecting left side). Prior to CVA he reports he was walking normally without an AD. States has been walking with walker ever since.   PAIN:  Are you having pain? No PRECAUTIONS: Fall WEIGHT BEARING RESTRICTIONS: No FALLS: Has patient fallen in last 6 months? Yes. Number of falls 1  LIVING ENVIRONMENT: Lives with: lives alone and has private  caregivers - everyday  and [facility] caregivers -3x/week - approx 8 hours/day Lives in:  House/apartment Stairs: has ramp supplied by [employer] Has following equipment at home: Single point cane, Walker -  2 wheeled, Wheelchair (manual), Shower bench, bed side commode, Grab bars, Ramped entry, and Transport w/c  PLOF: Independent  PATIENT GOALS: I want to walk without walker independently  OBJECTIVE:  Note: Objective measures were completed at Evaluation unless otherwise noted.  DIAGNOSTIC FINDINGS: CLINICAL DATA:  Stroke, follow up   EXAM: CT HEAD WITHOUT CONTRAST   TECHNIQUE: Contiguous axial images were obtained from the base of the skull through the vertex without intravenous contrast.   RADIATION DOSE REDUCTION: This exam was performed according to the departmental dose-optimization program which includes automated exposure control, adjustment of the mA and/or kV according to patient size and/or use of iterative reconstruction technique.   COMPARISON:  MR Brain 04/06/23, CT head 04/06/23   FINDINGS: Brain: No hemorrhage. No hydrocephalus. There are prominent subarachnoid spaces bilaterally, unchanged. There is sequela of moderate chronic microvascular ischemic change with a chronic infarct in the left corona radiata and an acute infarct in the right basal ganglia, better characterized on recent brain MRI.   Vascular: No hyperdense vessel or unexpected calcification.   Skull: Normal. Negative for fracture or focal lesion.   Sinuses/Orbits: No mastoid or middle ear effusion. Paranasal sinuses are clear. Bilateral lens replacement. Orbits are otherwise unremarkable.   Other: None.   IMPRESSION: 1. No acute intracranial hemorrhage. 2. Acute infarct in the right basal ganglia, better characterized on recent brain MRI    Electronically Signed   By: Lyndall Gore M.D.   On: 04/07/2023 16:08  COGNITION: Overall cognitive status: Within functional limits for tasks assessed EDEMA:  Some edema noted with lower left LE  LOWER EXTREMITY MMT:    MMT Right Eval  Left Eval  Hip flexion 4 4  Hip extension    Hip abduction    Hip adduction    Hip internal rotation 4 4  Hip external rotation 4 4  Knee flexion 4 4  Knee extension 4 4  Ankle dorsiflexion 4 4  Ankle plantarflexion    Ankle inversion    Ankle eversion    (Blank rows = not tested)  TRANSFERS: Assistive device utilized: Environmental consultant - 2 wheeled  Sit to stand: CGA Stand to sit: CGA Chair to chair: CGA Floor: not tested  GAIT: Gait pattern: decreased step length- Right and decreased step length- Left Distance walked: > 100 feet  Assistive device utilized: Walker - 2 wheeled Level of assistance: CGA Comments: anterior trunk lean  FUNCTIONAL TESTS:  5 times sit to stand: 22.32 sec without UE support Timed up and go (TUG): 27.94 sec  6 minute walk test: To be assessed visit # 2 10 meter walk test: 16.10 and 14.92 sec with RW=15.51 sec avg or 0.65 m/s using RW  Berg Balance Scale: To be assessed visit #2  PATIENT SURVEYS:  Stroke Impact Scale 52 ABC scale 37.5 %  TREATMENT DATE: 06/18/24   Pt arrives to therapy session in transport chair.   TA:   Step tap onto 5  block at support bar with BUE Support x 10 ea LE Step up tap onto 5  block at support bar with BUE Support x 10 ea LE Sit<>stands, no UE support, from green chair with CGA for balance  with only 1 instance of posterior lean/LOB - cuing for increased anterior trunk lean - x10reps  NMR: Step tap onto 5 block at support bar without UE support x 12 ea LE Fwd/bwd step up/over 1/2 foam roll with 1 UE support x 15 reps ea Side step up/over 1/2 foam roll with 1 UE support x 15 reps alt LE  Gait training ~78ft  x 3 rounds without AD with CGA/light min assist for steadying. Continues to demo wide BOS, decreased step lengths bilaterally, excessive forward trunk flexed posture, and slow gait speed.   Vitals after: HR 78bpm and SpO2 96%   Therapist providing appropriate seated rest breaks throughout session.       PATIENT EDUCATION: Education details: exercise technique Person educated: Patient,  Education method: Explanation Education comprehension: verbalized understanding  HOME EXERCISE PROGRAM:  Access Code: [email or web address] URL: [email or web address] Date: 01/25/2024 Prepared by: Connell Kiss  Exercises - Sit to Stand with Counter Support  - 1 x daily - 7 x weekly - 2 sets - 10 reps - Narrow Stance with Counter Support  - 1 x daily - 7 x weekly - 2 sets - 30 seconds hold - Narrow Stance with Head Nods and Counter Support  - 1 x daily - 7 x weekly - 2 sets - 10 reps  Access Code: [email or web address] URL: [email or web address] Date: 05/15/2024 Prepared by: Marina  [name]  Exercises - Sit to Stand with Counter Support  - 1 x daily - 7 x weekly - 2 sets - 10 reps - Narrow Stance with Counter Support  - 1 x daily - 7 x weekly - 2 sets - 30 seconds hold - Narrow Stance with Head Nods and Counter Support  - 1 x daily - 7 x weekly - 2 sets - 10 reps - Standing March with Counter Support  - 1 x daily - 7 x weekly - 2 sets - 10 reps - 5 hold - Leg Extension  - 1 x daily - 7 x weekly - 2 sets - 10 reps - 5 hold - Seated Single Leg Hip Abduction with Resistance  - 1 x daily - 7 x weekly - 2 sets - 10 reps - 5 hold  GOALS: Goals reviewed with patient? Yes  SHORT TERM GOALS: Target date: 07/25/2024  Pt will be independent with HEP in order to improve strength and balance in order to decrease fall risk and improve function at home and work.  Baseline: EVAL: no formal HEP in place 4/8: Completing 5 days per week 06/13/2024: pt reports consistent compliance with HEP Goal status: MET  LONG TERM GOALS: Target date: 09/05/2024    1.  Patient (> 10 years old) will complete five times sit to stand test in < 15 seconds indicating an increased LE strength and improved  balance. Baseline: EVAL: 22.32 sec without UE support 02/27/24: 22.81 sec no UE support;  03/21/2024= 16.17 sec without UE Support 06/06/24: 19.39 sec without UE support Goal status: PROGRESSING  2.  Pt will improve ABC by at least 13% in order to demonstrate clinically significant improvement in balance confidence Baseline: EVAL: 37.5% 05/09/24: 70%, question validity  compared to prior assessment.  06/06/24: 53.125% Goal status: IN PROGRESS   3.  Patient will increase Berg Balance score by > 6 points to demonstrate decreased fall risk during functional activities. Baseline: 01/25/24: 25/56 02/27/24: 31 05/09/24: 33/56  06/11/24: 29/56 Goal status: IN PROGRESS   4.  Patient will reduce timed up and go to <11 seconds to reduce fall risk and demonstrate improved transfer/gait ability. Baseline: EVAL: 27.94 with RW 4/8: 35.58 sec with RW; 03/21/24= 26.88 sec 06/06/24: 34.06 sec Goal status: PROGRESSING  5.  Patient will increase 10 meter walk test to >1.42m/s as to improve gait speed for better community ambulation and to reduce fall risk. Baseline: EVAL: 0.65 m/s 02/27/24: .43 m/s with RW; 03/21/24= 0.71 m/s using RW 6/19: 0.31m/s using RW 06/06/24: 0.64 m/s with RW Goal status: PROGRESSING   6.  Patient will increase six minute walk test distance to >1000 for progression to community ambulator and improve gait ability Baseline: EVAL: To be assessed  visit #2 4/8: 600 ft; 03/21/24: 600 feet in 5 min using RW- stopped due to fatigue. 06/06/24: 438' using the RW Goal status: PROGRESSING  7.  Patient will improve score on Stroke Impact Scale 16 by 8 points to signify a higher level of functioning and less difficulty with daily activities.  Baseline: EVAL: 52  02/27/24:55;  03/21/2024: 64 06/06/24: 66 Goal status: PROGRESSING   ASSESSMENT:  CLINICAL IMPRESSION:  Treatment focused on BLE Strengthening, balance and functional mobility today. Overall, patient performed well- limited mostly by impaired balance  and general fatigue with increased activity. He was provided adequate recovery due to some fatigue and SOB- with known A-FIB. He was able to walk with very close CGA and remains high risk of falling. He did show some adaptation with balance activities - able to perform step tap without UE support with some practice. He was more steady on 2nd round of walking yet still overall unsteady- mostly with turning and remains an increased fall risk and encouraged to continue to use RW for all mobility at home. Pt will continue to benefit from skilled therapy to address remaining deficits in order to improve overall QoL and return to PLOF.      OBJECTIVE IMPAIRMENTS: Abnormal gait, decreased activity tolerance, decreased balance, decreased endurance, decreased mobility, and decreased strength.   ACTIVITY LIMITATIONS: carrying, lifting, bending, standing, squatting, and stairs  PARTICIPATION LIMITATIONS: cleaning, laundry, driving, shopping, community activity, and yard work  PERSONAL FACTORS: Age and 3+ comorbidities: HTN, CHF, A-FIB, DM are also affecting patient's functional outcome.   REHAB POTENTIAL: Good  CLINICAL DECISION MAKING: Evolving/moderate complexity  EVALUATION COMPLEXITY: Moderate  PLAN:  PT FREQUENCY: 1-2x/week  PT DURATION: 12 weeks  PLANNED INTERVENTIONS: 97164- PT Re-evaluation, 97110-Therapeutic exercises, 97530- Therapeutic activity, [id number]- Neuromuscular re-education, 97535- Self Care, [id number]- Manual therapy, [id number]- Gait training, [phone]- Canalith repositioning, [phone]- Electrical stimulation (manual), Patient/Family education, Balance training, Stair training, Dry Needling, Joint mobilization, Vestibular training, DME instructions, Cryotherapy, and Moist heat  PLAN FOR NEXT SESSION:  - balance interventions including: narrow BOS, reaching/turning/rotating, eyes closed, and alternating foot taps - gait training without AD, encourage AD use also means independence (very reluctant  to AD use)      Carly Pippin, PT, DPT, NCS, CSRS Physical Therapist - [facility]  [facility]  8:22 AM 06/19/24

## 2024-06-19 ENCOUNTER — Telehealth: Payer: Self-pay | Admitting: Cardiovascular Disease

## 2024-06-19 NOTE — Telephone Encounter (Signed)
 Patient dropped off Medical Orders for Scope of Treatment form, placed in nurse box.

## 2024-06-20 ENCOUNTER — Ambulatory Visit

## 2024-06-21 NOTE — Telephone Encounter (Signed)
 Form given to Dr. Gollan for review.

## 2024-06-24 NOTE — Therapy (Signed)
 OUTPATIENT PHYSICAL THERAPY TREATMENT   Patient Name: Joseph [name] MRN: [id number] DOB:[birth year], 89 y.o., male Today's Date: 06/25/2024  PCP: Dr. Charlie [name] REFERRING PROVIDER: Gustavo Igo, PA  END OF SESSION:    PT End of Session - 06/25/24 1103     Visit Number 24    Number of Visits 39    Date for PT Re-Evaluation 09/05/24    Authorization Type [facility] approval [id number] for 15 more visits (06/18/2024 is 8 of 15)    Progress Note Due on Visit 30    PT Start Time 1103    PT Stop Time 1145    PT Time Calculation (min) 42 min    Equipment Utilized During Treatment Gait belt    Activity Tolerance Patient tolerated treatment well;No increased pain    Behavior During Therapy [facility] for tasks assessed/performed           Past Medical History:  Diagnosis Date   Acute respiratory failure with hypoxia (HCC) 03/21/2016   Atrial fibrillation (HCC) 03/22/2023   CAD (coronary artery disease)    a. s/p CABG;  b. 07/2012 Cath: 3VD including 80 dLCX (med Rx), 4/4 patent grafts.   Chronic diastolic CHF (congestive heart failure) (HCC)    a. 07/2012 Echo: EF 55-60%, no rwma, Gr 1 DD, mild MR;  04/2014 Echo: EF 55-60%, no rwma, mild MR, mildly dil LA.   Diabetes mellitus, type 2 (HCC)    HTN (hypertension)    Hyperlipidemia    Hypothyroidism    Lower extremity edema    a. 03/2014 amlodipine  d/c'd.   Spinal stenosis    Stroke (HCC)    1/24 and 5/24   Past Surgical History:  Procedure Laterality Date   APPENDECTOMY     CARDIAC CATHETERIZATION  07/2012   [facility]/no stents   CHOLECYSTECTOMY     CORNEAL TRANSPLANT     CORONARY ARTERY BYPASS GRAFT  1994   HEMORRHOID SURGERY     LEFT HEART CATH AND CORS/GRAFTS ANGIOGRAPHY N/A 12/17/2019   Procedure: LEFT HEART CATH AND CORS/GRAFTS ANGIOGRAPHY;  Surgeon: Joseph Bruckner, MD;  Location: [facility] INVASIVE CV LAB;  Service: Cardiovascular;  Laterality: N/A;   PROSTATE BIOPSY     TOE SURGERY     ingrown toe nail   Patient Active Problem  List   Diagnosis Date Noted   Chronic anticoagulation 04/23/2023   Stroke (HCC) 04/06/2023   Type II diabetes mellitus with renal manifestations (HCC) 04/06/2023   Chronic kidney disease, stage 3a (HCC) 04/06/2023   COPD (chronic obstructive pulmonary disease) (HCC) 04/06/2023   Chronic diastolic CHF (congestive heart failure) (HCC) 04/06/2023   Permanent atrial fibrillation (HCC) 04/06/2023   Obesity (BMI 30-39.9) 04/06/2023   Acute cerebrovascular accident (CVA) (HCC) 12/09/2022   Chronic combined systolic and diastolic CHF (congestive heart failure) (HCC) 12/09/2022   Fall at home, initial encounter 12/09/2022   Hypokalemia 12/09/2022   Hemiparesis affecting right side as late effect of cerebrovascular accident (CVA) (HCC) 12/09/2022   Skin tear of left hand without complication 11/16/2022   Pain of left hip 11/16/2022   Left hip pain 11/10/2022   Intertrigo 11/10/2022   COPD with acute exacerbation (HCC) 10/06/2022   Diabetes mellitus (HCC) 06/10/2022   Enlarged prostate 03/02/2022   Other specified counseling 03/02/2022   Renal calculi 03/02/2022   Gastroesophageal reflux disease 03/02/2022   Hypertensive disorder 03/02/2022   Mixed hyperlipidemia 03/02/2022   Hypothyroidism 03/02/2022   Acute non-recurrent frontal sinusitis 01/05/2022   Glands swollen 01/05/2022  Hypertension associated with diabetes (HCC) 01/05/2022   Acute rhinitis 01/05/2022   AV block, Mobitz 1    NSTEMI (non-ST elevated myocardial infarction) [facility])    ACS (acute coronary syndrome) (HCC) 12/16/2019   Diverticulitis of large intestine without perforation or abscess without bleeding    Pain of upper abdomen    SOB (shortness of breath)    S/P CABG (coronary artery bypass graft)    Back pain, chronic 07/28/2015   Benign fibroma of prostate 07/28/2015   Acid reflux 07/28/2015   Adult hypothyroidism 07/28/2015   Neuropathy 07/28/2015   Arthritis, degenerative 07/28/2015   Psoriasis 07/28/2015    Lumbar spondylosis 05/08/2015   Obesity 09/09/2014   Bilateral leg weakness 09/09/2014   Acute combined systolic and diastolic heart failure (HCC) 05/13/2014   Bilateral leg edema 04/15/2014   Bradycardia 08/13/2013   Edema 06/21/2012   Weakness 09/15/2011   Type 2 diabetes mellitus with diabetic peripheral angiopathy without gangrene, with long-term current use of insulin  (HCC) 09/15/2011   DYSPNEA 06/09/2009   Hyperlipidemia 06/07/2009   Essential hypertension 06/07/2009   Coronary artery disease 06/07/2009   ONSET DATE: Jan 2024  REFERRING DIAG:  R26.81 (ICD-10-CM) - Gait instability  R26.89 (ICD-10-CM) - Decreased mobility    THERAPY DIAG:  Difficulty in walking, not elsewhere classified  Abnormality of gait  Unsteadiness on feet  Muscle weakness (generalized)  Difficulty walking  Other lack of coordination  Imbalance due to old stroke  Rationale for Evaluation and Treatment: Rehabilitation  SUBJECTIVE:                                                                                                                                                                                          SUBJECTIVE STATEMENT:    Pt reports not doing much, but has been doing his HEP.  Pt reports he is [name] to miss the next couple of weeks due to having an eye surgery.    Pt accompanied by: caregiver Joseph [name]  PERTINENT HISTORY: Patient reports having 2 CVA last year (1 CVA affecting right and 1 affecting left side). Prior to CVA he reports he was walking normally without an AD. States has been walking with walker ever since.   PAIN:  Are you having pain? No PRECAUTIONS: Fall WEIGHT BEARING RESTRICTIONS: No FALLS: Has patient fallen in last 6 months? Yes. Number of falls 1  LIVING ENVIRONMENT: Lives with: lives alone and has private  caregivers - everyday  and [facility] caregivers -3x/week - approx 8 hours/day Lives in: House/apartment Stairs: has ramp supplied by [employer] Has following  equipment at home: Single point cane, Environmental consultant - 2 wheeled, Wheelchair (  manual), Shower bench, bed side commode, Grab bars, Ramped entry, and Transport w/c  PLOF: Independent  PATIENT GOALS: I want to walk without walker independently  OBJECTIVE:  Note: Objective measures were completed at Evaluation unless otherwise noted.  DIAGNOSTIC FINDINGS: CLINICAL DATA:  Stroke, follow up   EXAM: CT HEAD WITHOUT CONTRAST   TECHNIQUE: Contiguous axial images were obtained from the base of the skull through the vertex without intravenous contrast.   RADIATION DOSE REDUCTION: This exam was performed according to the departmental dose-optimization program which includes automated exposure control, adjustment of the mA and/or kV according to patient size and/or use of iterative reconstruction technique.   COMPARISON:  MR Brain 04/06/23, CT head 04/06/23   FINDINGS: Brain: No hemorrhage. No hydrocephalus. There are prominent subarachnoid spaces bilaterally, unchanged. There is sequela of moderate chronic microvascular ischemic change with a chronic infarct in the left corona radiata and an acute infarct in the right basal ganglia, better characterized on recent brain MRI.   Vascular: No hyperdense vessel or unexpected calcification.   Skull: Normal. Negative for fracture or focal lesion.   Sinuses/Orbits: No mastoid or middle ear effusion. Paranasal sinuses are clear. Bilateral lens replacement. Orbits are otherwise unremarkable.   Other: None.   IMPRESSION: 1. No acute intracranial hemorrhage. 2. Acute infarct in the right basal ganglia, better characterized on recent brain MRI    Electronically Signed   By: Lyndall Gore M.D.   On: 04/07/2023 16:08  COGNITION: Overall cognitive status: Within functional limits for tasks assessed EDEMA:  Some edema noted with lower left LE  LOWER EXTREMITY MMT:    MMT Right Eval Left Eval  Hip flexion 4 4  Hip extension    Hip abduction     Hip adduction    Hip internal rotation 4 4  Hip external rotation 4 4  Knee flexion 4 4  Knee extension 4 4  Ankle dorsiflexion 4 4  Ankle plantarflexion    Ankle inversion    Ankle eversion    (Blank rows = not tested)  TRANSFERS: Assistive device utilized: Environmental consultant - 2 wheeled  Sit to stand: CGA Stand to sit: CGA Chair to chair: CGA Floor: not tested  GAIT: Gait pattern: decreased step length- Right and decreased step length- Left Distance walked: > 100 feet  Assistive device utilized: Walker - 2 wheeled Level of assistance: CGA Comments: anterior trunk lean  FUNCTIONAL TESTS:  5 times sit to stand: 22.32 sec without UE support Timed up and go (TUG): 27.94 sec  6 minute walk test: To be assessed visit # 2 10 meter walk test: 16.10 and 14.92 sec with RW=15.51 sec avg or 0.65 m/s using RW  Berg Balance Scale: To be assessed visit #2  PATIENT SURVEYS:  Stroke Impact Scale 52 ABC scale 37.5 %  TREATMENT DATE: 06/18/24   Pt arrives to therapy session in transport chair.   TA:   Step tap up onto 5  block at support bar with L UE Support 2x10 ea LE  STS 5x with B UE support, x2 with L UE support, and x3 without any UE support  STS without any UE support, x5 more attempts  Transfer training from support bar to the walker, x10 total attempts with UE support on chair and verbal cues for proper form and making sure both LE's are touching the chair before sitting.     NMR:  Step tap onto 5 block at support bar without UE support x 12 ea LE Fwd/bwd step up/over 1/2 foam roll with 1 UE support x 15 reps ea Side step up/over 1/2 foam roll with 1 UE support x 15 reps alt LE Rockerboard lateral shifts, 2x10 each direction Rockerboard forward/retro shits, 2x10 each direction   Therapist providing appropriate seated rest breaks throughout  session.       PATIENT EDUCATION: Education details: exercise technique Person educated: Patient,  Education method: Explanation Education comprehension: verbalized understanding  HOME EXERCISE PROGRAM:  Access Code: [email or web address] URL: [email or web address] Date: 01/25/2024 Prepared by: Connell Kiss  Exercises - Sit to Stand with Counter Support  - 1 x daily - 7 x weekly - 2 sets - 10 reps - Narrow Stance with Counter Support  - 1 x daily - 7 x weekly - 2 sets - 30 seconds hold - Narrow Stance with Head Nods and Counter Support  - 1 x daily - 7 x weekly - 2 sets - 10 reps  Access Code: [email or web address] URL: [email or web address] Date: 05/15/2024 Prepared by: Marina  [name]  Exercises - Sit to Stand with Counter Support  - 1 x daily - 7 x weekly - 2 sets - 10 reps - Narrow Stance with Counter Support  - 1 x daily - 7 x weekly - 2 sets - 30 seconds hold - Narrow Stance with Head Nods and Counter Support  - 1 x daily - 7 x weekly - 2 sets - 10 reps - Standing March with Counter Support  - 1 x daily - 7 x weekly - 2 sets - 10 reps - 5 hold - Leg Extension  - 1 x daily - 7 x weekly - 2 sets - 10 reps - 5 hold - Seated Single Leg Hip Abduction with Resistance  - 1 x daily - 7 x weekly - 2 sets - 10 reps - 5 hold  GOALS: Goals reviewed with patient? Yes  SHORT TERM GOALS: Target date: 07/25/2024  Pt will be independent with HEP in order to improve strength and balance in order to decrease fall risk and improve function at home and work.  Baseline: EVAL: no formal HEP in place 4/8: Completing 5 days per week 06/13/2024: pt reports consistent compliance with HEP Goal status: MET  LONG TERM GOALS: Target date: 09/05/2024    1.  Patient (> 30 years old) will complete five times sit to stand test in < 15 seconds indicating an increased LE strength and improved balance. Baseline: EVAL: 22.32 sec without UE support 02/27/24: 22.81 sec no UE support;  03/21/2024= 16.17 sec  without UE Support 06/06/24: 19.39 sec without UE support Goal status: PROGRESSING  2.  Pt will improve ABC by at least 13% in order to demonstrate clinically significant improvement in balance confidence Baseline: EVAL: 37.5% 05/09/24: 70%, question validity compared to prior assessment.  06/06/24: 53.125% Goal status:  IN PROGRESS   3.  Patient will increase Berg Balance score by > 6 points to demonstrate decreased fall risk during functional activities. Baseline: 01/25/24: 25/56 02/27/24: 31 05/09/24: 33/56  06/11/24: 29/56 Goal status: IN PROGRESS   4.  Patient will reduce timed up and go to <11 seconds to reduce fall risk and demonstrate improved transfer/gait ability. Baseline: EVAL: 27.94 with RW 4/8: 35.58 sec with RW; 03/21/24= 26.88 sec 06/06/24: 34.06 sec Goal status: PROGRESSING  5.  Patient will increase 10 meter walk test to >1.29m/s as to improve gait speed for better community ambulation and to reduce fall risk. Baseline: EVAL: 0.65 m/s 02/27/24: .43 m/s with RW; 03/21/24= 0.71 m/s using RW 6/19: 0.57m/s using RW 06/06/24: 0.64 m/s with RW Goal status: PROGRESSING   6.  Patient will increase six minute walk test distance to >1000 for progression to community ambulator and improve gait ability Baseline: EVAL: To be assessed  visit #2 4/8: 600 ft; 03/21/24: 600 feet in 5 min using RW- stopped due to fatigue. 06/06/24: 438' using the RW Goal status: PROGRESSING  7.  Patient will improve score on Stroke Impact Scale 16 by 8 points to signify a higher level of functioning and less difficulty with daily activities.  Baseline: EVAL: 52  02/27/24:55;  03/21/2024: 64 06/06/24: 66 Goal status: PROGRESSING   ASSESSMENT:  CLINICAL IMPRESSION:    Pt performed well with the exercises and felt the challenge of the slant board exercises the most.  Frequent seated rest breaks utilized due to pt being SOB, however monitored throughout the session and found to be >92% throughout the session.  Pt  struggled with the balance training on the slant board and is unable to perform without some form of UE support regardless of doing lateral or forward/retro weight shifts.  Will continue to challenge foot proprioception in order to improve balance.   Pt will continue to benefit from skilled therapy to address remaining deficits in order to improve overall QoL and return to PLOF.      OBJECTIVE IMPAIRMENTS: Abnormal gait, decreased activity tolerance, decreased balance, decreased endurance, decreased mobility, and decreased strength.   ACTIVITY LIMITATIONS: carrying, lifting, bending, standing, squatting, and stairs  PARTICIPATION LIMITATIONS: cleaning, laundry, driving, shopping, community activity, and yard work  PERSONAL FACTORS: Age and 3+ comorbidities: HTN, CHF, A-FIB, DM are also affecting patient's functional outcome.   REHAB POTENTIAL: Good  CLINICAL DECISION MAKING: Evolving/moderate complexity  EVALUATION COMPLEXITY: Moderate  PLAN:  PT FREQUENCY: 1-2x/week  PT DURATION: 12 weeks  PLANNED INTERVENTIONS: 97164- PT Re-evaluation, 97110-Therapeutic exercises, 97530- Therapeutic activity, 97112- Neuromuscular re-education, 97535- Self Care, [id number]- Manual therapy, [phone]- Gait training, [phone]- Canalith repositioning, [phone]- Electrical stimulation (manual), Patient/Family education, Balance training, Stair training, Dry Needling, Joint mobilization, Vestibular training, DME instructions, Cryotherapy, and Moist heat  PLAN FOR NEXT SESSION:   - balance interventions including: narrow BOS, reaching/turning/rotating, eyes closed, and alternating foot taps - gait training without AD, encourage AD use also means independence (very reluctant to AD use)     Fonda Simpers, PT, DPT Physical Therapist - [facility]  06/25/24, 2:05 PM

## 2024-06-25 ENCOUNTER — Ambulatory Visit: Attending: Physician Assistant

## 2024-06-25 DIAGNOSIS — R262 Difficulty in walking, not elsewhere classified: Secondary | ICD-10-CM | POA: Diagnosis present

## 2024-06-25 DIAGNOSIS — R2689 Other abnormalities of gait and mobility: Secondary | ICD-10-CM | POA: Diagnosis present

## 2024-06-25 DIAGNOSIS — R278 Other lack of coordination: Secondary | ICD-10-CM | POA: Insufficient documentation

## 2024-06-25 DIAGNOSIS — I69398 Other sequelae of cerebral infarction: Secondary | ICD-10-CM | POA: Diagnosis present

## 2024-06-25 DIAGNOSIS — R269 Unspecified abnormalities of gait and mobility: Secondary | ICD-10-CM | POA: Diagnosis present

## 2024-06-25 DIAGNOSIS — M6281 Muscle weakness (generalized): Secondary | ICD-10-CM | POA: Diagnosis present

## 2024-06-25 DIAGNOSIS — R2681 Unsteadiness on feet: Secondary | ICD-10-CM | POA: Insufficient documentation

## 2024-06-27 ENCOUNTER — Ambulatory Visit: Admitting: Physical Therapy

## 2024-07-02 ENCOUNTER — Ambulatory Visit

## 2024-07-04 ENCOUNTER — Ambulatory Visit

## 2024-07-09 ENCOUNTER — Ambulatory Visit: Admitting: Physical Therapy

## 2024-07-11 ENCOUNTER — Ambulatory Visit

## 2024-07-16 ENCOUNTER — Ambulatory Visit: Admitting: Physical Therapy

## 2024-07-18 ENCOUNTER — Ambulatory Visit

## 2024-07-23 ENCOUNTER — Ambulatory Visit: Admitting: Physical Therapy

## 2024-07-25 ENCOUNTER — Ambulatory Visit: Attending: Physician Assistant | Admitting: Physical Therapy

## 2024-07-25 ENCOUNTER — Ambulatory Visit

## 2024-07-25 DIAGNOSIS — R262 Difficulty in walking, not elsewhere classified: Secondary | ICD-10-CM | POA: Diagnosis present

## 2024-07-25 DIAGNOSIS — R269 Unspecified abnormalities of gait and mobility: Secondary | ICD-10-CM | POA: Diagnosis present

## 2024-07-25 DIAGNOSIS — R278 Other lack of coordination: Secondary | ICD-10-CM | POA: Insufficient documentation

## 2024-07-25 DIAGNOSIS — R2681 Unsteadiness on feet: Secondary | ICD-10-CM | POA: Insufficient documentation

## 2024-07-25 DIAGNOSIS — M6281 Muscle weakness (generalized): Secondary | ICD-10-CM | POA: Insufficient documentation

## 2024-07-25 NOTE — Therapy (Signed)
 OUTPATIENT PHYSICAL THERAPY TREATMENT   Patient Name: Joseph [name] MRN: [id number] DOB:08/30/1925, 89 y.o., male Today's Date: 07/25/2024  PCP: Dr. Charlie Forte REFERRING PROVIDER: Gustavo Igo, PA  END OF SESSION:    PT End of Session - 07/25/24 1323     Visit Number 25    Number of Visits 39    Date for PT Re-Evaluation 09/05/24    Authorization Type [facility] approval [id number] for 15 more visits (06/18/2024 is 8 of 15)    Progress Note Due on Visit 30    PT Start Time 1319    PT Stop Time 1358    PT Time Calculation (min) 39 min    Equipment Utilized During Treatment Gait belt    Activity Tolerance Patient tolerated treatment well;No increased pain    Behavior During Therapy [facility] for tasks assessed/performed           Past Medical History:  Diagnosis Date   Acute respiratory failure with hypoxia (HCC) 03/21/2016   Atrial fibrillation (HCC) 03/22/2023   CAD (coronary artery disease)    a. s/p CABG;  b. 07/2012 Cath: 3VD including 80 dLCX (med Rx), 4/4 patent grafts.   Chronic diastolic CHF (congestive heart failure) (HCC)    a. 07/2012 Echo: EF 55-60%, no rwma, Gr 1 DD, mild MR;  04/2014 Echo: EF 55-60%, no rwma, mild MR, mildly dil LA.   Diabetes mellitus, type 2 (HCC)    HTN (hypertension)    Hyperlipidemia    Hypothyroidism    Lower extremity edema    a. 03/2014 amlodipine  d/c'd.   Spinal stenosis    Stroke (HCC)    1/24 and 5/24   Past Surgical History:  Procedure Laterality Date   APPENDECTOMY     CARDIAC CATHETERIZATION  07/2012   [facility]/no stents   CHOLECYSTECTOMY     CORNEAL TRANSPLANT     CORONARY ARTERY BYPASS GRAFT  1994   HEMORRHOID SURGERY     LEFT HEART CATH AND CORS/GRAFTS ANGIOGRAPHY N/A 12/17/2019   Procedure: LEFT HEART CATH AND CORS/GRAFTS ANGIOGRAPHY;  Surgeon: Mady Bruckner, MD;  Location: [facility] INVASIVE CV LAB;  Service: Cardiovascular;  Laterality: N/A;   PROSTATE BIOPSY     TOE SURGERY     ingrown toe nail   Patient Active Problem  List   Diagnosis Date Noted   Chronic anticoagulation 04/23/2023   Stroke (HCC) 04/06/2023   Type II diabetes mellitus with renal manifestations (HCC) 04/06/2023   Chronic kidney disease, stage 3a (HCC) 04/06/2023   COPD (chronic obstructive pulmonary disease) (HCC) 04/06/2023   Chronic diastolic CHF (congestive heart failure) (HCC) 04/06/2023   Permanent atrial fibrillation (HCC) 04/06/2023   Obesity (BMI 30-39.9) 04/06/2023   Acute cerebrovascular accident (CVA) (HCC) 12/09/2022   Chronic combined systolic and diastolic CHF (congestive heart failure) (HCC) 12/09/2022   Fall at home, initial encounter 12/09/2022   Hypokalemia 12/09/2022   Hemiparesis affecting right side as late effect of cerebrovascular accident (CVA) (HCC) 12/09/2022   Skin tear of left hand without complication 11/16/2022   Pain of left hip 11/16/2022   Left hip pain 11/10/2022   Intertrigo 11/10/2022   COPD with acute exacerbation (HCC) 10/06/2022   Diabetes mellitus (HCC) 06/10/2022   Enlarged prostate 03/02/2022   Other specified counseling 03/02/2022   Renal calculi 03/02/2022   Gastroesophageal reflux disease 03/02/2022   Hypertensive disorder 03/02/2022   Mixed hyperlipidemia 03/02/2022   Hypothyroidism 03/02/2022   Acute non-recurrent frontal sinusitis 01/05/2022   Glands swollen 01/05/2022  Hypertension associated with diabetes (HCC) 01/05/2022   Acute rhinitis 01/05/2022   AV block, Mobitz 1    NSTEMI (non-ST elevated myocardial infarction) [facility])    ACS (acute coronary syndrome) (HCC) 12/16/2019   Diverticulitis of large intestine without perforation or abscess without bleeding    Pain of upper abdomen    SOB (shortness of breath)    S/P CABG (coronary artery bypass graft)    Back pain, chronic 07/28/2015   Benign fibroma of prostate 07/28/2015   Acid reflux 07/28/2015   Adult hypothyroidism 07/28/2015   Neuropathy 07/28/2015   Arthritis, degenerative 07/28/2015   Psoriasis 07/28/2015    Lumbar spondylosis 05/08/2015   Obesity 09/09/2014   Bilateral leg weakness 09/09/2014   Acute combined systolic and diastolic heart failure (HCC) 05/13/2014   Bilateral leg edema 04/15/2014   Bradycardia 08/13/2013   Edema 06/21/2012   Weakness 09/15/2011   Type 2 diabetes mellitus with diabetic peripheral angiopathy without gangrene, with long-term current use of insulin  (HCC) 09/15/2011   DYSPNEA 06/09/2009   Hyperlipidemia 06/07/2009   Essential hypertension 06/07/2009   Coronary artery disease 06/07/2009   ONSET DATE: Jan 2024  REFERRING DIAG:  R26.81 (ICD-10-CM) - Gait instability  R26.89 (ICD-10-CM) - Decreased mobility    THERAPY DIAG:  Difficulty in walking, not elsewhere classified  Abnormality of gait  Unsteadiness on feet  Muscle weakness (generalized)  Difficulty walking  Rationale for Evaluation and Treatment: Rehabilitation  SUBJECTIVE:                                                                                                                                                                                          SUBJECTIVE STATEMENT:    Patient is coming back from prolonged stent absent since he was instructed to perform lots of rest after a procedure with his eye.  Patient has another procedure scheduled tomorrow will likely need another longer period of rest.  Pt accompanied by: caregiver Devere  PERTINENT HISTORY: Patient reports having 2 CVA last year (1 CVA affecting right and 1 affecting left side). Prior to CVA he reports he was walking normally without an AD. States has been walking with walker ever since.   PAIN:  Are you having pain? No PRECAUTIONS: Fall WEIGHT BEARING RESTRICTIONS: No FALLS: Has patient fallen in last 6 months? Yes. Number of falls 1  LIVING ENVIRONMENT: Lives with: lives alone and has private  caregivers - everyday  and [facility] caregivers -3x/week - approx 8 hours/day Lives in: House/apartment Stairs: has ramp supplied  by [employer] Has following equipment at home: Single point cane, Environmental consultant - 2 wheeled, Wheelchair (manual), Shower bench, bed side  commode, Grab bars, Ramped entry, and Transport w/c  PLOF: Independent  PATIENT GOALS: I want to walk without walker independently  OBJECTIVE:  Note: Objective measures were completed at Evaluation unless otherwise noted.  DIAGNOSTIC FINDINGS: CLINICAL DATA:  Stroke, follow up   EXAM: CT HEAD WITHOUT CONTRAST   TECHNIQUE: Contiguous axial images were obtained from the base of the skull through the vertex without intravenous contrast.   RADIATION DOSE REDUCTION: This exam was performed according to the departmental dose-optimization program which includes automated exposure control, adjustment of the mA and/or kV according to patient size and/or use of iterative reconstruction technique.   COMPARISON:  MR Brain 04/06/23, CT head 04/06/23   FINDINGS: Brain: No hemorrhage. No hydrocephalus. There are prominent subarachnoid spaces bilaterally, unchanged. There is sequela of moderate chronic microvascular ischemic change with a chronic infarct in the left corona radiata and an acute infarct in the right basal ganglia, better characterized on recent brain MRI.   Vascular: No hyperdense vessel or unexpected calcification.   Skull: Normal. Negative for fracture or focal lesion.   Sinuses/Orbits: No mastoid or middle ear effusion. Paranasal sinuses are clear. Bilateral lens replacement. Orbits are otherwise unremarkable.   Other: None.   IMPRESSION: 1. No acute intracranial hemorrhage. 2. Acute infarct in the right basal ganglia, better characterized on recent brain MRI    Electronically Signed   By: Lyndall Gore M.D.   On: 04/07/2023 16:08  COGNITION: Overall cognitive status: Within functional limits for tasks assessed EDEMA:  Some edema noted with lower left LE  LOWER EXTREMITY MMT:    MMT Right Eval Left Eval  Hip flexion 4 4  Hip extension     Hip abduction    Hip adduction    Hip internal rotation 4 4  Hip external rotation 4 4  Knee flexion 4 4  Knee extension 4 4  Ankle dorsiflexion 4 4  Ankle plantarflexion    Ankle inversion    Ankle eversion    (Blank rows = not tested)  TRANSFERS: Assistive device utilized: Environmental consultant - 2 wheeled  Sit to stand: CGA Stand to sit: CGA Chair to chair: CGA Floor: not tested  GAIT: Gait pattern: decreased step length- Right and decreased step length- Left Distance walked: > 100 feet  Assistive device utilized: Walker - 2 wheeled Level of assistance: CGA Comments: anterior trunk lean  FUNCTIONAL TESTS:  5 times sit to stand: 22.32 sec without UE support Timed up and go (TUG): 27.94 sec  6 minute walk test: To be assessed visit # 2 10 meter walk test: 16.10 and 14.92 sec with RW=15.51 sec avg or 0.65 m/s using RW  Berg Balance Scale: To be assessed visit #2  PATIENT SURVEYS:  Stroke Impact Scale 52 ABC scale 37.5 %  TREATMENT DATE: 06/18/24   Pt arrives to therapy session in transport chair.   TA/TE:  Gait x 160 ft with RW   Attempted no AD but pt had 2 LOB in 10 ft requiring mod assist for recovery of balance.  Continued walking session for another 90 feet with rolling walker return to seated position  Seated long arc quad 2 x 10 with 3 pound ankle weights each lower extremity  Gait with rolling walker x 80 feet then patient elects to use bathroom and then another 80 feet to return  STS 6 times without upper extremity support but did have walker anterior to patient in case of loss of balance.  Gait with rolling walker and 3 pound ankle weights x 100 feet  Seated march with 3 pound ankle weights 2 x 10 each lower extremity  Gait with 3 pound ankle weights and rolling walker x 100 feet  Sit to stand x 6 repetitions with last repetition patient  transferring via stand pivot transfer to transport chair to in session  Transfer training from support bar to the walker, x10 total attempts with UE support on chair and verbal cues for proper form and making sure both LE's are touching the chair before sitting.     Unless otherwise stated, CGA was provided and gait belt donned in order to ensure pt safety   Therapist providing appropriate seated rest breaks throughout session.       PATIENT EDUCATION: Education details: exercise technique Person educated: Patient,  Education method: Explanation Education comprehension: verbalized understanding  HOME EXERCISE PROGRAM:  Access Code: [email or web address] URL: [email or web address] Date: 01/25/2024 Prepared by: Connell Kiss  Exercises - Sit to Stand with Counter Support  - 1 x daily - 7 x weekly - 2 sets - 10 reps - Narrow Stance with Counter Support  - 1 x daily - 7 x weekly - 2 sets - 30 seconds hold - Narrow Stance with Head Nods and Counter Support  - 1 x daily - 7 x weekly - 2 sets - 10 reps  Access Code: [email or web address] URL: [email or web address] Date: 05/15/2024 Prepared by: Marina  [name]  Exercises - Sit to Stand with Counter Support  - 1 x daily - 7 x weekly - 2 sets - 10 reps - Narrow Stance with Counter Support  - 1 x daily - 7 x weekly - 2 sets - 30 seconds hold - Narrow Stance with Head Nods and Counter Support  - 1 x daily - 7 x weekly - 2 sets - 10 reps - Standing March with Counter Support  - 1 x daily - 7 x weekly - 2 sets - 10 reps - 5 hold - Leg Extension  - 1 x daily - 7 x weekly - 2 sets - 10 reps - 5 hold - Seated Single Leg Hip Abduction with Resistance  - 1 x daily - 7 x weekly - 2 sets - 10 reps - 5 hold  GOALS: Goals reviewed with patient? Yes  SHORT TERM GOALS: Target date: 07/25/2024  Pt will be independent with HEP in order to improve strength and balance in order to decrease fall risk and improve function at home and work.  Baseline:  EVAL: no formal HEP in place 4/8: Completing 5 days per week 06/13/2024: pt reports consistent compliance with HEP Goal status: MET  LONG TERM GOALS: Target date: 09/05/2024    1.  Patient (> 71 years old) will complete five times sit to stand test in < 15 seconds  indicating an increased LE strength and improved balance. Baseline: EVAL: 22.32 sec without UE support 02/27/24: 22.81 sec no UE support;  03/21/2024= 16.17 sec without UE Support 06/06/24: 19.39 sec without UE support Goal status: PROGRESSING  2.  Pt will improve ABC by at least 13% in order to demonstrate clinically significant improvement in balance confidence Baseline: EVAL: 37.5% 05/09/24: 70%, question validity compared to prior assessment.  06/06/24: 53.125% Goal status: IN PROGRESS   3.  Patient will increase Berg Balance score by > 6 points to demonstrate decreased fall risk during functional activities. Baseline: 01/25/24: 25/56 02/27/24: 31 05/09/24: 33/56  06/11/24: 29/56 Goal status: IN PROGRESS   4.  Patient will reduce timed up and go to <11 seconds to reduce fall risk and demonstrate improved transfer/gait ability. Baseline: EVAL: 27.94 with RW 4/8: 35.58 sec with RW; 03/21/24= 26.88 sec 06/06/24: 34.06 sec Goal status: PROGRESSING  5.  Patient will increase 10 meter walk test to >1.90m/s as to improve gait speed for better community ambulation and to reduce fall risk. Baseline: EVAL: 0.65 m/s 02/27/24: .43 m/s with RW; 03/21/24= 0.71 m/s using RW 6/19: 0.35m/s using RW 06/06/24: 0.64 m/s with RW Goal status: PROGRESSING   6.  Patient will increase six minute walk test distance to >1000 for progression to community ambulator and improve gait ability Baseline: EVAL: To be assessed  visit #2 4/8: 600 ft; 03/21/24: 600 feet in 5 min using RW- stopped due to fatigue. 06/06/24: 438' using the RW Goal status: PROGRESSING  7.  Patient will improve score on Stroke Impact Scale 16 by 8 points to signify a higher level of functioning  and less difficulty with daily activities.  Baseline: EVAL: 52  02/27/24:55;  03/21/2024: 64 06/06/24: 66 Goal status: PROGRESSING   ASSESSMENT:  CLINICAL IMPRESSION:    Patient presents with good motivation for completion of physical therapy activities.  Overall patient displays increased unsteadiness and weakness this date likely due to prolonged inactivity secondary to instruction for mostly bed rest over the past several weeks.  Patient has no other procedure to likely cause some good rest starting tomorrow patient was instructed once he is off of bedrest to continue with functional activities to improve his mobility and balance and even just seated exercise can be beneficial as well.  Patient and caregiver both verbalized understanding.Pt will continue to benefit from skilled physical therapy intervention to address impairments, improve QOL, and attain therapy goals.      OBJECTIVE IMPAIRMENTS: Abnormal gait, decreased activity tolerance, decreased balance, decreased endurance, decreased mobility, and decreased strength.   ACTIVITY LIMITATIONS: carrying, lifting, bending, standing, squatting, and stairs  PARTICIPATION LIMITATIONS: cleaning, laundry, driving, shopping, community activity, and yard work  PERSONAL FACTORS: Age and 3+ comorbidities: HTN, CHF, A-FIB, DM are also affecting patient's functional outcome.   REHAB POTENTIAL: Good  CLINICAL DECISION MAKING: Evolving/moderate complexity  EVALUATION COMPLEXITY: Moderate  PLAN:  PT FREQUENCY: 1-2x/week  PT DURATION: 12 weeks  PLANNED INTERVENTIONS: 97164- PT Re-evaluation, 97110-Therapeutic exercises, 97530- Therapeutic activity, [id number]- Neuromuscular re-education, 97535- Self Care, [id number]- Manual therapy, [id number]- Gait training, [phone]- Canalith repositioning, [phone]- Electrical stimulation (manual), Patient/Family education, Balance training, Stair training, Dry Needling, Joint mobilization, Vestibular training, DME instructions,  Cryotherapy, and Moist heat  PLAN FOR NEXT SESSION:   - balance interventions including: narrow BOS, reaching/turning/rotating, eyes closed, and alternating foot taps - gait training without AD, encourage AD use also means independence (very reluctant to AD use)  Note: Portions of this document were prepared using  [employer]agon [employer] engineer and although reviewed may contain unintentional dictation errors in syntax, grammar, or spelling.  Lonni KATHEE Gainer PT ,DPT Physical Therapist- [facility]   07/25/24, 1:24 PM

## 2024-07-30 ENCOUNTER — Ambulatory Visit: Admitting: Physical Therapy

## 2024-07-30 ENCOUNTER — Ambulatory Visit

## 2024-08-01 ENCOUNTER — Ambulatory Visit

## 2024-08-06 ENCOUNTER — Ambulatory Visit: Admitting: Physical Therapy

## 2024-08-07 ENCOUNTER — Telehealth: Payer: Self-pay | Admitting: Cardiovascular Disease

## 2024-08-07 DIAGNOSIS — I10 Essential (primary) hypertension: Secondary | ICD-10-CM

## 2024-08-07 MED ORDER — LOSARTAN POTASSIUM 100 MG PO TABS
100.0000 mg | ORAL_TABLET | Freq: Every day | ORAL | 2 refills | Status: AC
Start: 2024-08-07 — End: ?

## 2024-08-07 NOTE — Telephone Encounter (Signed)
*  STAT* If patient is at the pharmacy, call can be transferred to refill team.   1. Which medications need to be refilled? (please list name of each medication and dose if known) losartan  (COZAAR ) 100 MG tablet    2. Would you like to learn more about the convenience, safety, & potential cost savings by using the [facility]? No    3. Are you open to using the [facility] (Type [facility]. ). No   4. Which pharmacy/location (including street and city if local pharmacy) is medication to be sent to? [facility] [address] [place], [place], [place] [address]   5. Do they need a 30 day or 90 day supply? 90 day

## 2024-08-07 NOTE — Telephone Encounter (Signed)
 Pt's medication was sent to pt's pharmacy as requested. Confirmation received.

## 2024-08-08 ENCOUNTER — Ambulatory Visit

## 2024-08-08 DIAGNOSIS — R2681 Unsteadiness on feet: Secondary | ICD-10-CM

## 2024-08-08 DIAGNOSIS — R262 Difficulty in walking, not elsewhere classified: Secondary | ICD-10-CM | POA: Diagnosis not present

## 2024-08-08 DIAGNOSIS — R278 Other lack of coordination: Secondary | ICD-10-CM

## 2024-08-08 DIAGNOSIS — M6281 Muscle weakness (generalized): Secondary | ICD-10-CM

## 2024-08-08 DIAGNOSIS — R269 Unspecified abnormalities of gait and mobility: Secondary | ICD-10-CM

## 2024-08-08 NOTE — Therapy (Signed)
 OUTPATIENT PHYSICAL THERAPY TREATMENT   Patient Name: Joseph [name] MRN: [id number] DOB:03/31/1925, 89 y.o., male Today's Date: 08/09/2024  PCP: Dr. Charlie Forte REFERRING PROVIDER: Gustavo Igo, PA  END OF SESSION:    PT End of Session - 08/08/24 1453     Visit Number 26    Number of Visits 39    Date for Recertification  09/05/24    Authorization Type [facility] approval [id number] for 15 more visits (06/18/2024 is 8 of 15)    Progress Note Due on Visit 30    PT Start Time 1450    PT Stop Time 1530    PT Time Calculation (min) 40 min    Equipment Utilized During Treatment Gait belt    Activity Tolerance Patient tolerated treatment well;No increased pain    Behavior During Therapy [facility] for tasks assessed/performed           Past Medical History:  Diagnosis Date   Acute respiratory failure with hypoxia (HCC) 03/21/2016   Atrial fibrillation (HCC) 03/22/2023   CAD (coronary artery disease)    a. s/p CABG;  b. 07/2012 Cath: 3VD including 80 dLCX (med Rx), 4/4 patent grafts.   Chronic diastolic CHF (congestive heart failure) (HCC)    a. 07/2012 Echo: EF 55-60%, no rwma, Gr 1 DD, mild MR;  04/2014 Echo: EF 55-60%, no rwma, mild MR, mildly dil LA.   Diabetes mellitus, type 2 (HCC)    HTN (hypertension)    Hyperlipidemia    Hypothyroidism    Lower extremity edema    a. 03/2014 amlodipine  d/c'd.   Spinal stenosis    Stroke (HCC)    1/24 and 5/24   Past Surgical History:  Procedure Laterality Date   APPENDECTOMY     CARDIAC CATHETERIZATION  07/2012   [facility]/no stents   CHOLECYSTECTOMY     CORNEAL TRANSPLANT     CORONARY ARTERY BYPASS GRAFT  1994   HEMORRHOID SURGERY     LEFT HEART CATH AND CORS/GRAFTS ANGIOGRAPHY N/A 12/17/2019   Procedure: LEFT HEART CATH AND CORS/GRAFTS ANGIOGRAPHY;  Surgeon: Mady Bruckner, MD;  Location: [facility] INVASIVE CV LAB;  Service: Cardiovascular;  Laterality: N/A;   PROSTATE BIOPSY     TOE SURGERY     ingrown toe nail   Patient Active Problem  List   Diagnosis Date Noted   Chronic anticoagulation 04/23/2023   Stroke (HCC) 04/06/2023   Type II diabetes mellitus with renal manifestations (HCC) 04/06/2023   Chronic kidney disease, stage 3a (HCC) 04/06/2023   COPD (chronic obstructive pulmonary disease) (HCC) 04/06/2023   Chronic diastolic CHF (congestive heart failure) (HCC) 04/06/2023   Permanent atrial fibrillation (HCC) 04/06/2023   Obesity (BMI 30-39.9) 04/06/2023   Acute cerebrovascular accident (CVA) (HCC) 12/09/2022   Chronic combined systolic and diastolic CHF (congestive heart failure) (HCC) 12/09/2022   Fall at home, initial encounter 12/09/2022   Hypokalemia 12/09/2022   Hemiparesis affecting right side as late effect of cerebrovascular accident (CVA) (HCC) 12/09/2022   Skin tear of left hand without complication 11/16/2022   Pain of left hip 11/16/2022   Left hip pain 11/10/2022   Intertrigo 11/10/2022   COPD with acute exacerbation (HCC) 10/06/2022   Diabetes mellitus (HCC) 06/10/2022   Enlarged prostate 03/02/2022   Other specified counseling 03/02/2022   Renal calculi 03/02/2022   Gastroesophageal reflux disease 03/02/2022   Hypertensive disorder 03/02/2022   Mixed hyperlipidemia 03/02/2022   Hypothyroidism 03/02/2022   Acute non-recurrent frontal sinusitis 01/05/2022   Glands swollen 01/05/2022  Hypertension associated with diabetes (HCC) 01/05/2022   Acute rhinitis 01/05/2022   AV block, Mobitz 1    NSTEMI (non-ST elevated myocardial infarction) [facility])    ACS (acute coronary syndrome) (HCC) 12/16/2019   Diverticulitis of large intestine without perforation or abscess without bleeding    Pain of upper abdomen    SOB (shortness of breath)    S/P CABG (coronary artery bypass graft)    Back pain, chronic 07/28/2015   Benign fibroma of prostate 07/28/2015   Acid reflux 07/28/2015   Adult hypothyroidism 07/28/2015   Neuropathy 07/28/2015   Arthritis, degenerative 07/28/2015   Psoriasis 07/28/2015    Lumbar spondylosis 05/08/2015   Obesity 09/09/2014   Bilateral leg weakness 09/09/2014   Acute combined systolic and diastolic heart failure (HCC) 05/13/2014   Bilateral leg edema 04/15/2014   Bradycardia 08/13/2013   Edema 06/21/2012   Weakness 09/15/2011   Type 2 diabetes mellitus with diabetic peripheral angiopathy without gangrene, with long-term current use of insulin  (HCC) 09/15/2011   DYSPNEA 06/09/2009   Hyperlipidemia 06/07/2009   Essential hypertension 06/07/2009   Coronary artery disease 06/07/2009   ONSET DATE: Jan 2024  REFERRING DIAG:  R26.81 (ICD-10-CM) - Gait instability  R26.89 (ICD-10-CM) - Decreased mobility    THERAPY DIAG:  Difficulty in walking, not elsewhere classified  Abnormality of gait  Unsteadiness on feet  Muscle weakness (generalized)  Difficulty walking  Other lack of coordination  Rationale for Evaluation and Treatment: Rehabilitation  SUBJECTIVE:                                                                                                                                                                                          SUBJECTIVE STATEMENT:    Patient reports lengthy absence due to a another eye procedure which patient reports as failed and will have to start whole process over at the end of October Pt accompanied by: caregiver Devere  PERTINENT HISTORY: Patient reports having 2 CVA last year (1 CVA affecting right and 1 affecting left side). Prior to CVA he reports he was walking normally without an AD. States has been walking with walker ever since.   PAIN:  Are you having pain? No PRECAUTIONS: Fall WEIGHT BEARING RESTRICTIONS: No FALLS: Has patient fallen in last 6 months? Yes. Number of falls 1  LIVING ENVIRONMENT: Lives with: lives alone and has private  caregivers - everyday  and [facility] caregivers -3x/week - approx 8 hours/day Lives in: House/apartment Stairs: has ramp supplied by [employer] Has following equipment at home:  Single point cane, Environmental consultant - 2 wheeled, Wheelchair (manual), Shower bench, bed side commode, Grab bars, Ramped entry, and  Transport w/c  PLOF: Independent  PATIENT GOALS: I want to walk without walker independently  OBJECTIVE:  Note: Objective measures were completed at Evaluation unless otherwise noted.  DIAGNOSTIC FINDINGS: CLINICAL DATA:  Stroke, follow up   EXAM: CT HEAD WITHOUT CONTRAST   TECHNIQUE: Contiguous axial images were obtained from the base of the skull through the vertex without intravenous contrast.   RADIATION DOSE REDUCTION: This exam was performed according to the departmental dose-optimization program which includes automated exposure control, adjustment of the mA and/or kV according to patient size and/or use of iterative reconstruction technique.   COMPARISON:  MR Brain 04/06/23, CT head 04/06/23   FINDINGS: Brain: No hemorrhage. No hydrocephalus. There are prominent subarachnoid spaces bilaterally, unchanged. There is sequela of moderate chronic microvascular ischemic change with a chronic infarct in the left corona radiata and an acute infarct in the right basal ganglia, better characterized on recent brain MRI.   Vascular: No hyperdense vessel or unexpected calcification.   Skull: Normal. Negative for fracture or focal lesion.   Sinuses/Orbits: No mastoid or middle ear effusion. Paranasal sinuses are clear. Bilateral lens replacement. Orbits are otherwise unremarkable.   Other: None.   IMPRESSION: 1. No acute intracranial hemorrhage. 2. Acute infarct in the right basal ganglia, better characterized on recent brain MRI    Electronically Signed   By: Lyndall Gore M.D.   On: 04/07/2023 16:08  COGNITION: Overall cognitive status: Within functional limits for tasks assessed EDEMA:  Some edema noted with lower left LE  LOWER EXTREMITY MMT:    MMT Right Eval Left Eval  Hip flexion 4 4  Hip extension    Hip abduction    Hip adduction     Hip internal rotation 4 4  Hip external rotation 4 4  Knee flexion 4 4  Knee extension 4 4  Ankle dorsiflexion 4 4  Ankle plantarflexion    Ankle inversion    Ankle eversion    (Blank rows = not tested)  TRANSFERS: Assistive device utilized: Environmental consultant - 2 wheeled  Sit to stand: CGA Stand to sit: CGA Chair to chair: CGA Floor: not tested  GAIT: Gait pattern: decreased step length- Right and decreased step length- Left Distance walked: > 100 feet  Assistive device utilized: Walker - 2 wheeled Level of assistance: CGA Comments: anterior trunk lean  FUNCTIONAL TESTS:  5 times sit to stand: 22.32 sec without UE support Timed up and go (TUG): 27.94 sec  6 minute walk test: To be assessed visit # 2 10 meter walk test: 16.10 and 14.92 sec with RW=15.51 sec avg or 0.65 m/s using RW  Berg Balance Scale: To be assessed visit #2  PATIENT SURVEYS:  Stroke Impact Scale 52 ABC scale 37.5 %                                                                                                                             TREATMENT DATE: 08/08/24   Pt  arrives to therapy session in transport chair.    NMR:  Activity Description: standing tap activity involving 3 pods on mirror wall and 3 on floor, Close CGA- including some side stepping and UE reaching Activity Setting:  The Blaze Pod Random setting was chosen to enhance cognitive processing and agility, providing an unpredictable environment to simulate real-world scenarios, and fostering quick reactions and adaptability.   Number of Pods:  6 Cycles/Sets:  10 Duration (Time or Hit Count):  45 sec Hits: Varied from 5 to 11  Gait x 160 ft with AD, close CGA with w/c follow- Improved ability to stand with more erect posture and no LOB STS 2x10 times without upper extremity support with VC    Unless otherwise stated, CGA was provided and gait belt donned in order to ensure pt safety   Therapist providing appropriate seated rest breaks  throughout session.       PATIENT EDUCATION: Education details: exercise technique Person educated: Patient,  Education method: Explanation Education comprehension: verbalized understanding  HOME EXERCISE PROGRAM:  Access Code: [email or web address] URL: [email or web address] Date: 01/25/2024 Prepared by: Connell Kiss  Exercises - Sit to Stand with Counter Support  - 1 x daily - 7 x weekly - 2 sets - 10 reps - Narrow Stance with Counter Support  - 1 x daily - 7 x weekly - 2 sets - 30 seconds hold - Narrow Stance with Head Nods and Counter Support  - 1 x daily - 7 x weekly - 2 sets - 10 reps  Access Code: [email or web address] URL: [email or web address] Date: 05/15/2024 Prepared by: Marina  [name]  Exercises - Sit to Stand with Counter Support  - 1 x daily - 7 x weekly - 2 sets - 10 reps - Narrow Stance with Counter Support  - 1 x daily - 7 x weekly - 2 sets - 30 seconds hold - Narrow Stance with Head Nods and Counter Support  - 1 x daily - 7 x weekly - 2 sets - 10 reps - Standing March with Counter Support  - 1 x daily - 7 x weekly - 2 sets - 10 reps - 5 hold - Leg Extension  - 1 x daily - 7 x weekly - 2 sets - 10 reps - 5 hold - Seated Single Leg Hip Abduction with Resistance  - 1 x daily - 7 x weekly - 2 sets - 10 reps - 5 hold  GOALS: Goals reviewed with patient? Yes  SHORT TERM GOALS: Target date: 07/25/2024  Pt will be independent with HEP in order to improve strength and balance in order to decrease fall risk and improve function at home and work.  Baseline: EVAL: no formal HEP in place 4/8: Completing 5 days per week 06/13/2024: pt reports consistent compliance with HEP Goal status: MET  LONG TERM GOALS: Target date: 09/05/2024    1.  Patient (> 39 years old) will complete five times sit to stand test in < 15 seconds indicating an increased LE strength and improved balance. Baseline: EVAL: 22.32 sec without UE support 02/27/24: 22.81 sec no UE support;   03/21/2024= 16.17 sec without UE Support 06/06/24: 19.39 sec without UE support Goal status: PROGRESSING  2.  Pt will improve ABC by at least 13% in order to demonstrate clinically significant improvement in balance confidence Baseline: EVAL: 37.5% 05/09/24: 70%, question validity compared to prior assessment.  06/06/24: 53.125% Goal status: IN PROGRESS   3.  Patient will increase Berg Balance score by > 6 points to  demonstrate decreased fall risk during functional activities. Baseline: 01/25/24: 25/56 02/27/24: 31 05/09/24: 33/56  06/11/24: 29/56 Goal status: IN PROGRESS   4.  Patient will reduce timed up and go to <11 seconds to reduce fall risk and demonstrate improved transfer/gait ability. Baseline: EVAL: 27.94 with RW 4/8: 35.58 sec with RW; 03/21/24= 26.88 sec 06/06/24: 34.06 sec Goal status: PROGRESSING  5.  Patient will increase 10 meter walk test to >1.64m/s as to improve gait speed for better community ambulation and to reduce fall risk. Baseline: EVAL: 0.65 m/s 02/27/24: .43 m/s with RW; 03/21/24= 0.71 m/s using RW 6/19: 0.20m/s using RW 06/06/24: 0.64 m/s with RW Goal status: PROGRESSING   6.  Patient will increase six minute walk test distance to >1000 for progression to community ambulator and improve gait ability Baseline: EVAL: To be assessed  visit #2 4/8: 600 ft; 03/21/24: 600 feet in 5 min using RW- stopped due to fatigue. 06/06/24: 438' using the RW Goal status: PROGRESSING  7.  Patient will improve score on Stroke Impact Scale 16 by 8 points to signify a higher level of functioning and less difficulty with daily activities.  Baseline: EVAL: 52  02/27/24:55;  03/21/2024: 64 06/06/24: 66 Goal status: PROGRESSING   ASSESSMENT:  CLINICAL IMPRESSION:    Patient returns after missed visit due to eye issues. He responded well overall today with some return of demonstration with balance activities. He was able to reach and step tap with increased confidence in session with CGA. He  performed well despite only able to see out of 1 eye and later able to walk without an AD with close CGA today.  Pt will continue to benefit from skilled physical therapy intervention to address impairments, improve QOL, and attain therapy goals.      OBJECTIVE IMPAIRMENTS: Abnormal gait, decreased activity tolerance, decreased balance, decreased endurance, decreased mobility, and decreased strength.   ACTIVITY LIMITATIONS: carrying, lifting, bending, standing, squatting, and stairs  PARTICIPATION LIMITATIONS: cleaning, laundry, driving, shopping, community activity, and yard work  PERSONAL FACTORS: Age and 3+ comorbidities: HTN, CHF, A-FIB, DM are also affecting patient's functional outcome.   REHAB POTENTIAL: Good  CLINICAL DECISION MAKING: Evolving/moderate complexity  EVALUATION COMPLEXITY: Moderate  PLAN:  PT FREQUENCY: 1-2x/week  PT DURATION: 12 weeks  PLANNED INTERVENTIONS: [id number]- PT Re-evaluation, 97110-Therapeutic exercises, 97530- Therapeutic activity, 97112- Neuromuscular re-education, 97535- Self Care, [id number]- Manual therapy, [phone]- Gait training, [phone]- Canalith repositioning, [phone]- Electrical stimulation (manual), Patient/Family education, Balance training, Stair training, Dry Needling, Joint mobilization, Vestibular training, DME instructions, Cryotherapy, and Moist heat  PLAN FOR NEXT SESSION:   - balance interventions including: narrow BOS, reaching/turning/rotating, eyes closed, and alternating foot taps - gait training without AD, encourage AD use also means independence (very reluctant to AD use)  Reyes LOISE London PT Physical Therapist- [facility] [place]   08/09/24, 10:46 AM

## 2024-08-13 ENCOUNTER — Ambulatory Visit: Admitting: Physical Therapy

## 2024-08-15 ENCOUNTER — Ambulatory Visit

## 2024-08-20 ENCOUNTER — Ambulatory Visit: Admitting: Physical Therapy

## 2024-08-22 ENCOUNTER — Ambulatory Visit

## 2024-08-27 ENCOUNTER — Ambulatory Visit: Admitting: Physical Therapy

## 2024-08-27 ENCOUNTER — Telehealth: Payer: Self-pay | Admitting: Cardiovascular Disease

## 2024-08-27 DIAGNOSIS — I5042 Chronic combined systolic (congestive) and diastolic (congestive) heart failure: Secondary | ICD-10-CM

## 2024-08-27 MED ORDER — TORSEMIDE 20 MG PO TABS: 20.0000 mg | ORAL_TABLET | Freq: Every day | ORAL | 2 refills | Status: DC

## 2024-08-27 NOTE — Telephone Encounter (Signed)
*  STAT* If patient is at the pharmacy, call can be transferred to refill team.   1. Which medications need to be refilled? (please list name of each medication and dose if known)   torsemide  (DEMADEX ) 20 MG tablet   2. Would you like to learn more about the convenience, safety, & potential cost savings by using the [facility]?   3. Are you open to using the [facility] (Type [facility]. ).  4. Which pharmacy/location (including street and city if local pharmacy) is medication to be sent to?  [facility] - [place], [place] - [address]   5. Do they need a 30 day or 90 day supply?  90 day  Caller Is[place]) stated patient only has 2 tablets left.

## 2024-08-27 NOTE — Telephone Encounter (Signed)
 RX sent in

## 2024-08-29 ENCOUNTER — Ambulatory Visit: Attending: Physician Assistant

## 2024-08-29 ENCOUNTER — Ambulatory Visit

## 2024-08-29 DIAGNOSIS — R262 Difficulty in walking, not elsewhere classified: Secondary | ICD-10-CM | POA: Diagnosis present

## 2024-08-29 DIAGNOSIS — R269 Unspecified abnormalities of gait and mobility: Secondary | ICD-10-CM | POA: Insufficient documentation

## 2024-08-29 DIAGNOSIS — M6281 Muscle weakness (generalized): Secondary | ICD-10-CM | POA: Insufficient documentation

## 2024-08-29 DIAGNOSIS — R2681 Unsteadiness on feet: Secondary | ICD-10-CM | POA: Diagnosis present

## 2024-08-29 DIAGNOSIS — R2689 Other abnormalities of gait and mobility: Secondary | ICD-10-CM | POA: Insufficient documentation

## 2024-08-29 DIAGNOSIS — I69398 Other sequelae of cerebral infarction: Secondary | ICD-10-CM | POA: Insufficient documentation

## 2024-08-29 DIAGNOSIS — R278 Other lack of coordination: Secondary | ICD-10-CM | POA: Insufficient documentation

## 2024-08-29 NOTE — Therapy (Signed)
 OUTPATIENT PHYSICAL THERAPY TREATMENT   Patient Name: Joseph [name] MRN: [id number] DOB:[birth year], 89 y.o., male Today's Date: 08/29/2024  PCP: Dr. Charlie Forte REFERRING PROVIDER: Gustavo Igo, PA  END OF SESSION:     PT End of Session - 08/29/24 1453     Visit Number 27    Number of Visits 39    Date for Recertification  09/05/24    Authorization Type [facility] approval [id number] for 15 more visits (06/18/2024 is 8 of 15)    Progress Note Due on Visit 30    PT Start Time 1450    PT Stop Time 1530    PT Time Calculation (min) 40 min    Equipment Utilized During Treatment Gait belt    Activity Tolerance Patient tolerated treatment well;No increased pain    Behavior During Therapy [facility] for tasks assessed/performed          Past Medical History:  Diagnosis Date   Acute respiratory failure with hypoxia (HCC) 03/21/2016   Atrial fibrillation (HCC) 03/22/2023   CAD (coronary artery disease)    a. s/p CABG;  b. 07/2012 Cath: 3VD including 80 dLCX (med Rx), 4/4 patent grafts.   Chronic diastolic CHF (congestive heart failure) (HCC)    a. 07/2012 Echo: EF 55-60%, no rwma, Gr 1 DD, mild MR;  04/2014 Echo: EF 55-60%, no rwma, mild MR, mildly dil LA.   Diabetes mellitus, type 2 (HCC)    HTN (hypertension)    Hyperlipidemia    Hypothyroidism    Lower extremity edema    a. 03/2014 amlodipine  d/c'd.   Spinal stenosis    Stroke (HCC)    1/24 and 5/24   Past Surgical History:  Procedure Laterality Date   APPENDECTOMY     CARDIAC CATHETERIZATION  07/2012   [facility]/no stents   CHOLECYSTECTOMY     CORNEAL TRANSPLANT     CORONARY ARTERY BYPASS GRAFT  1994   HEMORRHOID SURGERY     LEFT HEART CATH AND CORS/GRAFTS ANGIOGRAPHY N/A 12/17/2019   Procedure: LEFT HEART CATH AND CORS/GRAFTS ANGIOGRAPHY;  Surgeon: Mady Bruckner, MD;  Location: [facility] INVASIVE CV LAB;  Service: Cardiovascular;  Laterality: N/A;   PROSTATE BIOPSY     TOE SURGERY     ingrown toe nail   Patient Active Problem  List   Diagnosis Date Noted   Chronic anticoagulation 04/23/2023   Stroke (HCC) 04/06/2023   Type II diabetes mellitus with renal manifestations (HCC) 04/06/2023   Chronic kidney disease, stage 3a (HCC) 04/06/2023   COPD (chronic obstructive pulmonary disease) (HCC) 04/06/2023   Chronic diastolic CHF (congestive heart failure) (HCC) 04/06/2023   Permanent atrial fibrillation (HCC) 04/06/2023   Obesity (BMI 30-39.9) 04/06/2023   Acute cerebrovascular accident (CVA) (HCC) 12/09/2022   Chronic combined systolic and diastolic CHF (congestive heart failure) (HCC) 12/09/2022   Fall at home, initial encounter 12/09/2022   Hypokalemia 12/09/2022   Hemiparesis affecting right side as late effect of cerebrovascular accident (CVA) (HCC) 12/09/2022   Skin tear of left hand without complication 11/16/2022   Pain of left hip 11/16/2022   Left hip pain 11/10/2022   Intertrigo 11/10/2022   COPD with acute exacerbation (HCC) 10/06/2022   Diabetes mellitus (HCC) 06/10/2022   Enlarged prostate 03/02/2022   Other specified counseling 03/02/2022   Renal calculi 03/02/2022   Gastroesophageal reflux disease 03/02/2022   Hypertensive disorder 03/02/2022   Mixed hyperlipidemia 03/02/2022   Hypothyroidism 03/02/2022   Acute non-recurrent frontal sinusitis 01/05/2022   Glands swollen 01/05/2022  Hypertension associated with diabetes (HCC) 01/05/2022   Acute rhinitis 01/05/2022   AV block, Mobitz 1    NSTEMI (non-ST elevated myocardial infarction) [facility])    ACS (acute coronary syndrome) (HCC) 12/16/2019   Diverticulitis of large intestine without perforation or abscess without bleeding    Pain of upper abdomen    SOB (shortness of breath)    S/P CABG (coronary artery bypass graft)    Back pain, chronic 07/28/2015   Benign fibroma of prostate 07/28/2015   Acid reflux 07/28/2015   Adult hypothyroidism 07/28/2015   Neuropathy 07/28/2015   Arthritis, degenerative 07/28/2015   Psoriasis 07/28/2015    Lumbar spondylosis 05/08/2015   Obesity 09/09/2014   Bilateral leg weakness 09/09/2014   Acute combined systolic and diastolic heart failure (HCC) 05/13/2014   Bilateral leg edema 04/15/2014   Bradycardia 08/13/2013   Edema 06/21/2012   Weakness 09/15/2011   Type 2 diabetes mellitus with diabetic peripheral angiopathy without gangrene, with long-term current use of insulin  (HCC) 09/15/2011   DYSPNEA 06/09/2009   Hyperlipidemia 06/07/2009   Essential hypertension 06/07/2009   Coronary artery disease 06/07/2009   ONSET DATE: Jan 2024  REFERRING DIAG:  R26.81 (ICD-10-CM) - Gait instability  R26.89 (ICD-10-CM) - Decreased mobility    THERAPY DIAG:  Difficulty in walking, not elsewhere classified  Abnormality of gait  Unsteadiness on feet  Muscle weakness (generalized)  Difficulty walking  Other lack of coordination  Imbalance due to old stroke  Rationale for Evaluation and Treatment: Rehabilitation  SUBJECTIVE:                                                                                                                                                                                          SUBJECTIVE STATEMENT:    Pt reports his eyesight is better, but he's been having to go back and forth for re-checks and a lens was placed.  Pt then is still planning on having an eye operation later this month.  Pt voiced that he wanted to focus on walking which is his primary concern, not balance.    Pt accompanied by: caregiver Devere  PERTINENT HISTORY: Patient reports having 2 CVA last year (1 CVA affecting right and 1 affecting left side). Prior to CVA he reports he was walking normally without an AD. States has been walking with walker ever since.   PAIN:  Are you having pain? No PRECAUTIONS: Fall WEIGHT BEARING RESTRICTIONS: No FALLS: Has patient fallen in last 6 months? Yes. Number of falls 1  LIVING ENVIRONMENT: Lives with: lives alone and has private  caregivers -  everyday  and [facility] caregivers -3x/week - approx 8  hours/day Lives in: House/apartment Stairs: has ramp supplied by [employer] Has following equipment at home: Single point cane, Walker - 2 wheeled, Wheelchair (manual), Shower bench, bed side commode, Grab bars, Ramped entry, and Transport w/c  PLOF: Independent  PATIENT GOALS: I want to walk without walker independently  OBJECTIVE:  Note: Objective measures were completed at Evaluation unless otherwise noted.  DIAGNOSTIC FINDINGS: CLINICAL DATA:  Stroke, follow up   EXAM: CT HEAD WITHOUT CONTRAST   TECHNIQUE: Contiguous axial images were obtained from the base of the skull through the vertex without intravenous contrast.   RADIATION DOSE REDUCTION: This exam was performed according to the departmental dose-optimization program which includes automated exposure control, adjustment of the mA and/or kV according to patient size and/or use of iterative reconstruction technique.   COMPARISON:  MR Brain 04/06/23, CT head 04/06/23   FINDINGS: Brain: No hemorrhage. No hydrocephalus. There are prominent subarachnoid spaces bilaterally, unchanged. There is sequela of moderate chronic microvascular ischemic change with a chronic infarct in the left corona radiata and an acute infarct in the right basal ganglia, better characterized on recent brain MRI.   Vascular: No hyperdense vessel or unexpected calcification.   Skull: Normal. Negative for fracture or focal lesion.   Sinuses/Orbits: No mastoid or middle ear effusion. Paranasal sinuses are clear. Bilateral lens replacement. Orbits are otherwise unremarkable.   Other: None.   IMPRESSION: 1. No acute intracranial hemorrhage. 2. Acute infarct in the right basal ganglia, better characterized on recent brain MRI    Electronically Signed   By: Lyndall Gore M.D.   On: 04/07/2023 16:08  COGNITION: Overall cognitive status: Within functional limits for tasks assessed EDEMA:  Some edema  noted with lower left LE  LOWER EXTREMITY MMT:    MMT Right Eval Left Eval  Hip flexion 4 4  Hip extension    Hip abduction    Hip adduction    Hip internal rotation 4 4  Hip external rotation 4 4  Knee flexion 4 4  Knee extension 4 4  Ankle dorsiflexion 4 4  Ankle plantarflexion    Ankle inversion    Ankle eversion    (Blank rows = not tested)  TRANSFERS: Assistive device utilized: Environmental consultant - 2 wheeled  Sit to stand: CGA Stand to sit: CGA Chair to chair: CGA Floor: not tested  GAIT: Gait pattern: decreased step length- Right and decreased step length- Left Distance walked: > 100 feet  Assistive device utilized: Walker - 2 wheeled Level of assistance: CGA Comments: anterior trunk lean  FUNCTIONAL TESTS:  5 times sit to stand: 22.32 sec without UE support Timed up and go (TUG): 27.94 sec  6 minute walk test: To be assessed visit # 2 10 meter walk test: 16.10 and 14.92 sec with RW=15.51 sec avg or 0.65 m/s using RW  Berg Balance Scale: To be assessed visit #2  PATIENT SURVEYS:  Stroke Impact Scale 52 ABC scale 37.5 %  TREATMENT DATE: 08/29/24    Pt arrives to therapy session in transport chair.    Gait Training:  Circuit training performed with seated rest breaks while vitals were monitored in between bouts:   Overground ambulation, 160' with FWW, close CGA with more erect posture and no LOB  Overground ambulation, 160' with FWW, close CGA with more erect posture and no LOB  Overground ambulation, 160' with UP rollator, close CGA with more erect posture and no LOB  Overground ambulation, 160' with UP rollator, close CGA with more erect posture and no LOB  Overground ambulation, 36', SPC, close CGA with more erect posture and no LOB  Overground ambulation, 36', no AD, close CGA with more erect posture and no LOB  Unless otherwise  stated, CGA was provided and gait belt donned in order to ensure pt safety  Therapist providing appropriate seated rest breaks throughout session.  Vitals assessed and HR maintained within 88-102 bpm, O2 saturation remained >92%    PATIENT EDUCATION: Education details: exercise technique Person educated: Patient,  Education method: Explanation Education comprehension: verbalized understanding  HOME EXERCISE PROGRAM:  Access Code: [email or web address] URL: [email or web address] Date: 01/25/2024 Prepared by: Connell Kiss  Exercises - Sit to Stand with Counter Support  - 1 x daily - 7 x weekly - 2 sets - 10 reps - Narrow Stance with Counter Support  - 1 x daily - 7 x weekly - 2 sets - 30 seconds hold - Narrow Stance with Head Nods and Counter Support  - 1 x daily - 7 x weekly - 2 sets - 10 reps  Access Code: [email or web address] URL: [email or web address] Date: 05/15/2024 Prepared by: Marina  [name]  Exercises - Sit to Stand with Counter Support  - 1 x daily - 7 x weekly - 2 sets - 10 reps - Narrow Stance with Counter Support  - 1 x daily - 7 x weekly - 2 sets - 30 seconds hold - Narrow Stance with Head Nods and Counter Support  - 1 x daily - 7 x weekly - 2 sets - 10 reps - Standing March with Counter Support  - 1 x daily - 7 x weekly - 2 sets - 10 reps - 5 hold - Leg Extension  - 1 x daily - 7 x weekly - 2 sets - 10 reps - 5 hold - Seated Single Leg Hip Abduction with Resistance  - 1 x daily - 7 x weekly - 2 sets - 10 reps - 5 hold  GOALS: Goals reviewed with patient? Yes  SHORT TERM GOALS: Target date: 07/25/2024  Pt will be independent with HEP in order to improve strength and balance in order to decrease fall risk and improve function at home and work.  Baseline: EVAL: no formal HEP in place 4/8: Completing 5 days per week 06/13/2024: pt reports consistent compliance with HEP Goal status: MET  LONG TERM GOALS: Target date: 09/05/2024    1.  Patient (> 53 years old)  will complete five times sit to stand test in < 15 seconds indicating an increased LE strength and improved balance. Baseline: EVAL: 22.32 sec without UE support 02/27/24: 22.81 sec no UE support;  03/21/2024= 16.17 sec without UE Support 06/06/24: 19.39 sec without UE support Goal status: PROGRESSING  2.  Pt will improve ABC by at least 13% in order to demonstrate clinically significant improvement in balance confidence Baseline: EVAL: 37.5% 05/09/24: 70%, question validity compared to prior assessment.  06/06/24: 53.125% Goal status: IN PROGRESS   3.  Patient will increase Berg Balance score by > 6 points to demonstrate decreased fall risk during functional activities. Baseline: 01/25/24: 25/56 02/27/24: 31 05/09/24: 33/56  06/11/24: 29/56 Goal status: IN PROGRESS   4.  Patient will reduce timed up and go to <11 seconds to reduce fall risk and demonstrate improved transfer/gait ability. Baseline: EVAL: 27.94 with RW 4/8: 35.58 sec with RW; 03/21/24= 26.88 sec 06/06/24: 34.06 sec Goal status: PROGRESSING  5.  Patient will increase 10 meter walk test to >1.59m/s as to improve gait speed for better community ambulation and to reduce fall risk. Baseline: EVAL: 0.65 m/s 02/27/24: .43 m/s with RW; 03/21/24= 0.71 m/s using RW 6/19: 0.84m/s using RW 06/06/24: 0.64 m/s with RW Goal status: PROGRESSING   6.  Patient will increase six minute walk test distance to >1000 for progression to community ambulator and improve gait ability Baseline: EVAL: To be assessed  visit #2 4/8: 600 ft; 03/21/24: 600 feet in 5 min using RW- stopped due to fatigue. 06/06/24: 438' using the RW Goal status: PROGRESSING  7.  Patient will improve score on Stroke Impact Scale 16 by 8 points to signify a higher level of functioning and less difficulty with daily activities.  Baseline: EVAL: 52  02/27/24:55;  03/21/2024: 64 06/06/24: 66 Goal status: PROGRESSING   ASSESSMENT:  CLINICAL IMPRESSION:    Pt wanted to direct treatment  session towards mobility today, as he feels as this is more important than the balance training that has been the primary focus on thus far.  Pt educated on the importance of balance in regards to walking and for fall preventions.  Pt does have instability with the use of the cane and has issues sequencing the correct movement pattern with it being in the R hand for the L LE weakness he is experiencing.  Pt also able to ambulate without the need of AD, but very quick with walking and would likely fall forward if not for therapist cues to slow down and providing minA at times to keep pt from ambulating too quickly.   Pt will continue to benefit from skilled therapy to address remaining deficits in order to improve overall QoL and return to PLOF.        OBJECTIVE IMPAIRMENTS: Abnormal gait, decreased activity tolerance, decreased balance, decreased endurance, decreased mobility, and decreased strength.   ACTIVITY LIMITATIONS: carrying, lifting, bending, standing, squatting, and stairs  PARTICIPATION LIMITATIONS: cleaning, laundry, driving, shopping, community activity, and yard work  PERSONAL FACTORS: Age and 3+ comorbidities: HTN, CHF, A-FIB, DM are also affecting patient's functional outcome.   REHAB POTENTIAL: Good  CLINICAL DECISION MAKING: Evolving/moderate complexity  EVALUATION COMPLEXITY: Moderate  PLAN:  PT FREQUENCY: 1-2x/week  PT DURATION: 12 weeks  PLANNED INTERVENTIONS: [id number]- PT Re-evaluation, 97110-Therapeutic exercises, 97530- Therapeutic activity, 97112- Neuromuscular re-education, 97535- Self Care, [id number]- Manual therapy, [phone]- Gait training, [phone]- Canalith repositioning, [phone]- Electrical stimulation (manual), Patient/Family education, Balance training, Stair training, Dry Needling, Joint mobilization, Vestibular training, DME instructions, Cryotherapy, and Moist heat  PLAN FOR NEXT SESSION:   - balance interventions including: narrow BOS, reaching/turning/rotating,  eyes closed, and alternating foot taps - gait training without AD, encourage AD use also means independence (very reluctant to AD use)   Fonda Simpers, PT, DPT Physical Therapist - [facility]  08/29/24, 4:27 PM

## 2024-09-02 ENCOUNTER — Ambulatory Visit

## 2024-09-03 ENCOUNTER — Ambulatory Visit

## 2024-09-03 ENCOUNTER — Ambulatory Visit: Admitting: Physical Therapy

## 2024-09-03 DIAGNOSIS — R2681 Unsteadiness on feet: Secondary | ICD-10-CM

## 2024-09-03 DIAGNOSIS — I69398 Other sequelae of cerebral infarction: Secondary | ICD-10-CM

## 2024-09-03 DIAGNOSIS — R269 Unspecified abnormalities of gait and mobility: Secondary | ICD-10-CM

## 2024-09-03 DIAGNOSIS — R262 Difficulty in walking, not elsewhere classified: Secondary | ICD-10-CM

## 2024-09-03 DIAGNOSIS — R278 Other lack of coordination: Secondary | ICD-10-CM

## 2024-09-03 DIAGNOSIS — M6281 Muscle weakness (generalized): Secondary | ICD-10-CM

## 2024-09-03 NOTE — Therapy (Addendum)
 OUTPATIENT PHYSICAL THERAPY TREATMENT/RE-CERT   Patient Name: Joseph [name] MRN: [id number] DOB:Aug 15, 1925, 89 y.o., male Today's Date: 09/03/2024  PCP: Dr. Charlie Forte REFERRING PROVIDER: Gustavo Igo, PA  END OF SESSION:     PT End of Session - 09/03/24 1107     Visit Number 28    Number of Visits 40   Date for Recertification  11/26/2024   Authorization Type [facility] approval [id number] for 15 more visits (09/03/24 is 13 of 15)    Progress Note Due on Visit 30    Equipment Utilized During Treatment Gait belt    Activity Tolerance Patient tolerated treatment well;No increased pain    Behavior During Therapy [facility] for tasks assessed/performed           Past Medical History:  Diagnosis Date   Acute respiratory failure with hypoxia (HCC) 03/21/2016   Atrial fibrillation (HCC) 03/22/2023   CAD (coronary artery disease)    a. s/p CABG;  b. 07/2012 Cath: 3VD including 80 dLCX (med Rx), 4/4 patent grafts.   Chronic diastolic CHF (congestive heart failure) (HCC)    a. 07/2012 Echo: EF 55-60%, no rwma, Gr 1 DD, mild MR;  04/2014 Echo: EF 55-60%, no rwma, mild MR, mildly dil LA.   Diabetes mellitus, type 2 (HCC)    HTN (hypertension)    Hyperlipidemia    Hypothyroidism    Lower extremity edema    a. 03/2014 amlodipine  d/c'd.   Spinal stenosis    Stroke (HCC)    1/24 and 5/24   Past Surgical History:  Procedure Laterality Date   APPENDECTOMY     CARDIAC CATHETERIZATION  07/2012   [facility]/no stents   CHOLECYSTECTOMY     CORNEAL TRANSPLANT     CORONARY ARTERY BYPASS GRAFT  1994   HEMORRHOID SURGERY     LEFT HEART CATH AND CORS/GRAFTS ANGIOGRAPHY N/A 12/17/2019   Procedure: LEFT HEART CATH AND CORS/GRAFTS ANGIOGRAPHY;  Surgeon: Mady Bruckner, MD;  Location: [facility] INVASIVE CV LAB;  Service: Cardiovascular;  Laterality: N/A;   PROSTATE BIOPSY     TOE SURGERY     ingrown toe nail   Patient Active Problem List   Diagnosis Date Noted   Chronic anticoagulation 04/23/2023    Stroke (HCC) 04/06/2023   Type II diabetes mellitus with renal manifestations (HCC) 04/06/2023   Chronic kidney disease, stage 3a (HCC) 04/06/2023   COPD (chronic obstructive pulmonary disease) (HCC) 04/06/2023   Chronic diastolic CHF (congestive heart failure) (HCC) 04/06/2023   Permanent atrial fibrillation (HCC) 04/06/2023   Obesity (BMI 30-39.9) 04/06/2023   Acute cerebrovascular accident (CVA) (HCC) 12/09/2022   Chronic combined systolic and diastolic CHF (congestive heart failure) (HCC) 12/09/2022   Fall at home, initial encounter 12/09/2022   Hypokalemia 12/09/2022   Hemiparesis affecting right side as late effect of cerebrovascular accident (CVA) (HCC) 12/09/2022   Skin tear of left hand without complication 11/16/2022   Pain of left hip 11/16/2022   Left hip pain 11/10/2022   Intertrigo 11/10/2022   COPD with acute exacerbation (HCC) 10/06/2022   Diabetes mellitus (HCC) 06/10/2022   Enlarged prostate 03/02/2022   Other specified counseling 03/02/2022   Renal calculi 03/02/2022   Gastroesophageal reflux disease 03/02/2022   Hypertensive disorder 03/02/2022   Mixed hyperlipidemia 03/02/2022   Hypothyroidism 03/02/2022   Acute non-recurrent frontal sinusitis 01/05/2022   Glands swollen 01/05/2022   Hypertension associated with diabetes (HCC) 01/05/2022   Acute rhinitis 01/05/2022   AV block, Mobitz 1    NSTEMI (non-ST elevated myocardial  infarction) [facility])    ACS (acute coronary syndrome) (HCC) 12/16/2019   Diverticulitis of large intestine without perforation or abscess without bleeding    Pain of upper abdomen    SOB (shortness of breath)    S/P CABG (coronary artery bypass graft)    Back pain, chronic 07/28/2015   Benign fibroma of prostate 07/28/2015   Acid reflux 07/28/2015   Adult hypothyroidism 07/28/2015   Neuropathy 07/28/2015   Arthritis, degenerative 07/28/2015   Psoriasis 07/28/2015   Lumbar spondylosis 05/08/2015   Obesity 09/09/2014   Bilateral leg  weakness 09/09/2014   Acute combined systolic and diastolic heart failure (HCC) 05/13/2014   Bilateral leg edema 04/15/2014   Bradycardia 08/13/2013   Edema 06/21/2012   Weakness 09/15/2011   Type 2 diabetes mellitus with diabetic peripheral angiopathy without gangrene, with long-term current use of insulin  (HCC) 09/15/2011   DYSPNEA 06/09/2009   Hyperlipidemia 06/07/2009   Essential hypertension 06/07/2009   Coronary artery disease 06/07/2009   ONSET DATE: Jan 2024  REFERRING DIAG:  R26.81 (ICD-10-CM) - Gait instability  R26.89 (ICD-10-CM) - Decreased mobility    THERAPY DIAG:  Difficulty in walking, not elsewhere classified  Abnormality of gait  Unsteadiness on feet  Muscle weakness (generalized)  Difficulty walking  Other lack of coordination  Imbalance due to old stroke  Rationale for Evaluation and Treatment: Rehabilitation  SUBJECTIVE:                                                                                                                                                                                          SUBJECTIVE STATEMENT:    Pt is ready for re-certification/assessment.  Pt denies any other new complaints at this time.  Pt accompanied by: caregiver Devere  PERTINENT HISTORY: Patient reports having 2 CVA last year (1 CVA affecting right and 1 affecting left side). Prior to CVA he reports he was walking normally without an AD. States has been walking with walker ever since.   PAIN:  Are you having pain? No PRECAUTIONS: Fall WEIGHT BEARING RESTRICTIONS: No FALLS: Has patient fallen in last 6 months? Yes. Number of falls 1  LIVING ENVIRONMENT: Lives with: lives alone and has private  caregivers - everyday  and [facility] caregivers -3x/week - approx 8 hours/day Lives in: House/apartment Stairs: has ramp supplied by [employer] Has following equipment at home: Single point cane, Environmental Consultant - 2 wheeled, Wheelchair (manual), Shower bench, bed side commode, Grab bars,  Ramped entry, and Transport w/c  PLOF: Independent  PATIENT GOALS: I want to walk without walker independently  OBJECTIVE:  Note: Objective measures were completed at Evaluation unless otherwise noted.  DIAGNOSTIC  FINDINGS: CLINICAL DATA:  Stroke, follow up   EXAM: CT HEAD WITHOUT CONTRAST   TECHNIQUE: Contiguous axial images were obtained from the base of the skull through the vertex without intravenous contrast.   RADIATION DOSE REDUCTION: This exam was performed according to the departmental dose-optimization program which includes automated exposure control, adjustment of the mA and/or kV according to patient size and/or use of iterative reconstruction technique.   COMPARISON:  MR Brain 04/06/23, CT head 04/06/23   FINDINGS: Brain: No hemorrhage. No hydrocephalus. There are prominent subarachnoid spaces bilaterally, unchanged. There is sequela of moderate chronic microvascular ischemic change with a chronic infarct in the left corona radiata and an acute infarct in the right basal ganglia, better characterized on recent brain MRI.   Vascular: No hyperdense vessel or unexpected calcification.   Skull: Normal. Negative for fracture or focal lesion.   Sinuses/Orbits: No mastoid or middle ear effusion. Paranasal sinuses are clear. Bilateral lens replacement. Orbits are otherwise unremarkable.   Other: None.   IMPRESSION: 1. No acute intracranial hemorrhage. 2. Acute infarct in the right basal ganglia, better characterized on recent brain MRI    Electronically Signed   By: Lyndall Gore M.D.   On: 04/07/2023 16:08  COGNITION: Overall cognitive status: Within functional limits for tasks assessed EDEMA:  Some edema noted with lower left LE  LOWER EXTREMITY MMT:    MMT Right Eval Left Eval  Hip flexion 4 4  Hip extension    Hip abduction    Hip adduction    Hip internal rotation 4 4  Hip external rotation 4 4  Knee flexion 4 4  Knee extension 4 4  Ankle  dorsiflexion 4 4  Ankle plantarflexion    Ankle inversion    Ankle eversion    (Blank rows = not tested)  TRANSFERS: Assistive device utilized: Environmental Consultant - 2 wheeled  Sit to stand: CGA Stand to sit: CGA Chair to chair: CGA Floor: not tested  GAIT: Gait pattern: decreased step length- Right and decreased step length- Left Distance walked: > 100 feet  Assistive device utilized: Walker - 2 wheeled Level of assistance: CGA Comments: anterior trunk lean  FUNCTIONAL TESTS:  5 times sit to stand: 22.32 sec without UE support Timed up and go (TUG): 27.94 sec  6 minute walk test: To be assessed visit # 2 10 meter walk test: 16.10 and 14.92 sec with RW=15.51 sec avg or 0.65 m/s using RW  Berg Balance Scale: To be assessed visit #2  PATIENT SURVEYS:  Stroke Impact Scale 52 ABC scale 37.5 %                                                                                                                             TREATMENT DATE: 09/03/24   Physical Performance Testing:  Five times Sit to Stand Test (FTSS)  TIME: 19.18 sec without UE support  Cut off scores indicative of increased fall risk: >12 sec CVA, >16  sec PD, >13 sec vestibular (ANPTA Core Set of Outcome Measures for Adults with Neurologic Conditions, 2018)   TUG: PT instructed pt in TUG: 43.70 sec ( >13.5 sec indicates increased fall risk)  10 Meter Walk Test: Patient instructed to walk 10 meters (32.8 ft) as quickly and as safely as possible at their normal speed Results: 0.55 m/s (18.18 seconds)  Cut off scores:   Household Ambulator  < 0.4 m/s  Limited Community Ambulator  0.4 - 0.8 m/s  [employer]  > 0.8 m/s  Increased fall risk  < 1.49m/s  Crossing a Street  >1.62m/s  MCID 0.05 m/s (small), 0.13 m/s (moderate), 0.06 m/s (significant)  (ANPTA Core Set of Outcome Measures for Adults with Neurologic Conditions, 2018)    6 Min Walk Test:  Instructed patient to ambulate as quickly and as safely as  possible for 6 minutes using LRAD. Patient was allowed to take standing rest breaks without stopping the test, but if the patient required a sitting rest break the clock would be stopped and the test would be over.  Results: 430 feet using a RW with CGA. Results indicate that the patient has reduced endurance with ambulation compared to age matched norms.  Age Matched Norms (in meters): 20-69 yo M: 60 F: 75, 95-79 yo M: 10 F: 471, 20-89 yo M: 417 F: 392 MDC: 58.21 meters (190.98 feet) or 50 meters (ANPTA Core Set of Outcome Measures for Adults with Neurologic Conditions, 2018)      PATIENT EDUCATION: Education details: exercise technique Person educated: Patient,  Education method: Explanation Education comprehension: verbalized understanding  HOME EXERCISE PROGRAM:  Access Code: [email or web address] URL: [email or web address] Date: 01/25/2024 Prepared by: Connell Kiss  Exercises - Sit to Stand with Counter Support  - 1 x daily - 7 x weekly - 2 sets - 10 reps - Narrow Stance with Counter Support  - 1 x daily - 7 x weekly - 2 sets - 30 seconds hold - Narrow Stance with Head Nods and Counter Support  - 1 x daily - 7 x weekly - 2 sets - 10 reps  Access Code: [email or web address] URL: [email or web address] Date: 05/15/2024 Prepared by: Marina  [name]  Exercises - Sit to Stand with Counter Support  - 1 x daily - 7 x weekly - 2 sets - 10 reps - Narrow Stance with Counter Support  - 1 x daily - 7 x weekly - 2 sets - 30 seconds hold - Narrow Stance with Head Nods and Counter Support  - 1 x daily - 7 x weekly - 2 sets - 10 reps - Standing March with Counter Support  - 1 x daily - 7 x weekly - 2 sets - 10 reps - 5 hold - Leg Extension  - 1 x daily - 7 x weekly - 2 sets - 10 reps - 5 hold - Seated Single Leg Hip Abduction with Resistance  - 1 x daily - 7 x weekly - 2 sets - 10 reps - 5 hold  GOALS: Goals reviewed with patient? Yes  SHORT TERM GOALS: Target date: 07/25/2024  Pt  will be independent with HEP in order to improve strength and balance in order to decrease fall risk and improve function at home and work.  Baseline: EVAL: no formal HEP in place 4/8: Completing 5 days per week 06/13/2024: pt reports consistent compliance with HEP Goal status: MET  LONG TERM GOALS: Target date: 11/27/23   1.  Patient (> 42 years old) will complete five  times sit to stand test in < 15 seconds indicating an increased LE strength and improved balance. Baseline: EVAL: 22.32 sec without UE support 02/27/24: 22.81 sec no UE support;  03/21/2024= 16.17 sec without UE Support 06/06/24: 19.39 sec without UE support 09/03/24: 16.95 with UE support; 19.18 sec without UE support and one instance of instability Goal status: PROGRESSING  2.  Pt will improve ABC by at least 13% in order to demonstrate clinically significant improvement in balance confidence Baseline: EVAL: 37.5% 05/09/24: 70%, question validity compared to prior assessment.  06/06/24: 53.125% 09/03/24: TBD at subsequent visit Goal status: IN PROGRESS   3.  Patient will increase Berg Balance score by > 6 points to demonstrate decreased fall risk during functional activities. Baseline: 01/25/24: 25/56 02/27/24: 31 05/09/24: 33/56  06/11/24: 29/56 09/03/24: TBD at subsequent visit Goal status: IN PROGRESS   4.  Patient will reduce timed up and go to <11 seconds to reduce fall risk and demonstrate improved transfer/gait ability. Baseline: EVAL: 27.94 with RW 4/8: 35.58 sec with RW; 03/21/24= 26.88 sec 06/06/24: 34.06 sec 09/03/24: 43.70 sec Goal status: PROGRESSING  5.  Patient will increase 10 meter walk test to >1.71m/s as to improve gait speed for better community ambulation and to reduce fall risk. Baseline: EVAL: 0.65 m/s 02/27/24: .43 m/s with RW; 03/21/24= 0.71 m/s using RW 6/19: 0.53m/s using RW 06/06/24: 0.64 m/s with RW 09/03/24: 0.55 m/s with RW Goal status: PROGRESSING   6.  Patient will increase six minute walk test  distance to >1000 for progression to community ambulator and improve gait ability Baseline: EVAL: To be assessed  visit #2 4/8: 600 ft; 03/21/24: 600 feet in 5 min using RW- stopped due to fatigue. 06/06/24: 438' using the RW 09/03/24: 430' using the RW and 3 standing rest breaks Goal status: PROGRESSING  7.  Patient will improve score on Stroke Impact Scale 16 by 8 points to signify a higher level of functioning and less difficulty with daily activities.  Baseline: EVAL: 52  02/27/24:55;  03/21/2024: 64 06/06/24: 66 09/03/24: TBD at subsequent visit Goal status: PROGRESSING   ASSESSMENT:  CLINICAL IMPRESSION:    Pt assessed today for re-certification.  Pt has not made the expected progress at this time, however this is likely due to the fact that he has not been able to be consistent with therapy over the past several months.  Pt was most limited due to the eye surgery that condition that caused him to be in a supine position for a long period of time.  Pt has maintained close to similar values as noted above, but would benefit from increased amount of visits.  Patient's condition has the potential to improve in response to therapy. Maximum improvement is yet to be obtained. The anticipated improvement is attainable and reasonable in a generally predictable time.   Pt will continue to benefit from skilled therapy to address remaining deficits in order to improve overall QoL and return to PLOF.         OBJECTIVE IMPAIRMENTS: Abnormal gait, decreased activity tolerance, decreased balance, decreased endurance, decreased mobility, and decreased strength.   ACTIVITY LIMITATIONS: carrying, lifting, bending, standing, squatting, and stairs  PARTICIPATION LIMITATIONS: cleaning, laundry, driving, shopping, community activity, and yard work  PERSONAL FACTORS: Age and 3+ comorbidities: HTN, CHF, A-FIB, DM are also affecting patient's functional outcome.   REHAB POTENTIAL: Good  CLINICAL DECISION  MAKING: Evolving/moderate complexity  EVALUATION COMPLEXITY: Moderate  PLAN:  PT FREQUENCY: 1-2x/week  PT DURATION: 12  weeks  PLANNED INTERVENTIONS: [id number]- PT Re-evaluation, 97110-Therapeutic exercises, 97530- Therapeutic activity, 97112- Neuromuscular re-education, 97535- Self Care, [id number]- Manual therapy, [phone]- Gait training, [phone]- Canalith repositioning, [phone]- Electrical stimulation (manual), Patient/Family education, Balance training, Stair training, Dry Needling, Joint mobilization, Vestibular training, DME instructions, Cryotherapy, and Moist heat  PLAN FOR NEXT SESSION:   - balance interventions including: narrow BOS, reaching/turning/rotating, eyes closed, and alternating foot taps - gait training without AD, encourage AD use also means independence (very reluctant to AD use)   Fonda Simpers, PT, DPT Physical Therapist - [facility]  09/03/24, 11:07 AM

## 2024-09-05 ENCOUNTER — Ambulatory Visit

## 2024-09-09 ENCOUNTER — Ambulatory Visit: Admitting: Physical Therapy

## 2024-09-10 ENCOUNTER — Ambulatory Visit: Admitting: Physical Therapy

## 2024-09-12 ENCOUNTER — Ambulatory Visit

## 2024-09-16 ENCOUNTER — Ambulatory Visit

## 2024-09-17 ENCOUNTER — Ambulatory Visit: Admitting: Physical Therapy

## 2024-09-19 ENCOUNTER — Ambulatory Visit

## 2024-09-19 DIAGNOSIS — R262 Difficulty in walking, not elsewhere classified: Secondary | ICD-10-CM | POA: Diagnosis not present

## 2024-09-19 DIAGNOSIS — R269 Unspecified abnormalities of gait and mobility: Secondary | ICD-10-CM

## 2024-09-19 DIAGNOSIS — I69398 Other sequelae of cerebral infarction: Secondary | ICD-10-CM

## 2024-09-19 DIAGNOSIS — R2681 Unsteadiness on feet: Secondary | ICD-10-CM

## 2024-09-19 DIAGNOSIS — M6281 Muscle weakness (generalized): Secondary | ICD-10-CM

## 2024-09-19 DIAGNOSIS — R278 Other lack of coordination: Secondary | ICD-10-CM

## 2024-09-19 NOTE — Therapy (Addendum)
 OUTPATIENT PHYSICAL THERAPY TREATMENT   Patient Name: Joseph [name] MRN: [id number] DOB:September 10, 1925, 89 y.o., male Today's Date: 09/19/2024  PCP: Dr. Charlie Forte REFERRING PROVIDER: Gustavo Igo, PA  END OF SESSION:     PT End of Session - 09/19/24 1532     Visit Number 29    Number of Visits 40    Date for Recertification  11/26/24    Authorization Type [facility] approval [id number] for 15 more visits (09/19/24 is 14 of 15)    Progress Note Due on Visit 30    PT Start Time 1533    PT Stop Time 1615    PT Time Calculation (min) 42 min    Equipment Utilized During Treatment Gait belt    Activity Tolerance Patient tolerated treatment well;No increased pain    Behavior During Therapy [facility] for tasks assessed/performed          Past Medical History:  Diagnosis Date   Acute respiratory failure with hypoxia (HCC) 03/21/2016   Atrial fibrillation (HCC) 03/22/2023   CAD (coronary artery disease)    a. s/p CABG;  b. 07/2012 Cath: 3VD including 80 dLCX (med Rx), 4/4 patent grafts.   Chronic diastolic CHF (congestive heart failure) (HCC)    a. 07/2012 Echo: EF 55-60%, no rwma, Gr 1 DD, mild MR;  04/2014 Echo: EF 55-60%, no rwma, mild MR, mildly dil LA.   Diabetes mellitus, type 2 (HCC)    HTN (hypertension)    Hyperlipidemia    Hypothyroidism    Lower extremity edema    a. 03/2014 amlodipine  d/c'd.   Spinal stenosis    Stroke (HCC)    1/24 and 5/24   Past Surgical History:  Procedure Laterality Date   APPENDECTOMY     CARDIAC CATHETERIZATION  07/2012   [facility]/no stents   CHOLECYSTECTOMY     CORNEAL TRANSPLANT     CORONARY ARTERY BYPASS GRAFT  1994   HEMORRHOID SURGERY     LEFT HEART CATH AND CORS/GRAFTS ANGIOGRAPHY N/A 12/17/2019   Procedure: LEFT HEART CATH AND CORS/GRAFTS ANGIOGRAPHY;  Surgeon: Mady Bruckner, MD;  Location: [facility] INVASIVE CV LAB;  Service: Cardiovascular;  Laterality: N/A;   PROSTATE BIOPSY     TOE SURGERY     ingrown toe nail   Patient Active Problem  List   Diagnosis Date Noted   Chronic anticoagulation 04/23/2023   Stroke (HCC) 04/06/2023   Type II diabetes mellitus with renal manifestations (HCC) 04/06/2023   Chronic kidney disease, stage 3a (HCC) 04/06/2023   COPD (chronic obstructive pulmonary disease) (HCC) 04/06/2023   Chronic diastolic CHF (congestive heart failure) (HCC) 04/06/2023   Permanent atrial fibrillation (HCC) 04/06/2023   Obesity (BMI 30-39.9) 04/06/2023   Acute cerebrovascular accident (CVA) (HCC) 12/09/2022   Chronic combined systolic and diastolic CHF (congestive heart failure) (HCC) 12/09/2022   Fall at home, initial encounter 12/09/2022   Hypokalemia 12/09/2022   Hemiparesis affecting right side as late effect of cerebrovascular accident (CVA) (HCC) 12/09/2022   Skin tear of left hand without complication 11/16/2022   Pain of left hip 11/16/2022   Left hip pain 11/10/2022   Intertrigo 11/10/2022   COPD with acute exacerbation (HCC) 10/06/2022   Diabetes mellitus (HCC) 06/10/2022   Enlarged prostate 03/02/2022   Other specified counseling 03/02/2022   Renal calculi 03/02/2022   Gastroesophageal reflux disease 03/02/2022   Hypertensive disorder 03/02/2022   Mixed hyperlipidemia 03/02/2022   Hypothyroidism 03/02/2022   Acute non-recurrent frontal sinusitis 01/05/2022   Glands swollen 01/05/2022  Hypertension associated with diabetes (HCC) 01/05/2022   Acute rhinitis 01/05/2022   AV block, Mobitz 1    NSTEMI (non-ST elevated myocardial infarction) [facility])    ACS (acute coronary syndrome) (HCC) 12/16/2019   Diverticulitis of large intestine without perforation or abscess without bleeding    Pain of upper abdomen    SOB (shortness of breath)    S/P CABG (coronary artery bypass graft)    Back pain, chronic 07/28/2015   Benign fibroma of prostate 07/28/2015   Acid reflux 07/28/2015   Adult hypothyroidism 07/28/2015   Neuropathy 07/28/2015   Arthritis, degenerative 07/28/2015   Psoriasis 07/28/2015    Lumbar spondylosis 05/08/2015   Obesity 09/09/2014   Bilateral leg weakness 09/09/2014   Acute combined systolic and diastolic heart failure (HCC) 05/13/2014   Bilateral leg edema 04/15/2014   Bradycardia 08/13/2013   Edema 06/21/2012   Weakness 09/15/2011   Type 2 diabetes mellitus with diabetic peripheral angiopathy without gangrene, with long-term current use of insulin  (HCC) 09/15/2011   DYSPNEA 06/09/2009   Hyperlipidemia 06/07/2009   Essential hypertension 06/07/2009   Coronary artery disease 06/07/2009   ONSET DATE: Jan 2024  REFERRING DIAG:  R26.81 (ICD-10-CM) - Gait instability  R26.89 (ICD-10-CM) - Decreased mobility    THERAPY DIAG:  Difficulty in walking, not elsewhere classified - Plan: PT plan of care cert/re-cert  Abnormality of gait - Plan: PT plan of care cert/re-cert  Unsteadiness on feet - Plan: PT plan of care cert/re-cert  Muscle weakness (generalized) - Plan: PT plan of care cert/re-cert  Difficulty walking - Plan: PT plan of care cert/re-cert  Other lack of coordination - Plan: PT plan of care cert/re-cert  Imbalance due to old stroke - Plan: PT plan of care cert/re-cert  Rationale for Evaluation and Treatment: Rehabilitation  SUBJECTIVE:                                                                                                                                                                                          SUBJECTIVE STATEMENT:    Pt reports that he is expecting to go back to the eye surgeon at the end of November.  Pt feels as though his eyesight has improved, however it is still fuzzy.  Pt accompanied by: caregiver Devere  PERTINENT HISTORY: Patient reports having 2 CVA last year (1 CVA affecting right and 1 affecting left side). Prior to CVA he reports he was walking normally without an AD. States has been walking with walker ever since.   PAIN:  Are you having pain? No PRECAUTIONS: Fall WEIGHT BEARING RESTRICTIONS: No FALLS:  Has patient fallen in last 6 months? Yes.  Number of falls 1  LIVING ENVIRONMENT: Lives with: lives alone and has private  caregivers - everyday  and [facility] caregivers -3x/week - approx 8 hours/day Lives in: House/apartment Stairs: has ramp supplied by [employer] Has following equipment at home: Single point cane, Environmental Consultant - 2 wheeled, Wheelchair (manual), Shower bench, bed side commode, Grab bars, Ramped entry, and Transport w/c  PLOF: Independent  PATIENT GOALS: I want to walk without walker independently  OBJECTIVE:  Note: Objective measures were completed at Evaluation unless otherwise noted.  DIAGNOSTIC FINDINGS: CLINICAL DATA:  Stroke, follow up   EXAM: CT HEAD WITHOUT CONTRAST   TECHNIQUE: Contiguous axial images were obtained from the base of the skull through the vertex without intravenous contrast.   RADIATION DOSE REDUCTION: This exam was performed according to the departmental dose-optimization program which includes automated exposure control, adjustment of the mA and/or kV according to patient size and/or use of iterative reconstruction technique.   COMPARISON:  MR Brain 04/06/23, CT head 04/06/23   FINDINGS: Brain: No hemorrhage. No hydrocephalus. There are prominent subarachnoid spaces bilaterally, unchanged. There is sequela of moderate chronic microvascular ischemic change with a chronic infarct in the left corona radiata and an acute infarct in the right basal ganglia, better characterized on recent brain MRI.   Vascular: No hyperdense vessel or unexpected calcification.   Skull: Normal. Negative for fracture or focal lesion.   Sinuses/Orbits: No mastoid or middle ear effusion. Paranasal sinuses are clear. Bilateral lens replacement. Orbits are otherwise unremarkable.   Other: None.   IMPRESSION: 1. No acute intracranial hemorrhage. 2. Acute infarct in the right basal ganglia, better characterized on recent brain MRI    Electronically Signed   By: Lyndall Gore  M.D.   On: 04/07/2023 16:08  COGNITION: Overall cognitive status: Within functional limits for tasks assessed EDEMA:  Some edema noted with lower left LE  LOWER EXTREMITY MMT:    MMT Right Eval Left Eval  Hip flexion 4 4  Hip extension    Hip abduction    Hip adduction    Hip internal rotation 4 4  Hip external rotation 4 4  Knee flexion 4 4  Knee extension 4 4  Ankle dorsiflexion 4 4  Ankle plantarflexion    Ankle inversion    Ankle eversion    (Blank rows = not tested)  TRANSFERS: Assistive device utilized: Environmental Consultant - 2 wheeled  Sit to stand: CGA Stand to sit: CGA Chair to chair: CGA Floor: not tested  GAIT: Gait pattern: decreased step length- Right and decreased step length- Left Distance walked: > 100 feet  Assistive device utilized: Walker - 2 wheeled Level of assistance: CGA Comments: anterior trunk lean  FUNCTIONAL TESTS:  5 times sit to stand: 22.32 sec without UE support Timed up and go (TUG): 27.94 sec  6 minute walk test: To be assessed visit # 2 10 meter walk test: 16.10 and 14.92 sec with RW=15.51 sec avg or 0.65 m/s using RW  Berg Balance Scale: To be assessed visit #2  PATIENT SURVEYS:  Stroke Impact Scale 52 ABC scale 37.5 %  TREATMENT DATE: 09/19/24   Gait Training:   Circuit training performed with seated rest breaks while vitals were monitored in between bouts:    Overground ambulation, 160' without AD, close CGA with more erect posture and no LOB   Overground ambulation, 160' without AD, close CGA with more erect posture and 2 instances of LOB and modA to prevent uncontrolled descent   Overground ambulation, 100' without AD, close CGA and fatigue with pt requesting to sit in transport chair due to fatigue    Unless otherwise stated, CGA was provided and gait belt donned in order to ensure pt safety   Therapist  providing appropriate seated rest breaks throughout session.   Vitals assessed and HR maintained within 88-102 bpm, O2 saturation remained >92%      Self-Care Home Management:  Pt, caregiver, and therapist discussed current POC and lack of subsequent visits due to [employer] MD's not signing off on request for more visits and causing claims to be denied.  Pt informed that he may be at his last remaining visit based on current counts.  Pt also educated on the use of walker for ambulation attempts, however pt very adamant to walk without the walker and that's his primary goal.  Pt voiced displeasure with utilizing the walker, however unable to voice as to why he does not want to utilize it, other than it before slightly more cumbersome when transferring from chair to chair or bathroom.  Pt given demonstrations and also advised that pt was fatigued with ambulation just utilizing the walker, which meant his legs were still getting a good workout with use of the walker.  Pt still requested to ambulate without the need of the walker and the attempts were noted above.  Pt then stating he was not anticipating having to ambulate downtown so far.  Pt encouraged that blocked practice and longevity was important for improved cardiovascular performance and endurance levels within the LE's.     PATIENT EDUCATION: Education details: exercise technique Person educated: Patient,  Education method: Explanation Education comprehension: verbalized understanding  HOME EXERCISE PROGRAM:  Access Code: [email or web address] URL: [email or web address] Date: 01/25/2024 Prepared by: Connell Kiss  Exercises - Sit to Stand with Counter Support  - 1 x daily - 7 x weekly - 2 sets - 10 reps - Narrow Stance with Counter Support  - 1 x daily - 7 x weekly - 2 sets - 30 seconds hold - Narrow Stance with Head Nods and Counter Support  - 1 x daily - 7 x weekly - 2 sets - 10 reps  Access Code: [email or web address] URL:  [email or web address] Date: 05/15/2024 Prepared by: Marina  [name]  Exercises - Sit to Stand with Counter Support  - 1 x daily - 7 x weekly - 2 sets - 10 reps - Narrow Stance with Counter Support  - 1 x daily - 7 x weekly - 2 sets - 30 seconds hold - Narrow Stance with Head Nods and Counter Support  - 1 x daily - 7 x weekly - 2 sets - 10 reps - Standing March with Counter Support  - 1 x daily - 7 x weekly - 2 sets - 10 reps - 5 hold - Leg Extension  - 1 x daily - 7 x weekly - 2 sets - 10 reps - 5 hold - Seated Single Leg Hip Abduction with Resistance  - 1 x daily - 7 x weekly - 2 sets - 10 reps - 5 hold  GOALS: Goals reviewed with patient? Yes  SHORT TERM GOALS: Target date: 07/25/2024  Pt will be independent with HEP in order to improve strength and balance in order to decrease fall risk and improve function at home and work.  Baseline: EVAL: no formal HEP in place 4/8: Completing 5 days per week 06/13/2024: pt reports consistent compliance with HEP Goal status: MET  LONG TERM GOALS: Target date: 09/05/2024    1.  Patient (> 85 years old) will complete five times sit to stand test in < 15 seconds indicating an increased LE strength and improved balance. Baseline: EVAL: 22.32 sec without UE support 02/27/24: 22.81 sec no UE support;  03/21/2024= 16.17 sec without UE Support 06/06/24: 19.39 sec without UE support 09/03/24: 16.95 with UE support; 19.18 sec without UE support and one instance of instability Goal status: PROGRESSING  2.  Pt will improve ABC by at least 13% in order to demonstrate clinically significant improvement in balance confidence Baseline: EVAL: 37.5% 05/09/24: 70%, question validity compared to prior assessment.  06/06/24: 53.125% 09/03/24: TBD at subsequent visit Goal status: IN PROGRESS   3.  Patient will increase Berg Balance score by > 6 points to demonstrate decreased fall risk during functional activities. Baseline: 01/25/24: 25/56 02/27/24:  31 05/09/24: 33/56  06/11/24: 29/56 09/03/24: TBD at subsequent visit Goal status: IN PROGRESS   4.  Patient will reduce timed up and go to <11 seconds to reduce fall risk and demonstrate improved transfer/gait ability. Baseline: EVAL: 27.94 with RW 4/8: 35.58 sec with RW; 03/21/24= 26.88 sec 06/06/24: 34.06 sec 09/03/24: 43.70 sec Goal status: PROGRESSING  5.  Patient will increase 10 meter walk test to >1.22m/s as to improve gait speed for better community ambulation and to reduce fall risk. Baseline: EVAL: 0.65 m/s 02/27/24: .43 m/s with RW; 03/21/24= 0.71 m/s using RW 6/19: 0.1m/s using RW 06/06/24: 0.64 m/s with RW 09/03/24: 0.55 m/s with RW Goal status: PROGRESSING   6.  Patient will increase six minute walk test distance to >1000 for progression to community ambulator and improve gait ability Baseline: EVAL: To be assessed  visit #2 4/8: 600 ft; 03/21/24: 600 feet in 5 min using RW- stopped due to fatigue. 06/06/24: 438' using the RW 09/03/24: 430' using the RW and 3 standing rest breaks Goal status: PROGRESSING  7.  Patient will improve score on Stroke Impact Scale 16 by 8 points to signify a higher level of functioning and less difficulty with daily activities.  Baseline: EVAL: 52  02/27/24:55;  03/21/2024: 64 06/06/24: 66 09/03/24: TBD at subsequent visit Goal status: PROGRESSING   ASSESSMENT:  CLINICAL IMPRESSION:    Pt is making good progress, however still wants to be able to mobilize without the need of a walker, which does not seem feasible at this time outside of the clinic.  Pt is mobilizing better and making progress towards the goals, however still lacks the endurance or safety recognition to not attempt at home due to increased risk of falling.  Pt would likely benefit from performing activities in // bars as well, per the request of the pt if possible.  Pt also may be approaching the end of his certified dates and will need to be assessed before the subsequent visit.   Pt will  continue to benefit from skilled therapy to address remaining deficits in order to improve overall QoL and return to PLOF.    Pt advised to contact [facility] to see if a new referral could be sent or signed off on the current request.  OBJECTIVE IMPAIRMENTS: Abnormal gait, decreased activity tolerance, decreased balance, decreased endurance, decreased mobility, and decreased strength.   ACTIVITY LIMITATIONS: carrying, lifting, bending, standing, squatting, and stairs  PARTICIPATION LIMITATIONS: cleaning, laundry, driving, shopping, community activity, and yard work  PERSONAL FACTORS: Age and 3+ comorbidities: HTN, CHF, A-FIB, DM are also affecting patient's functional outcome.   REHAB POTENTIAL: Good  CLINICAL DECISION MAKING: Evolving/moderate complexity  EVALUATION COMPLEXITY: Moderate  PLAN:  PT FREQUENCY: 1-2x/week  PT DURATION: 12 weeks  PLANNED INTERVENTIONS: [id number]- PT Re-evaluation, 97110-Therapeutic exercises, 97530- Therapeutic activity, 97112- Neuromuscular re-education, 97535- Self Care, [id number]- Manual therapy, [phone]- Gait training, [phone]- Canalith repositioning, [phone]- Electrical stimulation (manual), Patient/Family education, Balance training, Stair training, Dry Needling, Joint mobilization, Vestibular training, DME instructions, Cryotherapy, and Moist heat  PLAN FOR NEXT SESSION:   - balance interventions including: narrow BOS, reaching/turning/rotating, eyes closed, and alternating foot taps - gait training without AD, encourage AD use also means independence (very reluctant to AD use)   Fonda Simpers, PT, DPT Physical Therapist - [facility]  09/19/24, 6:18 PM

## 2024-09-24 ENCOUNTER — Ambulatory Visit

## 2024-09-26 ENCOUNTER — Ambulatory Visit: Admitting: Physical Therapy

## 2024-10-03 ENCOUNTER — Ambulatory Visit: Admitting: Physical Therapy

## 2024-10-08 ENCOUNTER — Ambulatory Visit

## 2024-10-21 NOTE — Progress Notes (Deleted)
 Cardiology Office Note  Date:  10/21/2024   ID:  Joseph [name], DOB August 30, 1925, MRN [id number]  PCP:  Bertrum Charlie CROME, MD   No chief complaint on file.   HPI:  Mr. Waage is a 89 year old gentleman with a hx of  CAD, CABG in 1994,  PAD , followed by Dr.  Marea,  obesity,  hyperlipidemia,  arthritis of the knees,   fatigue, SOB, leg weakness,   lower extremity edema,  Carotid u/s  Less than 39% stenosis bilaterally AV block on beta-blockers Dilated cardiomyopathy ejection fraction 35 to 40% in June 2023 Ef 45 to 50% in may 2024 CVA in Jan 2024 presenting for routine followup of his coronary artery disease and  chronic diastolic CHF, persistent atrial fibrillation  Last seen in clinic by myself 5/25  In follow-up today he presents with caretaker Weight has been trending 213 or higher, slight worsening of his leg swelling left greater than right leg Compliant with torsemide  20 daily, sometimes taking extra torsemide  for weight over 213 pounds  weight 215 at home today Caretaker reports weight running over 213 a lot  Reports that he has a chair where he can raise his legs as he sits for much of the day High water intake  Denies chest pain concerning for angina  Lab work reviewed A1C coming down 8.9 down 7.8  EKG personally reviewed by myself on todays visit     Other past medical history reviewed  in the hospital May 2024 for acute stroke, weakness MRI in the emergency room confirming acute infarcts right posterior limb of the internal capsule, noted to have mild left sided facial droop  EKG showing atrial fibrillation Was concerned about starting anticoagulation given prior history of nosebleeds, previously seen by ENT  Case previously discussed with Dr. Cindie, felt not to be a candidate for Watchman device secondary to age  Labs 04/20/23 CR 1.2  Continues on torsemide  20 mg daily Weight at home 208 to 213  Trace lower extremity edema left  leg  Followed by Dr. Damian, Dr. Bertrum  loss of his wife 2022  Past medical history reviewed seen in [facility] emergency room Apr 18, 2022 Increased SOB,  Blood pressure markedly elevated 190/70 Troponin 22, BNP 308 Was in normal sinus rhythm Ended up leaving AMA with follow-up in our office  Other past medical history reviewed -cath 12/17/2019  Severe native CAD, including 80% distal LMCA, 100% proximal LAD and diffuse LCx/OM disease.  90% mid RCA stenosis and CTO of rPDA also present. Patent LIMA->D3 with backfilling of the mid/distal LADl. Patent SVG-D1-D2, SVG-OM1-OM2, and SVG-rPDA-rPL. Mildly to moderately elevated LVEDP. At least moderately reduced left ventricular systolic function.  Echo 12/17/2019  1. Left ventricular ejection fraction, by visual estimation, is 50 to  55%. The left ventricle has low normal function. There is mildly increased  left ventricular hypertrophy.  Other past medical history reviewed  headache 02/03/2017,  Seen in th ER for H/A 02/03/17 Confused,  In the emergency room had CT head, Showing atherosclerotic calcification of the cavernous carotid arteries bilaterally. Lab work within normal limits No other focal neurologic deficits Patient and family thinks he had a TIA  admission May 2017 for acute cholecystitis, developing acute respiratory distress with hypoxia and acute renal disease, peak creatinine of 2. Notes indicating HIDA scan positive for acute cholecystitis. s/p cholecystostomy tube placed in through interventional radiology 03/24/16 Diuretic was discontinued in the setting of acute renal failure He does report greater than 20 pound  weight loss   Echocardiogram 03/22/2016 reviewed with him, ejection fraction 50-55% Chest x-ray 03/29/2016 with small right pleural effusion   Ultrasound of the legs showing patent right SFA stent, left side CFA and popliteal artery with good flow, waveform deterioration around the ankle on the left    Last Cardiac catheterization was performed at [facility] 08/16/2012 Catheterization showed severe three-vessel coronary artery disease with patent grafts x4. There was 80% disease of a small to moderate sized distal left circumflex. Ejection fraction 45-50%. No significant aortic valve stenosis or mitral valve regurgitation. The findings were discussed with interventional cardiology and medical management was recommended for the distal left circumflex disease.   PMH:   has a past medical history of Acute respiratory failure with hypoxia (HCC) (03/21/2016), Atrial fibrillation (HCC) (03/22/2023), CAD (coronary artery disease), Chronic diastolic CHF (congestive heart failure) (HCC), Diabetes mellitus, type 2 (HCC), HTN (hypertension), Hyperlipidemia, Hypothyroidism, Lower extremity edema, Spinal stenosis, and Stroke (HCC).  PSH:    Past Surgical History:  Procedure Laterality Date   APPENDECTOMY     CARDIAC CATHETERIZATION  07/2012   [facility]/no stents   CHOLECYSTECTOMY     CORNEAL TRANSPLANT     CORONARY ARTERY BYPASS GRAFT  1994   HEMORRHOID SURGERY     LEFT HEART CATH AND CORS/GRAFTS ANGIOGRAPHY N/A 12/17/2019   Procedure: LEFT HEART CATH AND CORS/GRAFTS ANGIOGRAPHY;  Surgeon: Mady Bruckner, MD;  Location: [facility] INVASIVE CV LAB;  Service: Cardiovascular;  Laterality: N/A;   PROSTATE BIOPSY     TOE SURGERY     ingrown toe nail   Current Outpatient Medications on File Prior to Visit  Medication Sig Dispense Refill   apixaban  (ELIQUIS ) 2.5 MG TABS tablet Take 2.5 mg by mouth 2 (two) times daily.     atorvastatin  (LIPITOR) 80 MG tablet Take 1 tablet (80 mg total) by mouth daily. 30 tablet 0   Cholecalciferol  50 MCG (2000 UT) CAPS Take 5,000 Units by mouth.     fenofibrate  54 MG tablet Take 1 tablet (54 mg total) by mouth daily. 30 tablet 0   gabapentin  (NEURONTIN ) 100 MG capsule Take 300 mg by mouth at bedtime.     Infant Care Products [facility]) OINT Apply 1 application  topically daily as  needed. Apply to peri area topically every shift for skin protectant     insulin  aspart (NOVOLOG ) 100 UNIT/ML injection Inject 20 units once daily in the afternoon. Inject 25 units once daily in the eveing. Inject 25 units once daily. 10 mL 0   insulin  glargine (LANTUS ) 100 UNIT/ML injection Inject 0.4 mLs (40 Units total) into the skin daily. 10 mL 0   KLOR-CON  M10 10 MEQ tablet Take 20 mEq by mouth daily.     levothyroxine  (SYNTHROID ) 50 MCG tablet Take 1 tablet (50 mcg total) by mouth daily before breakfast. 30 tablet 0   losartan  (COZAAR ) 100 MG tablet Take 1 tablet (100 mg total) by mouth daily. 90 tablet 2   nitroGLYCERIN  (NITROSTAT ) 0.4 MG SL tablet Place 1 tablet (0.4 mg total) under the tongue every 5 (five) minutes as needed for chest pain. 25 tablet 0   omeprazole  (PRILOSEC) 20 MG capsule Take 1 capsule (20 mg total) by mouth daily. 30 capsule 0   oxymetazoline  (AFRIN) 0.05 % nasal spray Place 2 sprays into both nostrils once for nose bleed, seek medical attention if bleeding persists after 2 sprays, do not repeat dose 6 mL 0   potassium chloride  (KLOR-CON ) 10 MEQ tablet Take 1 tablet (10  mEq total) by mouth daily. 30 tablet 0   rOPINIRole  (REQUIP ) 0.5 MG tablet Take 1 tablet (0.5 mg total) by mouth 2 (two) times daily. 60 tablet 0   sodium chloride  (MURO 128) 5 % ophthalmic ointment SMARTSIG:sparingly Right Eye Every Night     torsemide  (DEMADEX ) 20 MG tablet Take 1 tablet (20 mg total) by mouth daily. Take an extra tablet ( 20 mg) once daily after lunch for weight 213 lb or higher. 180 tablet 2   Zinc Oxide (TRIPLE PASTE) 12.8 % ointment Apply 1 Application topically as needed for irritation. Every shift.     No current facility-administered medications on file prior to visit.     Allergies:   Farxiga  [dapagliflozin ]   Social History:  The patient  reports that he quit smoking about 49 years ago. His smoking use included pipe, cigars, and cigarettes. He has never used smokeless  tobacco. He reports that he does not drink alcohol and does not use drugs.   Family History:   family history includes Heart attack in his father; Heart failure in his maternal grandfather, maternal uncle, and mother; Kidney Stones in his son; Sleep apnea in his daughter and son; Stroke in his brother.    Review of Systems: Review of Systems  Constitutional: Negative.        Gait instability  HENT: Negative.    Respiratory: Negative.    Cardiovascular: Negative.   Gastrointestinal: Negative.   Musculoskeletal: Negative.   Neurological: Negative.   Psychiatric/Behavioral: Negative.    All other systems reviewed and are negative.  PHYSICAL EXAM: VS:  There were no vitals taken for this visit. , BMI There is no height or weight on file to calculate BMI. Constitutional:  oriented to person, place, and time. No distress.  HENT:  Head: Grossly normal Eyes:  no discharge. No scleral icterus.  Neck: No JVD, no carotid bruits  Cardiovascular: Regular rate and rhythm, no murmurs appreciated Pulmonary/Chest: Clear to auscultation bilaterally, no wheezes or rales Abdominal: Soft.  no distension.  no tenderness.  Musculoskeletal: Normal range of motion Neurological:  normal muscle tone. Coordination normal. No atrophy Skin: Skin warm and dry Psychiatric: normal affect, pleasant  Recent Labs: No results found for requested labs within last 365 days.    Lipid Panel Lab Results  Component Value Date   CHOL 126 04/07/2023   HDL 28 (L) 04/07/2023   LDLCALC 70 04/07/2023   TRIG 138 04/07/2023      Wt Readings from Last 3 Encounters:  03/22/24 213 lb (96.6 kg)  07/09/23 209 lb (94.8 kg)  06/02/23 208 lb 8 oz (94.6 kg)     ASSESSMENT AND PLAN:   Persistent atrial fibrillation History of stroke x 2 January and May 2024 Rate well-controlled  chronic nosebleeds Not a candidate for watchman based on age Tolerating Eliquis  2.5 twice daily  Ischemic cardiomyopathy Prior  catheterization 2001 with severe disease, medical management recommended Previously declined cardiac catheterization Tolerating losartan  and torsemide  Not on beta-blocker secondary to bradycardia Unable to afford Entresto, Farxiga  Previously stopped spironolactone   Mixed hyperlipidemia -  Cholesterol is at goal on the current lipid regimen. No changes to the medications were made.  HYPERTENSION, BENIGN -  Recommend close monitoring of blood pressure at home If blood pressure continues to run high could restart spironolactone   Atrial tachycardia Beta-blocker held for AV block Remains in atrial fibrillation rate well-controlled  Atherosclerosis of coronary artery bypass graft of native heart with stable angina pectoris (HCC) -  Catheterization 2021 detailing patent grafts declined repeat catheterization  Diabetes mellitus with peripheral vascular disease (HCC) -  A1c 8.9 down to 7.8 Followed by endocrinology  Diastolic CHF, acute on chronic (HCC) -  Regular recommended he continue torsemide  20 daily with extra torsemide  for weight over 213 pounds Potassium 20 daily  Morbid obesity due to excess calories (HCC) -  Calorie restriction recommended  Leg edema Left greater than right, chronic issue, vein donor leg Continue torsemide  daily with extra as needed Water intake.  Does not like wearing compression hose Stable renal function  PAD (peripheral artery disease) (HCC) -  Minimal disease carotid in 2018 Cholesterol at goal     No orders of the defined types were placed in this encounter.    Signed, Velinda Lunger, M.D., Ph.D. 10/21/2024  [facility] [place], [place] [phone]

## 2024-10-22 ENCOUNTER — Ambulatory Visit: Admitting: Cardiovascular Disease

## 2024-10-22 DIAGNOSIS — I10 Essential (primary) hypertension: Secondary | ICD-10-CM

## 2024-10-22 DIAGNOSIS — I5042 Chronic combined systolic (congestive) and diastolic (congestive) heart failure: Secondary | ICD-10-CM

## 2024-10-22 DIAGNOSIS — I25118 Atherosclerotic heart disease of native coronary artery with other forms of angina pectoris: Secondary | ICD-10-CM

## 2024-10-22 DIAGNOSIS — I4821 Permanent atrial fibrillation: Secondary | ICD-10-CM

## 2024-10-22 DIAGNOSIS — I5022 Chronic systolic (congestive) heart failure: Secondary | ICD-10-CM

## 2024-10-22 DIAGNOSIS — I480 Paroxysmal atrial fibrillation: Secondary | ICD-10-CM

## 2024-10-22 DIAGNOSIS — E782 Mixed hyperlipidemia: Secondary | ICD-10-CM

## 2024-10-22 DIAGNOSIS — E1151 Type 2 diabetes mellitus with diabetic peripheral angiopathy without gangrene: Secondary | ICD-10-CM

## 2024-10-22 DIAGNOSIS — E1159 Type 2 diabetes mellitus with other circulatory complications: Secondary | ICD-10-CM

## 2024-12-09 NOTE — Progress Notes (Unsigned)
 Cardiology Office Note  Date:  12/10/2024   ID:  Vedansh, Kerstetter May 09, 1925, MRN [id number]  PCP:  Bertrum Charlie CROME, MD   Chief Complaint  Patient presents with   6 month follow up     Patient c/o bilateral LE edema with more on the left.     HPI:  Mr. Vanderlinde is a 89 year old gentleman with a hx of  CAD, CABG in 1994,  PAD , followed by Dr.  Marea,  obesity,  hyperlipidemia,  arthritis of the knees,   fatigue, SOB, leg weakness,   lower extremity edema,  Carotid u/s  Less than 39% stenosis bilaterally AV block on beta-blockers Dilated cardiomyopathy ejection fraction 35 to 40% in June 2023 Ef 45 to 50% in may 2024 CVA in Jan 2024 presenting for routine followup of his coronary artery disease and  chronic diastolic CHF, persistent atrial fibrillation  Last seen in clinic by myself 5/25 Presents with caretaker Caretaker and patient report worsening leg swelling, legs are pink, difficult to get shoes on, swelling worse than his baseline bilaterally High fluid intake, reports compliance with his torsemide  20 daily Lab work 2 weeks ago stable renal function creatinine 1.4 with low potassium 3.5 He reports having periodic cramping in muscles lower extremities  Weight reportedly stable 213 pounds Does not wear compression hose Not on calcium  channel blockers  No significant chest pain concerning for angina   Lab work reviewed A1C coming down  7.8 Creatinine 1.4  EKG personally reviewed by myself on todays visit EKG Interpretation Date/Time:  Tuesday December 10 2024 10:54:01 EST Ventricular Rate:  76 PR Interval:  194 QRS Duration:  124 QT Interval:  444 QTC Calculation: 499 R Axis:   -40  Text Interpretation: Normal sinus rhythm Left axis deviation Left bundle branch block When compared with ECG of 22-Mar-2024 11:19, Sinus rhythm has replaced Atrial fibrillation QT has lengthened Confirmed by Perla Lye [phone]) on 12/10/2024 11:24:21 AM   Other past  medical history reviewed  in the hospital May 2024 for acute stroke, weakness MRI in the emergency room confirming acute infarcts right posterior limb of the internal capsule, noted to have mild left sided facial droop  EKG showing atrial fibrillation Was concerned about starting anticoagulation given prior history of nosebleeds, previously seen by ENT  Case previously discussed with Dr. Cindie, felt not to be a candidate for Watchman device secondary to age  Continues on torsemide  20 mg daily Weight at home 208 to 213  Trace lower extremity edema left leg  Followed by Dr. Damian, Dr. Bertrum  loss of his wife 2022  Past medical history reviewed seen in [facility] emergency room Apr 18, 2022 Increased SOB,  Blood pressure markedly elevated 190/70 Troponin 22, BNP 308 Was in normal sinus rhythm Ended up leaving AMA with follow-up in our office  Other past medical history reviewed -cath 12/17/2019  Severe native CAD, including 80% distal LMCA, 100% proximal LAD and diffuse LCx/OM disease.  90% mid RCA stenosis and CTO of rPDA also present. Patent LIMA->D3 with backfilling of the mid/distal LADl. Patent SVG-D1-D2, SVG-OM1-OM2, and SVG-rPDA-rPL. Mildly to moderately elevated LVEDP. At least moderately reduced left ventricular systolic function.  Echo 12/17/2019  1. Left ventricular ejection fraction, by visual estimation, is 50 to  55%. The left ventricle has low normal function. There is mildly increased  left ventricular hypertrophy.  Other past medical history reviewed  headache 02/03/2017,  Seen in th ER for H/A 02/03/17 Confused,  In the emergency  room had CT head, Showing atherosclerotic calcification of the cavernous carotid arteries bilaterally. Lab work within normal limits No other focal neurologic deficits Patient and family thinks he had a TIA  admission May 2017 for acute cholecystitis, developing acute respiratory distress with hypoxia and acute renal disease, peak  creatinine of 2. Notes indicating HIDA scan positive for acute cholecystitis. s/p cholecystostomy tube placed in through interventional radiology 03/24/16 Diuretic was discontinued in the setting of acute renal failure He does report greater than 20 pound weight loss   Echocardiogram 03/22/2016 reviewed with him, ejection fraction 50-55% Chest x-ray 03/29/2016 with small right pleural effusion   Ultrasound of the legs showing patent right SFA stent, left side CFA and popliteal artery with good flow, waveform deterioration around the ankle on the left   Last Cardiac catheterization was performed at [facility] 08/16/2012 Catheterization showed severe three-vessel coronary artery disease with patent grafts x4. There was 80% disease of a small to moderate sized distal left circumflex. Ejection fraction 45-50%. No significant aortic valve stenosis or mitral valve regurgitation. The findings were discussed with interventional cardiology and medical management was recommended for the distal left circumflex disease.   PMH:   has a past medical history of Acute respiratory failure with hypoxia (HCC) (03/21/2016), Atrial fibrillation (HCC) (03/22/2023), CAD (coronary artery disease), Chronic diastolic CHF (congestive heart failure) (HCC), Diabetes mellitus, type 2 (HCC), HTN (hypertension), Hyperlipidemia, Hypothyroidism, Lower extremity edema, Spinal stenosis, and Stroke (HCC).  PSH:    Past Surgical History:  Procedure Laterality Date   APPENDECTOMY     CARDIAC CATHETERIZATION  07/2012   [facility]/no stents   CHOLECYSTECTOMY     CORNEAL TRANSPLANT     CORONARY ARTERY BYPASS GRAFT  1994   HEMORRHOID SURGERY     LEFT HEART CATH AND CORS/GRAFTS ANGIOGRAPHY N/A 12/17/2019   Procedure: LEFT HEART CATH AND CORS/GRAFTS ANGIOGRAPHY;  Surgeon: Mady Bruckner, MD;  Location: [facility] INVASIVE CV LAB;  Service: Cardiovascular;  Laterality: N/A;   PROSTATE BIOPSY     TOE SURGERY     ingrown toe nail   Current Outpatient  Medications on File Prior to Visit  Medication Sig Dispense Refill   apixaban  (ELIQUIS ) 2.5 MG TABS tablet Take 2.5 mg by mouth 2 (two) times daily.     atorvastatin  (LIPITOR) 80 MG tablet Take 1 tablet (80 mg total) by mouth daily. 30 tablet 0   Cholecalciferol  50 MCG (2000 UT) CAPS Take 5,000 Units by mouth.     fenofibrate  54 MG tablet Take 1 tablet (54 mg total) by mouth daily. 30 tablet 0   gabapentin  (NEURONTIN ) 100 MG capsule Take 300 mg by mouth at bedtime.     Infant Care Products [facility]) OINT Apply 1 application  topically daily as needed. Apply to peri area topically every shift for skin protectant     insulin  aspart (NOVOLOG ) 100 UNIT/ML injection Inject 20 units once daily in the afternoon. Inject 25 units once daily in the eveing. Inject 25 units once daily. 10 mL 0   insulin  glargine (LANTUS ) 100 UNIT/ML injection Inject 0.4 mLs (40 Units total) into the skin daily. 10 mL 0   KLOR-CON  M10 10 MEQ tablet Take 20 mEq by mouth daily.     levothyroxine  (SYNTHROID ) 50 MCG tablet Take 1 tablet (50 mcg total) by mouth daily before breakfast. 30 tablet 0   losartan  (COZAAR ) 100 MG tablet Take 1 tablet (100 mg total) by mouth daily. 90 tablet 2   nitroGLYCERIN  (NITROSTAT ) 0.4 MG SL tablet  Place 1 tablet (0.4 mg total) under the tongue every 5 (five) minutes as needed for chest pain. 25 tablet 0   omeprazole  (PRILOSEC) 20 MG capsule Take 1 capsule (20 mg total) by mouth daily. 30 capsule 0   oxymetazoline  (AFRIN) 0.05 % nasal spray Place 2 sprays into both nostrils once for nose bleed, seek medical attention if bleeding persists after 2 sprays, do not repeat dose 6 mL 0   rOPINIRole  (REQUIP ) 0.5 MG tablet Take 1 tablet (0.5 mg total) by mouth 2 (two) times daily. 60 tablet 0   sodium chloride  (MURO 128) 5 % ophthalmic ointment SMARTSIG:sparingly Right Eye Every Night     torsemide  (DEMADEX ) 20 MG tablet Take 1 tablet (20 mg total) by mouth daily. Take an extra tablet ( 20 mg) once daily  after lunch for weight 213 lb or higher. 180 tablet 2   Zinc Oxide (TRIPLE PASTE) 12.8 % ointment Apply 1 Application topically as needed for irritation. Every shift.     No current facility-administered medications on file prior to visit.     Allergies:   Farxiga  [email or web address] ]   Social History:  The patient  reports that he quit smoking about 50 years ago. His smoking use included pipe, cigars, and cigarettes. He has never used smokeless tobacco. He reports that he does not drink alcohol and does not use drugs.   Family History:   family history includes Heart attack in his father; Heart failure in his maternal grandfather, maternal uncle, and mother; Kidney Stones in his son; Sleep apnea in his daughter and son; Stroke in his brother.    Review of Systems: Review of Systems  Constitutional: Negative.        Gait instability  HENT: Negative.    Respiratory: Negative.    Cardiovascular:  Positive for leg swelling.  Gastrointestinal: Negative.   Musculoskeletal: Negative.   Neurological: Negative.   Psychiatric/Behavioral: Negative.    All other systems reviewed and are negative.  PHYSICAL EXAM: VS:  BP (!) 140/58 (BP Location: Left Arm, Patient Position: Sitting, Cuff Size: Normal)   Pulse 76   Ht 5' 7 (1.702 m)   Wt 213 lb (96.6 kg)   SpO2 96%   BMI 33.36 kg/m  , BMI Body mass index is 33.36 kg/m. Constitutional:  oriented to person, place, and time. No distress.  HENT:  Head: Grossly normal Eyes:  no discharge. No scleral icterus.  Neck: No JVD, no carotid bruits  Cardiovascular: Regular rate and rhythm, no murmurs appreciated, 2+ pitting lower extremity edema up to below the knees Pulmonary/Chest: Clear to auscultation bilaterally, no wheezes or rales Abdominal: Soft.  no distension.  no tenderness.  Musculoskeletal: Normal range of motion Neurological:  normal muscle tone. Coordination normal. No atrophy Skin: Skin warm and dry Psychiatric: normal affect,  pleasant  Recent Labs: No results found for requested labs within last 365 days.    Lipid Panel Lab Results  Component Value Date   CHOL 126 04/07/2023   HDL 28 (L) 04/07/2023   LDLCALC 70 04/07/2023   TRIG 138 04/07/2023      Wt Readings from Last 3 Encounters:  12/10/24 213 lb (96.6 kg)  03/22/24 213 lb (96.6 kg)  07/09/23 209 lb (94.8 kg)     ASSESSMENT AND PLAN:   Persistent atrial fibrillation History of stroke x 2 January and May 2024 Rate well-controlled  chronic nosebleeds Not a candidate for watchman based on age Tolerating Eliquis  2.5 twice daily Rate well-controlled  Ischemic  cardiomyopathy Prior catheterization 2001 with severe disease, medical management recommended Previously declined cardiac catheterization Tolerating losartan  and torsemide  Not on beta-blocker secondary to bradycardia Unable to afford Entresto, Farxiga  Previously stopped spironolactone   Mixed hyperlipidemia -  Cholesterol is at goal on the current lipid regimen. No changes to the medications were made.  HYPERTENSION, BENIGN -  Recommend close monitoring of blood pressure at home If blood pressure continues to run high could restart spironolactone   Atrial tachycardia Beta-blocker held for AV block EKG today remains atrial fibrillation  Atherosclerosis of coronary artery bypass graft of native heart with stable angina pectoris [facility]) -  Catheterization 2021 detailing patent grafts declined repeat catheterization - Denies anginal symptoms  Diabetes mellitus with peripheral vascular disease (HCC) -  A1c  7.8 Followed by endocrinology  Diastolic CHF, acute on chronic (HCC) -  Worsening leg swelling, changed to torsemide  as below  Morbid obesity due to excess calories (HCC) -  Suspect weight trending downward after diuresis as detailed below  Leg edema Recommend torsemide  40 in the morning for the next week with potassium 40 daily Then suggest he call us  with his weight,  caretaker can report if his leg swelling has improved on higher dose torsemide  - Suspect we may need to continue torsemide  40 daily or torsemide  40 alternating with 20 every other day - Will need higher dose potassium given hypokalemia on recent lab work  PAD (peripheral artery disease) (HCC) -  Minimal disease carotid in 2018 Cholesterol at goal     Orders Placed This Encounter  Procedures   EKG 12-Lead     Signed, Velinda Lunger, M.D., Ph.D. 12/10/2024  [facility] [place], [place] [phone]

## 2024-12-10 ENCOUNTER — Ambulatory Visit: Attending: Cardiovascular Disease | Admitting: Cardiovascular Disease

## 2024-12-10 ENCOUNTER — Encounter: Payer: Self-pay | Admitting: Cardiovascular Disease

## 2024-12-10 VITALS — BP 140/58 | HR 76 | Ht 67.0 in | Wt 213.0 lb

## 2024-12-10 DIAGNOSIS — I5032 Chronic diastolic (congestive) heart failure: Secondary | ICD-10-CM

## 2024-12-10 DIAGNOSIS — E1159 Type 2 diabetes mellitus with other circulatory complications: Secondary | ICD-10-CM | POA: Diagnosis not present

## 2024-12-10 DIAGNOSIS — I152 Hypertension secondary to endocrine disorders: Secondary | ICD-10-CM | POA: Diagnosis not present

## 2024-12-10 DIAGNOSIS — I4821 Permanent atrial fibrillation: Secondary | ICD-10-CM | POA: Diagnosis not present

## 2024-12-10 DIAGNOSIS — I10 Essential (primary) hypertension: Secondary | ICD-10-CM | POA: Diagnosis not present

## 2024-12-10 DIAGNOSIS — I6310 Cerebral infarction due to embolism of unspecified precerebral artery: Secondary | ICD-10-CM

## 2024-12-10 DIAGNOSIS — E782 Mixed hyperlipidemia: Secondary | ICD-10-CM | POA: Diagnosis not present

## 2024-12-10 DIAGNOSIS — I5042 Chronic combined systolic (congestive) and diastolic (congestive) heart failure: Secondary | ICD-10-CM

## 2024-12-10 DIAGNOSIS — I639 Cerebral infarction, unspecified: Secondary | ICD-10-CM

## 2024-12-10 MED ORDER — TORSEMIDE 20 MG PO TABS: 20.0000 mg | ORAL_TABLET | Freq: Every day | ORAL | 2 refills | Status: AC

## 2024-12-10 MED ORDER — KLOR-CON M10 10 MEQ PO TBCR: 30.0000 meq | EXTENDED_RELEASE_TABLET | Freq: Every day | ORAL | 2 refills | Status: AC

## 2024-12-10 NOTE — Patient Instructions (Addendum)
 Medication Instructions:   Today take extra Torsemide  and 2 Potassium when you get home.   Please increase the torsemide  up to 40 mg every morning for 1 weeks  Take with potassium 40 meq  Weight daily, call office and let us  know if swelling and weight are better  Then back to torsemide  20 daily with potassium 30 meq daily  Colace daily  If you need a refill on your cardiac medications before your next appointment, please call your pharmacy.   Lab work: No new labs needed  Testing/Procedures: No new testing needed  Follow-Up: At [facility], you and your health needs are our priority.  As part of our continuing mission to provide you with exceptional heart care, we have created designated Provider Care Teams.  These Care Teams include your primary Cardiologist (physician) and Advanced Practice Providers (APPs -  Physician Assistants and Nurse Practitioners) who all work together to provide you with the care you need, when you need it.  You will need a follow up appointment in 1 month, app ok  Providers on your designated Care Team:   Lonni Meager, NP Bernardino Bring, PA-C [place], [place]  COVID-19 Vaccine Information can be found at: [email or web address] For questions related to vaccine distribution or appointments, please email [email or web address] or call [phone].

## 2024-12-20 ENCOUNTER — Telehealth: Payer: Self-pay | Admitting: Cardiovascular Disease

## 2024-12-20 NOTE — Telephone Encounter (Signed)
 Pt was told to track his daily weight.  1/21-209.5 1/22-210 1/23-209.8 1/24-210.4 1/25-210.5 1/27-209.8 1/28-210.4 1/29-209.8 Today-212.2

## 2025-01-16 ENCOUNTER — Ambulatory Visit: Admitting: Medical
